# Patient Record
Sex: Female | Born: 1955
Health system: Southern US, Community
[De-identification: ages and names within clinical notes are randomized; demographics above are authoritative.]

## PROBLEM LIST (undated history)

## (undated) DIAGNOSIS — E079 Disorder of thyroid, unspecified: Secondary | ICD-10-CM

## (undated) DIAGNOSIS — K900 Celiac disease: Secondary | ICD-10-CM

## (undated) DIAGNOSIS — C50919 Malignant neoplasm of unspecified site of unspecified female breast: Secondary | ICD-10-CM

## (undated) DIAGNOSIS — Z9221 Personal history of antineoplastic chemotherapy: Secondary | ICD-10-CM

## (undated) DIAGNOSIS — R32 Unspecified urinary incontinence: Secondary | ICD-10-CM

## (undated) DIAGNOSIS — K5730 Diverticulosis of large intestine without perforation or abscess without bleeding: Secondary | ICD-10-CM

## (undated) DIAGNOSIS — K644 Residual hemorrhoidal skin tags: Secondary | ICD-10-CM

## (undated) DIAGNOSIS — F32A Depression, unspecified: Secondary | ICD-10-CM

## (undated) DIAGNOSIS — E039 Hypothyroidism, unspecified: Secondary | ICD-10-CM

## (undated) DIAGNOSIS — K901 Tropical sprue: Secondary | ICD-10-CM

## (undated) DIAGNOSIS — G629 Polyneuropathy, unspecified: Secondary | ICD-10-CM

## (undated) DIAGNOSIS — Z973 Presence of spectacles and contact lenses: Secondary | ICD-10-CM

## (undated) DIAGNOSIS — F419 Anxiety disorder, unspecified: Secondary | ICD-10-CM

## (undated) DIAGNOSIS — J189 Pneumonia, unspecified organism: Secondary | ICD-10-CM

## (undated) DIAGNOSIS — I82409 Acute embolism and thrombosis of unspecified deep veins of unspecified lower extremity: Secondary | ICD-10-CM

## (undated) DIAGNOSIS — F329 Major depressive disorder, single episode, unspecified: Secondary | ICD-10-CM

## (undated) DIAGNOSIS — E559 Vitamin D deficiency, unspecified: Secondary | ICD-10-CM

## (undated) HISTORY — DX: Diverticulosis of large intestine without perforation or abscess without bleeding: K57.30

## (undated) HISTORY — DX: Celiac disease: K90.0

## (undated) HISTORY — DX: Malignant neoplasm of unspecified site of unspecified female breast: C50.919

## (undated) HISTORY — DX: Disorder of thyroid, unspecified: E07.9

## (undated) HISTORY — DX: Acute embolism and thrombosis of unspecified deep veins of unspecified lower extremity: I82.409

## (undated) HISTORY — DX: Residual hemorrhoidal skin tags: K64.4

## (undated) HISTORY — PX: OTHER SURGICAL HISTORY: SHX169

## (undated) HISTORY — PX: BREAST LUMPECTOMY: SHX2

## (undated) HISTORY — DX: Depression, unspecified: F32.A

## (undated) HISTORY — DX: Tropical sprue: K90.1

## (undated) HISTORY — DX: Major depressive disorder, single episode, unspecified: F32.9

---

## 1998-04-15 ENCOUNTER — Ambulatory Visit (HOSPITAL_COMMUNITY): Admission: RE | Admit: 1998-04-15 | Discharge: 1998-04-15 | Payer: Self-pay | Admitting: Gynecology

## 1998-10-25 ENCOUNTER — Ambulatory Visit (HOSPITAL_COMMUNITY): Admission: RE | Admit: 1998-10-25 | Discharge: 1998-10-25 | Payer: Self-pay | Admitting: Internal Medicine

## 1998-10-25 ENCOUNTER — Encounter: Payer: Self-pay | Admitting: Internal Medicine

## 1999-12-12 ENCOUNTER — Ambulatory Visit (HOSPITAL_COMMUNITY): Admission: RE | Admit: 1999-12-12 | Discharge: 1999-12-12 | Payer: Self-pay | Admitting: *Deleted

## 1999-12-12 ENCOUNTER — Encounter: Payer: Self-pay | Admitting: *Deleted

## 2000-11-29 ENCOUNTER — Encounter: Admission: RE | Admit: 2000-11-29 | Discharge: 2000-11-29 | Payer: Self-pay | Admitting: Family Medicine

## 2000-11-29 ENCOUNTER — Encounter: Payer: Self-pay | Admitting: Family Medicine

## 2001-01-14 ENCOUNTER — Encounter: Admission: RE | Admit: 2001-01-14 | Discharge: 2001-01-14 | Payer: Self-pay | Admitting: Internal Medicine

## 2001-01-14 ENCOUNTER — Encounter: Payer: Self-pay | Admitting: Internal Medicine

## 2001-07-02 ENCOUNTER — Encounter: Payer: Self-pay | Admitting: Internal Medicine

## 2001-07-02 ENCOUNTER — Encounter: Admission: RE | Admit: 2001-07-02 | Discharge: 2001-07-02 | Payer: Self-pay | Admitting: Internal Medicine

## 2001-07-25 ENCOUNTER — Encounter: Admission: RE | Admit: 2001-07-25 | Discharge: 2001-07-25 | Payer: Self-pay | Admitting: Internal Medicine

## 2001-07-25 ENCOUNTER — Encounter: Payer: Self-pay | Admitting: Internal Medicine

## 2001-07-25 ENCOUNTER — Other Ambulatory Visit: Admission: RE | Admit: 2001-07-25 | Discharge: 2001-07-25 | Payer: Self-pay | Admitting: *Deleted

## 2001-07-25 ENCOUNTER — Encounter (INDEPENDENT_AMBULATORY_CARE_PROVIDER_SITE_OTHER): Payer: Self-pay

## 2001-08-18 ENCOUNTER — Encounter (INDEPENDENT_AMBULATORY_CARE_PROVIDER_SITE_OTHER): Payer: Self-pay | Admitting: *Deleted

## 2001-08-18 ENCOUNTER — Encounter: Payer: Self-pay | Admitting: Internal Medicine

## 2001-08-18 ENCOUNTER — Encounter: Admission: RE | Admit: 2001-08-18 | Discharge: 2001-08-18 | Payer: Self-pay | Admitting: Internal Medicine

## 2001-08-18 ENCOUNTER — Other Ambulatory Visit: Admission: RE | Admit: 2001-08-18 | Discharge: 2001-08-18 | Payer: Self-pay | Admitting: *Deleted

## 2001-09-15 ENCOUNTER — Encounter: Payer: Self-pay | Admitting: Internal Medicine

## 2001-09-15 ENCOUNTER — Encounter: Admission: RE | Admit: 2001-09-15 | Discharge: 2001-09-15 | Payer: Self-pay | Admitting: Internal Medicine

## 2002-01-15 ENCOUNTER — Encounter: Payer: Self-pay | Admitting: Internal Medicine

## 2002-01-15 ENCOUNTER — Encounter: Admission: RE | Admit: 2002-01-15 | Discharge: 2002-01-15 | Payer: Self-pay | Admitting: Internal Medicine

## 2002-04-23 ENCOUNTER — Other Ambulatory Visit: Admission: RE | Admit: 2002-04-23 | Discharge: 2002-04-23 | Payer: Self-pay | Admitting: Gynecology

## 2003-03-12 ENCOUNTER — Encounter (INDEPENDENT_AMBULATORY_CARE_PROVIDER_SITE_OTHER): Payer: Self-pay

## 2003-03-12 ENCOUNTER — Ambulatory Visit (HOSPITAL_COMMUNITY): Admission: AD | Admit: 2003-03-12 | Discharge: 2003-03-12 | Payer: Self-pay | Admitting: Obstetrics and Gynecology

## 2003-04-20 ENCOUNTER — Encounter: Payer: Self-pay | Admitting: Internal Medicine

## 2003-04-20 ENCOUNTER — Encounter: Admission: RE | Admit: 2003-04-20 | Discharge: 2003-04-20 | Payer: Self-pay | Admitting: Internal Medicine

## 2003-05-04 ENCOUNTER — Other Ambulatory Visit: Admission: RE | Admit: 2003-05-04 | Discharge: 2003-05-04 | Payer: Self-pay | Admitting: Obstetrics and Gynecology

## 2003-07-28 ENCOUNTER — Other Ambulatory Visit: Admission: RE | Admit: 2003-07-28 | Discharge: 2003-07-28 | Payer: Self-pay | Admitting: Obstetrics and Gynecology

## 2003-10-06 ENCOUNTER — Encounter: Admission: RE | Admit: 2003-10-06 | Discharge: 2003-10-06 | Payer: Self-pay | Admitting: Internal Medicine

## 2004-05-19 ENCOUNTER — Ambulatory Visit: Payer: Self-pay | Admitting: Internal Medicine

## 2004-07-16 HISTORY — PX: COLONOSCOPY: SHX174

## 2004-07-31 ENCOUNTER — Other Ambulatory Visit: Admission: RE | Admit: 2004-07-31 | Discharge: 2004-07-31 | Payer: Self-pay | Admitting: Obstetrics and Gynecology

## 2004-08-07 ENCOUNTER — Ambulatory Visit: Payer: Self-pay | Admitting: Internal Medicine

## 2004-08-17 ENCOUNTER — Ambulatory Visit: Payer: Self-pay | Admitting: Internal Medicine

## 2004-10-02 ENCOUNTER — Ambulatory Visit: Payer: Self-pay | Admitting: Internal Medicine

## 2004-10-09 ENCOUNTER — Ambulatory Visit: Payer: Self-pay | Admitting: Internal Medicine

## 2004-10-23 ENCOUNTER — Ambulatory Visit: Payer: Self-pay | Admitting: Internal Medicine

## 2004-12-12 ENCOUNTER — Ambulatory Visit: Payer: Self-pay | Admitting: Internal Medicine

## 2004-12-15 ENCOUNTER — Encounter: Admission: RE | Admit: 2004-12-15 | Discharge: 2004-12-15 | Payer: Self-pay | Admitting: Internal Medicine

## 2004-12-20 ENCOUNTER — Ambulatory Visit: Payer: Self-pay | Admitting: Internal Medicine

## 2004-12-20 HISTORY — PX: COLONOSCOPY: SHX174

## 2004-12-27 ENCOUNTER — Ambulatory Visit: Payer: Self-pay | Admitting: Internal Medicine

## 2005-02-05 ENCOUNTER — Ambulatory Visit: Payer: Self-pay | Admitting: Internal Medicine

## 2005-02-10 ENCOUNTER — Encounter: Payer: Self-pay | Admitting: Internal Medicine

## 2005-05-29 ENCOUNTER — Ambulatory Visit: Payer: Self-pay | Admitting: Internal Medicine

## 2005-05-31 ENCOUNTER — Ambulatory Visit: Payer: Self-pay | Admitting: Internal Medicine

## 2005-07-16 HISTORY — PX: ESOPHAGOGASTRODUODENOSCOPY: SHX1529

## 2005-08-09 ENCOUNTER — Ambulatory Visit: Payer: Self-pay | Admitting: Internal Medicine

## 2005-09-01 ENCOUNTER — Ambulatory Visit: Payer: Self-pay | Admitting: Family Medicine

## 2005-11-14 ENCOUNTER — Encounter: Payer: Self-pay | Admitting: Gynecology

## 2005-11-23 ENCOUNTER — Ambulatory Visit: Payer: Self-pay | Admitting: Internal Medicine

## 2005-12-06 ENCOUNTER — Ambulatory Visit: Payer: Self-pay | Admitting: Internal Medicine

## 2005-12-19 ENCOUNTER — Encounter: Admission: RE | Admit: 2005-12-19 | Discharge: 2005-12-19 | Payer: Self-pay | Admitting: Internal Medicine

## 2005-12-25 ENCOUNTER — Ambulatory Visit: Payer: Self-pay | Admitting: Internal Medicine

## 2006-01-09 ENCOUNTER — Ambulatory Visit: Payer: Self-pay | Admitting: Internal Medicine

## 2006-02-15 ENCOUNTER — Ambulatory Visit: Payer: Self-pay | Admitting: Internal Medicine

## 2006-02-21 ENCOUNTER — Ambulatory Visit: Payer: Self-pay | Admitting: Internal Medicine

## 2006-02-21 ENCOUNTER — Encounter: Payer: Self-pay | Admitting: Internal Medicine

## 2006-03-28 ENCOUNTER — Ambulatory Visit: Payer: Self-pay | Admitting: Internal Medicine

## 2006-04-01 ENCOUNTER — Ambulatory Visit: Payer: Self-pay | Admitting: Internal Medicine

## 2006-04-02 ENCOUNTER — Encounter: Admission: RE | Admit: 2006-04-02 | Discharge: 2006-07-01 | Payer: Self-pay | Admitting: Internal Medicine

## 2006-04-10 ENCOUNTER — Ambulatory Visit: Payer: Self-pay | Admitting: Internal Medicine

## 2006-06-12 ENCOUNTER — Ambulatory Visit: Payer: Self-pay | Admitting: Internal Medicine

## 2006-06-12 LAB — CONVERTED CEMR LAB: TSH: 2.94 microintl units/mL (ref 0.35–5.50)

## 2006-06-21 ENCOUNTER — Ambulatory Visit: Payer: Self-pay | Admitting: Internal Medicine

## 2006-07-26 ENCOUNTER — Ambulatory Visit: Payer: Self-pay | Admitting: Internal Medicine

## 2006-09-17 ENCOUNTER — Encounter: Payer: Self-pay | Admitting: Internal Medicine

## 2006-09-30 ENCOUNTER — Ambulatory Visit: Payer: Self-pay | Admitting: Internal Medicine

## 2006-09-30 LAB — CONVERTED CEMR LAB
ALT: 57 units/L — ABNORMAL HIGH (ref 0–40)
AST: 39 units/L — ABNORMAL HIGH (ref 0–37)
Albumin: 3.7 g/dL (ref 3.5–5.2)
Alkaline Phosphatase: 138 units/L — ABNORMAL HIGH (ref 39–117)
Bilirubin, Direct: 0.1 mg/dL (ref 0.0–0.3)
TSH: 1.56 microintl units/mL (ref 0.35–5.50)
Total Bilirubin: 0.5 mg/dL (ref 0.3–1.2)
Total Protein: 6.6 g/dL (ref 6.0–8.3)

## 2006-11-16 ENCOUNTER — Ambulatory Visit: Payer: Self-pay | Admitting: Family Medicine

## 2007-01-09 ENCOUNTER — Encounter: Admission: RE | Admit: 2007-01-09 | Discharge: 2007-01-09 | Payer: Self-pay | Admitting: Internal Medicine

## 2007-01-30 ENCOUNTER — Ambulatory Visit: Payer: Self-pay | Admitting: Internal Medicine

## 2007-01-31 DIAGNOSIS — F32A Depression, unspecified: Secondary | ICD-10-CM | POA: Insufficient documentation

## 2007-01-31 DIAGNOSIS — K900 Celiac disease: Secondary | ICD-10-CM | POA: Insufficient documentation

## 2007-01-31 DIAGNOSIS — F329 Major depressive disorder, single episode, unspecified: Secondary | ICD-10-CM

## 2007-01-31 DIAGNOSIS — E039 Hypothyroidism, unspecified: Secondary | ICD-10-CM | POA: Insufficient documentation

## 2007-01-31 DIAGNOSIS — R945 Abnormal results of liver function studies: Secondary | ICD-10-CM | POA: Insufficient documentation

## 2007-02-04 ENCOUNTER — Encounter: Payer: Self-pay | Admitting: *Deleted

## 2007-02-07 ENCOUNTER — Telehealth: Payer: Self-pay | Admitting: *Deleted

## 2007-02-13 LAB — CONVERTED CEMR LAB
ALT: 45 units/L — ABNORMAL HIGH (ref 0–35)
AST: 37 units/L (ref 0–37)
Albumin: 4 g/dL (ref 3.5–5.2)
Alkaline Phosphatase: 133 units/L — ABNORMAL HIGH (ref 39–117)
Bilirubin, Direct: 0.1 mg/dL (ref 0.0–0.3)
T3, Free: 3.8 pg/mL (ref 2.3–4.2)
T4, Total: 4.5 ug/dL — ABNORMAL LOW (ref 5.0–12.5)
TSH: 8 microintl units/mL — ABNORMAL HIGH (ref 0.35–5.50)
Total Bilirubin: 0.9 mg/dL (ref 0.3–1.2)
Total Protein: 7 g/dL (ref 6.0–8.3)

## 2007-04-11 ENCOUNTER — Encounter: Payer: Self-pay | Admitting: Internal Medicine

## 2007-04-30 ENCOUNTER — Encounter: Payer: Self-pay | Admitting: Internal Medicine

## 2007-05-05 ENCOUNTER — Ambulatory Visit: Payer: Self-pay | Admitting: Internal Medicine

## 2007-05-05 DIAGNOSIS — K5730 Diverticulosis of large intestine without perforation or abscess without bleeding: Secondary | ICD-10-CM

## 2007-05-05 DIAGNOSIS — K644 Residual hemorrhoidal skin tags: Secondary | ICD-10-CM

## 2007-05-05 HISTORY — DX: Diverticulosis of large intestine without perforation or abscess without bleeding: K57.30

## 2007-05-05 HISTORY — DX: Residual hemorrhoidal skin tags: K64.4

## 2007-05-05 LAB — CONVERTED CEMR LAB
ALT: 40 units/L — ABNORMAL HIGH (ref 0–35)
AST: 34 units/L (ref 0–37)
Albumin: 4 g/dL (ref 3.5–5.2)
Alkaline Phosphatase: 135 units/L — ABNORMAL HIGH (ref 39–117)
Basophils Absolute: 0 10*3/uL (ref 0.0–0.1)
Basophils Relative: 0.6 % (ref 0.0–1.0)
Bilirubin, Direct: 0.2 mg/dL (ref 0.0–0.3)
Eosinophils Absolute: 0.2 10*3/uL (ref 0.0–0.6)
Eosinophils Relative: 4.2 % (ref 0.0–5.0)
Free T4: 0.5 ng/dL — ABNORMAL LOW (ref 0.6–1.6)
HCT: 39.5 % (ref 36.0–46.0)
Hemoglobin: 13 g/dL (ref 12.0–15.0)
Lymphocytes Relative: 45.1 % (ref 12.0–46.0)
MCHC: 32.8 g/dL (ref 30.0–36.0)
MCV: 81.2 fL (ref 78.0–100.0)
Monocytes Absolute: 0.7 10*3/uL (ref 0.2–0.7)
Monocytes Relative: 11.3 % — ABNORMAL HIGH (ref 3.0–11.0)
Neutro Abs: 2.3 10*3/uL (ref 1.4–7.7)
Neutrophils Relative %: 38.8 % — ABNORMAL LOW (ref 43.0–77.0)
Platelets: 276 10*3/uL (ref 150–400)
RBC: 4.87 M/uL (ref 3.87–5.11)
RDW: 13.8 % (ref 11.5–14.6)
T3 Uptake Ratio: 36.3 % (ref 22.5–37.0)
TSH: 3.31 microintl units/mL (ref 0.35–5.50)
Total Bilirubin: 0.5 mg/dL (ref 0.3–1.2)
Total Protein: 7 g/dL (ref 6.0–8.3)
WBC: 5.9 10*3/uL (ref 4.5–10.5)

## 2007-07-30 ENCOUNTER — Ambulatory Visit: Payer: Self-pay | Admitting: Internal Medicine

## 2007-10-27 ENCOUNTER — Ambulatory Visit: Payer: Self-pay | Admitting: Internal Medicine

## 2007-10-27 LAB — CONVERTED CEMR LAB: TSH: 1.43 microintl units/mL (ref 0.35–5.50)

## 2007-11-03 ENCOUNTER — Ambulatory Visit: Payer: Self-pay | Admitting: Internal Medicine

## 2008-02-04 ENCOUNTER — Ambulatory Visit: Payer: Self-pay | Admitting: Internal Medicine

## 2008-02-04 LAB — CONVERTED CEMR LAB
ALT: 33 units/L (ref 0–35)
AST: 33 units/L (ref 0–37)
Albumin: 3.8 g/dL (ref 3.5–5.2)
Alkaline Phosphatase: 108 units/L (ref 39–117)
BUN: 11 mg/dL (ref 6–23)
Basophils Absolute: 0 10*3/uL (ref 0.0–0.1)
Basophils Relative: 0.5 % (ref 0.0–3.0)
Bilirubin, Direct: 0.1 mg/dL (ref 0.0–0.3)
CO2: 27 meq/L (ref 19–32)
Calcium: 9 mg/dL (ref 8.4–10.5)
Chloride: 108 meq/L (ref 96–112)
Cholesterol: 201 mg/dL (ref 0–200)
Creatinine, Ser: 0.7 mg/dL (ref 0.4–1.2)
Direct LDL: 133 mg/dL
Eosinophils Absolute: 0.1 10*3/uL (ref 0.0–0.7)
Eosinophils Relative: 3.4 % (ref 0.0–5.0)
GFR calc Af Amer: 113 mL/min
GFR calc non Af Amer: 93 mL/min
Glucose, Bld: 100 mg/dL — ABNORMAL HIGH (ref 70–99)
HCT: 37.2 % (ref 36.0–46.0)
HDL: 43.8 mg/dL (ref >39.0)
Hemoglobin: 12.4 g/dL (ref 12.0–15.0)
Lymphocytes Relative: 49.5 % — ABNORMAL HIGH (ref 12.0–46.0)
MCHC: 33.5 g/dL (ref 30.0–36.0)
MCV: 78.6 fL (ref 78.0–100.0)
Monocytes Absolute: 0.5 10*3/uL (ref 0.1–1.0)
Monocytes Relative: 12.3 % — ABNORMAL HIGH (ref 3.0–12.0)
Neutro Abs: 1.5 10*3/uL (ref 1.4–7.7)
Neutrophils Relative %: 34.3 % — ABNORMAL LOW (ref 43.0–77.0)
Platelets: 238 10*3/uL (ref 150–400)
Potassium: 4 meq/L (ref 3.5–5.1)
RBC: 4.72 M/uL (ref 3.87–5.11)
RDW: 16.4 % — ABNORMAL HIGH (ref 11.5–14.6)
Sodium: 140 meq/L (ref 135–145)
TSH: 4.38 microintl units/mL (ref 0.35–5.50)
Total Bilirubin: 0.8 mg/dL (ref 0.3–1.2)
Total CHOL/HDL Ratio: 4.6
Total Protein: 6.7 g/dL (ref 6.0–8.3)
Triglycerides: 89 mg/dL (ref 0–149)
VLDL: 18 mg/dL (ref 0–40)
WBC: 4.3 10*3/uL — ABNORMAL LOW (ref 4.5–10.5)

## 2008-02-11 ENCOUNTER — Ambulatory Visit: Payer: Self-pay | Admitting: Internal Medicine

## 2008-05-06 ENCOUNTER — Ambulatory Visit: Payer: Self-pay | Admitting: Internal Medicine

## 2008-05-06 LAB — CONVERTED CEMR LAB
ALT: 27 units/L (ref 0–35)
AST: 27 units/L (ref 0–37)
Albumin: 4.1 g/dL (ref 3.5–5.2)
Alkaline Phosphatase: 105 units/L (ref 39–117)
Bilirubin, Direct: 0.1 mg/dL (ref 0.0–0.3)
Cholesterol: 188 mg/dL (ref 0–200)
HDL: 45.3 mg/dL (ref >39.0)
LDL Cholesterol: 129 mg/dL — ABNORMAL HIGH (ref 0–99)
Total Bilirubin: 0.9 mg/dL (ref 0.3–1.2)
Total CHOL/HDL Ratio: 4.2
Total Protein: 7.1 g/dL (ref 6.0–8.3)
Triglycerides: 67 mg/dL (ref 0–149)
VLDL: 13 mg/dL (ref 0–40)

## 2008-05-13 ENCOUNTER — Ambulatory Visit: Payer: Self-pay | Admitting: Internal Medicine

## 2008-05-13 DIAGNOSIS — G4700 Insomnia, unspecified: Secondary | ICD-10-CM | POA: Insufficient documentation

## 2008-05-13 DIAGNOSIS — E785 Hyperlipidemia, unspecified: Secondary | ICD-10-CM | POA: Insufficient documentation

## 2008-08-10 ENCOUNTER — Ambulatory Visit: Payer: Self-pay | Admitting: Internal Medicine

## 2008-08-10 LAB — CONVERTED CEMR LAB
Free T4: 0.5 ng/dL — ABNORMAL LOW (ref 0.6–1.6)
T3, Free: 3.6 pg/mL (ref 2.3–4.2)
TSH: 2.06 microintl units/mL (ref 0.35–5.50)

## 2008-08-19 ENCOUNTER — Ambulatory Visit: Payer: Self-pay | Admitting: Internal Medicine

## 2008-08-19 DIAGNOSIS — N3000 Acute cystitis without hematuria: Secondary | ICD-10-CM | POA: Insufficient documentation

## 2008-08-19 LAB — CONVERTED CEMR LAB
Bilirubin Urine: NEGATIVE
Glucose, Urine, Semiquant: NEGATIVE
Ketones, urine, test strip: NEGATIVE
Nitrite: POSITIVE
Specific Gravity, Urine: 1.02
Urobilinogen, UA: 0.2
pH: 7

## 2008-09-02 ENCOUNTER — Encounter: Payer: Self-pay | Admitting: Internal Medicine

## 2009-03-08 ENCOUNTER — Ambulatory Visit: Payer: Self-pay | Admitting: Internal Medicine

## 2009-03-08 ENCOUNTER — Telehealth: Payer: Self-pay | Admitting: Internal Medicine

## 2009-03-08 DIAGNOSIS — J019 Acute sinusitis, unspecified: Secondary | ICD-10-CM | POA: Insufficient documentation

## 2009-06-03 ENCOUNTER — Telehealth: Payer: Self-pay | Admitting: Internal Medicine

## 2009-11-28 ENCOUNTER — Ambulatory Visit: Payer: Self-pay | Admitting: Family Medicine

## 2009-11-29 ENCOUNTER — Telehealth: Payer: Self-pay | Admitting: Internal Medicine

## 2009-11-29 LAB — CONVERTED CEMR LAB
Basophils Absolute: 0 10*3/uL (ref 0.0–0.1)
Basophils Relative: 0.3 % (ref 0.0–3.0)
Eosinophils Absolute: 0 10*3/uL (ref 0.0–0.7)
Eosinophils Relative: 0.2 % (ref 0.0–5.0)
HCT: 37.9 % (ref 36.0–46.0)
Hemoglobin: 12.6 g/dL (ref 12.0–15.0)
Lymphocytes Relative: 19.1 % (ref 12.0–46.0)
Lymphs Abs: 1.6 10*3/uL (ref 0.7–4.0)
MCHC: 33.1 g/dL (ref 30.0–36.0)
MCV: 77.4 fL — ABNORMAL LOW (ref 78.0–100.0)
Monocytes Absolute: 1.2 10*3/uL — ABNORMAL HIGH (ref 0.1–1.0)
Monocytes Relative: 13.9 % — ABNORMAL HIGH (ref 3.0–12.0)
Neutro Abs: 5.6 10*3/uL (ref 1.4–7.7)
Neutrophils Relative %: 66.5 % (ref 43.0–77.0)
Platelets: 265 10*3/uL (ref 150.0–400.0)
RBC: 4.9 M/uL (ref 3.87–5.11)
RDW: 16.7 % — ABNORMAL HIGH (ref 11.5–14.6)
WBC: 8.4 10*3/uL (ref 4.5–10.5)

## 2009-12-01 ENCOUNTER — Telehealth: Payer: Self-pay | Admitting: Internal Medicine

## 2009-12-02 ENCOUNTER — Encounter: Payer: Self-pay | Admitting: Internal Medicine

## 2009-12-05 ENCOUNTER — Encounter: Payer: Self-pay | Admitting: Internal Medicine

## 2009-12-05 DIAGNOSIS — R05 Cough: Secondary | ICD-10-CM

## 2009-12-05 DIAGNOSIS — R059 Cough, unspecified: Secondary | ICD-10-CM | POA: Insufficient documentation

## 2009-12-06 ENCOUNTER — Ambulatory Visit: Payer: Self-pay | Admitting: Internal Medicine

## 2009-12-06 ENCOUNTER — Telehealth: Payer: Self-pay | Admitting: Internal Medicine

## 2009-12-07 ENCOUNTER — Telehealth: Payer: Self-pay | Admitting: Internal Medicine

## 2010-02-10 ENCOUNTER — Telehealth: Payer: Self-pay | Admitting: Internal Medicine

## 2010-03-15 ENCOUNTER — Telehealth: Payer: Self-pay | Admitting: Internal Medicine

## 2010-08-15 NOTE — Assessment & Plan Note (Signed)
Summary: uri/bmw   Vital Signs:  Patient profile:   55 year old female Height:      67 inches Weight:      145 pounds BMI:     22.79 Temp:     99.1 degrees F oral Pulse rate:   72 / minute Resp:     14 per minute BP sitting:   110 / 70  (left arm)  Vitals Entered By: Willy Eddy, LPN (March 08, 2009 4:25 PM)  CC:  c/o pnd, .myalgia, fever, sore throat, and URI symptoms.  History of Present Illness:  URI Symptoms      This is a 55 year old woman who presents with URI symptoms.  The patient reports purulent nasal discharge, productive cough, and earache, but denies nasal congestion, clear nasal discharge, sore throat, dry cough, and sick contacts.  Associated symptoms include low-grade fever (<100.5 degrees).  The patient also reports headache.  The patient denies the following risk factors for Strep sinusitis: unilateral facial pain, unilateral nasal discharge, poor response to decongestant, double sickening, tooth pain, Strep exposure, tender adenopathy, and absence of cough.     she had a prior sinus infection 6 months ago and was treated with levoquin . Thant chart was reviewed.   Problems Prior to Update: 1)  Cystitis, Acute  (ICD-595.0) 2)  Insomnia, Chronic, Mild  (ICD-307.42) 3)  Hyperlipidemia  (ICD-272.4) 4)  Preventive Health Care  (ICD-V70.0) 5)  Family History Diabetes 1st Degree Relative  (ICD-V18.0) 6)  Hemorrhoids, External  (ICD-455.3) 7)  Diverticulosis, Colon  (ICD-562.10) 8)  Sprue  (ICD-579.1) 9)  Depression  (ICD-311) 10)  Liver Function Tests, Abnormal  (ICD-794.8) 11)  Hypothyroidism  (ICD-244.9)  Medications Prior to Update: 1)  Lexapro 10 Mg  Tabs (Escitalopram Oxalate) .... Once Daily 2)  Synthroid 25 Mcg  Tabs (Levothyroxine Sodium) .... One By Mouth Daily With 1/2 Cytomil 25 Mg Tablet 3)  Cytomel 25 Mcg  Tabs (Liothyronine Sodium) .... One Half By Mouth Daily With Sythoid 4)  Levaquin 750 Mg Tabs (Levofloxacin) .... One By Mouth Dialy For  3 Days  Current Medications (verified): 1)  Synthroid 25 Mcg  Tabs (Levothyroxine Sodium) .... One By Mouth Daily With 1/2 Cytomil 25 Mg Tablet 2)  Cytomel 25 Mcg  Tabs (Liothyronine Sodium) .... One Half By Mouth Daily With Sythoid 3)  Clarithromycin 500 Mg Xr24h-Tab (Clarithromycin) .... One By Mouth Two Times A Day  For 10 Days 4)  Claritin-D 12 Hour 5-120 Mg Xr12h-Tab (Loratadine-Pseudoephedrine) .... One By Mouth Two Times A Day For 10 Days  Allergies (verified): No Known Drug Allergies  Past History:  Family History: Last updated: 05/05/2007 hypothyroidism Family History Diabetes 1st degree relative  Social History: Last updated: 05/05/2007 Married Never Smoked Alcohol use-no Drug use-no  Risk Factors: Smoking Status: never (05/05/2007) Passive Smoke Exposure: no (07/30/2007)  Past medical, surgical, family and social histories (including risk factors) reviewed, and no changes noted (except as noted below).  Past Medical History: Reviewed history from 05/13/2008 and no changes required. Hypothyroidism Depression sprue Diverticulosis, colon Hyperlipidemia  Past Surgical History: Reviewed history from 05/05/2007 and no changes required. EDG   2007   sprue Colon 2006 GYN surgery cryoablation of cervix Lumpectomy  Family History: Reviewed history from 05/05/2007 and no changes required. hypothyroidism Family History Diabetes 1st degree relative  Social History: Reviewed history from 05/05/2007 and no changes required. Married Never Smoked Alcohol use-no Drug use-no  Review of Systems  The patient denies anorexia,  fever, weight loss, weight gain, vision loss, decreased hearing, hoarseness, chest pain, syncope, dyspnea on exertion, peripheral edema, prolonged cough, headaches, hemoptysis, abdominal pain, melena, hematochezia, severe indigestion/heartburn, hematuria, incontinence, genital sores, muscle weakness, suspicious skin lesions, transient blindness,  difficulty walking, depression, unusual weight change, abnormal bleeding, enlarged lymph nodes, angioedema, breast masses, and testicular masses.    Physical Exam  General:  alert.  well-developed.   Head:  Normocephalic and atraumatic without obvious abnormalities. No apparent alopecia or balding. Eyes:  pupils equal and pupils round.   Ears:  R ear normal and L ear normal.   Nose:  nasal dischargemucosal pallor, mucosal erythema, and mucosal edema.  mucosal edema.   Mouth:  good dentition.  pharynx pink and moist.   Neck:  supple and full ROM.   Lungs:  Normal respiratory effort, chest expands symmetrically. Lungs are clear to auscultation, no crackles or wheezes. Heart:  normal rate and regular rhythm.   Abdomen:  Bowel sounds positive,abdomen soft and non-tender without masses, organomegaly or hernias noted. Neurologic:  alert & oriented X3 and finger-to-nose normal.     Impression & Recommendations:  Problem # 1:  ACUTE SINUSITIS, UNSPECIFIED (ICD-461.9)  The following medications were removed from the medication list:    Levaquin 750 Mg Tabs (Levofloxacin) ..... One by mouth dialy for 3 days Her updated medication list for this problem includes:    Clarithromycin 500 Mg Xr24h-tab (Clarithromycin) ..... One by mouth two times a day  for 10 days    Claritin-d 12 Hour 5-120 Mg Xr12h-tab (Loratadine-pseudoephedrine) ..... One by mouth two times a day for 10 days  Instructed on treatment. Call if symptoms persist or worsen.   Complete Medication List: 1)  Synthroid 25 Mcg Tabs (Levothyroxine sodium) .... One by mouth daily with 1/2 cytomil 25 mg tablet 2)  Cytomel 25 Mcg Tabs (Liothyronine sodium) .... One half by mouth daily with sythoid 3)  Clarithromycin 500 Mg Xr24h-tab (Clarithromycin) .... One by mouth two times a day  for 10 days 4)  Claritin-d 12 Hour 5-120 Mg Xr12h-tab (Loratadine-pseudoephedrine) .... One by mouth two times a day for 10 days  Patient Instructions: 1)   Acute sinusitis symptoms for less than 10 days are not helped by antibiotics.Use warm moist compresses, and over the counter decongestants ( only as directed). Call if no improvement in 5-7 days, sooner if increasing pain, fever, or new symptoms. Prescriptions: CLARITIN-D 12 HOUR 5-120 MG XR12H-TAB (LORATADINE-PSEUDOEPHEDRINE) one by mouth two times a day for 10 days  #20 x 0   Entered and Authorized by:   Stacie Glaze MD   Signed by:   Stacie Glaze MD on 03/08/2009   Method used:   Electronically to        [facility]  [phone]* (retail)       [address]       [place], [place]  [address]       Ph: [phone] or [phone]       Fax: [phone]   RxID:   [id number] CLARITHROMYCIN 500 MG XR24H-TAB (CLARITHROMYCIN) one by mouth two times a day  for 10 days  #20 x 0   Entered and Authorized by:   Stacie Glaze MD   Signed by:   Stacie Glaze MD on 03/08/2009   Method used:   Electronically to        [facility]  [phone]* (retail)  [address]       [place], [place]  [address]       Ph: [phone] or [phone]       Fax: [phone]   RxID:   [id number] CYTOMEL 25 MCG  TABS (LIOTHYRONINE SODIUM) one half by mouth daily with sythoid  #30 Tablet x 10   Entered by:   Willy Eddy, LPN   Authorized by:   Stacie Glaze MD   Signed by:   Willy Eddy, LPN on 27/25/3664   Method used:   Electronically to        [facility]  [phone]* (retail)       [address]       [place], [place]  [address]       Ph: [phone] or [phone]       Fax: [phone]   RxID:   [id number] SYNTHROID 25 MCG  TABS (LEVOTHYROXINE SODIUM) one by mouth daily with 1/2 cytomil 25 mg tablet  #30 Tablet x 10   Entered by:   Willy Eddy, LPN   Authorized by:   Stacie Glaze MD   Signed by:   Willy Eddy, LPN on 62/37/6283   Method used:    Electronically to        [facility]  [phone]* (retail)       [address]       [place], [place]  [address]       Ph: [phone] or [phone]       Fax: [phone]   RxID:   [id number]

## 2010-08-15 NOTE — Assessment & Plan Note (Signed)
Summary: FEVER, COUGH AND ACHES//SLM   Vital Signs:  Patient profile:   55 year old female O2 Sat:      95 % on Room air Temp:     101.6 degrees F oral Pulse rate:   112 / minute Pulse rhythm:   regular BP sitting:   110 / 60  (left arm) Cuff size:   regular  Vitals Entered By: Sid Falcon LPN (Nov 28, 2009 11:39 AM)  O2 Flow:  Room air CC: Fever X 5 days, cough, body aches   History of Present Illness: Patient seen with onset last Thursday of low-grade fever, body aches, and cough. Cough and fever have progressed up to 101.6 today.  Has some mild dyspnea with activity occasional right upper lobe lung pain anteriorly. Some nasal congestion and sore throat symptoms. No ill contacts. Taken Tylenol and increased fluids and rest over the weekend.  No history of chronic lung disease. Nonsmoker. No known drug allergies. Denies nausea, vomiting, or diarrhea  Allergies (verified): No Known Drug Allergies  Past History:  Past Medical History: Last updated: 05/13/2008 Hypothyroidism Depression sprue Diverticulosis, colon Hyperlipidemia  Review of Systems      See HPI  Physical Exam  General:  Well-developed,well-nourished,in no acute distress; alert,appropriate and cooperative throughout examination Head:  Normocephalic and atraumatic without obvious abnormalities. No apparent alopecia or balding. Ears:  External ear exam shows no significant lesions or deformities.  Otoscopic examination reveals clear canals, tympanic membranes are intact bilaterally without bulging, retraction, inflammation or discharge. Hearing is grossly normal bilaterally. Nose:  External nasal examination shows no deformity or inflammation. Nasal mucosa are pink and moist without lesions or exudates. Mouth:  Oral mucosa and oropharynx without lesions or exudates.  Teeth in good repair. Neck:  No deformities, masses, or tenderness noted. Lungs:  clear to auscultation anterior and posterior. No rales. No  retractions. No wheezes. Heart:  Normal rate and regular rhythm. S1 and S2 normal without gallop, murmur, click, rub or other extra sounds. Skin:  no rashes.     Impression & Recommendations:  Problem # 1:  COUGH (ICD-786.2)  my concern is that she has progressive fevers 5 days into illness. No evidence clinically for pneumonia by auscultation. We'll check CBC and start Zithromax. Consider chest x-ray if worsening in any way.  Orders: Venipuncture ([address]) TLB-CBC Platelet - w/Differential (85025-CBCD)  Complete Medication List: 1)  Synthroid 25 Mcg Tabs (Levothyroxine sodium) .... One by mouth daily with 1/2 cytomil 25 mg tablet 2)  Cytomel 25 Mcg Tabs (Liothyronine sodium) .... One half by mouth daily with sythoid 3)  Azithromycin 250 Mg Tabs (Azithromycin) .... 2 by mouth today then one by mouth once daily for 4 days 4)  Claritin-d 12 Hour 5-120 Mg Xr12h-tab (Loratadine-pseudoephedrine) .... One by mouth two times a day for 10 days 5)  Hydrocodone-homatropine 5-1.5 Mg/40ml Syrp (Hydrocodone-homatropine) .... One tsp by mouth q 4-6 hours as needed cough  Patient Instructions: 1)  followup promptly if you have any increased shortness of breath or any progressive fevers. Prescriptions: HYDROCODONE-HOMATROPINE 5-1.5 MG/5ML SYRP (HYDROCODONE-HOMATROPINE) one tsp by mouth q 4-6 hours as needed cough  #120 ml x 0   Entered and Authorized by:   Evelena Peat MD   Signed by:   Evelena Peat MD on 11/28/2009   Method used:   Print then Give to Patient   RxID:   [id number] AZITHROMYCIN 250 MG TABS (AZITHROMYCIN) 2 by mouth today then one by mouth once daily for 4 days  #  6 x 0   Entered and Authorized by:   Evelena Peat MD   Signed by:   Evelena Peat MD on 11/28/2009   Method used:   Electronically to        [facility]  [phone]* (retail)       [address]       [place], [place]  [address]       Ph: [phone] or [phone]        Fax: [phone]   RxID:   [id number]

## 2010-08-15 NOTE — Letter (Signed)
Summary: [facility]  [facility]   Imported By: Maryln Gottron 12/07/2009 14:24:05  _____________________________________________________________________  External Attachment:    Type:   Image     Comment:   External Document

## 2010-08-15 NOTE — Progress Notes (Signed)
Summary: REFILL REQUEST  Phone Note Refill Request Message from:  Patient on February 10, 2010 4:58 PM  Refills Requested: Medication #1:  SYNTHROID 25 MCG  TABS one by mouth daily with 1/2 cytomil 25 mg tablet   Notes: [facility] /    [phone] Initial call taken by: Debbra Riding,  February 10, 2010 5:00 PM    Prescriptions: SYNTHROID 25 MCG  TABS (LEVOTHYROXINE SODIUM) one by mouth daily with 1/2 cytomil 25 mg tablet  #30 Tablet x 0   Entered by:   Lynann Beaver CMA   Authorized by:   Stacie Glaze MD   Signed by:   Lynann Beaver CMA on 02/13/2010   Method used:   Electronically to        [facility] [address] (retail)       [phone]       [place], [place]  [address]       Ph: [phone]       Fax: [phone]   RxID:   [id number]

## 2010-08-15 NOTE — Assessment & Plan Note (Signed)
Summary: 3 month rov/njr   Vital Signs:  Patient Profile:   55 Years Old Female Height:     67 inches Weight:      146 pounds Temp:     98.5 degrees F oral Pulse rate:   76 / minute Resp:     14 per minute BP sitting:   124 / 80  (left arm)  Vitals Entered By: Willy Eddy, LPN (May 13, 2008 8:48 AM)                 Chief Complaint:  roa labs- c/o sore throat and laryngitis and URI symptoms.  History of Present Illness:  Hyperlipidemia Follow-Up      This is a 55 year old woman who presents for Hyperlipidemia follow-up.  The patient denies muscle aches, GI upset, abdominal pain, flushing, itching, constipation, diarrhea, and fatigue.  The patient denies the following symptoms: chest pain/pressure, exercise intolerance, dypsnea, palpitations, syncope, and pedal edema.  Compliance with medications (by patient report) has been near 100%.  Dietary compliance has been excellent.  The patient reports exercising daily.  Adjunctive measures currently used by the patient include fish oil supplements.    URI Symptoms      The patient also presents with URI symptoms.  The patient reports clear nasal discharge, sore throat, and dry cough.  Associated symptoms include low-grade fever (<100.5 degrees).  The patient also reports headache and muscle aches.  Risk factors for Strep sinusitis include tooth pain.      Current Allergies: No known allergies   Past Medical History:    Reviewed history from 05/05/2007 and no changes required:       Hypothyroidism       Depression       sprue       Diverticulosis, colon       Hyperlipidemia  Past Surgical History:    Reviewed history from 05/05/2007 and no changes required:       EDG   2007   sprue       Colon 2006       GYN surgery cryoablation of cervix       Lumpectomy   Family History:    Reviewed history from 05/05/2007 and no changes required:       hypothyroidism       Family History Diabetes 1st degree  relative  Social History:    Reviewed history from 05/05/2007 and no changes required:       Married       Never Smoked       Alcohol use-no       Drug use-no    Review of Systems  The patient denies anorexia, fever, weight loss, weight gain, vision loss, decreased hearing, hoarseness, chest pain, syncope, dyspnea on exertion, peripheral edema, prolonged cough, headaches, hemoptysis, abdominal pain, melena, hematochezia, severe indigestion/heartburn, hematuria, incontinence, genital sores, muscle weakness, suspicious skin lesions, transient blindness, difficulty walking, depression, unusual weight change, abnormal bleeding, enlarged lymph nodes, angioedema, and breast masses.     Physical Exam  General:     alert.  well-developed.   Head:     normocephalic and atraumatic.   Eyes:     pupils equal and pupils round.   Nose:     nasal dischargemucosal pallor, mucosal erythema, and mucosal edema.   Mouth:     good dentition.  pharynx pink and moist.   Neck:     supple and full ROM.   Lungs:  normal respiratory effort and no intercostal retractions.   Heart:     normal rate and regular rhythm.   Abdomen:     Bowel sounds positive,abdomen soft and non-tender without masses, organomegaly or hernias noted.    Impression & Recommendations:  Problem # 1:  SPRUE (ICD-579.1) Assessment: Unchanged stable LFT's  Problem # 2:  HYPOTHYROIDISM (ICD-244.9)  Her updated medication list for this problem includes:    Synthroid 25 Mcg Tabs (Levothyroxine sodium) ..... One by mouth daily with 1/2 cytomil 25 mg tablet    Cytomel 25 Mcg Tabs (Liothyronine sodium) ..... One half by mouth daily with sythoid  Labs Reviewed: TSH: 4.38 (02/04/2008)   Free T4: 0.5 (05/05/2007)    Chol: 201 (02/04/2008)   HDL: 43.8 (02/04/2008)   LDL: DEL (02/04/2008)   TG: 89 (02/04/2008)   Problem # 3:  HYPERLIPIDEMIA (ICD-272.4) Assessment: Improved diet modifications Labs Reviewed: Chol: 201  (02/04/2008)   HDL: 43.8 (02/04/2008)   LDL: DEL (02/04/2008)   TG: 89 (02/04/2008) SGOT: 33 (02/04/2008)   SGPT: 33 (02/04/2008)   Problem # 4:  INSOMNIA, CHRONIC, MILD (ICD-307.42)  Complete Medication List: 1)  Lexapro 10 Mg Tabs (Escitalopram oxalate) .... Once daily 2)  Synthroid 25 Mcg Tabs (Levothyroxine sodium) .... One by mouth daily with 1/2 cytomil 25 mg tablet 3)  Cytomel 25 Mcg Tabs (Liothyronine sodium) .... One half by mouth daily with sythoid 4)  Restoril 30 Mg Caps (Temazepam) .... One by mouth q hs   Patient Instructions: 1)  Please schedule a follow-up appointment in 3 months.   Prescriptions: RESTORIL 30 MG CAPS (TEMAZEPAM) one by mouth q HS  #30 x 11   Entered and Authorized by:   Stacie Glaze MD   Signed by:   Stacie Glaze MD on 05/13/2008   Method used:   Print then Give to Patient   RxID:   [id number]  ]

## 2010-08-15 NOTE — Assessment & Plan Note (Signed)
Summary: CPX/NOPAP/NJR   Vital Signs:  Patient Profile:   55 Years Old Female Height:     67 inches Weight:      150 pounds Temp:     98.2 degrees F oral Pulse rate:   76 / minute Resp:     14 per minute BP sitting:   124 / 74  (left arm)  Vitals Entered By: Willy Eddy, LPN (February 11, 2008 8:55 AM)                 Chief Complaint:  cpx-dt in 2001-yearly gyn with dr anderson/colon in 2006.    Current Allergies: No known allergies   Past Medical History:    Reviewed history from 05/05/2007 and no changes required:       Hypothyroidism       Depression       sprue       Diverticulosis, colon  Past Surgical History:    Reviewed history from 05/05/2007 and no changes required:       EDG   2007   sprue       Colon 2006       GYN surgery cryoablation of cervix       Lumpectomy   Family History:    Reviewed history from 05/05/2007 and no changes required:       hypothyroidism       Family History Diabetes 1st degree relative  Social History:    Reviewed history from 05/05/2007 and no changes required:       Married       Never Smoked       Alcohol use-no       Drug use-no    Review of Systems  The patient denies anorexia, fever, weight loss, weight gain, vision loss, decreased hearing, hoarseness, chest pain, syncope, dyspnea on exertion, peripheral edema, prolonged cough, headaches, hemoptysis, abdominal pain, melena, hematochezia, severe indigestion/heartburn, hematuria, incontinence, genital sores, muscle weakness, suspicious skin lesions, transient blindness, difficulty walking, depression, unusual weight change, abnormal bleeding, enlarged lymph nodes, angioedema, and breast masses.     Physical Exam  General:     alert.   Head:     normocephalic and atraumatic.   Eyes:     pupils equal and pupils round.   Ears:     R ear normal and L ear normal.   Nose:     no external deformity.   Mouth:     good dentition.  pharynx pink and moist.     Neck:     No deformities, masses, or tenderness noted. Chest Wall:     No deformities, masses, or tenderness noted. Lungs:     normal respiratory effort.  no wheezes.   Heart:     normal rate, regular rhythm, and no murmur.   Abdomen:     Bowel sounds positive,abdomen soft and non-tender without masses, organomegaly or hernias noted. Msk:     normal ROM, no joint tenderness, and no joint swelling.   Pulses:     R and L carotid,radial,femoral,dorsalis pedis and posterior tibial pulses are full and equal bilaterally Extremities:     No clubbing, cyanosis, edema, or deformity noted with normal full range of motion of all joints.   Neurologic:     alert & oriented X3.      Impression & Recommendations:  Problem # 1:  LIVER FUNCTION TESTS, ABNORMAL (ICD-794.8) NORMALAdvised patient to avoid alcohol and Tylenol. Call for worsening symptoms.   Problem #  2:  SPRUE (ICD-579.1) STABLE  DIET WITH GLUTEN AVOIDANCE  Problem # 3:  HYPOTHYROIDISM (ICD-244.9)  Her updated medication list for this problem includes:    Synthroid 25 Mcg Tabs (Levothyroxine sodium) ..... One by mouth daily with 1/2 cytomil 25 mg tablet    Cytomel 25 Mcg Tabs (Liothyronine sodium) ..... One half by mouth daily with sythoid  Labs Reviewed: TSH: 4.38 (02/04/2008)   Free T4: 0.5 (05/05/2007)    Chol: 201 (02/04/2008)   HDL: 43.8 (02/04/2008)   LDL: DEL (02/04/2008)   TG: 89 (02/04/2008)   Problem # 4:  PREVENTIVE HEALTH CARE (ICD-V70.0) CONSISTANT FLIP OF NEUTRAPHIL AND LYMPHS PROBABLY DUE TO GENETICS   Problem # 5:  HYPOTHYROIDISM (ICD-244.9)  Her updated medication list for this problem includes:    Synthroid 25 Mcg Tabs (Levothyroxine sodium) ..... One by mouth daily with 1/2 cytomil 25 mg tablet    Cytomel 25 Mcg Tabs (Liothyronine sodium) ..... One half by mouth daily with sythoid  Labs Reviewed: TSH: 4.38 (02/04/2008)   Free T4: 0.5 (05/05/2007)    Chol: 201 (02/04/2008)   HDL: 43.8 (02/04/2008)    LDL: DEL (02/04/2008)   TG: 89 (02/04/2008)   Complete Medication List: 1)  Lexapro 10 Mg Tabs (Escitalopram oxalate) .... Once daily 2)  Synthroid 25 Mcg Tabs (Levothyroxine sodium) .... One by mouth daily with 1/2 cytomil 25 mg tablet 3)  Cytomel 25 Mcg Tabs (Liothyronine sodium) .... One half by mouth daily with sythoid   Patient Instructions: 1)  Please schedule a follow-up appointment in 3 months. 2)  Hepatic Panel prior to visit, ICD-9: 3)  Lipid Panel prior to visit, ICD-9:   ]

## 2010-08-15 NOTE — Progress Notes (Signed)
Summary: out of work  Phone Note Call from Patient   Summary of Call: Letter out of date needs to be updated to say out from May 16 to 24.  Wants to come this pm after 4:30.   Initial call taken by: Rudy Jew, RN,  Dec 07, 2009 2:54 PM  Follow-up for Phone Call        done Follow-up by: Willy Eddy, LPN,  Dec 07, 2009 3:10 PM

## 2010-08-15 NOTE — Letter (Signed)
Summary: Generic Letter  [facility]  [address] [place], [place] [address]   Phone: [phone]  Fax: [phone]    12/02/2009  BAYLEN DEA [address] [place], [place]  [address]     Please Be Advised:   Please excuse Harriette from work for the week May 16-May 22,2011.  She was seen in out office and needed to be out of work to recover.     Sincerely,  Dr. Darryll Capers

## 2010-08-15 NOTE — Letter (Signed)
Summary: Generic Letter  [place]  [address] [place], [place] [address]   Phone: [phone]  Fax: [phone]    12/02/2009  Nancy Beard [address] [place], [place]  [address]  Dear Ms. Lowman,           Sincerely,   Melchor Amour, LPN

## 2010-08-15 NOTE — Consult Note (Signed)
Summary: [facility] - Dermatology  [facility] - Dermatology   Imported By: Maryln Gottron 10/17/2007 13:46:05  _____________________________________________________________________  External Attachment:    Type:   Image     Comment:   External Document

## 2010-08-15 NOTE — Progress Notes (Signed)
Summary: update  Phone Note Call from Patient Call back at Work Phone [phone]   Caller: Patient Call For: Stacie Glaze MD Summary of Call: Pt is calling to give [place] an update.  She is "a little" better........no fever today, but still congested. [phone] Initial call taken by: Lynann Beaver CMA,  Dec 01, 2009 10:46 AM  Follow-up for Phone Call        talked with pt - still congested-instructed to completed z pack tomorrow and call us back with report Follow-up by: Willy Eddy, LPN,  Dec 01, 2009 12:59 PM     Appended Document: update Pt is better today, but still congested.  Needs a [facility] note to cover her this whole week, please. [phone]  Appended Document: update letter out front for pt  Appended Document: update Pt is calling stating she is getting a little better everyday, but still ill and at home.  Congestion continues to be the mail problem with cough.  No fever. [phone]  Appended Document: update Per Dr. Lovell Sheehan....get chest xray. Pt will go tomorrow.

## 2010-08-15 NOTE — Assessment & Plan Note (Signed)
Summary: roa/db      Allergies Added: NKDA  Vital Signs:  Patient Profile:   54 Years Old Female Height:     67 inches Weight:      158 pounds Temp:     98.5 degrees F oral Pulse rate:   76 / minute Resp:     14 per minute BP sitting:   110 / 70  (left arm)  Vitals Entered By: Willy Eddy, LPN (May 05, 2007 8:43 AM)                 Chief Complaint:  f/u labs.  History of Present Illness: Follow up of sprue Discovered by elevated LFT's and then confirmed by GI Following gluten free diet. Fasting for labs. Mood is OK  Current Allergies: No known allergies  Updated/Current Medications (including changes made in today's visit):  LEXAPRO 10 MG  TABS (ESCITALOPRAM OXALATE) once daily SYNTHROID 25 MCG  TABS (LEVOTHYROXINE SODIUM) one by mouth daily with 1/2 cytomil 25 mg tablet CYTOMEL 25 MCG  TABS (LIOTHYRONINE SODIUM) one half by mouth daily with sythoid   Past Medical History:    Reviewed history from 01/31/2007 and no changes required:       Hypothyroidism       Depression       sprue       Diverticulosis, colon  Past Surgical History:    Reviewed history and no changes required:       EDG   2007   sprue       Colon 2006       GYN surgery cryoablation of cervix       Lumpectomy   Family History:    hypothyroidism    Family History Diabetes 1st degree relative  Social History:    Married    Never Smoked    Alcohol use-no    Drug use-no   Risk Factors:  Tobacco use:  never Drug use:  no Alcohol use:  no   Review of Systems  The patient denies anorexia, fever, weight loss, vision loss, decreased hearing, hoarseness, chest pain, syncope, dyspnea on exhertion, peripheral edema, prolonged cough, hemoptysis, abdominal pain, melena, hematochezia, severe indigestion/heartburn, hematuria, incontinence, muscle weakness, suspicious skin lesions, transient blindness, difficulty walking, depression, unusual weight change, abnormal bleeding,  enlarged lymph nodes, angioedema, and breast masses.         stable all systems   Physical Exam  General:     Well-developed,well-nourished,in no acute distress; alert,appropriate and cooperative throughout examination Head:     Normocephalic and atraumatic without obvious abnormalities. No apparent alopecia or balding. Mouth:     Oral mucosa and oropharynx without lesions or exudates.  Teeth in good repair. Lungs:     normal respiratory effort, no dullness, and no wheezes.   Heart:     normal rate, regular rhythm, no murmur, and physiological split S2.   Abdomen:     soft and non-tender.   Msk:     normal ROM, no joint tenderness, and no joint swelling.   Pulses:     R and L carotid,radial,femoral,dorsalis pedis and posterior tibial pulses are full and equal bilaterally Extremities:     No clubbing, cyanosis, edema, or deformity noted with normal full range of motion of all joints.   Neurologic:     alert & oriented X3.      Impression & Recommendations:  Problem # 1:  SPRUE (ICD-579.1) hx of slightly abnormal LFT's that seens to correlate with  spur three month monitering.  Orders: TLB-Hepatic/Liver Function Pnl (80076-HEPATIC) TLB-CBC Platelet - w/Differential (85025-CBCD)   Problem # 2:  HYPOTHYROIDISM (ICD-244.9)  Her updated medication list for this problem includes:    Synthroid 25 Mcg Tabs (Levothyroxine sodium) ..... One by mouth daily with 1/2 cytomil 25 mg tablet    Cytomel 25 Mcg Tabs (Liothyronine sodium) ..... One half by mouth daily with sythoid last visit added the synthroid and dcreased the cytomil Orders: TLB-TSH (Thyroid Stimulating Hormone) (84443-TSH) TLB-T3 Uptake (84479-T3UP) TLB-T4 (Thyrox), Free [phone])  Labs Reviewed: TSH: 8.00 (01/30/2007)   Total T4: 4.5 (01/30/2007)      Problem # 3:  HEMORRHOIDS, EXTERNAL (ICD-455.3) Assessment: Unchanged  Problem # 4:  DEPRESSION (ICD-311) Assessment: Unchanged  Her updated medication  list for this problem includes:    Lexapro 10 Mg Tabs (Escitalopram oxalate) ..... Once daily Discussed treatment options, including trial of antidpressant medication. Will refer to behavioral health. Follow-up call in in 24-48 hours and recheck in 2 weeks, sooner as needed. Patient agrees to call if any worsening of symptoms or thoughts of doing harm arise. Verified that the patient has no suicidal ideation at this time.   Complete Medication List: 1)  Lexapro 10 Mg Tabs (Escitalopram oxalate) .... Once daily 2)  Synthroid 25 Mcg Tabs (Levothyroxine sodium) .... One by mouth daily with 1/2 cytomil 25 mg tablet 3)  Cytomel 25 Mcg Tabs (Liothyronine sodium) .... One half by mouth daily with sythoid   Patient Instructions: 1)  Please schedule a follow-up appointment in 3 months.    ]  Appended Document: roa/db

## 2010-08-15 NOTE — Assessment & Plan Note (Signed)
Summary: 3 MONTH ROA/JLS   Vital Signs:  Patient Profile:   55 Years Old Female Height:     67 inches Weight:      156 pounds Temp:     98.2 degrees F oral Pulse rate:   76 / minute Resp:     14 per minute BP sitting:   120 / 76  (left arm)  Vitals Entered By: Willy Eddy, LPN (November 03, 2007 8:28 AM)                 Chief Complaint:  roa labs.  History of Present Illness: follow up of celiac spue discussion of celiac sprue hypothyroidism discusson of the possible side efects of spue     Current Allergies: No known allergies    Family History:    Reviewed history from 05/05/2007 and no changes required:       hypothyroidism       Family History Diabetes 1st degree relative  Social History:    Reviewed history from 05/05/2007 and no changes required:       Married       Never Smoked       Alcohol use-no       Drug use-no    Review of Systems  The patient denies anorexia, fever, weight loss, weight gain, vision loss, decreased hearing, hoarseness, chest pain, syncope, dyspnea on exhertion, peripheral edema, prolonged cough, hemoptysis, abdominal pain, melena, hematochezia, severe indigestion/heartburn, hematuria, incontinence, genital sores, muscle weakness, suspicious skin lesions, transient blindness, difficulty walking, depression, unusual weight change, abnormal bleeding, enlarged lymph nodes, and angioedema.     Physical Exam  General:     alert.   Head:     normocephalic and atraumatic.   Eyes:     pupils equal and pupils round.   Ears:     R ear normal and L ear normal.   Nose:     no external deformity.   Mouth:     good dentition.   Neck:     No deformities, masses, or tenderness noted. Lungs:     normal respiratory effort.   Heart:     normal rate, regular rhythm, and no murmur.   Abdomen:     Bowel sounds positive,abdomen soft and non-tender without masses, organomegaly or hernias noted.    Impression &  Recommendations:  Problem # 1:  HYPOTHYROIDISM (ICD-244.9) see the lastist labs still stable with curent meds  Her updated medication list for this problem includes:    Synthroid 25 Mcg Tabs (Levothyroxine sodium) ..... One by mouth daily with 1/2 cytomil 25 mg tablet    Cytomel 25 Mcg Tabs (Liothyronine sodium) ..... One half by mouth daily with sythoid  Labs Reviewed: TSH: 3.31 (05/05/2007)   Free T4: 0.5 (05/05/2007)      Problem # 2:  SPRUE (ICD-579.1) celiac   dx and diet issue review and well as related dz Father passed    Problem # 3:  LIVER FUNCTION TESTS, ABNORMAL (ICD-794.8) Advised patient to avoid alcohol and Tylenol. Call for worsening symptoms.   Problem # 4:  LIVER FUNCTION TESTS, ABNORMAL (ICD-794.8) Advised patient to avoid alcohol and Tylenol. Call for worsening symptoms.   Complete Medication List: 1)  Lexapro 10 Mg Tabs (Escitalopram oxalate) .... Once daily 2)  Synthroid 25 Mcg Tabs (Levothyroxine sodium) .... One by mouth daily with 1/2 cytomil 25 mg tablet 3)  Cytomel 25 Mcg Tabs (Liothyronine sodium) .... One half by mouth daily with sythoid  Patient Instructions: 1)  Please schedule a follow-up appointment in 3 months. 2)  CPX and pre visit labs 3)  BMP prior to visit, ICD-9: 4)  Hepatic Panel prior to visit, ICD-9: 5)  Lipid Panel prior to visit, ICD-9: 6)  TSH prior to visit, ICD-9:  v70.0 7)  CBC w/ Diff prior to visit, ICD-9: 8)  Urine-dip prior to visit, ICD-9:    ]

## 2010-08-15 NOTE — Assessment & Plan Note (Signed)
Summary: 3 month rov/mm rsc bmp/njr   Vital Signs:  Patient Profile:   55 Years Old Female Height:     67 inches Weight:      145 pounds Temp:     98.5 degrees F oral Pulse rate:   76 / minute Resp:     14 per minute BP sitting:   120 / 70  (left arm)  Vitals Entered By: Willy Eddy, LPN (August 10, 2008 2:53 PM)                 Chief Complaint:  roa.  History of Present Illness:  Follow-Up Visit      This is a 55 year old woman who presents for Follow-up visit.  Since the last visit the patient notes no new problems or concerns.  The patient reports taking meds as prescribed.  When questioned about possible medication side effects, the patient notes none.      Current Allergies: No known allergies   Past Medical History:    Reviewed history from 05/13/2008 and no changes required:       Hypothyroidism       Depression       sprue       Diverticulosis, colon       Hyperlipidemia  Past Surgical History:    Reviewed history from 05/05/2007 and no changes required:       EDG   2007   sprue       Colon 2006       GYN surgery cryoablation of cervix       Lumpectomy   Family History:    Reviewed history from 05/05/2007 and no changes required:       hypothyroidism       Family History Diabetes 1st degree relative  Social History:    Reviewed history from 05/05/2007 and no changes required:       Married       Never Smoked       Alcohol use-no       Drug use-no   Risk Factors: Tobacco use:  never Passive smoke exposure:  no Drug use:  no Alcohol use:  no  Family History Risk Factors:    Family History of MI in females < 63 years old:  yes    Family History of MI in males < 21 years old:  yes   Review of Systems  The patient denies anorexia, fever, weight loss, weight gain, vision loss, decreased hearing, hoarseness, chest pain, syncope, dyspnea on exertion, peripheral edema, prolonged cough, headaches, hemoptysis, abdominal pain, melena,  hematochezia, severe indigestion/heartburn, hematuria, incontinence, genital sores, muscle weakness, suspicious skin lesions, transient blindness, difficulty walking, depression, unusual weight change, abnormal bleeding, enlarged lymph nodes, angioedema, and breast masses.     Physical Exam  General:     alert.  well-developed.   Eyes:     pupils equal and pupils round.   Nose:     nasal dischargemucosal pallor, mucosal erythema, and mucosal edema.   Mouth:     good dentition.  pharynx pink and moist.   Neck:     supple and full ROM.   Lungs:     normal respiratory effort and no intercostal retractions.   Heart:     normal rate and regular rhythm.   Abdomen:     Bowel sounds positive,abdomen soft and non-tender without masses, organomegaly or hernias noted. Msk:     normal ROM, no joint tenderness, and no joint  swelling.   Pulses:     R and L carotid,radial,femoral,dorsalis pedis and posterior tibial pulses are full and equal bilaterally Extremities:     No clubbing, cyanosis, edema, or deformity noted with normal full range of motion of all joints.   Neurologic:     alert & oriented X3.      Impression & Recommendations:  Problem # 1:  HYPOTHYROIDISM (ICD-244.9) Assessment: Unchanged  Her updated medication list for this problem includes:    Synthroid 25 Mcg Tabs (Levothyroxine sodium) ..... One by mouth daily with 1/2 cytomil 25 mg tablet    Cytomel 25 Mcg Tabs (Liothyronine sodium) ..... One half by mouth daily with sythoid  Labs Reviewed: TSH: 4.38 (02/04/2008)   Free T4: 0.5 (05/05/2007)    Chol: 188 (05/06/2008)   HDL: 45.3 (05/06/2008)   LDL: 129 (05/06/2008)   TG: 67 (05/06/2008)  Orders: TLB-TSH (Thyroid Stimulating Hormone) (84443-TSH) TLB-T4 (Thyrox), Free [phone]) TLB-T3, Free (Triiodothyronine) (84481-T3FREE)   Problem # 2:  LIVER FUNCTION TESTS, ABNORMAL (ICD-794.8) Assessment: Unchanged scheduled to get welnees profile at work so we will defer  to that test timeing   Advised patient to avoid alcohol and Tylenol. Call for worsening symptoms.   Problem # 3:  SPRUE (ICD-579.1) on gluten free diet  Complete Medication List: 1)  Lexapro 10 Mg Tabs (Escitalopram oxalate) .... Once daily 2)  Synthroid 25 Mcg Tabs (Levothyroxine sodium) .... One by mouth daily with 1/2 cytomil 25 mg tablet 3)  Cytomel 25 Mcg Tabs (Liothyronine sodium) .... One half by mouth daily with sythoid   Patient Instructions: 1)  Please schedule a follow-up appointment in 6 months.   CPX

## 2010-08-15 NOTE — Assessment & Plan Note (Signed)
Summary: 4 mnth roa-smm      Allergies Added: NKDA  Vital Signs:  Patient Profile:   55 Years Old Female Height:     67 inches Weight:      160 pounds Temp:     98.1 degrees F oral Pulse rate:   72 / minute Resp:     12 per minute BP sitting:   110 / 70  (left arm)  Vitals Entered By: Willy Eddy, LPN (July 30, 2007 8:33 AM)                 Chief Complaint:  roa/thyroid.  History of Present Illness: Current Problems:  follow up visit for  HEMORRHOIDS, EXTERNAL (ICD-455.3) no reported flair DIVERTICULOSIS, COLON (ICD-562.10) pain free SPRUE (ICD-579.1)  following a gluten free diet DEPRESSION  mood is OK LIVER FUNCTION TESTS, ABNORMAL (ICD-794.8) HYPOTHYROIDISM (ICD-244.9) no current symptoms of loww thyroid    Current Allergies (reviewed today): No known allergies  Updated/Current Medications (including changes made in today's visit):  LEXAPRO 10 MG  TABS (ESCITALOPRAM OXALATE) once daily SYNTHROID 25 MCG  TABS (LEVOTHYROXINE SODIUM) one by mouth daily with 1/2 cytomil 25 mg tablet CYTOMEL 25 MCG  TABS (LIOTHYRONINE SODIUM) one half by mouth daily with sythoid   Past Medical History:    Reviewed history from 05/05/2007 and no changes required:       Hypothyroidism       Depression       sprue       Diverticulosis, colon  Past Surgical History:    Reviewed history from 05/05/2007 and no changes required:       EDG   2007   sprue       Colon 2006       GYN surgery cryoablation of cervix       Lumpectomy   Family History:    Reviewed history from 05/05/2007 and no changes required:       hypothyroidism       Family History Diabetes 1st degree relative  Social History:    Reviewed history from 05/05/2007 and no changes required:       Married       Never Smoked       Alcohol use-no       Drug use-no   Risk Factors:  Passive smoke exposure:  no  Family History Risk Factors:    Family History of MI in females < 81 years old:  yes  Family History of MI in males < 4 years old:  yes   Review of Systems  The patient denies fever, weight loss, vision loss, decreased hearing, hoarseness, chest pain, syncope, dyspnea on exhertion, peripheral edema, prolonged cough, hemoptysis, abdominal pain, melena, hematochezia, and severe indigestion/heartburn.     Physical Exam  General:     alert.   Head:     normocephalic and atraumatic.   Eyes:     pupils equal and pupils round.   Ears:     R ear normal and L ear normal.   Nose:     no external deformity.   Mouth:     good dentition.   Neck:     No deformities, masses, or tenderness noted. Chest Wall:     No deformities, masses, or tenderness noted. Lungs:     normal respiratory effort.   Heart:     normal rate, regular rhythm, and no murmur.   Abdomen:     Bowel sounds positive,abdomen soft and non-tender  without masses, organomegaly or hernias noted.    Impression & Recommendations:  Problem # 1:  HEMORRHOIDS, EXTERNAL (ICD-455.3) Assessment: Unchanged  Problem # 2:  DEPRESSION (ICD-311) Assessment: Unchanged  Her updated medication list for this problem includes:    Lexapro 10 Mg Tabs (Escitalopram oxalate) ..... Once daily Discussed treatment options, including trial of antidpressant medication. Will refer to behavioral health. Follow-up call in in 24-48 hours and recheck in 2 weeks, sooner as needed. Patient agrees to call if any worsening of symptoms or thoughts of doing harm arise. Verified that the patient has no suicidal ideation at this time.   Problem # 3:  SPRUE (ICD-579.1) on gluten free diet Orders: TLB-Hepatic/Liver Function Pnl (80076-HEPATIC)   Problem # 4:  LIVER FUNCTION TESTS, ABNORMAL (ICD-794.8)  Orders: TLB-Hepatic/Liver Function Pnl (80076-HEPATIC) Advised patient to avoid alcohol and Tylenol. Call for worsening symptoms.   Problem # 5:  HYPOTHYROIDISM (ICD-244.9)  Her updated medication list for this problem includes:     Synthroid 25 Mcg Tabs (Levothyroxine sodium) ..... One by mouth daily with 1/2 cytomil 25 mg tablet    Cytomel 25 Mcg Tabs (Liothyronine sodium) ..... One half by mouth daily with sythoid  Labs Reviewed: TSH: 3.31 (05/05/2007)   Free T4: 0.5 (05/05/2007)      Complete Medication List: 1)  Lexapro 10 Mg Tabs (Escitalopram oxalate) .... Once daily 2)  Synthroid 25 Mcg Tabs (Levothyroxine sodium) .... One by mouth daily with 1/2 cytomil 25 mg tablet 3)  Cytomel 25 Mcg Tabs (Liothyronine sodium) .... One half by mouth daily with sythoid   Patient Instructions: 1)  Please schedule a follow-up appointment in 3 months. 2)  TSH prior to visit, ICD-9: 244.8     Prescriptions: CYTOMEL 25 MCG  TABS (LIOTHYRONINE SODIUM) one half by mouth daily with sythoid  #30 x 11   Entered and Authorized by:   Stacie Glaze MD   Signed by:   Stacie Glaze MD on 07/30/2007   Method used:   Print then Give to Patient   RxID:   [id number] SYNTHROID 25 MCG  TABS (LEVOTHYROXINE SODIUM) one by mouth daily with 1/2 cytomil 25 mg tablet  #30 x 11   Entered and Authorized by:   Stacie Glaze MD   Signed by:   Stacie Glaze MD on 07/30/2007   Method used:   Print then Give to Patient   RxID:   [id number] LEXAPRO 10 MG  TABS (ESCITALOPRAM OXALATE) once daily  #30 x 11   Entered and Authorized by:   Stacie Glaze MD   Signed by:   Stacie Glaze MD on 07/30/2007   Method used:   Print then Give to Patient   RxID:   [id number]  ]

## 2010-08-15 NOTE — Procedures (Signed)
Summary: colonoscopy  colonoscopy   Imported By: Kassie Mends 02/11/2008 12:18:05  _____________________________________________________________________  External Attachment:    Type:   Image     Comment:   colonoscopy

## 2010-08-15 NOTE — Miscellaneous (Signed)
Summary: Orders Update  Clinical Lists Changes  Problems: Added new problem of COUGH (ICD-786.2) Orders: Added new Test order of T-2 View CXR (71020TC) - Signed 

## 2010-08-15 NOTE — Letter (Signed)
Summary: Health Risk Assessment/[facility]  Health Risk Assessment/[facility]   Imported By: Maryln Gottron 09/14/2008 14:09:00  _____________________________________________________________________  External Attachment:    Type:   Image     Comment:   External Document

## 2010-08-15 NOTE — Progress Notes (Signed)
Summary: PHONE CALL  Phone Note Call from Patient   Caller: Patient Reason for Call: Talk to Doctor Action Taken: Phone Call Completed Summary of Call: PTS HUSBAND (MITCH) CALLED IN TO MAKE APPT FOR HIS WIFE.... ADVISED THAT HE HAD TO GO AND PICK HER UP FROM WORK BECAUSE SHE WAS UNABLE TO DRIVE DUE TO DIZZINESS....  PT THEN GOT ON THE PHONE AND ADVISED THAT SHE WOKE UP THIS AM WITH DIZZINESS (CAUSING N/V) INTERMITTENTLY THROUGHOUT THE MORNING.... PT DENIES ANY H/A OR ASSOCIATED SYMPTOMS ... CHECKED WITH BONNYE, LPN TO SEE ABOUT GETTING PT APPT W/ DR Lovell Sheehan TODAY AND WAS ADV THAT SCHEDULE WAS BOOKED AND PT COULD SEE ANOTHER PHYSICIAN IF APPT WAS AVAILABLE.... ADV PT OF THIS AND SHE STATED THAT SHE WOULD RATHER JUST SEE DR Lovell Sheehan, OFFERED APPT FOR NEXT WK AND PT DECLINED.... PT ADV THAT HER MOTHER SUFFERS FROM [place] AND SHE FEELS THAT THIS MAY BE IN RELATION ( SAME TYPE SYMPTOMS PER PT )... PT ADV THAT SHE WILL WAIT AND SEE HOW THINGS GO AND IF NEEDED SHE WILL GO TO THE ER IF SYMPTOMS CONTINUE. Initial call taken by: Debbra Riding,  June 03, 2009 11:07 AM

## 2010-08-15 NOTE — Progress Notes (Signed)
Summary: REFILL REQUEST  Phone Note Call from Patient   Caller: Spouse Marthann Schiller)  [phone] Summary of Call: Pts husband called to adv that his wifes Rx was sent to the wrong pharmacy.... Request that Rx for med: CYTOMEL 25 MCG  be sent to  [facility]..... Thanks.  Initial call taken by: Debbra Riding,  March 15, 2010 3:09 PM    Prescriptions: CYTOMEL 25 MCG  TABS (LIOTHYRONINE SODIUM) one half by mouth daily with sythoid  #30 Tablet x 11   Entered by:   Willy Eddy, LPN   Authorized by:   Stacie Glaze MD   Signed by:   Willy Eddy, LPN on 45/40/9811   Method used:   Electronically to        [facility] [address] (retail)       [phone]       [place], [place]  [address]       Ph: [phone]       Fax: [phone]   RxID:   [id number]

## 2010-08-15 NOTE — Progress Notes (Signed)
Summary: chest xray results  Phone Note Call from Patient Call back at Work Phone [phone]   Caller: Patient Call For: Stacie Glaze MD Reason for Call: Referral Summary of Call: Pt is asking for chest xray report.  Still improving.  She had the chest xray this am. [phone] Initial call taken by: Lynann Beaver CMA,  Dec 06, 2009 3:33 PM  Follow-up for Phone Call        clear chest tussionex 1 tsp every  Follow-up by: Stacie Glaze MD,  Dec 06, 2009 5:07 PM    New/Updated Medications: Sandria Senter ER 8-10 MG/5ML LQCR (CHLORPHENIRAMINE-HYDROCODONE) 1 tsp every 12 hours Prescriptions: Sandria Senter ER 8-10 MG/5ML LQCR (CHLORPHENIRAMINE-HYDROCODONE) 1 tsp every 12 hours  #6oz x 1   Entered by:   Willy Eddy, LPN   Authorized by:   Stacie Glaze MD   Signed by:   Willy Eddy, LPN on 47/82/9562   Method used:   Telephoned to ...       [facility]  [address]  [phone]* (retail)       [address]       [place], [place]  [address]       Ph: [phone] or [phone]       Fax: [phone]   RxID:   [id number]

## 2010-08-15 NOTE — Progress Notes (Signed)
Summary: bonnie please call  Phone Note Call from Patient Call back at Home Phone [phone]   Caller: Patient Call For: Stacie Glaze MD Summary of Call: pt was seen yesterday by dr Kern Alberta she is still coughing , pt is requesting bonnie to call her back Initial call taken by: Heron Sabins,  Nov 29, 2009 2:21 PM  Follow-up for Phone Call        saw dr burchette -healing TAKES TIME-if not alot better by thursday,call back Follow-up by: Willy Eddy, LPN,  Nov 29, 2009 2:41 PM

## 2010-08-15 NOTE — Progress Notes (Signed)
Summary: pt wants work in appt with Dr. Lovell Sheehan or med called in  Phone Note Call from Patient Call back at Home Phone [phone]   Caller: spouse-Mitch Call For: Stacie Glaze MD Reason for Call: Acute Illness Summary of Call: Pts husband called and said that his wife has sore throat, fever,runny nose,cough,chest congestion. Pt wants a work in appt with Dr. Lovell Sheehan or a medicine called in to [facility] on [address].  Initial call taken by: Lucy Antigua,  March 08, 2009 8:39 AM  Follow-up for Phone Call        office visit given Follow-up by: Willy Eddy, LPN,  March 08, 2009 8:55 AM

## 2010-08-15 NOTE — Miscellaneous (Signed)
Summary: change med  Medications Added ARMOUR THYROID 60 MG  TABS (THYROID) once daily       Clinical Lists Changes  Medications: Changed medication from CYTOMEL 25 MCG  TABS (LIOTHYRONINE SODIUM) once daily to ARMOUR THYROID 60 MG  TABS (THYROID) once daily

## 2010-08-15 NOTE — Progress Notes (Signed)
  Medications Added SYNTHROID 25 MCG  TABS (LEVOTHYROXINE SODIUM) one by mouth daily with 1/2 cytomil 25 mg tablet CYTOMEL 25 MCG  TABS (LIOTHYRONINE SODIUM) one half by mouth daily with sythoid       Phone Note From Pharmacy   Caller: pharmacy/[facility]err phone [phone] Reason for Call: Patient requests substitution Summary of Call: new med for armour thyroid called in several days ago.  Armour thyroid is on back order and pt doesnt want to take med mad from [facility].  Initial call taken by: Willy Eddy, LPN,  February 07, 2007 11:36 AM  Follow-up for Phone Call        maty substitue synthoid 25 mg and 1/2 of cytomil 25 mg daily  may call in 30 day rx with 2 refills Follow-up by: Stacie Glaze MD,  February 07, 2007 11:39 AM    New/Updated Medications: SYNTHROID 25 MCG  TABS (LEVOTHYROXINE SODIUM) one by mouth daily with 1/2 cytomil 25 mg tablet CYTOMEL 25 MCG  TABS (LIOTHYRONINE SODIUM) one half by mouth daily with sythoid  Prescriptions: CYTOMEL 25 MCG  TABS (LIOTHYRONINE SODIUM) one half by mouth daily with sythoid  #30 x 11   Entered by:   Willy Eddy, LPN   Authorized by:   Stacie Glaze MD   Signed by:   Willy Eddy, LPN on 29/56/2130   Method used:   Electronically sent to ...       [facility]       [address]       [place], [place]  [address]       Ph: [phone] or [phone]       Fax: [phone]   RxID:   [id number] SYNTHROID 25 MCG  TABS (LEVOTHYROXINE SODIUM) one by mouth daily with 1/2 cytomil 25 mg tablet  #30 x 11   Entered by:   Willy Eddy, LPN   Authorized by:   Stacie Glaze MD   Signed by:   Willy Eddy, LPN on 95/18/8416   Method used:   Electronically sent to ...       [facility] [address]       [address]       [place], [place]  [address]       Ph: [phone] or [phone]       Fax: [phone]   RxID:   [id number]   Appended Document:      Clinical  Lists Changes  Medications: Removed medication of ARMOUR THYROID 60 MG  TABS (THYROID) once daily

## 2010-08-15 NOTE — Letter (Signed)
Summary: [facility]   Imported By: Kassie Mends 05/23/2007 08:10:34  _____________________________________________________________________  External Attachment:    Type:   Image     Comment:   [facility]

## 2010-08-15 NOTE — Assessment & Plan Note (Signed)
Summary: UTI/dm   Vital Signs:  Patient Profile:   55 Years Old Female Height:     67 inches Temp:     98.7 degrees F oral Pulse rate:   72 / minute BP sitting:   120 / 70  (left arm)  Vitals Entered By: Willy Eddy, LPN (August 19, 2008 9:21 AM)                 Chief Complaint:  c/o dysuria and frequency and Dysuria.  History of Present Illness:  Dysuria      This is a 55 year old woman who presents with Dysuria.  The patient reports burning with urination and urinary frequency, but denies urgency, hematuria, vaginal discharge, vaginal itching, vaginal sores, and penile discharge.  The patient denies the following associated symptoms: nausea, vomiting, fever, shaking chills, flank pain, abdominal pain, back pain, pelvic pain, and arthralgias.  The patient denies the following risk factors: diabetes, prior antibiotics, immunosuppression, history of GU anomaly, history of pyelonephritis, pregnancy, history of STD, and analgesic abuse.  History is significant for no urinary tract problems.      Current Allergies: No known allergies   Past Medical History:    Reviewed history from 05/13/2008 and no changes required:       Hypothyroidism       Depression       sprue       Diverticulosis, colon       Hyperlipidemia  Past Surgical History:    Reviewed history from 05/05/2007 and no changes required:       EDG   2007   sprue       Colon 2006       GYN surgery cryoablation of cervix       Lumpectomy   Family History:    Reviewed history from 05/05/2007 and no changes required:       hypothyroidism       Family History Diabetes 1st degree relative  Social History:    Reviewed history from 05/05/2007 and no changes required:       Married       Never Smoked       Alcohol use-no       Drug use-no    Review of Systems       The patient complains of abdominal pain.  The patient denies anorexia, fever, weight loss, weight gain, vision loss, decreased hearing,  hoarseness, chest pain, syncope, dyspnea on exertion, peripheral edema, prolonged cough, headaches, hemoptysis, melena, hematochezia, severe indigestion/heartburn, hematuria, incontinence, genital sores, muscle weakness, suspicious skin lesions, transient blindness, difficulty walking, depression, unusual weight change, abnormal bleeding, enlarged lymph nodes, angioedema, and breast masses.     Physical Exam  General:     alert.  well-developed.   Head:     normocephalic and atraumatic.   Eyes:     pupils equal and pupils round.   Nose:     nasal dischargemucosal pallor, mucosal erythema, and mucosal edema.   Mouth:     good dentition.  pharynx pink and moist.   Neck:     supple and full ROM.   Lungs:     normal respiratory effort and no intercostal retractions.   Heart:     normal rate and regular rhythm.   Abdomen:     Bowel sounds positive,abdomen soft and non-tender without masses, organomegaly or hernias noted.    Impression & Recommendations:  Problem # 1:  HYPERLIPIDEMIA (ICD-272.4) Assessment: Unchanged  Labs Reviewed: Chol: 188 (05/06/2008)   HDL: 45.3 (05/06/2008)   LDL: 129 (05/06/2008)   TG: 67 (05/06/2008) SGOT: 27 (05/06/2008)   SGPT: 27 (05/06/2008)   Problem # 2:  CYSTITIS, ACUTE (ICD-595.0) Assessment: New  Her updated medication list for this problem includes:    Levaquin 750 Mg Tabs (Levofloxacin) ..... One by mouth dialy for 3 days  Encouraged to push clear liquids, get enough rest, and take acetaminophen as needed. To be seen in 10 days if no improvement, sooner if worse.   Problem # 3:  HYPOTHYROIDISM (ICD-244.9) Assessment: Unchanged  Her updated medication list for this problem includes:    Synthroid 25 Mcg Tabs (Levothyroxine sodium) ..... One by mouth daily with 1/2 cytomil 25 mg tablet    Cytomel 25 Mcg Tabs (Liothyronine sodium) ..... One half by mouth daily with sythoid  Labs Reviewed: TSH: 2.06 (08/10/2008)   Free T4: 0.5 (08/10/2008)     Chol: 188 (05/06/2008)   HDL: 45.3 (05/06/2008)   LDL: 129 (05/06/2008)   TG: 67 (05/06/2008)   Complete Medication List: 1)  Lexapro 10 Mg Tabs (Escitalopram oxalate) .... Once daily 2)  Synthroid 25 Mcg Tabs (Levothyroxine sodium) .... One by mouth daily with 1/2 cytomil 25 mg tablet 3)  Cytomel 25 Mcg Tabs (Liothyronine sodium) .... One half by mouth daily with sythoid 4)  Levaquin 750 Mg Tabs (Levofloxacin) .... One by mouth dialy for 3 days  Other Orders: UA Dipstick w/o Micro (automated)  (81003)   Patient Instructions: 1)  Please schedule a follow-up appointment as needed.    Laboratory Results   Urine Tests    Routine Urinalysis   Color: yellow Appearance: Clear Glucose: negative   (Normal Range: Negative) Bilirubin: negative   (Normal Range: Negative) Ketone: negative   (Normal Range: Negative) Spec. Gravity: 1.020   (Normal Range: 1.003-1.035) Blood: 3+   (Normal Range: Negative) pH: 7.0   (Normal Range: 5.0-8.0) Protein: 2+   (Normal Range: Negative) Urobilinogen: 0.2   (Normal Range: 0-1) Nitrite: positive   (Normal Range: Negative) Leukocyte Esterace: 1+   (Normal Range: Negative)    Comments: Rita Ohara  August 19, 2008 9:15 AM

## 2010-09-13 ENCOUNTER — Other Ambulatory Visit: Payer: Self-pay | Admitting: Internal Medicine

## 2010-12-01 NOTE — Assessment & Plan Note (Signed)
[facility]                           GASTROENTEROLOGY OFFICE NOTE   NAME:Auriemma, NEELY KAMMERER                    MRN:          [id number]  DATE:03/28/2006                            DOB:          07-Feb-1956    CHIEF COMPLAINT:  Follow up of celiac disease.   Ms. Golonka was diagnosed with celiac disease when her tissue  transglutaminase antibodies and antigliadin antibodies returned positive.  This was confirmed with biopsies showing villous atrophy of the duodenum on  February 21, 2006.  I have explained this to her already, she is here for  further followup and discussion.  She had obtained the book The Gluten Free  Bible and has looked at other resources and awaits a dietician evaluation.  She is working on becoming gluten free.  She has talked to her sister about  being tested and I think she will see if her son can be tested by Dr.  Lovell Sheehan, as it is reasonable to do the screening antibodies in first degree  relatives.   Her weight was 152 pounds, height 5 feet 6 inches, pulse 60, blood pressure  110/70.   She has labs pending with Dr. Lovell Sheehan on Monday, which is about 4 days from  now, and I have given her a prescription to ask that they also get a 25-  hydroxy Vitamin D level, make sure they get a CBC and a CMET, also check B12  and folate and ferritin levels as baseline.  She was iron deficient  previously.   I plan to see her back in February with a discussion and plans for repeat  antibodies to see how she has responded to her dietary changes.  I do not  think she needs a liver biopsy or anything like that.  We would look for  normalization of her LFTs, but we will double check on that.  Further plans  pending clinical course.  Typically, a repeat biopsy of the duodenum is done  a year or so down the road after we can confirm normalization of her  antibody levels.   Note:  Regarding her osteopenia, typically we would treat the celiac  disease  with a diet and not go to a bisphosphonate at this point based upon that  level.  I do not think she needs particular vitamin supplementation unless  her Vitamin D level or B12 or folate levels are low and we can address that.                                   Iva Boop, MD,FACG   CEG/MedQ  DD:  03/28/2006  DT:  03/29/2006  Job #:  [id number]   cc:   Stacie Glaze, MD  Patient

## 2010-12-01 NOTE — Op Note (Signed)
   NAME:  Nancy Beard, Nancy Beard                       ACCOUNT NO.:  [id number]   MEDICAL RECORD NO.:  [id number]                   PATIENT TYPE:  AMB   LOCATION:  SDC                                  FACILITY:  [facility]   PHYSICIAN:  Malva Limes, M.D.                 DATE OF BIRTH:  [birth year]   DATE OF PROCEDURE:  03/12/2003  DATE OF DISCHARGE:                                 OPERATIVE REPORT   PREOPERATIVE DIAGNOSIS:  Menorrhagia.   POSTOPERATIVE DIAGNOSIS:  Menorrhagia.   PROCEDURE:  1. Dilation and curettage.  2. Cryoablation of endometrium.   SURGEON:  Malva Limes, M.D.   ANESTHESIA:  General.   ANTIBIOTICS:  Ancef 1 g.   DRAINS:  Red rubber catheter to bladder.   ESTIMATED BLOOD LOSS:  Minimal.   SPECIMENS:  Endometrial curettings sent to pathology.   COMPLICATIONS:  None.   DESCRIPTION OF PROCEDURE:  The patient was taken to the operating room,  where she was placed in the dorsal supine position.  A general anesthetic  was administered without complications.  She was then placed in the dorsal  lithotomy position and prepped with Hibiclens.  Her bladder was drained with  a red rubber catheter.  She was draped in the usual fashion for this  procedure.  A sterile speculum was placed in the vagina.  A single-toothed  tenaculum was applied to the anterior cervical lip.  The cervical os was  dilated to a 29-French.  The uterus was sounded to 8 cm.  A sharp curettage  was performed, with a moderate amount of tissue being obtained and this was  sent to pathology.   Following this, the cryoablation machine was set up and the probe passed to  the right cornua.  A 7 min freeze was performed, followed by a 7 min freeze  in the opposite cornua.  This concluded the procedure.   DISPOSITION:  The patient tolerated the procedure well.  She was taken to  the recovery room in stable condition.  She will be discharged to home.  She  will be instructed to follow up in the office in  four weeks.                                              Malva Limes, M.D.   MA/MEDQ  D:  03/12/2003  T:  03/13/2003  Job:  [id number]

## 2011-04-19 ENCOUNTER — Other Ambulatory Visit: Payer: Self-pay | Admitting: Internal Medicine

## 2011-06-15 ENCOUNTER — Telehealth: Payer: Self-pay | Admitting: Internal Medicine

## 2011-06-15 MED ORDER — OSELTAMIVIR PHOSPHATE 75 MG PO CAPS: 75.0000 mg | ORAL_CAPSULE | Freq: Two times a day (BID) | ORAL | Status: AC

## 2011-06-15 NOTE — Telephone Encounter (Signed)
Can you call and cal in?

## 2011-06-15 NOTE — Telephone Encounter (Signed)
Per hus pt did not get flu shot

## 2011-06-15 NOTE — Telephone Encounter (Signed)
Please advise 

## 2011-06-15 NOTE — Telephone Encounter (Signed)
Rx sent and patient is aware. 

## 2011-06-15 NOTE — Telephone Encounter (Signed)
Having flu like sxs? Body aches, fever. Pt would like something called to [facility]. Thanks.

## 2011-06-15 NOTE — Telephone Encounter (Signed)
If she did not get a flu shot tamiflu 75 BID x 7 dAYS

## 2011-07-18 ENCOUNTER — Other Ambulatory Visit (INDEPENDENT_AMBULATORY_CARE_PROVIDER_SITE_OTHER): Payer: 59

## 2011-07-18 DIAGNOSIS — Z0000 Encounter for general adult medical examination without abnormal findings: Secondary | ICD-10-CM

## 2011-07-18 LAB — HEPATIC FUNCTION PANEL
ALT: 39 U/L — ABNORMAL HIGH (ref 0–35)
AST: 47 U/L — ABNORMAL HIGH (ref 0–37)
Albumin: 4.1 g/dL (ref 3.5–5.2)
Alkaline Phosphatase: 117 U/L (ref 39–117)
Bilirubin, Direct: 0 mg/dL (ref 0.0–0.3)
Total Bilirubin: 0.6 mg/dL (ref 0.3–1.2)
Total Protein: 7.5 g/dL (ref 6.0–8.3)

## 2011-07-18 LAB — TSH: TSH: 2.81 u[IU]/mL (ref 0.35–5.50)

## 2011-07-18 LAB — BASIC METABOLIC PANEL
BUN: 10 mg/dL (ref 6–23)
CO2: 28 mEq/L (ref 19–32)
Calcium: 9.4 mg/dL (ref 8.4–10.5)
Chloride: 108 mEq/L (ref 96–112)
Creatinine, Ser: 0.7 mg/dL (ref 0.4–1.2)
GFR: 98.26 mL/min (ref >60.00)
Glucose, Bld: 99 mg/dL (ref 70–99)
Potassium: 5.3 mEq/L — ABNORMAL HIGH (ref 3.5–5.1)
Sodium: 143 mEq/L (ref 135–145)

## 2011-07-18 LAB — LIPID PANEL
Cholesterol: 224 mg/dL — ABNORMAL HIGH (ref 0–200)
HDL: 50.7 mg/dL (ref >39.00)
Total CHOL/HDL Ratio: 4
Triglycerides: 111 mg/dL (ref 0.0–149.0)
VLDL: 22.2 mg/dL (ref 0.0–40.0)

## 2011-07-18 LAB — CBC WITH DIFFERENTIAL/PLATELET
Basophils Absolute: 0 10*3/uL (ref 0.0–0.1)
Basophils Relative: 0.8 % (ref 0.0–3.0)
Eosinophils Absolute: 0.2 10*3/uL (ref 0.0–0.7)
Eosinophils Relative: 4.5 % (ref 0.0–5.0)
HCT: 35.6 % — ABNORMAL LOW (ref 36.0–46.0)
Hemoglobin: 11.4 g/dL — ABNORMAL LOW (ref 12.0–15.0)
Lymphocytes Relative: 43.1 % (ref 12.0–46.0)
Lymphs Abs: 2.4 10*3/uL (ref 0.7–4.0)
MCHC: 32.1 g/dL (ref 30.0–36.0)
MCV: 74.9 fl — ABNORMAL LOW (ref 78.0–100.0)
Monocytes Absolute: 0.6 10*3/uL (ref 0.1–1.0)
Monocytes Relative: 11.5 % (ref 3.0–12.0)
Neutro Abs: 2.2 10*3/uL (ref 1.4–7.7)
Neutrophils Relative %: 40.1 % — ABNORMAL LOW (ref 43.0–77.0)
Platelets: 271 10*3/uL (ref 150.0–400.0)
RBC: 4.75 Mil/uL (ref 3.87–5.11)
RDW: 16.1 % — ABNORMAL HIGH (ref 11.5–14.6)
WBC: 5.5 10*3/uL (ref 4.5–10.5)

## 2011-07-18 LAB — POCT URINALYSIS DIPSTICK
Bilirubin, UA: NEGATIVE
Blood, UA: NEGATIVE
Glucose, UA: NEGATIVE
Ketones, UA: NEGATIVE
Leukocytes, UA: NEGATIVE
Nitrite, UA: NEGATIVE
Protein, UA: NEGATIVE
Spec Grav, UA: 1.025
Urobilinogen, UA: 0.2
pH, UA: 5.5

## 2011-07-18 LAB — LDL CHOLESTEROL, DIRECT: Direct LDL: 149.1 mg/dL

## 2011-07-25 ENCOUNTER — Encounter: Payer: Self-pay | Admitting: Internal Medicine

## 2011-07-25 ENCOUNTER — Ambulatory Visit (INDEPENDENT_AMBULATORY_CARE_PROVIDER_SITE_OTHER): Payer: 59 | Admitting: Internal Medicine

## 2011-07-25 DIAGNOSIS — K901 Tropical sprue: Secondary | ICD-10-CM

## 2011-07-25 DIAGNOSIS — E039 Hypothyroidism, unspecified: Secondary | ICD-10-CM

## 2011-07-25 DIAGNOSIS — Z0000 Encounter for general adult medical examination without abnormal findings: Secondary | ICD-10-CM

## 2011-07-25 DIAGNOSIS — R945 Abnormal results of liver function studies: Secondary | ICD-10-CM

## 2011-07-25 DIAGNOSIS — E785 Hyperlipidemia, unspecified: Secondary | ICD-10-CM

## 2011-07-25 MED ORDER — SYNTHROID 25 MCG PO TABS: 25.0000 ug | ORAL_TABLET | Freq: Every day | ORAL | Status: DC

## 2011-07-25 MED ORDER — CYTOMEL 25 MCG PO TABS: 25.0000 ug | ORAL_TABLET | Freq: Every day | ORAL | Status: DC

## 2011-07-25 NOTE — Progress Notes (Signed)
Subjective:    Patient ID: Nancy Beard, female    DOB: 01-Apr-1956, 56 y.o.   MRN: [id number]  HPI  Patient presents for yearly physical examination.  She has a history of sprue has not been doing a gluten-free diet.  Her sprue was originally diagnosed when she had elevated liver functions and anemia and indeed her blood work today shows elevated functions and anemia.  Patient 30 minutes face-to-face counseling the patient about sprue the reason for the low gluten diet and her overall condition.    Review of Systems  Constitutional: Negative for activity change, appetite change and fatigue.  HENT: Negative for ear pain, congestion, neck pain, postnasal drip and sinus pressure.   Eyes: Negative for redness and visual disturbance.  Respiratory: Negative for cough, shortness of breath and wheezing.   Gastrointestinal: Negative for abdominal pain and abdominal distention.  Genitourinary: Negative for dysuria, frequency and menstrual problem.  Musculoskeletal: Negative for myalgias, joint swelling and arthralgias.  Skin: Negative for rash and wound.  Neurological: Negative for dizziness, weakness and headaches.  Hematological: Negative for adenopathy. Does not bruise/bleed easily.  Psychiatric/Behavioral: Negative for sleep disturbance and decreased concentration.   Past Medical History  Diagnosis Date  . Thyroid disease   . Depression   . Sprue   . Diverticulosis   . Hyperlipidemia     History   Social History  . Marital Status: Married    Spouse Name: N/A    Number of Children: N/A  . Years of Education: N/A   Occupational History  . Not on file.   Social History Main Topics  . Smoking status: Never Smoker   . Smokeless tobacco: Not on file  . Alcohol Use: No  . Drug Use: Not on file  . Sexually Active: Not on file   Other Topics Concern  . Not on file   Social History Narrative  . No narrative on file    Past Surgical History  Procedure Date  .  Esophagogastroduodenoscopy 2007    sprue  . Colonoscopy 2006  . Cryoblation of cervix   . Breast lumpectomy     No family history on file.  No Known Allergies  No current outpatient prescriptions on file prior to visit.    BP 136/80  Pulse 80  Temp 98.6 F (37 C)  Resp 16  Ht 5\' 6"  (1.676 m)  Wt 152 lb (68.947 kg)  BMI 24.53 kg/m2       Objective:   Physical Exam  Nursing note and vitals reviewed. Constitutional: She is oriented to person, place, and time. She appears well-developed and well-nourished. No distress.  HENT:  Head: Normocephalic and atraumatic.  Right Ear: External ear normal.  Left Ear: External ear normal.  Nose: Nose normal.  Mouth/Throat: Oropharynx is clear and moist.  Eyes: Conjunctivae and EOM are normal. Pupils are equal, round, and reactive to light.  Neck: Normal range of motion. Neck supple. No JVD present. No tracheal deviation present. No thyromegaly present.  Cardiovascular: Normal rate, regular rhythm, normal heart sounds and intact distal pulses.   No murmur heard. Pulmonary/Chest: Effort normal and breath sounds normal. She has no wheezes. She exhibits no tenderness.  Abdominal: Soft. Bowel sounds are normal.  Musculoskeletal: Normal range of motion. She exhibits no edema and no tenderness.  Lymphadenopathy:    She has no cervical adenopathy.  Neurological: She is alert and oriented to person, place, and time. She has normal reflexes. No cranial nerve deficit.  Skin: Skin  is warm and dry. She is not diaphoretic.  Psychiatric: She has a normal mood and affect. Her behavior is normal.          Assessment & Plan:   This is a routine physical examination for this healthy  Female. Reviewed all health maintenance protocols including mammography colonoscopy bone density and reviewed appropriate screening labs. Her immunization history was reviewed as well as her current medications and allergies refills of her chronic medications were  given and the plan for yearly health maintenance was discussed all orders and referrals were made as appropriate.  The pt has not been on the gluten free diet Need to be on this diet and needs to have the liver functions monitored. monitoring hypothyroidism

## 2011-07-25 NOTE — Patient Instructions (Signed)
The patient is instructed to continue all medications as prescribed. Schedule followup with check out clerk upon leaving the clinic  

## 2011-08-07 ENCOUNTER — Telehealth: Payer: Self-pay | Admitting: Family Medicine

## 2011-08-07 NOTE — Telephone Encounter (Signed)
When pt picked up her generic CYTOMEL last time, the SIG was to take a whole 0.25 tab. Previously, she has been taking 1/2 a 0.25 tab. Please call pt to clarify.

## 2011-08-07 NOTE — Telephone Encounter (Signed)
1/2 is correct and pt informed

## 2011-11-15 ENCOUNTER — Other Ambulatory Visit (INDEPENDENT_AMBULATORY_CARE_PROVIDER_SITE_OTHER): Payer: 59

## 2011-11-15 DIAGNOSIS — R945 Abnormal results of liver function studies: Secondary | ICD-10-CM

## 2011-11-15 LAB — HEPATIC FUNCTION PANEL
ALT: 27 U/L (ref 0–35)
AST: 31 U/L (ref 0–37)
Albumin: 4.2 g/dL (ref 3.5–5.2)
Alkaline Phosphatase: 117 U/L (ref 39–117)
Bilirubin, Direct: 0 mg/dL (ref 0.0–0.3)
Total Bilirubin: 0.4 mg/dL (ref 0.3–1.2)
Total Protein: 7.3 g/dL (ref 6.0–8.3)

## 2011-11-21 ENCOUNTER — Ambulatory Visit (INDEPENDENT_AMBULATORY_CARE_PROVIDER_SITE_OTHER): Payer: 59 | Admitting: Internal Medicine

## 2011-11-21 ENCOUNTER — Encounter: Payer: Self-pay | Admitting: Internal Medicine

## 2011-11-21 VITALS — BP 120/76 | HR 72 | Temp 98.2°F | Resp 16 | Ht 66.0 in | Wt 151.0 lb

## 2011-11-21 DIAGNOSIS — K901 Tropical sprue: Secondary | ICD-10-CM

## 2011-11-21 DIAGNOSIS — R945 Abnormal results of liver function studies: Secondary | ICD-10-CM

## 2011-11-21 DIAGNOSIS — R748 Abnormal levels of other serum enzymes: Secondary | ICD-10-CM

## 2011-11-21 DIAGNOSIS — E039 Hypothyroidism, unspecified: Secondary | ICD-10-CM

## 2011-11-21 DIAGNOSIS — H11129 Conjunctival concretions, unspecified eye: Secondary | ICD-10-CM

## 2011-11-21 MED ORDER — TOBRAMYCIN-DEXAMETHASONE 0.3-0.1 % OP SUSP: 1.0000 [drp] | OPHTHALMIC | Status: AC

## 2011-11-21 NOTE — Patient Instructions (Signed)
"  practical paleo" 

## 2011-12-12 ENCOUNTER — Telehealth: Payer: Self-pay | Admitting: Family Medicine

## 2011-12-12 MED ORDER — CIPROFLOXACIN HCL 250 MG PO TABS: 250.0000 mg | ORAL_TABLET | Freq: Two times a day (BID) | ORAL | Status: AC

## 2011-12-12 NOTE — Telephone Encounter (Signed)
Pulled from Triage vmail. Pt called at 9:09. She thinks she has a bladder infection - reports urination urgency and slight odor. Is going out of town and does not want an OV, just a Rx called in to [facility] on [address]. Thanks.

## 2011-12-12 NOTE — Telephone Encounter (Signed)
cipro 250 bid  For 5 days per dr Lovell Sheehan

## 2011-12-12 NOTE — Telephone Encounter (Signed)
Pt informed

## 2012-04-09 NOTE — Progress Notes (Signed)
Subjective:    Patient ID: Nancy Beard, female    DOB: [birth year], 56 y.o.   MRN: [id number]  HPI Patient is a 56 year old female who is followed for hypothyroidism hyperlipidemia and a history of sprue. her sprue presented originally with elevated liver functions. She has not been following a gluten-free diet and has noticed some abdominal swelling.  She denies any chest pain shortness of breath PND orthopnea she has noticed no symptoms of hypothyroidism including hair loss her nails splitting      Review of Systems  Constitutional: Negative for activity change, appetite change and fatigue.  HENT: Negative for ear pain, congestion, neck pain, postnasal drip and sinus pressure.   Eyes: Negative for redness and visual disturbance.  Respiratory: Negative for cough, shortness of breath and wheezing.   Gastrointestinal: Negative for abdominal pain and abdominal distention.  Genitourinary: Negative for dysuria, frequency and menstrual problem.  Musculoskeletal: Negative for myalgias, joint swelling and arthralgias.  Skin: Negative for rash and wound.  Neurological: Negative for dizziness, weakness and headaches.  Hematological: Negative for adenopathy. Does not bruise/bleed easily.  Psychiatric/Behavioral: Negative for disturbed wake/sleep cycle and decreased concentration.   Past Medical History  Diagnosis Date  . Thyroid disease   . Depression   . Sprue   . Diverticulosis   . Hyperlipidemia     History   Social History  . Marital Status: Married    Spouse Name: N/A    Number of Children: N/A  . Years of Education: N/A   Occupational History  . Not on file.   Social History Main Topics  . Smoking status: Never Smoker   . Smokeless tobacco: Not on file  . Alcohol Use: No  . Drug Use: Not on file  . Sexually Active: Not on file   Other Topics Concern  . Not on file   Social History Narrative  . No narrative on file    Past Surgical History  Procedure Date    . Esophagogastroduodenoscopy 2007    sprue  . Colonoscopy 2006  . Cryoblation of cervix   . Breast lumpectomy     No family history on file.  No Known Allergies  Current Outpatient Prescriptions on File Prior to Visit  Medication Sig Dispense Refill  . liothyronine (CYTOMEL) 25 MCG tablet Take 12.5 mcg by mouth daily.      [name] SYNTHROID 25 MCG tablet Take 1 tablet (25 mcg total) by mouth daily.  90 each  3    BP 120/76  Pulse 72  Temp 98.2 F (36.8 C)  Resp 16  Ht 5\' 6"  (1.676 m)  Wt 151 lb (68.493 kg)  BMI 24.37 kg/m2       Objective:   Physical Exam  Constitutional: She is oriented to person, place, and time. She appears well-developed and well-nourished. No distress.  HENT:  Head: Normocephalic and atraumatic.  Right Ear: External ear normal.  Left Ear: External ear normal.  Nose: Nose normal.  Mouth/Throat: Oropharynx is clear and moist.  Eyes: Conjunctivae normal and EOM are normal. Pupils are equal, round, and reactive to light.  Neck: Normal range of motion. Neck supple. No JVD present. No tracheal deviation present. No thyromegaly present.  Cardiovascular: Normal rate, regular rhythm, normal heart sounds and intact distal pulses.   No murmur heard. Pulmonary/Chest: Effort normal and breath sounds normal. She has no wheezes. She exhibits no tenderness.  Abdominal: Soft. Bowel sounds are normal.  Musculoskeletal: Normal range of motion. She exhibits no edema  and no tenderness.  Lymphadenopathy:    She has no cervical adenopathy.  Neurological: She is alert and oriented to person, place, and time. She has normal reflexes. No cranial nerve deficit.  Skin: Skin is warm and dry. She is not diaphoretic.  Psychiatric: She has a normal mood and affect. Her behavior is normal.          Assessment & plan.  On exam liver functions planned.  Monitoring of thyroid including TSH T3 free T4.  Lipid panel and liver panel drawn patient has history of spruelan:    Patient has a history of sprue and has not been following a gluten-free diet.  She has noticed some abdominal bloating and diarrhea we will monitor her liver functions today and have recommended that she resume a gluten-free diet.  Patient has a history of hypothyroidism and we'll measure a TSH T3 and T4.  Patient's lipids should be checked with a lipid and liver panel

## 2012-04-29 ENCOUNTER — Encounter: Payer: Self-pay | Admitting: Internal Medicine

## 2012-04-29 ENCOUNTER — Ambulatory Visit (INDEPENDENT_AMBULATORY_CARE_PROVIDER_SITE_OTHER): Payer: 59 | Admitting: Internal Medicine

## 2012-04-29 VITALS — BP 124/88 | Temp 98.1°F | Wt 154.0 lb

## 2012-04-29 DIAGNOSIS — L0233 Carbuncle of buttock: Secondary | ICD-10-CM | POA: Insufficient documentation

## 2012-04-29 MED ORDER — SULFAMETHOXAZOLE-TMP DS 800-160 MG PO TABS: 1.0000 | ORAL_TABLET | Freq: Two times a day (BID) | ORAL | Status: DC

## 2012-04-29 NOTE — Progress Notes (Signed)
  Subjective:    Patient ID: Nancy Beard, female    DOB: [birth year], 56 y.o.   MRN: [id number]  HPI  56 year old white female with history of celiac sprue and hypothyroidism complains of skin lesion on left buttock. Patient reports it has the appearance of "spider bite". Her symptoms started one week ago. She had 2 small spots that are getting more red and slightly tender. She is also has mild pruritus. She has history of fever blisters in the past. Her previous fever blisters have not been pruritic.  Review of Systems Negative for fever or chills  Past Medical History  Diagnosis Date  . Thyroid disease   . Depression   . Sprue   . Diverticulosis   . Hyperlipidemia     History   Social History  . Marital Status: Married    Spouse Name: N/A    Number of Children: N/A  . Years of Education: N/A   Occupational History  . Not on file.   Social History Main Topics  . Smoking status: Never Smoker   . Smokeless tobacco: Not on file  . Alcohol Use: No  . Drug Use: Not on file  . Sexually Active: Not on file   Other Topics Concern  . Not on file   Social History Narrative  . No narrative on file    Past Surgical History  Procedure Date  . Esophagogastroduodenoscopy 2007    sprue  . Colonoscopy 2006  . Cryoblation of cervix   . Breast lumpectomy     No family history on file.  No Known Allergies  Current Outpatient Prescriptions on File Prior to Visit  Medication Sig Dispense Refill  . SYNTHROID 25 MCG tablet Take 1 tablet (25 mcg total) by mouth daily.  90 each  3    BP 124/88  Temp 98.1 F (36.7 C) (Oral)  Wt 154 lb (69.854 kg)       Objective:   Physical Exam  Constitutional: She appears well-developed and well-nourished.  Cardiovascular: Normal rate, regular rhythm and normal heart sounds.   Pulmonary/Chest: Effort normal and breath sounds normal. She has no wheezes.  Skin:       2 small clusters of pus filled superficial lesions on left  buttock          Assessment & Plan:

## 2012-04-29 NOTE — Assessment & Plan Note (Signed)
56 year old white female with probable early abscess in her left buttock. Treat with warm compress. Discussed possibility of MRSA. Use Bactrim DS twice daily for 10 days.  Please call our office if your symptoms do not improve or gets worse.  Reassess in 2 weeks.

## 2012-04-29 NOTE — Patient Instructions (Signed)
Use warm compress twice daily Please call our office if your symptoms do not improve or gets worse.

## 2012-05-19 ENCOUNTER — Other Ambulatory Visit: Payer: Self-pay | Admitting: Obstetrics and Gynecology

## 2012-05-24 ENCOUNTER — Other Ambulatory Visit: Payer: Self-pay | Admitting: Internal Medicine

## 2012-06-04 ENCOUNTER — Other Ambulatory Visit: Payer: Self-pay | Admitting: Obstetrics and Gynecology

## 2012-06-04 DIAGNOSIS — R928 Other abnormal and inconclusive findings on diagnostic imaging of breast: Secondary | ICD-10-CM

## 2012-06-18 ENCOUNTER — Ambulatory Visit
Admission: RE | Admit: 2012-06-18 | Discharge: 2012-06-18 | Disposition: A | Discharge status: Home / Self Care | Payer: Private Health Insurance - Indemnity | Source: Ambulatory Visit | Attending: Obstetrics and Gynecology | Admitting: Obstetrics and Gynecology

## 2012-06-18 ENCOUNTER — Other Ambulatory Visit: Payer: Self-pay | Admitting: Obstetrics and Gynecology

## 2012-06-18 DIAGNOSIS — R928 Other abnormal and inconclusive findings on diagnostic imaging of breast: Secondary | ICD-10-CM

## 2012-06-19 ENCOUNTER — Other Ambulatory Visit: Payer: Self-pay | Admitting: Obstetrics and Gynecology

## 2012-06-19 ENCOUNTER — Ambulatory Visit
Admission: RE | Admit: 2012-06-19 | Discharge: 2012-06-19 | Disposition: A | Discharge status: Home / Self Care | Payer: Private Health Insurance - Indemnity | Source: Ambulatory Visit | Attending: Obstetrics and Gynecology | Admitting: Obstetrics and Gynecology

## 2012-06-19 DIAGNOSIS — N63 Unspecified lump in unspecified breast: Secondary | ICD-10-CM

## 2012-06-19 DIAGNOSIS — C50911 Malignant neoplasm of unspecified site of right female breast: Secondary | ICD-10-CM

## 2012-06-24 ENCOUNTER — Ambulatory Visit
Admission: RE | Admit: 2012-06-24 | Discharge: 2012-06-24 | Disposition: A | Discharge status: Home / Self Care | Payer: Private Health Insurance - Indemnity | Source: Ambulatory Visit | Attending: Obstetrics and Gynecology | Admitting: Obstetrics and Gynecology

## 2012-06-24 ENCOUNTER — Telehealth: Payer: Self-pay | Admitting: *Deleted

## 2012-06-24 DIAGNOSIS — C50319 Malignant neoplasm of lower-inner quadrant of unspecified female breast: Secondary | ICD-10-CM

## 2012-06-24 DIAGNOSIS — C50911 Malignant neoplasm of unspecified site of right female breast: Secondary | ICD-10-CM

## 2012-06-24 DIAGNOSIS — C50311 Malignant neoplasm of lower-inner quadrant of right female breast: Secondary | ICD-10-CM | POA: Insufficient documentation

## 2012-06-24 MED ORDER — GADOBENATE DIMEGLUMINE 529 MG/ML IV SOLN
14.0000 mL | Freq: Once | INTRAVENOUS | Status: AC | PRN
  Administered 2012-06-24: 14 mL via INTRAVENOUS

## 2012-06-24 NOTE — Telephone Encounter (Signed)
Confirmed [facility] for 07/02/12 at 0800 .  Instructions and contact information given.

## 2012-07-01 ENCOUNTER — Other Ambulatory Visit: Payer: Private Health Insurance - Indemnity

## 2012-07-02 ENCOUNTER — Telehealth: Payer: Self-pay | Admitting: *Deleted

## 2012-07-02 ENCOUNTER — Encounter: Payer: Self-pay | Admitting: *Deleted

## 2012-07-02 ENCOUNTER — Encounter: Payer: Self-pay | Admitting: Oncology

## 2012-07-02 ENCOUNTER — Ambulatory Visit
Admission: RE | Admit: 2012-07-02 | Discharge: 2012-07-02 | Disposition: A | Discharge status: Home / Self Care | Payer: Private Health Insurance - Indemnity | Source: Ambulatory Visit | Attending: Radiation Oncology | Admitting: Radiation Oncology

## 2012-07-02 ENCOUNTER — Other Ambulatory Visit (INDEPENDENT_AMBULATORY_CARE_PROVIDER_SITE_OTHER): Payer: Self-pay | Admitting: General Surgery

## 2012-07-02 ENCOUNTER — Other Ambulatory Visit (HOSPITAL_BASED_OUTPATIENT_CLINIC_OR_DEPARTMENT_OTHER): Payer: Private Health Insurance - Indemnity

## 2012-07-02 ENCOUNTER — Ambulatory Visit: Payer: Private Health Insurance - Indemnity | Attending: General Surgery | Admitting: Physical Therapy

## 2012-07-02 ENCOUNTER — Ambulatory Visit: Payer: Private Health Insurance - Indemnity

## 2012-07-02 ENCOUNTER — Ambulatory Visit (HOSPITAL_BASED_OUTPATIENT_CLINIC_OR_DEPARTMENT_OTHER): Payer: Private Health Insurance - Indemnity | Admitting: Oncology

## 2012-07-02 ENCOUNTER — Ambulatory Visit (HOSPITAL_BASED_OUTPATIENT_CLINIC_OR_DEPARTMENT_OTHER): Payer: Private Health Insurance - Indemnity | Admitting: General Surgery

## 2012-07-02 VITALS — BP 150/82 | HR 77 | Temp 97.6°F | Resp 20 | Ht 66.0 in | Wt 153.2 lb

## 2012-07-02 DIAGNOSIS — C50319 Malignant neoplasm of lower-inner quadrant of unspecified female breast: Secondary | ICD-10-CM

## 2012-07-02 DIAGNOSIS — Z171 Estrogen receptor negative status [ER-]: Secondary | ICD-10-CM

## 2012-07-02 DIAGNOSIS — C50919 Malignant neoplasm of unspecified site of unspecified female breast: Secondary | ICD-10-CM

## 2012-07-02 DIAGNOSIS — M25619 Stiffness of unspecified shoulder, not elsewhere classified: Secondary | ICD-10-CM | POA: Insufficient documentation

## 2012-07-02 DIAGNOSIS — IMO0001 Reserved for inherently not codable concepts without codable children: Secondary | ICD-10-CM | POA: Insufficient documentation

## 2012-07-02 LAB — CBC WITH DIFFERENTIAL/PLATELET
BASO%: 0.2 % (ref 0.0–2.0)
Basophils Absolute: 0 10*3/uL (ref 0.0–0.1)
EOS%: 4 % (ref 0.0–7.0)
Eosinophils Absolute: 0.2 10*3/uL (ref 0.0–0.5)
HCT: 36 % (ref 34.8–46.6)
HGB: 11.9 g/dL (ref 11.6–15.9)
LYMPH%: 40 % (ref 14.0–49.7)
MCH: 25 pg — ABNORMAL LOW (ref 25.1–34.0)
MCHC: 33.1 g/dL (ref 31.5–36.0)
MCV: 75.7 fL — ABNORMAL LOW (ref 79.5–101.0)
MONO#: 0.6 10*3/uL (ref 0.1–0.9)
MONO%: 9.5 % (ref 0.0–14.0)
NEUT#: 2.7 10*3/uL (ref 1.5–6.5)
NEUT%: 46.3 % (ref 38.4–76.8)
Platelets: 259 10*3/uL (ref 145–400)
RBC: 4.76 10*6/uL (ref 3.70–5.45)
RDW: 16.1 % — ABNORMAL HIGH (ref 11.2–14.5)
WBC: 5.9 10*3/uL (ref 3.9–10.3)
lymph#: 2.4 10*3/uL (ref 0.9–3.3)

## 2012-07-02 LAB — COMPREHENSIVE METABOLIC PANEL (CC13)
ALT: 32 U/L (ref 0–55)
AST: 28 U/L (ref 5–34)
Albumin: 3.7 g/dL (ref 3.5–5.0)
Alkaline Phosphatase: 150 U/L (ref 40–150)
BUN: 13 mg/dL (ref 7.0–26.0)
CO2: 26 mEq/L (ref 22–29)
Calcium: 9 mg/dL (ref 8.4–10.4)
Chloride: 104 mEq/L (ref 98–107)
Creatinine: 0.7 mg/dL (ref 0.6–1.1)
Glucose: 102 mg/dl — ABNORMAL HIGH (ref 70–99)
Potassium: 3.3 mEq/L — ABNORMAL LOW (ref 3.5–5.1)
Sodium: 146 mEq/L — ABNORMAL HIGH (ref 136–145)
Total Bilirubin: 0.45 mg/dL (ref 0.20–1.20)
Total Protein: 7.3 g/dL (ref 6.4–8.3)

## 2012-07-02 LAB — CANCER ANTIGEN 27.29: CA 27.29: 31 U/mL (ref 0–39)

## 2012-07-02 MED ORDER — PROCHLORPERAZINE MALEATE 10 MG PO TABS: 10.0000 mg | ORAL_TABLET | Freq: Four times a day (QID) | ORAL | Status: DC | PRN

## 2012-07-02 MED ORDER — LIDOCAINE-PRILOCAINE 2.5-2.5 % EX CREA: TOPICAL_CREAM | CUTANEOUS | Status: DC | PRN

## 2012-07-02 MED ORDER — ONDANSETRON HCL 8 MG PO TABS: 8.0000 mg | ORAL_TABLET | Freq: Two times a day (BID) | ORAL | Status: DC | PRN

## 2012-07-02 MED ORDER — DEXAMETHASONE 4 MG PO TABS: ORAL_TABLET | ORAL | Status: DC

## 2012-07-02 MED ORDER — LORAZEPAM 0.5 MG PO TABS: 0.5000 mg | ORAL_TABLET | Freq: Four times a day (QID) | ORAL | Status: DC | PRN

## 2012-07-02 MED ORDER — PROCHLORPERAZINE 25 MG RE SUPP: 25.0000 mg | Freq: Two times a day (BID) | RECTAL | Status: DC | PRN

## 2012-07-02 NOTE — Telephone Encounter (Signed)
Per staff message and POF I scheduled appts.  JMW

## 2012-07-02 NOTE — Patient Instructions (Signed)
[facility] °Office Phone Number [phone] ° °BREAST BIOPSY/ PARTIAL MASTECTOMY: POST OP INSTRUCTIONS ° °Always review your discharge instruction sheet given to you by the facility where your surgery was performed. ° °IF YOU HAVE DISABILITY OR FAMILY LEAVE FORMS, YOU MUST BRING THEM TO THE OFFICE FOR PROCESSING.  DO NOT GIVE THEM TO YOUR DOCTOR. ° °1. A prescription for pain medication may be given to you upon discharge.  Take your pain medication as prescribed, if needed.  If narcotic pain medicine is not needed, then you may take acetaminophen (Tylenol) or ibuprofen (Advil) as needed. °2. Take your usually prescribed medications unless otherwise directed °3. If you need a refill on your pain medication, please contact your pharmacy.  They will contact our office to request authorization.  Prescriptions will not be filled after 5pm or on week-ends. °4. You should eat very light the first 24 hours after surgery, such as soup, crackers, pudding, etc.  Resume your normal diet the day after surgery. °5. Most patients will experience some swelling and bruising in the breast.  Ice packs and a good support bra will help.  Swelling and bruising can take several days to resolve.  °6. It is common to experience some constipation if taking pain medication after surgery.  Increasing fluid intake and taking a stool softener will usually help or prevent this problem from occurring.  A mild laxative (Milk of Magnesia or Miralax) should be taken according to package directions if there are no bowel movements after 48 hours. °7. Unless discharge instructions indicate otherwise, you may remove your bandages 24-48 hours after surgery, and you may shower at that time.  You may have steri-strips (small skin tapes) in place directly over the incision.  These strips should be left on the skin for 7-10 days.  If your surgeon used skin glue on the incision, you may shower in 24 hours.  The glue will flake off over the  next 2-3 weeks.  Any sutures or staples will be removed at the office during your follow-up visit. °8. ACTIVITIES:  You may resume regular daily activities (gradually increasing) beginning the next day.  Wearing a good support bra or sports bra minimizes pain and swelling.  You may have sexual intercourse when it is comfortable. °a. You may drive when you no longer are taking prescription pain medication, you can comfortably wear a seatbelt, and you can safely maneuver your car and apply brakes. °b. RETURN TO WORK:  ______________________________________________________________________________________ °9. You should see your doctor in the office for a follow-up appointment approximately two weeks after your surgery.  Your doctor’s nurse will typically make your follow-up appointment when she calls you with your pathology report.  Expect your pathology report 2-3 business days after your surgery.  You may call to check if you do not hear from us after three days. °10. OTHER INSTRUCTIONS: _______________________________________________________________________________________________ _____________________________________________________________________________________________________________________________________ °_____________________________________________________________________________________________________________________________________ °_____________________________________________________________________________________________________________________________________ ° °WHEN TO CALL YOUR DOCTOR: °1. Fever over 101.0 °2. Nausea and/or vomiting. °3. Extreme swelling or bruising. °4. Continued bleeding from incision. °5. Increased pain, redness, or drainage from the incision. ° °The clinic staff is available to answer your questions during regular business hours.  Please don’t hesitate to call and ask to speak to one of the nurses for clinical concerns.  If you have a medical emergency, go to the nearest  emergency room or call 911.  A surgeon from [facility] is always on call at the hospital. ° °For further questions, please visit [email or web address]  °

## 2012-07-02 NOTE — Progress Notes (Signed)
Patient ID: Nancy Beard, female   DOB: 02-Jan-1956, 56 y.o.   MRN: [id number]  No chief complaint on file.   HPI Nancy Beard is a 56 y.o. female.   HPI  She is referred by Dr. Cain Beard for further evaluation and treatment of newly diagnosed invasive ductal carcinoma of the right breast-grade 3, triple negative, proliferation rate is 34%.  The lesion is in the 5:00 position of the right breast and was seen on a screening mammogram. It measured 1.1 cm in ultrasound a 1.9 cm on MRI. Image guided biopsy demonstrated the above pathology. There is no family history of breast cancer. Age at menarche was 10, age at first live birth was 57, she is postmenopausal since 2000.  She is here with her husband.  Past Medical History  Diagnosis Date  . Thyroid disease-hypothyroidism   . Depression   . Sprue   . Diverticulosis   . Hyperlipidemia   . Breast cancer     Past Surgical History  Procedure Date  . Esophagogastroduodenoscopy 2007    sprue  . Colonoscopy 2006  . Cryoblation of cervix   . Breast lumpectomy-left     No family history on file.  Social History History  Substance Use Topics  . Smoking status: Never Smoker   . Smokeless tobacco: Not on file  . Alcohol Use: No    No Known Allergies  Current Outpatient Prescriptions  Medication Sig Dispense Refill  . acetaminophen (TYLENOL) 325 MG tablet Take 650 mg by mouth as needed.      [name] liothyronine (CYTOMEL) 25 MCG tablet TAKE 1 TABLET DAILY  90 tablet  3  . sulfamethoxazole-trimethoprim (BACTRIM DS) 800-160 MG per tablet Take 1 tablet by mouth 2 (two) times daily.  20 tablet  0  . SYNTHROID 25 MCG tablet Take 1 tablet (25 mcg total) by mouth daily.  90 each  3    Review of Systems Review of Systems  Constitutional: Negative.   HENT: Negative.   Eyes:       Wears glasses.  Respiratory: Negative.   Cardiovascular: Negative.   Gastrointestinal: Negative.   Genitourinary: Negative.   Musculoskeletal:  Negative.   Neurological: Negative.   Hematological: Negative.     There were no vitals taken for this visit.  Physical Exam Physical Exam  Constitutional: She is oriented to person, place, and time. She appears well-developed and well-nourished. No distress.  HENT:  Head: Normocephalic and atraumatic.  Eyes: Pupils are equal, round, and reactive to light. No scleral icterus.  Neck: Neck supple.  Cardiovascular: Normal rate and regular rhythm.   Pulmonary/Chest: Effort normal and breath sounds normal.       The breasts are symmetrical in size. No palpable masses or suspicious skin changes in either breast.  Abdominal: Soft. She exhibits no mass. There is no tenderness.  Musculoskeletal: She exhibits no edema.  Lymphadenopathy:    She has no cervical adenopathy.  Neurological: She is alert and oriented to person, place, and time.  Skin: Skin is warm and dry.  Psychiatric: She has a normal mood and affect. Her behavior is normal.    Data Reviewed Imaging studies and pathology report.  Assessment    Newly diagnosed triple negative invasive ductal carcinoma of right breast. I feel she is a good candidate for breast conservation therapy and she is interested in that as well. She will also need a Port-A-Cath for postoperative chemotherapy.     Plan    Right partial  mastectomy after wire localization, right axillary sentinel lymph node biopsy, ultrasound guided Port-A-Cath insertion.  I have explained the procedure, risks, and aftercare.  The risks include but are not limited to bleeding, infection, wound problems, seroma formation, cosmetic deformity, anesthesia, nerve injury, lymphedema, need for reexcision or removal of more lymph nodes at a later time.  Also, the procedure risks and aftercare of Port-a-cath insertion have been explained. Risks include but are not limited to bleeding, infection, malfunction, pneumothorax, wound problems, DVT.   She seems to understand all of the  above and agrees with the plan.       Nancy Beard 07/02/2012, 10:39 AM

## 2012-07-02 NOTE — Telephone Encounter (Signed)
07-17-2012 genetics counselling  07-22-2012 chemo education at 5:00pm  07-29-2012 echo at [facility]  08-11-2012 lab and md and treatment  08-12-2012 injection  08-25-2012 lab and np and treatment  08-26-2012 injection  09-08-2012 lab and np and treatment  09-09-2012 injection  09-22-2012 lab and np and treatment  09-23-2012 injection  Sent michelle email to set up treatment

## 2012-07-02 NOTE — Progress Notes (Signed)
Nancy Beard [id number] [birth year] 56 y.o. 07/02/2012 10:58 AM  CC  Nancy Mew, MD [address] [place] [address] Dr. Lurline Hare Dr. Avel Peace  REASON FOR CONSULTATION:  56 year old female with new diagnosis of stage I Beard breast cancer that is triple negative Patient was seen in the Multidisciplinary Breast Clinic for discussion of her treatment options.   STAGE:   Cancer of lower-inner quadrant of female breast   Primary site: Breast (Right)   Staging method: AJCC 7th Edition   Clinical: Stage IA (T1c, N0, cM0)   Summary: Stage IA (T1c, N0, cM0)  REFERRING PHYSICIAN: Dr. Avel Peace  HISTORY OF PRESENT ILLNESS:  Nancy Beard is Beard 56 y.o. female.  Would medical history significant for type both thyroid is patient is on Cytomel and Synthroid. Patient's last mammogram was about 5 years ago. Recently she underwent Beard screening mammogram that showed Beard spiculated mass in the lower inner quadrant of the right breast. She had ultrasound performed that showed irregular mass at the 5:00 position 3 cm from the nipple measuring 1.1 cm. She had Beard needle core biopsy performed on 06/19/2012 the biopsy showed invasive ductal carcinoma with ductal carcinoma in situ grade 3 tumor was ER negative PR negative HER-2/neu negative. Ki-67 was elevated at 34%. She went on to have MRI of the breasts performed on 06/24/2012 the MRI showed in the middle third of the lower inner quadrant of the right breast Beard 1.5 x 1.3 x 1.9 cm irregular enhancing mass. No abnormal enhancement was seen in the left breast no enlarged axillary or internal mammary adenopathy was detected. Patient is now seen in the multidisciplinary breast clinic for discussion of her treatment options. Her case was discussed at the multidisciplinary breast conference. Her pathology and radiology were reviewed. Recommendations made are based on NCCN guidelines for triple-negative stage I breast cancer. She  is without any complaints she is accompanied by her husband.   Past Medical History: Past Medical History  Diagnosis Date  . Thyroid disease   . Depression   . Sprue   . Diverticulosis   . Hyperlipidemia   . Breast cancer     Past Surgical History: Past Surgical History  Procedure Date  . Esophagogastroduodenoscopy 2007    sprue  . Colonoscopy 2006  . Cryoblation of cervix   . Breast lumpectomy     Family History: History reviewed. No pertinent family history.  Social History History  Substance Use Topics  . Smoking status: Never Smoker   . Smokeless tobacco: Not on file  . Alcohol Use: No    Allergies: No Known Allergies  Current Medications: Current Outpatient Prescriptions  Medication Sig Dispense Refill  . acetaminophen (TYLENOL) 325 MG tablet Take 650 mg by mouth as needed.      [name] liothyronine (CYTOMEL) 25 MCG tablet TAKE 1 TABLET DAILY  90 tablet  3  . sulfamethoxazole-trimethoprim (BACTRIM DS) 800-160 MG per tablet Take 1 tablet by mouth 2 (two) times daily.  20 tablet  0  . SYNTHROID 25 MCG tablet Take 1 tablet (25 mcg total) by mouth daily.  90 each  3    OB/GYN History: Patient had menarche at age 56 she underwent menopause in 2000 she has never been on hormone replacement therapy she said 1 live birth at the age of 29 she has used birth control pills for about 10 years in the past.  Fertility Discussion: Not applicable Prior History of Cancer: No  Health Maintenance:  Colonoscopy yes Bone Density yes Last PAP smear December 2013  ECOG PERFORMANCE STATUS: 0 - Asymptomatic  Genetic Counseling/testing: Patient is triple negative breast cancer under the age of 60 there for she will be referred to genetic for counseling and testing.  REVIEW OF SYSTEMS:  Beard comprehensive review of systems was negative.  PHYSICAL EXAMINATION: Blood pressure 150/82, pulse 77, temperature 97.6 F (36.4 C), resp. rate 20, height 5\' 6"  (1.676 m), weight 153 lb 3.2 oz  (69.491 kg).  WUJ:WJXBJ, healthy, no distress, well nourished and well developed SKIN: skin color, texture, turgor are normal HEAD: Normocephalic EYES: PERRLA, EOMI, Conjunctiva are pink and non-injected EARS: External ears normal OROPHARYNX:no exudate and lips, buccal mucosa, and tongue normal  NECK: supple, no adenopathy LYMPH:  no palpable lymphadenopathy, no hepatosplenomegaly BREAST: Right breast reveals palpable hematoma in the lower inner quadrant no other masses are noted no nipple discharge inversion or retraction left breast no masses or nipple discharge. LUNGS: clear to auscultation and percussion HEART: regular rate & rhythm ABDOMEN:abdomen soft, non-tender, obese, normal bowel sounds and no masses or organomegaly BACK: Back symmetric, no curvature., No CVA tenderness EXTREMITIES:less then 2 second capillary refill, no edema, no clubbing, no cyanosis  NEURO: alert & oriented x 3 with fluent speech, no focal motor/sensory deficits, gait normal, reflexes normal and symmetric     STUDIES/RESULTS: US Breast Right  06/18/2012  *RADIOLOGY REPORT*  Clinical Data:  The patient returns for evaluation of Beard possible mass in the right lower inner quadrant noted on recent screening study from [facility] dated 05/19/2012.  DIGITAL DIAGNOSTIC RIGHT MAMMOGRAM  AND RIGHT BREAST ULTRASOUND:  Comparison:  01/09/2007 and 12/19/2005 from [facility]  Findings:  Breast Density: ACR Category 3: The breast tissue is heterogeneously dense.  Additional views confirm the presence of Beard spiculated mass with microcalcifications in the right lower inner quadrant.  On physical exam, no mass is palpated in the right lower inner quadrant.  Ultrasound is performed, showing an irregular hypoechoic mass with microcalcifications at 5 o'clock 3 cm from the right nipple measuring 1.1 x 0.9 x 0.7 cm.  Sonography of the right axilla demonstrates no abnormal lymph nodes.  The appearance  is highly suspicious for invasive mammary carcinoma and biopsy is recommended.  Ultrasound-guided core needle biopsy was discussed with the patient and she agreed with this plan.  IMPRESSION: Spiculated mass with microcalcifications at 5 o'clock 3 cm from the right nipple.  RECOMMENDATION: Ultrasound-guided core needle biopsy is recommended.  This will be performed reported separately.  I have discussed the findings and recommendations with the patient. Results were also provided in writing at the conclusion of the visit.  Report was telephoned to Marijo Sanes at Dr. Ewell Poe office.  BI-RADS CATEGORY 5:  Highly suggestive of malignancy - appropriate action should be taken.   Original Report Authenticated By: Cain Saupe, M.D.    Mr Breast Bilateral W Wo Contrast  06/24/2012  *RADIOLOGY REPORT*  Clinical Data: Recently diagnosed invasive ductal carcinoma and DCIS in the 5 o'clock region of the right breast.  BILATERAL BREAST MRI WITH AND WITHOUT CONTRAST  Technique: Multiplanar, multisequence MR images of both breasts were obtained prior to and following the intravenous administration of 14ml of multihance.  Three dimensional images were evaluated at the independent DynaCad workstation.  Comparison:  Mammograms dated 06/18/2012, 01/09/2007 and 12/19/2005.  Findings: There is Beard dense enhancing fibronodular background parenchymal pattern.  In the middle third of the lower inner  quadrant of the right breast there is Beard 1.5 x 1.3 x 1.9 cm (anterior-posterior, transverse and longitudinal dimensions) irregular, enhancing mass corresponding well with the patients known malignancy.  There is an associated biopsy clip artifact.  No abnormal enhancement is seen in the left breast.  No enlarged axillary or internal mammary adenopathy is detected.  IMPRESSION: Solitary enhancing mass in the lower inner quadrant of the right breast corresponding with the known malignancy.  RECOMMENDATION: Treatment planning is recommended.   THREE-DIMENSIONAL MR IMAGE RENDERING ON INDEPENDENT WORKSTATION:  Three-dimensional MR images were rendered by post-processing of the original MR data on an independent workstation.  The three- dimensional MR images were interpreted, and findings were reported in the accompanying complete MRI report for this study.  BI-RADS CATEGORY 6:  Known biopsy-proven malignancy - appropriate action should be taken.   Original Report Authenticated By: Baird Lyons, M.D.    [place] Core Biopsy  06/18/2012  *RADIOLOGY REPORT*  Clinical Data:  Suspicious spiculated mass with microcalcifications at 5 o'clock 3 cm from the right nipple  ULTRASOUND GUIDED VACUUM ASSISTED CORE BIOPSY OF THE RIGHT BREAST  The patient and I discussed the procedure of ultrasound-guided biopsy, including benefits and alternatives.  We discussed the high likelihood of Beard successful procedure. We discussed the risks of the procedure including infection, bleeding, tissue injury, clip migration, and inadequate sampling.  Informed written consent was given.  Using sterile technique, 2% lidocaine, ultrasound guidance, and Beard 12 gauge vacuum assisted needle, biopsy was performed of the mass at 5 o'clock 3 cm from the right nipple using Beard caudocranial approach.  At the conclusion of the procedure, Beard ribbon tissue marker clip was deployed into the biopsy cavity.  Follow-up 2-view mammogram was performed and dictated separately.  IMPRESSION: Ultrasound-guided biopsy of Beard mass at 5 o'clock 3 cm in the right nipple.  No apparent complications.   Original Report Authenticated By: Cain Saupe, M.D.    Mm Digital Diagnostic Unilat R  06/18/2012  *RADIOLOGY REPORT*  Clinical Data:  Ultrasound-guided core needle biopsy of Beard mass at 5 o'clock 3 cm from the right nipple with clip placement.  DIGITAL DIAGNOSTIC RIGHT MAMMOGRAM  Comparison:  Previous exams.  Findings:  Films are performed following ultrasound guided biopsy of the mass at 5 o'clock 3 cm from the right nipple.   The InRad ribbon clip is appropriately positioned.  IMPRESSION: Appropriate clip placement following ultrasound-guided core needle biopsy of Beard mass at 5 o'clock 3 cm from the right nipple.   Original Report Authenticated By: Cain Saupe, M.D.    Mm Digital Diagnostic Unilat R  06/18/2012  *RADIOLOGY REPORT*  Clinical Data:  The patient returns for evaluation of Beard possible mass in the right lower inner quadrant noted on recent screening study from [facility] dated 05/19/2012.  DIGITAL DIAGNOSTIC RIGHT MAMMOGRAM  AND RIGHT BREAST ULTRASOUND:  Comparison:  01/09/2007 and 12/19/2005 from the [facility]  Findings:  Breast Density: ACR Category 3: The breast tissue is heterogeneously dense.  Additional views confirm the presence of Beard spiculated mass with microcalcifications in the right lower inner quadrant.  On physical exam, no mass is palpated in the right lower inner quadrant.  Ultrasound is performed, showing an irregular hypoechoic mass with microcalcifications at 5 o'clock 3 cm from the right nipple measuring 1.1 x 0.9 x 0.7 cm.  Sonography of the right axilla demonstrates no abnormal lymph nodes.  The appearance is highly suspicious for invasive mammary carcinoma and biopsy  is recommended.  Ultrasound-guided core needle biopsy was discussed with the patient and she agreed with this plan.  IMPRESSION: Spiculated mass with microcalcifications at 5 o'clock 3 cm from the right nipple.  RECOMMENDATION: Ultrasound-guided core needle biopsy is recommended.  This will be performed reported separately.  I have discussed the findings and recommendations with the patient. Results were also provided in writing at the conclusion of the visit.  Report was telephoned to Marijo Sanes at Dr. Ewell Poe office.  BI-RADS CATEGORY 5:  Highly suggestive of malignancy - appropriate action should be taken.   Original Report Authenticated By: Cain Saupe, M.D.    Mm Radiologist Eval And  Mgmt  06/19/2012  *RADIOLOGY REPORT*  ESTABLISHED PATIENT OFFICE VISIT - LEVEL II [phone])  Chief Complaint:  Right breast mass.  History: Follow-up ultrasound guided right core biopsy.  Exam: The biopsy site is clean and dry.  Steri-Strips remain in place.  No significant tenderness is reported by the patient.  Pathology: Invasive and in situ carcinoma, concordant with imaging findings.  Assessment and Plan: The patient has Beard breast MRI scheduled 06/24/2012 at 09:45 Beard.m. She will be seen at Multidisciplinary Clinic on 07/02/2012.   Original Report Authenticated By: Vincenza Hews, M.D.      LABS:    Chemistry      Component Value Date/Time   NA 146* 07/02/2012 0825   NA 143 07/18/2011 0924   K 3.3* 07/02/2012 0825   K 5.3* 07/18/2011 0924   CL 104 07/02/2012 0825   CL 108 07/18/2011 0924   CO2 26 07/02/2012 0825   CO2 28 07/18/2011 0924   BUN 13.0 07/02/2012 0825   BUN 10 07/18/2011 0924   CREATININE 0.7 07/02/2012 0825   CREATININE 0.7 07/18/2011 0924      Component Value Date/Time   CALCIUM 9.0 07/02/2012 0825   CALCIUM 9.4 07/18/2011 0924   ALKPHOS 150 07/02/2012 0825   ALKPHOS 117 11/15/2011 0759   AST 28 07/02/2012 0825   AST 31 11/15/2011 0759   ALT 32 07/02/2012 0825   ALT 27 11/15/2011 0759   BILITOT 0.45 07/02/2012 0825   BILITOT 0.4 11/15/2011 0759      Lab Results  Component Value Date   WBC 5.9 07/02/2012   HGB 11.9 07/02/2012   HCT 36.0 07/02/2012   MCV 75.7* 07/02/2012   PLT 259 07/02/2012   PATHOLOGY: ADDITIONAL INFORMATION: PROGNOSTIC INDICATORS - ACIS Results IMMUNOHISTOCHEMICAL AND MORPHOMETRIC ANALYSIS BY THE AUTOMATED CELLULAR IMAGING SYSTEM (ACIS) Estrogen Receptor (Negative, <1%): 0%, NEGATIVE Progesterone Receptor (Negative, <1%): 0%, NEGATIVE Proliferation Marker Ki67 by M IB-1 (Low<20%): 34% COMMENT: The negative hormone receptor study(ies) in this case have an internal positive control. All controls stained appropriately Pecola Leisure MD Pathologist,  Electronic Signature ( Signed 06/25/2012) CHROMOGENIC IN-SITU HYBRIDIZATION Interpretation HER-2/NEU BY CISH - NO AMPLIFICATION OF HER-2 DETECTED. THE RATIO OF HER-2: CEP 17 SIGNALS WAS 1.55. Reference range: Ratio: HER2:CEP17 < 1.8 - gene amplification not observed Ratio: HER2:CEP 17 1.8-2.2 - equivocal result Ratio: HER2:CEP17 > 2.2 - gene amplification observed 1 of 3 FINAL for Nancy Beard, Nancy Beard ([id number]) ADDITIONAL INFORMATION:(continued) Pecola Leisure MD Pathologist, Electronic Signature ( Signed 06/25/2012) FINAL DIAGNOSIS Diagnosis Breast, right, needle core biopsy, mass, 5 o'clock, 3 cm / nipple - INVASIVE DUCTAL CARCINOMA. - DUCTAL CARCINOMA IN SITU. Microscopic Comment There is invasive ductal carcinoma consistent with grade II/III. Breast prognostic profile will be performed and reported as an addendum. Dr. Colonel Bald agrees. Called to [facility] on 06/19/12. (  JDP:gt, 06/19/12) Jimmy Picket MD Pathologist, Electronic Signature (Case signed 06/19/2012) S ASSESSMENT    56 year old female with  #1 New diagnosis of 1.9 cm invasive ductal carcinoma that is ER negative PR negative HER-2/neu negative grade 3 with Beard Ki-67 of 34%. This was found on Beard screening mammogram. Patient desires breast conservation. Her case was discussed at the multidisciplinary breast conference. Recommendation of lumpectomy with sentinel lymph node biopsy of front was made. Patient was seen by Dr. Abbey Chatters for discussion of this.  #2 because her tumor is triple negative she will require adjuvant chemotherapy. We discussed the rationale of adjuvant therapy in this individual. My recommendation would be to use an anthracycline such as Adriamycin Cytoxan every 2 weeks for Beard total of 4 cycles followed by 12 weeks of Taxol and carboplatinum. We discussed the benefits as well as the risks and logistics of administering chemotherapy. She will need Beard Port-Beard-Cath placed.  #3 we discussed  genetic counseling and testing. Beard referral will be made to our genetic counselor.  #4 patient will need an echocardiogram as well as Beard chemotherapy at therapy class and I have set this up for her.  Clinical Trial Eligibility: No Multidisciplinary conference discussion yes    PLAN:    #1 patient will proceed with Beard lumpectomy and sentinel lymph node biopsy. We will also have Beard Port-Beard-Cath placed at the time of her surgery.  #2 we will also set her up for echocardiogram chemotherapy teaching class. All of her antiemetics and 8 EMLA cream were filled.  #3 patient will be seen back on January 27 4 possibly starting cycle 1 day 1 of her chemotherapy.       Discussion: Patient is being treated per NCCN breast cancer care guidelines appropriate for stage I   Thank you so much for allowing me to participate in the care of Nancy Beard. I will continue to follow up the patient with you and assist in her care.  All questions were answered. The patient knows to call the clinic with any problems, questions or concerns. We can certainly see the patient much sooner if necessary.  I spent 55 minutes counseling the patient face to face. The total time spent in the appointment was 60 minutes.   Drue Second, MD Medical/Oncology [facility] [phone] (beeper) [phone] (Office)  07/02/2012, 10:58 AM

## 2012-07-02 NOTE — Progress Notes (Signed)
Checked in new patient. No financial issues. °

## 2012-07-02 NOTE — Progress Notes (Signed)
Radiation Oncology         [phone]) [phone] ________________________________  Initial outpatient Consultation  Name: Nancy Beard MRN: [id number]  Date: 07/02/2012  DOB: 15-Jan-1956  REFERRING PHYSICIAN: Adolph Pollack, MD  DIAGNOSIS: T1cN0 triple negative right breast cancer  HISTORY OF PRESENT ILLNESS::Nancy Beard is a 56 y.o. female  underwent a screening mammogram. A mass was noted in the central lower aspect of the right breast. Ultrasound measured the mass at 1.0 x 0.9 cm. A biopsy was performed which showed a grade 3 triple negative invasive ductal carcinoma. The Ki-67 was 34%. MRI confirmed a solitary mass in the right breast measuring 1.5 x 1.3 x 1.9 cm. Nodes were negative. No abnormalities were seen in the left breast. She was asymptomatic prior to her biopsy. She reports a prior history of a lumpectomy on the left for fibrocystic disease.  PREVIOUS RADIATION THERAPY: No  PAST MEDICAL HISTORY:  has a past medical history of Thyroid disease; Depression; Sprue; Diverticulosis; Hyperlipidemia; and Breast cancer.    PAST SURGICAL HISTORY: Past Surgical History  Procedure Date  . Esophagogastroduodenoscopy 2007    sprue  . Colonoscopy 2006  . Cryoblation of cervix   . Breast lumpectomy     FAMILY HISTORY: family history is not on file.  SOCIAL HISTORY:  reports that she has never smoked. She does not have any smokeless tobacco history on file. She reports that she does not drink alcohol or use illicit drugs.  ALLERGIES: Review of patient's allergies indicates no known allergies.  MEDICATIONS:  Current Outpatient Prescriptions  Medication Sig Dispense Refill  . acetaminophen (TYLENOL) 325 MG tablet Take 650 mg by mouth as needed.      [name] dexamethasone (DECADRON) 4 MG tablet Take 2 tablets by mouth once a day on the day after chemotherapy and then take 2 tablets two times a day for 2 days. Take with food.  30 tablet  1  . lidocaine-prilocaine (EMLA) cream Apply  topically as needed.  30 g  8  . liothyronine (CYTOMEL) 25 MCG tablet TAKE 1 TABLET DAILY  90 tablet  3  . LORazepam (ATIVAN) 0.5 MG tablet Take 1 tablet (0.5 mg total) by mouth every 6 (six) hours as needed (Nausea or vomiting).  30 tablet  0  . ondansetron (ZOFRAN) 8 MG tablet Take 1 tablet (8 mg total) by mouth 2 (two) times daily as needed. Take two times a day as needed for nausea or vomiting starting on the third day after chemotherapy.  30 tablet  1  . prochlorperazine (COMPAZINE) 10 MG tablet Take 1 tablet (10 mg total) by mouth every 6 (six) hours as needed (Nausea or vomiting).  30 tablet  1  . prochlorperazine (COMPAZINE) 25 MG suppository Place 1 suppository (25 mg total) rectally every 12 (twelve) hours as needed for nausea.  12 suppository  3  . sulfamethoxazole-trimethoprim (BACTRIM DS) 800-160 MG per tablet Take 1 tablet by mouth 2 (two) times daily.  20 tablet  0  . SYNTHROID 25 MCG tablet Take 1 tablet (25 mcg total) by mouth daily.  90 each  3    REVIEW OF SYSTEMS:  A 15 point review of systems is documented in the electronic medical record. This was obtained by the nursing staff. However, I reviewed this with the patient to discuss relevant findings and make appropriate changes.  Pertinent items are noted in HPI.   PHYSICAL EXAM: She is a pleasant female in no distress sitting comfortably examining table.  She is normocephalic atraumatic. Her breasts are symmetric. She has a swell healed scar in the upper inner quadrant of the left breast. No palpable normality is of the left breast or left axilla. She has biopsy change in the lower aspect of the right breast. There really is not much in terms of biopsy change. She has no palpable right axillary lymph nodes. She is alert and oriented x3. Cranial nerves II through XII are tested and intact.  LABORATORY DATA:  Lab Results  Component Value Date   WBC 5.9 07/02/2012   HGB 11.9 07/02/2012   HCT 36.0 07/02/2012   MCV 75.7* 07/02/2012    PLT 259 07/02/2012   Lab Results  Component Value Date   NA 146* 07/02/2012   K 3.3* 07/02/2012   CL 104 07/02/2012   CO2 26 07/02/2012   Lab Results  Component Value Date   ALT 32 07/02/2012   AST 28 07/02/2012   ALKPHOS 150 07/02/2012   BILITOT 0.45 07/02/2012     RADIOGRAPHY: US Breast Right  06/18/2012  *RADIOLOGY REPORT*  Clinical Data:  The patient returns for evaluation of a possible mass in the right lower inner quadrant noted on recent screening study from [facility] dated 05/19/2012.  DIGITAL DIAGNOSTIC RIGHT MAMMOGRAM  AND RIGHT BREAST ULTRASOUND:  Comparison:  01/09/2007 and 12/19/2005 from the [facility]  Findings:  Breast Density: ACR Category 3: The breast tissue is heterogeneously dense.  Additional views confirm the presence of a spiculated mass with microcalcifications in the right lower inner quadrant.  On physical exam, no mass is palpated in the right lower inner quadrant.  Ultrasound is performed, showing an irregular hypoechoic mass with microcalcifications at 5 o'clock 3 cm from the right nipple measuring 1.1 x 0.9 x 0.7 cm.  Sonography of the right axilla demonstrates no abnormal lymph nodes.  The appearance is highly suspicious for invasive mammary carcinoma and biopsy is recommended.  Ultrasound-guided core needle biopsy was discussed with the patient and she agreed with this plan.  IMPRESSION: Spiculated mass with microcalcifications at 5 o'clock 3 cm from the right nipple.  RECOMMENDATION: Ultrasound-guided core needle biopsy is recommended.  This will be performed reported separately.  I have discussed the findings and recommendations with the patient. Results were also provided in writing at the conclusion of the visit.  Report was telephoned to Marijo Sanes at Dr. Ewell Poe office.  BI-RADS CATEGORY 5:  Highly suggestive of malignancy - appropriate action should be taken.   Original Report Authenticated By: Cain Saupe, M.D.     Mr Breast Bilateral W Wo Contrast  06/24/2012  *RADIOLOGY REPORT*  Clinical Data: Recently diagnosed invasive ductal carcinoma and DCIS in the 5 o'clock region of the right breast.  BILATERAL BREAST MRI WITH AND WITHOUT CONTRAST  Technique: Multiplanar, multisequence MR images of both breasts were obtained prior to and following the intravenous administration of 14ml of multihance.  Three dimensional images were evaluated at the independent DynaCad workstation.  Comparison:  Mammograms dated 06/18/2012, 01/09/2007 and 12/19/2005.  Findings: There is a dense enhancing fibronodular background parenchymal pattern.  In the middle third of the lower inner quadrant of the right breast there is a 1.5 x 1.3 x 1.9 cm (anterior-posterior, transverse and longitudinal dimensions) irregular, enhancing mass corresponding well with the patients known malignancy.  There is an associated biopsy clip artifact.  No abnormal enhancement is seen in the left breast.  No enlarged axillary or internal mammary adenopathy is detected.  IMPRESSION: Solitary enhancing mass in the lower inner quadrant of the right breast corresponding with the known malignancy.  RECOMMENDATION: Treatment planning is recommended.  THREE-DIMENSIONAL MR IMAGE RENDERING ON INDEPENDENT WORKSTATION:  Three-dimensional MR images were rendered by post-processing of the original MR data on an independent workstation.  The three- dimensional MR images were interpreted, and findings were reported in the accompanying complete MRI report for this study.  BI-RADS CATEGORY 6:  Known biopsy-proven malignancy - appropriate action should be taken.   Original Report Authenticated By: Baird Lyons, M.D.    [place] Core Biopsy  06/18/2012  *RADIOLOGY REPORT*  Clinical Data:  Suspicious spiculated mass with microcalcifications at 5 o'clock 3 cm from the right nipple  ULTRASOUND GUIDED VACUUM ASSISTED CORE BIOPSY OF THE RIGHT BREAST  The patient and I discussed the procedure of  ultrasound-guided biopsy, including benefits and alternatives.  We discussed the high likelihood of a successful procedure. We discussed the risks of the procedure including infection, bleeding, tissue injury, clip migration, and inadequate sampling.  Informed written consent was given.  Using sterile technique, 2% lidocaine, ultrasound guidance, and a 12 gauge vacuum assisted needle, biopsy was performed of the mass at 5 o'clock 3 cm from the right nipple using a caudocranial approach.  At the conclusion of the procedure, a ribbon tissue marker clip was deployed into the biopsy cavity.  Follow-up 2-view mammogram was performed and dictated separately.  IMPRESSION: Ultrasound-guided biopsy of a mass at 5 o'clock 3 cm in the right nipple.  No apparent complications.   Original Report Authenticated By: Cain Saupe, M.D.    Mm Digital Diagnostic Unilat R  06/18/2012  *RADIOLOGY REPORT*  Clinical Data:  Ultrasound-guided core needle biopsy of a mass at 5 o'clock 3 cm from the right nipple with clip placement.  DIGITAL DIAGNOSTIC RIGHT MAMMOGRAM  Comparison:  Previous exams.  Findings:  Films are performed following ultrasound guided biopsy of the mass at 5 o'clock 3 cm from the right nipple.  The InRad ribbon clip is appropriately positioned.  IMPRESSION: Appropriate clip placement following ultrasound-guided core needle biopsy of a mass at 5 o'clock 3 cm from the right nipple.   Original Report Authenticated By: Cain Saupe, M.D.    Mm Digital Diagnostic Unilat R  06/18/2012  *RADIOLOGY REPORT*  Clinical Data:  The patient returns for evaluation of a possible mass in the right lower inner quadrant noted on recent screening study from [facility] dated 05/19/2012.  DIGITAL DIAGNOSTIC RIGHT MAMMOGRAM  AND RIGHT BREAST ULTRASOUND:  Comparison:  01/09/2007 and 12/19/2005 from the [facility]  Findings:  Breast Density: ACR Category 3: The breast tissue is heterogeneously dense.   Additional views confirm the presence of a spiculated mass with microcalcifications in the right lower inner quadrant.  On physical exam, no mass is palpated in the right lower inner quadrant.  Ultrasound is performed, showing an irregular hypoechoic mass with microcalcifications at 5 o'clock 3 cm from the right nipple measuring 1.1 x 0.9 x 0.7 cm.  Sonography of the right axilla demonstrates no abnormal lymph nodes.  The appearance is highly suspicious for invasive mammary carcinoma and biopsy is recommended.  Ultrasound-guided core needle biopsy was discussed with the patient and she agreed with this plan.  IMPRESSION: Spiculated mass with microcalcifications at 5 o'clock 3 cm from the right nipple.  RECOMMENDATION: Ultrasound-guided core needle biopsy is recommended.  This will be performed reported separately.  I have discussed the findings and recommendations with the  patient. Results were also provided in writing at the conclusion of the visit.  Report was telephoned to Marijo Sanes at Dr. Ewell Poe office.  BI-RADS CATEGORY 5:  Highly suggestive of malignancy - appropriate action should be taken.   Original Report Authenticated By: Cain Saupe, M.D.    Mm Radiologist Eval And Mgmt  06/19/2012  *RADIOLOGY REPORT*  ESTABLISHED PATIENT OFFICE VISIT - LEVEL II [phone])  Chief Complaint:  Right breast mass.  History: Follow-up ultrasound guided right core biopsy.  Exam: The biopsy site is clean and dry.  Steri-Strips remain in place.  No significant tenderness is reported by the patient.  Pathology: Invasive and in situ carcinoma, concordant with imaging findings.  Assessment and Plan: The patient has a breast MRI scheduled 06/24/2012 at 09:45 a.m. She will be [facility] at [facility] on 07/02/2012.   Original Report Authenticated By: Vincenza Hews, M.D.      IMPRESSION: T1 C. N0 triple negative right breast cancer  PLAN: I spoke to Ms. Borelli and her family today. We discussed the equivalency  in terms of survival between mastectomy and lumpectomy. We discussed the necessity of a sentinel node procedure. We discussed the role of radiation in decreasing local failures in patients who undergo breast conservation. We discussed the process of simulation the placement tattoos. We discussed 4-6 weeks of treatment as an outpatient. We discussed the radiation will begin after chemotherapy. We discussed possible side effects including but not limited to fatigue and skin redness. She has met with Dr. Abbey Chatters as well as Dr. Welton Flakes. She met with our physical therapist as well as member of our patient family support team. I will plan on seeing her back once her chemotherapy is complete. I spent 60 minutes  face to face with the patient and more than 50% of that time was spent in counseling and/or coordination of care.   ------------------------------------------------  Lurline Hare, MD

## 2012-07-02 NOTE — Progress Notes (Signed)
Patient and hubby came back by inquiring about financial assistance. I gave them an application to fill out and send back to address on application. She did say she knows she has a high deductible on current plan.But she will have a new plan come 07/16/12. He will be getting some procedures done also. I did advise him as long as with [place]- the discount will apply to the both of them.

## 2012-07-02 NOTE — Patient Instructions (Addendum)
Proceed with surgery  Then chemotherapy

## 2012-07-07 ENCOUNTER — Encounter: Payer: Self-pay | Admitting: *Deleted

## 2012-07-07 ENCOUNTER — Telehealth: Payer: Self-pay | Admitting: *Deleted

## 2012-07-07 NOTE — Progress Notes (Signed)
Mailed after appt letter to pt. 

## 2012-07-07 NOTE — Telephone Encounter (Signed)
Spoke to pt concerning [facility] from 07/02/12.  Pt denies questions or concerns regarding dx.  Pt relayed she has changed her mind about surgery option.  She would like to have right mastectomy with reconstruction is negative.  If positive she would like to have bilateral mastectomies with reconstruction.  I have sent in basket to physician team.  Pt denies further needs or concerns at this time.  Encourage pt to call with needs.  Received verbal understanding.  Contact information given.

## 2012-07-14 ENCOUNTER — Encounter (INDEPENDENT_AMBULATORY_CARE_PROVIDER_SITE_OTHER): Payer: Self-pay | Admitting: General Surgery

## 2012-07-14 NOTE — Progress Notes (Signed)
Patient ID: Nancy Beard, female   DOB: 1956/01/23, 56 y.o.   MRN: [id number] After doing some research on her own and further thinking she would like to have a right mastectomy and if genetic testing is positive (this is to be done on 07/17/2012) she would like to have bilateral mastectomies. I spoke with her about this. We will cancel the surgery scheduled for 07/24/2012 and wait for the results of the genetic testing before scheduling the other surgery.

## 2012-07-16 DIAGNOSIS — I82409 Acute embolism and thrombosis of unspecified deep veins of unspecified lower extremity: Secondary | ICD-10-CM

## 2012-07-16 DIAGNOSIS — C50919 Malignant neoplasm of unspecified site of unspecified female breast: Secondary | ICD-10-CM

## 2012-07-16 DIAGNOSIS — Z5189 Encounter for other specified aftercare: Secondary | ICD-10-CM

## 2012-07-16 HISTORY — DX: Encounter for other specified aftercare: Z51.89

## 2012-07-16 HISTORY — DX: Malignant neoplasm of unspecified site of unspecified female breast: C50.919

## 2012-07-16 HISTORY — DX: Acute embolism and thrombosis of unspecified deep veins of unspecified lower extremity: I82.409

## 2012-07-17 ENCOUNTER — Ambulatory Visit (HOSPITAL_BASED_OUTPATIENT_CLINIC_OR_DEPARTMENT_OTHER): Payer: Private Health Insurance - Indemnity | Admitting: Genetic Counselor

## 2012-07-17 ENCOUNTER — Other Ambulatory Visit: Payer: Private Health Insurance - Indemnity | Admitting: Lab

## 2012-07-17 ENCOUNTER — Encounter: Payer: Self-pay | Admitting: Genetic Counselor

## 2012-07-17 DIAGNOSIS — C50319 Malignant neoplasm of lower-inner quadrant of unspecified female breast: Secondary | ICD-10-CM

## 2012-07-17 NOTE — Progress Notes (Signed)
Dr.  Drue Second requested a consultation for genetic counseling and risk assessment for Nancy Beard, a 57 y.o. female, for discussion of her personal and family history of breast cancer. She presents to clinic today to discuss the possibility of a genetic predisposition to cancer, and to further clarify her risks, as well as her family members' risks for cancer.   HISTORY OF PRESENT ILLNESS: In 2013, at the age of 34, Nancy Beard was diagnosed with triple negative invasive ductal carcinoma. This will be treated with surgery and chemotherapy.    Past Medical History  Diagnosis Date  . Thyroid disease   . Depression   . Sprue   . Diverticulosis   . Hyperlipidemia   . Breast cancer     Past Surgical History  Procedure Date  . Esophagogastroduodenoscopy 2007    sprue  . Colonoscopy 2006  . Cryoblation of cervix   . Breast lumpectomy     History  Substance Use Topics  . Smoking status: Never Smoker   . Smokeless tobacco: Not on file  . Alcohol Use: No    REPRODUCTIVE HISTORY AND PERSONAL RISK ASSESSMENT FACTORS: Menarche was at age 26.   Menopause at 43 Uterus Intact: Yes Ovaries Intact: Yes G2P1A1 , first live birth at age 50  She has not previously undergone treatment for infertility.   OCP use for 10 years   She has not used HRT in the past.    FAMILY HISTORY:  We obtained a detailed, 4-generation family history.  Significant diagnoses are listed below: Family History  Problem Relation Age of Onset  . Breast cancer Paternal Aunt     dx in her 4s  . Lymphoma Paternal Uncle   . Breast cancer Paternal Grandmother     dx >50  . Heart attack Paternal Grandfather   . Breast cancer Other     2 maternal great aunts with breast cancer >50  The patient was diagnosed with breast cancer at age 62.  Her paternal aunt and paternal grandmother were both diagnosed with breast cancer over the age of 36.  Additionally, her paternal uncle has been diagnosed with  lymphoma. The patient's maternal grandmother had two sisters who were diagnosed with breast cancer over the age of 12.  There is no other reported history of cancer.  Patient's maternal ancestors are of [place] and [place] descent, and paternal ancestors are of [place] descent. There is no reported Ashkenazi Jewish ancestry. There is no  known consanguinity.  GENETIC COUNSELING RISK ASSESSMENT, DISCUSSION, AND SUGGESTED FOLLOW UP: We reviewed the natural history and genetic etiology of sporadic, familial and hereditary cancer syndromes.  About 5-10% of breast cancer is hereditary.  Of this, about 85% is the result of a BRCA1 or BRCA2 mutation.  We reviewed the red flags of hereditary cancer syndromes and the dominant inheritance patterns.  If the BRCA testing is negative, we discussed that we could be testing for the wrong gene.  We discussed gene panels, and that several cancer genes that are associated with different cancers can be tested at the same time.  Because of the different types of cancer that are in the patient's family, we will consider one of the panel tests if she is negative for BRCA mutations.   The patient's personal and family history is suggestive of the following possible diagnosis: hereditary cancer syndrome  We discussed that identification of a hereditary cancer syndrome may help her care providers tailor the patients medical management. If  a mutation indicating a hereditary cancer syndroem is detected in this case, the [employer] recommendations would include increased cancer surveillance and possible prophylactic surgery. If a mutation is detected, the patient will be referred back to the referring provider and to any additional appropriate care providers to discuss the relevant options.   If a mutation is not found in the patient, this will decrease the likelihood of a hereditary cancer syndrome as the explanation for her breast cancer. Cancer  surveillance options would be discussed for the patient according to the appropriate standard National Comprehensive Cancer Network and American Cancer Society guidelines, with consideration of their personal and family history risk factors. In this case, the patient will be referred back to their care providers for discussions of management.   After considering the risks, benefits, and limitations, the patient provided informed consent for  the following  Testing:  BRACAnalsysis and MyRisk through [employer].   Per the patient's request, we will contact her by telephone to discuss these results. A follow up genetic counseling visit will be scheduled if indicated.  The patient was seen for a total of 60 minutes, greater than 50% of which was spent face-to-face counseling.  This plan is being carried out per Dr. Feliz Beam recommendations.  This note will also be sent to the referring provider via the electronic medical record. The patient will be supplied with a summary of this genetic counseling discussion as well as educational information on the discussed hereditary cancer syndromes following the conclusion of their visit.   Patient was discussed with Dr. Drue Second. _______________________________________________________________________ For Office Staff:  Number of people involved in session: 2 Was an Intern/ student involved with case: no }

## 2012-07-18 ENCOUNTER — Other Ambulatory Visit (INDEPENDENT_AMBULATORY_CARE_PROVIDER_SITE_OTHER): Payer: Private Health Insurance - Indemnity

## 2012-07-18 DIAGNOSIS — Z0000 Encounter for general adult medical examination without abnormal findings: Secondary | ICD-10-CM

## 2012-07-18 LAB — HEPATIC FUNCTION PANEL
ALT: 20 U/L (ref 0–35)
AST: 23 U/L (ref 0–37)
Albumin: 4 g/dL (ref 3.5–5.2)
Alkaline Phosphatase: 107 U/L (ref 39–117)
Bilirubin, Direct: 0 mg/dL (ref 0.0–0.3)
Total Bilirubin: 0.8 mg/dL (ref 0.3–1.2)
Total Protein: 7.5 g/dL (ref 6.0–8.3)

## 2012-07-18 LAB — BASIC METABOLIC PANEL
BUN: 13 mg/dL (ref 6–23)
CO2: 30 mEq/L (ref 19–32)
Calcium: 9.6 mg/dL (ref 8.4–10.5)
Chloride: 105 mEq/L (ref 96–112)
Creatinine, Ser: 0.6 mg/dL (ref 0.4–1.2)
GFR: 111.43 mL/min (ref >60.00)
Glucose, Bld: 91 mg/dL (ref 70–99)
Potassium: 4.2 mEq/L (ref 3.5–5.1)
Sodium: 141 mEq/L (ref 135–145)

## 2012-07-18 LAB — POCT URINALYSIS DIPSTICK
Bilirubin, UA: NEGATIVE
Blood, UA: NEGATIVE
Glucose, UA: NEGATIVE
Ketones, UA: NEGATIVE
Leukocytes, UA: NEGATIVE
Nitrite, UA: NEGATIVE
Protein, UA: NEGATIVE
Spec Grav, UA: 1.015
Urobilinogen, UA: 0.2
pH, UA: 6

## 2012-07-18 LAB — CBC WITH DIFFERENTIAL/PLATELET
Basophils Absolute: 0.1 10*3/uL (ref 0.0–0.1)
Basophils Relative: 1 % (ref 0.0–3.0)
Eosinophils Absolute: 0.2 10*3/uL (ref 0.0–0.7)
Eosinophils Relative: 2.8 % (ref 0.0–5.0)
HCT: 37.3 % (ref 36.0–46.0)
Hemoglobin: 12.1 g/dL (ref 12.0–15.0)
Lymphocytes Relative: 43.2 % (ref 12.0–46.0)
Lymphs Abs: 2.8 10*3/uL (ref 0.7–4.0)
MCHC: 32.5 g/dL (ref 30.0–36.0)
MCV: 75.9 fl — ABNORMAL LOW (ref 78.0–100.0)
Monocytes Absolute: 0.6 10*3/uL (ref 0.1–1.0)
Monocytes Relative: 9.6 % (ref 3.0–12.0)
Neutro Abs: 2.8 10*3/uL (ref 1.4–7.7)
Neutrophils Relative %: 43.4 % (ref 43.0–77.0)
Platelets: 306 10*3/uL (ref 150.0–400.0)
RBC: 4.92 Mil/uL (ref 3.87–5.11)
RDW: 15.8 % — ABNORMAL HIGH (ref 11.5–14.6)
WBC: 6.4 10*3/uL (ref 4.5–10.5)

## 2012-07-18 LAB — LIPID PANEL
Cholesterol: 169 mg/dL (ref 0–200)
HDL: 43.8 mg/dL (ref >39.00)
LDL Cholesterol: 110 mg/dL — ABNORMAL HIGH (ref 0–99)
Total CHOL/HDL Ratio: 4
Triglycerides: 76 mg/dL (ref 0.0–149.0)
VLDL: 15.2 mg/dL (ref 0.0–40.0)

## 2012-07-18 LAB — TSH: TSH: 2.51 u[IU]/mL (ref 0.35–5.50)

## 2012-07-22 ENCOUNTER — Encounter: Payer: Self-pay | Admitting: *Deleted

## 2012-07-22 ENCOUNTER — Other Ambulatory Visit: Payer: Private Health Insurance - Indemnity

## 2012-07-24 ENCOUNTER — Encounter (HOSPITAL_BASED_OUTPATIENT_CLINIC_OR_DEPARTMENT_OTHER): Admission: RE | Payer: Self-pay | Source: Ambulatory Visit

## 2012-07-24 ENCOUNTER — Ambulatory Visit (HOSPITAL_BASED_OUTPATIENT_CLINIC_OR_DEPARTMENT_OTHER)
Admission: RE | Admit: 2012-07-24 | Payer: Private Health Insurance - Indemnity | Source: Ambulatory Visit | Admitting: General Surgery

## 2012-07-24 ENCOUNTER — Encounter (HOSPITAL_COMMUNITY): Payer: Private Health Insurance - Indemnity

## 2012-07-24 SURGERY — PARTIAL MASTECTOMY WITH NEEDLE LOCALIZATION AND AXILLARY SENTINEL LYMPH NODE BX
Anesthesia: General | Site: Breast | Laterality: Right

## 2012-07-25 ENCOUNTER — Encounter: Payer: Self-pay | Admitting: Internal Medicine

## 2012-07-25 ENCOUNTER — Ambulatory Visit (INDEPENDENT_AMBULATORY_CARE_PROVIDER_SITE_OTHER): Payer: Private Health Insurance - Indemnity | Admitting: Internal Medicine

## 2012-07-25 ENCOUNTER — Telehealth: Payer: Self-pay | Admitting: *Deleted

## 2012-07-25 VITALS — BP 130/80 | HR 72 | Temp 98.2°F | Resp 16 | Ht 66.0 in | Wt 150.0 lb

## 2012-07-25 DIAGNOSIS — Z0000 Encounter for general adult medical examination without abnormal findings: Secondary | ICD-10-CM

## 2012-07-25 NOTE — Progress Notes (Signed)
Subjective:    Patient ID: Nancy Beard, female    DOB: [birth year], 57 y.o.   MRN: [id number]  HPI Breast cancer diagnosis Chemotherapy  Echocardiogram ? Radiation? Dr Abbey Chatters. Diet gluten free     Review of Systems  Constitutional: Negative for activity change, appetite change and fatigue.  HENT: Negative for ear pain, congestion, neck pain, postnasal drip and sinus pressure.   Eyes: Negative for redness and visual disturbance.  Respiratory: Negative for cough, shortness of breath and wheezing.   Gastrointestinal: Negative for abdominal pain and abdominal distention.  Genitourinary: Negative for dysuria, frequency and menstrual problem.  Musculoskeletal: Negative for myalgias, joint swelling and arthralgias.  Skin: Negative for rash and wound.  Neurological: Negative for dizziness, weakness and headaches.  Hematological: Negative for adenopathy. Does not bruise/bleed easily.  Psychiatric/Behavioral: Negative for sleep disturbance and decreased concentration.   Past Medical History  Diagnosis Date  . Thyroid disease   . Depression   . Sprue   . Diverticulosis   . Hyperlipidemia   . Breast cancer     History   Social History  . Marital Status: Married    Spouse Name: N/A    Number of Children: N/A  . Years of Education: N/A   Occupational History  . Not on file.   Social History Main Topics  . Smoking status: Never Smoker   . Smokeless tobacco: Not on file  . Alcohol Use: No  . Drug Use: No  . Sexually Active: Yes   Other Topics Concern  . Not on file   Social History Narrative  . No narrative on file    Past Surgical History  Procedure Date  . Esophagogastroduodenoscopy 2007    sprue  . Colonoscopy 2006  . Cryoblation of cervix   . Breast lumpectomy     Family History  Problem Relation Age of Onset  . Breast cancer Paternal Aunt     dx in her 14s  . Lymphoma Paternal Uncle   . Breast cancer Paternal Grandmother     dx >50  . Heart  attack Paternal Grandfather   . Breast cancer Other     2 maternal great aunts with breast cancer >50    No Known Allergies  Current Outpatient Prescriptions on File Prior to Visit  Medication Sig Dispense Refill  . acetaminophen (TYLENOL) 325 MG tablet Take 650 mg by mouth as needed.      [name] dexamethasone (DECADRON) 4 MG tablet Take 2 tablets by mouth once a day on the day after chemotherapy and then take 2 tablets two times a day for 2 days. Take with food.  30 tablet  1  . lidocaine-prilocaine (EMLA) cream Apply topically as needed.  30 g  8  . liothyronine (CYTOMEL) 25 MCG tablet TAKE 1 TABLET DAILY  90 tablet  3  . LORazepam (ATIVAN) 0.5 MG tablet Take 1 tablet (0.5 mg total) by mouth every 6 (six) hours as needed (Nausea or vomiting).  30 tablet  0  . ondansetron (ZOFRAN) 8 MG tablet Take 1 tablet (8 mg total) by mouth 2 (two) times daily as needed. Take two times a day as needed for nausea or vomiting starting on the third day after chemotherapy.  30 tablet  1  . prochlorperazine (COMPAZINE) 10 MG tablet Take 1 tablet (10 mg total) by mouth every 6 (six) hours as needed (Nausea or vomiting).  30 tablet  1  . prochlorperazine (COMPAZINE) 25 MG suppository Place 1 suppository (25 mg  total) rectally every 12 (twelve) hours as needed for nausea.  12 suppository  3  . SYNTHROID 25 MCG tablet Take 1 tablet (25 mcg total) by mouth daily.  90 each  3    BP 130/80  Pulse 72  Temp 98.2 F (36.8 C)  Resp 16  Ht 5\' 6"  (1.676 m)  Wt 150 lb (68.04 kg)  BMI 24.21 kg/m2        Objective:   Physical Exam  Nursing note and vitals reviewed. Constitutional: She is oriented to person, place, and time. She appears well-developed and well-nourished. No distress.  HENT:  Head: Normocephalic and atraumatic.  Right Ear: External ear normal.  Left Ear: External ear normal.  Nose: Nose normal.  Mouth/Throat: Oropharynx is clear and moist.  Eyes: Conjunctivae normal and EOM are normal. Pupils are  equal, round, and reactive to light.  Neck: Normal range of motion. Neck supple. No JVD present. No tracheal deviation present. No thyromegaly present.  Cardiovascular: Normal rate, regular rhythm, normal heart sounds and intact distal pulses.   No murmur heard. Pulmonary/Chest: Effort normal and breath sounds normal. She has no wheezes. She exhibits no tenderness.  Abdominal: Soft. Bowel sounds are normal.  Musculoskeletal: Normal range of motion. She exhibits no edema and no tenderness.  Lymphadenopathy:    She has no cervical adenopathy.  Neurological: She is alert and oriented to person, place, and time. She has normal reflexes. No cranial nerve deficit.  Skin: Skin is warm and dry. She is not diaphoretic.  Psychiatric: She has a normal mood and affect. Her behavior is normal.          Assessment & Plan:   This is a routine physical examination for this healthy  Female. Reviewed all health maintenance protocols including mammography colonoscopy bone density and reviewed appropriate screening labs. Her immunization history was reviewed as well as her current medications and allergies refills of her chronic medications were given and the plan for yearly health maintenance was discussed all orders and referrals were made as appropriate.

## 2012-07-25 NOTE — Patient Instructions (Addendum)
The patient is instructed to continue all medications as prescribed. Schedule followup with check out clerk upon leaving the clinic  

## 2012-07-25 NOTE — Telephone Encounter (Signed)
Per staff messge from [place], I have canceled appts. JMW

## 2012-07-29 ENCOUNTER — Ambulatory Visit (HOSPITAL_COMMUNITY)
Admission: RE | Admit: 2012-07-29 | Discharge: 2012-07-29 | Disposition: A | Discharge status: Home / Self Care | Payer: Private Health Insurance - Indemnity | Source: Ambulatory Visit | Attending: Oncology | Admitting: Oncology

## 2012-07-29 DIAGNOSIS — I079 Rheumatic tricuspid valve disease, unspecified: Secondary | ICD-10-CM | POA: Insufficient documentation

## 2012-07-29 DIAGNOSIS — Z01818 Encounter for other preprocedural examination: Secondary | ICD-10-CM | POA: Insufficient documentation

## 2012-07-29 DIAGNOSIS — C50319 Malignant neoplasm of lower-inner quadrant of unspecified female breast: Secondary | ICD-10-CM | POA: Insufficient documentation

## 2012-07-29 NOTE — Progress Notes (Signed)
Echocardiogram 2D Echocardiogram has been performed.  Nancy Beard 07/29/2012, 11:49 AM

## 2012-08-01 ENCOUNTER — Encounter: Payer: Self-pay | Admitting: *Deleted

## 2012-08-04 ENCOUNTER — Telehealth: Payer: Self-pay | Admitting: Oncology

## 2012-08-04 NOTE — Telephone Encounter (Signed)
s/w pt and advised on cancelled appt....pt was already aware

## 2012-08-07 ENCOUNTER — Telehealth: Payer: Self-pay | Admitting: *Deleted

## 2012-08-07 NOTE — Telephone Encounter (Signed)
Pt called questioning genetic results.  Informed pt that I have left message for genetic counselor.  Pt denies further needs at this time.

## 2012-08-11 ENCOUNTER — Ambulatory Visit: Payer: Private Health Insurance - Indemnity

## 2012-08-11 ENCOUNTER — Other Ambulatory Visit: Payer: Private Health Insurance - Indemnity | Admitting: Lab

## 2012-08-11 ENCOUNTER — Telehealth: Payer: Self-pay | Admitting: Internal Medicine

## 2012-08-11 ENCOUNTER — Ambulatory Visit: Payer: Private Health Insurance - Indemnity | Admitting: Oncology

## 2012-08-11 DIAGNOSIS — E039 Hypothyroidism, unspecified: Secondary | ICD-10-CM

## 2012-08-11 MED ORDER — SYNTHROID 25 MCG PO TABS: 25.0000 ug | ORAL_TABLET | Freq: Every day | ORAL | Status: DC

## 2012-08-11 NOTE — Telephone Encounter (Signed)
Patient is totally out of SYNTHROID 25 MCG tablet. [facility] should have asked [place] for a refill. They state they sent [place] a request back in Nov 2013, but I do not see one. Pt would like [facility] [address], as she cannot wait for mail order, 90-day supply.   Please advise. Pt last OV 07/25/12 for cpx.

## 2012-08-12 ENCOUNTER — Ambulatory Visit: Payer: Private Health Insurance - Indemnity

## 2012-08-13 ENCOUNTER — Telehealth: Payer: Self-pay | Admitting: Internal Medicine

## 2012-08-13 NOTE — Telephone Encounter (Signed)
Talked with pharmacy and they stated I had called in with daw a nd I sent through computer normal- told them generic,please

## 2012-08-13 NOTE — Telephone Encounter (Signed)
Pharm called. Pt is requesting generic for SYNTHROID 25 MCG tablet and script was written brand name only. Pls advise.     [phone]

## 2012-08-14 ENCOUNTER — Telehealth: Payer: Self-pay | Admitting: Genetic Counselor

## 2012-08-14 NOTE — Telephone Encounter (Signed)
Revealed negative genetic test results.  We discussed the VUS' found including CDKN2A and MUTYH.

## 2012-08-15 ENCOUNTER — Telehealth: Payer: Self-pay | Admitting: *Deleted

## 2012-08-15 NOTE — Telephone Encounter (Signed)
Pt called and stated that after receiving her genetic results she has decided she would like to have bilateral mastectomy with immediate reconstruction.  Informed MD team of her decision.

## 2012-08-18 ENCOUNTER — Other Ambulatory Visit (INDEPENDENT_AMBULATORY_CARE_PROVIDER_SITE_OTHER): Payer: Self-pay | Admitting: General Surgery

## 2012-08-18 DIAGNOSIS — C50919 Malignant neoplasm of unspecified site of unspecified female breast: Secondary | ICD-10-CM

## 2012-08-20 ENCOUNTER — Encounter (INDEPENDENT_AMBULATORY_CARE_PROVIDER_SITE_OTHER): Payer: Self-pay | Admitting: General Surgery

## 2012-08-20 ENCOUNTER — Ambulatory Visit (INDEPENDENT_AMBULATORY_CARE_PROVIDER_SITE_OTHER): Payer: Private Health Insurance - Indemnity | Admitting: General Surgery

## 2012-08-20 VITALS — BP 144/92 | HR 78 | Temp 99.0°F | Resp 14 | Ht 66.0 in | Wt 146.4 lb

## 2012-08-20 DIAGNOSIS — C50919 Malignant neoplasm of unspecified site of unspecified female breast: Secondary | ICD-10-CM

## 2012-08-20 NOTE — Progress Notes (Signed)
Patient ID: Nancy Beard, female   DOB: 05/05/1956, 56 y.o.   MRN: [id number]  Chief Complaint  Patient presents with  . Follow-up    discuss bilateral mastectomy    HPI Nancy Beard is a 56 y.o. female.   HPI   She was seen in the [facility] because of newly diagnosed invasive ductal carcinoma of the right breast-grade 3, triple negative, proliferation rate is 34%. The lesion is in the 5:00 position of the right breast and was seen on a screening mammogram. It measured 1.1 cm in ultrasound a 1.9 cm on MRI. Image guided biopsy demonstrated the above pathology. Initially, we discussed right breast partial mastectomy with right axillary sentinel lymph node biopsy and insertion of Port-A-Cath.  She has done some research and decided she would like to have bilateral mastectomies and reconstruction and presents today to discuss that. She is here with her husband.   Past Medical History  Diagnosis Date  . Thyroid disease   . Depression   . Sprue   . Diverticulosis   . Hyperlipidemia   . Breast cancer     right    Past Surgical History  Procedure Date  . Esophagogastroduodenoscopy 2007    sprue  . Colonoscopy 2006  . Cryoblation of cervix   . Breast lumpectomy     Family History  Problem Relation Age of Onset  . Breast cancer Paternal Aunt     dx in her 60s  . Cancer Paternal Aunt     breast  . Lymphoma Paternal Uncle   . Breast cancer Paternal Grandmother     dx >50  . Cancer Paternal Grandmother     breast  . Heart attack Paternal Grandfather   . Breast cancer Other     2 maternal great aunts with breast cancer >50  . Cancer Other     breast    Social History History  Substance Use Topics  . Smoking status: Never Smoker   . Smokeless tobacco: Not on file  . Alcohol Use: No    No Known Allergies  Current Outpatient Prescriptions  Medication Sig Dispense Refill  . acetaminophen (TYLENOL) 325 MG tablet Take 650 mg by mouth as needed.      . SYNTHROID 25 MCG  tablet Take 1 tablet (25 mcg total) by mouth daily.  30 each  3  . dexamethasone (DECADRON) 4 MG tablet Take 2 tablets by mouth once a day on the day after chemotherapy and then take 2 tablets two times a day for 2 days. Take with food.  30 tablet  1  . lidocaine-prilocaine (EMLA) cream Apply topically as needed.  30 g  8  . liothyronine (CYTOMEL) 25 MCG tablet TAKE 1 TABLET DAILY  90 tablet  3  . LORazepam (ATIVAN) 0.5 MG tablet Take 1 tablet (0.5 mg total) by mouth every 6 (six) hours as needed (Nausea or vomiting).  30 tablet  0  . ondansetron (ZOFRAN) 8 MG tablet Take 1 tablet (8 mg total) by mouth 2 (two) times daily as needed. Take two times a day as needed for nausea or vomiting starting on the third day after chemotherapy.  30 tablet  1  . prochlorperazine (COMPAZINE) 10 MG tablet Take 1 tablet (10 mg total) by mouth every 6 (six) hours as needed (Nausea or vomiting).  30 tablet  1  . prochlorperazine (COMPAZINE) 25 MG suppository Place 1 suppository (25 mg total) rectally every 12 (twelve) hours as needed for nausea.    12 suppository  3    Review of Systems Review of Systems  Blood pressure 144/92, pulse 78, temperature 99 F (37.2 C), temperature source Temporal, resp. rate 14, height 5' 6" (1.676 m), weight 146 lb 6.4 oz (66.407 kg).  Physical Exam Physical Exam  Constitutional: She appears well-developed and well-nourished. No distress.  HENT:  Head: Normocephalic and atraumatic.  Pulmonary/Chest:       Breasts are symmetrical in size. No palpable masses in either breast.  No axillary adenopathy.    Data Reviewed Notes from her last clinic visit.  Assessment    Triple negative right-sided invasive breast cancer. She has decided she wants bilateral mastectomies rather than breast conservation treatment. She would like to see Dr. Sanger to talk about reconstruction.    Plan    Bilateral mastectomies and right axillary lymph node biopsy possible lymph node dissection. I  would like to wait on the Port-A-Cath placement until 3-4 weeks after the mastectomy procedure.  I have explained the procedure, risks, and aftercare to her.  Risks include but are not limited to bleeding, infection, wound problems, anesthesia, chronic chest wall pain, nerve injury, seroma formation, lymphedema.  She seems to understand.  She has a consultation with Dr. Sanger on February 10. Will schedule the surgery after she has decided what she wants to do with respect to reconstruction.       Finnigan Warriner J 08/20/2012, 12:11 PM    

## 2012-08-20 NOTE — Patient Instructions (Addendum)
We will be able to schedule the surgery after you see the plastic surgeon.  You have an appointment Monday, February 10, at 2 PM with Dr. Kelly Splinter.

## 2012-08-21 ENCOUNTER — Encounter: Payer: Self-pay | Admitting: Genetic Counselor

## 2012-08-25 ENCOUNTER — Other Ambulatory Visit (INDEPENDENT_AMBULATORY_CARE_PROVIDER_SITE_OTHER): Payer: Self-pay | Admitting: General Surgery

## 2012-08-25 ENCOUNTER — Other Ambulatory Visit: Payer: Private Health Insurance - Indemnity | Admitting: Lab

## 2012-08-25 ENCOUNTER — Ambulatory Visit: Payer: Private Health Insurance - Indemnity

## 2012-08-25 ENCOUNTER — Ambulatory Visit: Payer: Private Health Insurance - Indemnity | Admitting: Adult Health

## 2012-08-25 DIAGNOSIS — C50919 Malignant neoplasm of unspecified site of unspecified female breast: Secondary | ICD-10-CM

## 2012-08-26 ENCOUNTER — Ambulatory Visit: Payer: Private Health Insurance - Indemnity

## 2012-09-05 ENCOUNTER — Encounter (HOSPITAL_COMMUNITY): Payer: Self-pay | Admitting: Respiratory Therapy

## 2012-09-05 ENCOUNTER — Other Ambulatory Visit: Payer: Self-pay | Admitting: Plastic Surgery

## 2012-09-05 ENCOUNTER — Encounter: Payer: Self-pay | Admitting: *Deleted

## 2012-09-05 DIAGNOSIS — C50919 Malignant neoplasm of unspecified site of unspecified female breast: Secondary | ICD-10-CM

## 2012-09-05 NOTE — H&P (Signed)
This document contains confidential information from a [facility] medical record system and may be unauthenticated. Release may be made only with a valid authorization or in accordance with applicable policies of Medical Center or its affiliates. This document must be maintained in a secure manner or discarded/destroyed as required by Medical Center policy or by a confidential means such as shredding.   [name]   08/25/2012 2:00 PM Office Visit  MRN: [id number]  Department: [facility]  Dept Phone: [phone]  Description: Female DOB: 01-Jun-1956  Provider: Wayland Denis, DO    Diagnoses     Breast cancer   - Primary    174.9      Reason for Visit  -  Breast Reconstruction      All Charges for This Encounter       Code Description Service Date Service Provider Modifiers Quantity    [phone] PR OFFICE OUTPATIENT NEW 30 MINUTES 08/25/2012 Wayland Denis, DO   1      Dx: Malignant neoplasm of breast (female), unspecified site [174.9]      Vitals - Last Recorded      BP Pulse Temp Resp Ht Wt    136/82  87  98.1 F (36.7 C)  14  1.676 m (5\' 6" )  63.504 kg (140 lb)      BMI SpO2    22.60 kg/m2  98%           Subjective:    Patient ID: Nancy Beard is a 57 y.o. female.  HPI The patient is a 57 yrs old wf here with her husband for consultation for breast reconstruction.  She was seen in the Multidisciplinary breast clinic for a newly diagnosed invasive ductal carcinoma of the right breast-grade 3, triple negative breast cancer. The lesion is located at the 5 o'clock position of the right breast and was detected on a screening mammogram. It is ~1-2 cm. Image guided biopsy demonstrated the above pathology. The patient is interested in bilateral mastectomies. She has done some research and decided she would like to have bilateral mastectomies. She is 5 feet 6 inches tall, weighs 146 pounds and wears a 38 C bra.  The following portions of the patient's  history were reviewed and updated as appropriate: allergies, current medications, past family history, past medical history, past social history, past surgical history and problem list.  Review of Systems  Constitutional: Negative.   HENT: Negative.   Eyes: Negative.   Respiratory: Negative.   Cardiovascular: Negative.   Gastrointestinal: Negative.   Genitourinary: Negative.   Musculoskeletal: Negative.   Hematological: Negative.   Psychiatric/Behavioral: Negative.    Objective:    Physical Exam  Constitutional: She appears well-developed and well-nourished.  HENT:   Head: Normocephalic and atraumatic.  Eyes: EOM are normal. Pupils are equal, round, and reactive to light.  Cardiovascular: Normal rate.   Pulmonary/Chest: Effort normal.  Abdominal: Soft. She exhibits no distension. There is no tenderness.  Musculoskeletal: Normal range of motion.  Neurological: She is alert.  Skin: Skin is warm.  Psychiatric: She has a normal mood and affect. Her behavior is normal. Judgment and thought content normal.      Assessment:   1.  Breast cancer       Plan:     Assessment and Plan:   A long, detailed conversation was had regarding the patient's options for breast reconstruction. Five main points, which are explained to all breast reconstruction patients, were discussed.  1. Breast reconstruction is an optional process.   2. Breast reconstruction is a multi-stage process which involves multiple surgeries spaced several months apart. The entire process can take over one year.   3. The major goal of breast reconstruction is to have the patient look normal in clothing. When naked, there will always be scars.   4. Asymmetries are often present during the reconstruction process. Several operations may be needed, including surgery to the non-cancerous breast, to achieve satisfactory results.   5. No matter the reconstructive method, there are ways that the reconstruction can fail and a  secondary reconstructive plan would need to be created.    A general discussion regarding all available methods of breast reconstruction were discussed. The types of reconstructions described included.   1. Tissue expander and implant based reconstruction, both single and multi-stage approaches.   2. Autologous only reconstructions, including free abdominal-tissue based reconstructions.   3. Combination procedures, particularly latissismus dorsi flaps combined with either expanders or implants.   For each of the reconstruction methods mentioned above, the risks, benefits, alternatives, scarring, and recovery time were discussed in great detail. Specific risks detailed included bleeding, infection, hematoma, seroma, scarring, pain, wound healing complications, flap loss, fat necrosis, capsular contracture, need for implant removal, donor site complications, bulge, hernia, umbilical necrosis, need for urgent reoperation, and need for dressing changes were discussed.    Assessment   Once all reconstruction options were presented, a focused discussion was had regarding the patient's suitability for each of these procedures.   A total of 50 minutes of face-to-face time was spent in this encounter, of which >50% was spent in counseling. She is a good candidate for bilateral immediate breast reconstruction with expander and ADM.

## 2012-09-07 ENCOUNTER — Telehealth: Payer: Self-pay | Admitting: Oncology

## 2012-09-07 NOTE — Telephone Encounter (Signed)
S/w pt re appt for 3/10.

## 2012-09-08 ENCOUNTER — Ambulatory Visit: Payer: Private Health Insurance - Indemnity

## 2012-09-08 ENCOUNTER — Ambulatory Visit: Payer: Private Health Insurance - Indemnity | Admitting: Adult Health

## 2012-09-08 ENCOUNTER — Encounter (HOSPITAL_COMMUNITY): Payer: Self-pay

## 2012-09-08 ENCOUNTER — Encounter (HOSPITAL_COMMUNITY)
Admission: RE | Admit: 2012-09-08 | Discharge: 2012-09-08 | Disposition: A | Discharge status: Home / Self Care | Payer: Private Health Insurance - Indemnity | Source: Ambulatory Visit | Attending: General Surgery | Admitting: General Surgery

## 2012-09-08 ENCOUNTER — Other Ambulatory Visit: Payer: Private Health Insurance - Indemnity | Admitting: Lab

## 2012-09-08 VITALS — BP 136/78 | HR 90 | Temp 98.6°F | Resp 20 | Ht 66.0 in | Wt 146.9 lb

## 2012-09-08 DIAGNOSIS — C50919 Malignant neoplasm of unspecified site of unspecified female breast: Secondary | ICD-10-CM

## 2012-09-08 HISTORY — DX: Pneumonia, unspecified organism: J18.9

## 2012-09-08 HISTORY — DX: Hypothyroidism, unspecified: E03.9

## 2012-09-08 LAB — TYPE AND SCREEN
ABO/RH(D): O POS
Antibody Screen: NEGATIVE

## 2012-09-08 LAB — CBC WITH DIFFERENTIAL/PLATELET
Basophils Absolute: 0 10*3/uL (ref 0.0–0.1)
Basophils Relative: 1 % (ref 0–1)
Eosinophils Absolute: 0.2 10*3/uL (ref 0.0–0.7)
Eosinophils Relative: 3 % (ref 0–5)
HCT: 37 % (ref 36.0–46.0)
Hemoglobin: 12.5 g/dL (ref 12.0–15.0)
Lymphocytes Relative: 44 % (ref 12–46)
Lymphs Abs: 2.6 10*3/uL (ref 0.7–4.0)
MCH: 25.3 pg — ABNORMAL LOW (ref 26.0–34.0)
MCHC: 33.8 g/dL (ref 30.0–36.0)
MCV: 74.9 fL — ABNORMAL LOW (ref 78.0–100.0)
Monocytes Absolute: 0.7 10*3/uL (ref 0.1–1.0)
Monocytes Relative: 12 % (ref 3–12)
Neutro Abs: 2.5 10*3/uL (ref 1.7–7.7)
Neutrophils Relative %: 41 % — ABNORMAL LOW (ref 43–77)
Platelets: 285 10*3/uL (ref 150–400)
RBC: 4.94 MIL/uL (ref 3.87–5.11)
RDW: 17.5 % — ABNORMAL HIGH (ref 11.5–15.5)
WBC: 6.1 10*3/uL (ref 4.0–10.5)

## 2012-09-08 LAB — PROTIME-INR
INR: 0.92 (ref 0.00–1.49)
Prothrombin Time: 12.3 seconds (ref 11.6–15.2)

## 2012-09-08 LAB — COMPREHENSIVE METABOLIC PANEL
ALT: 42 U/L — ABNORMAL HIGH (ref 0–35)
AST: 41 U/L — ABNORMAL HIGH (ref 0–37)
Albumin: 4 g/dL (ref 3.5–5.2)
Alkaline Phosphatase: 129 U/L — ABNORMAL HIGH (ref 39–117)
BUN: 12 mg/dL (ref 6–23)
CO2: 28 mEq/L (ref 19–32)
Calcium: 9.6 mg/dL (ref 8.4–10.5)
Chloride: 104 mEq/L (ref 96–112)
Creatinine, Ser: 0.67 mg/dL (ref 0.50–1.10)
GFR calc Af Amer: >90 mL/min (ref >90)
GFR calc non Af Amer: >90 mL/min (ref >90)
Glucose, Bld: 98 mg/dL (ref 70–99)
Potassium: 4.4 mEq/L (ref 3.5–5.1)
Sodium: 141 mEq/L (ref 135–145)
Total Bilirubin: 0.3 mg/dL (ref 0.3–1.2)
Total Protein: 7.5 g/dL (ref 6.0–8.3)

## 2012-09-08 LAB — SURGICAL PCR SCREEN
MRSA, PCR: NEGATIVE
Staphylococcus aureus: NEGATIVE

## 2012-09-08 LAB — ABO/RH: ABO/RH(D): O POS

## 2012-09-08 NOTE — Pre-Procedure Instructions (Addendum)
Nancy Beard  09/08/2012   Your procedure is scheduled on:  Thursday, February 27th  Report to [facility] at 1100 AM.  Call this number if you have problems the morning of surgery: [phone]   Remember:   Do not eat food or drink liquids after midnight.    Take these medicines the morning of surgery with A SIP OF WATER:synthroid, ativan if needed   Do not wear jewelry, make-up or nail polish.  Do not wear lotions, powders, or perfumes,deodorant.  Do not shave 48 hours prior to surgery.   Do not bring valuables to the hospital.  Contacts, dentures or bridgework may not be worn into surgery.  Leave suitcase in the car. After surgery it may be brought to your room.  For patients admitted to the hospital, checkout time is 11:00 AM the day of discharge.   Patients discharged the day of surgery will not be allowed to drive home.    Special Instructions: Shower using CHG 2 nights before surgery and the night before surgery.  If you shower the day of surgery use CHG.  Use special wash - you have one bottle of CHG for all showers.  You should use approximately 1/3 of the bottle for each shower.   Please read over the following fact sheets that you were given: Pain Booklet, Coughing and Deep Breathing, Blood Transfusion Information, MRSA Information and Surgical Site Infection Prevention

## 2012-09-09 ENCOUNTER — Ambulatory Visit: Payer: Private Health Insurance - Indemnity

## 2012-09-10 MED ORDER — CEFAZOLIN SODIUM-DEXTROSE 2-3 GM-% IV SOLR: 2.0000 g | INTRAVENOUS | Status: DC

## 2012-09-11 ENCOUNTER — Encounter (HOSPITAL_COMMUNITY)
Admission: RE | Disposition: A | Discharge status: Home / Self Care | Payer: Self-pay | Source: Ambulatory Visit | Attending: General Surgery

## 2012-09-11 ENCOUNTER — Encounter (HOSPITAL_COMMUNITY): Payer: Self-pay | Admitting: Anesthesiology

## 2012-09-11 ENCOUNTER — Encounter (HOSPITAL_COMMUNITY): Payer: Self-pay | Admitting: *Deleted

## 2012-09-11 ENCOUNTER — Ambulatory Visit (HOSPITAL_COMMUNITY): Payer: Private Health Insurance - Indemnity | Admitting: Anesthesiology

## 2012-09-11 ENCOUNTER — Inpatient Hospital Stay (HOSPITAL_COMMUNITY)
Admission: RE | Admit: 2012-09-11 | Discharge: 2012-09-17 | DRG: 582 | Disposition: A | Discharge status: Home / Self Care | Payer: Private Health Insurance - Indemnity | Source: Ambulatory Visit | Attending: General Surgery | Admitting: General Surgery

## 2012-09-11 ENCOUNTER — Encounter (HOSPITAL_COMMUNITY)
Admission: RE | Admit: 2012-09-11 | Discharge: 2012-09-11 | Disposition: A | Discharge status: Home / Self Care | Payer: Private Health Insurance - Indemnity | Source: Ambulatory Visit | Attending: General Surgery | Admitting: General Surgery

## 2012-09-11 DIAGNOSIS — D0590 Unspecified type of carcinoma in situ of unspecified breast: Secondary | ICD-10-CM

## 2012-09-11 DIAGNOSIS — D62 Acute posthemorrhagic anemia: Secondary | ICD-10-CM | POA: Diagnosis not present

## 2012-09-11 DIAGNOSIS — F3289 Other specified depressive episodes: Secondary | ICD-10-CM | POA: Diagnosis present

## 2012-09-11 DIAGNOSIS — C50919 Malignant neoplasm of unspecified site of unspecified female breast: Secondary | ICD-10-CM

## 2012-09-11 DIAGNOSIS — Z4001 Encounter for prophylactic removal of breast: Secondary | ICD-10-CM

## 2012-09-11 DIAGNOSIS — E785 Hyperlipidemia, unspecified: Secondary | ICD-10-CM | POA: Diagnosis present

## 2012-09-11 DIAGNOSIS — E079 Disorder of thyroid, unspecified: Secondary | ICD-10-CM | POA: Diagnosis present

## 2012-09-11 DIAGNOSIS — F329 Major depressive disorder, single episode, unspecified: Secondary | ICD-10-CM | POA: Diagnosis present

## 2012-09-11 DIAGNOSIS — C50311 Malignant neoplasm of lower-inner quadrant of right female breast: Secondary | ICD-10-CM | POA: Diagnosis present

## 2012-09-11 DIAGNOSIS — R000 Tachycardia, unspecified: Secondary | ICD-10-CM | POA: Diagnosis present

## 2012-09-11 DIAGNOSIS — Z79899 Other long term (current) drug therapy: Secondary | ICD-10-CM

## 2012-09-11 DIAGNOSIS — Y838 Other surgical procedures as the cause of abnormal reaction of the patient, or of later complication, without mention of misadventure at the time of the procedure: Secondary | ICD-10-CM | POA: Diagnosis not present

## 2012-09-11 DIAGNOSIS — C50319 Malignant neoplasm of lower-inner quadrant of unspecified female breast: Principal | ICD-10-CM | POA: Diagnosis present

## 2012-09-11 DIAGNOSIS — Y9210 Unspecified residential institution as the place of occurrence of the external cause: Secondary | ICD-10-CM | POA: Diagnosis not present

## 2012-09-11 DIAGNOSIS — T888XXS Other specified complications of surgical and medical care, not elsewhere classified, sequela: Secondary | ICD-10-CM

## 2012-09-11 DIAGNOSIS — IMO0002 Reserved for concepts with insufficient information to code with codable children: Secondary | ICD-10-CM | POA: Diagnosis not present

## 2012-09-11 HISTORY — PX: AXILLARY SENTINEL NODE BIOPSY: SHX5738

## 2012-09-11 HISTORY — PX: BREAST RECONSTRUCTION WITH PLACEMENT OF TISSUE EXPANDER AND FLEX HD (ACELLULAR HYDRATED DERMIS): SHX6295

## 2012-09-11 HISTORY — PX: TOTAL MASTECTOMY: SHX6129

## 2012-09-11 SURGERY — MASTECTOMY, SIMPLE
Anesthesia: General | Site: Breast | Laterality: Right

## 2012-09-11 MED ORDER — SODIUM CHLORIDE 0.9 % IV SOLN
3.0000 g | Freq: Four times a day (QID) | INTRAVENOUS | Status: DC
  Administered 2012-09-11 – 2012-09-17 (×23): 3 g via INTRAVENOUS
  Filled 2012-09-11 (×25): qty 3

## 2012-09-11 MED ORDER — SODIUM CHLORIDE 0.9 % IR SOLN
Status: DC | PRN
  Administered 2012-09-11: 15:00:00

## 2012-09-11 MED ORDER — CEFAZOLIN SODIUM-DEXTROSE 2-3 GM-% IV SOLR
2.0000 g | INTRAVENOUS | Status: AC
  Administered 2012-09-11: 2 g via INTRAVENOUS
  Filled 2012-09-11 (×2): qty 50

## 2012-09-11 MED ORDER — NEOSTIGMINE METHYLSULFATE 1 MG/ML IJ SOLN
INTRAMUSCULAR | Status: DC | PRN
  Administered 2012-09-11: 2.5 mg via INTRAVENOUS

## 2012-09-11 MED ORDER — ONDANSETRON HCL 4 MG/2ML IJ SOLN
4.0000 mg | Freq: Four times a day (QID) | INTRAMUSCULAR | Status: DC | PRN
  Administered 2012-09-11 – 2012-09-14 (×3): 4 mg via INTRAVENOUS
  Filled 2012-09-11 (×3): qty 2

## 2012-09-11 MED ORDER — LEVOTHYROXINE SODIUM 25 MCG PO TABS
25.0000 ug | ORAL_TABLET | Freq: Every day | ORAL | Status: DC
  Administered 2012-09-12 – 2012-09-16 (×5): 25 ug via ORAL
  Filled 2012-09-11 (×8): qty 1

## 2012-09-11 MED ORDER — PHENYLEPHRINE HCL 10 MG/ML IJ SOLN
INTRAMUSCULAR | Status: DC | PRN
  Administered 2012-09-11 (×9): 80 ug via INTRAVENOUS

## 2012-09-11 MED ORDER — HYDROMORPHONE HCL PF 1 MG/ML IJ SOLN
INTRAMUSCULAR | Status: AC
  Filled 2012-09-11: qty 1

## 2012-09-11 MED ORDER — FENTANYL CITRATE 0.05 MG/ML IJ SOLN
INTRAMUSCULAR | Status: DC | PRN
  Administered 2012-09-11: 150 ug via INTRAVENOUS
  Administered 2012-09-11 (×2): 50 ug via INTRAVENOUS

## 2012-09-11 MED ORDER — HYDROCODONE-ACETAMINOPHEN 5-325 MG PO TABS
1.0000 | ORAL_TABLET | ORAL | Status: DC | PRN
  Administered 2012-09-12: 1 via ORAL
  Filled 2012-09-11: qty 2
  Filled 2012-09-11: qty 1

## 2012-09-11 MED ORDER — METHYLENE BLUE 1 % INJ SOLN
INTRAMUSCULAR | Status: AC
  Filled 2012-09-11: qty 10

## 2012-09-11 MED ORDER — TECHNETIUM TC 99M SULFUR COLLOID FILTERED
1.0000 | Freq: Once | INTRAVENOUS | Status: AC | PRN
  Administered 2012-09-11: 1 via INTRADERMAL

## 2012-09-11 MED ORDER — ALBUMIN HUMAN 5 % IV SOLN
INTRAVENOUS | Status: DC | PRN
  Administered 2012-09-11: 15:00:00 via INTRAVENOUS

## 2012-09-11 MED ORDER — PROPOFOL 10 MG/ML IV BOLUS
INTRAVENOUS | Status: DC | PRN
  Administered 2012-09-11: 110 mg via INTRAVENOUS

## 2012-09-11 MED ORDER — 0.9 % SODIUM CHLORIDE (POUR BTL) OPTIME
TOPICAL | Status: DC | PRN
  Administered 2012-09-11: 3000 mL

## 2012-09-11 MED ORDER — ONDANSETRON HCL 4 MG/2ML IJ SOLN: 4.0000 mg | Freq: Once | INTRAMUSCULAR | Status: DC | PRN

## 2012-09-11 MED ORDER — ROCURONIUM BROMIDE 100 MG/10ML IV SOLN
INTRAVENOUS | Status: DC | PRN
  Administered 2012-09-11: 50 mg via INTRAVENOUS

## 2012-09-11 MED ORDER — PANTOPRAZOLE SODIUM 40 MG IV SOLR
40.0000 mg | Freq: Every day | INTRAVENOUS | Status: DC
  Administered 2012-09-11 – 2012-09-16 (×7): 40 mg via INTRAVENOUS
  Filled 2012-09-11 (×10): qty 40

## 2012-09-11 MED ORDER — DOCUSATE SODIUM 100 MG PO CAPS
100.0000 mg | ORAL_CAPSULE | Freq: Three times a day (TID) | ORAL | Status: DC
  Administered 2012-09-11 – 2012-09-16 (×14): 100 mg via ORAL
  Filled 2012-09-11 (×15): qty 1

## 2012-09-11 MED ORDER — KCL IN DEXTROSE-NACL 20-5-0.45 MEQ/L-%-% IV SOLN
INTRAVENOUS | Status: DC
  Administered 2012-09-11 – 2012-09-16 (×11): via INTRAVENOUS
  Administered 2012-09-16: 125 mL via INTRAVENOUS
  Administered 2012-09-17: 06:00:00 via INTRAVENOUS
  Filled 2012-09-11 (×19): qty 1000

## 2012-09-11 MED ORDER — OXYCODONE HCL 5 MG/5ML PO SOLN: 5.0000 mg | Freq: Once | ORAL | Status: DC | PRN

## 2012-09-11 MED ORDER — DIAZEPAM 2 MG PO TABS: 2.0000 mg | ORAL_TABLET | Freq: Two times a day (BID) | ORAL | Status: DC | PRN

## 2012-09-11 MED ORDER — MEPERIDINE HCL 25 MG/ML IJ SOLN: 6.2500 mg | INTRAMUSCULAR | Status: DC | PRN

## 2012-09-11 MED ORDER — CEFAZOLIN SODIUM-DEXTROSE 2-3 GM-% IV SOLR: 2.0000 g | INTRAVENOUS | Status: DC

## 2012-09-11 MED ORDER — MIDAZOLAM HCL 5 MG/5ML IJ SOLN
INTRAMUSCULAR | Status: DC | PRN
  Administered 2012-09-11: 2 mg via INTRAVENOUS

## 2012-09-11 MED ORDER — LIOTHYRONINE SODIUM 25 MCG PO TABS
12.5000 ug | ORAL_TABLET | Freq: Every day | ORAL | Status: DC
  Administered 2012-09-12 – 2012-09-16 (×5): 12.5 ug via ORAL
  Filled 2012-09-11 (×6): qty 1

## 2012-09-11 MED ORDER — ACETAMINOPHEN 325 MG PO TABS
650.0000 mg | ORAL_TABLET | Freq: Four times a day (QID) | ORAL | Status: DC | PRN
  Administered 2012-09-13 – 2012-09-14 (×4): 650 mg via ORAL
  Filled 2012-09-11 (×4): qty 2

## 2012-09-11 MED ORDER — LIDOCAINE HCL (CARDIAC) 20 MG/ML IV SOLN
INTRAVENOUS | Status: DC | PRN
  Administered 2012-09-11: 70 mg via INTRAVENOUS

## 2012-09-11 MED ORDER — LIDOCAINE HCL 4 % MT SOLN
OROMUCOSAL | Status: DC | PRN
  Administered 2012-09-11: 4 mL via TOPICAL

## 2012-09-11 MED ORDER — LACTATED RINGERS IV SOLN
INTRAVENOUS | Status: DC | PRN
  Administered 2012-09-11 (×2): via INTRAVENOUS

## 2012-09-11 MED ORDER — ACETAMINOPHEN 650 MG RE SUPP: 650.0000 mg | Freq: Four times a day (QID) | RECTAL | Status: DC | PRN

## 2012-09-11 MED ORDER — GLYCOPYRROLATE 0.2 MG/ML IJ SOLN
INTRAMUSCULAR | Status: DC | PRN
  Administered 2012-09-11: .5 mg via INTRAVENOUS

## 2012-09-11 MED ORDER — OXYCODONE HCL 5 MG PO TABS: 5.0000 mg | ORAL_TABLET | Freq: Once | ORAL | Status: DC | PRN

## 2012-09-11 MED ORDER — MORPHINE SULFATE 2 MG/ML IJ SOLN
2.0000 mg | INTRAMUSCULAR | Status: DC | PRN
Start: 2012-09-11 — End: 2012-09-17
  Administered 2012-09-11 – 2012-09-15 (×8): 2 mg via INTRAVENOUS
  Filled 2012-09-11 (×8): qty 1

## 2012-09-11 MED ORDER — ONDANSETRON HCL 4 MG/2ML IJ SOLN
INTRAMUSCULAR | Status: DC | PRN
  Administered 2012-09-11: 4 mg via INTRAVENOUS

## 2012-09-11 MED ORDER — HYDROMORPHONE HCL PF 1 MG/ML IJ SOLN
0.2500 mg | INTRAMUSCULAR | Status: DC | PRN
  Administered 2012-09-11 (×5): 0.5 mg via INTRAVENOUS

## 2012-09-11 MED ORDER — BUPIVACAINE HCL (PF) 0.25 % IJ SOLN
INTRAMUSCULAR | Status: AC
  Filled 2012-09-11: qty 30

## 2012-09-11 SURGICAL SUPPLY — 78 items
ADH SKN CLS APL DERMABOND .7 (GAUZE/BANDAGES/DRESSINGS) ×6
APL SKNCLS STERI-STRIP NONHPOA (GAUZE/BANDAGES/DRESSINGS) ×3
BAG DECANTER FOR FLEXI CONT (MISCELLANEOUS) ×4 IMPLANT
BENZOIN TINCTURE PRP APPL 2/3 (GAUZE/BANDAGES/DRESSINGS) ×4 IMPLANT
BINDER BREAST LRG (GAUZE/BANDAGES/DRESSINGS) ×1 IMPLANT
BINDER BREAST XLRG (GAUZE/BANDAGES/DRESSINGS) IMPLANT
BIOPATCH RED 1 DISK 7.0 (GAUZE/BANDAGES/DRESSINGS) ×4 IMPLANT
BLADE SURG 15 STRL LF DISP TIS (BLADE) ×3 IMPLANT
BLADE SURG 15 STRL SS (BLADE) ×4
CANISTER SUCTION 2500CC (MISCELLANEOUS) ×4 IMPLANT
CHLORAPREP W/TINT 26ML (MISCELLANEOUS) ×8 IMPLANT
CLOTH BEACON ORANGE TIMEOUT ST (SAFETY) ×8 IMPLANT
CONT SPEC 4OZ CLIKSEAL STRL BL (MISCELLANEOUS) ×4 IMPLANT
COVER PROBE W GEL 5X96 (DRAPES) ×4 IMPLANT
COVER SURGICAL LIGHT HANDLE (MISCELLANEOUS) ×8 IMPLANT
DECANTER SPIKE VIAL GLASS SM (MISCELLANEOUS) ×4 IMPLANT
DERMABOND ADVANCED (GAUZE/BANDAGES/DRESSINGS) ×2
DERMABOND ADVANCED .7 DNX12 (GAUZE/BANDAGES/DRESSINGS) ×6 IMPLANT
DRAIN CHANNEL 19F RND (DRAIN) ×8 IMPLANT
DRAPE CHEST BREAST 15X10 FENES (DRAPES) ×4 IMPLANT
DRAPE ORTHO SPLIT 77X108 STRL (DRAPES) ×8
DRAPE PROXIMA HALF (DRAPES) ×8 IMPLANT
DRAPE SURG 17X23 STRL (DRAPES) ×16 IMPLANT
DRAPE SURG ORHT 6 SPLT 77X108 (DRAPES) ×6 IMPLANT
DRAPE UTILITY 15X26 W/TAPE STR (DRAPE) ×8 IMPLANT
DRAPE WARM FLUID 44X44 (DRAPE) ×4 IMPLANT
DRSG OPSITE 4X5.5 SM (GAUZE/BANDAGES/DRESSINGS) ×4 IMPLANT
DRSG PAD ABDOMINAL 8X10 ST (GAUZE/BANDAGES/DRESSINGS) ×7 IMPLANT
DRSG TEGADERM 4X4.75 (GAUZE/BANDAGES/DRESSINGS) ×1 IMPLANT
ELECT BLADE 4.0 EZ CLEAN MEGAD (MISCELLANEOUS) ×8
ELECT CAUTERY BLADE 6.4 (BLADE) ×4 IMPLANT
ELECT REM PT RETURN 9FT ADLT (ELECTROSURGICAL) ×8
ELECTRODE BLDE 4.0 EZ CLN MEGD (MISCELLANEOUS) ×3 IMPLANT
ELECTRODE REM PT RTRN 9FT ADLT (ELECTROSURGICAL) ×6 IMPLANT
EVACUATOR SILICONE 100CC (DRAIN) ×8 IMPLANT
GLOVE BIO SURGEON STRL SZ 6.5 (GLOVE) ×4 IMPLANT
GLOVE BIOGEL PI IND STRL 8 (GLOVE) ×3 IMPLANT
GLOVE BIOGEL PI INDICATOR 8 (GLOVE) ×1
GLOVE ECLIPSE 8.0 STRL XLNG CF (GLOVE) ×4 IMPLANT
GOWN STRL NON-REIN LRG LVL3 (GOWN DISPOSABLE) ×20 IMPLANT
GRAFT FLEX HD 4X16 THICK (Tissue Mesh) ×2 IMPLANT
GRAFT FLEX HD BILAT 4X16 THICK (Tissue Mesh) ×1 IMPLANT
KIT BASIN OR (CUSTOM PROCEDURE TRAY) ×8 IMPLANT
KIT ROOM TURNOVER OR (KITS) ×8 IMPLANT
MARKER SKIN DUAL TIP RULER LAB (MISCELLANEOUS) ×1 IMPLANT
NDL 18GX1X1/2 (RX/OR ONLY) (NEEDLE) IMPLANT
NDL HYPO 25GX1X1/2 BEV (NEEDLE) ×3 IMPLANT
NEEDLE 18GX1X1/2 (RX/OR ONLY) (NEEDLE) IMPLANT
NEEDLE HYPO 25GX1X1/2 BEV (NEEDLE) ×4 IMPLANT
NS IRRIG 1000ML POUR BTL (IV SOLUTION) ×12 IMPLANT
PACK GENERAL/GYN (CUSTOM PROCEDURE TRAY) ×8 IMPLANT
PACK SURGICAL SETUP 50X90 (CUSTOM PROCEDURE TRAY) ×4 IMPLANT
PAD ARMBOARD 7.5X6 YLW CONV (MISCELLANEOUS) ×8 IMPLANT
PENCIL BUTTON HOLSTER BLD 10FT (ELECTRODE) ×4 IMPLANT
PIN SAFETY STERILE (MISCELLANEOUS) ×5 IMPLANT
SET ASEPTIC TRANSFER (MISCELLANEOUS) IMPLANT
SPONGE GAUZE 4X4 12PLY (GAUZE/BANDAGES/DRESSINGS) ×8 IMPLANT
SPONGE LAP 18X18 X RAY DECT (DISPOSABLE) ×1 IMPLANT
SPONGE LAP 4X18 X RAY DECT (DISPOSABLE) ×4 IMPLANT
STAPLER VISISTAT 35W (STAPLE) ×1 IMPLANT
STRIP CLOSURE SKIN 1/2X4 (GAUZE/BANDAGES/DRESSINGS) ×4 IMPLANT
SUT ETHILON 2 0 FS 18 (SUTURE) ×4 IMPLANT
SUT MNCRL AB 4-0 PS2 18 (SUTURE) ×4 IMPLANT
SUT MON AB 4-0 PC3 18 (SUTURE) ×4 IMPLANT
SUT MON AB 5-0 PS2 18 (SUTURE) ×10 IMPLANT
SUT PDS AB 2-0 CT1 27 (SUTURE) ×20 IMPLANT
SUT SILK 3 0 SH 30 (SUTURE) ×6 IMPLANT
SUT VIC AB 3-0 SH 18 (SUTURE) ×6 IMPLANT
SUT VIC AB 3-0 SH 27 (SUTURE) ×12
SUT VIC AB 3-0 SH 27X BRD (SUTURE) ×9 IMPLANT
SUT VICRYL 4-0 PS2 18IN ABS (SUTURE) ×10 IMPLANT
SYR CONTROL 10ML LL (SYRINGE) ×4 IMPLANT
TISSUE EXPANDER 350CC (Miscellaneous) ×2 IMPLANT
TOWEL OR 17X24 6PK STRL BLUE (TOWEL DISPOSABLE) ×8 IMPLANT
TOWEL OR 17X26 10 PK STRL BLUE (TOWEL DISPOSABLE) ×8 IMPLANT
TRAY FOLEY CATH 14FRSI W/METER (CATHETERS) IMPLANT
TUBE CONNECTING 12X1/4 (SUCTIONS) ×4 IMPLANT
YANKAUER SUCT BULB TIP NO VENT (SUCTIONS) ×4 IMPLANT

## 2012-09-11 NOTE — Transfer of Care (Signed)
Immediate Anesthesia Transfer of Care Note  Patient: Nancy Beard  Procedure(s) Performed: Procedure(s) with comments: bilateral MASTECTOMY (Bilateral) AXILLARY SENTINEL lymph NODE  BIOPSY (Right) - right nuclear medicine injection 12:30  BREAST RECONSTRUCTION WITH PLACEMENT OF TISSUE EXPANDER AND FLEX HD (ACELLULAR HYDRATED DERMIS) ADM (Bilateral)  Patient Location: PACU  Anesthesia Type:General  Level of Consciousness: awake, alert  and oriented  Airway & Oxygen Therapy: Patient Spontanous Breathing and Patient connected to nasal cannula oxygen  Post-op Assessment: Report given to PACU RN, Post -op Vital signs reviewed and stable and Patient moving all extremities  Post vital signs: Reviewed and stable  Complications: No apparent anesthesia complications

## 2012-09-11 NOTE — Interval H&P Note (Signed)
History and Physical Interval Note:  09/11/2012 1:02 PM  Nancy Beard  has presented today for surgery, with the diagnosis of RIGHT BREAST CANCER   The various methods of treatment have been discussed with the patient and family. After consideration of risks, benefits and other options for treatment, the patient has consented to  Procedure(s) with comments: bilateral MASTECTOMY (Bilateral) AXILLARY SENTINEL lymph NODE  BIOPSY (Right) - right nuclear medicine injection 12:30  BREAST RECONSTRUCTION WITH PLACEMENT OF TISSUE EXPANDER AND FLEX HD (ACELLULAR HYDRATED DERMIS) ADM (Bilateral) as a surgical intervention .  The patient's history has been reviewed, patient examined, no change in status, stable for surgery.  I have reviewed the patient's chart and labs.  Questions were answered to the patient's satisfaction.     [name] Shela Commons

## 2012-09-11 NOTE — H&P (View-Only) (Signed)
Patient ID: Nancy Beard, female   DOB: 1956/03/17, 57 y.o.   MRN: [id number]  Chief Complaint  Patient presents with  . Follow-up    discuss bilateral mastectomy    HPI Nancy Beard is a 57 y.o. female.   HPI   She was seen in the [facility] because of newly diagnosed invasive ductal carcinoma of the right breast-grade 3, triple negative, proliferation rate is 34%. The lesion is in the 5:00 position of the right breast and was seen on a screening mammogram. It measured 1.1 cm in ultrasound a 1.9 cm on MRI. Image guided biopsy demonstrated the above pathology. Initially, we discussed right breast partial mastectomy with right axillary sentinel lymph node biopsy and insertion of Port-A-Cath.  She has done some research and decided she would like to have bilateral mastectomies and reconstruction and presents today to discuss that. She is here with her husband.   Past Medical History  Diagnosis Date  . Thyroid disease   . Depression   . Sprue   . Diverticulosis   . Hyperlipidemia   . Breast cancer     right    Past Surgical History  Procedure Date  . Esophagogastroduodenoscopy 2007    sprue  . Colonoscopy 2006  . Cryoblation of cervix   . Breast lumpectomy     Family History  Problem Relation Age of Onset  . Breast cancer Paternal Aunt     dx in her 28s  . Cancer Paternal Aunt     breast  . Lymphoma Paternal Uncle   . Breast cancer Paternal Grandmother     dx >50  . Cancer Paternal Grandmother     breast  . Heart attack Paternal Grandfather   . Breast cancer Other     2 maternal great aunts with breast cancer >50  . Cancer Other     breast    Social History History  Substance Use Topics  . Smoking status: Never Smoker   . Smokeless tobacco: Not on file  . Alcohol Use: No    No Known Allergies  Current Outpatient Prescriptions  Medication Sig Dispense Refill  . acetaminophen (TYLENOL) 325 MG tablet Take 650 mg by mouth as needed.      [name] SYNTHROID 25 MCG  tablet Take 1 tablet (25 mcg total) by mouth daily.  30 each  3  . dexamethasone (DECADRON) 4 MG tablet Take 2 tablets by mouth once a day on the day after chemotherapy and then take 2 tablets two times a day for 2 days. Take with food.  30 tablet  1  . lidocaine-prilocaine (EMLA) cream Apply topically as needed.  30 g  8  . liothyronine (CYTOMEL) 25 MCG tablet TAKE 1 TABLET DAILY  90 tablet  3  . LORazepam (ATIVAN) 0.5 MG tablet Take 1 tablet (0.5 mg total) by mouth every 6 (six) hours as needed (Nausea or vomiting).  30 tablet  0  . ondansetron (ZOFRAN) 8 MG tablet Take 1 tablet (8 mg total) by mouth 2 (two) times daily as needed. Take two times a day as needed for nausea or vomiting starting on the third day after chemotherapy.  30 tablet  1  . prochlorperazine (COMPAZINE) 10 MG tablet Take 1 tablet (10 mg total) by mouth every 6 (six) hours as needed (Nausea or vomiting).  30 tablet  1  . prochlorperazine (COMPAZINE) 25 MG suppository Place 1 suppository (25 mg total) rectally every 12 (twelve) hours as needed for nausea.  12 suppository  3    Review of Systems Review of Systems  Blood pressure 144/92, pulse 78, temperature 99 F (37.2 C), temperature source Temporal, resp. rate 14, height 5\' 6"  (1.676 m), weight 146 lb 6.4 oz (66.407 kg).  Physical Exam Physical Exam  Constitutional: She appears well-developed and well-nourished. No distress.  HENT:  Head: Normocephalic and atraumatic.  Pulmonary/Chest:       Breasts are symmetrical in size. No palpable masses in either breast.  No axillary adenopathy.    Data Reviewed Notes from her last clinic visit.  Assessment    Triple negative right-sided invasive breast cancer. She has decided she wants bilateral mastectomies rather than breast conservation treatment. She would like to see Dr. Kelly Splinter to talk about reconstruction.    Plan    Bilateral mastectomies and right axillary lymph node biopsy possible lymph node dissection. I  would like to wait on the Port-A-Cath placement until 3-4 weeks after the mastectomy procedure.  I have explained the procedure, risks, and aftercare to her.  Risks include but are not limited to bleeding, infection, wound problems, anesthesia, chronic chest wall pain, nerve injury, seroma formation, lymphedema.  She seems to understand.  She has a consultation with Dr. Kelly Splinter on February 10. Will schedule the surgery after she has decided what she wants to do with respect to reconstruction.       Lysandra Loughmiller J 08/20/2012, 12:11 PM

## 2012-09-11 NOTE — Progress Notes (Signed)
Patient transferred from PACU to 6N16. Alert and arousable.  Dressing dry and intact, JP drains in place.  Vital signs stable. Oriented to room and call light, family at bedside. Will continue to monitor.

## 2012-09-11 NOTE — Anesthesia Postprocedure Evaluation (Signed)
Anesthesia Post Note  Patient: Nancy Beard  Procedure(s) Performed: Procedure(s) (LRB): bilateral MASTECTOMY (Bilateral) AXILLARY SENTINEL lymph NODE  BIOPSY (Right) BREAST RECONSTRUCTION WITH PLACEMENT OF TISSUE EXPANDER AND FLEX HD (ACELLULAR HYDRATED DERMIS) ADM (Bilateral)  Anesthesia type: general  Patient location: PACU  Post pain: Pain level controlled  Post assessment: Patient's Cardiovascular Status Stable  Last Vitals:  Filed Vitals:   09/11/12 1700  BP: 143/73  Pulse: 69  Temp: 36.9 C  Resp: 14    Post vital signs: Reviewed and stable  Level of consciousness: sedated  Complications: No apparent anesthesia complications

## 2012-09-11 NOTE — Op Note (Addendum)
Op report Bilateral Exchange   SURGICAL DIVISION: Plastic Surgery  PREOPERATIVE DIAGNOSES:  1. Right breast cancer.   POSTOPERATIVE DIAGNOSES:  1. Right breast cancer.   PROCEDURE:  1. Bilateral placement of breast tissue expanders and Flex HD (4 x 16 cm)  SURGEON: [employer], DO  ASSISTANT: Shawn Rayburn, PA  ANESTHESIA:  General.   COMPLICATIONS: None.   IMPLANTS: Left - Mentor Medium height Tissue expanders 100 cc of saline placed in a 350 cc expander Right - Mentor Medium height Tissue expanders 100 cc of saline placed in a 350 cc expander  INDICATIONS FOR PROCEDURE:  The patient was diagnosed with breast cancer and decided on bilateral mastectomies.   CONSENT:  Informed consent was obtained directly from the patient. Risks, benefits and alternatives were fully discussed. Specific risks including but not limited to bleeding, infection, hematoma, seroma, scarring, pain, implant infection, implant extrusion, capsular contracture, asymmetry, wound healing problems, and need for further surgery were all discussed. The patient did have an ample opportunity to have her questions answered to her satisfaction.   DESCRIPTION OF PROCEDURE:  The patient was taken to the operating room. SCDs were placed and IV antibiotics were given. The patient's chest was prepped and draped in a sterile fashion. A time out was performed and all information was confirmed to be correct.  Once the general surgery completed their portion of the case which consisted of bilateral mastectomies and right SLN biopsy, the patient was rendered to the plastic surgery team.   The Pectoralis major muscle was lifted from the chest wall with the electrocautery.  The mentor medium height tissue expander was selected.  The air was evacuated and the expander was prepared according to the manufacture guidelines.  The Expander was placed under the pectoralis muscle and sutured with the tabs and 2-0 PDS to the chest wall.   The Flex HD was prepared according to the manufacture guidelines and holes were placed to help with postoperative drainage.  The porous side was placed against the flap.  The Flex was sutured with 2-0 PDS to the lateral inferior edge of the pectoralis major muscle and then  to the inframammary fold.  Hemostasis was ensured with the electrocautery.  The Expander was inflated with 100 cc of injectable saline in each breast.  The #15 blade was used to make a stab incision and then a drain was placed on each side and secured with a 3-0 silk suture.  The deep layers were closed with a 3-0 vicryl followed by a 4-0 vicryl.  The skin edges were reapproximated with a 5-0 monocryl. Dermabond was applied.  Sterile dressings were applied.  A breast binder and ABDs were placed. The patient tolerated the procedure well. She was allowed to wake up and taken to the recovery room in stable condition. There were no complications.  I was present for the entire surgical case.

## 2012-09-11 NOTE — Brief Op Note (Signed)
09/11/2012  3:41 PM  PATIENT:  Nancy Beard  57 y.o. female  PRE-OPERATIVE DIAGNOSIS:  RIGHT BREAST CANCER   POST-OPERATIVE DIAGNOSIS:  RIGHT BREAST CANCER   PROCEDURE:  Procedure(s) with comments: bilateral MASTECTOMY (Bilateral) AXILLARY SENTINEL lymph NODE  BIOPSY (Right) - right nuclear medicine injection 12:30  BREAST RECONSTRUCTION WITH PLACEMENT OF TISSUE EXPANDER AND FLEX HD (ACELLULAR HYDRATED DERMIS) ADM (Bilateral)  SURGEON:  Surgeon(s) and Role: Panel 1:    * Adolph Pollack, MD - Primary    * Claire Sanger, DO  Panel 2:    * Claire Sanger, DO - Primary  PHYSICIAN ASSISTANT: Shawn Rayburn, PA  ASSISTANTS: none   ANESTHESIA:   general  EBL:  Total I/O In: 1750 [I.V.:1500; IV Piggyback:250] Out: 375 [Urine:275; Blood:100]  BLOOD ADMINISTERED:none  DRAINS: (2) Jackson-Pratt drain(s) with closed bulb suction in the one in each breast pocket   LOCAL MEDICATIONS USED:  NONE  SPECIMEN:  No Specimen  DISPOSITION OF SPECIMEN:  N/A  COUNTS:  YES  TOURNIQUET:  * No tourniquets in log *  DICTATION: .Dragon Dictation  PLAN OF CARE: Discharge to home after PACU  PATIENT DISPOSITION:  PACU - hemodynamically stable.   Delay start of Pharmacological VTE agent (>24hrs) due to surgical blood loss or risk of bleeding: no

## 2012-09-11 NOTE — OR Nursing (Signed)
Dr Abbey Chatters completed bilateral mastectomies and right axillary sentinal lymp node biopsy at 1501

## 2012-09-11 NOTE — H&P (View-Only) (Signed)
 This document contains confidential information from a [facility] medical record system and may be unauthenticated. Release may be made only with a valid authorization or in accordance with applicable policies of Medical Center or its affiliates. This document must be maintained in a secure manner or discarded/destroyed as required by Medical Center policy or by a confidential means such as shredding.   Nancy Beard   08/25/2012 2:00 PM Office Visit  MRN: [id number]  Department: [facility]  Dept Phone: [phone]  Description: Female DOB: 06/22/1956  Provider: Sadler Teschner Sanger, DO    Diagnoses     Breast cancer   - Primary    174.9      Reason for Visit  -  Breast Reconstruction      All Charges for This Encounter       Code Description Service Date Service Provider Modifiers Quantity    99203 PR OFFICE OUTPATIENT NEW 30 MINUTES 08/25/2012 Annsley Akkerman Sanger, DO   1      Dx: Malignant neoplasm of breast (female), unspecified site [174.9]      Vitals - Last Recorded      BP Pulse Temp Resp Ht Wt    136/82  87  98.1 F (36.7 C)  14  1.676 m (5' 6")  63.504 kg (140 lb)      BMI SpO2    22.60 kg/m2  98%           Subjective:    Patient ID: Nancy Beard is a 57 y.o. female.  HPI The patient is a 57 yrs old wf here with her husband for consultation for breast reconstruction.  She was seen in the Multidisciplinary breast clinic for a newly diagnosed invasive ductal carcinoma of the right breast-grade 3, triple negative breast cancer. The lesion is located at the 5 o'clock position of the right breast and was detected on a screening mammogram. It is ~1-2 cm. Image guided biopsy demonstrated the above pathology. The patient is interested in bilateral mastectomies. She has done some research and decided she would like to have bilateral mastectomies. She is 5 feet 6 inches tall, weighs 146 pounds and wears a 38 C bra.  The following portions of the patient's  history were reviewed and updated as appropriate: allergies, current medications, past family history, past medical history, past social history, past surgical history and problem list.  Review of Systems  Constitutional: Negative.   HENT: Negative.   Eyes: Negative.   Respiratory: Negative.   Cardiovascular: Negative.   Gastrointestinal: Negative.   Genitourinary: Negative.   Musculoskeletal: Negative.   Hematological: Negative.   Psychiatric/Behavioral: Negative.    Objective:    Physical Exam  Constitutional: She appears well-developed and well-nourished.  HENT:   Head: Normocephalic and atraumatic.  Eyes: EOM are normal. Pupils are equal, round, and reactive to light.  Cardiovascular: Normal rate.   Pulmonary/Chest: Effort normal.  Abdominal: Soft. She exhibits no distension. There is no tenderness.  Musculoskeletal: Normal range of motion.  Neurological: She is alert.  Skin: Skin is warm.  Psychiatric: She has a normal mood and affect. Her behavior is normal. Judgment and thought content normal.      Assessment:   1.  Breast cancer       Plan:     Assessment and Plan:   A long, detailed conversation was had regarding the patient's options for breast reconstruction. Five main points, which are explained to all breast reconstruction patients, were discussed.     1. Breast reconstruction is an optional process.   2. Breast reconstruction is a multi-stage process which involves multiple surgeries spaced several months apart. The entire process can take over one year.   3. The major goal of breast reconstruction is to have the patient look normal in clothing. When naked, there will always be scars.   4. Asymmetries are often present during the reconstruction process. Several operations may be needed, including surgery to the non-cancerous breast, to achieve satisfactory results.   5. No matter the reconstructive method, there are ways that the reconstruction can fail and a  secondary reconstructive plan would need to be created.    A general discussion regarding all available methods of breast reconstruction were discussed. The types of reconstructions described included.   1. Tissue expander and implant based reconstruction, both single and multi-stage approaches.   2. Autologous only reconstructions, including free abdominal-tissue based reconstructions.   3. Combination procedures, particularly latissismus dorsi flaps combined with either expanders or implants.   For each of the reconstruction methods mentioned above, the risks, benefits, alternatives, scarring, and recovery time were discussed in great detail. Specific risks detailed included bleeding, infection, hematoma, seroma, scarring, pain, wound healing complications, flap loss, fat necrosis, capsular contracture, need for implant removal, donor site complications, bulge, hernia, umbilical necrosis, need for urgent reoperation, and need for dressing changes were discussed.    Assessment   Once all reconstruction options were presented, a focused discussion was had regarding the patient's suitability for each of these procedures.   A total of 50 minutes of face-to-face time was spent in this encounter, of which >50% was spent in counseling. She is a good candidate for bilateral immediate breast reconstruction with expander and ADM.        

## 2012-09-11 NOTE — Preoperative (Signed)
Beta Blockers   Reason not to administer Beta Blockers:Not Applicable 

## 2012-09-11 NOTE — Op Note (Signed)
Operative Note  Nancy Beard female 57 y.o. 09/11/2012  PREOPERATIVE DX:  Invasive right breast cancer  POSTOPERATIVE DX:  Same  PROCEDURE:  1. Right axillary lymphatic mapping. 2. Bilateral mastectomies. 3. Right axillary sentinel lymph node biopsy.         Surgeon: Adolph Pollack   Assistants: Dr. Wayland Denis  Anesthesia: General endotracheal anesthesia  Indications:    This is a 57 year old female recently diagnosed with invasive right breast cancer. After thinking about all her options she elected to undergo bilateral mastectomies and right axillary sentinel lymph node biopsy followed by immediate placement of tissue expanders by Dr. Kelly Splinter. She now presents for that.    Procedure Detail:  She was seen in the holding area. She had injection of radioactive material into the right breast. She was then brought to the operating room placed supine on the operating table a general anesthetic was administered.The chest, axillary areas, and upper arms were sterilely prepped and draped.  An elliptical incision was made in the left breast through the skin and dermis. Subcutaneous flaps were then raised to the clavicle superiorly, the lateral border of the sternum medially, the latissimus muscle laterally, and the anterior rectus sheath inferiorly. The breast tissue was then dissected free from the pectoralis muscle and fascia using electrocautery. The medial aspect of the breast was marked. It was then handed off the field. The wound is inspected and bleeding was controlled electrocautery.  Next the right axilla was approached. A transverse incision was made in the anterior right axilla and carried to the down to the subcutaneous tissues into the axillary content area was identified. Using the neoprobe and lymph node was identified with increased counts and this was removed using electrocautery. It measured 1500. This was sentinel lymph node #1. Placement of the neoprobe back into the  axillary area did not demonstrate any other areas of significantly increased counts. The sentinel lymph node was sent for touch prep which was negative. The wound was inspected and hemostasis was adequate. The subcutaneous tissue was closed with interrupted 3-0 Vicryl sutures. The skin was closed with 4-0 Monocryl subcuticular stitch.  The right breast was then approached and an elliptical incision was made through the skin and dermis sharply. Subcutaneous flaps were raised to the clavicle superiorly, the lateral border of the sternum medially, the latissimus muscle laterally, and the anterior rectus sheath inferiorly. The breast tissue was then dissected free from the pectoralis muscle and fascia using electrocautery. The medial aspect of the breast was marked with a suture. It was handed off the field.  Was inspected and hemostasis was adequate.  Dr. Kelly Splinter then proceeded with placement of bilateral tissue expanders and closure of the chest wall wounds. She tolerated the initial part of the procedure well.  Estimated Blood Loss:  200 mL         Drains: Per Dr. Kelly Splinter.  Blood Given: none          Specimens: Right axillary sentinel lymph node. Bilateral breasts.        Complications:  * No complications entered in OR log *         Disposition: PACU - hemodynamically stable.         Condition: stable

## 2012-09-11 NOTE — Anesthesia Procedure Notes (Signed)
Procedure Name: Intubation Date/Time: 09/11/2012 1:19 PM Performed by: Quentin Ore Pre-anesthesia Checklist: Patient identified, Emergency Drugs available, Suction available, Patient being monitored and Timeout performed Patient Re-evaluated:Patient Re-evaluated prior to inductionOxygen Delivery Method: Circle system utilized Preoxygenation: Pre-oxygenation with 100% oxygen Intubation Type: IV induction Ventilation: Mask ventilation without difficulty Laryngoscope Size: Mac and 3 Grade View: Grade I Tube type: Oral Tube size: 7.0 mm Number of attempts: 1 Airway Equipment and Method: Stylet and LTA kit utilized Placement Confirmation: ETT inserted through vocal cords under direct vision,  positive ETCO2 and breath sounds checked- equal and bilateral Secured at: 21 cm Tube secured with: Tape Dental Injury: Teeth and Oropharynx as per pre-operative assessment

## 2012-09-11 NOTE — Interval H&P Note (Signed)
History and Physical Interval Note:  09/11/2012 12:20 PM  Nancy Beard  has presented today for surgery, with the diagnosis of RIGHT BREAST CANCER   The various methods of treatment have been discussed with the patient and family. After consideration of risks, benefits and other options for treatment, the patient has consented to  Procedure(s) with comments: bilateral MASTECTOMY (Bilateral) AXILLARY SENTINEL lymph NODE  BIOPSY (Right) - right nuclear medicine injection 12:30  BREAST RECONSTRUCTION WITH PLACEMENT OF TISSUE EXPANDER AND FLEX HD (ACELLULAR HYDRATED DERMIS) ADM (Bilateral) as a surgical intervention .  The patient's history has been reviewed, patient examined, no change in status, stable for surgery.  I have reviewed the patient's chart and labs.  Questions were answered to the patient's satisfaction.     SANGER,Adamaris King

## 2012-09-11 NOTE — Anesthesia Preprocedure Evaluation (Addendum)
Anesthesia Evaluation  Patient identified by MRN, date of birth, ID band Patient awake    Reviewed: Allergy & Precautions, H&P , NPO status , Patient's Chart, lab work & pertinent test results  Airway Mallampati: I TM Distance: >3 FB Neck ROM: Full    Dental  (+) Teeth Intact   Pulmonary          Cardiovascular     Neuro/Psych Depression    GI/Hepatic   Endo/Other  Hypothyroidism   Renal/GU      Musculoskeletal   Abdominal   Peds  Hematology   Anesthesia Other Findings   Reproductive/Obstetrics                          Anesthesia Physical Anesthesia Plan  ASA: II  Anesthesia Plan: General   Post-op Pain Management:    Induction: Intravenous  Airway Management Planned: Oral ETT  Additional Equipment:   Intra-op Plan:   Post-operative Plan: Extubation in OR  Informed Consent: I have reviewed the patients History and Physical, chart, labs and discussed the procedure including the risks, benefits and alternatives for the proposed anesthesia with the patient or authorized representative who has indicated his/her understanding and acceptance.     Plan Discussed with: CRNA and Surgeon  Anesthesia Plan Comments:         Anesthesia Quick Evaluation

## 2012-09-12 MED ORDER — OXYCODONE HCL 5 MG PO TABS
5.0000 mg | ORAL_TABLET | ORAL | Status: DC | PRN
  Administered 2012-09-12 – 2012-09-13 (×7): 10 mg via ORAL
  Administered 2012-09-14 – 2012-09-16 (×3): 5 mg via ORAL
  Filled 2012-09-12 (×2): qty 1
  Filled 2012-09-12 (×8): qty 2

## 2012-09-12 MED ORDER — OXYCODONE HCL 5 MG PO TABS: 5.0000 mg | ORAL_TABLET | ORAL | Status: DC | PRN

## 2012-09-12 MED ORDER — LORAZEPAM 0.5 MG PO TABS
0.5000 mg | ORAL_TABLET | Freq: Four times a day (QID) | ORAL | Status: DC | PRN
  Administered 2012-09-12: 0.5 mg via ORAL
  Filled 2012-09-12: qty 1

## 2012-09-12 NOTE — Progress Notes (Signed)
1 Day Post-Op  Subjective: Pain under better control with OxyIR, but has still not walked much except to BR x 1 this evening.  Drain OP 260 cc Husband and other family present and numerous questions answered.   Objective: Vital signs in last 24 hours: Temp:  [97.7 F (36.5 C)-99.5 F (37.5 C)] 98.4 F (36.9 C) (02/28 1737) Pulse Rate:  [86-115] 101 (02/28 1737) Resp:  [16-18] 18 (02/28 1737) BP: (106-133)/(43-51) 121/43 mmHg (02/28 1737) SpO2:  [95 %-100 %] 95 % (02/28 1737)    Intake/Output from previous day: 02/27 0701 - 02/28 0700 In: 2448.3 [P.O.:65; I.V.:2133.3; IV Piggyback:250] Out: 636 [Urine:276; Drains:260; Blood:100] Intake/Output this shift:    General appearance: alert, cooperative, appears stated age and mild distress Resp: clear to auscultation bilaterally Cardio: regular rate and rhythm Bilateral breast incisions intact, clean and dry. JP's draining bloody drainage bilaterally  Lab Results:  No results found for this basename: WBC, HGB, HCT, PLT,  in the last 72 hours BMET No results found for this basename: NA, K, CL, CO2, GLUCOSE, BUN, CREATININE, CALCIUM,  in the last 72 hours PT/INR No results found for this basename: LABPROT, INR,  in the last 72 hours ABG No results found for this basename: PHART, PCO2, PO2, HCO3,  in the last 72 hours  Studies/Results: Nm Sentinel Node Inj-no Rpt (breast)  09/11/2012  CLINICAL DATA: Invasive right breast cancer   Sulfur colloid was injected intradermally by the nuclear medicine  technologist for breast cancer sentinel node localization.      Anti-infectives: Anti-infectives   Start     Dose/Rate Route Frequency Ordered Stop   09/11/12 1800  Ampicillin-Sulbactam (UNASYN) 3 g in sodium chloride 0.9 % 100 mL IVPB     3 g 100 mL/hr over 60 Minutes Intravenous Every 6 hours 09/11/12 1739     09/11/12 1521  polymyxin B 500,000 Units, bacitracin 50,000 Units in sodium chloride irrigation 0.9 % 500 mL irrigation   Status:  Discontinued       As needed 09/11/12 1521 09/11/12 1558   09/11/12 1109  ceFAZolin (ANCEF) IVPB 2 g/50 mL premix     2 g 100 mL/hr over 30 Minutes Intravenous On call to O.R. 09/11/12 1110 09/11/12 1330   09/11/12 1107  ceFAZolin (ANCEF) IVPB 2 g/50 mL premix  Status:  Discontinued     2 g 100 mL/hr over 30 Minutes Intravenous On call to O.R. 09/11/12 1107 09/11/12 1111   09/11/12 1106  ceFAZolin (ANCEF) IVPB 2 g/50 mL premix  Status:  Discontinued     2 g 100 mL/hr over 30 Minutes Intravenous On call to O.R. 09/11/12 1106 09/11/12 1111   09/11/12 0600  ceFAZolin (ANCEF) IVPB 2 g/50 mL premix  Status:  Discontinued     2 g 100 mL/hr over 30 Minutes Intravenous On call to O.R. 09/10/12 1429 09/11/12 1111      Assessment/Plan: s/p Procedure(s) with comments: bilateral MASTECTOMY (Bilateral) AXILLARY SENTINEL lymph NODE  BIOPSY (Right) - right nuclear medicine injection 12:30  BREAST RECONSTRUCTION WITH PLACEMENT OF TISSUE EXPANDER AND FLEX HD (ACELLULAR HYDRATED DERMIS) ADM (Bilateral)  Needs to increase activity and tolerate po's more before discharge.  Discussed ambulation this evening with nursing.   LOS: 1 day    Nancy Beard 09/12/2012 Plastic Surgery [phone]

## 2012-09-12 NOTE — Progress Notes (Signed)
UR completed 

## 2012-09-12 NOTE — Progress Notes (Signed)
1 Day Post-Op  Subjective: Very sore.  Vicodin did not work.  Oxy IR is helping with the pain.  Needing assistance to get OOB.  Not eating much.  Objective: Vital signs in last 24 hours: Temp:  [97.5 F (36.4 C)-99.5 F (37.5 C)] 99.5 F (37.5 C) (02/28 0955) Pulse Rate:  [63-115] 115 (02/28 0955) Resp:  [10-22] 18 (02/28 0955) BP: (106-164)/(48-89) 113/51 mmHg (02/28 0955) SpO2:  [95 %-100 %] 95 % (02/28 0955)    Intake/Output from previous day: 02/27 0701 - 02/28 0700 In: 2448.3 [P.O.:65; I.V.:2133.3; IV Piggyback:250] Out: 636 [Urine:276; Drains:260; Blood:100] Intake/Output this shift: Total I/O In: -  Out: 60 [Drains:60]  PE: General- In NAD Chest-thin serosanguinous output from drains, dressings dry.  Lab Results:  No results found for this basename: WBC, HGB, HCT, PLT,  in the last 72 hours BMET No results found for this basename: NA, K, CL, CO2, GLUCOSE, BUN, CREATININE, CALCIUM,  in the last 72 hours PT/INR No results found for this basename: LABPROT, INR,  in the last 72 hours Comprehensive Metabolic Panel:    Component Value Date/Time   NA 141 09/08/2012 1500   NA 146* 07/02/2012 0825   K 4.4 09/08/2012 1500   K 3.3* 07/02/2012 0825   CL 104 09/08/2012 1500   CL 104 07/02/2012 0825   CO2 28 09/08/2012 1500   CO2 26 07/02/2012 0825   BUN 12 09/08/2012 1500   BUN 13.0 07/02/2012 0825   CREATININE 0.67 09/08/2012 1500   CREATININE 0.7 07/02/2012 0825   GLUCOSE 98 09/08/2012 1500   GLUCOSE 102* 07/02/2012 0825   CALCIUM 9.6 09/08/2012 1500   CALCIUM 9.0 07/02/2012 0825   AST 41* 09/08/2012 1500   AST 28 07/02/2012 0825   ALT 42* 09/08/2012 1500   ALT 32 07/02/2012 0825   ALKPHOS 129* 09/08/2012 1500   ALKPHOS 150 07/02/2012 0825   BILITOT 0.3 09/08/2012 1500   BILITOT 0.45 07/02/2012 0825   PROT 7.5 09/08/2012 1500   PROT 7.3 07/02/2012 0825   ALBUMIN 4.0 09/08/2012 1500   ALBUMIN 3.7 07/02/2012 0825     Studies/Results: Nm Sentinel Node Inj-no Rpt  (breast)  09/11/2012  CLINICAL DATA: Invasive right breast cancer   Sulfur colloid was injected intradermally by the nuclear medicine  technologist for breast cancer sentinel node localization.      Anti-infectives: Anti-infectives   Start     Dose/Rate Route Frequency Ordered Stop   09/11/12 1800  Ampicillin-Sulbactam (UNASYN) 3 g in sodium chloride 0.9 % 100 mL IVPB     3 g 100 mL/hr over 60 Minutes Intravenous Every 6 hours 09/11/12 1739     09/11/12 1521  polymyxin B 500,000 Units, bacitracin 50,000 Units in sodium chloride irrigation 0.9 % 500 mL irrigation  Status:  Discontinued       As needed 09/11/12 1521 09/11/12 1558   09/11/12 1109  ceFAZolin (ANCEF) IVPB 2 g/50 mL premix     2 g 100 mL/hr over 30 Minutes Intravenous On call to O.R. 09/11/12 1110 09/11/12 1330   09/11/12 1107  ceFAZolin (ANCEF) IVPB 2 g/50 mL premix  Status:  Discontinued     2 g 100 mL/hr over 30 Minutes Intravenous On call to O.R. 09/11/12 1107 09/11/12 1111   09/11/12 1106  ceFAZolin (ANCEF) IVPB 2 g/50 mL premix  Status:  Discontinued     2 g 100 mL/hr over 30 Minutes Intravenous On call to O.R. 09/11/12 1106 09/11/12 1111   09/11/12 0600  ceFAZolin (ANCEF) IVPB 2 g/50 mL premix  Status:  Discontinued     2 g 100 mL/hr over 30 Minutes Intravenous On call to O.R. 09/10/12 1429 09/11/12 1111      Assessment Principal Problem:   Cancer of lower-inner quadrant of female breast s/p bilateral mastectomies with tissue expander placement and right axillary SLNBx-poor pain control initially; improving some; having some anxiety. Not ready for discharge.    LOS: 1 day   Plan: Keep in hospital today.  Oxy IR for pain.  Ativan for anxiety.   Adolph Pollack 09/12/2012

## 2012-09-13 LAB — TSH: TSH: 1.242 u[IU]/mL (ref 0.350–4.500)

## 2012-09-13 LAB — CBC
HCT: 19.6 % — ABNORMAL LOW (ref 36.0–46.0)
Hemoglobin: 6.5 g/dL — CL (ref 12.0–15.0)
MCH: 25.4 pg — ABNORMAL LOW (ref 26.0–34.0)
MCHC: 33.2 g/dL (ref 30.0–36.0)
MCV: 76.6 fL — ABNORMAL LOW (ref 78.0–100.0)
Platelets: 171 10*3/uL (ref 150–400)
RBC: 2.56 MIL/uL — ABNORMAL LOW (ref 3.87–5.11)
RDW: 18.1 % — ABNORMAL HIGH (ref 11.5–15.5)
WBC: 8.7 10*3/uL (ref 4.0–10.5)

## 2012-09-13 LAB — PREPARE RBC (CROSSMATCH)

## 2012-09-13 MED ORDER — METHOCARBAMOL 500 MG PO TABS
500.0000 mg | ORAL_TABLET | Freq: Three times a day (TID) | ORAL | Status: DC
  Administered 2012-09-13 – 2012-09-16 (×12): 500 mg via ORAL
  Filled 2012-09-13 (×15): qty 1

## 2012-09-13 NOTE — Progress Notes (Signed)
2 Days Post-Op  Subjective: Still not mobilizing well.  Mildly tachycardic and somewhat pale this am.  No eating much either.  Seems groggy with OxyIR. Will try some Robaxin to help with pain issues.  JP OP 175 cc overnight, UOP excellent.  Objective: Vital signs in last 24 hours: Temp:  [98.3 F (36.8 C)-99.5 F (37.5 C)] 99.4 F (37.4 C) (03/01 0600) Pulse Rate:  [96-115] 110 (03/01 0600) Resp:  [16-18] 16 (03/01 0600) BP: (113-145)/(43-53) 134/53 mmHg (03/01 0600) SpO2:  [95 %-100 %] 98 % (03/01 0600)    Intake/Output from previous day: 02/28 0701 - 03/01 0700 In: 4027.1 [I.V.:4027.1] Out: 4825 [Urine:4650; Drains:175] Intake/Output this shift:    General appearance: cooperative, appears stated age, mild distress, pale and somnolent, but arouses to voice and appropriate Resp: clear to auscultation bilaterally Cardio: regular rate and rhythm and slightly tachycardic GI: soft, non-tender; bowel sounds normal; no masses,  no organomegaly Bilateral breast incisions are clean, dry and intact. JP drains miilked and draining sang drainage.   Lab Results:  No results found for this basename: WBC, HGB, HCT, PLT,  in the last 72 hours BMET No results found for this basename: NA, K, CL, CO2, GLUCOSE, BUN, CREATININE, CALCIUM,  in the last 72 hours PT/INR No results found for this basename: LABPROT, INR,  in the last 72 hours ABG No results found for this basename: PHART, PCO2, PO2, HCO3,  in the last 72 hours  Studies/Results: Nm Sentinel Node Inj-no Rpt (breast)  09/11/2012  CLINICAL DATA: Invasive right breast cancer   Sulfur colloid was injected intradermally by the nuclear medicine  technologist for breast cancer sentinel node localization.      Anti-infectives: Anti-infectives   Start     Dose/Rate Route Frequency Ordered Stop   09/11/12 1800  Ampicillin-Sulbactam (UNASYN) 3 g in sodium chloride 0.9 % 100 mL IVPB     3 g 100 mL/hr over 60 Minutes Intravenous Every 6  hours 09/11/12 1739     09/11/12 1521  polymyxin B 500,000 Units, bacitracin 50,000 Units in sodium chloride irrigation 0.9 % 500 mL irrigation  Status:  Discontinued       As needed 09/11/12 1521 09/11/12 1558   09/11/12 1109  ceFAZolin (ANCEF) IVPB 2 g/50 mL premix     2 g 100 mL/hr over 30 Minutes Intravenous On call to O.R. 09/11/12 1110 09/11/12 1330   09/11/12 1107  ceFAZolin (ANCEF) IVPB 2 g/50 mL premix  Status:  Discontinued     2 g 100 mL/hr over 30 Minutes Intravenous On call to O.R. 09/11/12 1107 09/11/12 1111   09/11/12 1106  ceFAZolin (ANCEF) IVPB 2 g/50 mL premix  Status:  Discontinued     2 g 100 mL/hr over 30 Minutes Intravenous On call to O.R. 09/11/12 1106 09/11/12 1111   09/11/12 0600  ceFAZolin (ANCEF) IVPB 2 g/50 mL premix  Status:  Discontinued     2 g 100 mL/hr over 30 Minutes Intravenous On call to O.R. 09/10/12 1429 09/11/12 1111      Assessment/Plan: s/p Procedure(s) with comments: bilateral MASTECTOMY (Bilateral) AXILLARY SENTINEL lymph NODE  BIOPSY (Right) - right nuclear medicine injection 12:30  BREAST RECONSTRUCTION WITH PLACEMENT OF TISSUE EXPANDER AND FLEX HD (ACELLULAR HYDRATED DERMIS) ADM (Bilateral) Mildly tachycardia- Dr. Derrell Lolling in to see and plans to check CBC, TSH and EKG. Will try adding Robaxin to help with pain as seems too somnolent with OxyIR.  Will need to stay another day due to these  issues.   LOS: 2 days    Franki Monte 09/13/2012 Plastic Surgery [phone]

## 2012-09-13 NOTE — Progress Notes (Signed)
CRITICAL VALUE ALERT  Critical value received:  Hemoglobin 6.5   Date of notification:  09/13/2012   Time of notification:  1228  Critical value read back:  Yes   Nurse who received alert:  Maureen Chatters, RN  MD notified (1st page):  Dr. Janee Morn     Time of first page:  1232  MD notified (2nd page):  Time of second page:  Responding MD:  [name], MD  Time MD responded:  [phone]

## 2012-09-13 NOTE — Progress Notes (Signed)
2 Days Post-Op  Subjective: Awake and alert. Eating a little better.  Still weak.  Ambulates to bathroom but a little shaky and has not been out of the hall. Voiding well with good urine output. BP normal. Regular  tachycardia at rest ... 110 to 115 by exam.  Denies chest pain, shortness of breath, nausea or vomiting.  Drainage seems thin and serosanguineous.  Objective: Vital signs in last 24 hours: Temp:  [98.3 F (36.8 C)-99.5 F (37.5 C)] 99.4 F (37.4 C) (03/01 0600) Pulse Rate:  [96-115] 110 (03/01 0600) Resp:  [16-18] 16 (03/01 0600) BP: (113-145)/(43-53) 134/53 mmHg (03/01 0600) SpO2:  [95 %-100 %] 98 % (03/01 0600)    Intake/Output from previous day: 02/28 0701 - 03/01 0700 In: 4027.1 [I.V.:4027.1] Out: 4825 [Urine:4650; Drains:175] Intake/Output this shift:    General appearance: alert. Cooperative. Appears fatigued but in no distress. Appears slightly pale Resp: clear to auscultation bilaterally Breasts: bilateral mastectomy skin flaps pink and healthy. No skin necrosis. No hematoma. Drainage thin and serosanguineous. All drains functioning GI: soft, non-tender; bowel sounds normal; no masses,  no organomegaly  Lab Results:  No results found for this or any previous visit (from the past 24 hour(s)).   Studies/Results: @RISRSLT24 @  . ampicillin-sulbactam (UNASYN) IV  3 g Intravenous Q6H  . docusate sodium  100 mg Oral TID  . levothyroxine  25 mcg Oral QAC breakfast  . liothyronine  12.5 mcg Oral Daily  . pantoprazole (PROTONIX) IV  40 mg Intravenous QHS     Assessment/Plan: s/p Procedure(s): bilateral MASTECTOMY AXILLARY SENTINEL lymph NODE  BIOPSY BREAST RECONSTRUCTION WITH PLACEMENT OF TISSUE EXPANDER AND FLEX HD (ACELLULAR HYDRATED DERMIS) ADM  POD #2. Stable but weak. And this may be due to pain medication. Seems well-hydrated. We'll check CBC, TSH, and EKG Not ready for discharge  @PROBHOSP @  LOS: 2 days    Nancy Beard M. Derrell Lolling, M.D.,  [facility] General and Minimally invasive Surgery Breast and Colorectal Surgery Office:   [phone] Pager:   [phone]  09/13/2012  . .prob

## 2012-09-13 NOTE — Progress Notes (Signed)
Patient ID: Nancy Beard, female   DOB: 1955-08-29, 57 y.o.   MRN: [id number] Noted Hb 6.5.  Will transfuse 2u PRBC. On exam JPs are serosanguinous, R incision CDI, no sig hematoma, L incision CDI with mild ecchymosis and possible small hematoma - not really impressive.  I notified plastics service and I spoke at length with her family at the bedside.  Hb/HCT to be done post-transfusion, CBC in AM.  EKG still P but suspect sinus tach due to ABL. [name], MD, MPH, FACS Pager: [phone]

## 2012-09-14 ENCOUNTER — Encounter (HOSPITAL_COMMUNITY): Payer: Self-pay | Admitting: General Surgery

## 2012-09-14 LAB — TYPE AND SCREEN
ABO/RH(D): O POS
Antibody Screen: NEGATIVE
Unit division: 0
Unit division: 0

## 2012-09-14 LAB — CBC
HCT: 24.4 % — ABNORMAL LOW (ref 36.0–46.0)
Hemoglobin: 8.3 g/dL — ABNORMAL LOW (ref 12.0–15.0)
MCH: 26.6 pg (ref 26.0–34.0)
MCHC: 34 g/dL (ref 30.0–36.0)
MCV: 78.2 fL (ref 78.0–100.0)
Platelets: 162 10*3/uL (ref 150–400)
RBC: 3.12 MIL/uL — ABNORMAL LOW (ref 3.87–5.11)
RDW: 17.1 % — ABNORMAL HIGH (ref 11.5–15.5)
WBC: 8.2 10*3/uL (ref 4.0–10.5)

## 2012-09-14 NOTE — Progress Notes (Signed)
Second unit of PRCB completed at 2am. No need to write/draw  post transfussion H/H orders per Dr. Janee Morn. Will draw scheduled CBC this morning. Pt. Is foot stick only. Will continue to monitor.  KYoung RN

## 2012-09-14 NOTE — Progress Notes (Signed)
3 Days Post-Op  Subjective: Patient doing better, awake and alert.  Took walk today down the hall.  Objective: Vital signs in last 24 hours: Temp:  [97.7 F (36.5 C)-100.1 F (37.8 C)] 98 F (36.7 C) (03/02 0525) Pulse Rate:  [83-113] 84 (03/02 0525) Resp:  [16-18] 18 (03/02 0525) BP: (113-135)/(47-58) 135/55 mmHg (03/02 0525) SpO2:  [94 %-99 %] 94 % (03/02 0525) Weight:  [66.633 kg (146 lb 14.4 oz)] 66.633 kg (146 lb 14.4 oz) (03/01 1356) Last BM Date: 10/10/12  Intake/Output from previous day: 03/01 0701 - 03/02 0700 In: 1800 [I.V.:1125; Blood:675] Out: 1985 [Urine:1900; Drains:85] Intake/Output this shift:    General appearance: alert, cooperative and no distress Head: Normocephalic, without obvious abnormality, atraumatic Breasts: normal appearance, no masses or tenderness, flaps warm and dry with good color and capillary refill Incision/Wound:  Lab Results:   Recent Labs  09/13/12 1155 09/14/12 0404  WBC 8.7 8.2  HGB 6.5* 8.3*  HCT 19.6* 24.4*  PLT 171 162   BMET No results found for this basename: NA, K, CL, CO2, GLUCOSE, BUN, CREATININE, CALCIUM,  in the last 72 hours PT/INR No results found for this basename: LABPROT, INR,  in the last 72 hours ABG No results found for this basename: PHART, PCO2, PO2, HCO3,  in the last 72 hours  Studies/Results: No results found.  Anti-infectives: Anti-infectives   Start     Dose/Rate Route Frequency Ordered Stop   09/11/12 1800  Ampicillin-Sulbactam (UNASYN) 3 g in sodium chloride 0.9 % 100 mL IVPB     3 g 100 mL/hr over 60 Minutes Intravenous Every 6 hours 09/11/12 1739     09/11/12 1521  polymyxin B 500,000 Units, bacitracin 50,000 Units in sodium chloride irrigation 0.9 % 500 mL irrigation  Status:  Discontinued       As needed 09/11/12 1521 09/11/12 1558   09/11/12 1109  ceFAZolin (ANCEF) IVPB 2 g/50 mL premix     2 g 100 mL/hr over 30 Minutes Intravenous On call to O.R. 09/11/12 1110 09/11/12 1330   09/11/12  1107  ceFAZolin (ANCEF) IVPB 2 g/50 mL premix  Status:  Discontinued     2 g 100 mL/hr over 30 Minutes Intravenous On call to O.R. 09/11/12 1107 09/11/12 1111   09/11/12 1106  ceFAZolin (ANCEF) IVPB 2 g/50 mL premix  Status:  Discontinued     2 g 100 mL/hr over 30 Minutes Intravenous On call to O.R. 09/11/12 1106 09/11/12 1111   09/11/12 0600  ceFAZolin (ANCEF) IVPB 2 g/50 mL premix  Status:  Discontinued     2 g 100 mL/hr over 30 Minutes Intravenous On call to O.R. 09/10/12 1429 09/11/12 1111      Assessment/Plan: s/p Procedure(s) with comments: bilateral MASTECTOMY (Bilateral) AXILLARY SENTINEL lymph NODE  BIOPSY (Right) - right nuclear medicine injection 12:30  BREAST RECONSTRUCTION WITH PLACEMENT OF TISSUE EXPANDER AND FLEX HD (ACELLULAR HYDRATED DERMIS) ADM (Bilateral) Plan for discharge tomorrow  LOS: 3 days    [facility] 09/14/2012

## 2012-09-14 NOTE — Progress Notes (Signed)
3 Days Post-Op  Subjective: Transfused 2 units of PRBC yesterday. Hemoglobin went from 6.5-8.3. No evidence of bleeding. Feels better but still not strong enough to ambulating halls. Heart rate down to 84. EKG shows sinus tachycardia, no acute injury.TSH 1.24, normal.  Objective: Vital signs in last 24 hours: Temp:  [97.7 F (36.5 C)-100.1 F (37.8 C)] 98 F (36.7 C) (03/02 0525) Pulse Rate:  [83-113] 84 (03/02 0525) Resp:  [16-18] 18 (03/02 0525) BP: (113-135)/(47-58) 135/55 mmHg (03/02 0525) SpO2:  [94 %-99 %] 94 % (03/02 0525) Weight:  [146 lb 14.4 oz (66.633 kg)] 146 lb 14.4 oz (66.633 kg) (03/01 1356) Last BM Date: 10/10/12  Intake/Output from previous day: 03/01 0701 - 03/02 0700 In: 1800 [I.V.:1125; Blood:675] Out: 1985 [Urine:1900; Drains:85] Intake/Output this shift:      General appearance: alert. Cooperative. In no distress. Mental status normal. Appears a little deconditioned. Breasts: normal appearance, no masses or tenderness, bilateral mastectomy incisions and skin flaps are healing uneventfully. Slight ecchymoses on left but no significant hematoma, tissues are soft.Drainage serosanguineous, all drains functioning.  Lab Results:  Results for orders placed during the hospital encounter of 09/11/12 (from the past 24 hour(s))  CBC     Status: Abnormal   Collection Time    09/13/12 11:55 AM      Result Value Range   WBC 8.7  4.0 - 10.5 K/uL   RBC 2.56 (*) 3.87 - 5.11 MIL/uL   Hemoglobin 6.5 (*) 12.0 - 15.0 g/dL   HCT 16.1 (*) 09.6 - 04.5 %   MCV 76.6 (*) 78.0 - 100.0 fL   MCH 25.4 (*) 26.0 - 34.0 pg   MCHC 33.2  30.0 - 36.0 g/dL   RDW 40.9 (*) 81.1 - 91.4 %   Platelets 171  150 - 400 K/uL  TSH     Status: None   Collection Time    09/13/12 11:55 AM      Result Value Range   TSH 1.242  0.350 - 4.500 uIU/mL  PREPARE RBC (CROSSMATCH)     Status: None   Collection Time    09/13/12  1:16 PM      Result Value Range   Order Confirmation ORDER PROCESSED BY  BLOOD BANK    TYPE AND SCREEN     Status: None   Collection Time    09/13/12  1:16 PM      Result Value Range   ABO/RH(D) O POS     Antibody Screen NEG     Sample Expiration 09/16/2012     Unit Number [id number]     Blood Component Type RBC LR PHER2     Unit division 00     Status of Unit ISSUED     Transfusion Status OK TO TRANSFUSE     Crossmatch Result Compatible     Unit Number [id number]     Blood Component Type RED CELLS,LR     Unit division 00     Status of Unit ISSUED     Transfusion Status OK TO TRANSFUSE     Crossmatch Result Compatible    CBC     Status: Abnormal   Collection Time    09/14/12  4:04 AM      Result Value Range   WBC 8.2  4.0 - 10.5 K/uL   RBC 3.12 (*) 3.87 - 5.11 MIL/uL   Hemoglobin 8.3 (*) 12.0 - 15.0 g/dL   HCT 32.4 (*) 40.1 - 02.7 %   MCV 78.2  78.0 - 100.0 fL   MCH 26.6  26.0 - 34.0 pg   MCHC 34.0  30.0 - 36.0 g/dL   RDW 40.9 (*) 81.1 - 91.4 %   Platelets 162  150 - 400 K/uL     Studies/Results: @RISRSLT24 @  . ampicillin-sulbactam (UNASYN) IV  3 g Intravenous Q6H  . docusate sodium  100 mg Oral TID  . levothyroxine  25 mcg Oral QAC breakfast  . liothyronine  12.5 mcg Oral Daily  . methocarbamol  500 mg Oral TID  . pantoprazole (PROTONIX) IV  40 mg Intravenous QHS     Assessment/Plan: s/p Procedure(s): bilateral MASTECTOMY AXILLARY SENTINEL lymph NODE  BIOPSY BREAST RECONSTRUCTION WITH PLACEMENT OF TISSUE EXPANDER AND FLEX HD (ACELLULAR HYDRATED DERMIS) ADM  POD #3. Stable but weak. Tolerating diet fair. Ambulate to bathroom.  Acute blood loss anemia, transfused yesterday with appropriate rise in hemoglobin. No evidence for ongoing bleeding. No significant wound hematoma  Not ready for discharge. Discussed care plan with patient, husband, and son.   @PROBHOSP @  LOS: 3 days    INGRAM,HAYWOOD M 09/14/2012  . .prob

## 2012-09-15 ENCOUNTER — Other Ambulatory Visit: Payer: Self-pay | Admitting: Plastic Surgery

## 2012-09-15 DIAGNOSIS — C50912 Malignant neoplasm of unspecified site of left female breast: Secondary | ICD-10-CM

## 2012-09-15 DIAGNOSIS — T888XXS Other specified complications of surgical and medical care, not elsewhere classified, sequela: Secondary | ICD-10-CM

## 2012-09-15 LAB — CBC
HCT: 30.3 % — ABNORMAL LOW (ref 36.0–46.0)
HCT: 27.8 % — ABNORMAL LOW (ref 36.0–46.0)
Hemoglobin: 10.1 g/dL — ABNORMAL LOW (ref 12.0–15.0)
Hemoglobin: 9.4 g/dL — ABNORMAL LOW (ref 12.0–15.0)
MCH: 26.3 pg (ref 26.0–34.0)
MCH: 26.6 pg (ref 26.0–34.0)
MCHC: 33.3 g/dL (ref 30.0–36.0)
MCHC: 33.8 g/dL (ref 30.0–36.0)
MCV: 78.9 fL (ref 78.0–100.0)
MCV: 78.5 fL (ref 78.0–100.0)
Platelets: 278 10*3/uL (ref 150–400)
Platelets: 274 10*3/uL (ref 150–400)
RBC: 3.84 MIL/uL — ABNORMAL LOW (ref 3.87–5.11)
RBC: 3.54 MIL/uL — ABNORMAL LOW (ref 3.87–5.11)
RDW: 17.6 % — ABNORMAL HIGH (ref 11.5–15.5)
RDW: 17.5 % — ABNORMAL HIGH (ref 11.5–15.5)
WBC: 8 10*3/uL (ref 4.0–10.5)
WBC: 7.9 10*3/uL (ref 4.0–10.5)

## 2012-09-15 LAB — COMPREHENSIVE METABOLIC PANEL
ALT: 24 U/L (ref 0–35)
AST: 32 U/L (ref 0–37)
Albumin: 2.8 g/dL — ABNORMAL LOW (ref 3.5–5.2)
Alkaline Phosphatase: 142 U/L — ABNORMAL HIGH (ref 39–117)
BUN: 4 mg/dL — ABNORMAL LOW (ref 6–23)
CO2: 26 mEq/L (ref 19–32)
Calcium: 9 mg/dL (ref 8.4–10.5)
Chloride: 103 mEq/L (ref 96–112)
Creatinine, Ser: 0.54 mg/dL (ref 0.50–1.10)
GFR calc Af Amer: >90 mL/min (ref >90)
GFR calc non Af Amer: >90 mL/min (ref >90)
Glucose, Bld: 110 mg/dL — ABNORMAL HIGH (ref 70–99)
Potassium: 3.6 mEq/L (ref 3.5–5.1)
Sodium: 138 mEq/L (ref 135–145)
Total Bilirubin: 0.8 mg/dL (ref 0.3–1.2)
Total Protein: 6.4 g/dL (ref 6.0–8.3)

## 2012-09-15 LAB — PROTIME-INR
INR: 0.97 (ref 0.00–1.49)
Prothrombin Time: 12.8 seconds (ref 11.6–15.2)

## 2012-09-15 LAB — APTT: aPTT: 24 seconds (ref 24–37)

## 2012-09-15 MED ORDER — LACTATED RINGERS IV SOLN
INTRAVENOUS | Status: DC
  Administered 2012-09-15 – 2012-09-16 (×2): via INTRAVENOUS

## 2012-09-15 MED ORDER — DIAZEPAM 5 MG/ML IJ SOLN
2.0000 mg | Freq: Once | INTRAMUSCULAR | Status: AC
  Administered 2012-09-15: 2 mg via INTRAVENOUS
  Filled 2012-09-15: qty 2

## 2012-09-15 MED ORDER — CEFAZOLIN SODIUM-DEXTROSE 2-3 GM-% IV SOLR
2.0000 g | INTRAVENOUS | Status: AC
  Administered 2012-09-16: 2 g via INTRAVENOUS
  Filled 2012-09-15: qty 50

## 2012-09-15 NOTE — Progress Notes (Signed)
I agree with the above plan 

## 2012-09-15 NOTE — Progress Notes (Signed)
I agree with the above note and have seen the patient

## 2012-09-15 NOTE — Progress Notes (Signed)
4 Days Post-Op  Subjective: Pt feeling poorly again this am. Looks pale.  Left breast more edematous, ecchymotic today and JP drainage more bloody than serous.  Low grade fever today as well.  Dr. Abbey Chatters in to see as well and has ordered repeat CBC and coags.   Objective: Vital signs in last 24 hours: Temp:  [98.2 F (36.8 C)-99 F (37.2 C)] 99 F (37.2 C) (03/03 0606) Pulse Rate:  [84-91] 91 (03/03 0606) Resp:  [16-17] 16 (03/03 0606) BP: (140-150)/(59-75) 140/75 mmHg (03/03 0606) SpO2:  [96 %-98 %] 97 % (03/03 0606) Last BM Date: 10/10/12  Intake/Output from previous day: 03/02 0701 - 03/03 0700 In: 3368.8 [P.O.:390; I.V.:2978.8] Out: 3545 [Urine:3425; Drains:120] Intake/Output this shift:    General appearance: alert, cooperative, appears stated age, mild distress and pale Resp: clear to auscultation bilaterally Breasts: Bilateral breast incisions are clean, dry and intact, but left breast is edematous and ecchymotic as noted.  Cardio: regular rate and rhythm and slightly tachycardic GI: soft, non-tender; bowel sounds normal; no masses,  no organomegaly  Lab Results:   Recent Labs  09/13/12 1155 09/14/12 0404  WBC 8.7 8.2  HGB 6.5* 8.3*  HCT 19.6* 24.4*  PLT 171 162   BMET No results found for this basename: NA, K, CL, CO2, GLUCOSE, BUN, CREATININE, CALCIUM,  in the last 72 hours PT/INR No results found for this basename: LABPROT, INR,  in the last 72 hours ABG No results found for this basename: PHART, PCO2, PO2, HCO3,  in the last 72 hours  Studies/Results: No results found.  Anti-infectives: Anti-infectives   Start     Dose/Rate Route Frequency Ordered Stop   09/11/12 1800  Ampicillin-Sulbactam (UNASYN) 3 g in sodium chloride 0.9 % 100 mL IVPB     3 g 100 mL/hr over 60 Minutes Intravenous Every 6 hours 09/11/12 1739     09/11/12 1521  polymyxin B 500,000 Units, bacitracin 50,000 Units in sodium chloride irrigation 0.9 % 500 mL irrigation  Status:   Discontinued       As needed 09/11/12 1521 09/11/12 1558   09/11/12 1109  ceFAZolin (ANCEF) IVPB 2 g/50 mL premix     2 g 100 mL/hr over 30 Minutes Intravenous On call to O.R. 09/11/12 1110 09/11/12 1330   09/11/12 1107  ceFAZolin (ANCEF) IVPB 2 g/50 mL premix  Status:  Discontinued     2 g 100 mL/hr over 30 Minutes Intravenous On call to O.R. 09/11/12 1107 09/11/12 1111   09/11/12 1106  ceFAZolin (ANCEF) IVPB 2 g/50 mL premix  Status:  Discontinued     2 g 100 mL/hr over 30 Minutes Intravenous On call to O.R. 09/11/12 1106 09/11/12 1111   09/11/12 0600  ceFAZolin (ANCEF) IVPB 2 g/50 mL premix  Status:  Discontinued     2 g 100 mL/hr over 30 Minutes Intravenous On call to O.R. 09/10/12 1429 09/11/12 1111      Assessment/Plan: s/p Procedure(s) with comments: bilateral MASTECTOMY (Bilateral) AXILLARY SENTINEL lymph NODE  BIOPSY (Right) - right nuclear medicine injection 12:30  BREAST RECONSTRUCTION WITH PLACEMENT OF TISSUE EXPANDER AND FLEX HD (ACELLULAR HYDRATED DERMIS) ADM (Bilateral) Left breast now with hematoma- Will try to express and fill expander with more saline to try and tamponade. May need re-exploration if this does not improve. Discussed with patient and husband.   Will keep npo for now.  Acute blood loss anemia- Await CBC and coags.    LOS: 4 days    RAYBURN,SHAWN,  PA-C 09/15/2012 Plastic Surgery [phone]

## 2012-09-15 NOTE — Progress Notes (Signed)
4 Days Post-Op  Subjective: Feels weak.  Has been walking.  Husband reports she looks more pale this AM.  Some nausea.  Objective: Vital signs in last 24 hours: Temp:  [98.2 F (36.8 C)-99 F (37.2 C)] 99 F (37.2 C) (03/03 0606) Pulse Rate:  [84-91] 91 (03/03 0606) Resp:  [16-17] 16 (03/03 0606) BP: (140-150)/(59-75) 140/75 mmHg (03/03 0606) SpO2:  [96 %-98 %] 97 % (03/03 0606) Last BM Date: 10/10/12  Intake/Output from previous day: 03/02 0701 - 03/03 0700 In: 3368.8 [P.O.:390; I.V.:2978.8] Out: 3545 [Urine:3425; Drains:120] Intake/Output this shift:    PE: General- In NAD Chest-swelling and ecchymosis on left side with dark, bloody drain output; right flap looks good with serosanguinous drain output  Lab Results:   Recent Labs  09/13/12 1155 09/14/12 0404  WBC 8.7 8.2  HGB 6.5* 8.3*  HCT 19.6* 24.4*  PLT 171 162   BMET No results found for this basename: NA, K, CL, CO2, GLUCOSE, BUN, CREATININE, CALCIUM,  in the last 72 hours PT/INR No results found for this basename: LABPROT, INR,  in the last 72 hours Comprehensive Metabolic Panel:    Component Value Date/Time   NA 141 09/08/2012 1500   NA 146* 07/02/2012 0825   K 4.4 09/08/2012 1500   K 3.3* 07/02/2012 0825   CL 104 09/08/2012 1500   CL 104 07/02/2012 0825   CO2 28 09/08/2012 1500   CO2 26 07/02/2012 0825   BUN 12 09/08/2012 1500   BUN 13.0 07/02/2012 0825   CREATININE 0.67 09/08/2012 1500   CREATININE 0.7 07/02/2012 0825   GLUCOSE 98 09/08/2012 1500   GLUCOSE 102* 07/02/2012 0825   CALCIUM 9.6 09/08/2012 1500   CALCIUM 9.0 07/02/2012 0825   AST 41* 09/08/2012 1500   AST 28 07/02/2012 0825   ALT 42* 09/08/2012 1500   ALT 32 07/02/2012 0825   ALKPHOS 129* 09/08/2012 1500   ALKPHOS 150 07/02/2012 0825   BILITOT 0.3 09/08/2012 1500   BILITOT 0.45 07/02/2012 0825   PROT 7.5 09/08/2012 1500   PROT 7.3 07/02/2012 0825   ALBUMIN 4.0 09/08/2012 1500   ALBUMIN 3.7 07/02/2012 0825     Studies/Results: No  results found.  Anti-infectives: Anti-infectives   Start     Dose/Rate Route Frequency Ordered Stop   09/11/12 1800  Ampicillin-Sulbactam (UNASYN) 3 g in sodium chloride 0.9 % 100 mL IVPB     3 g 100 mL/hr over 60 Minutes Intravenous Every 6 hours 09/11/12 1739     09/11/12 1521  polymyxin B 500,000 Units, bacitracin 50,000 Units in sodium chloride irrigation 0.9 % 500 mL irrigation  Status:  Discontinued       As needed 09/11/12 1521 09/11/12 1558   09/11/12 1109  ceFAZolin (ANCEF) IVPB 2 g/50 mL premix     2 g 100 mL/hr over 30 Minutes Intravenous On call to O.R. 09/11/12 1110 09/11/12 1330   09/11/12 1107  ceFAZolin (ANCEF) IVPB 2 g/50 mL premix  Status:  Discontinued     2 g 100 mL/hr over 30 Minutes Intravenous On call to O.R. 09/11/12 1107 09/11/12 1111   09/11/12 1106  ceFAZolin (ANCEF) IVPB 2 g/50 mL premix  Status:  Discontinued     2 g 100 mL/hr over 30 Minutes Intravenous On call to O.R. 09/11/12 1106 09/11/12 1111   09/11/12 0600  ceFAZolin (ANCEF) IVPB 2 g/50 mL premix  Status:  Discontinued     2 g 100 mL/hr over 30 Minutes Intravenous On call to  O.R. 09/10/12 1429 09/11/12 1111      Assessment Principal Problem: 1.  Right breast cancer s/p bilateral mastectomies, right axillary SLNBx, bilateral tissue expander placement-has post op bleeding and a hematoma on the left.  2.  ABL anemia-hgb 8.3 yesterday after 2 unit prbc transfusion.    LOS: 4 days   Plan: Check CBC, INR, PTT.  Will await Dr. Leonie Green evaluation of her.   Tennyson Kallen J 09/15/2012

## 2012-09-15 NOTE — Progress Notes (Signed)
4 Days Post-Op  Subjective: Bilateral mastectomies, overall feeling better.  Objective: Vital signs in last 24 hours: Temp:  [98.2 F (36.8 C)-99 F (37.2 C)] 99 F (37.2 C) (03/03 0606) Pulse Rate:  [84-91] 91 (03/03 0606) Resp:  [16-17] 16 (03/03 0606) BP: (140-146)/(63-75) 140/75 mmHg (03/03 0606) SpO2:  [96 %-97 %] 97 % (03/03 0606) Last BM Date: 10/10/12  Intake/Output from previous day: 03/02 0701 - 03/03 0700 In: 3368.8 [P.O.:390; I.V.:2978.8] Out: 3545 [Urine:3425; Drains:120] Intake/Output this shift: Total I/O In: 240 [P.O.:240] Out: 100 [Drains:100]  General appearance: alert, cooperative and no distress appearance of blood in the left pocket  Lab Results:   Recent Labs  09/14/12 0404 09/15/12 0920  WBC 8.2 8.0  HGB 8.3* 10.1*  HCT 24.4* 30.3*  PLT 162 278   BMET No results found for this basename: NA, K, CL, CO2, GLUCOSE, BUN, CREATININE, CALCIUM,  in the last 72 hours PT/INR  Recent Labs  09/15/12 0920  LABPROT 12.8  INR 0.97   ABG No results found for this basename: PHART, PCO2, PO2, HCO3,  in the last 72 hours  Studies/Results: No results found.  Anti-infectives: Anti-infectives   Start     Dose/Rate Route Frequency Ordered Stop   09/11/12 1800  Ampicillin-Sulbactam (UNASYN) 3 g in sodium chloride 0.9 % 100 mL IVPB     3 g 100 mL/hr over 60 Minutes Intravenous Every 6 hours 09/11/12 1739     09/11/12 1521  polymyxin B 500,000 Units, bacitracin 50,000 Units in sodium chloride irrigation 0.9 % 500 mL irrigation  Status:  Discontinued       As needed 09/11/12 1521 09/11/12 1558   09/11/12 1109  ceFAZolin (ANCEF) IVPB 2 g/50 mL premix     2 g 100 mL/hr over 30 Minutes Intravenous On call to O.R. 09/11/12 1110 09/11/12 1330   09/11/12 1107  ceFAZolin (ANCEF) IVPB 2 g/50 mL premix  Status:  Discontinued     2 g 100 mL/hr over 30 Minutes Intravenous On call to O.R. 09/11/12 1107 09/11/12 1111   09/11/12 1106  ceFAZolin (ANCEF) IVPB 2 g/50  mL premix  Status:  Discontinued     2 g 100 mL/hr over 30 Minutes Intravenous On call to O.R. 09/11/12 1106 09/11/12 1111   09/11/12 0600  ceFAZolin (ANCEF) IVPB 2 g/50 mL premix  Status:  Discontinued     2 g 100 mL/hr over 30 Minutes Intravenous On call to O.R. 09/10/12 1429 09/11/12 1111      Assessment/Plan: s/p Procedure(s) with comments: bilateral MASTECTOMY (Bilateral) AXILLARY SENTINEL lymph NODE  BIOPSY (Right) - right nuclear medicine injection 12:30  BREAST RECONSTRUCTION WITH PLACEMENT OF TISSUE EXPANDER AND FLEX HD (ACELLULAR HYDRATED DERMIS) ADM (Bilateral) plan OR for evacuation of hematoma  LOS: 4 days    [facility] 09/15/2012

## 2012-09-15 NOTE — Progress Notes (Signed)
I agree with the above note and have seen the patient 

## 2012-09-15 NOTE — Progress Notes (Signed)
Patient ID: Nancy Beard, female   DOB: 03/28/1956, 57 y.o.   MRN: [id number]   Procedure note: The left breast was massaged and additional 75 cc of bloody fluid expressed into JP drain. The left breast was then filled with 50 cc of sterile injectable saline without difficulty. She tolerated the procedure well and states that the left breast does fill better following this.  Will obtain a smaller breast binder to help with compression.   Hgb was okay at 10.1 and INR 0.97, platelets 278K  Will monitor closely over next 24 hours.    RAYBURN,SHAWN,PA-C Plastic Surgery [phone]

## 2012-09-16 ENCOUNTER — Inpatient Hospital Stay (HOSPITAL_COMMUNITY): Payer: Private Health Insurance - Indemnity | Admitting: Anesthesiology

## 2012-09-16 ENCOUNTER — Telehealth (INDEPENDENT_AMBULATORY_CARE_PROVIDER_SITE_OTHER): Payer: Self-pay | Admitting: General Surgery

## 2012-09-16 ENCOUNTER — Encounter (HOSPITAL_COMMUNITY)
Admission: RE | Disposition: A | Discharge status: Home / Self Care | Payer: Self-pay | Source: Ambulatory Visit | Attending: General Surgery

## 2012-09-16 ENCOUNTER — Encounter (HOSPITAL_COMMUNITY): Payer: Self-pay | Admitting: Plastic Surgery

## 2012-09-16 ENCOUNTER — Encounter (HOSPITAL_COMMUNITY): Payer: Self-pay | Admitting: Anesthesiology

## 2012-09-16 HISTORY — PX: INCISION AND DRAINAGE OF WOUND: SHX1803

## 2012-09-16 SURGERY — IRRIGATION AND DEBRIDEMENT WOUND
Anesthesia: General | Site: Breast | Laterality: Left | Wound class: Clean

## 2012-09-16 MED ORDER — MEPERIDINE HCL 25 MG/ML IJ SOLN: 6.2500 mg | INTRAMUSCULAR | Status: DC | PRN

## 2012-09-16 MED ORDER — MIDAZOLAM HCL 2 MG/2ML IJ SOLN: 0.5000 mg | Freq: Once | INTRAMUSCULAR | Status: DC | PRN

## 2012-09-16 MED ORDER — DIAZEPAM 2 MG PO TABS: 2.0000 mg | ORAL_TABLET | Freq: Two times a day (BID) | ORAL | Status: DC | PRN

## 2012-09-16 MED ORDER — PROPOFOL 10 MG/ML IV BOLUS
INTRAVENOUS | Status: DC | PRN
  Administered 2012-09-16: 150 mg via INTRAVENOUS
  Administered 2012-09-16: 50 mg via INTRAVENOUS

## 2012-09-16 MED ORDER — MIDAZOLAM HCL 5 MG/5ML IJ SOLN
INTRAMUSCULAR | Status: DC | PRN
  Administered 2012-09-16: 2 mg via INTRAVENOUS

## 2012-09-16 MED ORDER — KETOROLAC TROMETHAMINE 30 MG/ML IJ SOLN: 15.0000 mg | Freq: Once | INTRAMUSCULAR | Status: DC | PRN

## 2012-09-16 MED ORDER — SODIUM CHLORIDE 0.9 % IR SOLN
Status: DC | PRN
  Administered 2012-09-16: 1000 mL

## 2012-09-16 MED ORDER — DOCUSATE SODIUM 100 MG PO CAPS
100.0000 mg | ORAL_CAPSULE | Freq: Three times a day (TID) | ORAL | Status: DC
  Administered 2012-09-16 (×2): 100 mg via ORAL

## 2012-09-16 MED ORDER — POLYMYXIN B SULFATE 500000 UNITS IJ SOLR
INTRAMUSCULAR | Status: DC | PRN
  Administered 2012-09-16: 08:00:00

## 2012-09-16 MED ORDER — PROMETHAZINE HCL 25 MG/ML IJ SOLN: 6.2500 mg | INTRAMUSCULAR | Status: DC | PRN

## 2012-09-16 MED ORDER — LIDOCAINE HCL (CARDIAC) 20 MG/ML IV SOLN
INTRAVENOUS | Status: DC | PRN
  Administered 2012-09-16: 60 mg via INTRAVENOUS

## 2012-09-16 MED ORDER — HYDROCODONE-ACETAMINOPHEN 5-325 MG PO TABS: 1.0000 | ORAL_TABLET | Freq: Four times a day (QID) | ORAL | Status: DC | PRN

## 2012-09-16 MED ORDER — FENTANYL CITRATE 0.05 MG/ML IJ SOLN
INTRAMUSCULAR | Status: DC | PRN
  Administered 2012-09-16 (×2): 25 ug via INTRAVENOUS

## 2012-09-16 MED ORDER — ONDANSETRON HCL 4 MG/2ML IJ SOLN
INTRAMUSCULAR | Status: DC | PRN
  Administered 2012-09-16: 4 mg via INTRAVENOUS

## 2012-09-16 MED ORDER — FENTANYL CITRATE 0.05 MG/ML IJ SOLN: 25.0000 ug | INTRAMUSCULAR | Status: DC | PRN

## 2012-09-16 MED ORDER — DOUBLE ANTIBIOTIC 500-10000 UNIT/GM EX OINT
TOPICAL_OINTMENT | CUTANEOUS | Status: AC
  Filled 2012-09-16: qty 1

## 2012-09-16 SURGICAL SUPPLY — 30 items
ADH SKN CLS APL DERMABOND .7 (GAUZE/BANDAGES/DRESSINGS) ×1
BANDAGE GAUZE ELAST BULKY 4 IN (GAUZE/BANDAGES/DRESSINGS) IMPLANT
BLADE SURG ROTATE 9660 (MISCELLANEOUS) IMPLANT
CANISTER SUCTION 2500CC (MISCELLANEOUS) ×2 IMPLANT
CHLORAPREP W/TINT 26ML (MISCELLANEOUS) IMPLANT
CLOTH BEACON ORANGE TIMEOUT ST (SAFETY) ×2 IMPLANT
COVER SURGICAL LIGHT HANDLE (MISCELLANEOUS) ×2 IMPLANT
DERMABOND ADVANCED (GAUZE/BANDAGES/DRESSINGS) ×1
DERMABOND ADVANCED .7 DNX12 (GAUZE/BANDAGES/DRESSINGS) IMPLANT
DRAPE PED LAPAROTOMY (DRAPES) ×2 IMPLANT
DRAPE PROXIMA HALF (DRAPES) ×1 IMPLANT
DRSG PAD ABDOMINAL 8X10 ST (GAUZE/BANDAGES/DRESSINGS) ×2 IMPLANT
ELECT CAUTERY BLADE 6.4 (BLADE) ×2 IMPLANT
ELECT REM PT RETURN 9FT ADLT (ELECTROSURGICAL) ×2
ELECTRODE REM PT RTRN 9FT ADLT (ELECTROSURGICAL) ×1 IMPLANT
GLOVE BIO SURGEON STRL SZ 6.5 (GLOVE) ×2 IMPLANT
GOWN STRL NON-REIN LRG LVL3 (GOWN DISPOSABLE) ×4 IMPLANT
HANDPIECE INTERPULSE COAX TIP (DISPOSABLE)
KIT BASIN OR (CUSTOM PROCEDURE TRAY) ×2 IMPLANT
KIT ROOM TURNOVER OR (KITS) ×2 IMPLANT
NS IRRIG 1000ML POUR BTL (IV SOLUTION) ×2 IMPLANT
PACK GENERAL/GYN (CUSTOM PROCEDURE TRAY) ×2 IMPLANT
PAD ARMBOARD 7.5X6 YLW CONV (MISCELLANEOUS) ×4 IMPLANT
SET HNDPC FAN SPRY TIP SCT (DISPOSABLE) IMPLANT
SPONGE GAUZE 4X4 12PLY (GAUZE/BANDAGES/DRESSINGS) ×2 IMPLANT
SWAB COLLECTION DEVICE MRSA (MISCELLANEOUS) IMPLANT
TOWEL OR 17X24 6PK STRL BLUE (TOWEL DISPOSABLE) ×2 IMPLANT
TOWEL OR 17X26 10 PK STRL BLUE (TOWEL DISPOSABLE) ×2 IMPLANT
TUBE ANAEROBIC SPECIMEN COL (MISCELLANEOUS) IMPLANT
UNDERPAD 30X30 INCONTINENT (UNDERPADS AND DIAPERS) ×2 IMPLANT

## 2012-09-16 NOTE — OR Nursing (Signed)
 Normal saline inserted into left breast

## 2012-09-16 NOTE — Transfer of Care (Signed)
Immediate Anesthesia Transfer of Care Note  Patient: Nancy Beard  Procedure(s) Performed: Procedure(s): Left Breast Evacuation of Hematoma (Left)  Patient Location: PACU  Anesthesia Type:General  Level of Consciousness: awake, alert , oriented and patient cooperative  Airway & Oxygen Therapy: Patient Spontanous Breathing and Patient connected to nasal cannula oxygen  Post-op Assessment: Report given to PACU RN, Post -op Vital signs reviewed and stable and Patient moving all extremities  Post vital signs: Reviewed and stable  Complications: No apparent anesthesia complications

## 2012-09-16 NOTE — Preoperative (Signed)
Beta Blockers   Reason not to administer Beta Blockers:Not Applicable 

## 2012-09-16 NOTE — Interval H&P Note (Signed)
History and Physical Interval Note:  09/16/2012 6:58 AM  Nancy Beard  has presented today for surgery, with the diagnosis of Left Breast Hematoma  The various methods of treatment have been discussed with the patient and family. After consideration of risks, benefits and other options for treatment, the patient has consented to  Procedure(s): Left Breast Evacuation of Hematoma (Left) as a surgical intervention .  The patient's history has been reviewed, patient examined, no change in status, stable for surgery.  I have reviewed the patient's chart and labs.  Questions were answered to the patient's satisfaction.     SANGER,Vilas Edgerly

## 2012-09-16 NOTE — Telephone Encounter (Signed)
Husband of pt called to ask about path report.  Dr. Abbey Chatters contacted (in clinic today) and stated pt is "in-patient" at the hospital.  Related that the lymph node was negative and he will discuss the pathology when he make rounds in the morning.

## 2012-09-16 NOTE — Anesthesia Postprocedure Evaluation (Signed)
  Anesthesia Post Note  Patient: Nancy Beard  Procedure(s) Performed: Procedure(s) (LRB): Left Breast Evacuation of Hematoma (Left)  Anesthesia type: GA  Patient location: PACU  Post pain: Pain level controlled  Post assessment: Post-op Vital signs reviewed  Last Vitals:  Filed Vitals:   09/16/12 0830  BP: 122/72  Pulse: 85  Temp:   Resp: 12    Post vital signs: Reviewed  Level of consciousness: sedated  Complications: No apparent anesthesia complications

## 2012-09-16 NOTE — Progress Notes (Signed)
Report given to angel rn as caregiver 

## 2012-09-16 NOTE — Brief Op Note (Signed)
09/11/2012 - 09/16/2012  8:13 AM  PATIENT:  Nancy Beard  57 y.o. female  PRE-OPERATIVE DIAGNOSIS:  Left Breast Hematoma  POST-OPERATIVE DIAGNOSIS:  Left Breast Hematoma  PROCEDURE:  Procedure(s): Left Breast Evacuation of Hematoma (Left)  SURGEON:  Surgeon(s) and Role:    * Claire Sanger, DO - Primary  PHYSICIAN ASSISTANT: none  ASSISTANTS: none   ANESTHESIA:   general  EBL:  Total I/O In: 500 [I.V.:500] Out: 250 [Blood:250]  BLOOD ADMINISTERED:none  DRAINS: (1) Jackson-Pratt drain(s) with closed bulb suction in the left breast pocket   LOCAL MEDICATIONS USED:  NONE  SPECIMEN:  No Specimen  DISPOSITION OF SPECIMEN:  N/A  COUNTS:  YES  TOURNIQUET:  * No tourniquets in log *  DICTATION: .Dragon Dictation  PLAN OF CARE: back to unit  PATIENT DISPOSITION:  PACU - hemodynamically stable.   Delay start of Pharmacological VTE agent (>24hrs) due to surgical blood loss or risk of bleeding: no

## 2012-09-16 NOTE — Anesthesia Preprocedure Evaluation (Signed)
Anesthesia Evaluation  Patient identified by MRN, date of birth, ID band Patient awake    Reviewed: Allergy & Precautions, H&P , Patient's Chart, lab work & pertinent test results, reviewed documented beta blocker date and time   History of Anesthesia Complications Negative for: history of anesthetic complications  Airway Mallampati: II TM Distance: >3 FB Neck ROM: full    Dental no notable dental hx.    Pulmonary neg pulmonary ROS, pneumonia -, resolved,  breath sounds clear to auscultation  Pulmonary exam normal       Cardiovascular Exercise Tolerance: Good negative cardio ROS  Rhythm:regular Rate:Normal     Neuro/Psych negative neurological ROS  negative psych ROS   GI/Hepatic negative GI ROS, Neg liver ROS,   Endo/Other  negative endocrine ROSHypothyroidism   Renal/GU negative Renal ROS     Musculoskeletal   Abdominal   Peds  Hematology negative hematology ROS (+)   Anesthesia Other Findings Thyroid disease     Depression        Sprue     Diverticulosis        Hyperlipidemia     Breast cancer   right    Hypothyroidism     Pneumonia    Reproductive/Obstetrics negative OB ROS                           Anesthesia Physical Anesthesia Plan  ASA: II  Anesthesia Plan: General LMA   Post-op Pain Management:    Induction:   Airway Management Planned:   Additional Equipment:   Intra-op Plan:   Post-operative Plan:   Informed Consent: I have reviewed the patients History and Physical, chart, labs and discussed the procedure including the risks, benefits and alternatives for the proposed anesthesia with the patient or authorized representative who has indicated his/her understanding and acceptance.   Dental Advisory Given  Plan Discussed with: CRNA, Surgeon and Anesthesiologist  Anesthesia Plan Comments:         Anesthesia Quick Evaluation

## 2012-09-16 NOTE — Progress Notes (Signed)
Day of Surgery  Subjective: Went to OR this AM for evacuation of hematoma.  Looks good in the PACU.  Objective: Vital signs in last 24 hours: Temp:  [98.2 F (36.8 C)-98.8 F (37.1 C)] 98.2 F (36.8 C) (03/04 0857) Pulse Rate:  [79-99] 80 (03/04 0856) Resp:  [11-17] 12 (03/04 0856) BP: (122-166)/(61-81) 125/72 mmHg (03/04 0855) SpO2:  [94 %-100 %] 99 % (03/04 0856) Last BM Date: 09/15/12  Intake/Output from previous day: 03/03 0701 - 03/04 0700 In: 3177.2 [P.O.:480; I.V.:2397.2; IV Piggyback:300] Out: 180 [Drains:180] Intake/Output this shift: Total I/O In: 500 [I.V.:500] Out: 275 [Drains:25; Blood:250]  PE: General- In NAD Chest-both flaps are flat  Lab Results:   Recent Labs  09/15/12 0920 09/15/12 2035  WBC 8.0 7.9  HGB 10.1* 9.4*  HCT 30.3* 27.8*  PLT 278 274   BMET  Recent Labs  09/15/12 2035  NA 138  K 3.6  CL 103  CO2 26  GLUCOSE 110*  BUN 4*  CREATININE 0.54  CALCIUM 9.0   PT/INR  Recent Labs  09/15/12 0920  LABPROT 12.8  INR 0.97   Comprehensive Metabolic Panel:    Component Value Date/Time   NA 138 09/15/2012 2035   NA 146* 07/02/2012 0825   K 3.6 09/15/2012 2035   K 3.3* 07/02/2012 0825   CL 103 09/15/2012 2035   CL 104 07/02/2012 0825   CO2 26 09/15/2012 2035   CO2 26 07/02/2012 0825   BUN 4* 09/15/2012 2035   BUN 13.0 07/02/2012 0825   CREATININE 0.54 09/15/2012 2035   CREATININE 0.7 07/02/2012 0825   GLUCOSE 110* 09/15/2012 2035   GLUCOSE 102* 07/02/2012 0825   CALCIUM 9.0 09/15/2012 2035   CALCIUM 9.0 07/02/2012 0825   AST 32 09/15/2012 2035   AST 28 07/02/2012 0825   ALT 24 09/15/2012 2035   ALT 32 07/02/2012 0825   ALKPHOS 142* 09/15/2012 2035   ALKPHOS 150 07/02/2012 0825   BILITOT 0.8 09/15/2012 2035   BILITOT 0.45 07/02/2012 0825   PROT 6.4 09/15/2012 2035   PROT 7.3 07/02/2012 0825   ALBUMIN 2.8* 09/15/2012 2035   ALBUMIN 3.7 07/02/2012 0825     Studies/Results: No results found.  Anti-infectives: Anti-infectives   Start      Dose/Rate Route Frequency Ordered Stop   09/16/12 0748  polymyxin B 500,000 Units, bacitracin 50,000 Units in sodium chloride irrigation 0.9 % 500 mL irrigation  Status:  Discontinued       As needed 09/16/12 0748 09/16/12 0819   09/16/12 0600  ceFAZolin (ANCEF) IVPB 2 g/50 mL premix     2 g 100 mL/hr over 30 Minutes Intravenous On call to O.R. 09/15/12 1442 09/16/12 0734   09/11/12 1800  Ampicillin-Sulbactam (UNASYN) 3 g in sodium chloride 0.9 % 100 mL IVPB     3 g 100 mL/hr over 60 Minutes Intravenous Every 6 hours 09/11/12 1739     09/11/12 1521  polymyxin B 500,000 Units, bacitracin 50,000 Units in sodium chloride irrigation 0.9 % 500 mL irrigation  Status:  Discontinued       As needed 09/11/12 1521 09/11/12 1558   09/11/12 1109  ceFAZolin (ANCEF) IVPB 2 g/50 mL premix     2 g 100 mL/hr over 30 Minutes Intravenous On call to O.R. 09/11/12 1110 09/11/12 1330   09/11/12 1107  ceFAZolin (ANCEF) IVPB 2 g/50 mL premix  Status:  Discontinued     2 g 100 mL/hr over 30 Minutes Intravenous On call to O.R.  09/11/12 1107 09/11/12 1111   09/11/12 1106  ceFAZolin (ANCEF) IVPB 2 g/50 mL premix  Status:  Discontinued     2 g 100 mL/hr over 30 Minutes Intravenous On call to O.R. 09/11/12 1106 09/11/12 1111   09/11/12 0600  ceFAZolin (ANCEF) IVPB 2 g/50 mL premix  Status:  Discontinued     2 g 100 mL/hr over 30 Minutes Intravenous On call to O.R. 09/10/12 1429 09/11/12 1111      Assessment Principal Problem: 1.  Right breast cancer s/p bilateral mastectomies, right axillary SLNBx, bilateral tissue expander placement-s/p evacuation of hematoma.  2.  ABL anemia-hgb 9.4    LOS: 5 days   Plan: Advance along path as tolerated.   ROSENBOWER,TODD J 09/16/2012

## 2012-09-16 NOTE — Anesthesia Procedure Notes (Signed)
Procedure Name: LMA Insertion Date/Time: 09/16/2012 7:32 AM Performed by: Jerilee Hoh Pre-anesthesia Checklist: Patient identified, Emergency Drugs available, Suction available and Patient being monitored Patient Re-evaluated:Patient Re-evaluated prior to inductionOxygen Delivery Method: Circle system utilized Preoxygenation: Pre-oxygenation with 100% oxygen Intubation Type: IV induction LMA: LMA inserted LMA Size: 4.0 Number of attempts: 1 Placement Confirmation: positive ETCO2 and breath sounds checked- equal and bilateral Tube secured with: Tape Dental Injury: Teeth and Oropharynx as per pre-operative assessment

## 2012-09-16 NOTE — Op Note (Signed)
Breast Reduction Op note:  Claire Sanger  ASSISTANT: None  PREOPERATIVE DIAGNOSIS Left breast hematoma after mastectomy and reconstruction  POSTOPERATIVE DIAGNOSIS Left breast hematoma after mastectomy and reconstruction  PROCEDURES Evacuation of left breast hematoma  COMPLICATIONS: None.  DRAINS: JPs x2  INDICATIONS FOR PROCEDURE The patient is a 57 y.o. year-old female who underwent bilateral mastectomies and immediate reconstruction.  She developed a left breast hematoma that was not able to be evacuated from the drain.      CONSENT Informed consent was obtained directly from the patient. The risks, benefits and alternatives were fully discussed. Specific risks including but not limited to bleeding, infection, hematoma, seroma, scarring, pain, nipple necrosis, asymmetry, poor cosmetic results, and need for further surgery were discussed. The patient had ample opportunity to have her questions answered to her satisfaction.  DESCRIPTION OF PROCEDURE  Patient was brought into the operating room and placed in a supine position.  SCDs were placed and appropriate padding was performed.  The patient underwent general anesthesia and the chest was prepped and draped in a sterile fashion.  The previous drain was removed. A timeout was performed and all information was confirmed to be correct.  The previous left breast incision was opened with a #15 blade.  The hematoma ~300 cc was evacuated.  The pocket was irrigated with normal saline and antibiotic solution.  There was no active bleeding.  Hemostasis was achieved.  A new drain was placed and secured with a 3-0 silk. The deep tissues were approximated with 3-0 vicryl sutures followed by 4-0 vicryl and the skin edges were re-approximated with 5-0 Monocryl sutures.  The patient tolerated the procedure well. The patient was allowed to wake up from anesthesia and taken to the recovery room in satisfactory condition.

## 2012-09-17 ENCOUNTER — Encounter (HOSPITAL_COMMUNITY): Payer: Self-pay | Admitting: Plastic Surgery

## 2012-09-17 LAB — CBC WITH DIFFERENTIAL/PLATELET
Basophils Absolute: 0 10*3/uL (ref 0.0–0.1)
Basophils Relative: 0 % (ref 0–1)
Eosinophils Absolute: 0.3 10*3/uL (ref 0.0–0.7)
Eosinophils Relative: 3 % (ref 0–5)
HCT: 29.3 % — ABNORMAL LOW (ref 36.0–46.0)
Hemoglobin: 10 g/dL — ABNORMAL LOW (ref 12.0–15.0)
Lymphocytes Relative: 22 % (ref 12–46)
Lymphs Abs: 2 10*3/uL (ref 0.7–4.0)
MCH: 26.2 pg (ref 26.0–34.0)
MCHC: 34.1 g/dL (ref 30.0–36.0)
MCV: 76.9 fL — ABNORMAL LOW (ref 78.0–100.0)
Monocytes Absolute: 1.4 10*3/uL — ABNORMAL HIGH (ref 0.1–1.0)
Monocytes Relative: 15 % — ABNORMAL HIGH (ref 3–12)
Neutro Abs: 5.5 10*3/uL (ref 1.7–7.7)
Neutrophils Relative %: 59 % (ref 43–77)
Platelets: 323 10*3/uL (ref 150–400)
RBC: 3.81 MIL/uL — ABNORMAL LOW (ref 3.87–5.11)
RDW: 18.3 % — ABNORMAL HIGH (ref 11.5–15.5)
WBC: 9.2 10*3/uL (ref 4.0–10.5)

## 2012-09-17 MED ORDER — DIAZEPAM 2 MG PO TABS: 2.0000 mg | ORAL_TABLET | Freq: Two times a day (BID) | ORAL | Status: DC | PRN

## 2012-09-17 MED ORDER — HYDROCODONE-ACETAMINOPHEN 5-325 MG PO TABS: 1.0000 | ORAL_TABLET | Freq: Four times a day (QID) | ORAL | Status: DC | PRN

## 2012-09-17 MED ORDER — DSS 100 MG PO CAPS: 100.0000 mg | ORAL_CAPSULE | Freq: Three times a day (TID) | ORAL | Status: DC

## 2012-09-17 NOTE — Discharge Summary (Signed)
Physician Discharge Summary  Patient ID: ENAS WINCHEL MRN: [id number] DOB/AGE: Jul 17, 1955 57 y.o.  Admit date: 09/11/2012 Discharge date: 09/17/2012  Admission Diagnoses:  Discharge Diagnoses:  Principal Problem:   Cancer of lower-inner quadrant of female breast   Discharged Condition: good  Hospital Course: The patient was taken to the OR for bilateral mastectomies with reconstruction.  She did well for a day but had a significant drop in her hemaglobin.  She was transfused and responded with an appropriate increase in her levels.  She then presented with a hematoma on the left breast side.  She underwent evacuation in the OR.  There was no sign of infection and she she felt significantly better after surgery.  She was walking, eating, voiding and tolerating her pain well at the time of discharge.  Consults: None  Significant Diagnostic Studies: labs: CBC  Treatments: surgery: bilateral mastectomy with reconstruction and evacuation of left breast hematoma  Discharge Exam: Blood pressure 137/69, pulse 70, temperature 98.1 F (36.7 C), temperature source Oral, resp. rate 17, height 5\' 6"  (1.676 m), weight 66.633 kg (146 lb 14.4 oz), last menstrual period 09/11/2012, SpO2 99.00%. General appearance: alert, cooperative and no distress Incision/Wound:all flaps warm and dry with good color and capillary refill. No sign of hematoma.  Disposition: Final discharge disposition not confirmed   Future Appointments [facility] Department Dept Phone   09/22/2012 12:00 PM Krista Blue [facility] MEDICAL ONCOLOGY [phone]   09/22/2012 12:30 PM Victorino December, MD [facility] MEDICAL ONCOLOGY [phone]   10/27/2012 1:45 PM Stacie Glaze, MD [facility] [phone]       Medication List    TAKE these medications       dexamethasone 4 MG tablet  Commonly known as:  DECADRON  Take 2 tablets by mouth once a day on the day after  chemotherapy and then take 2 tablets two times a day for 2 days. Take with food.     diazepam 2 MG tablet  Commonly known as:  VALIUM  Take 1 tablet (2 mg total) by mouth every 12 (twelve) hours as needed.     DSS 100 MG Caps  Take 100 mg by mouth 3 (three) times daily.     HYDROcodone-acetaminophen 5-325 MG per tablet  Commonly known as:  NORCO/VICODIN  Take 1 tablet by mouth every 6 (six) hours as needed.     levothyroxine 25 MCG tablet  Commonly known as:  SYNTHROID, LEVOTHROID  Take 25 mcg by mouth daily.     lidocaine-prilocaine cream  Commonly known as:  EMLA  Apply topically as needed.     liothyronine 25 MCG tablet  Commonly known as:  CYTOMEL  Take 12.5 mcg by mouth daily.     LORazepam 0.5 MG tablet  Commonly known as:  ATIVAN  Take 1 tablet (0.5 mg total) by mouth every 6 (six) hours as needed (Nausea or vomiting).     ondansetron 8 MG tablet  Commonly known as:  ZOFRAN  Take 1 tablet (8 mg total) by mouth 2 (two) times daily as needed. Take two times a day as needed for nausea or vomiting starting on the third day after chemotherapy.     oxyCODONE 5 MG immediate release tablet  Commonly known as:  Oxy IR/ROXICODONE  Take 1-2 tablets (5-10 mg total) by mouth every 4 (four) hours as needed.     prochlorperazine 10 MG tablet  Commonly known as:  COMPAZINE  Take  1 tablet (10 mg total) by mouth every 6 (six) hours as needed (Nausea or vomiting).     prochlorperazine 25 MG suppository  Commonly known as:  COMPAZINE  Place 1 suppository (25 mg total) rectally every 12 (twelve) hours as needed for nausea.     TYLENOL 325 MG tablet  Generic drug:  acetaminophen  Take 650 mg by mouth as needed.           Follow-up Information   Follow up with [facility], DO In 2 days.   Contact information:   [address] [place] [address] [phone]       Follow up with Adolph Pollack, MD In 2 weeks.   Contact information:   [address] [place] [address] [phone]       Signed: Wayland Denis 09/17/2012, 7:10 AM

## 2012-09-17 NOTE — Progress Notes (Signed)
1 Day Post-Op  Subjective: Feels much better since hematoma evacuated.  Objective: Vital signs in last 24 hours: Temp:  [98 F (36.7 C)-98.6 F (37 C)] 98.1 F (36.7 C) (03/05 0628) Pulse Rate:  [70-99] 70 (03/05 0628) Resp:  [11-18] 17 (03/05 0628) BP: (108-153)/(64-86) 137/69 mmHg (03/05 0628) SpO2:  [95 %-100 %] 99 % (03/05 0628) Last BM Date: 09/16/12  Intake/Output from previous day: 03/04 0701 - 03/05 0700 In: 5846.6 [I.V.:5439.6; IV Piggyback:400] Out: 338 [Drains:88; Blood:250] Intake/Output this shift:    PE: General- In NAD Chest-both flaps are flat  Lab Results:   Recent Labs  09/15/12 2035 09/17/12 0535  WBC 7.9 9.2  HGB 9.4* 10.0*  HCT 27.8* 29.3*  PLT 274 323   BMET  Recent Labs  09/15/12 2035  NA 138  K 3.6  CL 103  CO2 26  GLUCOSE 110*  BUN 4*  CREATININE 0.54  CALCIUM 9.0   PT/INR  Recent Labs  09/15/12 0920  LABPROT 12.8  INR 0.97   Comprehensive Metabolic Panel:    Component Value Date/Time   NA 138 09/15/2012 2035   NA 146* 07/02/2012 0825   K 3.6 09/15/2012 2035   K 3.3* 07/02/2012 0825   CL 103 09/15/2012 2035   CL 104 07/02/2012 0825   CO2 26 09/15/2012 2035   CO2 26 07/02/2012 0825   BUN 4* 09/15/2012 2035   BUN 13.0 07/02/2012 0825   CREATININE 0.54 09/15/2012 2035   CREATININE 0.7 07/02/2012 0825   GLUCOSE 110* 09/15/2012 2035   GLUCOSE 102* 07/02/2012 0825   CALCIUM 9.0 09/15/2012 2035   CALCIUM 9.0 07/02/2012 0825   AST 32 09/15/2012 2035   AST 28 07/02/2012 0825   ALT 24 09/15/2012 2035   ALT 32 07/02/2012 0825   ALKPHOS 142* 09/15/2012 2035   ALKPHOS 150 07/02/2012 0825   BILITOT 0.8 09/15/2012 2035   BILITOT 0.45 07/02/2012 0825   PROT 6.4 09/15/2012 2035   PROT 7.3 07/02/2012 0825   ALBUMIN 2.8* 09/15/2012 2035   ALBUMIN 3.7 07/02/2012 0825     Studies/Results: No results found.  Anti-infectives: Anti-infectives   Start     Dose/Rate Route Frequency Ordered Stop   09/16/12 0748  polymyxin B 500,000 Units,  bacitracin 50,000 Units in sodium chloride irrigation 0.9 % 500 mL irrigation  Status:  Discontinued       As needed 09/16/12 0748 09/16/12 0819   09/16/12 0600  ceFAZolin (ANCEF) IVPB 2 g/50 mL premix     2 g 100 mL/hr over 30 Minutes Intravenous On call to O.R. 09/15/12 1442 09/16/12 0734   09/11/12 1800  Ampicillin-Sulbactam (UNASYN) 3 g in sodium chloride 0.9 % 100 mL IVPB     3 g 100 mL/hr over 60 Minutes Intravenous Every 6 hours 09/11/12 1739     09/11/12 1521  polymyxin B 500,000 Units, bacitracin 50,000 Units in sodium chloride irrigation 0.9 % 500 mL irrigation  Status:  Discontinued       As needed 09/11/12 1521 09/11/12 1558   09/11/12 1109  ceFAZolin (ANCEF) IVPB 2 g/50 mL premix     2 g 100 mL/hr over 30 Minutes Intravenous On call to O.R. 09/11/12 1110 09/11/12 1330   09/11/12 1107  ceFAZolin (ANCEF) IVPB 2 g/50 mL premix  Status:  Discontinued     2 g 100 mL/hr over 30 Minutes Intravenous On call to O.R. 09/11/12 1107 09/11/12 1111   09/11/12 1106  ceFAZolin (ANCEF) IVPB 2 g/50 mL premix  Status:  Discontinued     2 g 100 mL/hr over 30 Minutes Intravenous On call to O.R. 09/11/12 1106 09/11/12 1111   09/11/12 0600  ceFAZolin (ANCEF) IVPB 2 g/50 mL premix  Status:  Discontinued     2 g 100 mL/hr over 30 Minutes Intravenous On call to O.R. 09/10/12 1429 09/11/12 1111      Assessment Principal Problem: 1.  Right breast cancer s/p bilateral mastectomies, right axillary SLNBx, bilateral tissue expander placement-s/p evacuation of hematoma.  2.  ABL anemia-hgb 10    LOS: 6 days   Plan: Discharge today.   ROSENBOWER,TODD J 09/17/2012

## 2012-09-17 NOTE — Progress Notes (Signed)
Patient discharged to home with instructions given to patient and family, verbalized and demonstrated understanding.

## 2012-09-22 ENCOUNTER — Ambulatory Visit: Payer: Private Health Insurance - Indemnity

## 2012-09-22 ENCOUNTER — Other Ambulatory Visit: Payer: Private Health Insurance - Indemnity | Admitting: Lab

## 2012-09-22 ENCOUNTER — Telehealth: Payer: Self-pay | Admitting: Oncology

## 2012-09-22 ENCOUNTER — Encounter: Payer: Self-pay | Admitting: Oncology

## 2012-09-22 ENCOUNTER — Other Ambulatory Visit (HOSPITAL_BASED_OUTPATIENT_CLINIC_OR_DEPARTMENT_OTHER): Payer: Private Health Insurance - Indemnity | Admitting: Lab

## 2012-09-22 ENCOUNTER — Ambulatory Visit: Payer: Private Health Insurance - Indemnity | Admitting: Adult Health

## 2012-09-22 ENCOUNTER — Ambulatory Visit (HOSPITAL_BASED_OUTPATIENT_CLINIC_OR_DEPARTMENT_OTHER): Payer: Private Health Insurance - Indemnity | Admitting: Oncology

## 2012-09-22 VITALS — BP 136/82 | HR 103 | Temp 97.5°F | Resp 20 | Ht 66.0 in | Wt 140.4 lb

## 2012-09-22 DIAGNOSIS — C50319 Malignant neoplasm of lower-inner quadrant of unspecified female breast: Secondary | ICD-10-CM

## 2012-09-22 DIAGNOSIS — Z171 Estrogen receptor negative status [ER-]: Secondary | ICD-10-CM

## 2012-09-22 DIAGNOSIS — C50311 Malignant neoplasm of lower-inner quadrant of right female breast: Secondary | ICD-10-CM

## 2012-09-22 LAB — CBC WITH DIFFERENTIAL/PLATELET
BASO%: 0.7 % (ref 0.0–2.0)
Basophils Absolute: 0.1 10*3/uL (ref 0.0–0.1)
EOS%: 4.6 % (ref 0.0–7.0)
Eosinophils Absolute: 0.3 10*3/uL (ref 0.0–0.5)
HCT: 32.5 % — ABNORMAL LOW (ref 34.8–46.6)
HGB: 10.6 g/dL — ABNORMAL LOW (ref 11.6–15.9)
LYMPH%: 26.4 % (ref 14.0–49.7)
MCH: 25.9 pg (ref 25.1–34.0)
MCHC: 32.6 g/dL (ref 31.5–36.0)
MCV: 79.3 fL — ABNORMAL LOW (ref 79.5–101.0)
MONO#: 0.9 10*3/uL (ref 0.1–0.9)
MONO%: 11.4 % (ref 0.0–14.0)
NEUT#: 4.2 10*3/uL (ref 1.5–6.5)
NEUT%: 56.9 % (ref 38.4–76.8)
Platelets: 508 10*3/uL — ABNORMAL HIGH (ref 145–400)
RBC: 4.1 10*6/uL (ref 3.70–5.45)
RDW: 17.8 % — ABNORMAL HIGH (ref 11.2–14.5)
WBC: 7.4 10*3/uL (ref 3.9–10.3)
lymph#: 2 10*3/uL (ref 0.9–3.3)
nRBC: 0 % (ref 0–0)

## 2012-09-22 LAB — COMPREHENSIVE METABOLIC PANEL (CC13)
ALT: 145 U/L — ABNORMAL HIGH (ref 0–55)
AST: 75 U/L — ABNORMAL HIGH (ref 5–34)
Albumin: 3 g/dL — ABNORMAL LOW (ref 3.5–5.0)
Alkaline Phosphatase: 347 U/L — ABNORMAL HIGH (ref 40–150)
BUN: 10.8 mg/dL (ref 7.0–26.0)
CO2: 24 mEq/L (ref 22–29)
Calcium: 9.4 mg/dL (ref 8.4–10.4)
Chloride: 106 mEq/L (ref 98–107)
Creatinine: 0.7 mg/dL (ref 0.6–1.1)
Glucose: 129 mg/dl — ABNORMAL HIGH (ref 70–99)
Potassium: 3.9 mEq/L (ref 3.5–5.1)
Sodium: 140 mEq/L (ref 136–145)
Total Bilirubin: 0.46 mg/dL (ref 0.20–1.20)
Total Protein: 7.1 g/dL (ref 6.4–8.3)

## 2012-09-22 MED ORDER — DSS 100 MG PO CAPS: 100.0000 mg | ORAL_CAPSULE | Freq: Three times a day (TID) | ORAL | Status: DC

## 2012-09-22 NOTE — Telephone Encounter (Signed)
gv pt appt schedule for March and April.  °

## 2012-09-22 NOTE — Patient Instructions (Addendum)
Proceed with chemo beginning on 10/15/12  We need port placement.

## 2012-09-22 NOTE — Progress Notes (Signed)
OFFICE PROGRESS NOTE  CC  Nancy Mew, MD [address] [place] [address] Dr. Lurline Hare  Dr. Avel Peace  DIAGNOSIS: 57 year old female with new diagnosis of stage II a breast cancer that is triple negative     STAGE:  Cancer of lower-inner quadrant of female breast  Primary site: Breast (Right)  Staging method: AJCC 7th Edition  Clinical: Stage IIA (T2, N0, cM0)  Summary: Stage IA (T2, N0, cM0)   PRIOR THERAPY: #1Patient's last mammogram was about 5 years ago. Recently she underwent a screening mammogram that showed a spiculated mass in the lower inner quadrant of the right breast. She had ultrasound performed that showed irregular mass at the 5:00 position 3 cm from the nipple measuring 1.1 cm. She had a needle core biopsy performed on 06/19/2012 the biopsy showed invasive ductal carcinoma with ductal carcinoma in situ grade 3 tumor was ER negative PR negative HER-2/neu negative. Ki-67 was elevated at 34%. She went on to have MRI of the breasts performed on 06/24/2012 the MRI showed in the middle third of the lower inner quadrant of the right breast a 1.5 x 1.3 x 1.9 cm irregular enhancing mass. No abnormal enhancement was seen in the left breast no enlarged axillary or internal mammary adenopathy was detected.   #2 patient is now status post bilateral mastectomies. On her right mastectomy she was found to have a 2.3 cm invasive ductal carcinoma 01 lymph nodes were positive for metastatic disease and this is T2 N0) stage II. Tumor was grade 3 ER 2% PR negative HER-2/neu negative with a Ki-67 at 34%. Left mastectomy only showed intraductal papilloma node atypia or malignancy was found.  #3 patient is now being considered for adjuvant chemotherapy since her tumor is essentially triple negative and is a stage II. We discussed the rationale for adjuvant chemotherapy. She will receive Adriamycin Cytoxan every 2 weeks for a total of 4 cycles with G-CSF support.  Once she completes this then she will receive Taxol and carboplatinum every week for a total of 12 weeks. I did discuss with her use of antiestrogen therapy such as an aromatase inhibitor since her tumor is 2% ER positive. I discussed the rationale for this.  CURRENT THERAPY:patient will proceed with adjuvant chemotherapy consisting of Adriamycin Cytoxan on 10/15/2012.  INTERVAL HISTORY: Nancy Beard 57 y.o. female returns for followup visit after her surgery. Postoperatively she looks great without any problems. She denies any fevers chills night sweats headaches shortness of breath or chest pains or palpitations. She still has her drains in place. She denies having any myalgias and arthralgias. Remainder of the 10 point review of systems is negative.  MEDICAL HISTORY: Past Medical History  Diagnosis Date  . Thyroid disease   . Depression   . Sprue   . Diverticulosis   . Hyperlipidemia   . Breast cancer     right  . Hypothyroidism   . Pneumonia     ALLERGIES:  is allergic to gluten meal.  MEDICATIONS:  Current Outpatient Prescriptions  Medication Sig Dispense Refill  . diazepam (VALIUM) 2 MG tablet Take 1 tablet (2 mg total) by mouth every 12 (twelve) hours as needed.  30 tablet  0  . docusate sodium 100 MG CAPS Take 100 mg by mouth 3 (three) times daily.  10 capsule  0  . HYDROcodone-acetaminophen (NORCO/VICODIN) 5-325 MG per tablet Take 1 tablet by mouth every 6 (six) hours as needed.  30 tablet  0  . levothyroxine (  SYNTHROID, LEVOTHROID) 25 MCG tablet Take 25 mcg by mouth daily.      [name] liothyronine (CYTOMEL) 25 MCG tablet Take 12.5 mcg by mouth daily.      [name] LORazepam (ATIVAN) 0.5 MG tablet Take 1 tablet (0.5 mg total) by mouth every 6 (six) hours as needed (Nausea or vomiting).  30 tablet  0  . acetaminophen (TYLENOL) 325 MG tablet Take 650 mg by mouth as needed.      [name] dexamethasone (DECADRON) 4 MG tablet Take 2 tablets by mouth once a day on the day after chemotherapy and  then take 2 tablets two times a day for 2 days. Take with food.  30 tablet  1  . lidocaine-prilocaine (EMLA) cream Apply topically as needed.  30 g  8  . ondansetron (ZOFRAN) 8 MG tablet Take 1 tablet (8 mg total) by mouth 2 (two) times daily as needed. Take two times a day as needed for nausea or vomiting starting on the third day after chemotherapy.  30 tablet  1  . oxyCODONE (OXY IR/ROXICODONE) 5 MG immediate release tablet Take 1-2 tablets (5-10 mg total) by mouth every 4 (four) hours as needed.  50 tablet  0  . prochlorperazine (COMPAZINE) 10 MG tablet Take 1 tablet (10 mg total) by mouth every 6 (six) hours as needed (Nausea or vomiting).  30 tablet  1  . prochlorperazine (COMPAZINE) 25 MG suppository Place 1 suppository (25 mg total) rectally every 12 (twelve) hours as needed for nausea.  12 suppository  3   No current facility-administered medications for this visit.    SURGICAL HISTORY:  Past Surgical History  Procedure Laterality Date  . Esophagogastroduodenoscopy  2007    sprue  . Colonoscopy  2006  . Cryoblation of cervix    . Breast lumpectomy    . Total mastectomy Bilateral 09/11/2012    Procedure: bilateral MASTECTOMY;  Surgeon: Adolph Pollack, MD;  Location: [facility] OR;  Service: General;  Laterality: Bilateral;  . Axillary sentinel node biopsy Right 09/11/2012    Procedure: AXILLARY SENTINEL lymph NODE  BIOPSY;  Surgeon: Adolph Pollack, MD;  Location: [facility] OR;  Service: General;  Laterality: Right;  right nuclear medicine injection 12:30   . Breast reconstruction with placement of tissue expander and flex hd (acellular hydrated dermis) Bilateral 09/11/2012    Procedure: BREAST RECONSTRUCTION WITH PLACEMENT OF TISSUE EXPANDER AND FLEX HD (ACELLULAR HYDRATED DERMIS) ADM;  Surgeon: Wayland Denis, DO;  Location: [facility] OR;  Service: Plastics;  Laterality: Bilateral;  . Incision and drainage of wound Left 09/16/2012    Procedure: Left Breast Evacuation of Hematoma;  Surgeon: Wayland Denis,  DO;  Location: [facility] OR;  Service: Plastics;  Laterality: Left;    REVIEW OF SYSTEMS:  Pertinent items are noted in HPI.   HEALTH MAINTENANCE:  PHYSICAL EXAMINATION: Blood pressure 136/82, pulse 103, temperature 97.5 F (36.4 C), temperature source Oral, resp. rate 20, height 5\' 6"  (1.676 m), weight 140 lb 6.4 oz (63.685 kg), last menstrual period 09/11/2012. Body mass index is 22.67 kg/(m^2). ECOG PERFORMANCE STATUS: 1 - Symptomatic but completely ambulatory   General appearance: alert, cooperative and appears stated age Resp: clear to auscultation bilaterally Cardio: regular rate and rhythm GI: soft, non-tender; bowel sounds normal; no masses,  no organomegaly Extremities: extremities normal, atraumatic, no cyanosis or edema Neurologic: Grossly normal   LABORATORY DATA: Lab Results  Component Value Date   WBC 7.4 09/22/2012   HGB 10.6* 09/22/2012   HCT 32.5* 09/22/2012  MCV 79.3* 09/22/2012   PLT 508* 09/22/2012      Chemistry      Component Value Date/Time   NA 138 09/15/2012 2035   NA 146* 07/02/2012 0825   K 3.6 09/15/2012 2035   K 3.3* 07/02/2012 0825   CL 103 09/15/2012 2035   CL 104 07/02/2012 0825   CO2 26 09/15/2012 2035   CO2 26 07/02/2012 0825   BUN 4* 09/15/2012 2035   BUN 13.0 07/02/2012 0825   CREATININE 0.54 09/15/2012 2035   CREATININE 0.7 07/02/2012 0825      Component Value Date/Time   CALCIUM 9.0 09/15/2012 2035   CALCIUM 9.0 07/02/2012 0825   ALKPHOS 142* 09/15/2012 2035   ALKPHOS 150 07/02/2012 0825   AST 32 09/15/2012 2035   AST 28 07/02/2012 0825   ALT 24 09/15/2012 2035   ALT 32 07/02/2012 0825   BILITOT 0.8 09/15/2012 2035   BILITOT 0.45 07/02/2012 0825     ADDITIONAL INFORMATION: 3. CHROMOGENIC IN-SITU HYBRIDIZATION Interpretation HER-2/NEU BY CISH - NO AMPLIFICATION OF HER-2 DETECTED. THE RATIO OF HER-2: CEP 17 SIGNALS WAS 1.52. Reference range: Ratio: HER2:CEP17 < 1.8 - gene amplification not observed Ratio: HER2:CEP 17 1.8-2.2 - equivocal  result Ratio: HER2:CEP17 > 2.2 - gene amplification observed Pecola Leisure MD Pathologist, Electronic Signature ( Signed 09/22/2012) 3. PROGNOSTIC INDICATORS - ACIS Results IMMUNOHISTOCHEMICAL AND MORPHOMETRIC ANALYSIS BY THE AUTOMATED CELLULAR IMAGING SYSTEM (ACIS) Estrogen Receptor (Negative, <1%): 2%, POSITIVE, WEAK STAINING INTENSITY Progesterone Receptor (Negative, <1%): 0%, NEGATIVE COMMENT: The negative hormone receptor study in this case has an internal positive control. All controls stained appropriately Pecola Leisure MD Pathologist, Electronic Signature ( Signed 09/22/2012) 1 of 4 FINAL for Comacho, Katelynd A [phone]) FINAL DIAGNOSIS Diagnosis 1. Lymph node, sentinel, biopsy, Right axilla - ONE LYMPH NODE, NEGATIVE FOR TUMOR (0/1). 2. Breast, simple mastectomy, Left - BENIGN BREAST TISSUE WITH INTRADUCTAL PAPILLOMA, SEE COMMENT. - NEGATIVE FOR ATYPIA OR MALIGNANCY. - SURGICAL MARGINS, NEGATIVE FOR ATYPIA OR MALIGNANCY. - MICROCALCIFICATIONS IDENTIFIED. 3. Breast, simple mastectomy, Right - INVASIVE DUCTAL CARCINOMA, GRADE III, WITH SPINDLE CELL DIFFERENTIATION (METAPLASTIC CARCINOMA) (2.3CM), SEE COMMENT. - NO LYMPHOVASCULAR INVASION IDENTIFIED. - INVASIVE TUMOR IS 1.5 CM FROM NEAREST MARGIN (DEEP). - DUCTAL CARCINOMA IN SITU, GRADE III, WITH COMEDONECROSIS. - SEE TUMOR SYNOPTIC TEMPLATE BELOW. Microscopic Comment 2. Although there was no mass grossly identified, there are definitive morphologic features of intraductal papilloma with associated fibrocystic change present. In addition, there are fibrocystic changes with usual ductal hyperplasia in representative sections not involved by papilloma. Finally, foci of sclerosing adenosis and microcalcifications in benign ducts and lobules are present. The surgical resection margin(s) of the specimen were inked and microscopically evaluated. 3. BREAST, INVASIVE TUMOR, WITH LYMPH NODE SAMPLING Specimen, including  laterality: Right breast. Procedure: Simple mastectomy. Grade: III of III. Tubule formation: 3. Nuclear pleomorphism: 3. Mitotic: 2. Tumor size (gross measurement): 2.3 cm. Margins: Invasive, distance to closest margin: 1.5 cm. In-situ, distance to closest margin: 1.5 cm (deep). If margin positive, focally or broadly: N/A. Lymphovascular invasion: Absent. Ductal carcinoma in situ: Present. Grade: III of III. Extensive intraductal component: Absent. Lobular neoplasia: Absent. Tumor focality: Unifocal. Treatment effect: None. If present, treatment effect in breast tissue, lymph nodes or both: N/A. Extent of tumor: Skin and Nipple: Grossly negative. Skeletal muscle: N/A. Lymph nodes: # examined: 1. Lymph nodes with metastasis: 0. Breast prognostic profile: Estrogen receptor: Repeated; previous study demonstrates 0% positivity ([id number]). Progesterone receptor: Repeated; previous study demonstrates 0% positivity ([id number]). HER-2/neu: Repeated; previous  study demonstrated no amplification (1.55) ([id number]). 2 of 4 FINAL for CHANDNI, GAGAN A [phone]) Microscopic Comment(continued) Ki-67: Not repeated; previous study demonstrates 34% proliferation rate [phone]). Non-neoplastic breast: Intraductal papilloma with fibrocystic change and usual ductal hyperplasia, fibrocystic change with usual ductal hyperplasia, microcalcifications, and previous biopsy site. TNM: pT2, pN0, pMX. Comments: Although there are definitive features of high grade invasive ductal carcinoma identified, there are multiple foci where the carcinoma assumes a spindle cell morphology / spindle cell differentiation. 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 There are no heterologous elements identified. (CRR:eps 09/15/12)  RADIOGRAPHIC STUDIES:  Chest 2 View  09/08/2012  *RADIOLOGY REPORT*  Clinical Data: Right-sided  breast  CHEST - 2 VIEW  Comparison: Chest x-ray 12/06/2009.  Findings: Lung volumes are normal.  No consolidative airspace disease.  No pleural effusions.  No pneumothorax.  No pulmonary nodule or mass noted.  Pulmonary vasculature and the cardiomediastinal silhouette are within normal limits.  Mild bilateral apical pleuroparenchymal thickening most compatible with chronic scarring, unchanged compared to the prior examination.  IMPRESSION: 1. No radiographic evidence of acute cardiopulmonary disease.   Original Report Authenticated By: Trudie Reed, M.D.    Nm Sentinel Node Inj-no Rpt (breast)  09/11/2012  CLINICAL DATA: Invasive right breast cancer   Sulfur colloid was injected intradermally by the nuclear medicine  technologist for breast cancer sentinel node localization.      ASSESSMENT: 57 year old female with  #1 invasive ductal carcinoma of the right breast status post bilateral mastectomies for a 2.3 cm node negative essentially triple negative disease on the right. Without any evidence of malignancy on the left. Patient's a candidate for adjuvant chemotherapy. We discussed Adriamycin Cytoxan dose dense followed by weekly Taxol Carbo. Since her tumor was 2% estrogen receptor positive we will utilize an aromatase inhibitor for 5 years. She understands the rationale for this.  #2 she will need a Port-A-Cath placed as well as an echocardiogram and chemotherapy teaching class. We will set this up. Hopefully we can get her started on chemotherapy in the beginning of April.   PLAN:   #1 Port-A-Cath placement  #2 echocardiogram  #3 chemotherapy teaching class  #4 begin chemotherapy on 10/15/2012 to   All questions were answered. The patient knows to call the clinic with any problems, questions or concerns. We can certainly see the patient much sooner if necessary.  I spent 40 minutes counseling the patient face to face. The total time spent in the appointment was 30  minutes.    Drue Second, MD Medical/Oncology [facility] [phone] (beeper) [phone] (Office)  09/22/2012, 1:03 PM

## 2012-09-23 ENCOUNTER — Other Ambulatory Visit: Payer: Self-pay | Admitting: Oncology

## 2012-09-23 ENCOUNTER — Ambulatory Visit: Payer: Private Health Insurance - Indemnity

## 2012-09-23 DIAGNOSIS — Z9013 Acquired absence of bilateral breasts and nipples: Secondary | ICD-10-CM | POA: Insufficient documentation

## 2012-09-24 ENCOUNTER — Telehealth (INDEPENDENT_AMBULATORY_CARE_PROVIDER_SITE_OTHER): Payer: Self-pay

## 2012-09-24 NOTE — Telephone Encounter (Signed)
LMOV to call.  Dr. Abbey Chatters wrote orders for her [facility] placement (per Dr. Welton Flakes), and surgery scheduling would be contacting her soon.

## 2012-09-25 ENCOUNTER — Encounter (INDEPENDENT_AMBULATORY_CARE_PROVIDER_SITE_OTHER): Payer: Self-pay | Admitting: General Surgery

## 2012-09-25 ENCOUNTER — Other Ambulatory Visit (INDEPENDENT_AMBULATORY_CARE_PROVIDER_SITE_OTHER): Payer: Self-pay | Admitting: General Surgery

## 2012-09-25 NOTE — Progress Notes (Signed)
Patient ID: Nancy Beard, female   DOB: Sep 04, 1955, 57 y.o.   MRN: [id number] I spoke with her. She is doing well. I plan on putting a Port-A-Cath in her 10/09/2012. We discussed this.

## 2012-09-26 ENCOUNTER — Encounter: Payer: Self-pay | Admitting: Oncology

## 2012-09-26 NOTE — Progress Notes (Signed)
Faxed disability form to [employer] @ [phone] °

## 2012-10-03 ENCOUNTER — Encounter (HOSPITAL_BASED_OUTPATIENT_CLINIC_OR_DEPARTMENT_OTHER): Payer: Self-pay | Admitting: *Deleted

## 2012-10-06 ENCOUNTER — Encounter (HOSPITAL_BASED_OUTPATIENT_CLINIC_OR_DEPARTMENT_OTHER)
Admission: RE | Admit: 2012-10-06 | Discharge: 2012-10-06 | Disposition: A | Discharge status: Home / Self Care | Payer: Private Health Insurance - Indemnity | Source: Ambulatory Visit | Attending: General Surgery | Admitting: General Surgery

## 2012-10-06 LAB — CBC WITH DIFFERENTIAL/PLATELET
Basophils Absolute: 0.1 10*3/uL (ref 0.0–0.1)
Basophils Relative: 1 % (ref 0–1)
Eosinophils Absolute: 1 10*3/uL — ABNORMAL HIGH (ref 0.0–0.7)
Eosinophils Relative: 13 % — ABNORMAL HIGH (ref 0–5)
HCT: 36.4 % (ref 36.0–46.0)
Hemoglobin: 12.4 g/dL (ref 12.0–15.0)
Lymphocytes Relative: 31 % (ref 12–46)
Lymphs Abs: 2.4 10*3/uL (ref 0.7–4.0)
MCH: 27 pg (ref 26.0–34.0)
MCHC: 34.1 g/dL (ref 30.0–36.0)
MCV: 79.3 fL (ref 78.0–100.0)
Monocytes Absolute: 0.8 10*3/uL (ref 0.1–1.0)
Monocytes Relative: 10 % (ref 3–12)
Neutro Abs: 3.6 10*3/uL (ref 1.7–7.7)
Neutrophils Relative %: 45 % (ref 43–77)
Platelets: 478 10*3/uL — ABNORMAL HIGH (ref 150–400)
RBC: 4.59 MIL/uL (ref 3.87–5.11)
RDW: 17 % — ABNORMAL HIGH (ref 11.5–15.5)
WBC: 7.9 10*3/uL (ref 4.0–10.5)

## 2012-10-08 ENCOUNTER — Encounter: Payer: Self-pay | Admitting: Oncology

## 2012-10-08 NOTE — Progress Notes (Signed)
Faxed fmla form to [employer] @ [phone] °

## 2012-10-09 ENCOUNTER — Ambulatory Visit (HOSPITAL_COMMUNITY): Payer: Private Health Insurance - Indemnity

## 2012-10-09 ENCOUNTER — Encounter (HOSPITAL_BASED_OUTPATIENT_CLINIC_OR_DEPARTMENT_OTHER): Payer: Self-pay | Admitting: Anesthesiology

## 2012-10-09 ENCOUNTER — Encounter (HOSPITAL_BASED_OUTPATIENT_CLINIC_OR_DEPARTMENT_OTHER)
Admission: RE | Disposition: A | Discharge status: Home / Self Care | Payer: Self-pay | Source: Ambulatory Visit | Attending: General Surgery

## 2012-10-09 ENCOUNTER — Encounter (HOSPITAL_BASED_OUTPATIENT_CLINIC_OR_DEPARTMENT_OTHER): Payer: Self-pay | Admitting: *Deleted

## 2012-10-09 ENCOUNTER — Ambulatory Visit (HOSPITAL_BASED_OUTPATIENT_CLINIC_OR_DEPARTMENT_OTHER): Payer: Private Health Insurance - Indemnity | Admitting: Anesthesiology

## 2012-10-09 ENCOUNTER — Ambulatory Visit (HOSPITAL_BASED_OUTPATIENT_CLINIC_OR_DEPARTMENT_OTHER)
Admission: RE | Admit: 2012-10-09 | Discharge: 2012-10-09 | Disposition: A | Discharge status: Home / Self Care | Payer: Private Health Insurance - Indemnity | Source: Ambulatory Visit | Attending: General Surgery | Admitting: General Surgery

## 2012-10-09 DIAGNOSIS — Z9010 Acquired absence of unspecified breast and nipple: Secondary | ICD-10-CM | POA: Insufficient documentation

## 2012-10-09 DIAGNOSIS — F329 Major depressive disorder, single episode, unspecified: Secondary | ICD-10-CM | POA: Insufficient documentation

## 2012-10-09 DIAGNOSIS — E785 Hyperlipidemia, unspecified: Secondary | ICD-10-CM | POA: Insufficient documentation

## 2012-10-09 DIAGNOSIS — F3289 Other specified depressive episodes: Secondary | ICD-10-CM | POA: Insufficient documentation

## 2012-10-09 DIAGNOSIS — E039 Hypothyroidism, unspecified: Secondary | ICD-10-CM | POA: Insufficient documentation

## 2012-10-09 DIAGNOSIS — C50919 Malignant neoplasm of unspecified site of unspecified female breast: Secondary | ICD-10-CM

## 2012-10-09 DIAGNOSIS — Z853 Personal history of malignant neoplasm of breast: Secondary | ICD-10-CM | POA: Insufficient documentation

## 2012-10-09 DIAGNOSIS — T888XXS Other specified complications of surgical and medical care, not elsewhere classified, sequela: Secondary | ICD-10-CM

## 2012-10-09 HISTORY — PX: PORTACATH PLACEMENT: SHX2246

## 2012-10-09 SURGERY — INSERTION, TUNNELED CENTRAL VENOUS DEVICE, WITH PORT
Anesthesia: Choice | Site: Chest | Laterality: Right | Wound class: Clean

## 2012-10-09 MED ORDER — HEPARIN (PORCINE) IN NACL 2-0.9 UNIT/ML-% IJ SOLN
INTRAMUSCULAR | Status: DC | PRN
  Administered 2012-10-09: 1 via INTRAVENOUS

## 2012-10-09 MED ORDER — OXYCODONE HCL 5 MG/5ML PO SOLN: 5.0000 mg | Freq: Once | ORAL | Status: DC | PRN

## 2012-10-09 MED ORDER — FENTANYL CITRATE 0.05 MG/ML IJ SOLN
INTRAMUSCULAR | Status: DC | PRN
  Administered 2012-10-09 (×2): 50 ug via INTRAVENOUS

## 2012-10-09 MED ORDER — PROPOFOL 10 MG/ML IV EMUL
INTRAVENOUS | Status: DC | PRN
  Administered 2012-10-09: 50 ug/kg/min via INTRAVENOUS

## 2012-10-09 MED ORDER — ACETAMINOPHEN 650 MG RE SUPP: 650.0000 mg | RECTAL | Status: DC | PRN

## 2012-10-09 MED ORDER — ONDANSETRON HCL 4 MG/2ML IJ SOLN
INTRAMUSCULAR | Status: DC | PRN
  Administered 2012-10-09: 4 mg via INTRAVENOUS

## 2012-10-09 MED ORDER — LIDOCAINE HCL (PF) 1 % IJ SOLN
INTRAMUSCULAR | Status: DC | PRN
  Administered 2012-10-09: 15 mL

## 2012-10-09 MED ORDER — FENTANYL CITRATE 0.05 MG/ML IJ SOLN: 50.0000 ug | INTRAMUSCULAR | Status: DC | PRN

## 2012-10-09 MED ORDER — MIDAZOLAM HCL 5 MG/5ML IJ SOLN
INTRAMUSCULAR | Status: DC | PRN
  Administered 2012-10-09: 2 mg via INTRAVENOUS

## 2012-10-09 MED ORDER — OXYCODONE HCL 5 MG PO TABS: 5.0000 mg | ORAL_TABLET | Freq: Once | ORAL | Status: DC | PRN

## 2012-10-09 MED ORDER — SODIUM CHLORIDE 0.9 % IJ SOLN: 3.0000 mL | INTRAMUSCULAR | Status: DC | PRN

## 2012-10-09 MED ORDER — LACTATED RINGERS IV SOLN
INTRAVENOUS | Status: DC | PRN
  Administered 2012-10-09: 10:00:00 via INTRAVENOUS

## 2012-10-09 MED ORDER — MIDAZOLAM HCL 2 MG/2ML IJ SOLN: 1.0000 mg | INTRAMUSCULAR | Status: DC | PRN

## 2012-10-09 MED ORDER — LIDOCAINE HCL (CARDIAC) 20 MG/ML IV SOLN
INTRAVENOUS | Status: DC | PRN
  Administered 2012-10-09: 50 mg via INTRAVENOUS

## 2012-10-09 MED ORDER — MEPERIDINE HCL 25 MG/ML IJ SOLN: 6.2500 mg | INTRAMUSCULAR | Status: DC | PRN

## 2012-10-09 MED ORDER — HEPARIN SOD (PORK) LOCK FLUSH 100 UNIT/ML IV SOLN
INTRAVENOUS | Status: DC | PRN
  Administered 2012-10-09: 500 [IU]

## 2012-10-09 MED ORDER — LACTATED RINGERS IV SOLN: INTRAVENOUS | Status: DC

## 2012-10-09 MED ORDER — OXYCODONE HCL 5 MG PO TABS: 5.0000 mg | ORAL_TABLET | ORAL | Status: DC | PRN

## 2012-10-09 MED ORDER — ACETAMINOPHEN 325 MG PO TABS: 650.0000 mg | ORAL_TABLET | ORAL | Status: DC | PRN

## 2012-10-09 MED ORDER — CEFAZOLIN SODIUM-DEXTROSE 2-3 GM-% IV SOLR
INTRAVENOUS | Status: DC | PRN
  Administered 2012-10-09: 2 g via INTRAVENOUS

## 2012-10-09 MED ORDER — HYDROMORPHONE HCL PF 1 MG/ML IJ SOLN: 0.2500 mg | INTRAMUSCULAR | Status: DC | PRN

## 2012-10-09 MED ORDER — CEFAZOLIN SODIUM-DEXTROSE 2-3 GM-% IV SOLR: 2.0000 g | INTRAVENOUS | Status: DC

## 2012-10-09 MED ORDER — ONDANSETRON HCL 4 MG/2ML IJ SOLN: 4.0000 mg | Freq: Four times a day (QID) | INTRAMUSCULAR | Status: DC | PRN

## 2012-10-09 MED ORDER — ONDANSETRON HCL 4 MG/2ML IJ SOLN: 4.0000 mg | Freq: Once | INTRAMUSCULAR | Status: DC | PRN

## 2012-10-09 SURGICAL SUPPLY — 57 items
APL SKNCLS STERI-STRIP NONHPOA (GAUZE/BANDAGES/DRESSINGS) ×1
BAG DECANTER FOR FLEXI CONT (MISCELLANEOUS) ×2 IMPLANT
BENZOIN TINCTURE PRP APPL 2/3 (GAUZE/BANDAGES/DRESSINGS) ×2 IMPLANT
BLADE SURG 11 STRL SS (BLADE) ×2 IMPLANT
BLADE SURG 15 STRL LF DISP TIS (BLADE) IMPLANT
BLADE SURG 15 STRL SS (BLADE)
BLADE SURG ROTATE 9660 (MISCELLANEOUS) IMPLANT
CANISTER SUCTION 1200CC (MISCELLANEOUS) IMPLANT
CHLORAPREP W/TINT 26ML (MISCELLANEOUS) ×2 IMPLANT
CLEANER CAUTERY TIP 5X5 PAD (MISCELLANEOUS) ×1 IMPLANT
CLOTH BEACON ORANGE TIMEOUT ST (SAFETY) ×2 IMPLANT
COVER MAYO STAND STRL (DRAPES) ×2 IMPLANT
COVER PROBE 5X48 (MISCELLANEOUS) ×2
COVER TABLE BACK 60X90 (DRAPES) ×2 IMPLANT
DECANTER SPIKE VIAL GLASS SM (MISCELLANEOUS) IMPLANT
DRAPE C-ARM 42X72 X-RAY (DRAPES) ×2 IMPLANT
DRAPE LAPAROTOMY TRNSV 102X78 (DRAPE) ×2 IMPLANT
DRAPE UTILITY XL STRL (DRAPES) ×2 IMPLANT
DRSG TEGADERM 2-3/8X2-3/4 SM (GAUZE/BANDAGES/DRESSINGS) ×2 IMPLANT
DRSG TEGADERM 4X4.75 (GAUZE/BANDAGES/DRESSINGS) ×2 IMPLANT
ELECT REM PT RETURN 9FT ADLT (ELECTROSURGICAL) ×2
ELECTRODE REM PT RTRN 9FT ADLT (ELECTROSURGICAL) ×1 IMPLANT
GAUZE SPONGE 4X4 12PLY STRL LF (GAUZE/BANDAGES/DRESSINGS) ×3 IMPLANT
GAUZE SPONGE 4X4 16PLY XRAY LF (GAUZE/BANDAGES/DRESSINGS) IMPLANT
GLOVE BIOGEL M 7.0 STRL (GLOVE) ×1 IMPLANT
GLOVE BIOGEL PI IND STRL 7.5 (GLOVE) IMPLANT
GLOVE BIOGEL PI IND STRL 8.5 (GLOVE) ×1 IMPLANT
GLOVE BIOGEL PI INDICATOR 7.5 (GLOVE) ×1
GLOVE BIOGEL PI INDICATOR 8.5 (GLOVE) ×1
GLOVE ECLIPSE 8.0 STRL XLNG CF (GLOVE) ×2 IMPLANT
GOWN PREVENTION PLUS XLARGE (GOWN DISPOSABLE) ×2 IMPLANT
GOWN PREVENTION PLUS XXLARGE (GOWN DISPOSABLE) ×2 IMPLANT
IV CATH PLACEMENT UNIT 16 GA (IV SOLUTION) IMPLANT
IV HEPARIN 1000UNITS/500ML (IV SOLUTION) ×2 IMPLANT
IV KIT MINILOC 20X1 SAFETY (NEEDLE) IMPLANT
KIT CVR 48X5XPRB PLUP LF (MISCELLANEOUS) IMPLANT
KIT PORT POWER 8FR ISP CVUE (Catheter) ×1 IMPLANT
NDL HYPO 25X1 1.5 SAFETY (NEEDLE) ×1 IMPLANT
NEEDLE HYPO 22GX1.5 SAFETY (NEEDLE) IMPLANT
NEEDLE HYPO 25X1 1.5 SAFETY (NEEDLE) ×2 IMPLANT
PACK BASIN DAY SURGERY FS (CUSTOM PROCEDURE TRAY) ×2 IMPLANT
PAD CLEANER CAUTERY TIP 5X5 (MISCELLANEOUS) ×1
PENCIL BUTTON HOLSTER BLD 10FT (ELECTRODE) ×2 IMPLANT
SET SHEATH INTRODUCER 10FR (MISCELLANEOUS) IMPLANT
SLEEVE SCD COMPRESS KNEE MED (MISCELLANEOUS) IMPLANT
SPONGE GAUZE 2X2 8PLY STRL LF (GAUZE/BANDAGES/DRESSINGS) ×4 IMPLANT
STRIP CLOSURE SKIN 1/2X4 (GAUZE/BANDAGES/DRESSINGS) ×2 IMPLANT
SUT MNCRL AB 4-0 PS2 18 (SUTURE) ×2 IMPLANT
SUT VIC AB 2-0 SH 18 (SUTURE) ×2 IMPLANT
SUT VIC AB 3-0 SH 27 (SUTURE) ×2
SUT VIC AB 3-0 SH 27X BRD (SUTURE) ×1 IMPLANT
SYR 5ML LUER SLIP (SYRINGE) ×2 IMPLANT
SYR CONTROL 10ML LL (SYRINGE) ×2 IMPLANT
TOWEL OR 17X24 6PK STRL BLUE (TOWEL DISPOSABLE) ×4 IMPLANT
TOWEL OR NON WOVEN STRL DISP B (DISPOSABLE) ×2 IMPLANT
TUBE CONNECTING 20X1/4 (TUBING) IMPLANT
YANKAUER SUCT BULB TIP NO VENT (SUCTIONS) IMPLANT

## 2012-10-09 NOTE — Transfer of Care (Signed)
Immediate Anesthesia Transfer of Care Note  Patient: Nancy Beard  Procedure(s) Performed: Procedure(s) with comments: US GUIDED INSERTION PORT-A-CATH (Right) - Right Subclavian Vein  Patient Location: PACU  Anesthesia Type:MAC  Level of Consciousness: awake, oriented and patient cooperative  Airway & Oxygen Therapy: Patient Spontanous Breathing and Patient connected to face mask oxygen  Post-op Assessment: Report given to PACU RN and Post -op Vital signs reviewed and stable  Post vital signs: Reviewed and stable  Complications: No apparent anesthesia complications

## 2012-10-09 NOTE — H&P (Signed)
Nancy Beard is an 57 y.o. female.   Chief Complaint:   Here for port-a-cath insertion HPI: She is s/p bilateral mastectomies for breast cancer and needs long-term venous access for chemotherapy.  Past Medical History  Diagnosis Date  . Thyroid disease   . Depression   . Sprue   . Diverticulosis   . Hyperlipidemia   . Breast cancer     right  . Hypothyroidism   . Pneumonia     Past Surgical History  Procedure Laterality Date  . Esophagogastroduodenoscopy  2007    sprue  . Colonoscopy  2006  . Cryoblation of cervix    . Breast lumpectomy    . Total mastectomy Bilateral 09/11/2012    Procedure: bilateral MASTECTOMY;  Surgeon: Nancy Pollack, MD;  Location[facility] OR;  Service: General;  Laterality: Bilateral;  . Axillary sentinel node biopsy Right 09/11/2012    Procedure: AXILLARY SENTINEL lymph NODE  BIOPSY;  Surgeon: Nancy Pollack, MD;  Location: [facility] OR;  Service: General;  Laterality: Right;  right nuclear medicine injection 12:30   . Breast reconstruction with placement of tissue expander and flex hd (acellular hydrated dermis) Bilateral 09/11/2012    Procedure: BREAST RECONSTRUCTION WITH PLACEMENT OF TISSUE EXPANDER AND FLEX HD (ACELLULAR HYDRATED DERMIS) ADM;  Surgeon: Nancy Denis, DO;  Location: [facility] OR;  Service: Plastics;  Laterality: Bilateral;  . Incision and drainage of wound Left 09/16/2012    Procedure: Left Breast Evacuation of Hematoma;  Surgeon: Nancy Denis, DO;  Location: [facility] OR;  Service: Plastics;  Laterality: Left;    Family History  Problem Relation Age of Onset  . Breast cancer Paternal Aunt     dx in her 52s  . Cancer Paternal Aunt     breast  . Lymphoma Paternal Uncle   . Breast cancer Paternal Grandmother     dx >50  . Cancer Paternal Grandmother     breast  . Heart attack Paternal Grandfather   . Breast cancer Other     2 maternal great aunts with breast cancer >50  . Cancer Other     breast   Social History:  reports that she has never  smoked. She does not have any smokeless tobacco history on file. She reports that she does not drink alcohol or use illicit drugs.  Allergies:  Allergies  Allergen Reactions  . Gluten Meal     Medications Prior to Admission  Medication Sig Dispense Refill  . acetaminophen (TYLENOL) 325 MG tablet Take 650 mg by mouth as needed.      . diazepam (VALIUM) 2 MG tablet Take 1 tablet (2 mg total) by mouth every 12 (twelve) hours as needed.  30 tablet  0  . docusate sodium (COLACE) 100 MG capsule TAKE ONE CAPSULE BY MOUTH EVERY 8 HOURS AS NEEDED  10 capsule  0  . HYDROcodone-acetaminophen (NORCO/VICODIN) 5-325 MG per tablet Take 1 tablet by mouth every 6 (six) hours as needed.  30 tablet  0  . levothyroxine (SYNTHROID, LEVOTHROID) 25 MCG tablet Take 25 mcg by mouth daily.      [name] liothyronine (CYTOMEL) 25 MCG tablet Take 12.5 mcg by mouth daily.      . Multiple Vitamin (MULTIVITAMIN) tablet Take 1 tablet by mouth daily.      [name] oxyCODONE (OXY IR/ROXICODONE) 5 MG immediate release tablet Take 1-2 tablets (5-10 mg total) by mouth every 4 (four) hours as needed.  50 tablet  0  . dexamethasone (DECADRON) 4 MG tablet  Take 2 tablets by mouth once a day on the day after chemotherapy and then take 2 tablets two times a day for 2 days. Take with food.  30 tablet  1  . lidocaine-prilocaine (EMLA) cream Apply topically as needed.  30 g  8  . LORazepam (ATIVAN) 0.5 MG tablet Take 1 tablet (0.5 mg total) by mouth every 6 (six) hours as needed (Nausea or vomiting).  30 tablet  0  . ondansetron (ZOFRAN) 8 MG tablet Take 1 tablet (8 mg total) by mouth 2 (two) times daily as needed. Take two times a day as needed for nausea or vomiting starting on the third day after chemotherapy.  30 tablet  1  . prochlorperazine (COMPAZINE) 10 MG tablet Take 1 tablet (10 mg total) by mouth every 6 (six) hours as needed (Nausea or vomiting).  30 tablet  1  . prochlorperazine (COMPAZINE) 25 MG suppository Place 1 suppository (25 mg  total) rectally every 12 (twelve) hours as needed for nausea.  12 suppository  3    No results found for this or any previous visit (from the past 48 hour(s)). No results found.  Review of Systems  Constitutional: Negative for fever and chills.  HENT: Negative for congestion.   Respiratory: Negative for cough.     Blood pressure 129/83, temperature 97.8 F (36.6 C), temperature source Oral, resp. rate 20, height 5\' 6"  (1.676 m), weight 139 lb 6 oz (63.22 kg), last menstrual period 09/11/2012, SpO2 99.00%. Physical Exam  Constitutional: She appears well-developed and well-nourished. No distress.  HENT:  Head: Normocephalic and atraumatic.  Neck: Neck supple.  Cardiovascular: Normal rate and regular rhythm.   Respiratory: Effort normal and breath sounds normal.  Bilateral chest wall scars with tissue expanders in.     Assessment/Plan Breast cancer  Plan: US guided port-a-cath insertion.  Procedure, risks, and after care have been discussed with her.  Nancy Beard J 10/09/2012, 9:46 AM

## 2012-10-09 NOTE — Op Note (Signed)
Preoperative diagnosis:  Breast Cancer  Postoperative diagnosis:  Same  Procedure: Ultrasound-guided Port-A-Cath insertion into right internal jugular vein under fluoroscopy.  Surgeon: Avel Peace M.D.  Anesthesia: Local (Xylocaine) with MAC.  Indication:  This is a 57 year old female with breast cancer who has had bilateral mastectomies.  She needs long term venous access for chemotherapy.  Technique: She was brought to the operating room, placed supine on the operating table, and intravenous sedation was given. A roll was placed under the back and the arms were tucked. Hair on the chest wall was clipped as was necessary. The upper chest wall and neck were sterilely prepped and draped.  Her head was rotated to the left. Using the ultrasound, the right internal jugular vein was identified. Local anesthetic was infiltrated in the skin and subcutaneous tissue just anterior to it. A 16-gauge needle was used to cannulate the right internal jugular vein under ultrasound guidance. A wire was then threaded through the needle into the internal jugular vein and down into the right heart under ultrasound and fluoroscopic guidance.  Local anesthetic was infiltrated into the right upper chest wall. A right upper chest wall incision was made and a pocket was created for the Portacath.  An incision was made around the wire in the neck. The catheter was then tunneled from the chest wall incision up through the neck incision.  A dilator- introducer complex was placed over the wire into the superior vena cava. The dilator and wire were then removed and the catheter was threaded through the peel-away sheath introducer into the right heart. The introducer was then peeled away and removed. Under fluoroscopic guidance, the tip of the catheter was then pulled back until it was at the junction of the superior vena cava and right atrium. The catheter was then connected to the port.  The port aspirated blood and flushed  easily.  The port was then anchored to the chest wall with 2-0 Vicryl suture. Concentrated heparin solution was then placed into the port. The port and catheter position were then verified using fluoroscopy. The subcutaneous tissue was then closed over the port with running 3-0 Vicryl suture. The skin incisions were then closed with 4-0 Monocryl subcuticular stitches. Steri-Strips and sterile dressings were applied.  The procedure was well-tolerated without any apparent complications. The patient was taken to the recovery room in satisfactory condition where a portable chest x-ray is pending.

## 2012-10-09 NOTE — Anesthesia Preprocedure Evaluation (Signed)
Anesthesia Evaluation  Patient identified by MRN, date of birth, ID band Patient awake    Reviewed: Allergy & Precautions, H&P , NPO status , Patient's Chart, lab work & pertinent test results  Airway Mallampati: II TM Distance: >3 FB Neck ROM: Full    Dental   Pulmonary          Cardiovascular     Neuro/Psych    GI/Hepatic   Endo/Other    Renal/GU      Musculoskeletal   Abdominal   Peds  Hematology   Anesthesia Other Findings   Reproductive/Obstetrics                           Anesthesia Physical Anesthesia Plan  ASA: II  Anesthesia Plan: MAC   Post-op Pain Management:    Induction: Intravenous  Airway Management Planned: Natural Airway  Additional Equipment:   Intra-op Plan:   Post-operative Plan:   Informed Consent: I have reviewed the patients History and Physical, chart, labs and discussed the procedure including the risks, benefits and alternatives for the proposed anesthesia with the patient or authorized representative who has indicated his/her understanding and acceptance.     Plan Discussed with: CRNA and Surgeon  Anesthesia Plan Comments:         Anesthesia Quick Evaluation

## 2012-10-09 NOTE — Anesthesia Postprocedure Evaluation (Signed)
Anesthesia Post Note  Patient: Nancy Beard  Procedure(s) Performed: Procedure(s) (LRB): US GUIDED INSERTION PORT-A-CATH (Right)  Anesthesia type: general  Patient location: PACU  Post pain: Pain level controlled  Post assessment: Patient's Cardiovascular Status Stable  Last Vitals:  Filed Vitals:   10/09/12 1200  BP: 137/82  Pulse: 79  Temp: 36.7 C  Resp: 18    Post vital signs: Reviewed and stable  Level of consciousness: sedated  Complications: No apparent anesthesia complications

## 2012-10-10 ENCOUNTER — Encounter (HOSPITAL_BASED_OUTPATIENT_CLINIC_OR_DEPARTMENT_OTHER): Payer: Self-pay | Admitting: General Surgery

## 2012-10-10 ENCOUNTER — Telehealth (INDEPENDENT_AMBULATORY_CARE_PROVIDER_SITE_OTHER): Payer: Self-pay | Admitting: *Deleted

## 2012-10-10 NOTE — Telephone Encounter (Signed)
Husband called to ask for a prescription for the Norco to be called in.  Husband states that the Percocet makes the patient "too out of it".  Norco 5-325mg  1 tab every 4-6 hours as needed for pain. #30 no refills. [facility] [phone]

## 2012-10-15 ENCOUNTER — Encounter: Payer: Self-pay | Admitting: Oncology

## 2012-10-15 ENCOUNTER — Ambulatory Visit (HOSPITAL_BASED_OUTPATIENT_CLINIC_OR_DEPARTMENT_OTHER): Payer: Private Health Insurance - Indemnity | Admitting: Oncology

## 2012-10-15 ENCOUNTER — Other Ambulatory Visit (HOSPITAL_BASED_OUTPATIENT_CLINIC_OR_DEPARTMENT_OTHER): Payer: Private Health Insurance - Indemnity | Admitting: Lab

## 2012-10-15 ENCOUNTER — Ambulatory Visit (HOSPITAL_BASED_OUTPATIENT_CLINIC_OR_DEPARTMENT_OTHER): Payer: Private Health Insurance - Indemnity

## 2012-10-15 VITALS — BP 131/84 | HR 84 | Temp 98.0°F | Resp 20 | Ht 66.0 in | Wt 140.5 lb

## 2012-10-15 DIAGNOSIS — C50319 Malignant neoplasm of lower-inner quadrant of unspecified female breast: Secondary | ICD-10-CM

## 2012-10-15 DIAGNOSIS — C50311 Malignant neoplasm of lower-inner quadrant of right female breast: Secondary | ICD-10-CM

## 2012-10-15 DIAGNOSIS — Z171 Estrogen receptor negative status [ER-]: Secondary | ICD-10-CM

## 2012-10-15 DIAGNOSIS — Z5111 Encounter for antineoplastic chemotherapy: Secondary | ICD-10-CM

## 2012-10-15 LAB — COMPREHENSIVE METABOLIC PANEL (CC13)
ALT: 41 U/L (ref 0–55)
AST: 39 U/L — ABNORMAL HIGH (ref 5–34)
Albumin: 3.8 g/dL (ref 3.5–5.0)
Alkaline Phosphatase: 206 U/L — ABNORMAL HIGH (ref 40–150)
BUN: 7.5 mg/dL (ref 7.0–26.0)
CO2: 24 mEq/L (ref 22–29)
Calcium: 9.3 mg/dL (ref 8.4–10.4)
Chloride: 105 mEq/L (ref 98–107)
Creatinine: 0.6 mg/dL (ref 0.6–1.1)
Glucose: 104 mg/dl — ABNORMAL HIGH (ref 70–99)
Potassium: 3.9 mEq/L (ref 3.5–5.1)
Sodium: 138 mEq/L (ref 136–145)
Total Bilirubin: 0.44 mg/dL (ref 0.20–1.20)
Total Protein: 7.3 g/dL (ref 6.4–8.3)

## 2012-10-15 LAB — CBC WITH DIFFERENTIAL/PLATELET
BASO%: 0.6 % (ref 0.0–2.0)
Basophils Absolute: 0 10*3/uL (ref 0.0–0.1)
EOS%: 12.4 % — ABNORMAL HIGH (ref 0.0–7.0)
Eosinophils Absolute: 0.8 10*3/uL — ABNORMAL HIGH (ref 0.0–0.5)
HCT: 36.9 % (ref 34.8–46.6)
HGB: 12 g/dL (ref 11.6–15.9)
LYMPH%: 35.5 % (ref 14.0–49.7)
MCH: 26 pg (ref 25.1–34.0)
MCHC: 32.5 g/dL (ref 31.5–36.0)
MCV: 80 fL (ref 79.5–101.0)
MONO#: 0.7 10*3/uL (ref 0.1–0.9)
MONO%: 10.5 % (ref 0.0–14.0)
NEUT#: 2.5 10*3/uL (ref 1.5–6.5)
NEUT%: 41 % (ref 38.4–76.8)
Platelets: 243 10*3/uL (ref 145–400)
RBC: 4.61 10*6/uL (ref 3.70–5.45)
RDW: 16.4 % — ABNORMAL HIGH (ref 11.2–14.5)
WBC: 6.2 10*3/uL (ref 3.9–10.3)
lymph#: 2.2 10*3/uL (ref 0.9–3.3)

## 2012-10-15 MED ORDER — SODIUM CHLORIDE 0.9 % IV SOLN
600.0000 mg/m2 | Freq: Once | INTRAVENOUS | Status: AC
  Administered 2012-10-15: 1080 mg via INTRAVENOUS
  Filled 2012-10-15: qty 54

## 2012-10-15 MED ORDER — DEXAMETHASONE SODIUM PHOSPHATE 4 MG/ML IJ SOLN
12.0000 mg | Freq: Once | INTRAMUSCULAR | Status: AC
  Administered 2012-10-15: 12 mg via INTRAVENOUS

## 2012-10-15 MED ORDER — SODIUM CHLORIDE 0.9 % IV SOLN
150.0000 mg | Freq: Once | INTRAVENOUS | Status: AC
  Administered 2012-10-15: 150 mg via INTRAVENOUS
  Filled 2012-10-15: qty 5

## 2012-10-15 MED ORDER — SODIUM CHLORIDE 0.9 % IJ SOLN
10.0000 mL | INTRAMUSCULAR | Status: DC | PRN
  Filled 2012-10-15: qty 10

## 2012-10-15 MED ORDER — PALONOSETRON HCL INJECTION 0.25 MG/5ML
0.2500 mg | Freq: Once | INTRAVENOUS | Status: AC
  Administered 2012-10-15: 0.25 mg via INTRAVENOUS

## 2012-10-15 MED ORDER — SODIUM CHLORIDE 0.9 % IV SOLN
Freq: Once | INTRAVENOUS | Status: AC
  Administered 2012-10-15: 13:00:00 via INTRAVENOUS

## 2012-10-15 MED ORDER — DOXORUBICIN HCL CHEMO IV INJECTION 2 MG/ML
60.0000 mg/m2 | Freq: Once | INTRAVENOUS | Status: AC
  Administered 2012-10-15: 108 mg via INTRAVENOUS
  Filled 2012-10-15: qty 54

## 2012-10-15 MED ORDER — HEPARIN SOD (PORK) LOCK FLUSH 100 UNIT/ML IV SOLN
500.0000 [IU] | Freq: Once | INTRAVENOUS | Status: DC | PRN
  Filled 2012-10-15: qty 5

## 2012-10-15 MED ORDER — LORAZEPAM 2 MG/ML IJ SOLN
0.5000 mg | Freq: Once | INTRAMUSCULAR | Status: AC
  Administered 2012-10-15: 0.5 mg via INTRAVENOUS

## 2012-10-15 NOTE — Patient Instructions (Addendum)
Proceed with chemotherapy today  I will see you back in 1 week for follow up

## 2012-10-15 NOTE — Progress Notes (Signed)
OFFICE PROGRESS NOTE  CC  Nancy Mew, MD [address] [place] [address] Dr. Lurline Hare  Dr. Avel Peace  DIAGNOSIS: 57 year old female with new diagnosis of stage II a breast cancer that is triple negative     STAGE:  Cancer of lower-inner quadrant of female breast  Primary site: Breast (Right)  Staging method: AJCC 7th Edition  Clinical: Stage IIA (T2, N0, cM0)  Summary: Stage IA (T2, N0, cM0)   PRIOR THERAPY: #1Patient's last mammogram was about 5 years ago. Recently she underwent a screening mammogram that showed a spiculated mass in the lower inner quadrant of the right breast. She had ultrasound performed that showed irregular mass at the 5:00 position 3 cm from the nipple measuring 1.1 cm. She had a needle core biopsy performed on 06/19/2012 the biopsy showed invasive ductal carcinoma with ductal carcinoma in situ grade 3 tumor was ER negative PR negative HER-2/neu negative. Ki-67 was elevated at 34%. She went on to have MRI of the breasts performed on 06/24/2012 the MRI showed in the middle third of the lower inner quadrant of the right breast a 1.5 x 1.3 x 1.9 cm irregular enhancing mass. No abnormal enhancement was seen in the left breast no enlarged axillary or internal mammary adenopathy was detected.   #2 patient is now status post bilateral mastectomies. On her right mastectomy she was found to have a 2.3 cm invasive ductal carcinoma 01 lymph nodes were positive for metastatic disease and this is T2 N0) stage II. Tumor was grade 3 ER 2% PR negative HER-2/neu negative with a Ki-67 at 34%. Left mastectomy only showed intraductal papilloma node atypia or malignancy was found.  #3 patient is now being considered for adjuvant chemotherapy since her tumor is essentially triple negative and is a stage II. We discussed the rationale for adjuvant chemotherapy. She will receive Adriamycin Cytoxan every 2 weeks for a total of 4 cycles with G-CSF support.  Once she completes this then she will receive Taxol and carboplatinum every week for a total of 12 weeks. I did discuss with her use of antiestrogen therapy such as an aromatase inhibitor since her tumor is 2% ER positive. I discussed the rationale for this.  CURRENT THERAPY:patient will proceed with adjuvant chemotherapy consisting of Adriamycin Cytoxan today, 10/15/2012  INTERVAL HISTORY: Nancy Beard 57 y.o. female returns for followup visit prior to start of her chemotherapy today.. She denies any fevers chills night sweats headaches shortness of breath or chest pains or palpitations. She still has her drains in place. She denies having any myalgias and arthralgias. Remainder of the 10 point review of systems is negative.  MEDICAL HISTORY: Past Medical History  Diagnosis Date  . Thyroid disease   . Depression   . Sprue   . Diverticulosis   . Hyperlipidemia   . Breast cancer     right  . Hypothyroidism   . Pneumonia     ALLERGIES:  is allergic to gluten meal.  MEDICATIONS:  Current Outpatient Prescriptions  Medication Sig Dispense Refill  . acetaminophen (TYLENOL) 325 MG tablet Take 650 mg by mouth as needed.      [name] dexamethasone (DECADRON) 4 MG tablet Take 2 tablets by mouth once a day on the day after chemotherapy and then take 2 tablets two times a day for 2 days. Take with food.  30 tablet  1  . diazepam (VALIUM) 2 MG tablet Take 1 tablet (2 mg total) by mouth every 12 (twelve) hours  as needed.  30 tablet  0  . docusate sodium (COLACE) 100 MG capsule TAKE ONE CAPSULE BY MOUTH EVERY 8 HOURS AS NEEDED  10 capsule  0  . HYDROcodone-acetaminophen (NORCO/VICODIN) 5-325 MG per tablet Take 1 tablet by mouth every 6 (six) hours as needed.  30 tablet  0  . levothyroxine (SYNTHROID, LEVOTHROID) 25 MCG tablet Take 25 mcg by mouth daily.      [name] lidocaine-prilocaine (EMLA) cream Apply topically as needed.  30 g  8  . liothyronine (CYTOMEL) 25 MCG tablet Take 12.5 mcg by mouth daily.       . Multiple Vitamin (MULTIVITAMIN) tablet Take 1 tablet by mouth daily.      . ondansetron (ZOFRAN) 8 MG tablet Take 1 tablet (8 mg total) by mouth 2 (two) times daily as needed. Take two times a day as needed for nausea or vomiting starting on the third day after chemotherapy.  30 tablet  1  . LORazepam (ATIVAN) 0.5 MG tablet Take 1 tablet (0.5 mg total) by mouth every 6 (six) hours as needed (Nausea or vomiting).  30 tablet  0  . oxyCODONE (OXY IR/ROXICODONE) 5 MG immediate release tablet Take 1-2 tablets (5-10 mg total) by mouth every 4 (four) hours as needed.  50 tablet  0  . prochlorperazine (COMPAZINE) 10 MG tablet Take 1 tablet (10 mg total) by mouth every 6 (six) hours as needed (Nausea or vomiting).  30 tablet  1  . prochlorperazine (COMPAZINE) 25 MG suppository Place 1 suppository (25 mg total) rectally every 12 (twelve) hours as needed for nausea.  12 suppository  3   No current facility-administered medications for this visit.    SURGICAL HISTORY:  Past Surgical History  Procedure Laterality Date  . Esophagogastroduodenoscopy  2007    sprue  . Colonoscopy  2006  . Cryoblation of cervix    . Breast lumpectomy    . Total mastectomy Bilateral 09/11/2012    Procedure: bilateral MASTECTOMY;  Surgeon: Adolph Pollack, MD;  Location: [facility] OR;  Service: General;  Laterality: Bilateral;  . Axillary sentinel node biopsy Right 09/11/2012    Procedure: AXILLARY SENTINEL lymph NODE  BIOPSY;  Surgeon: Adolph Pollack, MD;  Location: [facility] OR;  Service: General;  Laterality: Right;  right nuclear medicine injection 12:30   . Breast reconstruction with placement of tissue expander and flex hd (acellular hydrated dermis) Bilateral 09/11/2012    Procedure: BREAST RECONSTRUCTION WITH PLACEMENT OF TISSUE EXPANDER AND FLEX HD (ACELLULAR HYDRATED DERMIS) ADM;  Surgeon: Wayland Denis, DO;  Location: [facility] OR;  Service: Plastics;  Laterality: Bilateral;  . Incision and drainage of wound Left 09/16/2012     Procedure: Left Breast Evacuation of Hematoma;  Surgeon: Wayland Denis, DO;  Location: [facility] OR;  Service: Plastics;  Laterality: Left;  . Portacath placement Right 10/09/2012    Procedure: US GUIDED INSERTION PORT-A-CATH;  Surgeon: Adolph Pollack, MD;  Location: [facility];  Service: General;  Laterality: Right;  Right Subclavian Vein    REVIEW OF SYSTEMS:  Pertinent items are noted in HPI.   HEALTH MAINTENANCE:  PHYSICAL EXAMINATION: Blood pressure 131/84, pulse 84, temperature 98 F (36.7 C), temperature source Oral, resp. rate 20, height 5\' 6"  (1.676 m), weight 140 lb 8 oz (63.73 kg), last menstrual period 09/11/2012. Body mass index is 22.69 kg/(m^2). ECOG PERFORMANCE STATUS: 1 - Symptomatic but completely ambulatory   General appearance: alert, cooperative and appears stated age Resp: clear to auscultation bilaterally Cardio: regular  rate and rhythm GI: soft, non-tender; bowel sounds normal; no masses,  no organomegaly Extremities: extremities normal, atraumatic, no cyanosis or edema Neurologic: Grossly normal   LABORATORY DATA: Lab Results  Component Value Date   WBC 6.2 10/15/2012   HGB 12.0 10/15/2012   HCT 36.9 10/15/2012   MCV 80.0 10/15/2012   PLT 243 10/15/2012      Chemistry      Component Value Date/Time   NA 140 09/22/2012 1232   NA 138 09/15/2012 2035   K 3.9 09/22/2012 1232   K 3.6 09/15/2012 2035   CL 106 09/22/2012 1232   CL 103 09/15/2012 2035   CO2 24 09/22/2012 1232   CO2 26 09/15/2012 2035   BUN 10.8 09/22/2012 1232   BUN 4* 09/15/2012 2035   CREATININE 0.7 09/22/2012 1232   CREATININE 0.54 09/15/2012 2035      Component Value Date/Time   CALCIUM 9.4 09/22/2012 1232   CALCIUM 9.0 09/15/2012 2035   ALKPHOS 347* 09/22/2012 1232   ALKPHOS 142* 09/15/2012 2035   AST 75* 09/22/2012 1232   AST 32 09/15/2012 2035   ALT 145* 09/22/2012 1232   ALT 24 09/15/2012 2035   BILITOT 0.46 09/22/2012 1232   BILITOT 0.8 09/15/2012 2035     ADDITIONAL INFORMATION: 3. CHROMOGENIC  IN-SITU HYBRIDIZATION Interpretation HER-2/NEU BY CISH - NO AMPLIFICATION OF HER-2 DETECTED. THE RATIO OF HER-2: CEP 17 SIGNALS WAS 1.52. Reference range: Ratio: HER2:CEP17 < 1.8 - gene amplification not observed Ratio: HER2:CEP 17 1.8-2.2 - equivocal result Ratio: HER2:CEP17 > 2.2 - gene amplification observed Pecola Leisure MD Pathologist, Electronic Signature ( Signed 09/22/2012) 3. PROGNOSTIC INDICATORS - ACIS Results IMMUNOHISTOCHEMICAL AND MORPHOMETRIC ANALYSIS BY THE AUTOMATED CELLULAR IMAGING SYSTEM (ACIS) Estrogen Receptor (Negative, <1%): 2%, POSITIVE, WEAK STAINING INTENSITY Progesterone Receptor (Negative, <1%): 0%, NEGATIVE COMMENT: The negative hormone receptor study in this case has an internal positive control. All controls stained appropriately Pecola Leisure MD Pathologist, Electronic Signature ( Signed 09/22/2012) 1 of 4 FINAL for Molla, Munira A [phone]) FINAL DIAGNOSIS Diagnosis 1. Lymph node, sentinel, biopsy, Right axilla - ONE LYMPH NODE, NEGATIVE FOR TUMOR (0/1). 2. Breast, simple mastectomy, Left - BENIGN BREAST TISSUE WITH INTRADUCTAL PAPILLOMA, SEE COMMENT. - NEGATIVE FOR ATYPIA OR MALIGNANCY. - SURGICAL MARGINS, NEGATIVE FOR ATYPIA OR MALIGNANCY. - MICROCALCIFICATIONS IDENTIFIED. 3. Breast, simple mastectomy, Right - INVASIVE DUCTAL CARCINOMA, GRADE III, WITH SPINDLE CELL DIFFERENTIATION (METAPLASTIC CARCINOMA) (2.3CM), SEE COMMENT. - NO LYMPHOVASCULAR INVASION IDENTIFIED. - INVASIVE TUMOR IS 1.5 CM FROM NEAREST MARGIN (DEEP). - DUCTAL CARCINOMA IN SITU, GRADE III, WITH COMEDONECROSIS. - SEE TUMOR SYNOPTIC TEMPLATE BELOW. Microscopic Comment 2. Although there was no mass grossly identified, there are definitive morphologic features of intraductal papilloma with associated fibrocystic change present. In addition, there are fibrocystic changes with usual ductal hyperplasia in representative sections not involved by papilloma. Finally, foci of  sclerosing adenosis and microcalcifications in benign ducts and lobules are present. The surgical resection margin(s) of the specimen were inked and microscopically evaluated. 3. BREAST, INVASIVE TUMOR, WITH LYMPH NODE SAMPLING Specimen, including laterality: Right breast. Procedure: Simple mastectomy. Grade: III of III. Tubule formation: 3. Nuclear pleomorphism: 3. Mitotic: 2. Tumor size (gross measurement): 2.3 cm. Margins: Invasive, distance to closest margin: 1.5 cm. In-situ, distance to closest margin: 1.5 cm (deep). If margin positive, focally or broadly: N/A. Lymphovascular invasion: Absent. Ductal carcinoma in situ: Present. Grade: III of III. Extensive intraductal component: Absent. Lobular neoplasia: Absent. Tumor focality: Unifocal. Treatment effect: None. If present, treatment effect in  breast tissue, lymph nodes or both: N/A. Extent of tumor: Skin and Nipple: Grossly negative. Skeletal muscle: N/A. Lymph nodes: # examined: 1. Lymph nodes with metastasis: 0. Breast prognostic profile: Estrogen receptor: Repeated; previous study demonstrates 0% positivity ([id number]). Progesterone receptor: Repeated; previous study demonstrates 0% positivity ([id number]). HER-2/neu: Repeated; previous study demonstrated no amplification (1.55) ([id number]). 2 of 4 FINAL for DEMONI, PARMAR A [phone]) Microscopic Comment(continued) Ki-67: Not repeated; previous study demonstrates 34% proliferation rate [phone]). Non-neoplastic breast: Intraductal papilloma with fibrocystic change and usual ductal hyperplasia, fibrocystic change with usual ductal hyperplasia, microcalcifications, and previous biopsy site. TNM: pT2, pN0, pMX. Comments: Although there are definitive features of high grade invasive ductal carcinoma identified, there are multiple foci where the carcinoma assumes a spindle cell morphology / spindle cell differentiation. 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 There are no heterologous elements identified. (CRR:eps 09/15/12)  RADIOGRAPHIC STUDIES:  Chest 2 View  09/08/2012  *RADIOLOGY REPORT*  Clinical Data: Right-sided breast  CHEST - 2 VIEW  Comparison: Chest x-ray 12/06/2009.  Findings: Lung volumes are normal.  No consolidative airspace disease.  No pleural effusions.  No pneumothorax.  No pulmonary nodule or mass noted.  Pulmonary vasculature and the cardiomediastinal silhouette are within normal limits.  Mild bilateral apical pleuroparenchymal thickening most compatible with chronic scarring, unchanged compared to the prior examination.  IMPRESSION: 1. No radiographic evidence of acute cardiopulmonary disease.   Original Report Authenticated By: Trudie Reed, M.D.    Nm Sentinel Node Inj-no Rpt (breast)  09/11/2012  CLINICAL DATA: Invasive right breast cancer   Sulfur colloid was injected intradermally by the nuclear medicine  technologist for breast cancer sentinel node localization.      ASSESSMENT: 57 year old female with  #1 invasive ductal carcinoma of the right breast status post bilateral mastectomies for a 2.3 cm node negative essentially triple negative disease on the right. Without any evidence of malignancy on the left. Patient's a candidate for adjuvant chemotherapy. We discussed Adriamycin Cytoxan dose dense followed by weekly Taxol Carbo. Since her tumor was 2% estrogen receptor positive we will utilize an aromatase inhibitor for 5 years. She understands the rationale for this.  #2 she will need a Port-A-Cath placed as well as an echocardiogram and chemotherapy teaching class. We will set this up. Hopefully we can get her started on chemotherapy in the beginning of April.   PLAN:  #1 patient will proceed with cycle #1 day 1 of chemotherapy.  #2 she will return in one week's time for followup.  All questions were answered. The patient knows to  call the clinic with any problems, questions or concerns. We can certainly see the patient much sooner if necessary.  I spent 25 minutes counseling the patient face to face. The total time spent in the appointment was 30 minutes.    Drue Second, MD Medical/Oncology [facility] [phone] (beeper) [phone] (Office)  10/15/2012, 11:38 AM

## 2012-10-15 NOTE — Patient Instructions (Addendum)
[facility] Discharge Instructions for Patients Receiving Chemotherapy  Today you received the following chemotherapy agents adriamycin/ cytoxan  To help prevent nausea and vomiting after your treatment, we encourage you to take your nausea medication  and take it as often as prescribed for the next    If you develop nausea and vomiting that is not controlled by your nausea medication, call the clinic. If it is after clinic hours your family physician or the after hours number for the clinic or go to the Emergency Department.   BELOW ARE SYMPTOMS THAT SHOULD BE REPORTED IMMEDIATELY:  *FEVER GREATER THAN 100.5 F  *CHILLS WITH OR WITHOUT FEVER  NAUSEA AND VOMITING THAT IS NOT CONTROLLED WITH YOUR NAUSEA MEDICATION  *UNUSUAL SHORTNESS OF BREATH  *UNUSUAL BRUISING OR BLEEDING  TENDERNESS IN MOUTH AND THROAT WITH OR WITHOUT PRESENCE OF ULCERS  *URINARY PROBLEMS  *BOWEL PROBLEMS  UNUSUAL RASH Items with * indicate a potential emergency and should be followed up as soon as possible.  One of the nurses will contact you 24 hours after your treatment. Please let the nurse know about any problems that you may have experienced. Feel free to call the clinic you have any questions or concerns. The clinic phone number is [phone].   I have been informed and understand all the instructions given to me. I know to contact the clinic, my physician, or go to the Emergency Department if any problems should occur. I do not have any questions at this time, but understand that I may call the clinic during office hours   should I have any questions or need assistance in obtaining follow up care.    __________________________________________  _____________  __________ Signature of Patient or Authorized Representative            Date                   Time    __________________________________________ Nurse's Signature   Cyclophosphamide injection What is this  medicine? CYCLOPHOSPHAMIDE (sye kloe FOSS fa mide) is a chemotherapy drug. It slows the growth of cancer cells. This medicine is used to treat many types of cancer like lymphoma, myeloma, leukemia, breast cancer, and ovarian cancer, to name a few. It is also used to treat nephrotic syndrome in children. This medicine may be used for other purposes; ask your health care provider or pharmacist if you have questions. What should I tell my health care provider before I take this medicine? They need to know if you have any of these conditions: -blood disorders -history of other chemotherapy -history of radiation therapy -infection -kidney disease -liver disease -tumors in the bone marrow -an unusual or allergic reaction to cyclophosphamide, other chemotherapy, other medicines, foods, dyes, or preservatives -pregnant or trying to get pregnant -breast-feeding How should I use this medicine? This drug is usually given as an injection into a vein or muscle or by infusion into a vein. It is administered in a hospital or clinic by a specially trained health care professional. Talk to your pediatrician regarding the use of this medicine in children. While this drug may be prescribed for selected conditions, precautions do apply. Overdosage: If you think you have taken too much of this medicine contact a poison control center or emergency room at once. NOTE: This medicine is only for you. Do not share this medicine with others. What if I miss a dose? It is important not to miss your dose. Call your doctor or health  care professional if you are unable to keep an appointment. What may interact with this medicine? Do not take this medicine with any of the following medications: -mibefradil -nalidixic acid This medicine may also interact with the following medications: -doxorubicin -etanercept -medicines to increase blood counts like filgrastim, pegfilgrastim, sargramostim -medicines that block muscle  or nerve pain -St. John's Wort -phenobarbital -succinylcholine chloride -trastuzumab -vaccines Talk to your doctor or health care professional before taking any of these medicines: -acetaminophen -aspirin -ibuprofen -ketoprofen -naproxen This list may not describe all possible interactions. Give your health care provider a list of all the medicines, herbs, non-prescription drugs, or dietary supplements you use. Also tell them if you smoke, drink alcohol, or use illegal drugs. Some items may interact with your medicine. What should I watch for while using this medicine? Visit your doctor for checks on your progress. This drug may make you feel generally unwell. This is not uncommon, as chemotherapy can affect healthy cells as well as cancer cells. Report any side effects. Continue your course of treatment even though you feel ill unless your doctor tells you to stop. Drink water or other fluids as directed. Urinate often, even at night. In some cases, you may be given additional medicines to help with side effects. Follow all directions for their use. Call your doctor or health care professional for advice if you get a fever, chills or sore throat, or other symptoms of a cold or flu. Do not treat yourself. This drug decreases your body's ability to fight infections. Try to avoid being around people who are sick. This medicine may increase your risk to bruise or bleed. Call your doctor or health care professional if you notice any unusual bleeding. Be careful brushing and flossing your teeth or using a toothpick because you may get an infection or bleed more easily. If you have any dental work done, tell your dentist you are receiving this medicine. Avoid taking products that contain aspirin, acetaminophen, ibuprofen, naproxen, or ketoprofen unless instructed by your doctor. These medicines may hide a fever. Do not become pregnant while taking this medicine. Women should inform their doctor if  they wish to become pregnant or think they might be pregnant. There is a potential for serious side effects to an unborn child. Talk to your health care professional or pharmacist for more information. Do not breast-feed an infant while taking this medicine. Men should inform their doctor if they wish to father a child. This medicine may lower sperm counts. If you are going to have surgery, tell your doctor or health care professional that you have taken this medicine. What side effects may I notice from receiving this medicine? Side effects that you should report to your doctor or health care professional as soon as possible: -allergic reactions like skin rash, itching or hives, swelling of the face, lips, or tongue -low blood counts - this medicine may decrease the number of white blood cells, red blood cells and platelets. You may be at increased risk for infections and bleeding. -signs of infection - fever or chills, cough, sore throat, pain or difficulty passing urine -signs of decreased platelets or bleeding - bruising, pinpoint red spots on the skin, black, tarry stools, blood in the urine -signs of decreased red blood cells - unusually weak or tired, fainting spells, lightheadedness -breathing problems -dark urine -mouth sores -pain, swelling, redness at site where injected -swelling of the ankles, feet, hands -trouble passing urine or change in the amount of urine -weight  gain -yellowing of the eyes or skin Side effects that usually do not require medical attention (report to your doctor or health care professional if they continue or are bothersome): -changes in nail or skin color -diarrhea -hair loss -loss of appetite -missed menstrual periods -nausea, vomiting -stomach pain This list may not describe all possible side effects. Call your doctor for medical advice about side effects. You may report side effects to FDA at 1-800-FDA-1088. Where should I keep my medicine? This drug  is given in a hospital or clinic and will not be stored at home. NOTE: This sheet is a summary. It may not cover all possible information. If you have questions about this medicine, talk to your doctor, pharmacist, or health care provider.  2013, [employer]. (10/07/2007 2:32:25 PM) Doxorubicin injection What is this medicine? DOXORUBICIN (dox oh ROO bi sin) is a chemotherapy drug. It is used to treat many kinds of cancer like Hodgkin's disease, leukemia, non-Hodgkin's lymphoma, neuroblastoma, sarcoma, and Wilms' tumor. It is also used to treat bladder cancer, breast cancer, lung cancer, ovarian cancer, stomach cancer, and thyroid cancer. This medicine may be used for other purposes; ask your health care provider or pharmacist if you have questions. What should I tell my health care provider before I take this medicine? They need to know if you have any of these conditions: -blood disorders -heart disease, recent heart attack -infection (especially a virus infection such as chickenpox, cold sores, or herpes) -irregular heartbeat -liver disease -recent or ongoing radiation therapy -an unusual or allergic reaction to doxorubicin, other chemotherapy agents, other medicines, foods, dyes, or preservatives -pregnant or trying to get pregnant -breast-feeding How should I use this medicine? This drug is given as an infusion into a vein. It is administered in a hospital or clinic by a specially trained health care professional. If you have pain, swelling, burning or any unusual feeling around the site of your injection, tell your health care professional right away. Talk to your pediatrician regarding the use of this medicine in children. Special care may be needed. Overdosage: If you think you have taken too much of this medicine contact a poison control center or emergency room at once. NOTE: This medicine is only for you. Do not share this medicine with others. What if I miss a dose? It  is important not to miss your dose. Call your doctor or health care professional if you are unable to keep an appointment. What may interact with this medicine? Do not take this medicine with any of the following medications: -cisapride -droperidol -halofantrine -pimozide -zidovudine This medicine may also interact with the following medications: -chloroquine -chlorpromazine -clarithromycin -cyclophosphamide -cyclosporine -erythromycin -medicines for depression, anxiety, or psychotic disturbances -medicines for irregular heart beat like amiodarone, bepridil, dofetilide, encainide, flecainide, propafenone, quinidine -medicines for seizures like ethotoin, fosphenytoin, phenytoin -medicines for nausea, vomiting like dolasetron, ondansetron, palonosetron -medicines to increase blood counts like filgrastim, pegfilgrastim, sargramostim -methadone -methotrexate -pentamidine -progesterone -vaccines -verapamil Talk to your doctor or health care professional before taking any of these medicines: -acetaminophen -aspirin -ibuprofen -ketoprofen -naproxen This list may not describe all possible interactions. Give your health care provider a list of all the medicines, herbs, non-prescription drugs, or dietary supplements you use. Also tell them if you smoke, drink alcohol, or use illegal drugs. Some items may interact with your medicine. What should I watch for while using this medicine? Your condition will be monitored carefully while you are receiving this medicine. You will need important blood work  done while you are taking this medicine. This drug may make you feel generally unwell. This is not uncommon, as chemotherapy can affect healthy cells as well as cancer cells. Report any side effects. Continue your course of treatment even though you feel ill unless your doctor tells you to stop. Your urine may turn red for a few days after your dose. This is not blood. If your urine is dark or  brown, call your doctor. In some cases, you may be given additional medicines to help with side effects. Follow all directions for their use. Call your doctor or health care professional for advice if you get a fever, chills or sore throat, or other symptoms of a cold or flu. Do not treat yourself. This drug decreases your body's ability to fight infections. Try to avoid being around people who are sick. This medicine may increase your risk to bruise or bleed. Call your doctor or health care professional if you notice any unusual bleeding. Be careful brushing and flossing your teeth or using a toothpick because you may get an infection or bleed more easily. If you have any dental work done, tell your dentist you are receiving this medicine. Avoid taking products that contain aspirin, acetaminophen, ibuprofen, naproxen, or ketoprofen unless instructed by your doctor. These medicines may hide a fever. Men and women of childbearing age should use effective birth control methods while using taking this medicine. Do not become pregnant while taking this medicine. There is a potential for serious side effects to an unborn child. Talk to your health care professional or pharmacist for more information. Do not breast-feed an infant while taking this medicine. Do not let others touch your urine or other body fluids for 5 days after each treatment with this medicine. Caregivers should wear latex gloves to avoid touching body fluids during this time. What side effects may I notice from receiving this medicine? Side effects that you should report to your doctor or health care professional as soon as possible: -allergic reactions like skin rash, itching or hives, swelling of the face, lips, or tongue -low blood counts - this medicine may decrease the number of white blood cells, red blood cells and platelets. You may be at increased risk for infections and bleeding. -signs of infection - fever or chills, cough, sore  throat, pain or difficulty passing urine -signs of decreased platelets or bleeding - bruising, pinpoint red spots on the skin, black, tarry stools, blood in the urine -signs of decreased red blood cells - unusually weak or tired, fainting spells, lightheadedness -breathing problems -chest pain -fast, irregular heartbeat -mouth sores -nausea, vomiting -pain, swelling, redness at site where injected -pain, tingling, numbness in the hands or feet -swelling of ankles, feet, or hands -unusual bleeding or bruising Side effects that usually do not require medical attention (report to your doctor or health care professional if they continue or are bothersome): -diarrhea -facial flushing -hair loss -loss of appetite -missed menstrual periods -nail discoloration or damage -red or watery eyes -red colored urine -stomach upset This list may not describe all possible side effects. Call your doctor for medical advice about side effects. You may report side effects to FDA at 1-800-FDA-1088. Where should I keep my medicine? This drug is given in a hospital or clinic and will not be stored at home. NOTE: This sheet is a summary. It may not cover all possible information. If you have questions about this medicine, talk to your doctor, pharmacist, or health care provider.  2013, [employer]. (10/21/2007 5:07:32 PM)

## 2012-10-16 ENCOUNTER — Telehealth: Payer: Self-pay | Admitting: *Deleted

## 2012-10-16 ENCOUNTER — Other Ambulatory Visit: Payer: Self-pay | Admitting: Medical Oncology

## 2012-10-16 ENCOUNTER — Ambulatory Visit (HOSPITAL_BASED_OUTPATIENT_CLINIC_OR_DEPARTMENT_OTHER): Payer: Private Health Insurance - Indemnity

## 2012-10-16 VITALS — BP 130/63 | HR 92 | Temp 97.8°F

## 2012-10-16 DIAGNOSIS — C50319 Malignant neoplasm of lower-inner quadrant of unspecified female breast: Secondary | ICD-10-CM

## 2012-10-16 DIAGNOSIS — Z5189 Encounter for other specified aftercare: Secondary | ICD-10-CM

## 2012-10-16 MED ORDER — LORAZEPAM 0.5 MG PO TABS: 0.5000 mg | ORAL_TABLET | Freq: Four times a day (QID) | ORAL | Status: DC | PRN

## 2012-10-16 MED ORDER — UNABLE TO FIND: 1.0000 | Freq: Once | Status: DC

## 2012-10-16 MED ORDER — PEGFILGRASTIM INJECTION 6 MG/0.6ML
6.0000 mg | Freq: Once | SUBCUTANEOUS | Status: AC
  Administered 2012-10-16: 6 mg via SUBCUTANEOUS
  Filled 2012-10-16: qty 0.6

## 2012-10-16 NOTE — Patient Instructions (Addendum)

## 2012-10-16 NOTE — Telephone Encounter (Signed)
Cloris here for Neulasta injection following 1st ac chemo treatment.  Husband in with her.  States that she is doing well.  No nausea, vomiting, or diarrhea.   Is drinking lots of fluids and eating lite foods.   All questions answered.   Knows to call the office if she has any problems or concerns.

## 2012-10-17 ENCOUNTER — Telehealth: Payer: Self-pay | Admitting: Medical Oncology

## 2012-10-17 NOTE — Telephone Encounter (Signed)
Pt called to clarify decadron instructions, reviewed with Dr Welton Flakes, pt to follow directions given to her by MD at her appt. Pt expressed verbal understanding. Knows to call office with any questions or concerns.

## 2012-10-22 ENCOUNTER — Encounter: Payer: Self-pay | Admitting: Oncology

## 2012-10-22 ENCOUNTER — Telehealth: Payer: Self-pay | Admitting: *Deleted

## 2012-10-22 ENCOUNTER — Ambulatory Visit (HOSPITAL_BASED_OUTPATIENT_CLINIC_OR_DEPARTMENT_OTHER): Payer: Private Health Insurance - Indemnity | Admitting: Oncology

## 2012-10-22 ENCOUNTER — Other Ambulatory Visit (HOSPITAL_BASED_OUTPATIENT_CLINIC_OR_DEPARTMENT_OTHER): Payer: Private Health Insurance - Indemnity | Admitting: Lab

## 2012-10-22 VITALS — BP 126/89 | HR 89 | Temp 97.9°F | Resp 20 | Ht 66.0 in | Wt 136.4 lb

## 2012-10-22 DIAGNOSIS — C50319 Malignant neoplasm of lower-inner quadrant of unspecified female breast: Secondary | ICD-10-CM

## 2012-10-22 DIAGNOSIS — Z171 Estrogen receptor negative status [ER-]: Secondary | ICD-10-CM

## 2012-10-22 DIAGNOSIS — D709 Neutropenia, unspecified: Secondary | ICD-10-CM

## 2012-10-22 DIAGNOSIS — F411 Generalized anxiety disorder: Secondary | ICD-10-CM

## 2012-10-22 DIAGNOSIS — C50311 Malignant neoplasm of lower-inner quadrant of right female breast: Secondary | ICD-10-CM

## 2012-10-22 LAB — CBC WITH DIFFERENTIAL/PLATELET
BASO%: 2.8 % — ABNORMAL HIGH (ref 0.0–2.0)
Basophils Absolute: 0 10*3/uL (ref 0.0–0.1)
EOS%: 27.8 % — ABNORMAL HIGH (ref 0.0–7.0)
Eosinophils Absolute: 0.4 10*3/uL (ref 0.0–0.5)
HCT: 35.3 % (ref 34.8–46.6)
HGB: 11.8 g/dL (ref 11.6–15.9)
LYMPH%: 58.3 % — ABNORMAL HIGH (ref 14.0–49.7)
MCH: 26.3 pg (ref 25.1–34.0)
MCHC: 33.4 g/dL (ref 31.5–36.0)
MCV: 78.6 fL — ABNORMAL LOW (ref 79.5–101.0)
MONO#: 0 10*3/uL — ABNORMAL LOW (ref 0.1–0.9)
MONO%: 1.4 % (ref 0.0–14.0)
NEUT#: 0.1 10*3/uL — CL (ref 1.5–6.5)
NEUT%: 9.7 % — ABNORMAL LOW (ref 38.4–76.8)
Platelets: 152 10*3/uL (ref 145–400)
RBC: 4.49 10*6/uL (ref 3.70–5.45)
RDW: 15.5 % — ABNORMAL HIGH (ref 11.2–14.5)
WBC: 1.4 10*3/uL — ABNORMAL LOW (ref 3.9–10.3)
lymph#: 0.8 10*3/uL — ABNORMAL LOW (ref 0.9–3.3)
nRBC: 0 % (ref 0–0)

## 2012-10-22 LAB — COMPREHENSIVE METABOLIC PANEL (CC13)
ALT: 96 U/L — ABNORMAL HIGH (ref 0–55)
AST: 66 U/L — ABNORMAL HIGH (ref 5–34)
Albumin: 3.6 g/dL (ref 3.5–5.0)
Alkaline Phosphatase: 178 U/L — ABNORMAL HIGH (ref 40–150)
BUN: 14.4 mg/dL (ref 7.0–26.0)
CO2: 22 mEq/L (ref 22–29)
Calcium: 9.4 mg/dL (ref 8.4–10.4)
Chloride: 104 mEq/L (ref 98–107)
Creatinine: 0.7 mg/dL (ref 0.6–1.1)
Glucose: 105 mg/dl — ABNORMAL HIGH (ref 70–99)
Potassium: 4.4 mEq/L (ref 3.5–5.1)
Sodium: 134 mEq/L — ABNORMAL LOW (ref 136–145)
Total Bilirubin: 0.52 mg/dL (ref 0.20–1.20)
Total Protein: 7.1 g/dL (ref 6.4–8.3)

## 2012-10-22 MED ORDER — VENLAFAXINE HCL ER 37.5 MG PO CP24: 37.5000 mg | ORAL_CAPSULE | Freq: Every day | ORAL | Status: DC

## 2012-10-22 MED ORDER — OMEPRAZOLE 20 MG PO CPDR: 20.0000 mg | DELAYED_RELEASE_CAPSULE | Freq: Every day | ORAL | Status: DC

## 2012-10-22 NOTE — Progress Notes (Signed)
OFFICE PROGRESS NOTE  CC  [name], MD [address] [place] [address] Dr. Lurline Hare  Dr. Avel Peace  DIAGNOSIS: 57 year old female with new diagnosis of stage II a breast cancer that is triple negative     STAGE:  Cancer of lower-inner quadrant of female breast  Primary site: Breast (Right)  Staging method: AJCC 7th Edition  Clinical: Stage IIA (T2, N0, cM0)  Summary: Stage IA (T2, N0, cM0)   PRIOR THERAPY: #1Patient's last mammogram was about 5 years ago. Recently she underwent a screening mammogram that showed a spiculated mass in the lower inner quadrant of the right breast. She had ultrasound performed that showed irregular mass at the 5:00 position 3 cm from the nipple measuring 1.1 cm. She had a needle core biopsy performed on 06/19/2012 the biopsy showed invasive ductal carcinoma with ductal carcinoma in situ grade 3 tumor was ER negative PR negative HER-2/neu negative. Ki-67 was elevated at 34%. She went on to have MRI of the breasts performed on 06/24/2012 the MRI showed in the middle third of the lower inner quadrant of the right breast a 1.5 x 1.3 x 1.9 cm irregular enhancing mass. No abnormal enhancement was seen in the left breast no enlarged axillary or internal mammary adenopathy was detected.   #2 patient is now status post bilateral mastectomies. On her right mastectomy she was found to have a 2.3 cm invasive ductal carcinoma 01 lymph nodes were positive for metastatic disease and this is T2 N0) stage II. Tumor was grade 3 ER 2% PR negative HER-2/neu negative with a Ki-67 at 34%. Left mastectomy only showed intraductal papilloma node atypia or malignancy was found.  #3 patient is now being considered for adjuvant chemotherapy since her tumor is essentially triple negative and is a stage II. We discussed the rationale for adjuvant chemotherapy. She will receive Adriamycin Cytoxan every 2 weeks for a total of 4 cycles with G-CSF support.  Once she completes this then she will receive Taxol and carboplatinum every week for a total of 12 weeks. I did discuss with her use of antiestrogen therapy such as an aromatase inhibitor since her tumor is 2% ER positive. I discussed the rationale for this.  CURRENT THERAPY: Status post cycle 1 day 1 of Adriamycin and Cytoxan with day 2 Neulasta.  INTERVAL HISTORY: LONYA JOHANNESEN 57 y.o. female returns for followup. Overall she is doing well without any significant complaints. She tolerated her Adriamycin and Cytoxan well. She remains very anxious and nervous. She did have nausea but she took her antiemetics as prescribed. She does tell me that she has been quite emotional and is on a roller coaster with her emotions. We discussed the possibility of getting her on a mood stabilizing drug such as Effexor. She was agreeable to this. She today feels better she's eating. Patient however is neutropenic. We discussed neutropenic precautions. She knows to call me if she develops any kind of fevers. A copy of her blood work was given to her. Remainder of the 10 point review of systems is negative. MEDICAL HISTORY: Past Medical History  Diagnosis Date  . Thyroid disease   . Depression   . Sprue   . Diverticulosis   . Hyperlipidemia   . Breast cancer     right  . Hypothyroidism   . Pneumonia     ALLERGIES:  is allergic to gluten meal.  MEDICATIONS:  Current Outpatient Prescriptions  Medication Sig Dispense Refill  . acetaminophen (TYLENOL) 325  MG tablet Take 650 mg by mouth as needed.      . diazepam (VALIUM) 2 MG tablet Take 1 tablet (2 mg total) by mouth every 12 (twelve) hours as needed.  30 tablet  0  . HYDROcodone-acetaminophen (NORCO/VICODIN) 5-325 MG per tablet Take 1 tablet by mouth every 6 (six) hours as needed.  30 tablet  0  . levothyroxine (SYNTHROID, LEVOTHROID) 25 MCG tablet Take 25 mcg by mouth daily.      [name] lidocaine-prilocaine (EMLA) cream Apply topically as needed.  30 g  8   . liothyronine (CYTOMEL) 25 MCG tablet Take 12.5 mcg by mouth daily.      [name] LORazepam (ATIVAN) 0.5 MG tablet Take 1 tablet (0.5 mg total) by mouth every 6 (six) hours as needed (Nausea or vomiting).  30 tablet  0  . Multiple Vitamin (MULTIVITAMIN) tablet Take 1 tablet by mouth daily.      . ondansetron (ZOFRAN) 8 MG tablet Take 1 tablet (8 mg total) by mouth 2 (two) times daily as needed. Take two times a day as needed for nausea or vomiting starting on the third day after chemotherapy.  30 tablet  1  . prochlorperazine (COMPAZINE) 10 MG tablet Take 1 tablet (10 mg total) by mouth every 6 (six) hours as needed (Nausea or vomiting).  30 tablet  1  . Sulfamethoxazole-Trimethoprim (BACTRIM PO) Take by mouth 2 (two) times daily.      [name] UNABLE TO FIND Apply 1 Device topically once. Med Name: Cranial Prosthesis  1 Device  0  . dexamethasone (DECADRON) 4 MG tablet Take 2 tablets by mouth once a day on the day after chemotherapy and then take 2 tablets two times a day for 2 days. Take with food.  30 tablet  1  . docusate sodium (COLACE) 100 MG capsule TAKE ONE CAPSULE BY MOUTH EVERY 8 HOURS AS NEEDED  10 capsule  0  . oxyCODONE (OXY IR/ROXICODONE) 5 MG immediate release tablet Take 1-2 tablets (5-10 mg total) by mouth every 4 (four) hours as needed.  50 tablet  0  . prochlorperazine (COMPAZINE) 25 MG suppository Place 1 suppository (25 mg total) rectally every 12 (twelve) hours as needed for nausea.  12 suppository  3   No current facility-administered medications for this visit.    SURGICAL HISTORY:  Past Surgical History  Procedure Laterality Date  . Esophagogastroduodenoscopy  2007    sprue  . Colonoscopy  2006  . Cryoblation of cervix    . Breast lumpectomy    . Total mastectomy Bilateral 09/11/2012    Procedure: bilateral MASTECTOMY;  Surgeon: Adolph Pollack, MD;  Location: [facility] OR;  Service: General;  Laterality: Bilateral;  . Axillary sentinel node biopsy Right 09/11/2012    Procedure:  AXILLARY SENTINEL lymph NODE  BIOPSY;  Surgeon: Adolph Pollack, MD;  Location: [facility] OR;  Service: General;  Laterality: Right;  right nuclear medicine injection 12:30   . Breast reconstruction with placement of tissue expander and flex hd (acellular hydrated dermis) Bilateral 09/11/2012    Procedure: BREAST RECONSTRUCTION WITH PLACEMENT OF TISSUE EXPANDER AND FLEX HD (ACELLULAR HYDRATED DERMIS) ADM;  Surgeon: Wayland Denis, DO;  Location: [facility] OR;  Service: Plastics;  Laterality: Bilateral;  . Incision and drainage of wound Left 09/16/2012    Procedure: Left Breast Evacuation of Hematoma;  Surgeon: Wayland Denis, DO;  Location: [facility] OR;  Service: Plastics;  Laterality: Left;  . Portacath placement Right 10/09/2012    Procedure: US GUIDED INSERTION  PORT-A-CATH;  Surgeon: Adolph Pollack, MD;  Location: [facility];  Service: General;  Laterality: Right;  Right Subclavian Vein    REVIEW OF SYSTEMS:  Pertinent items are noted in HPI.   HEALTH MAINTENANCE:  PHYSICAL EXAMINATION: Blood pressure 126/89, pulse 89, temperature 97.9 F (36.6 C), temperature source Oral, resp. rate 20, height 5\' 6"  (1.676 m), weight 136 lb 6.4 oz (61.871 kg), last menstrual period 09/11/2012. Body mass index is 22.03 kg/(m^2). ECOG PERFORMANCE STATUS: 1 - Symptomatic but completely ambulatory   General appearance: alert, cooperative and appears stated age Resp: clear to auscultation bilaterally Cardio: regular rate and rhythm GI: soft, non-tender; bowel sounds normal; no masses,  no organomegaly Extremities: extremities normal, atraumatic, no cyanosis or edema Neurologic: Grossly normal   LABORATORY DATA: Lab Results  Component Value Date   WBC 1.4* 10/22/2012   HGB 11.8 10/22/2012   HCT 35.3 10/22/2012   MCV 78.6* 10/22/2012   PLT 152 10/22/2012      Chemistry      Component Value Date/Time   NA 134* 10/22/2012 1028   NA 138 09/15/2012 2035   K 4.4 10/22/2012 1028   K 3.6 09/15/2012 2035   CL 104 10/22/2012  1028   CL 103 09/15/2012 2035   CO2 22 10/22/2012 1028   CO2 26 09/15/2012 2035   BUN 14.4 10/22/2012 1028   BUN 4* 09/15/2012 2035   CREATININE 0.7 10/22/2012 1028   CREATININE 0.54 09/15/2012 2035      Component Value Date/Time   CALCIUM 9.4 10/22/2012 1028   CALCIUM 9.0 09/15/2012 2035   ALKPHOS 178* 10/22/2012 1028   ALKPHOS 142* 09/15/2012 2035   AST 66* 10/22/2012 1028   AST 32 09/15/2012 2035   ALT 96* 10/22/2012 1028   ALT 24 09/15/2012 2035   BILITOT 0.52 10/22/2012 1028   BILITOT 0.8 09/15/2012 2035     ADDITIONAL INFORMATION: 3. CHROMOGENIC IN-SITU HYBRIDIZATION Interpretation HER-2/NEU BY CISH - NO AMPLIFICATION OF HER-2 DETECTED. THE RATIO OF HER-2: CEP 17 SIGNALS WAS 1.52. Reference range: Ratio: HER2:CEP17 < 1.8 - gene amplification not observed Ratio: HER2:CEP 17 1.8-2.2 - equivocal result Ratio: HER2:CEP17 > 2.2 - gene amplification observed Pecola Leisure MD Pathologist, Electronic Signature ( Signed 09/22/2012) 3. PROGNOSTIC INDICATORS - ACIS Results IMMUNOHISTOCHEMICAL AND MORPHOMETRIC ANALYSIS BY THE AUTOMATED CELLULAR IMAGING SYSTEM (ACIS) Estrogen Receptor (Negative, <1%): 2%, POSITIVE, WEAK STAINING INTENSITY Progesterone Receptor (Negative, <1%): 0%, NEGATIVE COMMENT: The negative hormone receptor study in this case has an internal positive control. All controls stained appropriately Pecola Leisure MD Pathologist, Electronic Signature ( Signed 09/22/2012) 1 of 4 FINAL for Gascoigne, Weltha A [id number]) FINAL DIAGNOSIS Diagnosis 1. Lymph node, sentinel, biopsy, Right axilla - ONE LYMPH NODE, NEGATIVE FOR TUMOR (0/1). 2. Breast, simple mastectomy, Left - BENIGN BREAST TISSUE WITH INTRADUCTAL PAPILLOMA, SEE COMMENT. - NEGATIVE FOR ATYPIA OR MALIGNANCY. - SURGICAL MARGINS, NEGATIVE FOR ATYPIA OR MALIGNANCY. - MICROCALCIFICATIONS IDENTIFIED. 3. Breast, simple mastectomy, Right - INVASIVE DUCTAL CARCINOMA, GRADE III, WITH SPINDLE CELL DIFFERENTIATION (METAPLASTIC CARCINOMA)  (2.3CM), SEE COMMENT. - NO LYMPHOVASCULAR INVASION IDENTIFIED. - INVASIVE TUMOR IS 1.5 CM FROM NEAREST MARGIN (DEEP). - DUCTAL CARCINOMA IN SITU, GRADE III, WITH COMEDONECROSIS. - SEE TUMOR SYNOPTIC TEMPLATE BELOW. Microscopic Comment 2. Although there was no mass grossly identified, there are definitive morphologic features of intraductal papilloma with associated fibrocystic change present. In addition, there are fibrocystic changes with usual ductal hyperplasia in representative sections not involved by papilloma. Finally, foci of sclerosing adenosis and  microcalcifications in benign ducts and lobules are present. The surgical resection margin(s) of the specimen were inked and microscopically evaluated. 3. BREAST, INVASIVE TUMOR, WITH LYMPH NODE SAMPLING Specimen, including laterality: Right breast. Procedure: Simple mastectomy. Grade: III of III. Tubule formation: 3. Nuclear pleomorphism: 3. Mitotic: 2. Tumor size (gross measurement): 2.3 cm. Margins: Invasive, distance to closest margin: 1.5 cm. In-situ, distance to closest margin: 1.5 cm (deep). If margin positive, focally or broadly: N/A. Lymphovascular invasion: Absent. Ductal carcinoma in situ: Present. Grade: III of III. Extensive intraductal component: Absent. Lobular neoplasia: Absent. Tumor focality: Unifocal. Treatment effect: None. If present, treatment effect in breast tissue, lymph nodes or both: N/A. Extent of tumor: Skin and Nipple: Grossly negative. Skeletal muscle: N/A. Lymph nodes: # examined: 1. Lymph nodes with metastasis: 0. Breast prognostic profile: Estrogen receptor: Repeated; previous study demonstrates 0% positivity ([id number]). Progesterone receptor: Repeated; previous study demonstrates 0% positivity ([id number]). HER-2/neu: Repeated; previous study demonstrated no amplification (1.55) ([id number]). 2 of 4 FINAL for CHELLA, CHAPDELAINE A [phone]) Microscopic Comment(continued) Ki-67: Not  repeated; previous study demonstrates 34% proliferation rate [phone]). Non-neoplastic breast: Intraductal papilloma with fibrocystic change and usual ductal hyperplasia, fibrocystic change with usual ductal hyperplasia, microcalcifications, and previous biopsy site. TNM: pT2, pN0, pMX. Comments: Although there are definitive features of high grade invasive ductal carcinoma identified, there are multiple foci where the carcinoma assumes a spindle cell morphology / spindle cell differentiation. 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 There are no heterologous elements identified. (CRR:eps 09/15/12)  RADIOGRAPHIC STUDIES:  Chest 2 View  09/08/2012  *RADIOLOGY REPORT*  Clinical Data: Right-sided breast  CHEST - 2 VIEW  Comparison: Chest x-ray 12/06/2009.  Findings: Lung volumes are normal.  No consolidative airspace disease.  No pleural effusions.  No pneumothorax.  No pulmonary nodule or mass noted.  Pulmonary vasculature and the cardiomediastinal silhouette are within normal limits.  Mild bilateral apical pleuroparenchymal thickening most compatible with chronic scarring, unchanged compared to the prior examination.  IMPRESSION: 1. No radiographic evidence of acute cardiopulmonary disease.   Original Report Authenticated By: Trudie Reed, M.D.    Nm Sentinel Node Inj-no Rpt (breast)  09/11/2012  CLINICAL DATA: Invasive right breast cancer   Sulfur colloid was injected intradermally by the nuclear medicine  technologist for breast cancer sentinel node localization.      ASSESSMENT: 57 year old female with  #1 invasive ductal carcinoma of the right breast status post bilateral mastectomies for a 2.3 cm node negative essentially triple negative disease on the right. Without any evidence of malignancy on the left. Patient's a candidate for adjuvant chemotherapy. We discussed Adriamycin Cytoxan dose dense followed by  weekly Taxol Carbo. Since her tumor was 2% estrogen receptor positive we will utilize an aromatase inhibitor for 5 years. She understands the rationale for this.  #2 patient is now status post cycle 1 of Adriamycin Cytoxan given last week. Overall she has tolerated it well. She is neutropenic. I have recommended neutropenic precautions.  #3 patient is also having considerable anxiety I have recommended her taking Effexor.  #4 she will also begin Prilosec 20 mg daily for reflux.  PLAN:   #1 patient will start Effexor 37.5 mg daily.  #2 Prilosec 20 mg daily.   #3She will return in one week's time for cycle 2 of Adriamycin and Cytoxan. All questions were answered. The patient knows to call the clinic with any problems, questions or concerns. We can certainly see the  patient much sooner if necessary.  I spent 25 minutes counseling the patient face to face. The total time spent in the appointment was 30 minutes.    Drue Second, MD Medical/Oncology [facility] [phone] (beeper) [phone] (Office)  10/22/2012, 11:17 AM

## 2012-10-22 NOTE — Patient Instructions (Addendum)
Doing well  Begin effexor 37.5 mg daily  Take prilosec 20 mg daily  We will see you back in 1 week for cycle 2 of chemotherapy

## 2012-10-22 NOTE — Telephone Encounter (Signed)
Prior authorization request received and will be forwarded to [employer] for Omeprazole 20 mg.

## 2012-10-27 ENCOUNTER — Ambulatory Visit (INDEPENDENT_AMBULATORY_CARE_PROVIDER_SITE_OTHER): Payer: Private Health Insurance - Indemnity | Admitting: Internal Medicine

## 2012-10-27 ENCOUNTER — Encounter (INDEPENDENT_AMBULATORY_CARE_PROVIDER_SITE_OTHER): Payer: Self-pay | Admitting: General Surgery

## 2012-10-27 ENCOUNTER — Ambulatory Visit (INDEPENDENT_AMBULATORY_CARE_PROVIDER_SITE_OTHER): Payer: Private Health Insurance - Indemnity | Admitting: General Surgery

## 2012-10-27 ENCOUNTER — Encounter (INDEPENDENT_AMBULATORY_CARE_PROVIDER_SITE_OTHER): Payer: Private Health Insurance - Indemnity | Admitting: General Surgery

## 2012-10-27 ENCOUNTER — Encounter: Payer: Self-pay | Admitting: Internal Medicine

## 2012-10-27 VITALS — BP 130/78 | HR 76 | Temp 98.6°F | Resp 16 | Ht 66.0 in | Wt 138.0 lb

## 2012-10-27 VITALS — BP 120/82 | HR 74 | Temp 98.2°F | Resp 18 | Ht 66.0 in | Wt 138.2 lb

## 2012-10-27 DIAGNOSIS — Z9221 Personal history of antineoplastic chemotherapy: Secondary | ICD-10-CM

## 2012-10-27 DIAGNOSIS — F4323 Adjustment disorder with mixed anxiety and depressed mood: Secondary | ICD-10-CM

## 2012-10-27 DIAGNOSIS — C50911 Malignant neoplasm of unspecified site of right female breast: Secondary | ICD-10-CM

## 2012-10-27 DIAGNOSIS — C50919 Malignant neoplasm of unspecified site of unspecified female breast: Secondary | ICD-10-CM

## 2012-10-27 DIAGNOSIS — Z9889 Other specified postprocedural states: Secondary | ICD-10-CM

## 2012-10-27 DIAGNOSIS — Z5189 Encounter for other specified aftercare: Secondary | ICD-10-CM

## 2012-10-27 MED ORDER — VENLAFAXINE HCL ER 75 MG PO CP24: 75.0000 mg | ORAL_CAPSULE | Freq: Every day | ORAL | Status: DC

## 2012-10-27 NOTE — Patient Instructions (Signed)
Activities as tolerated. 

## 2012-10-27 NOTE — Progress Notes (Signed)
Procedure:  Bilateral mastectomies, right sentinel lymph node biopsy 09/11/12; port-a-cath insertion 10/09/12  Pathology:  T2N0 right breast cancer  History:  She is here for a postoperative visit. She has started chemotherapy. Port-A-Cath is working well.  Exam: General- Is in NAD. Chest-Port-A-Cath site is clean. Bilateral chest wall incisions are clean. Tissue expanders in place. Right axillary incision clean and intact the  Assessment:  Doing well overall. Tolerating chemotherapy fairly well.  Plan:  Return visit 3 months.

## 2012-10-27 NOTE — Progress Notes (Signed)
  Subjective:    Patient ID: Nancy Beard, female    DOB: [birth year], 57 y.o.   MRN: [id number]  HPI Decreased attention and anxiety over disease Complication over surgery Chemotherapy    Review of Systems  Constitutional: Negative for activity change, appetite change and fatigue.  HENT: Negative for ear pain, congestion, neck pain, postnasal drip and sinus pressure.   Eyes: Negative for redness and visual disturbance.  Respiratory: Negative for cough, shortness of breath and wheezing.   Gastrointestinal: Negative for abdominal pain and abdominal distention.  Genitourinary: Negative for dysuria, frequency and menstrual problem.  Musculoskeletal: Negative for myalgias, joint swelling and arthralgias.  Skin: Negative for rash and wound.  Neurological: Negative for dizziness, weakness and headaches.  Hematological: Negative for adenopathy. Does not bruise/bleed easily.  Psychiatric/Behavioral: Negative for sleep disturbance and decreased concentration.       Objective:   Physical Exam  Constitutional: She is oriented to person, place, and time. She appears well-developed and well-nourished. No distress.  HENT:  Head: Normocephalic and atraumatic.  Right Ear: External ear normal.  Left Ear: External ear normal.  Nose: Nose normal.  Mouth/Throat: Oropharynx is clear and moist.  Eyes: Conjunctivae and EOM are normal. Pupils are equal, round, and reactive to light.  Neck: Normal range of motion. Neck supple. No JVD present. No tracheal deviation present. No thyromegaly present.  Cardiovascular: Normal rate, regular rhythm, normal heart sounds and intact distal pulses.   No murmur heard. Pulmonary/Chest: Effort normal and breath sounds normal. She has no wheezes. She exhibits no tenderness.  Abdominal: Soft. Bowel sounds are normal.  Musculoskeletal: Normal range of motion. She exhibits no edema and no tenderness.  Lymphadenopathy:    She has no cervical adenopathy.   Neurological: She is alert and oriented to person, place, and time. She has normal reflexes. No cranial nerve deficit.  Skin: Skin is warm and dry. She is not diaphoretic.  Psychiatric: She has a normal mood and affect. Her behavior is normal.          Assessment & Plan:  Breast cancer treatment with chemotherapy Antidepressant therapy chose effexor by chemotherapy My chart discussion  I have spent more than 30 minutes examining this patient face-to-face of which over half was spent in counseling

## 2012-10-27 NOTE — Patient Instructions (Signed)
The patient is instructed to continue all medications as prescribed. Schedule followup with check out clerk upon leaving the clinic  

## 2012-10-29 ENCOUNTER — Ambulatory Visit (HOSPITAL_BASED_OUTPATIENT_CLINIC_OR_DEPARTMENT_OTHER): Payer: Private Health Insurance - Indemnity | Admitting: Adult Health

## 2012-10-29 ENCOUNTER — Other Ambulatory Visit (HOSPITAL_BASED_OUTPATIENT_CLINIC_OR_DEPARTMENT_OTHER): Payer: Private Health Insurance - Indemnity | Admitting: Lab

## 2012-10-29 ENCOUNTER — Encounter: Payer: Self-pay | Admitting: Adult Health

## 2012-10-29 ENCOUNTER — Ambulatory Visit (HOSPITAL_BASED_OUTPATIENT_CLINIC_OR_DEPARTMENT_OTHER): Payer: Private Health Insurance - Indemnity

## 2012-10-29 VITALS — BP 127/78 | HR 90 | Temp 98.2°F | Resp 20 | Ht 66.0 in | Wt 136.6 lb

## 2012-10-29 DIAGNOSIS — C50319 Malignant neoplasm of lower-inner quadrant of unspecified female breast: Secondary | ICD-10-CM

## 2012-10-29 DIAGNOSIS — Z5111 Encounter for antineoplastic chemotherapy: Secondary | ICD-10-CM

## 2012-10-29 DIAGNOSIS — C50311 Malignant neoplasm of lower-inner quadrant of right female breast: Secondary | ICD-10-CM

## 2012-10-29 DIAGNOSIS — C50312 Malignant neoplasm of lower-inner quadrant of left female breast: Secondary | ICD-10-CM

## 2012-10-29 DIAGNOSIS — C773 Secondary and unspecified malignant neoplasm of axilla and upper limb lymph nodes: Secondary | ICD-10-CM

## 2012-10-29 DIAGNOSIS — F411 Generalized anxiety disorder: Secondary | ICD-10-CM

## 2012-10-29 DIAGNOSIS — Z171 Estrogen receptor negative status [ER-]: Secondary | ICD-10-CM

## 2012-10-29 LAB — CBC WITH DIFFERENTIAL/PLATELET
BASO%: 1.1 % (ref 0.0–2.0)
Basophils Absolute: 0.1 10*3/uL (ref 0.0–0.1)
EOS%: 0.1 % (ref 0.0–7.0)
Eosinophils Absolute: 0 10*3/uL (ref 0.0–0.5)
HCT: 35.2 % (ref 34.8–46.6)
HGB: 11.5 g/dL — ABNORMAL LOW (ref 11.6–15.9)
LYMPH%: 19.4 % (ref 14.0–49.7)
MCH: 26 pg (ref 25.1–34.0)
MCHC: 32.7 g/dL (ref 31.5–36.0)
MCV: 79.5 fL (ref 79.5–101.0)
MONO#: 1 10*3/uL — ABNORMAL HIGH (ref 0.1–0.9)
MONO%: 9.1 % (ref 0.0–14.0)
NEUT#: 8 10*3/uL — ABNORMAL HIGH (ref 1.5–6.5)
NEUT%: 70.3 % (ref 38.4–76.8)
Platelets: 363 10*3/uL (ref 145–400)
RBC: 4.43 10*6/uL (ref 3.70–5.45)
RDW: 16.2 % — ABNORMAL HIGH (ref 11.2–14.5)
WBC: 11.3 10*3/uL — ABNORMAL HIGH (ref 3.9–10.3)
lymph#: 2.2 10*3/uL (ref 0.9–3.3)
nRBC: 1 % — ABNORMAL HIGH (ref 0–0)

## 2012-10-29 LAB — COMPREHENSIVE METABOLIC PANEL (CC13)
ALT: 51 U/L (ref 0–55)
AST: 28 U/L (ref 5–34)
Albumin: 3.8 g/dL (ref 3.5–5.0)
Alkaline Phosphatase: 180 U/L — ABNORMAL HIGH (ref 40–150)
BUN: 5.5 mg/dL — ABNORMAL LOW (ref 7.0–26.0)
CO2: 24 mEq/L (ref 22–29)
Calcium: 9.1 mg/dL (ref 8.4–10.4)
Chloride: 104 mEq/L (ref 98–107)
Creatinine: 0.7 mg/dL (ref 0.6–1.1)
Glucose: 94 mg/dl (ref 70–99)
Potassium: 3.8 mEq/L (ref 3.5–5.1)
Sodium: 139 mEq/L (ref 136–145)
Total Bilirubin: <0.2 mg/dL (ref 0.20–1.20)
Total Protein: 7.1 g/dL (ref 6.4–8.3)

## 2012-10-29 MED ORDER — DEXAMETHASONE SODIUM PHOSPHATE 4 MG/ML IJ SOLN
12.0000 mg | Freq: Once | INTRAMUSCULAR | Status: AC
  Administered 2012-10-29: 12 mg via INTRAVENOUS

## 2012-10-29 MED ORDER — VALACYCLOVIR HCL 500 MG PO TABS: 500.0000 mg | ORAL_TABLET | Freq: Two times a day (BID) | ORAL | Status: DC

## 2012-10-29 MED ORDER — SODIUM CHLORIDE 0.9 % IJ SOLN
10.0000 mL | INTRAMUSCULAR | Status: DC | PRN
  Administered 2012-10-29: 10 mL
  Filled 2012-10-29: qty 10

## 2012-10-29 MED ORDER — DOXORUBICIN HCL CHEMO IV INJECTION 2 MG/ML
60.0000 mg/m2 | Freq: Once | INTRAVENOUS | Status: AC
  Administered 2012-10-29: 108 mg via INTRAVENOUS
  Filled 2012-10-29: qty 54

## 2012-10-29 MED ORDER — SODIUM CHLORIDE 0.9 % IV SOLN
Freq: Once | INTRAVENOUS | Status: AC
  Administered 2012-10-29: 15:00:00 via INTRAVENOUS

## 2012-10-29 MED ORDER — ONDANSETRON HCL 8 MG PO TABS: 8.0000 mg | ORAL_TABLET | Freq: Two times a day (BID) | ORAL | Status: DC | PRN

## 2012-10-29 MED ORDER — HEPARIN SOD (PORK) LOCK FLUSH 100 UNIT/ML IV SOLN
500.0000 [IU] | Freq: Once | INTRAVENOUS | Status: AC | PRN
  Administered 2012-10-29: 500 [IU]
  Filled 2012-10-29: qty 5

## 2012-10-29 MED ORDER — SODIUM CHLORIDE 0.9 % IV SOLN
600.0000 mg/m2 | Freq: Once | INTRAVENOUS | Status: AC
  Administered 2012-10-29: 1080 mg via INTRAVENOUS
  Filled 2012-10-29: qty 54

## 2012-10-29 MED ORDER — PROCHLORPERAZINE MALEATE 10 MG PO TABS: 10.0000 mg | ORAL_TABLET | Freq: Four times a day (QID) | ORAL | Status: DC | PRN

## 2012-10-29 MED ORDER — LORAZEPAM 2 MG/ML IJ SOLN
0.5000 mg | Freq: Once | INTRAMUSCULAR | Status: AC
  Administered 2012-10-29: 0.5 mg via INTRAVENOUS

## 2012-10-29 MED ORDER — SODIUM CHLORIDE 0.9 % IV SOLN
150.0000 mg | Freq: Once | INTRAVENOUS | Status: AC
  Administered 2012-10-29: 150 mg via INTRAVENOUS
  Filled 2012-10-29: qty 5

## 2012-10-29 MED ORDER — PALONOSETRON HCL INJECTION 0.25 MG/5ML
0.2500 mg | Freq: Once | INTRAVENOUS | Status: AC
  Administered 2012-10-29: 0.25 mg via INTRAVENOUS

## 2012-10-29 NOTE — Progress Notes (Signed)
OFFICE PROGRESS NOTE  CC  Nancy Mew, MD [address] [place] [address] Dr. Lurline Beard  Dr. Avel Beard  DIAGNOSIS: 57 year old female with new diagnosis of stage II Beard breast cancer that is triple negative     STAGE:  Cancer of lower-inner quadrant of female breast  Primary site: Breast (Right)  Staging method: AJCC 7th Edition  Clinical: Stage IIA (T2, N0, cM0)  Summary: Stage IA (T2, N0, cM0)   PRIOR THERAPY: #1Patient's last mammogram was about 5 years ago. Recently she underwent Beard screening mammogram that showed Beard spiculated mass in the lower inner quadrant of the right breast. She had ultrasound performed that showed irregular mass at the 5:00 position 3 cm from the nipple measuring 1.1 cm. She had Beard needle core biopsy performed on 06/19/2012 the biopsy showed invasive ductal carcinoma with ductal carcinoma in situ grade 3 tumor was ER negative PR negative HER-2/neu negative. Ki-67 was elevated at 34%. She went on to have MRI of the breasts performed on 06/24/2012 the MRI showed in the middle third of the lower inner quadrant of the right breast Beard 1.5 x 1.3 x 1.9 cm irregular enhancing mass. No abnormal enhancement was seen in the left breast no enlarged axillary or internal mammary adenopathy was detected.   #2 patient is now status post bilateral mastectomies. On her right mastectomy she was found to have Beard 2.3 cm invasive ductal carcinoma 01 lymph nodes were positive for metastatic disease and this is T2 N0) stage II. Tumor was grade 3 ER 2% PR negative HER-2/neu negative with Beard Ki-67 at 34%. Left mastectomy only showed intraductal papilloma node atypia or malignancy was found.  #3 patient is now being considered for adjuvant chemotherapy since her tumor is essentially triple negative and is Beard stage II. We discussed the rationale for adjuvant chemotherapy. She will receive Adriamycin Cytoxan every 2 weeks for Beard total of 4 cycles with G-CSF support.  Once she completes this then she will receive Taxol and carboplatinum every week for Beard total of 12 weeks. I did discuss with her use of antiestrogen therapy such as an aromatase inhibitor since her tumor is 2% ER positive. I discussed the rationale for this.  CURRENT THERAPY:Cycle 2 day 1 Adriamycin Cytoxan with day 2 Neulasta.  INTERVAL HISTORY: Nancy Beard 57 y.o. female returns for followup. Overall she is doing well without any significant complaints. She was put on Effexor for anxiety.  She has since seen her PCP and he increased the dose. This is helping her.  She does develop fever blisters with her treatment.  Otherwise, she denies fevers, chills, nausea, vomiting, constipation, diarrhea, numbness or any further concerns.    MEDICAL HISTORY: Past Medical History  Diagnosis Date  . Thyroid disease   . Depression   . Sprue   . Diverticulosis   . Hyperlipidemia   . Breast cancer     right  . Hypothyroidism   . Pneumonia     ALLERGIES:  is allergic to gluten meal.  MEDICATIONS:  Current Outpatient Prescriptions  Medication Sig Dispense Refill  . acetaminophen (TYLENOL) 325 MG tablet Take 650 mg by mouth as needed.      [name] dexamethasone (DECADRON) 4 MG tablet Take 2 tablets by mouth once Beard day on the day after chemotherapy and then take 2 tablets two times Beard day for 2 days. Take with food.  30 tablet  1  . HYDROcodone-acetaminophen (NORCO/VICODIN) 5-325 MG per tablet Take 1 tablet  by mouth every 6 (six) hours as needed.  30 tablet  0  . levothyroxine (SYNTHROID, LEVOTHROID) 25 MCG tablet Take 25 mcg by mouth daily.      [name] lidocaine-prilocaine (EMLA) cream Apply topically as needed.  30 g  8  . liothyronine (CYTOMEL) 25 MCG tablet Take 12.5 mcg by mouth daily.      [name] LORazepam (ATIVAN) 0.5 MG tablet Take 1 tablet (0.5 mg total) by mouth every 6 (six) hours as needed (Nausea or vomiting).  30 tablet  0  . Multiple Vitamin (MULTIVITAMIN) tablet Take 1 tablet by mouth daily.       [name] omeprazole (PRILOSEC) 20 MG capsule Take 1 capsule (20 mg total) by mouth daily.  30 capsule  6  . ondansetron (ZOFRAN) 8 MG tablet Take 1 tablet (8 mg total) by mouth 2 (two) times daily as needed. Starting the third day after chemo  30 tablet  1  . oxyCODONE (OXY IR/ROXICODONE) 5 MG immediate release tablet       . prochlorperazine (COMPAZINE) 25 MG suppository Place 1 suppository (25 mg total) rectally every 12 (twelve) hours as needed for nausea.  12 suppository  3  . Sulfamethoxazole-Trimethoprim (BACTRIM PO) Take by mouth 2 (two) times daily.      [name] venlafaxine XR (EFFEXOR-XR) 75 MG 24 hr capsule Take 1 capsule (75 mg total) by mouth daily.  30 capsule  6  . prochlorperazine (COMPAZINE) 10 MG tablet Take 1 tablet (10 mg total) by mouth every 6 (six) hours as needed.  30 tablet  0  . valACYclovir (VALTREX) 500 MG tablet Take 1 tablet (500 mg total) by mouth 2 (two) times daily.  60 tablet  6   No current facility-administered medications for this visit.    SURGICAL HISTORY:  Past Surgical History  Procedure Laterality Date  . Esophagogastroduodenoscopy  2007    sprue  . Colonoscopy  2006  . Cryoblation of cervix    . Breast lumpectomy    . Total mastectomy Bilateral 09/11/2012    Procedure: bilateral MASTECTOMY;  Surgeon: Adolph Pollack, MD;  Location: [facility] OR;  Service: General;  Laterality: Bilateral;  . Axillary sentinel node biopsy Right 09/11/2012    Procedure: AXILLARY SENTINEL lymph NODE  BIOPSY;  Surgeon: Adolph Pollack, MD;  Location: [facility] OR;  Service: General;  Laterality: Right;  right nuclear medicine injection 12:30   . Breast reconstruction with placement of tissue expander and flex hd (acellular hydrated dermis) Bilateral 09/11/2012    Procedure: BREAST RECONSTRUCTION WITH PLACEMENT OF TISSUE EXPANDER AND FLEX HD (ACELLULAR HYDRATED DERMIS) ADM;  Surgeon: Wayland Denis, DO;  Location: [facility] OR;  Service: Plastics;  Laterality: Bilateral;  . Incision and drainage of  wound Left 09/16/2012    Procedure: Left Breast Evacuation of Hematoma;  Surgeon: Wayland Denis, DO;  Location: [facility] OR;  Service: Plastics;  Laterality: Left;  . Portacath placement Right 10/09/2012    Procedure: US GUIDED INSERTION PORT-Beard-CATH;  Surgeon: Adolph Pollack, MD;  Location:  SURGERY CENTER;  Service: General;  Laterality: Right;  Right Subclavian Vein    REVIEW OF SYSTEMS:   General: fatigue (-), night sweats (-), fever (-), pain (-) Lymph: palpable nodes (-) HEENT: vision changes (-), mucositis (-), gum bleeding (-), epistaxis (-) Cardiovascular: chest pain (-), palpitations (-) Pulmonary: shortness of breath (-), dyspnea on exertion (-), cough (-), hemoptysis (-) GI:  Early satiety (-), melena (-), dysphagia (-), nausea/vomiting (-), diarrhea (-) GU: dysuria (-), hematuria (-),  incontinence (-) Musculoskeletal: joint swelling (-), joint pain (-), back pain (-) Neuro: weakness (-), numbness (-), headache (-), confusion (-) Skin: Rash (-), lesions (-), dryness (-) Psych: depression (-), suicidal/homicidal ideation (-), feeling of hopelessness (-)   PHYSICAL EXAMINATION: Blood pressure 127/78, pulse 90, temperature 98.2 F (36.8 C), temperature source Oral, resp. rate 20, height 5\' 6"  (1.676 m), weight 136 lb 9.6 oz (61.961 kg), last menstrual period 09/11/2012. Body mass index is 22.06 kg/(m^2). General: Patient is Beard well appearing female in no acute distress HEENT: PERRLA, sclerae anicteric no conjunctival pallor, MMM Neck: supple, no palpable adenopathy Lungs: clear to auscultation bilaterally, no wheezes, rhonchi, or rales Cardiovascular: regular rate rhythm, S1, S2, no murmurs, rubs or gallops Abdomen: Soft, non-tender, non-distended, normoactive bowel sounds, no HSM Extremities: warm and well perfused, no clubbing, cyanosis, or edema Skin: No rashes or lesions Neuro: Non-focal Breasts: bilateral mastectomy sites are well healed, no nodularity, masses, or skin  changes, expanders in place. ECOG PERFORMANCE STATUS: 1 - Symptomatic but completely ambulatory      LABORATORY DATA: Lab Results  Component Value Date   WBC 11.3* 10/29/2012   HGB 11.5* 10/29/2012   HCT 35.2 10/29/2012   MCV 79.5 10/29/2012   PLT 363 10/29/2012      Chemistry      Component Value Date/Time   NA 139 10/29/2012 1306   NA 138 09/15/2012 2035   K 3.8 10/29/2012 1306   K 3.6 09/15/2012 2035   CL 104 10/29/2012 1306   CL 103 09/15/2012 2035   CO2 24 10/29/2012 1306   CO2 26 09/15/2012 2035   BUN 5.5* 10/29/2012 1306   BUN 4* 09/15/2012 2035   CREATININE 0.7 10/29/2012 1306   CREATININE 0.54 09/15/2012 2035      Component Value Date/Time   CALCIUM 9.1 10/29/2012 1306   CALCIUM 9.0 09/15/2012 2035   ALKPHOS 180* 10/29/2012 1306   ALKPHOS 142* 09/15/2012 2035   AST 28 10/29/2012 1306   AST 32 09/15/2012 2035   ALT 51 10/29/2012 1306   ALT 24 09/15/2012 2035   BILITOT <0.20 Repeated and Verified 10/29/2012 1306   BILITOT 0.8 09/15/2012 2035     ADDITIONAL INFORMATION: 3. CHROMOGENIC IN-SITU HYBRIDIZATION Interpretation HER-2/NEU BY CISH - NO AMPLIFICATION OF HER-2 DETECTED. THE RATIO OF HER-2: CEP 17 SIGNALS WAS 1.52. Reference range: Ratio: HER2:CEP17 < 1.8 - gene amplification not observed Ratio: HER2:CEP 17 1.8-2.2 - equivocal result Ratio: HER2:CEP17 > 2.2 - gene amplification observed Pecola Leisure MD Pathologist, Electronic Signature ( Signed 09/22/2012) 3. PROGNOSTIC INDICATORS - ACIS Results IMMUNOHISTOCHEMICAL AND MORPHOMETRIC ANALYSIS BY THE AUTOMATED CELLULAR IMAGING SYSTEM (ACIS) Estrogen Receptor (Negative, <1%): 2%, POSITIVE, WEAK STAINING INTENSITY Progesterone Receptor (Negative, <1%): 0%, NEGATIVE COMMENT: The negative hormone receptor study in this case has an internal positive control. All controls stained appropriately Pecola Leisure MD Pathologist, Electronic Signature ( Signed 09/22/2012) 1 of 4 FINAL for Droz, Nancy Beard [phone]) FINAL  DIAGNOSIS Diagnosis 1. Lymph node, sentinel, biopsy, Right axilla - ONE LYMPH NODE, NEGATIVE FOR TUMOR (0/1). 2. Breast, simple mastectomy, Left - BENIGN BREAST TISSUE WITH INTRADUCTAL PAPILLOMA, SEE COMMENT. - NEGATIVE FOR ATYPIA OR MALIGNANCY. - SURGICAL MARGINS, NEGATIVE FOR ATYPIA OR MALIGNANCY. - MICROCALCIFICATIONS IDENTIFIED. 3. Breast, simple mastectomy, Right - INVASIVE DUCTAL CARCINOMA, GRADE III, WITH SPINDLE CELL DIFFERENTIATION (METAPLASTIC CARCINOMA) (2.3CM), SEE COMMENT. - NO LYMPHOVASCULAR INVASION IDENTIFIED. - INVASIVE TUMOR IS 1.5 CM FROM NEAREST MARGIN (DEEP). - DUCTAL CARCINOMA IN SITU, GRADE III, WITH COMEDONECROSIS. - SEE  TUMOR SYNOPTIC TEMPLATE BELOW. Microscopic Comment 2. Although there was no mass grossly identified, there are definitive morphologic features of intraductal papilloma with associated fibrocystic change present. In addition, there are fibrocystic changes with usual ductal hyperplasia in representative sections not involved by papilloma. Finally, foci of sclerosing adenosis and microcalcifications in benign ducts and lobules are present. The surgical resection margin(s) of the specimen were inked and microscopically evaluated. 3. BREAST, INVASIVE TUMOR, WITH LYMPH NODE SAMPLING Specimen, including laterality: Right breast. Procedure: Simple mastectomy. Grade: III of III. Tubule formation: 3. Nuclear pleomorphism: 3. Mitotic: 2. Tumor size (gross measurement): 2.3 cm. Margins: Invasive, distance to closest margin: 1.5 cm. In-situ, distance to closest margin: 1.5 cm (deep). If margin positive, focally or broadly: N/Beard. Lymphovascular invasion: Absent. Ductal carcinoma in situ: Present. Grade: III of III. Extensive intraductal component: Absent. Lobular neoplasia: Absent. Tumor focality: Unifocal. Treatment effect: None. If present, treatment effect in breast tissue, lymph nodes or both: N/Beard. Extent of tumor: Skin and Nipple: Grossly  negative. Skeletal muscle: N/Beard. Lymph nodes: # examined: 1. Lymph nodes with metastasis: 0. Breast prognostic profile: Estrogen receptor: Repeated; previous study demonstrates 0% positivity ([id number]). Progesterone receptor: Repeated; previous study demonstrates 0% positivity ([id number]). HER-2/neu: Repeated; previous study demonstrated no amplification (1.55) ([id number]). 2 of 4 FINAL for Nancy Beard, Nancy Beard [phone]) Microscopic Comment(continued) Ki-67: Not repeated; previous study demonstrates 34% proliferation rate [phone]). Non-neoplastic breast: Intraductal papilloma with fibrocystic change and usual ductal hyperplasia, fibrocystic change with usual ductal hyperplasia, microcalcifications, and previous biopsy site. TNM: pT2, pN0, pMX. Comments: Although there are definitive features of high grade invasive ductal carcinoma identified, there are multiple foci where the carcinoma assumes Beard spindle cell morphology / spindle cell differentiation. 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 There are no heterologous elements identified. (CRR:eps 09/15/12)  RADIOGRAPHIC STUDIES:  Chest 2 View  09/08/2012  *RADIOLOGY REPORT*  Clinical Data: Right-sided breast  CHEST - 2 VIEW  Comparison: Chest x-ray 12/06/2009.  Findings: Lung volumes are normal.  No consolidative airspace disease.  No pleural effusions.  No pneumothorax.  No pulmonary nodule or mass noted.  Pulmonary vasculature and the cardiomediastinal silhouette are within normal limits.  Mild bilateral apical pleuroparenchymal thickening most compatible with chronic scarring, unchanged compared to the prior examination.  IMPRESSION: 1. No radiographic evidence of acute cardiopulmonary disease.   Original Report Authenticated By: Trudie Reed, M.D.    Nm Sentinel Node Inj-no Rpt (breast)  09/11/2012  CLINICAL DATA: Invasive right breast cancer    Sulfur colloid was injected intradermally by the nuclear medicine  technologist for breast cancer sentinel node localization.      ASSESSMENT: 57 year old female with  #1 invasive ductal carcinoma of the right breast status post bilateral mastectomies for Beard 2.3 cm node negative essentially triple negative disease on the right. Without any evidence of malignancy on the left. Patient's Beard candidate for adjuvant chemotherapy. We discussed Adriamycin Cytoxan dose dense followed by weekly Taxol Carbo. Since her tumor was 2% estrogen receptor positive we will utilize an aromatase inhibitor for 5 years. She understands the rationale for this.  #2 patient is now status post cycle 1 of Adriamycin Cytoxan given last week. Overall she has tolerated it well. She is neutropenic. I have recommended neutropenic precautions.  #3 patient is also having considerable anxiety I have recommended her taking Effexor.  #4 she will also begin Prilosec 20 mg daily for reflux.  PLAN:   #1 Doing  well.  Proceed with chemotherapy today.    #2 I refilled her Compazine and Zofran.  I also prescribed Valtrex BID for the fever blisters.    #3She will return in one week's time for labs and an appointment for evaluation of chemotoxicities.    All questions were answered. The patient knows to call the clinic with any problems, questions or concerns. We can certainly see the patient much sooner if necessary.  I spent 25 minutes counseling the patient face to face. The total time spent in the appointment was 30 minutes.  Cherie Ouch Lyn Hollingshead, NP Medical Oncology [facility] Phone: [phone] 10/31/2012, 8:04 AM

## 2012-10-29 NOTE — Progress Notes (Signed)
Positive blood return noted before, during, after adriamycin push per protocol.  SLJ

## 2012-10-29 NOTE — Patient Instructions (Addendum)
[facility] Discharge Instructions for Patients Receiving Chemotherapy  Today you received the following chemotherapy agents adriamycin, cytoxan  To help prevent nausea and vomiting after your treatment, we encourage you to take your nausea medication as needed   If you develop nausea and vomiting that is not controlled by your nausea medication, call the clinic. If it is after clinic hours your family physician or the after hours number for the clinic or go to the Emergency Department.   BELOW ARE SYMPTOMS THAT SHOULD BE REPORTED IMMEDIATELY:  *FEVER GREATER THAN 100.5 F  *CHILLS WITH OR WITHOUT FEVER  NAUSEA AND VOMITING THAT IS NOT CONTROLLED WITH YOUR NAUSEA MEDICATION  *UNUSUAL SHORTNESS OF BREATH  *UNUSUAL BRUISING OR BLEEDING  TENDERNESS IN MOUTH AND THROAT WITH OR WITHOUT PRESENCE OF ULCERS  *URINARY PROBLEMS  *BOWEL PROBLEMS  UNUSUAL RASH Items with * indicate a potential emergency and should be followed up as soon as possible.   The clinic phone number is [phone].   I have been informed and understand all the instructions given to me. I know to contact the clinic, my physician, or go to the Emergency Department if any problems should occur. I do not have any questions at this time, but understand that I may call the clinic during office hours   should I have any questions or need assistance in obtaining follow up care.    __________________________________________  _____________  __________ Signature of Patient or Authorized Representative            Date                   Time    __________________________________________ Nurse's Signature

## 2012-10-29 NOTE — Patient Instructions (Signed)
Doing well.  Proceed with chemotherapy.  Please call us if you have any questions or concerns.    

## 2012-10-30 ENCOUNTER — Ambulatory Visit (HOSPITAL_BASED_OUTPATIENT_CLINIC_OR_DEPARTMENT_OTHER): Payer: Private Health Insurance - Indemnity

## 2012-10-30 VITALS — BP 112/62 | HR 96 | Temp 98.6°F

## 2012-10-30 DIAGNOSIS — Z5189 Encounter for other specified aftercare: Secondary | ICD-10-CM

## 2012-10-30 DIAGNOSIS — C50319 Malignant neoplasm of lower-inner quadrant of unspecified female breast: Secondary | ICD-10-CM

## 2012-10-30 MED ORDER — PEGFILGRASTIM INJECTION 6 MG/0.6ML
6.0000 mg | Freq: Once | SUBCUTANEOUS | Status: AC
  Administered 2012-10-30: 6 mg via SUBCUTANEOUS
  Filled 2012-10-30: qty 0.6

## 2012-11-05 ENCOUNTER — Encounter: Payer: Self-pay | Admitting: Adult Health

## 2012-11-05 ENCOUNTER — Ambulatory Visit (HOSPITAL_BASED_OUTPATIENT_CLINIC_OR_DEPARTMENT_OTHER): Payer: Private Health Insurance - Indemnity | Admitting: Adult Health

## 2012-11-05 ENCOUNTER — Other Ambulatory Visit (HOSPITAL_BASED_OUTPATIENT_CLINIC_OR_DEPARTMENT_OTHER): Payer: Private Health Insurance - Indemnity | Admitting: Lab

## 2012-11-05 VITALS — BP 116/78 | HR 97 | Temp 98.0°F | Resp 20 | Ht 66.0 in | Wt 135.8 lb

## 2012-11-05 DIAGNOSIS — D702 Other drug-induced agranulocytosis: Secondary | ICD-10-CM

## 2012-11-05 DIAGNOSIS — C50319 Malignant neoplasm of lower-inner quadrant of unspecified female breast: Secondary | ICD-10-CM

## 2012-11-05 DIAGNOSIS — C50311 Malignant neoplasm of lower-inner quadrant of right female breast: Secondary | ICD-10-CM

## 2012-11-05 LAB — COMPREHENSIVE METABOLIC PANEL (CC13)
ALT: 60 U/L — ABNORMAL HIGH (ref 0–55)
AST: 42 U/L — ABNORMAL HIGH (ref 5–34)
Albumin: 3.5 g/dL (ref 3.5–5.0)
Alkaline Phosphatase: 153 U/L — ABNORMAL HIGH (ref 40–150)
BUN: 12.2 mg/dL (ref 7.0–26.0)
CO2: 23 mEq/L (ref 22–29)
Calcium: 9.1 mg/dL (ref 8.4–10.4)
Chloride: 104 mEq/L (ref 98–107)
Creatinine: 0.7 mg/dL (ref 0.6–1.1)
Glucose: 113 mg/dl — ABNORMAL HIGH (ref 70–99)
Potassium: 4.1 mEq/L (ref 3.5–5.1)
Sodium: 137 mEq/L (ref 136–145)
Total Bilirubin: 0.27 mg/dL (ref 0.20–1.20)
Total Protein: 6.9 g/dL (ref 6.4–8.3)

## 2012-11-05 LAB — CBC WITH DIFFERENTIAL/PLATELET
BASO%: 9.4 % — ABNORMAL HIGH (ref 0.0–2.0)
Basophils Absolute: 0.1 10*3/uL (ref 0.0–0.1)
EOS%: 1.6 % (ref 0.0–7.0)
Eosinophils Absolute: 0 10*3/uL (ref 0.0–0.5)
HCT: 32.4 % — ABNORMAL LOW (ref 34.8–46.6)
HGB: 10.7 g/dL — ABNORMAL LOW (ref 11.6–15.9)
LYMPH%: 81.3 % — ABNORMAL HIGH (ref 14.0–49.7)
MCH: 25.9 pg (ref 25.1–34.0)
MCHC: 33 g/dL (ref 31.5–36.0)
MCV: 78.5 fL — ABNORMAL LOW (ref 79.5–101.0)
MONO#: 0 10*3/uL — ABNORMAL LOW (ref 0.1–0.9)
MONO%: 3.1 % (ref 0.0–14.0)
NEUT#: 0 10*3/uL — CL (ref 1.5–6.5)
NEUT%: 4.6 % — ABNORMAL LOW (ref 38.4–76.8)
Platelets: 300 10*3/uL (ref 145–400)
RBC: 4.13 10*6/uL (ref 3.70–5.45)
RDW: 16.1 % — ABNORMAL HIGH (ref 11.2–14.5)
WBC: 0.6 10*3/uL — CL (ref 3.9–10.3)
lymph#: 0.5 10*3/uL — ABNORMAL LOW (ref 0.9–3.3)

## 2012-11-05 MED ORDER — CIPROFLOXACIN HCL 500 MG PO TABS: 500.0000 mg | ORAL_TABLET | Freq: Two times a day (BID) | ORAL | Status: DC

## 2012-11-05 NOTE — Progress Notes (Signed)
OFFICE PROGRESS NOTE  CC  Nancy Mew, MD [address] [place] [address] Dr. Lurline Hare  Dr. Avel Peace  DIAGNOSIS: 57 year old female with new diagnosis of stage II a breast cancer that is triple negative    STAGE:  Cancer of lower-inner quadrant of female breast  Primary site: Breast (Right)  Staging method: AJCC 7th Edition  Clinical: Stage IIA (T2, N0, cM0)  Summary: Stage IA (T2, N0, cM0)  PRIOR THERAPY: #1Patient's last mammogram was about 5 years ago. Recently she underwent a screening mammogram that showed a spiculated mass in the lower inner quadrant of the right breast. She had ultrasound performed that showed irregular mass at the 5:00 position 3 cm from the nipple measuring 1.1 cm. She had a needle core biopsy performed on 06/19/2012 the biopsy showed invasive ductal carcinoma with ductal carcinoma in situ grade 3 tumor was ER negative PR negative HER-2/neu negative. Ki-67 was elevated at 34%. She went on to have MRI of the breasts performed on 06/24/2012 the MRI showed in the middle third of the lower inner quadrant of the right breast a 1.5 x 1.3 x 1.9 cm irregular enhancing mass. No abnormal enhancement was seen in the left breast no enlarged axillary or internal mammary adenopathy was detected.   #2 patient is now status post bilateral mastectomies. On her right mastectomy she was found to have a 2.3 cm invasive ductal carcinoma 01 lymph nodes were positive for metastatic disease and this is T2 N0) stage II. Tumor was grade 3 ER 2% PR negative HER-2/neu negative with a Ki-67 at 34%. Left mastectomy only showed intraductal papilloma node atypia or malignancy was found.  #3 patient is now being considered for adjuvant chemotherapy since her tumor is essentially triple negative and is a stage II. We discussed the rationale for adjuvant chemotherapy. She will receive Adriamycin Cytoxan every 2 weeks for a total of 4 cycles with G-CSF support. Once  she completes this then she will receive Taxol and carboplatinum every week for a total of 12 weeks. I did discuss with her use of antiestrogen therapy such as an aromatase inhibitor since her tumor is 2% ER positive. I discussed the rationale for this.  CURRENT THERAPY:Cycle 2 day 8 Adriamycin Cytoxan with day 2 Neulasta.  INTERVAL HISTORY: Nancy Beard 57 y.o. female returns for followup of her right breast cancer.  She is doing well today.  She has had fatigue and mild nausea.  She likes the Zofran for the nausea because the Compazine does give her a "wired" feeling.  She noticed the back of her head has a rash like appearance she would like for me to look at.  She denies, fevers, chills, vomiting, constipation, numbness, or any further concerns.    MEDICAL HISTORY: Past Medical History  Diagnosis Date  . Thyroid disease   . Depression   . Sprue   . Diverticulosis   . Hyperlipidemia   . Breast cancer     right  . Hypothyroidism   . Pneumonia     ALLERGIES:  is allergic to gluten meal.  MEDICATIONS:  Current Outpatient Prescriptions  Medication Sig Dispense Refill  . acetaminophen (TYLENOL) 325 MG tablet Take 650 mg by mouth as needed.      [name] dexamethasone (DECADRON) 4 MG tablet Take 2 tablets by mouth once a day on the day after chemotherapy and then take 2 tablets two times a day for 2 days. Take with food.  30 tablet  1  .  HYDROcodone-acetaminophen (NORCO/VICODIN) 5-325 MG per tablet Take 1 tablet by mouth every 6 (six) hours as needed.  30 tablet  0  . levothyroxine (SYNTHROID, LEVOTHROID) 25 MCG tablet Take 25 mcg by mouth daily.      [name] lidocaine-prilocaine (EMLA) cream Apply topically as needed.  30 g  8  . liothyronine (CYTOMEL) 25 MCG tablet Take 12.5 mcg by mouth daily.      [name] LORazepam (ATIVAN) 0.5 MG tablet Take 1 tablet (0.5 mg total) by mouth every 6 (six) hours as needed (Nausea or vomiting).  30 tablet  0  . Multiple Vitamin (MULTIVITAMIN) tablet Take 1 tablet  by mouth daily.      [name] omeprazole (PRILOSEC) 20 MG capsule Take 1 capsule (20 mg total) by mouth daily.  30 capsule  6  . ondansetron (ZOFRAN) 8 MG tablet Take 1 tablet (8 mg total) by mouth 2 (two) times daily as needed. Starting the third day after chemo  30 tablet  1  . prochlorperazine (COMPAZINE) 10 MG tablet Take 1 tablet (10 mg total) by mouth every 6 (six) hours as needed.  30 tablet  0  . prochlorperazine (COMPAZINE) 25 MG suppository Place 1 suppository (25 mg total) rectally every 12 (twelve) hours as needed for nausea.  12 suppository  3  . Sulfamethoxazole-Trimethoprim (BACTRIM PO) Take by mouth 2 (two) times daily.      . valACYclovir (VALTREX) 500 MG tablet Take 1 tablet (500 mg total) by mouth 2 (two) times daily.  60 tablet  6  . venlafaxine XR (EFFEXOR-XR) 75 MG 24 hr capsule Take 1 capsule (75 mg total) by mouth daily.  30 capsule  6   No current facility-administered medications for this visit.    SURGICAL HISTORY:  Past Surgical History  Procedure Laterality Date  . Esophagogastroduodenoscopy  2007    sprue  . Colonoscopy  2006  . Cryoblation of cervix    . Breast lumpectomy    . Total mastectomy Bilateral 09/11/2012    Procedure: bilateral MASTECTOMY;  Surgeon: Adolph Pollack, MD;  Location: [facility] OR;  Service: General;  Laterality: Bilateral;  . Axillary sentinel node biopsy Right 09/11/2012    Procedure: AXILLARY SENTINEL lymph NODE  BIOPSY;  Surgeon: Adolph Pollack, MD;  Location: [facility] OR;  Service: General;  Laterality: Right;  right nuclear medicine injection 12:30   . Breast reconstruction with placement of tissue expander and flex hd (acellular hydrated dermis) Bilateral 09/11/2012    Procedure: BREAST RECONSTRUCTION WITH PLACEMENT OF TISSUE EXPANDER AND FLEX HD (ACELLULAR HYDRATED DERMIS) ADM;  Surgeon: Wayland Denis, DO;  Location: [facility] OR;  Service: Plastics;  Laterality: Bilateral;  . Incision and drainage of wound Left 09/16/2012    Procedure: Left Breast  Evacuation of Hematoma;  Surgeon: Wayland Denis, DO;  Location: [facility] OR;  Service: Plastics;  Laterality: Left;  . Portacath placement Right 10/09/2012    Procedure: US GUIDED INSERTION PORT-A-CATH;  Surgeon: Adolph Pollack, MD;  Location: [facility];  Service: General;  Laterality: Right;  Right Subclavian Vein    REVIEW OF SYSTEMS:   General: fatigue (+), night sweats (-), fever (-), pain (-) Lymph: palpable nodes (-) HEENT: vision changes (-), mucositis (-), gum bleeding (-), epistaxis (-) Cardiovascular: chest pain (-), palpitations (-) Pulmonary: shortness of breath (-), dyspnea on exertion (-), cough (-), hemoptysis (-) GI:  Early satiety (-), melena (-), dysphagia (-), nausea/vomiting (+), diarrhea (-) GU: dysuria (-), hematuria (-), incontinence (-) Musculoskeletal: joint swelling (-),  joint pain (-), back pain (-) Neuro: weakness (-), numbness (-), headache (-), confusion (-) Skin: Rash (-), lesions (-), dryness (-) Psych: depression (-), suicidal/homicidal ideation (-), feeling of hopelessness (-)   PHYSICAL EXAMINATION: Blood pressure 116/78, pulse 97, temperature 98 F (36.7 C), temperature source Oral, resp. rate 20, height 5\' 6"  (1.676 m), weight 135 lb 12.8 oz (61.598 kg), last menstrual period 09/11/2012. Body mass index is 21.93 kg/(m^2). General: Patient is a well appearing female in no acute distress HEENT: PERRLA, sclerae anicteric no conjunctival pallor, MMM Neck: supple, no palpable adenopathy Lungs: clear to auscultation bilaterally, no wheezes, rhonchi, or rales Cardiovascular: regular rate rhythm, S1, S2, no murmurs, rubs or gallops Abdomen: Soft, non-tender, non-distended, normoactive bowel sounds, no HSM Extremities: warm and well perfused, no clubbing, cyanosis, or edema Skin: No rashes or lesions, scalp erythema appears to be a birthmark Neuro: Non-focal Breasts: bilateral mastectomy sites are well healed, no nodularity, masses, or skin  changes, expanders in place. ECOG PERFORMANCE STATUS: 1 - Symptomatic but completely ambulatory  LABORATORY DATA: Lab Results  Component Value Date   WBC 0.6* 11/05/2012   HGB 10.7* 11/05/2012   HCT 32.4* 11/05/2012   MCV 78.5* 11/05/2012   PLT 300 11/05/2012      Chemistry      Component Value Date/Time   NA 137 11/05/2012 1014   NA 138 09/15/2012 2035   K 4.1 11/05/2012 1014   K 3.6 09/15/2012 2035   CL 104 11/05/2012 1014   CL 103 09/15/2012 2035   CO2 23 11/05/2012 1014   CO2 26 09/15/2012 2035   BUN 12.2 11/05/2012 1014   BUN 4* 09/15/2012 2035   CREATININE 0.7 11/05/2012 1014   CREATININE 0.54 09/15/2012 2035      Component Value Date/Time   CALCIUM 9.1 11/05/2012 1014   CALCIUM 9.0 09/15/2012 2035   ALKPHOS 153* 11/05/2012 1014   ALKPHOS 142* 09/15/2012 2035   AST 42* 11/05/2012 1014   AST 32 09/15/2012 2035   ALT 60* 11/05/2012 1014   ALT 24 09/15/2012 2035   BILITOT 0.27 11/05/2012 1014   BILITOT 0.8 09/15/2012 2035     ADDITIONAL INFORMATION: 3. CHROMOGENIC IN-SITU HYBRIDIZATION Interpretation HER-2/NEU BY CISH - NO AMPLIFICATION OF HER-2 DETECTED. THE RATIO OF HER-2: CEP 17 SIGNALS WAS 1.52. Reference range: Ratio: HER2:CEP17 < 1.8 - gene amplification not observed Ratio: HER2:CEP 17 1.8-2.2 - equivocal result Ratio: HER2:CEP17 > 2.2 - gene amplification observed Pecola Leisure MD Pathologist, Electronic Signature ( Signed 09/22/2012) 3. PROGNOSTIC INDICATORS - ACIS Results IMMUNOHISTOCHEMICAL AND MORPHOMETRIC ANALYSIS BY THE AUTOMATED CELLULAR IMAGING SYSTEM (ACIS) Estrogen Receptor (Negative, <1%): 2%, POSITIVE, WEAK STAINING INTENSITY Progesterone Receptor (Negative, <1%): 0%, NEGATIVE COMMENT: The negative hormone receptor study in this case has an internal positive control. All controls stained appropriately Pecola Leisure MD Pathologist, Electronic Signature ( Signed 09/22/2012) 1 of 4 FINAL for Coplen, Sherine A [phone]) FINAL DIAGNOSIS Diagnosis 1. Lymph node,  sentinel, biopsy, Right axilla - ONE LYMPH NODE, NEGATIVE FOR TUMOR (0/1). 2. Breast, simple mastectomy, Left - BENIGN BREAST TISSUE WITH INTRADUCTAL PAPILLOMA, SEE COMMENT. - NEGATIVE FOR ATYPIA OR MALIGNANCY. - SURGICAL MARGINS, NEGATIVE FOR ATYPIA OR MALIGNANCY. - MICROCALCIFICATIONS IDENTIFIED. 3. Breast, simple mastectomy, Right - INVASIVE DUCTAL CARCINOMA, GRADE III, WITH SPINDLE CELL DIFFERENTIATION (METAPLASTIC CARCINOMA) (2.3CM), SEE COMMENT. - NO LYMPHOVASCULAR INVASION IDENTIFIED. - INVASIVE TUMOR IS 1.5 CM FROM NEAREST MARGIN (DEEP). - DUCTAL CARCINOMA IN SITU, GRADE III, WITH COMEDONECROSIS. - SEE TUMOR SYNOPTIC TEMPLATE BELOW. Microscopic Comment  2. Although there was no mass grossly identified, there are definitive morphologic features of intraductal papilloma with associated fibrocystic change present. In addition, there are fibrocystic changes with usual ductal hyperplasia in representative sections not involved by papilloma. Finally, foci of sclerosing adenosis and microcalcifications in benign ducts and lobules are present. The surgical resection margin(s) of the specimen were inked and microscopically evaluated. 3. BREAST, INVASIVE TUMOR, WITH LYMPH NODE SAMPLING Specimen, including laterality: Right breast. Procedure: Simple mastectomy. Grade: III of III. Tubule formation: 3. Nuclear pleomorphism: 3. Mitotic: 2. Tumor size (gross measurement): 2.3 cm. Margins: Invasive, distance to closest margin: 1.5 cm. In-situ, distance to closest margin: 1.5 cm (deep). If margin positive, focally or broadly: N/A. Lymphovascular invasion: Absent. Ductal carcinoma in situ: Present. Grade: III of III. Extensive intraductal component: Absent. Lobular neoplasia: Absent. Tumor focality: Unifocal. Treatment effect: None. If present, treatment effect in breast tissue, lymph nodes or both: N/A. Extent of tumor: Skin and Nipple: Grossly negative. Skeletal muscle: N/A. Lymph  nodes: # examined: 1. Lymph nodes with metastasis: 0. Breast prognostic profile: Estrogen receptor: Repeated; previous study demonstrates 0% positivity ([id number]). Progesterone receptor: Repeated; previous study demonstrates 0% positivity ([id number]). HER-2/neu: Repeated; previous study demonstrated no amplification (1.55) ([id number]). 2 of 4 FINAL for COZY, VEALE A [phone]) Microscopic Comment(continued) Ki-67: Not repeated; previous study demonstrates 34% proliferation rate [phone]). Non-neoplastic breast: Intraductal papilloma with fibrocystic change and usual ductal hyperplasia, fibrocystic change with usual ductal hyperplasia, microcalcifications, and previous biopsy site. TNM: pT2, pN0, pMX. Comments: Although there are definitive features of high grade invasive ductal carcinoma identified, there are multiple foci where the carcinoma assumes a spindle cell morphology / spindle cell differentiation. 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 There are no heterologous elements identified. (CRR:eps 09/15/12)  RADIOGRAPHIC STUDIES:  Chest 2 View  09/08/2012  *RADIOLOGY REPORT*  Clinical Data: Right-sided breast  CHEST - 2 VIEW  Comparison: Chest x-ray 12/06/2009.  Findings: Lung volumes are normal.  No consolidative airspace disease.  No pleural effusions.  No pneumothorax.  No pulmonary nodule or mass noted.  Pulmonary vasculature and the cardiomediastinal silhouette are within normal limits.  Mild bilateral apical pleuroparenchymal thickening most compatible with chronic scarring, unchanged compared to the prior examination.  IMPRESSION: 1. No radiographic evidence of acute cardiopulmonary disease.   Original Report Authenticated By: Trudie Reed, M.D.    Nm Sentinel Node Inj-no Rpt (breast)  09/11/2012  CLINICAL DATA: Invasive right breast cancer   Sulfur colloid was injected  intradermally by the nuclear medicine  technologist for breast cancer sentinel node localization.      ASSESSMENT: 57 year old female with  #1 invasive ductal carcinoma of the right breast status post bilateral mastectomies for a 2.3 cm node negative essentially triple negative disease on the right. Without any evidence of malignancy on the left. Patient's a candidate for adjuvant chemotherapy. We discussed Adriamycin Cytoxan dose dense followed by weekly Taxol Carbo. Since her tumor was 2% estrogen receptor positive we will utilize an aromatase inhibitor for 5 years. She understands the rationale for this.  #2 patient is now status post cycle 1 of Adriamycin Cytoxan given last week. Overall she has tolerated it well. She is neutropenic. I have recommended neutropenic precautions.  #3 patient is also having considerable anxiety I have recommended her taking Effexor.  #4 she will also begin Prilosec 20 mg daily for reflux.  PLAN:   #1 Doing well. Ms. Monts is subsequently neutropenic  from the chemotherapy.  She will start Cipro BID.  I reviewed neutropenic precautions with her in detail and she verbalized understanding.    #2 She will return in one week's time for labs and an appointment for evaluation of chemotoxicities.    All questions were answered. The patient knows to call the clinic with any problems, questions or concerns. We can certainly see the patient much sooner if necessary.  I spent 25 minutes counseling the patient face to face. The total time spent in the appointment was 30 minutes.  Cherie Ouch Lyn Hollingshead, NP Medical Oncology [facility] Phone: [phone] 11/05/2012, 11:16 AM

## 2012-11-05 NOTE — Patient Instructions (Signed)
  Patient Neutropenia Instruction Sheet  Diagnosis: Breast Cancer      Treating Physician: Drue Second, MD  Treatment: 1. Type of chemotherapy: Adriamycin/Cytoxan 2. Date of last treatment: 11/05/12  Last Blood Counts: Lab Results  Component Value Date   WBC 0.6* 11/05/2012   HGB 10.7* 11/05/2012   HCT 32.4* 11/05/2012   MCV 78.5* 11/05/2012   PLT 300 11/05/2012  ANC 0      Prophylactic Antibiotics: Cipro 500 mg by mouth twice a day Instructions: 1. Monitor temperature and call if fever  greater than 100.5, chills, shaking chills (rigors) 2. Call Physician on-call at [phone] 3. Give him/her symptoms and list of medications that you are taking and your last blood count.

## 2012-11-07 ENCOUNTER — Other Ambulatory Visit: Payer: Self-pay | Admitting: *Deleted

## 2012-11-07 DIAGNOSIS — C50919 Malignant neoplasm of unspecified site of unspecified female breast: Secondary | ICD-10-CM

## 2012-11-07 MED ORDER — LEVOFLOXACIN 500 MG PO TABS: 500.0000 mg | ORAL_TABLET | Freq: Every day | ORAL | Status: DC

## 2012-11-07 NOTE — Telephone Encounter (Signed)
Received call from patient stating she had muscle spasms from the cipro.  She took two doses yesterday and this happened over night.  She also states she had some itching on her back but no visual rash.  Per Augustin Schooling, NP we will switch her to Levaquin 500mg  x 7days.  Pt. Aware and knows to call if any problems.

## 2012-11-12 ENCOUNTER — Other Ambulatory Visit (HOSPITAL_BASED_OUTPATIENT_CLINIC_OR_DEPARTMENT_OTHER): Payer: Private Health Insurance - Indemnity | Admitting: Lab

## 2012-11-12 ENCOUNTER — Ambulatory Visit (HOSPITAL_BASED_OUTPATIENT_CLINIC_OR_DEPARTMENT_OTHER): Payer: Private Health Insurance - Indemnity

## 2012-11-12 ENCOUNTER — Encounter: Payer: Self-pay | Admitting: Adult Health

## 2012-11-12 ENCOUNTER — Ambulatory Visit (HOSPITAL_BASED_OUTPATIENT_CLINIC_OR_DEPARTMENT_OTHER): Payer: Private Health Insurance - Indemnity | Admitting: Adult Health

## 2012-11-12 VITALS — BP 126/84 | HR 103 | Temp 98.0°F | Resp 20 | Ht 66.0 in | Wt 139.6 lb

## 2012-11-12 DIAGNOSIS — K219 Gastro-esophageal reflux disease without esophagitis: Secondary | ICD-10-CM

## 2012-11-12 DIAGNOSIS — Z5111 Encounter for antineoplastic chemotherapy: Secondary | ICD-10-CM

## 2012-11-12 DIAGNOSIS — F411 Generalized anxiety disorder: Secondary | ICD-10-CM

## 2012-11-12 DIAGNOSIS — C50319 Malignant neoplasm of lower-inner quadrant of unspecified female breast: Secondary | ICD-10-CM

## 2012-11-12 DIAGNOSIS — Z171 Estrogen receptor negative status [ER-]: Secondary | ICD-10-CM

## 2012-11-12 DIAGNOSIS — C50311 Malignant neoplasm of lower-inner quadrant of right female breast: Secondary | ICD-10-CM

## 2012-11-12 LAB — COMPREHENSIVE METABOLIC PANEL (CC13)
ALT: 122 U/L — ABNORMAL HIGH (ref 0–55)
AST: 62 U/L — ABNORMAL HIGH (ref 5–34)
Albumin: 3.3 g/dL — ABNORMAL LOW (ref 3.5–5.0)
Alkaline Phosphatase: 234 U/L — ABNORMAL HIGH (ref 40–150)
BUN: 8.8 mg/dL (ref 7.0–26.0)
CO2: 29 mEq/L (ref 22–29)
Calcium: 9.4 mg/dL (ref 8.4–10.4)
Chloride: 105 mEq/L (ref 98–107)
Creatinine: 0.6 mg/dL (ref 0.6–1.1)
Glucose: 111 mg/dl — ABNORMAL HIGH (ref 70–99)
Potassium: 3.8 mEq/L (ref 3.5–5.1)
Sodium: 142 mEq/L (ref 136–145)
Total Bilirubin: <0.2 mg/dL (ref 0.20–1.20)
Total Protein: 6.5 g/dL (ref 6.4–8.3)

## 2012-11-12 LAB — CBC WITH DIFFERENTIAL/PLATELET
BASO%: 0.4 % (ref 0.0–2.0)
Basophils Absolute: 0 10*3/uL (ref 0.0–0.1)
EOS%: 0 % (ref 0.0–7.0)
Eosinophils Absolute: 0 10*3/uL (ref 0.0–0.5)
HCT: 33.1 % — ABNORMAL LOW (ref 34.8–46.6)
HGB: 10.7 g/dL — ABNORMAL LOW (ref 11.6–15.9)
LYMPH%: 8.8 % — ABNORMAL LOW (ref 14.0–49.7)
MCH: 25.6 pg (ref 25.1–34.0)
MCHC: 32.2 g/dL (ref 31.5–36.0)
MCV: 79.6 fL (ref 79.5–101.0)
MONO#: 0.9 10*3/uL (ref 0.1–0.9)
MONO%: 7.8 % (ref 0.0–14.0)
NEUT#: 9.2 10*3/uL — ABNORMAL HIGH (ref 1.5–6.5)
NEUT%: 83 % — ABNORMAL HIGH (ref 38.4–76.8)
Platelets: 260 10*3/uL (ref 145–400)
RBC: 4.16 10*6/uL (ref 3.70–5.45)
RDW: 17.5 % — ABNORMAL HIGH (ref 11.2–14.5)
WBC: 11.1 10*3/uL — ABNORMAL HIGH (ref 3.9–10.3)
lymph#: 1 10*3/uL (ref 0.9–3.3)

## 2012-11-12 MED ORDER — DOXORUBICIN HCL CHEMO IV INJECTION 2 MG/ML
60.0000 mg/m2 | Freq: Once | INTRAVENOUS | Status: AC
  Administered 2012-11-12: 108 mg via INTRAVENOUS
  Filled 2012-11-12: qty 54

## 2012-11-12 MED ORDER — SODIUM CHLORIDE 0.9 % IV SOLN
Freq: Once | INTRAVENOUS | Status: AC
  Administered 2012-11-12: 15:00:00 via INTRAVENOUS

## 2012-11-12 MED ORDER — SODIUM CHLORIDE 0.9 % IV SOLN
150.0000 mg | Freq: Once | INTRAVENOUS | Status: AC
  Administered 2012-11-12: 150 mg via INTRAVENOUS
  Filled 2012-11-12: qty 5

## 2012-11-12 MED ORDER — SODIUM CHLORIDE 0.9 % IV SOLN
600.0000 mg/m2 | Freq: Once | INTRAVENOUS | Status: AC
  Administered 2012-11-12: 1080 mg via INTRAVENOUS
  Filled 2012-11-12: qty 54

## 2012-11-12 MED ORDER — PALONOSETRON HCL INJECTION 0.25 MG/5ML: 0.2500 mg | Freq: Once | INTRAVENOUS | Status: DC

## 2012-11-12 MED ORDER — DEXAMETHASONE SODIUM PHOSPHATE 20 MG/5ML IJ SOLN
12.0000 mg | Freq: Once | INTRAMUSCULAR | Status: AC
  Administered 2012-11-12: 12 mg via INTRAVENOUS
  Filled 2012-11-12: qty 5

## 2012-11-12 MED ORDER — LORAZEPAM 2 MG/ML IJ SOLN
0.5000 mg | Freq: Once | INTRAMUSCULAR | Status: AC
  Administered 2012-11-12: 15:00:00 via INTRAVENOUS

## 2012-11-12 MED ORDER — SODIUM CHLORIDE 0.9 % IJ SOLN
10.0000 mL | INTRAMUSCULAR | Status: DC | PRN
  Administered 2012-11-12: 10 mL
  Filled 2012-11-12: qty 10

## 2012-11-12 MED ORDER — HEPARIN SOD (PORK) LOCK FLUSH 100 UNIT/ML IV SOLN
500.0000 [IU] | Freq: Once | INTRAVENOUS | Status: AC | PRN
  Administered 2012-11-12: 500 [IU]
  Filled 2012-11-12: qty 5

## 2012-11-12 NOTE — Patient Instructions (Addendum)
Doing well.  Proceed with chemotherapy.  Please call us if you have any questions or concerns.    

## 2012-11-12 NOTE — Progress Notes (Signed)
OFFICE PROGRESS NOTE  CC  Nancy Mew, MD [address] [place] [address] Dr. Lurline Hare  Dr. Avel Peace  DIAGNOSIS: 57 year old female with new diagnosis of stage II Beard breast cancer that is triple negative    STAGE:  Cancer of lower-inner quadrant of female breast  Primary site: Breast (Right)  Staging method: AJCC 7th Edition  Clinical: Stage IIA (T2, N0, cM0)  Summary: Stage IA (T2, N0, cM0)  PRIOR THERAPY: #1Patient's last mammogram was about 5 years ago. Recently she underwent Beard screening mammogram that showed Beard spiculated mass in the lower inner quadrant of the right breast. She had ultrasound performed that showed irregular mass at the 5:00 position 3 cm from the nipple measuring 1.1 cm. She had Beard needle core biopsy performed on 06/19/2012 the biopsy showed invasive ductal carcinoma with ductal carcinoma in situ grade 3 tumor was ER negative PR negative HER-2/neu negative. Ki-67 was elevated at 34%. She went on to have MRI of the breasts performed on 06/24/2012 the MRI showed in the middle third of the lower inner quadrant of the right breast Beard 1.5 x 1.3 x 1.9 cm irregular enhancing mass. No abnormal enhancement was seen in the left breast no enlarged axillary or internal mammary adenopathy was detected.   #2 patient is now status post bilateral mastectomies. On her right mastectomy she was found to have Beard 2.3 cm invasive ductal carcinoma 01 lymph nodes were positive for metastatic disease and this is T2 N0) stage II. Tumor was grade 3 ER 2% PR negative HER-2/neu negative with Beard Ki-67 at 34%. Left mastectomy only showed intraductal papilloma node atypia or malignancy was found.  #3 patient is now being considered for adjuvant chemotherapy since her tumor is essentially triple negative and is Beard stage II. We discussed the rationale for adjuvant chemotherapy. She will receive Adriamycin Cytoxan every 2 weeks for Beard total of 4 cycles with G-CSF support. Once  she completes this then she will receive Taxol and carboplatinum every week for Beard total of 12 weeks. I did discuss with her use of antiestrogen therapy such as an aromatase inhibitor since her tumor is 2% ER positive. I discussed the rationale for this.  CURRENT THERAPY:Cycle 3 day 1 Adriamycin Cytoxan with day 2 Neulasta.  INTERVAL HISTORY: Nancy Beard 57 y.o. female returns for followup of her right breast cancer.  She is doing well today.  She was neutropenic last week, took Cipro developed muscle spasms, and we switched her to Levaquin.  She tolerated her Levaquin without difficulty.  She is feeling well today and Beard 10 point ROS is neg.     MEDICAL HISTORY: Past Medical History  Diagnosis Date  . Thyroid disease   . Depression   . Sprue   . Diverticulosis   . Hyperlipidemia   . Breast cancer     right  . Hypothyroidism   . Pneumonia     ALLERGIES:  is allergic to ciprofloxacin and gluten meal.  MEDICATIONS:  Current Outpatient Prescriptions  Medication Sig Dispense Refill  . acetaminophen (TYLENOL) 325 MG tablet Take 650 mg by mouth as needed.      [name] dexamethasone (DECADRON) 4 MG tablet Take 2 tablets by mouth once Beard day on the day after chemotherapy and then take 2 tablets two times Beard day for 2 days. Take with food.  30 tablet  1  . HYDROcodone-acetaminophen (NORCO/VICODIN) 5-325 MG per tablet Take 1 tablet by mouth every 6 (six) hours as  needed.  30 tablet  0  . levofloxacin (LEVAQUIN) 500 MG tablet Take 1 tablet (500 mg total) by mouth daily.  7 tablet  0  . levothyroxine (SYNTHROID, LEVOTHROID) 25 MCG tablet Take 25 mcg by mouth daily.      [name] lidocaine-prilocaine (EMLA) cream Apply topically as needed.  30 g  8  . liothyronine (CYTOMEL) 25 MCG tablet Take 12.5 mcg by mouth daily.      [name] LORazepam (ATIVAN) 0.5 MG tablet Take 1 tablet (0.5 mg total) by mouth every 6 (six) hours as needed (Nausea or vomiting).  30 tablet  0  . Multiple Vitamin (MULTIVITAMIN) tablet Take 1  tablet by mouth daily.      [name] omeprazole (PRILOSEC) 20 MG capsule Take 1 capsule (20 mg total) by mouth daily.  30 capsule  6  . ondansetron (ZOFRAN) 8 MG tablet Take 1 tablet (8 mg total) by mouth 2 (two) times daily as needed. Starting the third day after chemo  30 tablet  1  . prochlorperazine (COMPAZINE) 10 MG tablet Take 1 tablet (10 mg total) by mouth every 6 (six) hours as needed.  30 tablet  0  . prochlorperazine (COMPAZINE) 25 MG suppository Place 1 suppository (25 mg total) rectally every 12 (twelve) hours as needed for nausea.  12 suppository  3  . Sulfamethoxazole-Trimethoprim (BACTRIM PO) Take by mouth 2 (two) times daily.      . valACYclovir (VALTREX) 500 MG tablet Take 1 tablet (500 mg total) by mouth 2 (two) times daily.  60 tablet  6  . venlafaxine XR (EFFEXOR-XR) 75 MG 24 hr capsule Take 1 capsule (75 mg total) by mouth daily.  30 capsule  6   No current facility-administered medications for this visit.    SURGICAL HISTORY:  Past Surgical History  Procedure Laterality Date  . Esophagogastroduodenoscopy  2007    sprue  . Colonoscopy  2006  . Cryoblation of cervix    . Breast lumpectomy    . Total mastectomy Bilateral 09/11/2012    Procedure: bilateral MASTECTOMY;  Surgeon: Adolph Pollack, MD;  Location[facility] OR;  Service: General;  Laterality: Bilateral;  . Axillary sentinel node biopsy Right 09/11/2012    Procedure: AXILLARY SENTINEL lymph NODE  BIOPSY;  Surgeon: Adolph Pollack, MD;  Location: [facility] OR;  Service: General;  Laterality: Right;  right nuclear medicine injection 12:30   . Breast reconstruction with placement of tissue expander and flex hd (acellular hydrated dermis) Bilateral 09/11/2012    Procedure: BREAST RECONSTRUCTION WITH PLACEMENT OF TISSUE EXPANDER AND FLEX HD (ACELLULAR HYDRATED DERMIS) ADM;  Surgeon: Wayland Denis, DO;  Location: [facility] OR;  Service: Plastics;  Laterality: Bilateral;  . Incision and drainage of wound Left 09/16/2012    Procedure: Left Breast  Evacuation of Hematoma;  Surgeon: Wayland Denis, DO;  Location[facility] OR;  Service: Plastics;  Laterality: Left;  . Portacath placement Right 10/09/2012    Procedure: US GUIDED INSERTION PORT-Beard-CATH;  Surgeon: Adolph Pollack, MD;  Location: [facility];  Service: General;  Laterality: Right;  Right Subclavian Vein    REVIEW OF SYSTEMS:   General: fatigue (+), night sweats (-), fever (-), pain (-) Lymph: palpable nodes (-) HEENT: vision changes (-), mucositis (-), gum bleeding (-), epistaxis (-) Cardiovascular: chest pain (-), palpitations (-) Pulmonary: shortness of breath (-), dyspnea on exertion (-), cough (-), hemoptysis (-) GI:  Early satiety (-), melena (-), dysphagia (-), nausea/vomiting (-), diarrhea (-) GU: dysuria (-), hematuria (-),  incontinence (-) Musculoskeletal: joint swelling (-), joint pain (-), back pain (-) Neuro: weakness (-), numbness (-), headache (-), confusion (-) Skin: Rash (-), lesions (-), dryness (-) Psych: depression (-), suicidal/homicidal ideation (-), feeling of hopelessness (-)   PHYSICAL EXAMINATION: Blood pressure 126/84, pulse 103, temperature 98 F (36.7 C), temperature source Oral, resp. rate 20, height 5\' 6"  (1.676 m), weight 139 lb 9.6 oz (63.322 kg), last menstrual period 09/11/2012. Body mass index is 22.54 kg/(m^2). General: Patient is Beard well appearing female in no acute distress HEENT: PERRLA, sclerae anicteric no conjunctival pallor, MMM Neck: supple, no palpable adenopathy Lungs: clear to auscultation bilaterally, no wheezes, rhonchi, or rales Cardiovascular: regular rate rhythm, S1, S2, no murmurs, rubs or gallops Abdomen: Soft, non-tender, non-distended, normoactive bowel sounds, no HSM Extremities: warm and well perfused, no clubbing, cyanosis, or edema Skin: No rashes or lesions, scalp erythema appears to be Beard birthmark Neuro: Non-focal Breasts: bilateral mastectomy sites are well healed, no nodularity, masses, or skin  changes, expanders in place. ECOG PERFORMANCE STATUS: 1 - Symptomatic but completely ambulatory  LABORATORY DATA: Lab Results  Component Value Date   WBC 11.1* 11/12/2012   HGB 10.7* 11/12/2012   HCT 33.1* 11/12/2012   MCV 79.6 11/12/2012   PLT 260 11/12/2012      Chemistry      Component Value Date/Time   NA 142 11/12/2012 1314   NA 138 09/15/2012 2035   K 3.8 11/12/2012 1314   K 3.6 09/15/2012 2035   CL 105 11/12/2012 1314   CL 103 09/15/2012 2035   CO2 29 11/12/2012 1314   CO2 26 09/15/2012 2035   BUN 8.8 11/12/2012 1314   BUN 4* 09/15/2012 2035   CREATININE 0.6 11/12/2012 1314   CREATININE 0.54 09/15/2012 2035      Component Value Date/Time   CALCIUM 9.4 11/12/2012 1314   CALCIUM 9.0 09/15/2012 2035   ALKPHOS 234* 11/12/2012 1314   ALKPHOS 142* 09/15/2012 2035   AST 62* 11/12/2012 1314   AST 32 09/15/2012 2035   ALT 122* 11/12/2012 1314   ALT 24 09/15/2012 2035   BILITOT <0.20 Repeated and Verified 11/12/2012 1314   BILITOT 0.8 09/15/2012 2035     ADDITIONAL INFORMATION: 3. CHROMOGENIC IN-SITU HYBRIDIZATION Interpretation HER-2/NEU BY CISH - NO AMPLIFICATION OF HER-2 DETECTED. THE RATIO OF HER-2: CEP 17 SIGNALS WAS 1.52. Reference range: Ratio: HER2:CEP17 < 1.8 - gene amplification not observed Ratio: HER2:CEP 17 1.8-2.2 - equivocal result Ratio: HER2:CEP17 > 2.2 - gene amplification observed Pecola Leisure MD Pathologist, Electronic Signature ( Signed 09/22/2012) 3. PROGNOSTIC INDICATORS - ACIS Results IMMUNOHISTOCHEMICAL AND MORPHOMETRIC ANALYSIS BY THE AUTOMATED CELLULAR IMAGING SYSTEM (ACIS) Estrogen Receptor (Negative, <1%): 2%, POSITIVE, WEAK STAINING INTENSITY Progesterone Receptor (Negative, <1%): 0%, NEGATIVE COMMENT: The negative hormone receptor study in this case has an internal positive control. All controls stained appropriately Pecola Leisure MD Pathologist, Electronic Signature ( Signed 09/22/2012) 1 of 4 FINAL for Sprick, Nancy Beard [phone]) FINAL  DIAGNOSIS Diagnosis 1. Lymph node, sentinel, biopsy, Right axilla - ONE LYMPH NODE, NEGATIVE FOR TUMOR (0/1). 2. Breast, simple mastectomy, Left - BENIGN BREAST TISSUE WITH INTRADUCTAL PAPILLOMA, SEE COMMENT. - NEGATIVE FOR ATYPIA OR MALIGNANCY. - SURGICAL MARGINS, NEGATIVE FOR ATYPIA OR MALIGNANCY. - MICROCALCIFICATIONS IDENTIFIED. 3. Breast, simple mastectomy, Right - INVASIVE DUCTAL CARCINOMA, GRADE III, WITH SPINDLE CELL DIFFERENTIATION (METAPLASTIC CARCINOMA) (2.3CM), SEE COMMENT. - NO LYMPHOVASCULAR INVASION IDENTIFIED. - INVASIVE TUMOR IS 1.5 CM FROM NEAREST MARGIN (DEEP). - DUCTAL CARCINOMA IN SITU, GRADE III, WITH  COMEDONECROSIS. - SEE TUMOR SYNOPTIC TEMPLATE BELOW. Microscopic Comment 2. Although there was no mass grossly identified, there are definitive morphologic features of intraductal papilloma with associated fibrocystic change present. In addition, there are fibrocystic changes with usual ductal hyperplasia in representative sections not involved by papilloma. Finally, foci of sclerosing adenosis and microcalcifications in benign ducts and lobules are present. The surgical resection margin(s) of the specimen were inked and microscopically evaluated. 3. BREAST, INVASIVE TUMOR, WITH LYMPH NODE SAMPLING Specimen, including laterality: Right breast. Procedure: Simple mastectomy. Grade: III of III. Tubule formation: 3. Nuclear pleomorphism: 3. Mitotic: 2. Tumor size (gross measurement): 2.3 cm. Margins: Invasive, distance to closest margin: 1.5 cm. In-situ, distance to closest margin: 1.5 cm (deep). If margin positive, focally or broadly: N/Beard. Lymphovascular invasion: Absent. Ductal carcinoma in situ: Present. Grade: III of III. Extensive intraductal component: Absent. Lobular neoplasia: Absent. Tumor focality: Unifocal. Treatment effect: None. If present, treatment effect in breast tissue, lymph nodes or both: N/Beard. Extent of tumor: Skin and Nipple: Grossly  negative. Skeletal muscle: N/Beard. Lymph nodes: # examined: 1. Lymph nodes with metastasis: 0. Breast prognostic profile: Estrogen receptor: Repeated; previous study demonstrates 0% positivity ([id number]). Progesterone receptor: Repeated; previous study demonstrates 0% positivity ([id number]). HER-2/neu: Repeated; previous study demonstrated no amplification (1.55) ([id number]). 2 of 4 FINAL for DEXTER, SAUSER Beard [phone]) Microscopic Comment(continued) Ki-67: Not repeated; previous study demonstrates 34% proliferation rate [phone]). Non-neoplastic breast: Intraductal papilloma with fibrocystic change and usual ductal hyperplasia, fibrocystic change with usual ductal hyperplasia, microcalcifications, and previous biopsy site. TNM: pT2, pN0, pMX. Comments: Although there are definitive features of high grade invasive ductal carcinoma identified, there are multiple foci where the carcinoma assumes Beard spindle cell morphology / spindle cell differentiation. 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 There are no heterologous elements identified. (CRR:eps 09/15/12)  RADIOGRAPHIC STUDIES:  Chest 2 View  09/08/2012  *RADIOLOGY REPORT*  Clinical Data: Right-sided breast  CHEST - 2 VIEW  Comparison: Chest x-ray 12/06/2009.  Findings: Lung volumes are normal.  No consolidative airspace disease.  No pleural effusions.  No pneumothorax.  No pulmonary nodule or mass noted.  Pulmonary vasculature and the cardiomediastinal silhouette are within normal limits.  Mild bilateral apical pleuroparenchymal thickening most compatible with chronic scarring, unchanged compared to the prior examination.  IMPRESSION: 1. No radiographic evidence of acute cardiopulmonary disease.   Original Report Authenticated By: Trudie Reed, M.D.    Nm Sentinel Node Inj-no Rpt (breast)  09/11/2012  CLINICAL DATA: Invasive right breast cancer    Sulfur colloid was injected intradermally by the nuclear medicine  technologist for breast cancer sentinel node localization.      ASSESSMENT: 57 year old female with  #1 invasive ductal carcinoma of the right breast status post bilateral mastectomies for Beard 2.3 cm node negative essentially triple negative disease on the right. Without any evidence of malignancy on the left. Patient's Beard candidate for adjuvant chemotherapy. We discussed Adriamycin Cytoxan dose dense followed by weekly Taxol Carbo. Since her tumor was 2% estrogen receptor positive we will utilize an aromatase inhibitor for 5 years. She understands the rationale for this.  #2 patient is now status post cycle 1 of Adriamycin Cytoxan given last week. Overall she has tolerated it well. She is neutropenic. I have recommended neutropenic precautions.  #3 patient is also having considerable anxiety I have recommended her taking Effexor.  #4 she will also begin Prilosec 20 mg daily for reflux.  PLAN:   #  1 Doing well. Ms. Trzcinski labs have recovered.  She will proceed with chemotherapy today.    #2 She will return in one week's time for labs and an appointment for evaluation of chemotoxicities.    All questions were answered. The patient knows to call the clinic with any problems, questions or concerns. We can certainly see the patient much sooner if necessary.  I spent 25 minutes counseling the patient face to face. The total time spent in the appointment was 30 minutes.  Cherie Ouch Lyn Hollingshead, NP Medical Oncology [facility] Phone: [phone] 11/13/2012, 5:26 PM

## 2012-11-13 ENCOUNTER — Ambulatory Visit (HOSPITAL_BASED_OUTPATIENT_CLINIC_OR_DEPARTMENT_OTHER): Payer: Private Health Insurance - Indemnity

## 2012-11-13 VITALS — BP 122/73 | HR 92 | Temp 98.5°F

## 2012-11-13 DIAGNOSIS — C50319 Malignant neoplasm of lower-inner quadrant of unspecified female breast: Secondary | ICD-10-CM

## 2012-11-13 DIAGNOSIS — Z5189 Encounter for other specified aftercare: Secondary | ICD-10-CM

## 2012-11-13 MED ORDER — PEGFILGRASTIM INJECTION 6 MG/0.6ML
6.0000 mg | Freq: Once | SUBCUTANEOUS | Status: AC
  Administered 2012-11-13: 6 mg via SUBCUTANEOUS
  Filled 2012-11-13: qty 0.6

## 2012-11-19 ENCOUNTER — Other Ambulatory Visit (HOSPITAL_BASED_OUTPATIENT_CLINIC_OR_DEPARTMENT_OTHER): Payer: Private Health Insurance - Indemnity | Admitting: Lab

## 2012-11-19 ENCOUNTER — Encounter: Payer: Self-pay | Admitting: Adult Health

## 2012-11-19 ENCOUNTER — Telehealth: Payer: Self-pay | Admitting: *Deleted

## 2012-11-19 ENCOUNTER — Ambulatory Visit (HOSPITAL_BASED_OUTPATIENT_CLINIC_OR_DEPARTMENT_OTHER): Payer: Private Health Insurance - Indemnity | Admitting: Adult Health

## 2012-11-19 VITALS — BP 112/77 | HR 130 | Temp 98.8°F | Resp 20 | Ht 65.0 in | Wt 138.4 lb

## 2012-11-19 DIAGNOSIS — C50319 Malignant neoplasm of lower-inner quadrant of unspecified female breast: Secondary | ICD-10-CM

## 2012-11-19 DIAGNOSIS — C50919 Malignant neoplasm of unspecified site of unspecified female breast: Secondary | ICD-10-CM

## 2012-11-19 DIAGNOSIS — C50311 Malignant neoplasm of lower-inner quadrant of right female breast: Secondary | ICD-10-CM

## 2012-11-19 DIAGNOSIS — D709 Neutropenia, unspecified: Secondary | ICD-10-CM

## 2012-11-19 LAB — COMPREHENSIVE METABOLIC PANEL (CC13)
ALT: 180 U/L — ABNORMAL HIGH (ref 0–55)
AST: 86 U/L — ABNORMAL HIGH (ref 5–34)
Albumin: 3.4 g/dL — ABNORMAL LOW (ref 3.5–5.0)
Alkaline Phosphatase: 224 U/L — ABNORMAL HIGH (ref 40–150)
BUN: 16.6 mg/dL (ref 7.0–26.0)
CO2: 23 mEq/L (ref 22–29)
Calcium: 9 mg/dL (ref 8.4–10.4)
Chloride: 103 mEq/L (ref 98–107)
Creatinine: 0.6 mg/dL (ref 0.6–1.1)
Glucose: 115 mg/dl — ABNORMAL HIGH (ref 70–99)
Potassium: 4.1 mEq/L (ref 3.5–5.1)
Sodium: 135 mEq/L — ABNORMAL LOW (ref 136–145)
Total Bilirubin: 0.42 mg/dL (ref 0.20–1.20)
Total Protein: 6.8 g/dL (ref 6.4–8.3)

## 2012-11-19 LAB — CBC WITH DIFFERENTIAL/PLATELET
BASO%: 3.2 % — ABNORMAL HIGH (ref 0.0–2.0)
Basophils Absolute: 0 10*3/uL (ref 0.0–0.1)
EOS%: 7.9 % — ABNORMAL HIGH (ref 0.0–7.0)
Eosinophils Absolute: 0 10*3/uL (ref 0.0–0.5)
HCT: 31.5 % — ABNORMAL LOW (ref 34.8–46.6)
HGB: 10.2 g/dL — ABNORMAL LOW (ref 11.6–15.9)
LYMPH%: 77 % — ABNORMAL HIGH (ref 14.0–49.7)
MCH: 25.8 pg (ref 25.1–34.0)
MCHC: 32.5 g/dL (ref 31.5–36.0)
MCV: 79.5 fL (ref 79.5–101.0)
MONO#: 0 10*3/uL — ABNORMAL LOW (ref 0.1–0.9)
MONO%: 0.9 % (ref 0.0–14.0)
NEUT#: 0 10*3/uL — CL (ref 1.5–6.5)
NEUT%: 11 % — ABNORMAL LOW (ref 38.4–76.8)
Platelets: 145 10*3/uL (ref 145–400)
RBC: 3.97 10*6/uL (ref 3.70–5.45)
RDW: 17.2 % — ABNORMAL HIGH (ref 11.2–14.5)
WBC: 0.4 10*3/uL — CL (ref 3.9–10.3)
lymph#: 0.3 10*3/uL — ABNORMAL LOW (ref 0.9–3.3)

## 2012-11-19 MED ORDER — LEVOFLOXACIN 500 MG PO TABS: 500.0000 mg | ORAL_TABLET | Freq: Every day | ORAL | Status: DC

## 2012-11-19 NOTE — Telephone Encounter (Signed)
appts made and printed for lab and LA. Pt is aware that tx will follow she stated that she will pickup a new schedule on 11/26/12. i also emailed MB to add tx...td

## 2012-11-19 NOTE — Patient Instructions (Signed)
  Patient Neutropenia Instruction Sheet  Diagnosis: Breast Cancer      Treating Physician: Drue Second, MD  Treatment: 1. Type of chemotherapy: Adriamycin Cytoxan 2. Date of last treatment: 11/12/12  Last Blood Counts: Lab Results  Component Value Date   WBC 0.4* 11/19/2012   HGB 10.2* 11/19/2012   HCT 31.5* 11/19/2012   MCV 79.5 11/19/2012   PLT 145 11/19/2012  ANC 0      Prophylactic Antibiotics: Cipro 500 mg by mouth twice a day Instructions: 1. Monitor temperature and call if fever  greater than 100.5, chills, shaking chills (rigors) 2. Call Physician on-call at [phone] 3. Give him/her symptoms and list of medications that you are taking and your last blood count.

## 2012-11-19 NOTE — Progress Notes (Addendum)
OFFICE PROGRESS NOTE  CC  Nancy Mew, MD [address] [place] [address] Dr. Lurline Hare  Dr. Avel Peace  DIAGNOSIS: 57 year old female with new diagnosis of stage II a breast cancer that is triple negative    STAGE:  Cancer of lower-inner quadrant of female breast  Primary site: Breast (Right)  Staging method: AJCC 7th Edition  Clinical: Stage IIA (T2, N0, cM0)  Summary: Stage IA (T2, N0, cM0)  PRIOR THERAPY: #1Patient's last mammogram was about 5 years ago. Recently she underwent a screening mammogram that showed a spiculated mass in the lower inner quadrant of the right breast. She had ultrasound performed that showed irregular mass at the 5:00 position 3 cm from the nipple measuring 1.1 cm. She had a needle core biopsy performed on 06/19/2012 the biopsy showed invasive ductal carcinoma with ductal carcinoma in situ grade 3 tumor was ER negative PR negative HER-2/neu negative. Ki-67 was elevated at 34%. She went on to have MRI of the breasts performed on 06/24/2012 the MRI showed in the middle third of the lower inner quadrant of the right breast a 1.5 x 1.3 x 1.9 cm irregular enhancing mass. No abnormal enhancement was seen in the left breast no enlarged axillary or internal mammary adenopathy was detected.   #2 patient is now status post bilateral mastectomies. On her right mastectomy she was found to have a 2.3 cm invasive ductal carcinoma 01 lymph nodes were positive for metastatic disease and this is T2 N0) stage II. Tumor was grade 3 ER 2% PR negative HER-2/neu negative with a Ki-67 at 34%. Left mastectomy only showed intraductal papilloma node atypia or malignancy was found.  #3 patient is now being considered for adjuvant chemotherapy since her tumor is essentially triple negative and is a stage II. We discussed the rationale for adjuvant chemotherapy. She will receive Adriamycin Cytoxan every 2 weeks for a total of 4 cycles with G-CSF support. Once  she completes this then she will receive Taxol and carboplatinum every week for a total of 12 weeks. I did discuss with her use of antiestrogen therapy such as an aromatase inhibitor since her tumor is 2% ER positive. I discussed the rationale for this.  CURRENT THERAPY:Cycle 3 day 8 Adriamycin Cytoxan with day 2 Neulasta.  INTERVAL HISTORY: Nancy Beard 57 y.o. female returns for followup of her right breast cancer.  She is doing well today.  She underwent chemotherapy this week and did relatively well.  She is fatigued.  She had f/u with Dr. Kelly Splinter this past Friday for a fill of her expanders.  The patient was placed on Levaquin for some erythema along the left breast incision line.  There is no tenderness or warmth.  She denies fevers, chills, nausea, vomiting, constipation, diarrhea, numbness or any further concerns.   MEDICAL HISTORY: Past Medical History  Diagnosis Date  . Thyroid disease   . Depression   . Sprue   . Diverticulosis   . Hyperlipidemia   . Breast cancer     right  . Hypothyroidism   . Pneumonia     ALLERGIES:  is allergic to ciprofloxacin and gluten meal.  MEDICATIONS:  Current Outpatient Prescriptions  Medication Sig Dispense Refill  . acetaminophen (TYLENOL) 325 MG tablet Take 650 mg by mouth as needed.      [name] dexamethasone (DECADRON) 4 MG tablet Take 2 tablets by mouth once a day on the day after chemotherapy and then take 2 tablets two times a day for  2 days. Take with food.  30 tablet  1  . HYDROcodone-acetaminophen (NORCO/VICODIN) 5-325 MG per tablet Take 1 tablet by mouth every 6 (six) hours as needed.  30 tablet  0  . levofloxacin (LEVAQUIN) 500 MG tablet Take 1 tablet (500 mg total) by mouth daily.  7 tablet  2  . levothyroxine (SYNTHROID, LEVOTHROID) 25 MCG tablet Take 25 mcg by mouth daily.      [name] lidocaine-prilocaine (EMLA) cream Apply topically as needed.  30 g  8  . liothyronine (CYTOMEL) 25 MCG tablet Take 12.5 mcg by mouth daily.      [name]  LORazepam (ATIVAN) 0.5 MG tablet Take 1 tablet (0.5 mg total) by mouth every 6 (six) hours as needed (Nausea or vomiting).  30 tablet  0  . Multiple Vitamin (MULTIVITAMIN) tablet Take 1 tablet by mouth daily.      [name] omeprazole (PRILOSEC) 20 MG capsule Take 1 capsule (20 mg total) by mouth daily.  30 capsule  6  . ondansetron (ZOFRAN) 8 MG tablet Take 1 tablet (8 mg total) by mouth 2 (two) times daily as needed. Starting the third day after chemo  30 tablet  1  . prochlorperazine (COMPAZINE) 10 MG tablet Take 1 tablet (10 mg total) by mouth every 6 (six) hours as needed.  30 tablet  0  . prochlorperazine (COMPAZINE) 25 MG suppository Place 1 suppository (25 mg total) rectally every 12 (twelve) hours as needed for nausea.  12 suppository  3  . Sulfamethoxazole-Trimethoprim (BACTRIM PO) Take by mouth 2 (two) times daily.      . valACYclovir (VALTREX) 500 MG tablet Take 1 tablet (500 mg total) by mouth 2 (two) times daily.  60 tablet  6  . venlafaxine XR (EFFEXOR-XR) 75 MG 24 hr capsule Take 1 capsule (75 mg total) by mouth daily.  30 capsule  6   No current facility-administered medications for this visit.    SURGICAL HISTORY:  Past Surgical History  Procedure Laterality Date  . Esophagogastroduodenoscopy  2007    sprue  . Colonoscopy  2006  . Cryoblation of cervix    . Breast lumpectomy    . Total mastectomy Bilateral 09/11/2012    Procedure: bilateral MASTECTOMY;  Surgeon: Adolph Pollack, MD;  Location: [facility] OR;  Service: General;  Laterality: Bilateral;  . Axillary sentinel node biopsy Right 09/11/2012    Procedure: AXILLARY SENTINEL lymph NODE  BIOPSY;  Surgeon: Adolph Pollack, MD;  Location: [facility] OR;  Service: General;  Laterality: Right;  right nuclear medicine injection 12:30   . Breast reconstruction with placement of tissue expander and flex hd (acellular hydrated dermis) Bilateral 09/11/2012    Procedure: BREAST RECONSTRUCTION WITH PLACEMENT OF TISSUE EXPANDER AND FLEX HD (ACELLULAR  HYDRATED DERMIS) ADM;  Surgeon: Wayland Denis, DO;  Location: [facility] OR;  Service: Plastics;  Laterality: Bilateral;  . Incision and drainage of wound Left 09/16/2012    Procedure: Left Breast Evacuation of Hematoma;  Surgeon: Wayland Denis, DO;  Location: [facility] OR;  Service: Plastics;  Laterality: Left;  . Portacath placement Right 10/09/2012    Procedure: US GUIDED INSERTION PORT-A-CATH;  Surgeon: Adolph Pollack, MD;  Location: [facility];  Service: General;  Laterality: Right;  Right Subclavian Vein    REVIEW OF SYSTEMS:   General: fatigue (+), night sweats (-), fever (-), pain (-) Lymph: palpable nodes (-) HEENT: vision changes (-), mucositis (-), gum bleeding (-), epistaxis (-) Cardiovascular: chest pain (-), palpitations (-) Pulmonary: shortness of  breath (-), dyspnea on exertion (-), cough (-), hemoptysis (-) GI:  Early satiety (-), melena (-), dysphagia (-), nausea/vomiting (-), diarrhea (-) GU: dysuria (-), hematuria (-), incontinence (-) Musculoskeletal: joint swelling (-), joint pain (-), back pain (-) Neuro: weakness (-), numbness (-), headache (-), confusion (-) Skin: Rash (-), lesions (-), dryness (-) Psych: depression (-), suicidal/homicidal ideation (-), feeling of hopelessness (-)   PHYSICAL EXAMINATION: Blood pressure 112/77, pulse 130, temperature 98.8 F (37.1 C), temperature source Oral, resp. rate 20, height 5\' 5"  (1.651 m), weight 138 lb 6.4 oz (62.778 kg), last menstrual period 09/11/2012. Body mass index is 23.03 kg/(m^2). General: Patient is a well appearing female in no acute distress HEENT: PERRLA, sclerae anicteric no conjunctival pallor, MMM Neck: supple, no palpable adenopathy Lungs: clear to auscultation bilaterally, no wheezes, rhonchi, or rales Cardiovascular: regular rate rhythm, S1, S2, no murmurs, rubs or gallops, HR 96 recheck Abdomen: Soft, non-tender, non-distended, normoactive bowel sounds, no HSM Extremities: warm and well perfused, no  clubbing, cyanosis, or edema Skin: No rashes or lesions, scalp erythema appears to be a birthmark Neuro: Non-focal Breasts: bilateral mastectomy sites are well healed, no nodularity, masses, or skin changes, expanders in place. ECOG PERFORMANCE STATUS: 1 - Symptomatic but completely ambulatory  LABORATORY DATA: Lab Results  Component Value Date   WBC 0.4* 11/19/2012   HGB 10.2* 11/19/2012   HCT 31.5* 11/19/2012   MCV 79.5 11/19/2012   PLT 145 11/19/2012      Chemistry      Component Value Date/Time   NA 135* 11/19/2012 1243   NA 138 09/15/2012 2035   K 4.1 11/19/2012 1243   K 3.6 09/15/2012 2035   CL 103 11/19/2012 1243   CL 103 09/15/2012 2035   CO2 23 11/19/2012 1243   CO2 26 09/15/2012 2035   BUN 16.6 11/19/2012 1243   BUN 4* 09/15/2012 2035   CREATININE 0.6 11/19/2012 1243   CREATININE 0.54 09/15/2012 2035      Component Value Date/Time   CALCIUM 9.0 11/19/2012 1243   CALCIUM 9.0 09/15/2012 2035   ALKPHOS 224* 11/19/2012 1243   ALKPHOS 142* 09/15/2012 2035   AST 86* 11/19/2012 1243   AST 32 09/15/2012 2035   ALT 180* 11/19/2012 1243   ALT 24 09/15/2012 2035   BILITOT 0.42 11/19/2012 1243   BILITOT 0.8 09/15/2012 2035     ADDITIONAL INFORMATION: 3. CHROMOGENIC IN-SITU HYBRIDIZATION Interpretation HER-2/NEU BY CISH - NO AMPLIFICATION OF HER-2 DETECTED. THE RATIO OF HER-2: CEP 17 SIGNALS WAS 1.52. Reference range: Ratio: HER2:CEP17 < 1.8 - gene amplification not observed Ratio: HER2:CEP 17 1.8-2.2 - equivocal result Ratio: HER2:CEP17 > 2.2 - gene amplification observed Pecola Leisure MD Pathologist, Electronic Signature ( Signed 09/22/2012) 3. PROGNOSTIC INDICATORS - ACIS Results IMMUNOHISTOCHEMICAL AND MORPHOMETRIC ANALYSIS BY THE AUTOMATED CELLULAR IMAGING SYSTEM (ACIS) Estrogen Receptor (Negative, <1%): 2%, POSITIVE, WEAK STAINING INTENSITY Progesterone Receptor (Negative, <1%): 0%, NEGATIVE COMMENT: The negative hormone receptor study in this case has an internal positive control. All controls stained  appropriately Pecola Leisure MD Pathologist, Electronic Signature ( Signed 09/22/2012) 1 of 4 FINAL for Mohrmann, Melitza A [phone]) FINAL DIAGNOSIS Diagnosis 1. Lymph node, sentinel, biopsy, Right axilla - ONE LYMPH NODE, NEGATIVE FOR TUMOR (0/1). 2. Breast, simple mastectomy, Left - BENIGN BREAST TISSUE WITH INTRADUCTAL PAPILLOMA, SEE COMMENT. - NEGATIVE FOR ATYPIA OR MALIGNANCY. - SURGICAL MARGINS, NEGATIVE FOR ATYPIA OR MALIGNANCY. - MICROCALCIFICATIONS IDENTIFIED. 3. Breast, simple mastectomy, Right - INVASIVE DUCTAL CARCINOMA, GRADE III, WITH SPINDLE CELL DIFFERENTIATION (  METAPLASTIC CARCINOMA) (2.3CM), SEE COMMENT. - NO LYMPHOVASCULAR INVASION IDENTIFIED. - INVASIVE TUMOR IS 1.5 CM FROM NEAREST MARGIN (DEEP). - DUCTAL CARCINOMA IN SITU, GRADE III, WITH COMEDONECROSIS. - SEE TUMOR SYNOPTIC TEMPLATE BELOW. Microscopic Comment 2. Although there was no mass grossly identified, there are definitive morphologic features of intraductal papilloma with associated fibrocystic change present. In addition, there are fibrocystic changes with usual ductal hyperplasia in representative sections not involved by papilloma. Finally, foci of sclerosing adenosis and microcalcifications in benign ducts and lobules are present. The surgical resection margin(s) of the specimen were inked and microscopically evaluated. 3. BREAST, INVASIVE TUMOR, WITH LYMPH NODE SAMPLING Specimen, including laterality: Right breast. Procedure: Simple mastectomy. Grade: III of III. Tubule formation: 3. Nuclear pleomorphism: 3. Mitotic: 2. Tumor size (gross measurement): 2.3 cm. Margins: Invasive, distance to closest margin: 1.5 cm. In-situ, distance to closest margin: 1.5 cm (deep). If margin positive, focally or broadly: N/A. Lymphovascular invasion: Absent. Ductal carcinoma in situ: Present. Grade: III of III. Extensive intraductal component: Absent. Lobular neoplasia: Absent. Tumor focality:  Unifocal. Treatment effect: None. If present, treatment effect in breast tissue, lymph nodes or both: N/A. Extent of tumor: Skin and Nipple: Grossly negative. Skeletal muscle: N/A. Lymph nodes: # examined: 1. Lymph nodes with metastasis: 0. Breast prognostic profile: Estrogen receptor: Repeated; previous study demonstrates 0% positivity ([id number]). Progesterone receptor: Repeated; previous study demonstrates 0% positivity ([id number]). HER-2/neu: Repeated; previous study demonstrated no amplification (1.55) ([id number]). 2 of 4 FINAL for Nancy, Beard A [phone]) Microscopic Comment(continued) Ki-67: Not repeated; previous study demonstrates 34% proliferation rate [phone]). Non-neoplastic breast: Intraductal papilloma with fibrocystic change and usual ductal hyperplasia, fibrocystic change with usual ductal hyperplasia, microcalcifications, and previous biopsy site. TNM: pT2, pN0, pMX. Comments: Although there are definitive features of high grade invasive ductal carcinoma identified, there are multiple foci where the carcinoma assumes a spindle cell morphology / spindle cell differentiation. 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 There are no heterologous elements identified. (CRR:eps 09/15/12)  RADIOGRAPHIC STUDIES:  Chest 2 View  09/08/2012  *RADIOLOGY REPORT*  Clinical Data: Right-sided breast  CHEST - 2 VIEW  Comparison: Chest x-ray 12/06/2009.  Findings: Lung volumes are normal.  No consolidative airspace disease.  No pleural effusions.  No pneumothorax.  No pulmonary nodule or mass noted.  Pulmonary vasculature and the cardiomediastinal silhouette are within normal limits.  Mild bilateral apical pleuroparenchymal thickening most compatible with chronic scarring, unchanged compared to the prior examination.  IMPRESSION: 1. No radiographic evidence of acute cardiopulmonary disease.    Original Report Authenticated By: Trudie Reed, M.D.    Nm Sentinel Node Inj-no Rpt (breast)  09/11/2012  CLINICAL DATA: Invasive right breast cancer   Sulfur colloid was injected intradermally by the nuclear medicine  technologist for breast cancer sentinel node localization.      ASSESSMENT: 57 year old female with  #1 invasive ductal carcinoma of the right breast status post bilateral mastectomies for a 2.3 cm node negative essentially triple negative disease on the right. Without any evidence of malignancy on the left. Patient's a candidate for adjuvant chemotherapy. We discussed Adriamycin Cytoxan dose dense followed by weekly Taxol Carbo. Since her tumor was 2% estrogen receptor positive we will utilize an aromatase inhibitor for 5 years. She understands the rationale for this.  #2 patient is also having considerable anxiety and will take Effexor daily.    #4 she also began Prilosec 20 mg daily for reflux.  PLAN:   #  1 Doing well. Ms. Cumbo is neutropenic.  I reviewed Neutropenic precautions in detail with her, she will continue on Levaquin until her counts recover.      #2 She will return in one week's time for labs and an appointment prior to her final cycle of Adriamycin/Cytoxan.    All questions were answered. The patient knows to call the clinic with any problems, questions or concerns. We can certainly see the patient much sooner if necessary.  I spent 25 minutes counseling the patient face to face. The total time spent in the appointment was 30 minutes.  Cherie Ouch Lyn Hollingshead, NP Medical Oncology [facility] Phone: [phone] 11/19/2012, 3:53 PM

## 2012-11-24 ENCOUNTER — Other Ambulatory Visit: Payer: Self-pay | Admitting: *Deleted

## 2012-11-24 DIAGNOSIS — F4323 Adjustment disorder with mixed anxiety and depressed mood: Secondary | ICD-10-CM

## 2012-11-24 MED ORDER — VENLAFAXINE HCL ER 75 MG PO CP24: 75.0000 mg | ORAL_CAPSULE | Freq: Every day | ORAL | Status: DC

## 2012-11-26 ENCOUNTER — Ambulatory Visit (HOSPITAL_BASED_OUTPATIENT_CLINIC_OR_DEPARTMENT_OTHER): Payer: Private Health Insurance - Indemnity

## 2012-11-26 ENCOUNTER — Ambulatory Visit (HOSPITAL_BASED_OUTPATIENT_CLINIC_OR_DEPARTMENT_OTHER): Payer: Private Health Insurance - Indemnity | Admitting: Adult Health

## 2012-11-26 ENCOUNTER — Other Ambulatory Visit: Payer: Self-pay | Admitting: Emergency Medicine

## 2012-11-26 ENCOUNTER — Other Ambulatory Visit (HOSPITAL_BASED_OUTPATIENT_CLINIC_OR_DEPARTMENT_OTHER): Payer: Private Health Insurance - Indemnity | Admitting: Lab

## 2012-11-26 ENCOUNTER — Encounter: Payer: Self-pay | Admitting: Adult Health

## 2012-11-26 VITALS — BP 116/74 | HR 99 | Temp 98.0°F | Resp 20 | Ht 65.0 in | Wt 136.3 lb

## 2012-11-26 DIAGNOSIS — C50311 Malignant neoplasm of lower-inner quadrant of right female breast: Secondary | ICD-10-CM

## 2012-11-26 DIAGNOSIS — C50319 Malignant neoplasm of lower-inner quadrant of unspecified female breast: Secondary | ICD-10-CM

## 2012-11-26 DIAGNOSIS — Z171 Estrogen receptor negative status [ER-]: Secondary | ICD-10-CM

## 2012-11-26 DIAGNOSIS — Z452 Encounter for adjustment and management of vascular access device: Secondary | ICD-10-CM

## 2012-11-26 DIAGNOSIS — L539 Erythematous condition, unspecified: Secondary | ICD-10-CM

## 2012-11-26 DIAGNOSIS — Z5111 Encounter for antineoplastic chemotherapy: Secondary | ICD-10-CM

## 2012-11-26 DIAGNOSIS — C50312 Malignant neoplasm of lower-inner quadrant of left female breast: Secondary | ICD-10-CM

## 2012-11-26 DIAGNOSIS — F411 Generalized anxiety disorder: Secondary | ICD-10-CM

## 2012-11-26 LAB — CBC WITH DIFFERENTIAL/PLATELET
BASO%: 1 % (ref 0.0–2.0)
Basophils Absolute: 0.1 10*3/uL (ref 0.0–0.1)
EOS%: 0 % (ref 0.0–7.0)
Eosinophils Absolute: 0 10*3/uL (ref 0.0–0.5)
HCT: 30.1 % — ABNORMAL LOW (ref 34.8–46.6)
HGB: 9.9 g/dL — ABNORMAL LOW (ref 11.6–15.9)
LYMPH%: 14.8 % (ref 14.0–49.7)
MCH: 25.6 pg (ref 25.1–34.0)
MCHC: 32.9 g/dL (ref 31.5–36.0)
MCV: 77.8 fL — ABNORMAL LOW (ref 79.5–101.0)
MONO#: 0.9 10*3/uL (ref 0.1–0.9)
MONO%: 10.9 % (ref 0.0–14.0)
NEUT#: 6.1 10*3/uL (ref 1.5–6.5)
NEUT%: 73.3 % (ref 38.4–76.8)
Platelets: 314 10*3/uL (ref 145–400)
RBC: 3.87 10*6/uL (ref 3.70–5.45)
RDW: 18 % — ABNORMAL HIGH (ref 11.2–14.5)
WBC: 8.3 10*3/uL (ref 3.9–10.3)
lymph#: 1.2 10*3/uL (ref 0.9–3.3)
nRBC: 2 % — ABNORMAL HIGH (ref 0–0)

## 2012-11-26 LAB — COMPREHENSIVE METABOLIC PANEL (CC13)
ALT: 78 U/L — ABNORMAL HIGH (ref 0–55)
AST: 30 U/L (ref 5–34)
Albumin: 3.3 g/dL — ABNORMAL LOW (ref 3.5–5.0)
Alkaline Phosphatase: 257 U/L — ABNORMAL HIGH (ref 40–150)
BUN: 10 mg/dL (ref 7.0–26.0)
CO2: 26 mEq/L (ref 22–29)
Calcium: 9.2 mg/dL (ref 8.4–10.4)
Chloride: 106 mEq/L (ref 98–107)
Creatinine: 0.6 mg/dL (ref 0.6–1.1)
Glucose: 99 mg/dl (ref 70–99)
Potassium: 3.7 mEq/L (ref 3.5–5.1)
Sodium: 140 mEq/L (ref 136–145)
Total Bilirubin: <0.2 mg/dL (ref 0.20–1.20)
Total Protein: 6.5 g/dL (ref 6.4–8.3)

## 2012-11-26 MED ORDER — ALTEPLASE 2 MG IJ SOLR
2.0000 mg | Freq: Once | INTRAMUSCULAR | Status: AC | PRN
  Administered 2012-11-26: 2 mg
  Filled 2012-11-26: qty 2

## 2012-11-26 MED ORDER — SODIUM CHLORIDE 0.9 % IJ SOLN
10.0000 mL | INTRAMUSCULAR | Status: DC | PRN
  Administered 2012-11-26: 10 mL
  Filled 2012-11-26: qty 10

## 2012-11-26 MED ORDER — DOXORUBICIN HCL CHEMO IV INJECTION 2 MG/ML
60.0000 mg/m2 | Freq: Once | INTRAVENOUS | Status: AC
  Administered 2012-11-26: 108 mg via INTRAVENOUS
  Filled 2012-11-26: qty 54

## 2012-11-26 MED ORDER — DEXAMETHASONE SODIUM PHOSPHATE 20 MG/5ML IJ SOLN
12.0000 mg | Freq: Once | INTRAMUSCULAR | Status: AC
  Administered 2012-11-26: 12 mg via INTRAVENOUS

## 2012-11-26 MED ORDER — LORAZEPAM 2 MG/ML IJ SOLN
0.5000 mg | Freq: Once | INTRAMUSCULAR | Status: AC
  Administered 2012-11-26: 0.5 mg via INTRAVENOUS

## 2012-11-26 MED ORDER — PALONOSETRON HCL INJECTION 0.25 MG/5ML
0.2500 mg | Freq: Once | INTRAVENOUS | Status: AC
  Administered 2012-11-26: 0.25 mg via INTRAVENOUS

## 2012-11-26 MED ORDER — SODIUM CHLORIDE 0.9 % IV SOLN
150.0000 mg | Freq: Once | INTRAVENOUS | Status: AC
  Administered 2012-11-26: 150 mg via INTRAVENOUS
  Filled 2012-11-26: qty 5

## 2012-11-26 MED ORDER — SODIUM CHLORIDE 0.9 % IV SOLN
600.0000 mg/m2 | Freq: Once | INTRAVENOUS | Status: AC
  Administered 2012-11-26: 1080 mg via INTRAVENOUS
  Filled 2012-11-26: qty 54

## 2012-11-26 MED ORDER — SODIUM CHLORIDE 0.9 % IV SOLN
Freq: Once | INTRAVENOUS | Status: AC
  Administered 2012-11-26: 15:00:00 via INTRAVENOUS

## 2012-11-26 MED ORDER — ONDANSETRON HCL 8 MG PO TABS: 8.0000 mg | ORAL_TABLET | Freq: Two times a day (BID) | ORAL | Status: DC | PRN

## 2012-11-26 MED ORDER — HEPARIN SOD (PORK) LOCK FLUSH 100 UNIT/ML IV SOLN
500.0000 [IU] | Freq: Once | INTRAVENOUS | Status: AC | PRN
  Administered 2012-11-26: 500 [IU]
  Filled 2012-11-26: qty 5

## 2012-11-26 NOTE — Patient Instructions (Addendum)
[facility] Discharge Instructions for Patients Receiving Chemotherapy  Today you received the following chemotherapy agents Adriamycin/Cytoxan  To help prevent nausea and vomiting after your treatment, we encourage you to take your nausea medication as prescribed.   If you develop nausea and vomiting that is not controlled by your nausea medication, call the clinic. If it is after clinic hours your family physician or the after hours number for the clinic or go to the Emergency Department.   BELOW ARE SYMPTOMS THAT SHOULD BE REPORTED IMMEDIATELY:  *FEVER GREATER THAN 100.5 F  *CHILLS WITH OR WITHOUT FEVER  NAUSEA AND VOMITING THAT IS NOT CONTROLLED WITH YOUR NAUSEA MEDICATION  *UNUSUAL SHORTNESS OF BREATH  *UNUSUAL BRUISING OR BLEEDING  TENDERNESS IN MOUTH AND THROAT WITH OR WITHOUT PRESENCE OF ULCERS  *URINARY PROBLEMS  *BOWEL PROBLEMS  UNUSUAL RASH Items with * indicate a potential emergency and should be followed up as soon as possible.  Feel free to call the clinic you have any questions or concerns. The clinic phone number is [phone].   I have been informed and understand all the instructions given to me. I know to contact the clinic, my physician, or go to the Emergency Department if any problems should occur. I do not have any questions at this time, but understand that I may call the clinic during office hours   should I have any questions or need assistance in obtaining follow up care.    __________________________________________  _____________  __________ Signature of Patient or Authorized Representative            Date                   Time    __________________________________________ Nurse's Signature    

## 2012-11-26 NOTE — Patient Instructions (Addendum)
Doing well.  Proceed with chemotherapy.  Please call us if you have any questions or concerns.    

## 2012-11-26 NOTE — Progress Notes (Signed)
OFFICE PROGRESS NOTE  CC  Nancy Mew, MD [address] [place] [address] Dr. Lurline Hare  Dr. Avel Peace  DIAGNOSIS: 57 year old female with new diagnosis of stage II Beard breast cancer that is triple negative    STAGE:  Cancer of lower-inner quadrant of female breast  Primary site: Breast (Right)  Staging method: AJCC 7th Edition  Clinical: Stage IIA (T2, N0, cM0)  Summary: Stage IA (T2, N0, cM0)  PRIOR THERAPY: #1Patient's last mammogram was about 5 years ago. Recently she underwent Beard screening mammogram that showed Beard spiculated mass in the lower inner quadrant of the right breast. She had ultrasound performed that showed irregular mass at the 5:00 position 3 cm from the nipple measuring 1.1 cm. She had Beard needle core biopsy performed on 06/19/2012 the biopsy showed invasive ductal carcinoma with ductal carcinoma in situ grade 3 tumor was ER negative PR negative HER-2/neu negative. Ki-67 was elevated at 34%. She went on to have MRI of the breasts performed on 06/24/2012 the MRI showed in the middle third of the lower inner quadrant of the right breast Beard 1.5 x 1.3 x 1.9 cm irregular enhancing mass. No abnormal enhancement was seen in the left breast no enlarged axillary or internal mammary adenopathy was detected.   #2 patient is now status post bilateral mastectomies. On her right mastectomy she was found to have Beard 2.3 cm invasive ductal carcinoma 01 lymph nodes were positive for metastatic disease and this is T2 N0) stage II. Tumor was grade 3 ER 2% PR negative HER-2/neu negative with Beard Ki-67 at 34%. Left mastectomy only showed intraductal papilloma node atypia or malignancy was found.  #3 patient is now being considered for adjuvant chemotherapy since her tumor is essentially triple negative and is Beard stage II. We discussed the rationale for adjuvant chemotherapy. She will receive Adriamycin Cytoxan every 2 weeks for Beard total of 4 cycles with G-CSF support. Once  she completes this then she will receive Taxol and carboplatinum every week for Beard total of 12 weeks. I did discuss with her use of antiestrogen therapy such as an aromatase inhibitor since her tumor is 2% ER positive. I discussed the rationale for this.  CURRENT THERAPY:Cycle 4 day 1 Adriamycin Cytoxan with day 2 Neulasta.  INTERVAL HISTORY: Nancy Beard 57 y.o. female returns for followup of her right breast cancer.  She is doing well today.  She is here prior to cycle 4 of chemotherapy.  She endorses taste changes and Beard sore tongue that she is using salt water and an antimicrobial rinse for her mouth.  She is otherwise feeling well and denies fevers, chills, nausea, vomiting, constipation, diarrhea, numbness or any other concerns.   MEDICAL HISTORY: Past Medical History  Diagnosis Date  . Thyroid disease   . Depression   . Sprue   . Diverticulosis   . Hyperlipidemia   . Breast cancer     right  . Hypothyroidism   . Pneumonia     ALLERGIES:  is allergic to ciprofloxacin and gluten meal.  MEDICATIONS:  Current Outpatient Prescriptions  Medication Sig Dispense Refill  . acetaminophen (TYLENOL) 325 MG tablet Take 650 mg by mouth as needed.      [name] dexamethasone (DECADRON) 4 MG tablet Take 2 tablets by mouth once Beard day on the day after chemotherapy and then take 2 tablets two times Beard day for 2 days. Take with food.  30 tablet  1  . HYDROcodone-acetaminophen (NORCO/VICODIN) 5-325 MG  per tablet Take 1 tablet by mouth every 6 (six) hours as needed.  30 tablet  0  . levofloxacin (LEVAQUIN) 500 MG tablet Take 1 tablet (500 mg total) by mouth daily.  7 tablet  2  . levothyroxine (SYNTHROID, LEVOTHROID) 25 MCG tablet Take 25 mcg by mouth daily.      [name] lidocaine-prilocaine (EMLA) cream Apply topically as needed.  30 g  8  . liothyronine (CYTOMEL) 25 MCG tablet Take 12.5 mcg by mouth daily.      [name] LORazepam (ATIVAN) 0.5 MG tablet Take 1 tablet (0.5 mg total) by mouth every 6 (six) hours as  needed (Nausea or vomiting).  30 tablet  0  . Multiple Vitamin (MULTIVITAMIN) tablet Take 1 tablet by mouth daily.      [name] omeprazole (PRILOSEC) 20 MG capsule Take 1 capsule (20 mg total) by mouth daily.  30 capsule  6  . ondansetron (ZOFRAN) 8 MG tablet Take 1 tablet (8 mg total) by mouth 2 (two) times daily as needed. Starting the third day after chemo  30 tablet  6  . prochlorperazine (COMPAZINE) 10 MG tablet Take 1 tablet (10 mg total) by mouth every 6 (six) hours as needed.  30 tablet  0  . prochlorperazine (COMPAZINE) 25 MG suppository Place 1 suppository (25 mg total) rectally every 12 (twelve) hours as needed for nausea.  12 suppository  3  . Sulfamethoxazole-Trimethoprim (BACTRIM PO) Take by mouth 2 (two) times daily.      . valACYclovir (VALTREX) 500 MG tablet Take 1 tablet (500 mg total) by mouth 2 (two) times daily.  60 tablet  6  . venlafaxine XR (EFFEXOR-XR) 75 MG 24 hr capsule Take 1 capsule (75 mg total) by mouth daily.  90 capsule  3   No current facility-administered medications for this visit.    SURGICAL HISTORY:  Past Surgical History  Procedure Laterality Date  . Esophagogastroduodenoscopy  2007    sprue  . Colonoscopy  2006  . Cryoblation of cervix    . Breast lumpectomy    . Total mastectomy Bilateral 09/11/2012    Procedure: bilateral MASTECTOMY;  Surgeon: Adolph Pollack, MD;  Location: [facility] OR;  Service: General;  Laterality: Bilateral;  . Axillary sentinel node biopsy Right 09/11/2012    Procedure: AXILLARY SENTINEL lymph NODE  BIOPSY;  Surgeon: Adolph Pollack, MD;  Location: [facility] OR;  Service: General;  Laterality: Right;  right nuclear medicine injection 12:30   . Breast reconstruction with placement of tissue expander and flex hd (acellular hydrated dermis) Bilateral 09/11/2012    Procedure: BREAST RECONSTRUCTION WITH PLACEMENT OF TISSUE EXPANDER AND FLEX HD (ACELLULAR HYDRATED DERMIS) ADM;  Surgeon: Wayland Denis, DO;  Location: [facility] OR;  Service: Plastics;   Laterality: Bilateral;  . Incision and drainage of wound Left 09/16/2012    Procedure: Left Breast Evacuation of Hematoma;  Surgeon: Wayland Denis, DO;  Location: [facility] OR;  Service: Plastics;  Laterality: Left;  . Portacath placement Right 10/09/2012    Procedure: US GUIDED INSERTION PORT-Beard-CATH;  Surgeon: Adolph Pollack, MD;  Location: [facility];  Service: General;  Laterality: Right;  Right Subclavian Vein    REVIEW OF SYSTEMS:   General: fatigue (+), night sweats (-), fever (-), pain (-) Lymph: palpable nodes (-) HEENT: vision changes (-), mucositis (-), gum bleeding (-), epistaxis (-) Cardiovascular: chest pain (-), palpitations (-) Pulmonary: shortness of breath (-), dyspnea on exertion (-), cough (-), hemoptysis (-) GI:  Early satiety (-), melena (-),  dysphagia (-), nausea/vomiting (-), diarrhea (-) GU: dysuria (-), hematuria (-), incontinence (-) Musculoskeletal: joint swelling (-), joint pain (-), back pain (-) Neuro: weakness (-), numbness (-), headache (-), confusion (-) Skin: Rash (-), lesions (-), dryness (-) Psych: depression (-), suicidal/homicidal ideation (-), feeling of hopelessness (-)   PHYSICAL EXAMINATION: Blood pressure 116/74, pulse 99, temperature 98 F (36.7 C), temperature source Oral, resp. rate 20, height 5\' 5"  (1.651 m), weight 136 lb 4.8 oz (61.825 kg), last menstrual period 09/11/2012. Body mass index is 22.68 kg/(m^2). General: Patient is Beard well appearing female in no acute distress HEENT: PERRLA, sclerae anicteric no conjunctival pallor, MMM Neck: supple, no palpable adenopathy Lungs: clear to auscultation bilaterally, no wheezes, rhonchi, or rales Cardiovascular: regular rate rhythm, S1, S2, no murmurs, rubs or gallops, HR 96 recheck Abdomen: Soft, non-tender, non-distended, normoactive bowel sounds, no HSM Extremities: warm and well perfused, no clubbing, cyanosis, or edema Skin: No rashes or lesions, scalp erythema appears to be Beard  birthmark Neuro: Non-focal Breasts: bilateral mastectomy sites are well healed, no nodularity, masses, or skin changes, expanders in place. ECOG PERFORMANCE STATUS: 1 - Symptomatic but completely ambulatory  LABORATORY DATA: Lab Results  Component Value Date   WBC 8.3 11/26/2012   HGB 9.9* 11/26/2012   HCT 30.1* 11/26/2012   MCV 77.8* 11/26/2012   PLT 314 11/26/2012      Chemistry      Component Value Date/Time   NA 140 11/26/2012 1251   NA 138 09/15/2012 2035   K 3.7 11/26/2012 1251   K 3.6 09/15/2012 2035   CL 106 11/26/2012 1251   CL 103 09/15/2012 2035   CO2 26 11/26/2012 1251   CO2 26 09/15/2012 2035   BUN 10.0 11/26/2012 1251   BUN 4* 09/15/2012 2035   CREATININE 0.6 11/26/2012 1251   CREATININE 0.54 09/15/2012 2035      Component Value Date/Time   CALCIUM 9.2 11/26/2012 1251   CALCIUM 9.0 09/15/2012 2035   ALKPHOS 257* 11/26/2012 1251   ALKPHOS 142* 09/15/2012 2035   AST 30 11/26/2012 1251   AST 32 09/15/2012 2035   ALT 78* 11/26/2012 1251   ALT 24 09/15/2012 2035   BILITOT <0.20 Repeated and Verified 11/26/2012 1251   BILITOT 0.8 09/15/2012 2035     ADDITIONAL INFORMATION: 3. CHROMOGENIC IN-SITU HYBRIDIZATION Interpretation HER-2/NEU BY CISH - NO AMPLIFICATION OF HER-2 DETECTED. THE RATIO OF HER-2: CEP 17 SIGNALS WAS 1.52. Reference range: Ratio: HER2:CEP17 < 1.8 - gene amplification not observed Ratio: HER2:CEP 17 1.8-2.2 - equivocal result Ratio: HER2:CEP17 > 2.2 - gene amplification observed Nancy Leisure MD Pathologist, Electronic Signature ( Signed 09/22/2012) 3. PROGNOSTIC INDICATORS - ACIS Results IMMUNOHISTOCHEMICAL AND MORPHOMETRIC ANALYSIS BY THE AUTOMATED CELLULAR IMAGING SYSTEM (ACIS) Estrogen Receptor (Negative, <1%): 2%, POSITIVE, WEAK STAINING INTENSITY Progesterone Receptor (Negative, <1%): 0%, NEGATIVE COMMENT: The negative hormone receptor study in this case has an internal positive control. All controls stained appropriately Nancy Leisure MD Pathologist, Electronic  Signature ( Signed 09/22/2012) 1 of 4 FINAL for Nancy Beard, Nancy Beard [phone]) FINAL DIAGNOSIS Diagnosis 1. Lymph node, sentinel, biopsy, Right axilla - ONE LYMPH NODE, NEGATIVE FOR TUMOR (0/1). 2. Breast, simple mastectomy, Left - BENIGN BREAST TISSUE WITH INTRADUCTAL PAPILLOMA, SEE COMMENT. - NEGATIVE FOR ATYPIA OR MALIGNANCY. - SURGICAL MARGINS, NEGATIVE FOR ATYPIA OR MALIGNANCY. - MICROCALCIFICATIONS IDENTIFIED. 3. Breast, simple mastectomy, Right - INVASIVE DUCTAL CARCINOMA, GRADE III, WITH SPINDLE CELL DIFFERENTIATION (METAPLASTIC CARCINOMA) (2.3CM), SEE COMMENT. - NO LYMPHOVASCULAR INVASION IDENTIFIED. - INVASIVE TUMOR IS  1.5 CM FROM NEAREST MARGIN (DEEP). - DUCTAL CARCINOMA IN SITU, GRADE III, WITH COMEDONECROSIS. - SEE TUMOR SYNOPTIC TEMPLATE BELOW. Microscopic Comment 2. Although there was no mass grossly identified, there are definitive morphologic features of intraductal papilloma with associated fibrocystic change present. In addition, there are fibrocystic changes with usual ductal hyperplasia in representative sections not involved by papilloma. Finally, foci of sclerosing adenosis and microcalcifications in benign ducts and lobules are present. The surgical resection margin(s) of the specimen were inked and microscopically evaluated. 3. BREAST, INVASIVE TUMOR, WITH LYMPH NODE SAMPLING Specimen, including laterality: Right breast. Procedure: Simple mastectomy. Grade: III of III. Tubule formation: 3. Nuclear pleomorphism: 3. Mitotic: 2. Tumor size (gross measurement): 2.3 cm. Margins: Invasive, distance to closest margin: 1.5 cm. In-situ, distance to closest margin: 1.5 cm (deep). If margin positive, focally or broadly: N/Beard. Lymphovascular invasion: Absent. Ductal carcinoma in situ: Present. Grade: III of III. Extensive intraductal component: Absent. Lobular neoplasia: Absent. Tumor focality: Unifocal. Treatment effect: None. If present, treatment effect in  breast tissue, lymph nodes or both: N/Beard. Extent of tumor: Skin and Nipple: Grossly negative. Skeletal muscle: N/Beard. Lymph nodes: # examined: 1. Lymph nodes with metastasis: 0. Breast prognostic profile: Estrogen receptor: Repeated; previous study demonstrates 0% positivity ([id number]). Progesterone receptor: Repeated; previous study demonstrates 0% positivity ([id number]). HER-2/neu: Repeated; previous study demonstrated no amplification (1.55) ([id number]). 2 of 4 FINAL for Nancy Beard, Nancy Beard [phone]) Microscopic Comment(continued) Ki-67: Not repeated; previous study demonstrates 34% proliferation rate [phone]). Non-neoplastic breast: Intraductal papilloma with fibrocystic change and usual ductal hyperplasia, fibrocystic change with usual ductal hyperplasia, microcalcifications, and previous biopsy site. TNM: pT2, pN0, pMX. Comments: Although there are definitive features of high grade invasive ductal carcinoma identified, there are multiple foci where the carcinoma assumes Beard spindle cell morphology / spindle cell differentiation. 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 There are no heterologous elements identified. (CRR:eps 09/15/12)  RADIOGRAPHIC STUDIES:  Chest 2 View  09/08/2012  *RADIOLOGY REPORT*  Clinical Data: Right-sided breast  CHEST - 2 VIEW  Comparison: Chest x-ray 12/06/2009.  Findings: Lung volumes are normal.  No consolidative airspace disease.  No pleural effusions.  No pneumothorax.  No pulmonary nodule or mass noted.  Pulmonary vasculature and the cardiomediastinal silhouette are within normal limits.  Mild bilateral apical pleuroparenchymal thickening most compatible with chronic scarring, unchanged compared to the prior examination.  IMPRESSION: 1. No radiographic evidence of acute cardiopulmonary disease.   Original Report Authenticated By: Trudie Reed, M.D.    Nm Sentinel  Node Inj-no Rpt (breast)  09/11/2012  CLINICAL DATA: Invasive right breast cancer   Sulfur colloid was injected intradermally by the nuclear medicine  technologist for breast cancer sentinel node localization.      ASSESSMENT: 57 year old female with  #1 invasive ductal carcinoma of the right breast status post bilateral mastectomies for Beard 2.3 cm node negative essentially triple negative disease on the right. Without any evidence of malignancy on the left. Patient's Beard candidate for adjuvant chemotherapy. We discussed Adriamycin Cytoxan dose dense followed by weekly Taxol Carbo. Since her tumor was 2% estrogen receptor positive we will utilize an aromatase inhibitor for 5 years. She understands the rationale for this.  #2 patient is also having considerable anxiety and will take Effexor daily.    #4 she also began Prilosec 20 mg daily for reflux.  PLAN:   #1 Doing well. Ms. Wentland labs have recovered.  She will proceed with chemotherapy.  She will stay on the Levaquin per Dr. Kelly Splinter for her left breast incisional erythema.  The breast is unchanged.    #2 She will return in one week's time for labs and an appointment for evaluation for chemotoxicities.    All questions were answered. The patient knows to call the clinic with any problems, questions or concerns. We can certainly see the patient much sooner if necessary.  I spent 25 minutes counseling the patient face to face. The total time spent in the appointment was 30 minutes.  Cherie Ouch Lyn Hollingshead, NP Medical Oncology [facility] Phone: [phone] 11/26/2012, 10:29 PM

## 2012-11-26 NOTE — Progress Notes (Signed)
Brisk blood return noted before, during and at end of Adriamycin administration. 

## 2012-11-27 ENCOUNTER — Ambulatory Visit (HOSPITAL_BASED_OUTPATIENT_CLINIC_OR_DEPARTMENT_OTHER): Payer: Private Health Insurance - Indemnity

## 2012-11-27 VITALS — BP 106/67 | HR 101 | Temp 98.4°F

## 2012-11-27 DIAGNOSIS — C50319 Malignant neoplasm of lower-inner quadrant of unspecified female breast: Secondary | ICD-10-CM

## 2012-11-27 DIAGNOSIS — Z5189 Encounter for other specified aftercare: Secondary | ICD-10-CM

## 2012-11-27 MED ORDER — PEGFILGRASTIM INJECTION 6 MG/0.6ML
6.0000 mg | Freq: Once | SUBCUTANEOUS | Status: AC
  Administered 2012-11-27: 6 mg via SUBCUTANEOUS
  Filled 2012-11-27: qty 0.6

## 2012-12-03 ENCOUNTER — Ambulatory Visit (HOSPITAL_BASED_OUTPATIENT_CLINIC_OR_DEPARTMENT_OTHER): Payer: Private Health Insurance - Indemnity | Admitting: Oncology

## 2012-12-03 ENCOUNTER — Other Ambulatory Visit (HOSPITAL_BASED_OUTPATIENT_CLINIC_OR_DEPARTMENT_OTHER): Payer: Private Health Insurance - Indemnity | Admitting: Lab

## 2012-12-03 ENCOUNTER — Telehealth: Payer: Self-pay | Admitting: Emergency Medicine

## 2012-12-03 VITALS — BP 92/70 | HR 126 | Temp 98.4°F | Resp 20 | Ht 65.0 in | Wt 135.6 lb

## 2012-12-03 DIAGNOSIS — Z171 Estrogen receptor negative status [ER-]: Secondary | ICD-10-CM

## 2012-12-03 DIAGNOSIS — K219 Gastro-esophageal reflux disease without esophagitis: Secondary | ICD-10-CM

## 2012-12-03 DIAGNOSIS — C50319 Malignant neoplasm of lower-inner quadrant of unspecified female breast: Secondary | ICD-10-CM

## 2012-12-03 DIAGNOSIS — F411 Generalized anxiety disorder: Secondary | ICD-10-CM

## 2012-12-03 DIAGNOSIS — C50311 Malignant neoplasm of lower-inner quadrant of right female breast: Secondary | ICD-10-CM

## 2012-12-03 LAB — COMPREHENSIVE METABOLIC PANEL (CC13)
ALT: 82 U/L — ABNORMAL HIGH (ref 0–55)
AST: 46 U/L — ABNORMAL HIGH (ref 5–34)
Albumin: 3.4 g/dL — ABNORMAL LOW (ref 3.5–5.0)
Alkaline Phosphatase: 207 U/L — ABNORMAL HIGH (ref 40–150)
BUN: 12.7 mg/dL (ref 7.0–26.0)
CO2: 25 mEq/L (ref 22–29)
Calcium: 9.4 mg/dL (ref 8.4–10.4)
Chloride: 103 mEq/L (ref 98–107)
Creatinine: 0.7 mg/dL (ref 0.6–1.1)
Glucose: 163 mg/dl — ABNORMAL HIGH (ref 70–99)
Potassium: 4.2 mEq/L (ref 3.5–5.1)
Sodium: 138 mEq/L (ref 136–145)
Total Bilirubin: 0.57 mg/dL (ref 0.20–1.20)
Total Protein: 6.8 g/dL (ref 6.4–8.3)

## 2012-12-03 LAB — CBC WITH DIFFERENTIAL/PLATELET
BASO%: 0 % (ref 0.0–2.0)
Basophils Absolute: 0 10*3/uL (ref 0.0–0.1)
EOS%: 0 % (ref 0.0–7.0)
Eosinophils Absolute: 0 10*3/uL (ref 0.0–0.5)
HCT: 30.9 % — ABNORMAL LOW (ref 34.8–46.6)
HGB: 10.2 g/dL — ABNORMAL LOW (ref 11.6–15.9)
LYMPH%: 96.4 % — ABNORMAL HIGH (ref 14.0–49.7)
MCH: 26.5 pg (ref 25.1–34.0)
MCHC: 33.1 g/dL (ref 31.5–36.0)
MCV: 80.1 fL (ref 79.5–101.0)
MONO#: 0 10*3/uL — ABNORMAL LOW (ref 0.1–0.9)
MONO%: 0.9 % (ref 0.0–14.0)
NEUT#: 0 10*3/uL — CL (ref 1.5–6.5)
NEUT%: 2.7 % — ABNORMAL LOW (ref 38.4–76.8)
Platelets: 153 10*3/uL (ref 145–400)
RBC: 3.86 10*6/uL (ref 3.70–5.45)
RDW: 18.3 % — ABNORMAL HIGH (ref 11.2–14.5)
WBC: 0.6 10*3/uL — CL (ref 3.9–10.3)
lymph#: 0.6 10*3/uL — ABNORMAL LOW (ref 0.9–3.3)

## 2012-12-03 MED ORDER — ONDANSETRON HCL 8 MG PO TABS: 8.0000 mg | ORAL_TABLET | Freq: Two times a day (BID) | ORAL | Status: DC

## 2012-12-03 MED ORDER — LORAZEPAM 0.5 MG PO TABS: 0.5000 mg | ORAL_TABLET | Freq: Four times a day (QID) | ORAL | Status: DC | PRN

## 2012-12-03 MED ORDER — PROCHLORPERAZINE MALEATE 10 MG PO TABS: 10.0000 mg | ORAL_TABLET | Freq: Four times a day (QID) | ORAL | Status: DC | PRN

## 2012-12-03 MED ORDER — PROCHLORPERAZINE 25 MG RE SUPP: 25.0000 mg | Freq: Two times a day (BID) | RECTAL | Status: DC | PRN

## 2012-12-03 MED ORDER — DEXAMETHASONE 4 MG PO TABS: 8.0000 mg | ORAL_TABLET | Freq: Two times a day (BID) | ORAL | Status: DC

## 2012-12-03 NOTE — Telephone Encounter (Signed)
Rx for Zofran and Ativan mailed to patient's home address.

## 2012-12-12 ENCOUNTER — Encounter (INDEPENDENT_AMBULATORY_CARE_PROVIDER_SITE_OTHER): Payer: Self-pay | Admitting: General Surgery

## 2012-12-16 ENCOUNTER — Ambulatory Visit (HOSPITAL_BASED_OUTPATIENT_CLINIC_OR_DEPARTMENT_OTHER): Payer: Private Health Insurance - Indemnity

## 2012-12-16 ENCOUNTER — Encounter: Payer: Self-pay | Admitting: Adult Health

## 2012-12-16 ENCOUNTER — Ambulatory Visit (HOSPITAL_BASED_OUTPATIENT_CLINIC_OR_DEPARTMENT_OTHER): Payer: Private Health Insurance - Indemnity | Admitting: Adult Health

## 2012-12-16 ENCOUNTER — Other Ambulatory Visit (HOSPITAL_BASED_OUTPATIENT_CLINIC_OR_DEPARTMENT_OTHER): Payer: Private Health Insurance - Indemnity | Admitting: Lab

## 2012-12-16 VITALS — BP 127/60 | HR 85 | Temp 98.1°F | Resp 20

## 2012-12-16 VITALS — BP 107/70 | HR 101 | Temp 98.6°F | Resp 20 | Ht 65.0 in | Wt 138.5 lb

## 2012-12-16 DIAGNOSIS — Z170 Estrogen receptor positive status [ER+]: Secondary | ICD-10-CM

## 2012-12-16 DIAGNOSIS — F411 Generalized anxiety disorder: Secondary | ICD-10-CM

## 2012-12-16 DIAGNOSIS — C50311 Malignant neoplasm of lower-inner quadrant of right female breast: Secondary | ICD-10-CM

## 2012-12-16 DIAGNOSIS — C50319 Malignant neoplasm of lower-inner quadrant of unspecified female breast: Secondary | ICD-10-CM

## 2012-12-16 DIAGNOSIS — Z5111 Encounter for antineoplastic chemotherapy: Secondary | ICD-10-CM

## 2012-12-16 DIAGNOSIS — K219 Gastro-esophageal reflux disease without esophagitis: Secondary | ICD-10-CM

## 2012-12-16 LAB — CBC WITH DIFFERENTIAL/PLATELET
BASO%: 0.6 % (ref 0.0–2.0)
Basophils Absolute: 0.1 10*3/uL (ref 0.0–0.1)
EOS%: 0 % (ref 0.0–7.0)
Eosinophils Absolute: 0 10*3/uL (ref 0.0–0.5)
HCT: 30 % — ABNORMAL LOW (ref 34.8–46.6)
HGB: 9.9 g/dL — ABNORMAL LOW (ref 11.6–15.9)
LYMPH%: 9.4 % — ABNORMAL LOW (ref 14.0–49.7)
MCH: 27 pg (ref 25.1–34.0)
MCHC: 33 g/dL (ref 31.5–36.0)
MCV: 81.7 fL (ref 79.5–101.0)
MONO#: 1.6 10*3/uL — ABNORMAL HIGH (ref 0.1–0.9)
MONO%: 19.2 % — ABNORMAL HIGH (ref 0.0–14.0)
NEUT#: 5.7 10*3/uL (ref 1.5–6.5)
NEUT%: 70.8 % (ref 38.4–76.8)
Platelets: 382 10*3/uL (ref 145–400)
RBC: 3.67 10*6/uL — ABNORMAL LOW (ref 3.70–5.45)
RDW: 24 % — ABNORMAL HIGH (ref 11.2–14.5)
WBC: 8.1 10*3/uL (ref 3.9–10.3)
lymph#: 0.8 10*3/uL — ABNORMAL LOW (ref 0.9–3.3)
nRBC: 0 % (ref 0–0)

## 2012-12-16 LAB — COMPREHENSIVE METABOLIC PANEL (CC13)
ALT: 45 U/L (ref 0–55)
AST: 43 U/L — ABNORMAL HIGH (ref 5–34)
Albumin: 3.5 g/dL (ref 3.5–5.0)
Alkaline Phosphatase: 185 U/L — ABNORMAL HIGH (ref 40–150)
BUN: 9.7 mg/dL (ref 7.0–26.0)
CO2: 26 mEq/L (ref 22–29)
Calcium: 9.1 mg/dL (ref 8.4–10.4)
Chloride: 104 mEq/L (ref 98–107)
Creatinine: 0.5 mg/dL — ABNORMAL LOW (ref 0.6–1.1)
Glucose: 88 mg/dl (ref 70–99)
Potassium: 4.1 mEq/L (ref 3.5–5.1)
Sodium: 139 mEq/L (ref 136–145)
Total Bilirubin: 0.25 mg/dL (ref 0.20–1.20)
Total Protein: 6.6 g/dL (ref 6.4–8.3)

## 2012-12-16 MED ORDER — SODIUM CHLORIDE 0.9 % IV SOLN
Freq: Once | INTRAVENOUS | Status: AC
  Administered 2012-12-16: 15:00:00 via INTRAVENOUS

## 2012-12-16 MED ORDER — PACLITAXEL CHEMO INJECTION 300 MG/50ML
80.0000 mg/m2 | Freq: Once | INTRAVENOUS | Status: AC
  Administered 2012-12-16: 132 mg via INTRAVENOUS
  Filled 2012-12-16: qty 22

## 2012-12-16 MED ORDER — FAMOTIDINE IN NACL 20-0.9 MG/50ML-% IV SOLN
20.0000 mg | Freq: Once | INTRAVENOUS | Status: AC
  Administered 2012-12-16: 20 mg via INTRAVENOUS

## 2012-12-16 MED ORDER — SODIUM CHLORIDE 0.9 % IJ SOLN
10.0000 mL | INTRAMUSCULAR | Status: DC | PRN
  Administered 2012-12-16: 10 mL
  Filled 2012-12-16: qty 10

## 2012-12-16 MED ORDER — ONDANSETRON 16 MG/50ML IVPB (CHCC)
16.0000 mg | Freq: Once | INTRAVENOUS | Status: AC
  Administered 2012-12-16: 16 mg via INTRAVENOUS

## 2012-12-16 MED ORDER — DIPHENHYDRAMINE HCL 50 MG/ML IJ SOLN
50.0000 mg | Freq: Once | INTRAMUSCULAR | Status: AC
  Administered 2012-12-16: 50 mg via INTRAVENOUS

## 2012-12-16 MED ORDER — SODIUM CHLORIDE 0.9 % IV SOLN
222.2000 mg | Freq: Once | INTRAVENOUS | Status: AC
  Administered 2012-12-16: 220 mg via INTRAVENOUS
  Filled 2012-12-16: qty 22

## 2012-12-16 MED ORDER — DEXAMETHASONE SODIUM PHOSPHATE 20 MG/5ML IJ SOLN
20.0000 mg | Freq: Once | INTRAMUSCULAR | Status: AC
  Administered 2012-12-16: 20 mg via INTRAVENOUS

## 2012-12-16 MED ORDER — HEPARIN SOD (PORK) LOCK FLUSH 100 UNIT/ML IV SOLN
500.0000 [IU] | Freq: Once | INTRAVENOUS | Status: AC | PRN
  Administered 2012-12-16: 500 [IU]
  Filled 2012-12-16: qty 5

## 2012-12-16 NOTE — Patient Instructions (Signed)
Take Super B complex one tablet daily.      Paclitaxel injection What is this medicine? PACLITAXEL (PAK li TAX el) is a chemotherapy drug. It targets fast dividing cells, like cancer cells, and causes these cells to die. This medicine is used to treat ovarian cancer, breast cancer, and other cancers. This medicine may be used for other purposes; ask your health care provider or pharmacist if you have questions. What should I tell my health care provider before I take this medicine? They need to know if you have any of these conditions: -blood disorders -irregular heartbeat -infection (especially a virus infection such as chickenpox, cold sores, or herpes) -liver disease -previous or ongoing radiation therapy -an unusual or allergic reaction to paclitaxel, alcohol, polyoxyethylated castor oil, other chemotherapy agents, other medicines, foods, dyes, or preservatives -pregnant or trying to get pregnant -breast-feeding How should I use this medicine? This drug is given as an infusion into a vein. It is administered in a hospital or clinic by a specially trained health care professional. Talk to your pediatrician regarding the use of this medicine in children. Special care may be needed. Overdosage: If you think you have taken too much of this medicine contact a poison control center or emergency room at once. NOTE: This medicine is only for you. Do not share this medicine with others. What if I miss a dose? It is important not to miss your dose. Call your doctor or health care professional if you are unable to keep an appointment. What may interact with this medicine? Do not take this medicine with any of the following medications: -disulfiram -metronidazole This medicine may also interact with the following medications: -cyclosporine -dexamethasone -diazepam -ketoconazole -medicines to increase blood counts like filgrastim, pegfilgrastim, sargramostim -other chemotherapy drugs like  cisplatin, doxorubicin, epirubicin, etoposide, teniposide, vincristine -quinidine -testosterone -vaccines -verapamil Talk to your doctor or health care professional before taking any of these medicines: -acetaminophen -aspirin -ibuprofen -ketoprofen -naproxen This list may not describe all possible interactions. Give your health care provider a list of all the medicines, herbs, non-prescription drugs, or dietary supplements you use. Also tell them if you smoke, drink alcohol, or use illegal drugs. Some items may interact with your medicine. What should I watch for while using this medicine? Your condition will be monitored carefully while you are receiving this medicine. You will need important blood work done while you are taking this medicine. This drug may make you feel generally unwell. This is not uncommon, as chemotherapy can affect healthy cells as well as cancer cells. Report any side effects. Continue your course of treatment even though you feel ill unless your doctor tells you to stop. In some cases, you may be given additional medicines to help with side effects. Follow all directions for their use. Call your doctor or health care professional for advice if you get a fever, chills or sore throat, or other symptoms of a cold or flu. Do not treat yourself. This drug decreases your body's ability to fight infections. Try to avoid being around people who are sick. This medicine may increase your risk to bruise or bleed. Call your doctor or health care professional if you notice any unusual bleeding. Be careful brushing and flossing your teeth or using a toothpick because you may get an infection or bleed more easily. If you have any dental work done, tell your dentist you are receiving this medicine. Avoid taking products that contain aspirin, acetaminophen, ibuprofen, naproxen, or ketoprofen unless instructed by  your doctor. These medicines may hide a fever. Do not become pregnant while  taking this medicine. Women should inform their doctor if they wish to become pregnant or think they might be pregnant. There is a potential for serious side effects to an unborn child. Talk to your health care professional or pharmacist for more information. Do not breast-feed an infant while taking this medicine. Men are advised not to father a child while receiving this medicine. What side effects may I notice from receiving this medicine? Side effects that you should report to your doctor or health care professional as soon as possible: -allergic reactions like skin rash, itching or hives, swelling of the face, lips, or tongue -low blood counts - This drug may decrease the number of white blood cells, red blood cells and platelets. You may be at increased risk for infections and bleeding. -signs of infection - fever or chills, cough, sore throat, pain or difficulty passing urine -signs of decreased platelets or bleeding - bruising, pinpoint red spots on the skin, black, tarry stools, nosebleeds -signs of decreased red blood cells - unusually weak or tired, fainting spells, lightheadedness -breathing problems -chest pain -high or low blood pressure -mouth sores -nausea and vomiting -pain, swelling, redness or irritation at the injection site -pain, tingling, numbness in the hands or feet -slow or irregular heartbeat -swelling of the ankle, feet, hands Side effects that usually do not require medical attention (report to your doctor or health care professional if they continue or are bothersome): -bone pain -complete hair loss including hair on your head, underarms, pubic hair, eyebrows, and eyelashes -changes in the color of fingernails -diarrhea -loosening of the fingernails -loss of appetite -muscle or joint pain -red flush to skin -sweating This list may not describe all possible side effects. Call your doctor for medical advice about side effects. You may report side effects to FDA  at 1-800-FDA-1088. Where should I keep my medicine? This drug is given in a hospital or clinic and will not be stored at home. NOTE: This sheet is a summary. It may not cover all possible information. If you have questions about this medicine, talk to your doctor, pharmacist, or health care provider.  2013, [employer]. (06/14/2008 11:54:26 AM)  Carboplatin injection What is this medicine? CARBOPLATIN (KAR boe pla tin) is a chemotherapy drug. It targets fast dividing cells, like cancer cells, and causes these cells to die. This medicine is used to treat ovarian cancer and many other cancers. This medicine may be used for other purposes; ask your health care provider or pharmacist if you have questions. What should I tell my health care provider before I take this medicine? They need to know if you have any of these conditions: -blood disorders -hearing problems -kidney disease -recent or ongoing radiation therapy -an unusual or allergic reaction to carboplatin, cisplatin, other chemotherapy, other medicines, foods, dyes, or preservatives -pregnant or trying to get pregnant -breast-feeding How should I use this medicine? This drug is usually given as an infusion into a vein. It is administered in a hospital or clinic by a specially trained health care professional. Talk to your pediatrician regarding the use of this medicine in children. Special care may be needed. Overdosage: If you think you have taken too much of this medicine contact a poison control center or emergency room at once. NOTE: This medicine is only for you. Do not share this medicine with others. What if I miss a dose? It is important not to  miss a dose. Call your doctor or health care professional if you are unable to keep an appointment. What may interact with this medicine? -medicines for seizures -medicines to increase blood counts like filgrastim, pegfilgrastim, sargramostim -some antibiotics like  amikacin, gentamicin, neomycin, streptomycin, tobramycin -vaccines Talk to your doctor or health care professional before taking any of these medicines: -acetaminophen -aspirin -ibuprofen -ketoprofen -naproxen This list may not describe all possible interactions. Give your health care provider a list of all the medicines, herbs, non-prescription drugs, or dietary supplements you use. Also tell them if you smoke, drink alcohol, or use illegal drugs. Some items may interact with your medicine. What should I watch for while using this medicine? Your condition will be monitored carefully while you are receiving this medicine. You will need important blood work done while you are taking this medicine. This drug may make you feel generally unwell. This is not uncommon, as chemotherapy can affect healthy cells as well as cancer cells. Report any side effects. Continue your course of treatment even though you feel ill unless your doctor tells you to stop. In some cases, you may be given additional medicines to help with side effects. Follow all directions for their use. Call your doctor or health care professional for advice if you get a fever, chills or sore throat, or other symptoms of a cold or flu. Do not treat yourself. This drug decreases your body's ability to fight infections. Try to avoid being around people who are sick. This medicine may increase your risk to bruise or bleed. Call your doctor or health care professional if you notice any unusual bleeding. Be careful brushing and flossing your teeth or using a toothpick because you may get an infection or bleed more easily. If you have any dental work done, tell your dentist you are receiving this medicine. Avoid taking products that contain aspirin, acetaminophen, ibuprofen, naproxen, or ketoprofen unless instructed by your doctor. These medicines may hide a fever. Do not become pregnant while taking this medicine. Women should inform their  doctor if they wish to become pregnant or think they might be pregnant. There is a potential for serious side effects to an unborn child. Talk to your health care professional or pharmacist for more information. Do not breast-feed an infant while taking this medicine. What side effects may I notice from receiving this medicine? Side effects that you should report to your doctor or health care professional as soon as possible: -allergic reactions like skin rash, itching or hives, swelling of the face, lips, or tongue -signs of infection - fever or chills, cough, sore throat, pain or difficulty passing urine -signs of decreased platelets or bleeding - bruising, pinpoint red spots on the skin, black, tarry stools, nosebleeds -signs of decreased red blood cells - unusually weak or tired, fainting spells, lightheadedness -breathing problems -changes in hearing -changes in vision -chest pain -high blood pressure -low blood counts - This drug may decrease the number of white blood cells, red blood cells and platelets. You may be at increased risk for infections and bleeding. -nausea and vomiting -pain, swelling, redness or irritation at the injection site -pain, tingling, numbness in the hands or feet -problems with balance, talking, walking -trouble passing urine or change in the amount of urine Side effects that usually do not require medical attention (report to your doctor or health care professional if they continue or are bothersome): -hair loss -loss of appetite -metallic taste in the mouth or changes in taste This  list may not describe all possible side effects. Call your doctor for medical advice about side effects. You may report side effects to FDA at 1-800-FDA-1088. Where should I keep my medicine? This drug is given in a hospital or clinic and will not be stored at home. NOTE: This sheet is a summary. It may not cover all possible information. If you have questions about this medicine,  talk to your doctor, pharmacist, or health care provider.  2012, [employer]. (10/07/2007 2:38:05 PM)

## 2012-12-16 NOTE — Patient Instructions (Addendum)
[facility] Discharge Instructions for Patients Receiving Chemotherapy  Today you received the following chemotherapy agents Taxol/Carboplatin  To help prevent nausea and vomiting after your treatment, we encourage you to take your nausea medication Zofran   If you develop nausea and vomiting that is not controlled by your nausea medication, call the clinic.   BELOW ARE SYMPTOMS THAT SHOULD BE REPORTED IMMEDIATELY:  *FEVER GREATER THAN 100.5 F  *CHILLS WITH OR WITHOUT FEVER  NAUSEA AND VOMITING THAT IS NOT CONTROLLED WITH YOUR NAUSEA MEDICATION  *UNUSUAL SHORTNESS OF BREATH  *UNUSUAL BRUISING OR BLEEDING  TENDERNESS IN MOUTH AND THROAT WITH OR WITHOUT PRESENCE OF ULCERS  *URINARY PROBLEMS  *BOWEL PROBLEMS  UNUSUAL RASH Items with * indicate a potential emergency and should be followed up as soon as possible.  Feel free to call the clinic you have any questions or concerns. The clinic phone number is [phone].  Paclitaxel injection (Taxol) What is this medicine? PACLITAXEL (PAK li TAX el) is a chemotherapy drug. It targets fast dividing cells, like cancer cells, and causes these cells to die. This medicine is used to treat ovarian cancer, breast cancer, and other cancers. This medicine may be used for other purposes; ask your health care provider or pharmacist if you have questions. What should I tell my health care provider before I take this medicine? They need to know if you have any of these conditions: -blood disorders -irregular heartbeat -infection (especially a virus infection such as chickenpox, cold sores, or herpes) -liver disease -previous or ongoing radiation therapy -an unusual or allergic reaction to paclitaxel, alcohol, polyoxyethylated castor oil, other chemotherapy agents, other medicines, foods, dyes, or preservatives -pregnant or trying to get pregnant -breast-feeding How should I use this medicine? This drug is given as an  infusion into a vein. It is administered in a hospital or clinic by a specially trained health care professional. Talk to your pediatrician regarding the use of this medicine in children. Special care may be needed. Overdosage: If you think you have taken too much of this medicine contact a poison control center or emergency room at once. NOTE: This medicine is only for you. Do not share this medicine with others. What if I miss a dose? It is important not to miss your dose. Call your doctor or health care professional if you are unable to keep an appointment. What may interact with this medicine? Do not take this medicine with any of the following medications: -disulfiram -metronidazole This medicine may also interact with the following medications: -cyclosporine -dexamethasone -diazepam -ketoconazole -medicines to increase blood counts like filgrastim, pegfilgrastim, sargramostim -other chemotherapy drugs like cisplatin, doxorubicin, epirubicin, etoposide, teniposide, vincristine -quinidine -testosterone -vaccines -verapamil Talk to your doctor or health care professional before taking any of these medicines: -acetaminophen -aspirin -ibuprofen -ketoprofen -naproxen This list may not describe all possible interactions. Give your health care provider a list of all the medicines, herbs, non-prescription drugs, or dietary supplements you use. Also tell them if you smoke, drink alcohol, or use illegal drugs. Some items may interact with your medicine. What should I watch for while using this medicine? Your condition will be monitored carefully while you are receiving this medicine. You will need important blood work done while you are taking this medicine. This drug may make you feel generally unwell. This is not uncommon, as chemotherapy can affect healthy cells as well as cancer cells. Report any side effects. Continue your course of treatment even though you feel ill  unless your doctor  tells you to stop. In some cases, you may be given additional medicines to help with side effects. Follow all directions for their use. Call your doctor or health care professional for advice if you get a fever, chills or sore throat, or other symptoms of a cold or flu. Do not treat yourself. This drug decreases your body's ability to fight infections. Try to avoid being around people who are sick. This medicine may increase your risk to bruise or bleed. Call your doctor or health care professional if you notice any unusual bleeding. Be careful brushing and flossing your teeth or using a toothpick because you may get an infection or bleed more easily. If you have any dental work done, tell your dentist you are receiving this medicine. Avoid taking products that contain aspirin, acetaminophen, ibuprofen, naproxen, or ketoprofen unless instructed by your doctor. These medicines may hide a fever. Do not become pregnant while taking this medicine. Women should inform their doctor if they wish to become pregnant or think they might be pregnant. There is a potential for serious side effects to an unborn child. Talk to your health care professional or pharmacist for more information. Do not breast-feed an infant while taking this medicine. Men are advised not to father a child while receiving this medicine. What side effects may I notice from receiving this medicine? Side effects that you should report to your doctor or health care professional as soon as possible: -allergic reactions like skin rash, itching or hives, swelling of the face, lips, or tongue -low blood counts - This drug may decrease the number of white blood cells, red blood cells and platelets. You may be at increased risk for infections and bleeding. -signs of infection - fever or chills, cough, sore throat, pain or difficulty passing urine -signs of decreased platelets or bleeding - bruising, pinpoint red spots on the skin, black, tarry  stools, nosebleeds -signs of decreased red blood cells - unusually weak or tired, fainting spells, lightheadedness -breathing problems -chest pain -high or low blood pressure -mouth sores -nausea and vomiting -pain, swelling, redness or irritation at the injection site -pain, tingling, numbness in the hands or feet -slow or irregular heartbeat -swelling of the ankle, feet, hands Side effects that usually do not require medical attention (report to your doctor or health care professional if they continue or are bothersome): -bone pain -complete hair loss including hair on your head, underarms, pubic hair, eyebrows, and eyelashes -changes in the color of fingernails -diarrhea -loosening of the fingernails -loss of appetite -muscle or joint pain -red flush to skin -sweating This list may not describe all possible side effects. Call your doctor for medical advice about side effects. You may report side effects to FDA at 1-800-FDA-1088. Where should I keep my medicine? This drug is given in a hospital or clinic and will not be stored at home. NOTE: This sheet is a summary. It may not cover all possible information. If you have questions about this medicine, talk to your doctor, pharmacist, or health care provider.  2012, [employer]. (06/14/2008 11:54:26 AM)    Carboplatin injection What is this medicine? CARBOPLATIN (KAR boe pla tin) is a chemotherapy drug. It targets fast dividing cells, like cancer cells, and causes these cells to die. This medicine is used to treat ovarian cancer and many other cancers. This medicine may be used for other purposes; ask your health care provider or pharmacist if you have questions. What should I tell  my health care provider before I take this medicine? They need to know if you have any of these conditions: -blood disorders -hearing problems -kidney disease -recent or ongoing radiation therapy -an unusual or allergic reaction to  carboplatin, cisplatin, other chemotherapy, other medicines, foods, dyes, or preservatives -pregnant or trying to get pregnant -breast-feeding How should I use this medicine? This drug is usually given as an infusion into a vein. It is administered in a hospital or clinic by a specially trained health care professional. Talk to your pediatrician regarding the use of this medicine in children. Special care may be needed. Overdosage: If you think you have taken too much of this medicine contact a poison control center or emergency room at once. NOTE: This medicine is only for you. Do not share this medicine with others. What if I miss a dose? It is important not to miss a dose. Call your doctor or health care professional if you are unable to keep an appointment. What may interact with this medicine? -medicines for seizures -medicines to increase blood counts like filgrastim, pegfilgrastim, sargramostim -some antibiotics like amikacin, gentamicin, neomycin, streptomycin, tobramycin -vaccines Talk to your doctor or health care professional before taking any of these medicines: -acetaminophen -aspirin -ibuprofen -ketoprofen -naproxen This list may not describe all possible interactions. Give your health care provider a list of all the medicines, herbs, non-prescription drugs, or dietary supplements you use. Also tell them if you smoke, drink alcohol, or use illegal drugs. Some items may interact with your medicine. What should I watch for while using this medicine? Your condition will be monitored carefully while you are receiving this medicine. You will need important blood work done while you are taking this medicine. This drug may make you feel generally unwell. This is not uncommon, as chemotherapy can affect healthy cells as well as cancer cells. Report any side effects. Continue your course of treatment even though you feel ill unless your doctor tells you to stop. In some cases, you may  be given additional medicines to help with side effects. Follow all directions for their use. Call your doctor or health care professional for advice if you get a fever, chills or sore throat, or other symptoms of a cold or flu. Do not treat yourself. This drug decreases your body's ability to fight infections. Try to avoid being around people who are sick. This medicine may increase your risk to bruise or bleed. Call your doctor or health care professional if you notice any unusual bleeding. Be careful brushing and flossing your teeth or using a toothpick because you may get an infection or bleed more easily. If you have any dental work done, tell your dentist you are receiving this medicine. Avoid taking products that contain aspirin, acetaminophen, ibuprofen, naproxen, or ketoprofen unless instructed by your doctor. These medicines may hide a fever. Do not become pregnant while taking this medicine. Women should inform their doctor if they wish to become pregnant or think they might be pregnant. There is a potential for serious side effects to an unborn child. Talk to your health care professional or pharmacist for more information. Do not breast-feed an infant while taking this medicine. What side effects may I notice from receiving this medicine? Side effects that you should report to your doctor or health care professional as soon as possible: -allergic reactions like skin rash, itching or hives, swelling of the face, lips, or tongue -signs of infection - fever or chills, cough, sore throat, pain or  difficulty passing urine -signs of decreased platelets or bleeding - bruising, pinpoint red spots on the skin, black, tarry stools, nosebleeds -signs of decreased red blood cells - unusually weak or tired, fainting spells, lightheadedness -breathing problems -changes in hearing -changes in vision -chest pain -high blood pressure -low blood counts - This drug may decrease the number of white blood  cells, red blood cells and platelets. You may be at increased risk for infections and bleeding. -nausea and vomiting -pain, swelling, redness or irritation at the injection site -pain, tingling, numbness in the hands or feet -problems with balance, talking, walking -trouble passing urine or change in the amount of urine Side effects that usually do not require medical attention (report to your doctor or health care professional if they continue or are bothersome): -hair loss -loss of appetite -metallic taste in the mouth or changes in taste This list may not describe all possible side effects. Call your doctor for medical advice about side effects. You may report side effects to FDA at 1-800-FDA-1088. Where should I keep my medicine? This drug is given in a hospital or clinic and will not be stored at home. NOTE: This sheet is a summary. It may not cover all possible information. If you have questions about this medicine, talk to your doctor, pharmacist, or health care provider.  2012, [employer]. (10/07/2007 2:38:05 PM)   Ondansetron injection (Zofran) What is this medicine? ONDANSETRON (on DAN se tron) is used to treat nausea and vomiting caused by chemotherapy. It is also used to prevent or treat nausea and vomiting after surgery. This medicine may be used for other purposes; ask your health care provider or pharmacist if you have questions. What should I tell my health care provider before I take this medicine? They need to know if you have any of these conditions: -heart disease -history of irregular heartbeat -liver disease -low levels of magnesium or potassium in the blood -an unusual or allergic reaction to ondansetron, granisetron, other medicines, foods, dyes, or preservatives -pregnant or trying to get pregnant -breast-feeding How should I use this medicine? This medicine is for infusion into a vein. It is given by a health care professional in a hospital or  clinic setting. Talk to your pediatrician regarding the use of this medicine in children. Special care may be needed. Overdosage: If you think you have taken too much of this medicine contact a poison control center or emergency room at once. NOTE: This medicine is only for you. Do not share this medicine with others. What if I miss a dose? This does not apply. What may interact with this medicine? Do not take this medicine with any of the following medications: -apomorphine  This medicine may also interact with the following medications: -carbamazepine -phenytoin -rifampicin -tramadol This list may not describe all possible interactions. Give your health care provider a list of all the medicines, herbs, non-prescription drugs, or dietary supplements you use. Also tell them if you smoke, drink alcohol, or use illegal drugs. Some items may interact with your medicine. What should I watch for while using this medicine? Your condition will be monitored carefully while you are receiving this medicine. What side effects may I notice from receiving this medicine? Side effects that you should report to your doctor or health care professional as soon as possible: -allergic reactions like skin rash, itching or hives, swelling of the face, lips, or tongue -breathing problems -dizziness -fast or irregular heartbeat -feeling faint or lightheaded, falls -fever and  chills -swelling of the hands and feet -tightness in the chest Side effects that usually do not require medical attention (report to your doctor or health care professional if they continue or are bothersome): -constipation or diarrhea -headache This list may not describe all possible side effects. Call your doctor for medical advice about side effects. You may report side effects to FDA at 1-800-FDA-1088. Where should I keep my medicine? This drug is given in a hospital or clinic and will not be stored at home. NOTE: This sheet is a  summary. It may not cover all possible information. If you have questions about this medicine, talk to your doctor, pharmacist, or health care provider.  2012, [employer]. (04/05/2010 11:37:58 AM)

## 2012-12-16 NOTE — Progress Notes (Signed)
OFFICE PROGRESS NOTE  CC  Nancy Mew, MD [address] [place] [address] Dr. Lurline Hare  Dr. Avel Peace  DIAGNOSIS: 57 year old female with new diagnosis of stage II a breast cancer that is triple negative    STAGE:  Cancer of lower-inner quadrant of female breast  Primary site: Breast (Right)  Staging method: AJCC 7th Edition  Clinical: Stage IIA (T2, N0, cM0)  Summary: Stage IA (T2, N0, cM0)  PRIOR THERAPY: #1Patient's last mammogram was about 5 years ago. Recently she underwent a screening mammogram that showed a spiculated mass in the lower inner quadrant of the right breast. She had ultrasound performed that showed irregular mass at the 5:00 position 3 cm from the nipple measuring 1.1 cm. She had a needle core biopsy performed on 06/19/2012 the biopsy showed invasive ductal carcinoma with ductal carcinoma in situ grade 3 tumor was ER negative PR negative HER-2/neu negative. Ki-67 was elevated at 34%. She went on to have MRI of the breasts performed on 06/24/2012 the MRI showed in the middle third of the lower inner quadrant of the right breast a 1.5 x 1.3 x 1.9 cm irregular enhancing mass. No abnormal enhancement was seen in the left breast no enlarged axillary or internal mammary adenopathy was detected.   #2 patient is now status post bilateral mastectomies. On her right mastectomy she was found to have a 2.3 cm invasive ductal carcinoma 01 lymph nodes were positive for metastatic disease and this is T2 N0) stage II. Tumor was grade 3 ER 2% PR negative HER-2/neu negative with a Ki-67 at 34%. Left mastectomy only showed intraductal papilloma node atypia or malignancy was found.  #3 patient is now being considered for adjuvant chemotherapy since her tumor is essentially triple negative and is a stage II. We discussed the rationale for adjuvant chemotherapy. She will receive Adriamycin Cytoxan every 2 weeks for a total of 4 cycles with G-CSF support. Once  she completes this then she will receive Taxol and carboplatinum every week for a total of 12 weeks. I did discuss with her use of antiestrogen therapy such as an aromatase inhibitor since her tumor is 2% ER positive. I discussed the rationale for this.  CURRENT THERAPY: Taxol Carbo week 1  INTERVAL HISTORY: Nancy Beard 57 y.o. female returns for followup of her right breast cancer.  She is doing well today.  She is here for her next chemotherapy regimen.  She has mild numbness in her right thumb and index finger.  It is very mild and has no motor effects.  Otherwise, a 10 point ROS is neg.    MEDICAL HISTORY: Past Medical History  Diagnosis Date  . Thyroid disease   . Depression   . Sprue   . Diverticulosis   . Hyperlipidemia   . Breast cancer     right  . Hypothyroidism   . Pneumonia     ALLERGIES:  is allergic to ciprofloxacin and gluten meal.  MEDICATIONS:  Current Outpatient Prescriptions  Medication Sig Dispense Refill  . dexamethasone (DECADRON) 4 MG tablet Take 2 tablets (8 mg total) by mouth 2 (two) times daily with a meal. Take two times a day starting the day after chemotherapy for 3 days.  30 tablet  1  . HYDROcodone-acetaminophen (NORCO/VICODIN) 5-325 MG per tablet Take 1 tablet by mouth every 6 (six) hours as needed.  30 tablet  0  . levothyroxine (SYNTHROID, LEVOTHROID) 25 MCG tablet Take 25 mcg by mouth daily.      [name]  lidocaine-prilocaine (EMLA) cream Apply topically as needed.  30 g  8  . liothyronine (CYTOMEL) 25 MCG tablet Take 12.5 mcg by mouth daily.      [name] LORazepam (ATIVAN) 0.5 MG tablet Take 1 tablet (0.5 mg total) by mouth every 6 (six) hours as needed (Nausea or vomiting).  30 tablet  0  . Multiple Vitamin (MULTIVITAMIN) tablet Take 1 tablet by mouth daily.      . ondansetron (ZOFRAN) 8 MG tablet Take 1 tablet (8 mg total) by mouth 2 (two) times daily. Take two times a day starting the day after chemo for 3 days. Then take two times a day as needed for  nausea or vomiting.  30 tablet  1  . prochlorperazine (COMPAZINE) 10 MG tablet Take 1 tablet (10 mg total) by mouth every 6 (six) hours as needed (Nausea or vomiting).  30 tablet  1  . prochlorperazine (COMPAZINE) 25 MG suppository Place 1 suppository (25 mg total) rectally every 12 (twelve) hours as needed for nausea.  12 suppository  3  . Sulfamethoxazole-Trimethoprim (BACTRIM PO) Take by mouth 2 (two) times daily.      . valACYclovir (VALTREX) 500 MG tablet Take 1 tablet (500 mg total) by mouth 2 (two) times daily.  60 tablet  6  . venlafaxine XR (EFFEXOR-XR) 75 MG 24 hr capsule Take 1 capsule (75 mg total) by mouth daily.  90 capsule  3  . acetaminophen (TYLENOL) 325 MG tablet Take 650 mg by mouth as needed.      [name] levofloxacin (LEVAQUIN) 500 MG tablet Take 1 tablet (500 mg total) by mouth daily.  7 tablet  2  . omeprazole (PRILOSEC) 20 MG capsule Take 1 capsule (20 mg total) by mouth daily.  30 capsule  6   No current facility-administered medications for this visit.    SURGICAL HISTORY:  Past Surgical History  Procedure Laterality Date  . Esophagogastroduodenoscopy  2007    sprue  . Colonoscopy  2006  . Cryoblation of cervix    . Breast lumpectomy    . Total mastectomy Bilateral 09/11/2012    Procedure: bilateral MASTECTOMY;  Surgeon: Adolph Pollack, MD;  Location: [facility] OR;  Service: General;  Laterality: Bilateral;  . Axillary sentinel node biopsy Right 09/11/2012    Procedure: AXILLARY SENTINEL lymph NODE  BIOPSY;  Surgeon: Adolph Pollack, MD;  Location: [facility] OR;  Service: General;  Laterality: Right;  right nuclear medicine injection 12:30   . Breast reconstruction with placement of tissue expander and flex hd (acellular hydrated dermis) Bilateral 09/11/2012    Procedure: BREAST RECONSTRUCTION WITH PLACEMENT OF TISSUE EXPANDER AND FLEX HD (ACELLULAR HYDRATED DERMIS) ADM;  Surgeon: Wayland Denis, DO;  Location: [facility] OR;  Service: Plastics;  Laterality: Bilateral;  . Incision and  drainage of wound Left 09/16/2012    Procedure: Left Breast Evacuation of Hematoma;  Surgeon: Wayland Denis, DO;  Location: [facility] OR;  Service: Plastics;  Laterality: Left;  . Portacath placement Right 10/09/2012    Procedure: US GUIDED INSERTION PORT-A-CATH;  Surgeon: Adolph Pollack, MD;  Location: [facility];  Service: General;  Laterality: Right;  Right Subclavian Vein    REVIEW OF SYSTEMS:   General: fatigue (+), night sweats (-), fever (-), pain (-) Lymph: palpable nodes (-) HEENT: vision changes (-), mucositis (-), gum bleeding (-), epistaxis (-) Cardiovascular: chest pain (-), palpitations (-) Pulmonary: shortness of breath (-), dyspnea on exertion (-), cough (-), hemoptysis (-) GI:  Early satiety (-), melena (-),  dysphagia (-), nausea/vomiting (-), diarrhea (-) GU: dysuria (-), hematuria (-), incontinence (-) Musculoskeletal: joint swelling (-), joint pain (-), back pain (-) Neuro: weakness (-), numbness (-), headache (-), confusion (-) Skin: Rash (-), lesions (-), dryness (-) Psych: depression (-), suicidal/homicidal ideation (-), feeling of hopelessness (-)   PHYSICAL EXAMINATION: Blood pressure 107/70, pulse 101, temperature 98.6 F (37 C), temperature source Oral, resp. rate 20, height 5\' 5"  (1.651 m), weight 138 lb 8 oz (62.823 kg), last menstrual period 09/11/2012. Body mass index is 23.05 kg/(m^2). General: Patient is a well appearing female in no acute distress HEENT: PERRLA, sclerae anicteric no conjunctival pallor, MMM Neck: supple, no palpable adenopathy Lungs: clear to auscultation bilaterally, no wheezes, rhonchi, or rales Cardiovascular: regular rate rhythm, S1, S2, no murmurs, rubs or gallops, HR 96 recheck Abdomen: Soft, non-tender, non-distended, normoactive bowel sounds, no HSM Extremities: warm and well perfused, no clubbing, cyanosis, or edema Skin: No rashes or lesions, scalp erythema appears to be a birthmark Neuro: Non-focal Breasts:  bilateral mastectomy sites are well healed, no nodularity, masses, or skin changes, expanders in place. ECOG PERFORMANCE STATUS: 1 - Symptomatic but completely ambulatory  LABORATORY DATA: Lab Results  Component Value Date   WBC 8.1 12/16/2012   HGB 9.9* 12/16/2012   HCT 30.0* 12/16/2012   MCV 81.7 12/16/2012   PLT 382 12/16/2012      Chemistry      Component Value Date/Time   NA 138 12/03/2012 1002   NA 138 09/15/2012 2035   K 4.2 12/03/2012 1002   K 3.6 09/15/2012 2035   CL 103 12/03/2012 1002   CL 103 09/15/2012 2035   CO2 25 12/03/2012 1002   CO2 26 09/15/2012 2035   BUN 12.7 12/03/2012 1002   BUN 4* 09/15/2012 2035   CREATININE 0.7 12/03/2012 1002   CREATININE 0.54 09/15/2012 2035      Component Value Date/Time   CALCIUM 9.4 12/03/2012 1002   CALCIUM 9.0 09/15/2012 2035   ALKPHOS 207* 12/03/2012 1002   ALKPHOS 142* 09/15/2012 2035   AST 46* 12/03/2012 1002   AST 32 09/15/2012 2035   ALT 82* 12/03/2012 1002   ALT 24 09/15/2012 2035   BILITOT 0.57 12/03/2012 1002   BILITOT 0.8 09/15/2012 2035     ADDITIONAL INFORMATION: 3. CHROMOGENIC IN-SITU HYBRIDIZATION Interpretation HER-2/NEU BY CISH - NO AMPLIFICATION OF HER-2 DETECTED. THE RATIO OF HER-2: CEP 17 SIGNALS WAS 1.52. Reference range: Ratio: HER2:CEP17 < 1.8 - gene amplification not observed Ratio: HER2:CEP 17 1.8-2.2 - equivocal result Ratio: HER2:CEP17 > 2.2 - gene amplification observed Pecola Leisure MD Pathologist, Electronic Signature ( Signed 09/22/2012) 3. PROGNOSTIC INDICATORS - ACIS Results IMMUNOHISTOCHEMICAL AND MORPHOMETRIC ANALYSIS BY THE AUTOMATED CELLULAR IMAGING SYSTEM (ACIS) Estrogen Receptor (Negative, <1%): 2%, POSITIVE, WEAK STAINING INTENSITY Progesterone Receptor (Negative, <1%): 0%, NEGATIVE COMMENT: The negative hormone receptor study in this case has an internal positive control. All controls stained appropriately Pecola Leisure MD Pathologist, Electronic Signature ( Signed 09/22/2012) 1 of 4 FINAL for Schake, Abegail  A [phone]) FINAL DIAGNOSIS Diagnosis 1. Lymph node, sentinel, biopsy, Right axilla - ONE LYMPH NODE, NEGATIVE FOR TUMOR (0/1). 2. Breast, simple mastectomy, Left - BENIGN BREAST TISSUE WITH INTRADUCTAL PAPILLOMA, SEE COMMENT. - NEGATIVE FOR ATYPIA OR MALIGNANCY. - SURGICAL MARGINS, NEGATIVE FOR ATYPIA OR MALIGNANCY. - MICROCALCIFICATIONS IDENTIFIED. 3. Breast, simple mastectomy, Right - INVASIVE DUCTAL CARCINOMA, GRADE III, WITH SPINDLE CELL DIFFERENTIATION (METAPLASTIC CARCINOMA) (2.3CM), SEE COMMENT. - NO LYMPHOVASCULAR INVASION IDENTIFIED. - INVASIVE TUMOR IS 1.5 CM FROM  NEAREST MARGIN (DEEP). - DUCTAL CARCINOMA IN SITU, GRADE III, WITH COMEDONECROSIS. - SEE TUMOR SYNOPTIC TEMPLATE BELOW. Microscopic Comment 2. Although there was no mass grossly identified, there are definitive morphologic features of intraductal papilloma with associated fibrocystic change present. In addition, there are fibrocystic changes with usual ductal hyperplasia in representative sections not involved by papilloma. Finally, foci of sclerosing adenosis and microcalcifications in benign ducts and lobules are present. The surgical resection margin(s) of the specimen were inked and microscopically evaluated. 3. BREAST, INVASIVE TUMOR, WITH LYMPH NODE SAMPLING Specimen, including laterality: Right breast. Procedure: Simple mastectomy. Grade: III of III. Tubule formation: 3. Nuclear pleomorphism: 3. Mitotic: 2. Tumor size (gross measurement): 2.3 cm. Margins: Invasive, distance to closest margin: 1.5 cm. In-situ, distance to closest margin: 1.5 cm (deep). If margin positive, focally or broadly: N/A. Lymphovascular invasion: Absent. Ductal carcinoma in situ: Present. Grade: III of III. Extensive intraductal component: Absent. Lobular neoplasia: Absent. Tumor focality: Unifocal. Treatment effect: None. If present, treatment effect in breast tissue, lymph nodes or both: N/A. Extent of tumor: Skin and  Nipple: Grossly negative. Skeletal muscle: N/A. Lymph nodes: # examined: 1. Lymph nodes with metastasis: 0. Breast prognostic profile: Estrogen receptor: Repeated; previous study demonstrates 0% positivity ([id number]). Progesterone receptor: Repeated; previous study demonstrates 0% positivity ([id number]). HER-2/neu: Repeated; previous study demonstrated no amplification (1.55) ([id number]). 2 of 4 FINAL for CHEROLYN, BEHRLE A [phone]) Microscopic Comment(continued) Ki-67: Not repeated; previous study demonstrates 34% proliferation rate [phone]). Non-neoplastic breast: Intraductal papilloma with fibrocystic change and usual ductal hyperplasia, fibrocystic change with usual ductal hyperplasia, microcalcifications, and previous biopsy site. TNM: pT2, pN0, pMX. Comments: Although there are definitive features of high grade invasive ductal carcinoma identified, there are multiple foci where the carcinoma assumes a spindle cell morphology / spindle cell differentiation. 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 There are no heterologous elements identified. (CRR:eps 09/15/12)  RADIOGRAPHIC STUDIES:  Chest 2 View  09/08/2012  *RADIOLOGY REPORT*  Clinical Data: Right-sided breast  CHEST - 2 VIEW  Comparison: Chest x-ray 12/06/2009.  Findings: Lung volumes are normal.  No consolidative airspace disease.  No pleural effusions.  No pneumothorax.  No pulmonary nodule or mass noted.  Pulmonary vasculature and the cardiomediastinal silhouette are within normal limits.  Mild bilateral apical pleuroparenchymal thickening most compatible with chronic scarring, unchanged compared to the prior examination.  IMPRESSION: 1. No radiographic evidence of acute cardiopulmonary disease.   Original Report Authenticated By: Trudie Reed, M.D.    Nm Sentinel Node Inj-no Rpt (breast)  09/11/2012  CLINICAL DATA: Invasive right  breast cancer   Sulfur colloid was injected intradermally by the nuclear medicine  technologist for breast cancer sentinel node localization.      ASSESSMENT: 57 year old female with  #1 invasive ductal carcinoma of the right breast status post bilateral mastectomies for a 2.3 cm node negative essentially triple negative disease on the right. Without any evidence of malignancy on the left. Patient's a candidate for adjuvant chemotherapy. We discussed Adriamycin Cytoxan dose dense followed by weekly Taxol Carbo. Since her tumor was 2% estrogen receptor positive we will utilize an aromatase inhibitor for 5 years. She understands the rationale for this.  #2 patient is also having considerable anxiety and will take Effexor daily.    #4 she also began Prilosec 20 mg daily for reflux.  PLAN:   #1 Doing well. Ms. Freels labs have recovered.  She will proceed with chemotherapy.  She will  take Super B complex daily.  We reviewed common side effects of the chemotherapy and I also gave her information on Taxol/Carbo on her AVS.    #2 She will return in one week's time for labs and an appointment for evaluation, and chemotherapy.    All questions were answered. The patient knows to call the clinic with any problems, questions or concerns. We can certainly see the patient much sooner if necessary.  I spent 25 minutes counseling the patient face to face. The total time spent in the appointment was 30 minutes.  Cherie Ouch Lyn Hollingshead, NP Medical Oncology [facility] Phone: [phone] 12/16/2012, 1:22 PM

## 2012-12-17 ENCOUNTER — Telehealth: Payer: Self-pay | Admitting: *Deleted

## 2012-12-17 NOTE — Telephone Encounter (Signed)
Message copied by Augusto Garbe on Wed Dec 17, 2012 12:26 PM ------      Message from: Kallie Locks      Created: Tue Dec 16, 2012  4:57 PM      Regarding: "1st time chemotherapy"      Contact: [phone]       Patient received Taxol and Carboplatin for the first time today, per Dr. Welton Flakes; tolerated treatment well. ------

## 2012-12-17 NOTE — Telephone Encounter (Signed)
Nancy Beard is doing well.  Reports no nausea but has taken a zofran today at 10:00 at 10:00 am. Has eaten and drinking fluids with no problems.  Denies feeling tired yet and asked if this new treatment will "pull down my counts".  Has started a MVI and a B complex vitamin to counter any numbness and tingling to hands and feet.  No further questions or concerns.

## 2012-12-18 ENCOUNTER — Telehealth: Payer: Self-pay | Admitting: *Deleted

## 2012-12-18 NOTE — Telephone Encounter (Signed)
F/u with pt after receiving Taxol treatment.  Pt denies needs or concerns at this time.  Gave emotional support.  Encourage pt to call with questions.  Received verbal understanding.  Contact information given.

## 2012-12-21 NOTE — Progress Notes (Signed)
OFFICE PROGRESS NOTE  CC  [name], MD [address] [place] [address] Dr. Lurline Hare  Dr. Avel Peace  DIAGNOSIS: 57 year old female with new diagnosis of stage II a breast cancer that is triple negative    STAGE:  Cancer of lower-inner quadrant of female breast  Primary site: Breast (Right)  Staging method: AJCC 7th Edition  Clinical: Stage IIA (T2, N0, cM0)  Summary: Stage IA (T2, N0, cM0)  PRIOR THERAPY: #1Patient's last mammogram was about 5 years ago. Recently she underwent a screening mammogram that showed a spiculated mass in the lower inner quadrant of the right breast. She had ultrasound performed that showed irregular mass at the 5:00 position 3 cm from the nipple measuring 1.1 cm. She had a needle core biopsy performed on 06/19/2012 the biopsy showed invasive ductal carcinoma with ductal carcinoma in situ grade 3 tumor was ER negative PR negative HER-2/neu negative. Ki-67 was elevated at 34%. She went on to have MRI of the breasts performed on 06/24/2012 the MRI showed in the middle third of the lower inner quadrant of the right breast a 1.5 x 1.3 x 1.9 cm irregular enhancing mass. No abnormal enhancement was seen in the left breast no enlarged axillary or internal mammary adenopathy was detected.   #2 patient is now status post bilateral mastectomies. On her right mastectomy she was found to have a 2.3 cm invasive ductal carcinoma 01 lymph nodes were positive for metastatic disease and this is T2 N0) stage II. Tumor was grade 3 ER 2% PR negative HER-2/neu negative with a Ki-67 at 34%. Left mastectomy only showed intraductal papilloma node atypia or malignancy was found.  #3 patient is now being considered for adjuvant chemotherapy since her tumor is essentially triple negative and is a stage II. We discussed the rationale for adjuvant chemotherapy. She will receive Adriamycin Cytoxan every 2 weeks for a total of 4 cycles with G-CSF support. Once  she completes this then she will receive Taxol and carboplatinum every week for a total of 12 weeks. I did discuss with her use of antiestrogen therapy such as an aromatase inhibitor since her tumor is 2% ER positive. I discussed the rationale for this.  #4 patient is now status post 4 cycles of adjuvant Adriamycin Cytoxan administered every 2 weeks for a total of 4 cycles with Neulasta support from 10/15/2012 through 12/03/2012.  #5 patient will begin Taxol carboplatinum weekly for a total of 12 weeks starting 12/16/2012.  CURRENT THERAPY:Cycle 4 day 1 Adriamycin Cytoxan with day 2 Neulasta.  INTERVAL HISTORY: Nancy Beard 57 y.o. female returns for followup. She has now completed 4 cycles of adjuvant Adriamycin Cytoxan that was given dose dense. Overall she tolerated it well. Today she seems to be doing well.her ANC and white count is low due to recent chemotherapy given on 11/26/2012. She denies having any fevers chills night sweats headaches shortness of breath chest pains palpitations no myalgias and arthralgias. Remainder of the 10 point review of systems is unremarkable.  MEDICAL HISTORY: Past Medical History  Diagnosis Date  . Thyroid disease   . Depression   . Sprue   . Diverticulosis   . Hyperlipidemia   . Breast cancer     right  . Hypothyroidism   . Pneumonia     ALLERGIES:  is allergic to ciprofloxacin and gluten meal.  MEDICATIONS:  Current Outpatient Prescriptions  Medication Sig Dispense Refill  . levofloxacin (LEVAQUIN) 500 MG tablet Take 1 tablet (500 mg  total) by mouth daily.  7 tablet  2  . levothyroxine (SYNTHROID, LEVOTHROID) 25 MCG tablet Take 25 mcg by mouth daily.      [name] liothyronine (CYTOMEL) 25 MCG tablet Take 12.5 mcg by mouth daily.      . Multiple Vitamin (MULTIVITAMIN) tablet Take 1 tablet by mouth daily.      [name] omeprazole (PRILOSEC) 20 MG capsule Take 1 capsule (20 mg total) by mouth daily.  30 capsule  6  . valACYclovir (VALTREX) 500 MG tablet  Take 1 tablet (500 mg total) by mouth 2 (two) times daily.  60 tablet  6  . venlafaxine XR (EFFEXOR-XR) 75 MG 24 hr capsule Take 1 capsule (75 mg total) by mouth daily.  90 capsule  3  . acetaminophen (TYLENOL) 325 MG tablet Take 650 mg by mouth as needed.      [name] dexamethasone (DECADRON) 4 MG tablet Take 2 tablets (8 mg total) by mouth 2 (two) times daily with a meal. Take two times a day starting the day after chemotherapy for 3 days.  30 tablet  1  . HYDROcodone-acetaminophen (NORCO/VICODIN) 5-325 MG per tablet Take 1 tablet by mouth every 6 (six) hours as needed.  30 tablet  0  . lidocaine-prilocaine (EMLA) cream Apply topically as needed.  30 g  8  . LORazepam (ATIVAN) 0.5 MG tablet Take 1 tablet (0.5 mg total) by mouth every 6 (six) hours as needed (Nausea or vomiting).  30 tablet  0  . ondansetron (ZOFRAN) 8 MG tablet Take 1 tablet (8 mg total) by mouth 2 (two) times daily. Take two times a day starting the day after chemo for 3 days. Then take two times a day as needed for nausea or vomiting.  30 tablet  1  . prochlorperazine (COMPAZINE) 10 MG tablet Take 1 tablet (10 mg total) by mouth every 6 (six) hours as needed (Nausea or vomiting).  30 tablet  1  . prochlorperazine (COMPAZINE) 25 MG suppository Place 1 suppository (25 mg total) rectally every 12 (twelve) hours as needed for nausea.  12 suppository  3  . Sulfamethoxazole-Trimethoprim (BACTRIM PO) Take by mouth 2 (two) times daily.       No current facility-administered medications for this visit.    SURGICAL HISTORY:  Past Surgical History  Procedure Laterality Date  . Esophagogastroduodenoscopy  2007    sprue  . Colonoscopy  2006  . Cryoblation of cervix    . Breast lumpectomy    . Total mastectomy Bilateral 09/11/2012    Procedure: bilateral MASTECTOMY;  Surgeon: Adolph Pollack, MD;  Location: [facility] OR;  Service: General;  Laterality: Bilateral;  . Axillary sentinel node biopsy Right 09/11/2012    Procedure: AXILLARY SENTINEL  lymph NODE  BIOPSY;  Surgeon: Adolph Pollack, MD;  Location: [facility] OR;  Service: General;  Laterality: Right;  right nuclear medicine injection 12:30   . Breast reconstruction with placement of tissue expander and flex hd (acellular hydrated dermis) Bilateral 09/11/2012    Procedure: BREAST RECONSTRUCTION WITH PLACEMENT OF TISSUE EXPANDER AND FLEX HD (ACELLULAR HYDRATED DERMIS) ADM;  Surgeon: Wayland Denis, DO;  Location: [facility] OR;  Service: Plastics;  Laterality: Bilateral;  . Incision and drainage of wound Left 09/16/2012    Procedure: Left Breast Evacuation of Hematoma;  Surgeon: Wayland Denis, DO;  Location: [facility] OR;  Service: Plastics;  Laterality: Left;  . Portacath placement Right 10/09/2012    Procedure: US GUIDED INSERTION PORT-A-CATH;  Surgeon: Adolph Pollack, MD;  Location:  [facility];  Service: General;  Laterality: Right;  Right Subclavian Vein    REVIEW OF SYSTEMS:   General: fatigue (+), night sweats (-), fever (-), pain (-) Lymph: palpable nodes (-) HEENT: vision changes (-), mucositis (-), gum bleeding (-), epistaxis (-) Cardiovascular: chest pain (-), palpitations (-) Pulmonary: shortness of breath (-), dyspnea on exertion (-), cough (-), hemoptysis (-) GI:  Early satiety (-), melena (-), dysphagia (-), nausea/vomiting (-), diarrhea (-) GU: dysuria (-), hematuria (-), incontinence (-) Musculoskeletal: joint swelling (-), joint pain (-), back pain (-) Neuro: weakness (-), numbness (-), headache (-), confusion (-) Skin: Rash (-), lesions (-), dryness (-) Psych: depression (-), suicidal/homicidal ideation (-), feeling of hopelessness (-)   PHYSICAL EXAMINATION: Blood pressure 92/70, pulse 126, temperature 98.4 F (36.9 C), temperature source Oral, resp. rate 20, height 5\' 5"  (1.651 m), weight 135 lb 9.6 oz (61.508 kg), last menstrual period 09/11/2012. Body mass index is 22.57 kg/(m^2). General: Patient is a well appearing female in no acute distress HEENT: PERRLA,  sclerae anicteric no conjunctival pallor, MMM Neck: supple, no palpable adenopathy Lungs: clear to auscultation bilaterally, no wheezes, rhonchi, or rales Cardiovascular: regular rate rhythm, S1, S2, no murmurs, rubs or gallops, HR 96 recheck Abdomen: Soft, non-tender, non-distended, normoactive bowel sounds, no HSM Extremities: warm and well perfused, no clubbing, cyanosis, or edema Skin: No rashes or lesions, scalp erythema appears to be a birthmark Neuro: Non-focal Breasts: bilateral mastectomy sites are well healed, no nodularity, masses, or skin changes, expanders in place. ECOG PERFORMANCE STATUS: 1 - Symptomatic but completely ambulatory  LABORATORY DATA: Lab Results  Component Value Date   WBC 8.1 12/16/2012   HGB 9.9* 12/16/2012   HCT 30.0* 12/16/2012   MCV 81.7 12/16/2012   PLT 382 12/16/2012      Chemistry      Component Value Date/Time   NA 139 12/16/2012 1223   NA 138 09/15/2012 2035   K 4.1 12/16/2012 1223   K 3.6 09/15/2012 2035   CL 104 12/16/2012 1223   CL 103 09/15/2012 2035   CO2 26 12/16/2012 1223   CO2 26 09/15/2012 2035   BUN 9.7 12/16/2012 1223   BUN 4* 09/15/2012 2035   CREATININE 0.5* 12/16/2012 1223   CREATININE 0.54 09/15/2012 2035      Component Value Date/Time   CALCIUM 9.1 12/16/2012 1223   CALCIUM 9.0 09/15/2012 2035   ALKPHOS 185* 12/16/2012 1223   ALKPHOS 142* 09/15/2012 2035   AST 43* 12/16/2012 1223   AST 32 09/15/2012 2035   ALT 45 12/16/2012 1223   ALT 24 09/15/2012 2035   BILITOT 0.25 12/16/2012 1223   BILITOT 0.8 09/15/2012 2035     ADDITIONAL INFORMATION: 3. CHROMOGENIC IN-SITU HYBRIDIZATION Interpretation HER-2/NEU BY CISH - NO AMPLIFICATION OF HER-2 DETECTED. THE RATIO OF HER-2: CEP 17 SIGNALS WAS 1.52. Reference range: Ratio: HER2:CEP17 < 1.8 - gene amplification not observed Ratio: HER2:CEP 17 1.8-2.2 - equivocal result Ratio: HER2:CEP17 > 2.2 - gene amplification observed Pecola Leisure MD Pathologist, Electronic Signature ( Signed 09/22/2012) 3. PROGNOSTIC  INDICATORS - ACIS Results IMMUNOHISTOCHEMICAL AND MORPHOMETRIC ANALYSIS BY THE AUTOMATED CELLULAR IMAGING SYSTEM (ACIS) Estrogen Receptor (Negative, <1%): 2%, POSITIVE, WEAK STAINING INTENSITY Progesterone Receptor (Negative, <1%): 0%, NEGATIVE COMMENT: The negative hormone receptor study in this case has an internal positive control. All controls stained appropriately Pecola Leisure MD Pathologist, Electronic Signature ( Signed 09/22/2012) 1 of 4 FINAL for Roback, Willie A [id number]) FINAL DIAGNOSIS Diagnosis 1. Lymph node, sentinel, biopsy,  Right axilla - ONE LYMPH NODE, NEGATIVE FOR TUMOR (0/1). 2. Breast, simple mastectomy, Left - BENIGN BREAST TISSUE WITH INTRADUCTAL PAPILLOMA, SEE COMMENT. - NEGATIVE FOR ATYPIA OR MALIGNANCY. - SURGICAL MARGINS, NEGATIVE FOR ATYPIA OR MALIGNANCY. - MICROCALCIFICATIONS IDENTIFIED. 3. Breast, simple mastectomy, Right - INVASIVE DUCTAL CARCINOMA, GRADE III, WITH SPINDLE CELL DIFFERENTIATION (METAPLASTIC CARCINOMA) (2.3CM), SEE COMMENT. - NO LYMPHOVASCULAR INVASION IDENTIFIED. - INVASIVE TUMOR IS 1.5 CM FROM NEAREST MARGIN (DEEP). - DUCTAL CARCINOMA IN SITU, GRADE III, WITH COMEDONECROSIS. - SEE TUMOR SYNOPTIC TEMPLATE BELOW. Microscopic Comment 2. Although there was no mass grossly identified, there are definitive morphologic features of intraductal papilloma with associated fibrocystic change present. In addition, there are fibrocystic changes with usual ductal hyperplasia in representative sections not involved by papilloma. Finally, foci of sclerosing adenosis and microcalcifications in benign ducts and lobules are present. The surgical resection margin(s) of the specimen were inked and microscopically evaluated. 3. BREAST, INVASIVE TUMOR, WITH LYMPH NODE SAMPLING Specimen, including laterality: Right breast. Procedure: Simple mastectomy. Grade: III of III. Tubule formation: 3. Nuclear pleomorphism: 3. Mitotic: 2. Tumor size (gross  measurement): 2.3 cm. Margins: Invasive, distance to closest margin: 1.5 cm. In-situ, distance to closest margin: 1.5 cm (deep). If margin positive, focally or broadly: N/A. Lymphovascular invasion: Absent. Ductal carcinoma in situ: Present. Grade: III of III. Extensive intraductal component: Absent. Lobular neoplasia: Absent. Tumor focality: Unifocal. Treatment effect: None. If present, treatment effect in breast tissue, lymph nodes or both: N/A. Extent of tumor: Skin and Nipple: Grossly negative. Skeletal muscle: N/A. Lymph nodes: # examined: 1. Lymph nodes with metastasis: 0. Breast prognostic profile: Estrogen receptor: Repeated; previous study demonstrates 0% positivity ([id number]). Progesterone receptor: Repeated; previous study demonstrates 0% positivity ([id number]). HER-2/neu: Repeated; previous study demonstrated no amplification (1.55) ([id number]). 2 of 4 FINAL for DEZI, BRAUNER A [phone]) Microscopic Comment(continued) Ki-67: Not repeated; previous study demonstrates 34% proliferation rate [phone]). Non-neoplastic breast: Intraductal papilloma with fibrocystic change and usual ductal hyperplasia, fibrocystic change with usual ductal hyperplasia, microcalcifications, and previous biopsy site. TNM: pT2, pN0, pMX. Comments: Although there are definitive features of high grade invasive ductal carcinoma identified, there are multiple foci where the carcinoma assumes a spindle cell morphology / spindle cell differentiation. 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 There are no heterologous elements identified. (CRR:eps 09/15/12)  RADIOGRAPHIC STUDIES:  Chest 2 View  09/08/2012  *RADIOLOGY REPORT*  Clinical Data: Right-sided breast  CHEST - 2 VIEW  Comparison: Chest x-ray 12/06/2009.  Findings: Lung volumes are normal.  No consolidative airspace disease.  No pleural effusions.  No  pneumothorax.  No pulmonary nodule or mass noted.  Pulmonary vasculature and the cardiomediastinal silhouette are within normal limits.  Mild bilateral apical pleuroparenchymal thickening most compatible with chronic scarring, unchanged compared to the prior examination.  IMPRESSION: 1. No radiographic evidence of acute cardiopulmonary disease.   Original Report Authenticated By: Trudie Reed, M.D.    Nm Sentinel Node Inj-no Rpt (breast)  09/11/2012  CLINICAL DATA: Invasive right breast cancer   Sulfur colloid was injected intradermally by the nuclear medicine  technologist for breast cancer sentinel node localization.      ASSESSMENT: 57 year old female with  #1 invasive ductal carcinoma of the right breast status post bilateral mastectomies for a 2.3 cm node negative essentially triple negative disease on the right. Without any evidence of malignancy on the left. She has now completed 4 cycles of dose dense Adriamycin and Cytoxanon 11/26/2012. She  tolerated it well.  #2 beginning 12/16/2012 she will begin weekly Taxol carboplatinum. She is being given this agent for total of 12 weeks.  #3 patient is also having considerable anxiety and will take Effexor daily.    #4 she also began Prilosec 20 mg daily for reflux.  #5 patient is now neutropenic she will receive prophylactic antibiotics. Neutropenic precautions were discussed with her.  PLAN:  #1 proceed with prophylactic antibiotics.  #2 she will return on June 3 4 weekly Taxol and carboplatinum.  #3 she knows to call with any fevers chills night sweats.  All questions were answered. The patient knows to call the clinic with any problems, questions or concerns. We can certainly see the patient much sooner if necessary.  I spent 25 minutes counseling the patient face to face. The total time spent in the appointment was 30 minutes.  Drue Second, MD Medical/Oncology [facility] [phone] (beeper) [phone]  (Office)

## 2012-12-23 ENCOUNTER — Encounter: Payer: Self-pay | Admitting: Adult Health

## 2012-12-23 ENCOUNTER — Ambulatory Visit (HOSPITAL_BASED_OUTPATIENT_CLINIC_OR_DEPARTMENT_OTHER): Payer: Private Health Insurance - Indemnity

## 2012-12-23 ENCOUNTER — Other Ambulatory Visit (HOSPITAL_BASED_OUTPATIENT_CLINIC_OR_DEPARTMENT_OTHER): Payer: Private Health Insurance - Indemnity | Admitting: Lab

## 2012-12-23 ENCOUNTER — Ambulatory Visit (HOSPITAL_BASED_OUTPATIENT_CLINIC_OR_DEPARTMENT_OTHER): Payer: Private Health Insurance - Indemnity | Admitting: Adult Health

## 2012-12-23 VITALS — BP 128/76 | HR 104 | Temp 98.2°F | Resp 20 | Ht 65.0 in | Wt 137.3 lb

## 2012-12-23 DIAGNOSIS — C50319 Malignant neoplasm of lower-inner quadrant of unspecified female breast: Secondary | ICD-10-CM

## 2012-12-23 DIAGNOSIS — Z5111 Encounter for antineoplastic chemotherapy: Secondary | ICD-10-CM

## 2012-12-23 DIAGNOSIS — C50311 Malignant neoplasm of lower-inner quadrant of right female breast: Secondary | ICD-10-CM

## 2012-12-23 DIAGNOSIS — Z170 Estrogen receptor positive status [ER+]: Secondary | ICD-10-CM

## 2012-12-23 LAB — COMPREHENSIVE METABOLIC PANEL (CC13)
ALT: 61 U/L — ABNORMAL HIGH (ref 0–55)
AST: 53 U/L — ABNORMAL HIGH (ref 5–34)
Albumin: 3.4 g/dL — ABNORMAL LOW (ref 3.5–5.0)
Alkaline Phosphatase: 144 U/L (ref 40–150)
BUN: 11.6 mg/dL (ref 7.0–26.0)
CO2: 27 mEq/L (ref 22–29)
Calcium: 9.1 mg/dL (ref 8.4–10.4)
Chloride: 106 mEq/L (ref 98–107)
Creatinine: 0.6 mg/dL (ref 0.6–1.1)
Glucose: 101 mg/dl — ABNORMAL HIGH (ref 70–99)
Potassium: 4.1 mEq/L (ref 3.5–5.1)
Sodium: 141 mEq/L (ref 136–145)
Total Bilirubin: 0.29 mg/dL (ref 0.20–1.20)
Total Protein: 6.5 g/dL (ref 6.4–8.3)

## 2012-12-23 LAB — CBC WITH DIFFERENTIAL/PLATELET
BASO%: 0.8 % (ref 0.0–2.0)
Basophils Absolute: 0 10*3/uL (ref 0.0–0.1)
EOS%: 0 % (ref 0.0–7.0)
Eosinophils Absolute: 0 10*3/uL (ref 0.0–0.5)
HCT: 31 % — ABNORMAL LOW (ref 34.8–46.6)
HGB: 10.3 g/dL — ABNORMAL LOW (ref 11.6–15.9)
LYMPH%: 19.3 % (ref 14.0–49.7)
MCH: 27.7 pg (ref 25.1–34.0)
MCHC: 33.2 g/dL (ref 31.5–36.0)
MCV: 83.3 fL (ref 79.5–101.0)
MONO#: 0.4 10*3/uL (ref 0.1–0.9)
MONO%: 9.9 % (ref 0.0–14.0)
NEUT#: 2.5 10*3/uL (ref 1.5–6.5)
NEUT%: 70 % (ref 38.4–76.8)
Platelets: 321 10*3/uL (ref 145–400)
RBC: 3.72 10*6/uL (ref 3.70–5.45)
RDW: 24.3 % — ABNORMAL HIGH (ref 11.2–14.5)
WBC: 3.6 10*3/uL — ABNORMAL LOW (ref 3.9–10.3)
lymph#: 0.7 10*3/uL — ABNORMAL LOW (ref 0.9–3.3)
nRBC: 0 % (ref 0–0)

## 2012-12-23 MED ORDER — HEPARIN SOD (PORK) LOCK FLUSH 100 UNIT/ML IV SOLN
500.0000 [IU] | Freq: Once | INTRAVENOUS | Status: AC | PRN
  Administered 2012-12-23: 500 [IU]
  Filled 2012-12-23: qty 5

## 2012-12-23 MED ORDER — DEXAMETHASONE SODIUM PHOSPHATE 20 MG/5ML IJ SOLN
20.0000 mg | Freq: Once | INTRAMUSCULAR | Status: AC
  Administered 2012-12-23: 20 mg via INTRAVENOUS

## 2012-12-23 MED ORDER — SODIUM CHLORIDE 0.9 % IV SOLN
Freq: Once | INTRAVENOUS | Status: AC
  Administered 2012-12-23: 13:00:00 via INTRAVENOUS

## 2012-12-23 MED ORDER — SODIUM CHLORIDE 0.9 % IV SOLN
80.0000 mg/m2 | Freq: Once | INTRAVENOUS | Status: AC
  Administered 2012-12-23: 132 mg via INTRAVENOUS
  Filled 2012-12-23: qty 22

## 2012-12-23 MED ORDER — DIPHENHYDRAMINE HCL 50 MG/ML IJ SOLN
50.0000 mg | Freq: Once | INTRAMUSCULAR | Status: AC
  Administered 2012-12-23: 50 mg via INTRAVENOUS

## 2012-12-23 MED ORDER — SODIUM CHLORIDE 0.9 % IV SOLN
288.2000 mg | Freq: Once | INTRAVENOUS | Status: AC
  Administered 2012-12-23: 290 mg via INTRAVENOUS
  Filled 2012-12-23: qty 29

## 2012-12-23 MED ORDER — ONDANSETRON 16 MG/50ML IVPB (CHCC)
16.0000 mg | Freq: Once | INTRAVENOUS | Status: AC
  Administered 2012-12-23: 16 mg via INTRAVENOUS

## 2012-12-23 MED ORDER — SODIUM CHLORIDE 0.9 % IJ SOLN
10.0000 mL | INTRAMUSCULAR | Status: DC | PRN
  Administered 2012-12-23: 10 mL
  Filled 2012-12-23: qty 10

## 2012-12-23 MED ORDER — FAMOTIDINE IN NACL 20-0.9 MG/50ML-% IV SOLN
20.0000 mg | Freq: Once | INTRAVENOUS | Status: AC
  Administered 2012-12-23: 20 mg via INTRAVENOUS

## 2012-12-23 NOTE — Progress Notes (Signed)
Discharged with son at 67, ambulatory in no distress.

## 2012-12-23 NOTE — Progress Notes (Signed)
OFFICE PROGRESS NOTE  CC  Nancy Mew, MD [address] [place] [address] Dr. Lurline Hare  Dr. Avel Peace  DIAGNOSIS: 57 year old female with new diagnosis of stage II a breast cancer that is triple negative    STAGE:  Cancer of lower-inner quadrant of female breast  Primary site: Breast (Right)  Staging method: AJCC 7th Edition  Clinical: Stage IIA (T2, N0, cM0)  Summary: Stage IA (T2, N0, cM0)  PRIOR THERAPY: #1Patient's last mammogram was about 5 years ago. Recently she underwent a screening mammogram that showed a spiculated mass in the lower inner quadrant of the right breast. She had ultrasound performed that showed irregular mass at the 5:00 position 3 cm from the nipple measuring 1.1 cm. She had a needle core biopsy performed on 06/19/2012 the biopsy showed invasive ductal carcinoma with ductal carcinoma in situ grade 3 tumor was ER negative PR negative HER-2/neu negative. Ki-67 was elevated at 34%. She went on to have MRI of the breasts performed on 06/24/2012 the MRI showed in the middle third of the lower inner quadrant of the right breast a 1.5 x 1.3 x 1.9 cm irregular enhancing mass. No abnormal enhancement was seen in the left breast no enlarged axillary or internal mammary adenopathy was detected.   #2 patient is now status post bilateral mastectomies. On her right mastectomy she was found to have a 2.3 cm invasive ductal carcinoma 01 lymph nodes were positive for metastatic disease and this is T2 N0) stage II. Tumor was grade 3 ER 2% PR negative HER-2/neu negative with a Ki-67 at 34%. Left mastectomy only showed intraductal papilloma node atypia or malignancy was found.  #3 patient is now being considered for adjuvant chemotherapy since her tumor is essentially triple negative and is a stage II. We discussed the rationale for adjuvant chemotherapy. She will receive Adriamycin Cytoxan every 2 weeks for a total of 4 cycles with G-CSF support. Once  she completes this then she will receive Taxol and carboplatinum every week for a total of 12 weeks. I did discuss with her use of antiestrogen therapy such as an aromatase inhibitor since her tumor is 2% ER positive. I discussed the rationale for this.  CURRENT THERAPY: Taxol Carbo week 2  INTERVAL HISTORY: Nancy Beard 57 y.o. female returns for followup of her right breast cancer.  She is doing well today.  She received her first cycle of taxol/carbo last week.  She had mild peeling of her thumb and 4th digit of right hand that improved with a good moisturizer.  She denies fevers, chills, nausea, vomiting, diarrhea, numbness, diarrhea, or any further concerns.  She had constipation relieved with a stool softener.    MEDICAL HISTORY: Past Medical History  Diagnosis Date  . Thyroid disease   . Depression   . Sprue   . Diverticulosis   . Hyperlipidemia   . Breast cancer     right  . Hypothyroidism   . Pneumonia     ALLERGIES:  is allergic to ciprofloxacin and gluten meal.  MEDICATIONS:  Current Outpatient Prescriptions  Medication Sig Dispense Refill  . acetaminophen (TYLENOL) 325 MG tablet Take 650 mg by mouth as needed.      . B Complex-C (SUPER B COMPLEX PO) Take 1 tablet by mouth daily.      [name] dexamethasone (DECADRON) 4 MG tablet Take 2 tablets (8 mg total) by mouth 2 (two) times daily with a meal. Take two times a day starting the day  after chemotherapy for 3 days.  30 tablet  1  . levothyroxine (SYNTHROID, LEVOTHROID) 25 MCG tablet Take 25 mcg by mouth daily.      [name] lidocaine-prilocaine (EMLA) cream Apply topically as needed.  30 g  8  . liothyronine (CYTOMEL) 25 MCG tablet Take 12.5 mcg by mouth daily.      [name] LORazepam (ATIVAN) 0.5 MG tablet Take 1 tablet (0.5 mg total) by mouth every 6 (six) hours as needed (Nausea or vomiting).  30 tablet  0  . Multiple Vitamin (MULTIVITAMIN) tablet Take 1 tablet by mouth daily.      [name] omeprazole (PRILOSEC) 20 MG capsule Take 1 capsule  (20 mg total) by mouth daily.  30 capsule  6  . ondansetron (ZOFRAN) 8 MG tablet Take 1 tablet (8 mg total) by mouth 2 (two) times daily. Take two times a day starting the day after chemo for 3 days. Then take two times a day as needed for nausea or vomiting.  30 tablet  1  . prochlorperazine (COMPAZINE) 10 MG tablet Take 1 tablet (10 mg total) by mouth every 6 (six) hours as needed (Nausea or vomiting).  30 tablet  1  . prochlorperazine (COMPAZINE) 25 MG suppository Place 1 suppository (25 mg total) rectally every 12 (twelve) hours as needed for nausea.  12 suppository  3  . valACYclovir (VALTREX) 500 MG tablet Take 1 tablet (500 mg total) by mouth 2 (two) times daily.  60 tablet  6  . venlafaxine XR (EFFEXOR-XR) 75 MG 24 hr capsule Take 1 capsule (75 mg total) by mouth daily.  90 capsule  3  . HYDROcodone-acetaminophen (NORCO/VICODIN) 5-325 MG per tablet Take 1 tablet by mouth every 6 (six) hours as needed.  30 tablet  0  . levofloxacin (LEVAQUIN) 500 MG tablet Take 1 tablet (500 mg total) by mouth daily.  7 tablet  2  . Sulfamethoxazole-Trimethoprim (BACTRIM PO) Take by mouth 2 (two) times daily.       No current facility-administered medications for this visit.    SURGICAL HISTORY:  Past Surgical History  Procedure Laterality Date  . Esophagogastroduodenoscopy  2007    sprue  . Colonoscopy  2006  . Cryoblation of cervix    . Breast lumpectomy    . Total mastectomy Bilateral 09/11/2012    Procedure: bilateral MASTECTOMY;  Surgeon: Adolph Pollack, MD;  Location: [facility] OR;  Service: General;  Laterality: Bilateral;  . Axillary sentinel node biopsy Right 09/11/2012    Procedure: AXILLARY SENTINEL lymph NODE  BIOPSY;  Surgeon: Adolph Pollack, MD;  Location: [facility] OR;  Service: General;  Laterality: Right;  right nuclear medicine injection 12:30   . Breast reconstruction with placement of tissue expander and flex hd (acellular hydrated dermis) Bilateral 09/11/2012    Procedure: BREAST  RECONSTRUCTION WITH PLACEMENT OF TISSUE EXPANDER AND FLEX HD (ACELLULAR HYDRATED DERMIS) ADM;  Surgeon: Wayland Denis, DO;  Location: [facility] OR;  Service: Plastics;  Laterality: Bilateral;  . Incision and drainage of wound Left 09/16/2012    Procedure: Left Breast Evacuation of Hematoma;  Surgeon: Wayland Denis, DO;  Location: [facility] OR;  Service: Plastics;  Laterality: Left;  . Portacath placement Right 10/09/2012    Procedure: US GUIDED INSERTION PORT-A-CATH;  Surgeon: Adolph Pollack, MD;  Location: [facility];  Service: General;  Laterality: Right;  Right Subclavian Vein    REVIEW OF SYSTEMS:   General: fatigue (+), night sweats (-), fever (-), pain (-) Lymph: palpable nodes (-)  HEENT: vision changes (-), mucositis (-), gum bleeding (-), epistaxis (-) Cardiovascular: chest pain (-), palpitations (-) Pulmonary: shortness of breath (-), dyspnea on exertion (-), cough (-), hemoptysis (-) GI:  Early satiety (-), melena (-), dysphagia (-), nausea/vomiting (-), diarrhea (-) GU: dysuria (-), hematuria (-), incontinence (-) Musculoskeletal: joint swelling (-), joint pain (-), back pain (-) Neuro: weakness (-), numbness (-), headache (-), confusion (-) Skin: Rash (-), lesions (-), dryness (-) Psych: depression (-), suicidal/homicidal ideation (-), feeling of hopelessness (-)   PHYSICAL EXAMINATION: Blood pressure 128/76, pulse 104, temperature 98.2 F (36.8 C), temperature source Oral, resp. rate 20, height 5\' 5"  (1.651 m), weight 137 lb 4.8 oz (62.279 kg), last menstrual period 09/11/2012. Body mass index is 22.85 kg/(m^2). General: Patient is a well appearing female in no acute distress HEENT: PERRLA, sclerae anicteric no conjunctival pallor, MMM Neck: supple, no palpable adenopathy Lungs: clear to auscultation bilaterally, no wheezes, rhonchi, or rales Cardiovascular: regular rate rhythm, S1, S2, no murmurs, rubs or gallops, HR 96 recheck Abdomen: Soft, non-tender, non-distended,  normoactive bowel sounds, no HSM Extremities: warm and well perfused, no clubbing, cyanosis, or edema Skin: No rashes or lesions, scalp erythema appears to be a birthmark Neuro: Non-focal Breasts: bilateral mastectomy sites are well healed, no nodularity, masses, or skin changes, expanders in place. ECOG PERFORMANCE STATUS: 1 - Symptomatic but completely ambulatory  LABORATORY DATA: Lab Results  Component Value Date   WBC 3.6* 12/23/2012   HGB 10.3* 12/23/2012   HCT 31.0* 12/23/2012   MCV 83.3 12/23/2012   PLT 321 12/23/2012      Chemistry      Component Value Date/Time   NA 139 12/16/2012 1223   NA 138 09/15/2012 2035   K 4.1 12/16/2012 1223   K 3.6 09/15/2012 2035   CL 104 12/16/2012 1223   CL 103 09/15/2012 2035   CO2 26 12/16/2012 1223   CO2 26 09/15/2012 2035   BUN 9.7 12/16/2012 1223   BUN 4* 09/15/2012 2035   CREATININE 0.5* 12/16/2012 1223   CREATININE 0.54 09/15/2012 2035      Component Value Date/Time   CALCIUM 9.1 12/16/2012 1223   CALCIUM 9.0 09/15/2012 2035   ALKPHOS 185* 12/16/2012 1223   ALKPHOS 142* 09/15/2012 2035   AST 43* 12/16/2012 1223   AST 32 09/15/2012 2035   ALT 45 12/16/2012 1223   ALT 24 09/15/2012 2035   BILITOT 0.25 12/16/2012 1223   BILITOT 0.8 09/15/2012 2035     ADDITIONAL INFORMATION: 3. CHROMOGENIC IN-SITU HYBRIDIZATION Interpretation HER-2/NEU BY CISH - NO AMPLIFICATION OF HER-2 DETECTED. THE RATIO OF HER-2: CEP 17 SIGNALS WAS 1.52. Reference range: Ratio: HER2:CEP17 < 1.8 - gene amplification not observed Ratio: HER2:CEP 17 1.8-2.2 - equivocal result Ratio: HER2:CEP17 > 2.2 - gene amplification observed Pecola Leisure MD Pathologist, Electronic Signature ( Signed 09/22/2012) 3. PROGNOSTIC INDICATORS - ACIS Results IMMUNOHISTOCHEMICAL AND MORPHOMETRIC ANALYSIS BY THE AUTOMATED CELLULAR IMAGING SYSTEM (ACIS) Estrogen Receptor (Negative, <1%): 2%, POSITIVE, WEAK STAINING INTENSITY Progesterone Receptor (Negative, <1%): 0%, NEGATIVE COMMENT: The negative hormone receptor  study in this case has an internal positive control. All controls stained appropriately Pecola Leisure MD Pathologist, Electronic Signature ( Signed 09/22/2012) 1 of 4 FINAL for Veldman, Renika A [phone] FINAL DIAGNOSIS Diagnosis 1. Lymph node, sentinel, biopsy, Right axilla - ONE LYMPH NODE, NEGATIVE FOR TUMOR (0/1). 2. Breast, simple mastectomy, Left - BENIGN BREAST TISSUE WITH INTRADUCTAL PAPILLOMA, SEE COMMENT. - NEGATIVE FOR ATYPIA OR MALIGNANCY. - SURGICAL MARGINS, NEGATIVE FOR ATYPIA  OR MALIGNANCY. - MICROCALCIFICATIONS IDENTIFIED. 3. Breast, simple mastectomy, Right - INVASIVE DUCTAL CARCINOMA, GRADE III, WITH SPINDLE CELL DIFFERENTIATION (METAPLASTIC CARCINOMA) (2.3CM), SEE COMMENT. - NO LYMPHOVASCULAR INVASION IDENTIFIED. - INVASIVE TUMOR IS 1.5 CM FROM NEAREST MARGIN (DEEP). - DUCTAL CARCINOMA IN SITU, GRADE III, WITH COMEDONECROSIS. - SEE TUMOR SYNOPTIC TEMPLATE BELOW. Microscopic Comment 2. Although there was no mass grossly identified, there are definitive morphologic features of intraductal papilloma with associated fibrocystic change present. In addition, there are fibrocystic changes with usual ductal hyperplasia in representative sections not involved by papilloma. Finally, foci of sclerosing adenosis and microcalcifications in benign ducts and lobules are present. The surgical resection margin(s) of the specimen were inked and microscopically evaluated. 3. BREAST, INVASIVE TUMOR, WITH LYMPH NODE SAMPLING Specimen, including laterality: Right breast. Procedure: Simple mastectomy. Grade: III of III. Tubule formation: 3. Nuclear pleomorphism: 3. Mitotic: 2. Tumor size (gross measurement): 2.3 cm. Margins: Invasive, distance to closest margin: 1.5 cm. In-situ, distance to closest margin: 1.5 cm (deep). If margin positive, focally or broadly: N/A. Lymphovascular invasion: Absent. Ductal carcinoma in situ: Present. Grade: III of III. Extensive intraductal  component: Absent. Lobular neoplasia: Absent. Tumor focality: Unifocal. Treatment effect: None. If present, treatment effect in breast tissue, lymph nodes or both: N/A. Extent of tumor: Skin and Nipple: Grossly negative. Skeletal muscle: N/A. Lymph nodes: # examined: 1. Lymph nodes with metastasis: 0. Breast prognostic profile: Estrogen receptor: Repeated; previous study demonstrates 0% positivity ([id number]). Progesterone receptor: Repeated; previous study demonstrates 0% positivity ([id number]). HER-2/neu: Repeated; previous study demonstrated no amplification (1.55) ([id number]). 2 of 4 FINAL for TONISHA, SILVEY A [phone]) Microscopic Comment(continued) Ki-67: Not repeated; previous study demonstrates 34% proliferation rate [phone]). Non-neoplastic breast: Intraductal papilloma with fibrocystic change and usual ductal hyperplasia, fibrocystic change with usual ductal hyperplasia, microcalcifications, and previous biopsy site. TNM: pT2, pN0, pMX. Comments: Although there are definitive features of high grade invasive ductal carcinoma identified, there are multiple foci where the carcinoma assumes a spindle cell morphology / spindle cell differentiation. 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 There are no heterologous elements identified. (CRR:eps 09/15/12)  RADIOGRAPHIC STUDIES:  Chest 2 View  09/08/2012  *RADIOLOGY REPORT*  Clinical Data: Right-sided breast  CHEST - 2 VIEW  Comparison: Chest x-ray 12/06/2009.  Findings: Lung volumes are normal.  No consolidative airspace disease.  No pleural effusions.  No pneumothorax.  No pulmonary nodule or mass noted.  Pulmonary vasculature and the cardiomediastinal silhouette are within normal limits.  Mild bilateral apical pleuroparenchymal thickening most compatible with chronic scarring, unchanged compared to the prior examination.  IMPRESSION: 1. No  radiographic evidence of acute cardiopulmonary disease.   Original Report Authenticated By: Trudie Reed, M.D.    Nm Sentinel Node Inj-no Rpt (breast)  09/11/2012  CLINICAL DATA: Invasive right breast cancer   Sulfur colloid was injected intradermally by the nuclear medicine  technologist for breast cancer sentinel node localization.      ASSESSMENT: 57 year old female with  #1 invasive ductal carcinoma of the right breast status post bilateral mastectomies for a 2.3 cm node negative essentially triple negative disease on the right. Without any evidence of malignancy on the left. Patient's a candidate for adjuvant chemotherapy. We discussed Adriamycin Cytoxan dose dense followed by weekly Taxol Carbo. Since her tumor was 2% estrogen receptor positive we will utilize an aromatase inhibitor for 5 years. She understands the rationale for this.  #2 patient is also having considerable anxiety and will  continue to take Effexor daily.    #4 she also began Prilosec 20 mg daily for reflux.  PLAN:   #1 Doing well. Ms. Rosiles labs are stable.  She will proceed with chemotherapy.  She will continue to take Super B complex daily.    #2 She will return in one week's time for labs and an appointment for evaluation, and chemotherapy.    All questions were answered. The patient knows to call the clinic with any problems, questions or concerns. We can certainly see the patient much sooner if necessary.  I spent 25 minutes counseling the patient face to face. The total time spent in the appointment was 30 minutes.  Cherie Ouch Lyn Hollingshead, NP Medical Oncology [facility] Phone: [phone] 12/23/2012, 11:34 AM

## 2012-12-23 NOTE — Progress Notes (Signed)
Carboplatin was clamped per Olegario Messier B.  She opened clamp and Carboplatin infused from 1532 to 1602.

## 2012-12-23 NOTE — Patient Instructions (Addendum)
[facility] Discharge Instructions for Patients Receiving Chemotherapy  Today you received the following chemotherapy agents carboplatin, taxol.  To help prevent nausea and vomiting after your treatment, we encourage you to take your nausea medication zofran 8 mg, compazine 10 mg If you develop nausea and vomiting that is not controlled by your nausea medication, call the clinic.   BELOW ARE SYMPTOMS THAT SHOULD BE REPORTED IMMEDIATELY:  *FEVER GREATER THAN 100.5 F  *CHILLS WITH OR WITHOUT FEVER  NAUSEA AND VOMITING THAT IS NOT CONTROLLED WITH YOUR NAUSEA MEDICATION  *UNUSUAL SHORTNESS OF BREATH  *UNUSUAL BRUISING OR BLEEDING  TENDERNESS IN MOUTH AND THROAT WITH OR WITHOUT PRESENCE OF ULCERS  *URINARY PROBLEMS  *BOWEL PROBLEMS  UNUSUAL RASH Items with * indicate a potential emergency and should be followed up as soon as possible.  Feel free to call the clinic you have any questions or concerns. The clinic phone number is [phone].

## 2012-12-23 NOTE — Patient Instructions (Signed)
Doing well.  Proceed with chemotherapy.   

## 2012-12-30 ENCOUNTER — Other Ambulatory Visit (HOSPITAL_BASED_OUTPATIENT_CLINIC_OR_DEPARTMENT_OTHER): Payer: Private Health Insurance - Indemnity | Admitting: Lab

## 2012-12-30 ENCOUNTER — Ambulatory Visit (HOSPITAL_BASED_OUTPATIENT_CLINIC_OR_DEPARTMENT_OTHER): Payer: Private Health Insurance - Indemnity | Admitting: Adult Health

## 2012-12-30 ENCOUNTER — Encounter: Payer: Self-pay | Admitting: Adult Health

## 2012-12-30 ENCOUNTER — Ambulatory Visit (HOSPITAL_BASED_OUTPATIENT_CLINIC_OR_DEPARTMENT_OTHER): Payer: Private Health Insurance - Indemnity

## 2012-12-30 VITALS — BP 133/85 | HR 109 | Temp 98.1°F | Resp 20 | Ht 65.0 in | Wt 138.4 lb

## 2012-12-30 DIAGNOSIS — Z5111 Encounter for antineoplastic chemotherapy: Secondary | ICD-10-CM

## 2012-12-30 DIAGNOSIS — C50319 Malignant neoplasm of lower-inner quadrant of unspecified female breast: Secondary | ICD-10-CM

## 2012-12-30 DIAGNOSIS — F411 Generalized anxiety disorder: Secondary | ICD-10-CM

## 2012-12-30 DIAGNOSIS — K219 Gastro-esophageal reflux disease without esophagitis: Secondary | ICD-10-CM

## 2012-12-30 DIAGNOSIS — C50311 Malignant neoplasm of lower-inner quadrant of right female breast: Secondary | ICD-10-CM

## 2012-12-30 DIAGNOSIS — Z170 Estrogen receptor positive status [ER+]: Secondary | ICD-10-CM

## 2012-12-30 LAB — COMPREHENSIVE METABOLIC PANEL (CC13)
ALT: 63 U/L — ABNORMAL HIGH (ref 0–55)
AST: 41 U/L — ABNORMAL HIGH (ref 5–34)
Albumin: 3.5 g/dL (ref 3.5–5.0)
Alkaline Phosphatase: 136 U/L (ref 40–150)
BUN: 7.2 mg/dL (ref 7.0–26.0)
CO2: 24 mEq/L (ref 22–29)
Calcium: 9.4 mg/dL (ref 8.4–10.4)
Chloride: 107 mEq/L (ref 98–107)
Creatinine: 0.6 mg/dL (ref 0.6–1.1)
Glucose: 109 mg/dl — ABNORMAL HIGH (ref 70–99)
Potassium: 4 mEq/L (ref 3.5–5.1)
Sodium: 139 mEq/L (ref 136–145)
Total Bilirubin: 0.39 mg/dL (ref 0.20–1.20)
Total Protein: 6.6 g/dL (ref 6.4–8.3)

## 2012-12-30 LAB — CBC WITH DIFFERENTIAL/PLATELET
BASO%: 0.3 % (ref 0.0–2.0)
Basophils Absolute: 0 10*3/uL (ref 0.0–0.1)
EOS%: 2.3 % (ref 0.0–7.0)
Eosinophils Absolute: 0.1 10*3/uL (ref 0.0–0.5)
HCT: 32.2 % — ABNORMAL LOW (ref 34.8–46.6)
HGB: 10.7 g/dL — ABNORMAL LOW (ref 11.6–15.9)
LYMPH%: 22.8 % (ref 14.0–49.7)
MCH: 28 pg (ref 25.1–34.0)
MCHC: 33.2 g/dL (ref 31.5–36.0)
MCV: 84.3 fL (ref 79.5–101.0)
MONO#: 0.2 10*3/uL (ref 0.1–0.9)
MONO%: 7.8 % (ref 0.0–14.0)
NEUT#: 2.1 10*3/uL (ref 1.5–6.5)
NEUT%: 66.8 % (ref 38.4–76.8)
Platelets: 207 10*3/uL (ref 145–400)
RBC: 3.82 10*6/uL (ref 3.70–5.45)
RDW: 25.3 % — ABNORMAL HIGH (ref 11.2–14.5)
WBC: 3.1 10*3/uL — ABNORMAL LOW (ref 3.9–10.3)
lymph#: 0.7 10*3/uL — ABNORMAL LOW (ref 0.9–3.3)
nRBC: 0 % (ref 0–0)

## 2012-12-30 MED ORDER — SODIUM CHLORIDE 0.9 % IV SOLN
250.0000 mg | Freq: Once | INTRAVENOUS | Status: AC
  Administered 2012-12-30: 250 mg via INTRAVENOUS
  Filled 2012-12-30: qty 25

## 2012-12-30 MED ORDER — SODIUM CHLORIDE 0.9 % IV SOLN
Freq: Once | INTRAVENOUS | Status: AC
  Administered 2012-12-30: 11:00:00 via INTRAVENOUS

## 2012-12-30 MED ORDER — SODIUM CHLORIDE 0.9 % IJ SOLN
10.0000 mL | INTRAMUSCULAR | Status: DC | PRN
  Administered 2012-12-30: 10 mL
  Filled 2012-12-30: qty 10

## 2012-12-30 MED ORDER — ONDANSETRON 16 MG/50ML IVPB (CHCC)
16.0000 mg | Freq: Once | INTRAVENOUS | Status: AC
  Administered 2012-12-30: 16 mg via INTRAVENOUS

## 2012-12-30 MED ORDER — DEXAMETHASONE SODIUM PHOSPHATE 20 MG/5ML IJ SOLN
20.0000 mg | Freq: Once | INTRAMUSCULAR | Status: AC
  Administered 2012-12-30: 20 mg via INTRAVENOUS

## 2012-12-30 MED ORDER — FAMOTIDINE IN NACL 20-0.9 MG/50ML-% IV SOLN
20.0000 mg | Freq: Once | INTRAVENOUS | Status: AC
  Administered 2012-12-30: 20 mg via INTRAVENOUS

## 2012-12-30 MED ORDER — DIPHENHYDRAMINE HCL 50 MG/ML IJ SOLN
50.0000 mg | Freq: Once | INTRAMUSCULAR | Status: AC
  Administered 2012-12-30: 50 mg via INTRAVENOUS

## 2012-12-30 MED ORDER — HEPARIN SOD (PORK) LOCK FLUSH 100 UNIT/ML IV SOLN
500.0000 [IU] | Freq: Once | INTRAVENOUS | Status: AC | PRN
  Administered 2012-12-30: 500 [IU]
  Filled 2012-12-30: qty 5

## 2012-12-30 MED ORDER — SODIUM CHLORIDE 0.9 % IV SOLN
80.0000 mg/m2 | Freq: Once | INTRAVENOUS | Status: AC
  Administered 2012-12-30: 132 mg via INTRAVENOUS
  Filled 2012-12-30: qty 22

## 2012-12-30 MED ORDER — PACLITAXEL CHEMO INJECTION 300 MG/50ML: 80.0000 mg/m2 | Freq: Once | INTRAVENOUS | Status: DC

## 2012-12-30 NOTE — Patient Instructions (Signed)
[facility] Discharge Instructions for Patients Receiving Chemotherapy  Today you received the following chemotherapy agents :  Taxol, Carboplatin.  To help prevent nausea and vomiting after your treatment, we encourage you to take your nausea medication as instructed by your physician.   If you develop nausea and vomiting that is not controlled by your nausea medication, call the clinic.   BELOW ARE SYMPTOMS THAT SHOULD BE REPORTED IMMEDIATELY:  *FEVER GREATER THAN 100.5 F  *CHILLS WITH OR WITHOUT FEVER  NAUSEA AND VOMITING THAT IS NOT CONTROLLED WITH YOUR NAUSEA MEDICATION  *UNUSUAL SHORTNESS OF BREATH  *UNUSUAL BRUISING OR BLEEDING  TENDERNESS IN MOUTH AND THROAT WITH OR WITHOUT PRESENCE OF ULCERS  *URINARY PROBLEMS  *BOWEL PROBLEMS  UNUSUAL RASH Items with * indicate a potential emergency and should be followed up as soon as possible.  Feel free to call the clinic you have any questions or concerns. The clinic phone number is [phone].    

## 2012-12-30 NOTE — Progress Notes (Signed)
OFFICE PROGRESS NOTE  CC  Nancy Mew, MD [address] [place] [address] Dr. Lurline Hare  Dr. Avel Peace  DIAGNOSIS: 57 year old female with new diagnosis of stage II Beard breast cancer that is triple negative    STAGE:  Cancer of lower-inner quadrant of female breast  Primary site: Breast (Right)  Staging method: AJCC 7th Edition  Clinical: Stage IIA (T2, N0, cM0)  Summary: Stage IA (T2, N0, cM0)  PRIOR THERAPY: #1Patient's last mammogram was about 5 years ago. Recently she underwent Beard screening mammogram that showed Beard spiculated mass in the lower inner quadrant of the right breast. She had ultrasound performed that showed irregular mass at the 5:00 position 3 cm from the nipple measuring 1.1 cm. She had Beard needle core biopsy performed on 06/19/2012 the biopsy showed invasive ductal carcinoma with ductal carcinoma in situ grade 3 tumor was ER negative PR negative HER-2/neu negative. Ki-67 was elevated at 34%. She went on to have MRI of the breasts performed on 06/24/2012 the MRI showed in the middle third of the lower inner quadrant of the right breast Beard 1.5 x 1.3 x 1.9 cm irregular enhancing mass. No abnormal enhancement was seen in the left breast no enlarged axillary or internal mammary adenopathy was detected.   #2 patient is now status post bilateral mastectomies. On her right mastectomy she was found to have Beard 2.3 cm invasive ductal carcinoma 01 lymph nodes were positive for metastatic disease and this is T2 N0) stage II. Tumor was grade 3 ER 2% PR negative HER-2/neu negative with Beard Ki-67 at 34%. Left mastectomy only showed intraductal papilloma node atypia or malignancy was found.  #3 patient is now being considered for adjuvant chemotherapy since her tumor is essentially triple negative and is Beard stage II. We discussed the rationale for adjuvant chemotherapy. She will receive Adriamycin Cytoxan every 2 weeks for Beard total of 4 cycles with G-CSF support. Once  she completes this then she will receive Taxol and carboplatinum every week for Beard total of 12 weeks. I did discuss with her use of antiestrogen therapy such as an aromatase inhibitor since her tumor is 2% ER positive. I discussed the rationale for this.  CURRENT THERAPY: Taxol Carbo week 3  INTERVAL HISTORY: Nancy Beard 57 y.o. female returns for followup of her right breast cancer.  She is doing well today. She had some skin peeling in her right great toe that resolved after good moisturizing and wearing socks.  She denies fevers, chills, nausea, vomiting, constipation, diarrhea, numbness or any other concerns.    MEDICAL HISTORY: Past Medical History  Diagnosis Date  . Thyroid disease   . Depression   . Sprue   . Diverticulosis   . Hyperlipidemia   . Breast cancer     right  . Hypothyroidism   . Pneumonia     ALLERGIES:  is allergic to ciprofloxacin and gluten meal.  MEDICATIONS:  Current Outpatient Prescriptions  Medication Sig Dispense Refill  . acetaminophen (TYLENOL) 325 MG tablet Take 650 mg by mouth as needed.      . B Complex-C (SUPER B COMPLEX PO) Take 1 tablet by mouth daily.      [name] dexamethasone (DECADRON) 4 MG tablet Take 2 tablets (8 mg total) by mouth 2 (two) times daily with Beard meal. Take two times Beard day starting the day after chemotherapy for 3 days.  30 tablet  1  . HYDROcodone-acetaminophen (NORCO/VICODIN) 5-325 MG per tablet Take 1 tablet  by mouth every 6 (six) hours as needed.  30 tablet  0  . levothyroxine (SYNTHROID, LEVOTHROID) 25 MCG tablet Take 25 mcg by mouth daily.      [name] lidocaine-prilocaine (EMLA) cream Apply topically as needed.  30 g  8  . liothyronine (CYTOMEL) 25 MCG tablet Take 12.5 mcg by mouth daily.      [name] LORazepam (ATIVAN) 0.5 MG tablet Take 1 tablet (0.5 mg total) by mouth every 6 (six) hours as needed (Nausea or vomiting).  30 tablet  0  . Multiple Vitamin (MULTIVITAMIN) tablet Take 1 tablet by mouth daily.      . ondansetron (ZOFRAN)  8 MG tablet Take 1 tablet (8 mg total) by mouth 2 (two) times daily. Take two times Beard day starting the day after chemo for 3 days. Then take two times Beard day as needed for nausea or vomiting.  30 tablet  1  . prochlorperazine (COMPAZINE) 10 MG tablet Take 1 tablet (10 mg total) by mouth every 6 (six) hours as needed (Nausea or vomiting).  30 tablet  1  . prochlorperazine (COMPAZINE) 25 MG suppository Place 1 suppository (25 mg total) rectally every 12 (twelve) hours as needed for nausea.  12 suppository  3  . valACYclovir (VALTREX) 500 MG tablet Take 1 tablet (500 mg total) by mouth 2 (two) times daily.  60 tablet  6  . venlafaxine XR (EFFEXOR-XR) 75 MG 24 hr capsule Take 1 capsule (75 mg total) by mouth daily.  90 capsule  3   No current facility-administered medications for this visit.    SURGICAL HISTORY:  Past Surgical History  Procedure Laterality Date  . Esophagogastroduodenoscopy  2007    sprue  . Colonoscopy  2006  . Cryoblation of cervix    . Breast lumpectomy    . Total mastectomy Bilateral 09/11/2012    Procedure: bilateral MASTECTOMY;  Surgeon: Adolph Pollack, MD;  Location: [facility] OR;  Service: General;  Laterality: Bilateral;  . Axillary sentinel node biopsy Right 09/11/2012    Procedure: AXILLARY SENTINEL lymph NODE  BIOPSY;  Surgeon: Adolph Pollack, MD;  Location: [facility] OR;  Service: General;  Laterality: Right;  right nuclear medicine injection 12:30   . Breast reconstruction with placement of tissue expander and flex hd (acellular hydrated dermis) Bilateral 09/11/2012    Procedure: BREAST RECONSTRUCTION WITH PLACEMENT OF TISSUE EXPANDER AND FLEX HD (ACELLULAR HYDRATED DERMIS) ADM;  Surgeon: Wayland Denis, DO;  Location: [facility] OR;  Service: Plastics;  Laterality: Bilateral;  . Incision and drainage of wound Left 09/16/2012    Procedure: Left Breast Evacuation of Hematoma;  Surgeon: Wayland Denis, DO;  Location: [facility] OR;  Service: Plastics;  Laterality: Left;  . Portacath placement Right  10/09/2012    Procedure: US GUIDED INSERTION PORT-Beard-CATH;  Surgeon: Adolph Pollack, MD;  Location: [facility];  Service: General;  Laterality: Right;  Right Subclavian Vein    REVIEW OF SYSTEMS:   General: fatigue (+), night sweats (-), fever (-), pain (-) Lymph: palpable nodes (-) HEENT: vision changes (-), mucositis (-), gum bleeding (-), epistaxis (-) Cardiovascular: chest pain (-), palpitations (-) Pulmonary: shortness of breath (-), dyspnea on exertion (-), cough (-), hemoptysis (-) GI:  Early satiety (-), melena (-), dysphagia (-), nausea/vomiting (-), diarrhea (-) GU: dysuria (-), hematuria (-), incontinence (-) Musculoskeletal: joint swelling (-), joint pain (-), back pain (-) Neuro: weakness (-), numbness (-), headache (-), confusion (-) Skin: Rash (-), lesions (-), dryness (-) Psych: depression (-),  suicidal/homicidal ideation (-), feeling of hopelessness (-)   PHYSICAL EXAMINATION: Blood pressure 133/85, pulse 109, temperature 98.1 F (36.7 C), temperature source Oral, resp. rate 20, height 5\' 5"  (1.651 m), weight 138 lb 6.4 oz (62.778 kg), last menstrual period 09/11/2012. Body mass index is 23.03 kg/(m^2). General: Patient is Beard well appearing female in no acute distress HEENT: PERRLA, sclerae anicteric no conjunctival pallor, MMM Neck: supple, no palpable adenopathy Lungs: clear to auscultation bilaterally, no wheezes, rhonchi, or rales Cardiovascular: regular rate rhythm, S1, S2, no murmurs, rubs or gallops, HR 96 recheck Abdomen: Soft, non-tender, non-distended, normoactive bowel sounds, no HSM Extremities: warm and well perfused, no clubbing, cyanosis, or edema Skin: No rashes or lesions, scalp erythema appears to be Beard birthmark Neuro: Non-focal Breasts: bilateral mastectomy sites are well healed, no nodularity, masses, or skin changes, expanders in place. ECOG PERFORMANCE STATUS: 1 - Symptomatic but completely ambulatory  LABORATORY DATA: Lab  Results  Component Value Date   WBC 3.1* 12/30/2012   HGB 10.7* 12/30/2012   HCT 32.2* 12/30/2012   MCV 84.3 12/30/2012   PLT 207 12/30/2012      Chemistry      Component Value Date/Time   NA 141 12/23/2012 1107   NA 138 09/15/2012 2035   K 4.1 12/23/2012 1107   K 3.6 09/15/2012 2035   CL 106 12/23/2012 1107   CL 103 09/15/2012 2035   CO2 27 12/23/2012 1107   CO2 26 09/15/2012 2035   BUN 11.6 12/23/2012 1107   BUN 4* 09/15/2012 2035   CREATININE 0.6 12/23/2012 1107   CREATININE 0.54 09/15/2012 2035      Component Value Date/Time   CALCIUM 9.1 12/23/2012 1107   CALCIUM 9.0 09/15/2012 2035   ALKPHOS 144 12/23/2012 1107   ALKPHOS 142* 09/15/2012 2035   AST 53* 12/23/2012 1107   AST 32 09/15/2012 2035   ALT 61* 12/23/2012 1107   ALT 24 09/15/2012 2035   BILITOT 0.29 12/23/2012 1107   BILITOT 0.8 09/15/2012 2035     ADDITIONAL INFORMATION: 3. CHROMOGENIC IN-SITU HYBRIDIZATION Interpretation HER-2/NEU BY CISH - NO AMPLIFICATION OF HER-2 DETECTED. THE RATIO OF HER-2: CEP 17 SIGNALS WAS 1.52. Reference range: Ratio: HER2:CEP17 < 1.8 - gene amplification not observed Ratio: HER2:CEP 17 1.8-2.2 - equivocal result Ratio: HER2:CEP17 > 2.2 - gene amplification observed Pecola Leisure MD Pathologist, Electronic Signature ( Signed 09/22/2012) 3. PROGNOSTIC INDICATORS - ACIS Results IMMUNOHISTOCHEMICAL AND MORPHOMETRIC ANALYSIS BY THE AUTOMATED CELLULAR IMAGING SYSTEM (ACIS) Estrogen Receptor (Negative, <1%): 2%, POSITIVE, WEAK STAINING INTENSITY Progesterone Receptor (Negative, <1%): 0%, NEGATIVE COMMENT: The negative hormone receptor study in this case has an internal positive control. All controls stained appropriately Pecola Leisure MD Pathologist, Electronic Signature ( Signed 09/22/2012) 1 of 4 FINAL for Nancy Beard, Nancy Beard [phone]) FINAL DIAGNOSIS Diagnosis 1. Lymph node, sentinel, biopsy, Right axilla - ONE LYMPH NODE, NEGATIVE FOR TUMOR (0/1). 2. Breast, simple mastectomy, Left - BENIGN BREAST  TISSUE WITH INTRADUCTAL PAPILLOMA, SEE COMMENT. - NEGATIVE FOR ATYPIA OR MALIGNANCY. - SURGICAL MARGINS, NEGATIVE FOR ATYPIA OR MALIGNANCY. - MICROCALCIFICATIONS IDENTIFIED. 3. Breast, simple mastectomy, Right - INVASIVE DUCTAL CARCINOMA, GRADE III, WITH SPINDLE CELL DIFFERENTIATION (METAPLASTIC CARCINOMA) (2.3CM), SEE COMMENT. - NO LYMPHOVASCULAR INVASION IDENTIFIED. - INVASIVE TUMOR IS 1.5 CM FROM NEAREST MARGIN (DEEP). - DUCTAL CARCINOMA IN SITU, GRADE III, WITH COMEDONECROSIS. - SEE TUMOR SYNOPTIC TEMPLATE BELOW. Microscopic Comment 2. Although there was no mass grossly identified, there are definitive morphologic features of intraductal papilloma with associated fibrocystic change present. In  addition, there are fibrocystic changes with usual ductal hyperplasia in representative sections not involved by papilloma. Finally, foci of sclerosing adenosis and microcalcifications in benign ducts and lobules are present. The surgical resection margin(s) of the specimen were inked and microscopically evaluated. 3. BREAST, INVASIVE TUMOR, WITH LYMPH NODE SAMPLING Specimen, including laterality: Right breast. Procedure: Simple mastectomy. Grade: III of III. Tubule formation: 3. Nuclear pleomorphism: 3. Mitotic: 2. Tumor size (gross measurement): 2.3 cm. Margins: Invasive, distance to closest margin: 1.5 cm. In-situ, distance to closest margin: 1.5 cm (deep). If margin positive, focally or broadly: N/Beard. Lymphovascular invasion: Absent. Ductal carcinoma in situ: Present. Grade: III of III. Extensive intraductal component: Absent. Lobular neoplasia: Absent. Tumor focality: Unifocal. Treatment effect: None. If present, treatment effect in breast tissue, lymph nodes or both: N/Beard. Extent of tumor: Skin and Nipple: Grossly negative. Skeletal muscle: N/Beard. Lymph nodes: # examined: 1. Lymph nodes with metastasis: 0. Breast prognostic profile: Estrogen receptor: Repeated; previous study  demonstrates 0% positivity ([id number]). Progesterone receptor: Repeated; previous study demonstrates 0% positivity ([id number]). HER-2/neu: Repeated; previous study demonstrated no amplification (1.55) ([id number]). 2 of 4 FINAL for Nancy Beard, Nancy Beard [phone]) Microscopic Comment(continued) Ki-67: Not repeated; previous study demonstrates 34% proliferation rate [phone]). Non-neoplastic breast: Intraductal papilloma with fibrocystic change and usual ductal hyperplasia, fibrocystic change with usual ductal hyperplasia, microcalcifications, and previous biopsy site. TNM: pT2, pN0, pMX. Comments: Although there are definitive features of high grade invasive ductal carcinoma identified, there are multiple foci where the carcinoma assumes Beard spindle cell morphology / spindle cell differentiation. 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 There are no heterologous elements identified. (CRR:eps 09/15/12)  RADIOGRAPHIC STUDIES:  Chest 2 View  09/08/2012  *RADIOLOGY REPORT*  Clinical Data: Right-sided breast  CHEST - 2 VIEW  Comparison: Chest x-ray 12/06/2009.  Findings: Lung volumes are normal.  No consolidative airspace disease.  No pleural effusions.  No pneumothorax.  No pulmonary nodule or mass noted.  Pulmonary vasculature and the cardiomediastinal silhouette are within normal limits.  Mild bilateral apical pleuroparenchymal thickening most compatible with chronic scarring, unchanged compared to the prior examination.  IMPRESSION: 1. No radiographic evidence of acute cardiopulmonary disease.   Original Report Authenticated By: Trudie Reed, M.D.    Nm Sentinel Node Inj-no Rpt (breast)  09/11/2012  CLINICAL DATA: Invasive right breast cancer   Sulfur colloid was injected intradermally by the nuclear medicine  technologist for breast cancer sentinel node localization.      ASSESSMENT: 57 year old female  with  #1 invasive ductal carcinoma of the right breast status post bilateral mastectomies for Beard 2.3 cm node negative essentially triple negative disease on the right. Without any evidence of malignancy on the left. Patient's Beard candidate for adjuvant chemotherapy. We discussed Adriamycin Cytoxan dose dense followed by weekly Taxol Carbo. Since her tumor was 2% estrogen receptor positive we will utilize an aromatase inhibitor for 5 years. She understands the rationale for this.  #2 patient is also having considerable anxiety and will continue to take Effexor daily.    #4 she also began Prilosec 20 mg daily for reflux.  PLAN:   #1 Doing well. Ms. Kinkade labs are stable.  She will proceed with chemotherapy.  She will continue to take Super B complex daily.  She will use her moisturizer as needed.  We discussed her treatment and labs in detail.    #2 She will return in one week's time for labs and an appointment  for evaluation, and chemotherapy.    All questions were answered. The patient knows to call the clinic with any problems, questions or concerns. We can certainly see the patient much sooner if necessary.  I spent 25 minutes counseling the patient face to face. The total time spent in the appointment was 30 minutes.  Cherie Ouch Lyn Hollingshead, NP Medical Oncology [facility] Phone: [phone] 12/30/2012, 9:13 AM

## 2012-12-30 NOTE — Patient Instructions (Signed)
Doing well.  Proceed with chemotherapy.  Please call us if you have any questions or concerns.    

## 2013-01-06 ENCOUNTER — Encounter: Payer: Self-pay | Admitting: Adult Health

## 2013-01-06 ENCOUNTER — Ambulatory Visit (HOSPITAL_BASED_OUTPATIENT_CLINIC_OR_DEPARTMENT_OTHER): Payer: Private Health Insurance - Indemnity

## 2013-01-06 ENCOUNTER — Ambulatory Visit (HOSPITAL_BASED_OUTPATIENT_CLINIC_OR_DEPARTMENT_OTHER): Payer: Private Health Insurance - Indemnity | Admitting: Adult Health

## 2013-01-06 ENCOUNTER — Other Ambulatory Visit (HOSPITAL_BASED_OUTPATIENT_CLINIC_OR_DEPARTMENT_OTHER): Payer: Private Health Insurance - Indemnity | Admitting: Lab

## 2013-01-06 VITALS — BP 100/69 | HR 101 | Temp 98.3°F | Resp 20 | Ht 65.0 in | Wt 138.7 lb

## 2013-01-06 DIAGNOSIS — C50311 Malignant neoplasm of lower-inner quadrant of right female breast: Secondary | ICD-10-CM

## 2013-01-06 DIAGNOSIS — Z5111 Encounter for antineoplastic chemotherapy: Secondary | ICD-10-CM

## 2013-01-06 DIAGNOSIS — Z171 Estrogen receptor negative status [ER-]: Secondary | ICD-10-CM

## 2013-01-06 DIAGNOSIS — C50319 Malignant neoplasm of lower-inner quadrant of unspecified female breast: Secondary | ICD-10-CM

## 2013-01-06 DIAGNOSIS — K219 Gastro-esophageal reflux disease without esophagitis: Secondary | ICD-10-CM

## 2013-01-06 DIAGNOSIS — F411 Generalized anxiety disorder: Secondary | ICD-10-CM

## 2013-01-06 LAB — CBC WITH DIFFERENTIAL/PLATELET
BASO%: 0.3 % (ref 0.0–2.0)
Basophils Absolute: 0 10*3/uL (ref 0.0–0.1)
EOS%: 1.6 % (ref 0.0–7.0)
Eosinophils Absolute: 0.1 10*3/uL (ref 0.0–0.5)
HCT: 31.9 % — ABNORMAL LOW (ref 34.8–46.6)
HGB: 10.8 g/dL — ABNORMAL LOW (ref 11.6–15.9)
LYMPH%: 19.2 % (ref 14.0–49.7)
MCH: 29.1 pg (ref 25.1–34.0)
MCHC: 33.9 g/dL (ref 31.5–36.0)
MCV: 86 fL (ref 79.5–101.0)
MONO#: 0.3 10*3/uL (ref 0.1–0.9)
MONO%: 8.9 % (ref 0.0–14.0)
NEUT#: 2.2 10*3/uL (ref 1.5–6.5)
NEUT%: 70 % (ref 38.4–76.8)
Platelets: 214 10*3/uL (ref 145–400)
RBC: 3.71 10*6/uL (ref 3.70–5.45)
RDW: 25.2 % — ABNORMAL HIGH (ref 11.2–14.5)
WBC: 3.1 10*3/uL — ABNORMAL LOW (ref 3.9–10.3)
lymph#: 0.6 10*3/uL — ABNORMAL LOW (ref 0.9–3.3)
nRBC: 0 % (ref 0–0)

## 2013-01-06 LAB — COMPREHENSIVE METABOLIC PANEL (CC13)
ALT: 85 U/L — ABNORMAL HIGH (ref 0–55)
AST: 49 U/L — ABNORMAL HIGH (ref 5–34)
Albumin: 3.5 g/dL (ref 3.5–5.0)
Alkaline Phosphatase: 134 U/L (ref 40–150)
BUN: 13.5 mg/dL (ref 7.0–26.0)
CO2: 23 mEq/L (ref 22–29)
Calcium: 9.4 mg/dL (ref 8.4–10.4)
Chloride: 105 mEq/L (ref 98–107)
Creatinine: 0.6 mg/dL (ref 0.6–1.1)
Glucose: 97 mg/dl (ref 70–99)
Potassium: 3.9 mEq/L (ref 3.5–5.1)
Sodium: 138 mEq/L (ref 136–145)
Total Bilirubin: 0.49 mg/dL (ref 0.20–1.20)
Total Protein: 6.7 g/dL (ref 6.4–8.3)

## 2013-01-06 MED ORDER — SODIUM CHLORIDE 0.9 % IV SOLN
250.0000 mg | Freq: Once | INTRAVENOUS | Status: AC
  Administered 2013-01-06: 250 mg via INTRAVENOUS
  Filled 2013-01-06: qty 25

## 2013-01-06 MED ORDER — SODIUM CHLORIDE 0.9 % IV SOLN
Freq: Once | INTRAVENOUS | Status: AC
  Administered 2013-01-06: 10:00:00 via INTRAVENOUS

## 2013-01-06 MED ORDER — SODIUM CHLORIDE 0.9 % IJ SOLN
10.0000 mL | INTRAMUSCULAR | Status: DC | PRN
  Administered 2013-01-06: 10 mL
  Filled 2013-01-06: qty 10

## 2013-01-06 MED ORDER — SODIUM CHLORIDE 0.9 % IV SOLN
80.0000 mg/m2 | Freq: Once | INTRAVENOUS | Status: AC
  Administered 2013-01-06: 132 mg via INTRAVENOUS
  Filled 2013-01-06: qty 22

## 2013-01-06 MED ORDER — FAMOTIDINE IN NACL 20-0.9 MG/50ML-% IV SOLN
20.0000 mg | Freq: Once | INTRAVENOUS | Status: AC
  Administered 2013-01-06: 20 mg via INTRAVENOUS

## 2013-01-06 MED ORDER — DEXAMETHASONE SODIUM PHOSPHATE 20 MG/5ML IJ SOLN
20.0000 mg | Freq: Once | INTRAMUSCULAR | Status: AC
  Administered 2013-01-06: 20 mg via INTRAVENOUS

## 2013-01-06 MED ORDER — HEPARIN SOD (PORK) LOCK FLUSH 100 UNIT/ML IV SOLN
500.0000 [IU] | Freq: Once | INTRAVENOUS | Status: AC | PRN
  Administered 2013-01-06: 500 [IU]
  Filled 2013-01-06: qty 5

## 2013-01-06 MED ORDER — DIPHENHYDRAMINE HCL 50 MG/ML IJ SOLN
50.0000 mg | Freq: Once | INTRAMUSCULAR | Status: AC
  Administered 2013-01-06: 50 mg via INTRAVENOUS

## 2013-01-06 MED ORDER — ONDANSETRON 16 MG/50ML IVPB (CHCC)
16.0000 mg | Freq: Once | INTRAVENOUS | Status: AC
  Administered 2013-01-06: 16 mg via INTRAVENOUS

## 2013-01-06 NOTE — Patient Instructions (Addendum)
[facility] Discharge Instructions for Patients Receiving Chemotherapy  Today you received the following chemotherapy agents :  Taxol, Carboplatin.  To help prevent nausea and vomiting after your treatment, we encourage you to take your nausea medication as instructed by your physician.   If you develop nausea and vomiting that is not controlled by your nausea medication, call the clinic.   BELOW ARE SYMPTOMS THAT SHOULD BE REPORTED IMMEDIATELY:  *FEVER GREATER THAN 100.5 F  *CHILLS WITH OR WITHOUT FEVER  NAUSEA AND VOMITING THAT IS NOT CONTROLLED WITH YOUR NAUSEA MEDICATION  *UNUSUAL SHORTNESS OF BREATH  *UNUSUAL BRUISING OR BLEEDING  TENDERNESS IN MOUTH AND THROAT WITH OR WITHOUT PRESENCE OF ULCERS  *URINARY PROBLEMS  *BOWEL PROBLEMS  UNUSUAL RASH Items with * indicate a potential emergency and should be followed up as soon as possible.  Feel free to call the clinic you have any questions or concerns. The clinic phone number is [phone].    

## 2013-01-06 NOTE — Patient Instructions (Signed)
Doing well.  Proceed with chemotherapy.  Please call us if you have any questions or concerns.    

## 2013-01-06 NOTE — Progress Notes (Signed)
OFFICE PROGRESS NOTE  CC  Nancy Mew, MD [address] [place] [address] Dr. Lurline Hare  Dr. Avel Peace  DIAGNOSIS: 57 year old female with new diagnosis of stage II a breast cancer that is triple negative    STAGE:  Cancer of lower-inner quadrant of female breast  Primary site: Breast (Right)  Staging method: AJCC 7th Edition  Clinical: Stage IIA (T2, N0, cM0)  Summary: Stage IA (T2, N0, cM0)  PRIOR THERAPY: #1Patient's last mammogram was about 5 years ago. Recently she underwent a screening mammogram that showed a spiculated mass in the lower inner quadrant of the right breast. She had ultrasound performed that showed irregular mass at the 5:00 position 3 cm from the nipple measuring 1.1 cm. She had a needle core biopsy performed on 06/19/2012 the biopsy showed invasive ductal carcinoma with ductal carcinoma in situ grade 3 tumor was ER negative PR negative HER-2/neu negative. Ki-67 was elevated at 34%. She went on to have MRI of the breasts performed on 06/24/2012 the MRI showed in the middle third of the lower inner quadrant of the right breast a 1.5 x 1.3 x 1.9 cm irregular enhancing mass. No abnormal enhancement was seen in the left breast no enlarged axillary or internal mammary adenopathy was detected.   #2 patient is now status post bilateral mastectomies. On her right mastectomy she was found to have a 2.3 cm invasive ductal carcinoma 01 lymph nodes were positive for metastatic disease and this is T2 N0) stage II. Tumor was grade 3 ER 2% PR negative HER-2/neu negative with a Ki-67 at 34%. Left mastectomy only showed intraductal papilloma node atypia or malignancy was found.  #3 patient is now being considered for adjuvant chemotherapy since her tumor is essentially triple negative and is a stage II. We discussed the rationale for adjuvant chemotherapy. She will receive Adriamycin Cytoxan every 2 weeks for a total of 4 cycles with G-CSF support. Once  she completes this then she will receive Taxol and carboplatinum every week for a total of 12 weeks. I did discuss with her use of antiestrogen therapy such as an aromatase inhibitor since her tumor is 2% ER positive. I discussed the rationale for this.  CURRENT THERAPY: Taxol Carbo week 4  INTERVAL HISTORY: Nancy Beard 57 y.o. female returns for followup of her right breast cancer.  She has mild numbness in the very tips of her fingers.  She denies any motor difficulties such as buttoning, or putting in earrings.  She continues to knit without difficulty.  She continues to have mild peeling on her great toes bilaterally.  She is moisturizing them daily.  She does not have any dryness on her hands or anywhere else on her feet.  She denies fevers, chills, nausea, vomiting, constipation, diarrhea, or any further concerns.  Otherwise a 10 point ROS is negative.    MEDICAL HISTORY: Past Medical History  Diagnosis Date  . Thyroid disease   . Depression   . Sprue   . Diverticulosis   . Hyperlipidemia   . Breast cancer     right  . Hypothyroidism   . Pneumonia     ALLERGIES:  is allergic to ciprofloxacin and gluten meal.  MEDICATIONS:  Current Outpatient Prescriptions  Medication Sig Dispense Refill  . acetaminophen (TYLENOL) 325 MG tablet Take 650 mg by mouth as needed.      . B Complex-C (SUPER B COMPLEX PO) Take 1 tablet by mouth daily.      [name]  dexamethasone (DECADRON) 4 MG tablet Take 2 tablets (8 mg total) by mouth 2 (two) times daily with a meal. Take two times a day starting the day after chemotherapy for 3 days.  30 tablet  1  . HYDROcodone-acetaminophen (NORCO/VICODIN) 5-325 MG per tablet Take 1 tablet by mouth every 6 (six) hours as needed.  30 tablet  0  . levothyroxine (SYNTHROID, LEVOTHROID) 25 MCG tablet Take 25 mcg by mouth daily.      [name] lidocaine-prilocaine (EMLA) cream Apply topically as needed.  30 g  8  . liothyronine (CYTOMEL) 25 MCG tablet Take 12.5 mcg by mouth  daily.      [name] LORazepam (ATIVAN) 0.5 MG tablet Take 1 tablet (0.5 mg total) by mouth every 6 (six) hours as needed (Nausea or vomiting).  30 tablet  0  . Multiple Vitamin (MULTIVITAMIN) tablet Take 1 tablet by mouth daily.      . ondansetron (ZOFRAN) 8 MG tablet Take 1 tablet (8 mg total) by mouth 2 (two) times daily. Take two times a day starting the day after chemo for 3 days. Then take two times a day as needed for nausea or vomiting.  30 tablet  1  . prochlorperazine (COMPAZINE) 10 MG tablet Take 1 tablet (10 mg total) by mouth every 6 (six) hours as needed (Nausea or vomiting).  30 tablet  1  . prochlorperazine (COMPAZINE) 25 MG suppository Place 1 suppository (25 mg total) rectally every 12 (twelve) hours as needed for nausea.  12 suppository  3  . valACYclovir (VALTREX) 500 MG tablet Take 1 tablet (500 mg total) by mouth 2 (two) times daily.  60 tablet  6  . venlafaxine XR (EFFEXOR-XR) 75 MG 24 hr capsule Take 1 capsule (75 mg total) by mouth daily.  90 capsule  3   No current facility-administered medications for this visit.    SURGICAL HISTORY:  Past Surgical History  Procedure Laterality Date  . Esophagogastroduodenoscopy  2007    sprue  . Colonoscopy  2006  . Cryoblation of cervix    . Breast lumpectomy    . Total mastectomy Bilateral 09/11/2012    Procedure: bilateral MASTECTOMY;  Surgeon: Adolph Pollack, MD;  Location: [facility] OR;  Service: General;  Laterality: Bilateral;  . Axillary sentinel node biopsy Right 09/11/2012    Procedure: AXILLARY SENTINEL lymph NODE  BIOPSY;  Surgeon: Adolph Pollack, MD;  Location: [facility] OR;  Service: General;  Laterality: Right;  right nuclear medicine injection 12:30   . Breast reconstruction with placement of tissue expander and flex hd (acellular hydrated dermis) Bilateral 09/11/2012    Procedure: BREAST RECONSTRUCTION WITH PLACEMENT OF TISSUE EXPANDER AND FLEX HD (ACELLULAR HYDRATED DERMIS) ADM;  Surgeon: Wayland Denis, DO;  Location: [facility] OR;   Service: Plastics;  Laterality: Bilateral;  . Incision and drainage of wound Left 09/16/2012    Procedure: Left Breast Evacuation of Hematoma;  Surgeon: Wayland Denis, DO;  Location: [facility] OR;  Service: Plastics;  Laterality: Left;  . Portacath placement Right 10/09/2012    Procedure: US GUIDED INSERTION PORT-A-CATH;  Surgeon: Adolph Pollack, MD;  Location: [facility];  Service: General;  Laterality: Right;  Right Subclavian Vein    REVIEW OF SYSTEMS:   General: fatigue (+), night sweats (-), fever (-), pain (-) Lymph: palpable nodes (-) HEENT: vision changes (-), mucositis (-), gum bleeding (-), epistaxis (-) Cardiovascular: chest pain (-), palpitations (-) Pulmonary: shortness of breath (-), dyspnea on exertion (-), cough (-), hemoptysis (-)  GI:  Early satiety (-), melena (-), dysphagia (-), nausea/vomiting (-), diarrhea (-) GU: dysuria (-), hematuria (-), incontinence (-) Musculoskeletal: joint swelling (-), joint pain (-), back pain (-) Neuro: weakness (-), numbness (+), headache (-), confusion (-) Skin: Rash (-), lesions (-), dryness (-) Psych: depression (-), suicidal/homicidal ideation (-), feeling of hopelessness (-)   PHYSICAL EXAMINATION: Blood pressure 100/69, pulse 101, temperature 98.3 F (36.8 C), temperature source Oral, resp. rate 20, height 5\' 5"  (1.651 m), weight 138 lb 11.2 oz (62.914 kg), last menstrual period 09/11/2012. Body mass index is 23.08 kg/(m^2). General: Patient is a well appearing female in no acute distress HEENT: PERRLA, sclerae anicteric no conjunctival pallor, MMM Neck: supple, no palpable adenopathy Lungs: clear to auscultation bilaterally, no wheezes, rhonchi, or rales Cardiovascular: regular rate rhythm, S1, S2, no murmurs, rubs or gallops Abdomen: Soft, non-tender, non-distended, normoactive bowel sounds, no HSM Extremities: warm and well perfused, no clubbing, cyanosis, or edema Skin: No rashes or lesions, scalp erythema appears to  be a birthmark Neuro: Non-focal Breasts: bilateral mastectomy sites are well healed, no nodularity, masses, or skin changes, expanders in place. ECOG PERFORMANCE STATUS: 1 - Symptomatic but completely ambulatory  LABORATORY DATA: Lab Results  Component Value Date   WBC 3.1* 01/06/2013   HGB 10.8* 01/06/2013   HCT 31.9* 01/06/2013   MCV 86.0 01/06/2013   PLT 214 01/06/2013      Chemistry      Component Value Date/Time   NA 139 12/30/2012 0846   NA 138 09/15/2012 2035   K 4.0 12/30/2012 0846   K 3.6 09/15/2012 2035   CL 107 12/30/2012 0846   CL 103 09/15/2012 2035   CO2 24 12/30/2012 0846   CO2 26 09/15/2012 2035   BUN 7.2 12/30/2012 0846   BUN 4* 09/15/2012 2035   CREATININE 0.6 12/30/2012 0846   CREATININE 0.54 09/15/2012 2035      Component Value Date/Time   CALCIUM 9.4 12/30/2012 0846   CALCIUM 9.0 09/15/2012 2035   ALKPHOS 136 12/30/2012 0846   ALKPHOS 142* 09/15/2012 2035   AST 41* 12/30/2012 0846   AST 32 09/15/2012 2035   ALT 63* 12/30/2012 0846   ALT 24 09/15/2012 2035   BILITOT 0.39 12/30/2012 0846   BILITOT 0.8 09/15/2012 2035     ADDITIONAL INFORMATION: 3. CHROMOGENIC IN-SITU HYBRIDIZATION Interpretation HER-2/NEU BY CISH - NO AMPLIFICATION OF HER-2 DETECTED. THE RATIO OF HER-2: CEP 17 SIGNALS WAS 1.52. Reference range: Ratio: HER2:CEP17 < 1.8 - gene amplification not observed Ratio: HER2:CEP 17 1.8-2.2 - equivocal result Ratio: HER2:CEP17 > 2.2 - gene amplification observed Pecola Leisure MD Pathologist, Electronic Signature ( Signed 09/22/2012) 3. PROGNOSTIC INDICATORS - ACIS Results IMMUNOHISTOCHEMICAL AND MORPHOMETRIC ANALYSIS BY THE AUTOMATED CELLULAR IMAGING SYSTEM (ACIS) Estrogen Receptor (Negative, <1%): 2%, POSITIVE, WEAK STAINING INTENSITY Progesterone Receptor (Negative, <1%): 0%, NEGATIVE COMMENT: The negative hormone receptor study in this case has an internal positive control. All controls stained appropriately Pecola Leisure MD Pathologist, Electronic Signature ( Signed  09/22/2012) 1 of 4 FINAL for [place], Samar A [phone]) FINAL DIAGNOSIS Diagnosis 1. Lymph node, sentinel, biopsy, Right axilla - ONE LYMPH NODE, NEGATIVE FOR TUMOR (0/1). 2. Breast, simple mastectomy, Left - BENIGN BREAST TISSUE WITH INTRADUCTAL PAPILLOMA, SEE COMMENT. - NEGATIVE FOR ATYPIA OR MALIGNANCY. - SURGICAL MARGINS, NEGATIVE FOR ATYPIA OR MALIGNANCY. - MICROCALCIFICATIONS IDENTIFIED. 3. Breast, simple mastectomy, Right - INVASIVE DUCTAL CARCINOMA, GRADE III, WITH SPINDLE CELL DIFFERENTIATION (METAPLASTIC CARCINOMA) (2.3CM), SEE COMMENT. - NO LYMPHOVASCULAR INVASION IDENTIFIED. - INVASIVE TUMOR  IS 1.5 CM FROM NEAREST MARGIN (DEEP). - DUCTAL CARCINOMA IN SITU, GRADE III, WITH COMEDONECROSIS. - SEE TUMOR SYNOPTIC TEMPLATE BELOW. Microscopic Comment 2. Although there was no mass grossly identified, there are definitive morphologic features of intraductal papilloma with associated fibrocystic change present. In addition, there are fibrocystic changes with usual ductal hyperplasia in representative sections not involved by papilloma. Finally, foci of sclerosing adenosis and microcalcifications in benign ducts and lobules are present. The surgical resection margin(s) of the specimen were inked and microscopically evaluated. 3. BREAST, INVASIVE TUMOR, WITH LYMPH NODE SAMPLING Specimen, including laterality: Right breast. Procedure: Simple mastectomy. Grade: III of III. Tubule formation: 3. Nuclear pleomorphism: 3. Mitotic: 2. Tumor size (gross measurement): 2.3 cm. Margins: Invasive, distance to closest margin: 1.5 cm. In-situ, distance to closest margin: 1.5 cm (deep). If margin positive, focally or broadly: N/A. Lymphovascular invasion: Absent. Ductal carcinoma in situ: Present. Grade: III of III. Extensive intraductal component: Absent. Lobular neoplasia: Absent. Tumor focality: Unifocal. Treatment effect: None. If present, treatment effect in breast tissue, lymph  nodes or both: N/A. Extent of tumor: Skin and Nipple: Grossly negative. Skeletal muscle: N/A. Lymph nodes: # examined: 1. Lymph nodes with metastasis: 0. Breast prognostic profile: Estrogen receptor: Repeated; previous study demonstrates 0% positivity ([id number]). Progesterone receptor: Repeated; previous study demonstrates 0% positivity ([id number]). HER-2/neu: Repeated; previous study demonstrated no amplification (1.55) ([id number]). 2 of 4 FINAL for JOEANN, STEPPE A [phone]) Microscopic Comment(continued) Ki-67: Not repeated; previous study demonstrates 34% proliferation rate [id number]). Non-neoplastic breast: Intraductal papilloma with fibrocystic change and usual ductal hyperplasia, fibrocystic change with usual ductal hyperplasia, microcalcifications, and previous biopsy site. TNM: pT2, pN0, pMX. Comments: Although there are definitive features of high grade invasive ductal carcinoma identified, there are multiple foci where the carcinoma assumes a spindle cell morphology / spindle cell differentiation. 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 There are no heterologous elements identified. (CRR:eps 09/15/12)  RADIOGRAPHIC STUDIES:  Chest 2 View  09/08/2012  *RADIOLOGY REPORT*  Clinical Data: Right-sided breast  CHEST - 2 VIEW  Comparison: Chest x-ray 12/06/2009.  Findings: Lung volumes are normal.  No consolidative airspace disease.  No pleural effusions.  No pneumothorax.  No pulmonary nodule or mass noted.  Pulmonary vasculature and the cardiomediastinal silhouette are within normal limits.  Mild bilateral apical pleuroparenchymal thickening most compatible with chronic scarring, unchanged compared to the prior examination.  IMPRESSION: 1. No radiographic evidence of acute cardiopulmonary disease.   Original Report Authenticated By: Trudie Reed, M.D.    Nm Sentinel Node Inj-no Rpt  (breast)  09/11/2012  CLINICAL DATA: Invasive right breast cancer   Sulfur colloid was injected intradermally by the nuclear medicine  technologist for breast cancer sentinel node localization.      ASSESSMENT: 57 year old female with  #1 invasive ductal carcinoma of the right breast status post bilateral mastectomies for a 2.3 cm node negative essentially triple negative disease on the right. Without any evidence of malignancy on the left. Patient's a candidate for adjuvant chemotherapy. We discussed Adriamycin Cytoxan dose dense followed by weekly Taxol Carbo. Since her tumor was 2% estrogen receptor positive we will utilize an aromatase inhibitor for 5 years. She understands the rationale for this.  #2 patient is also having considerable anxiety and will continue to take Effexor daily.    #3 she also began Prilosec 20 mg daily for reflux.  PLAN:   #1 Doing well. Ms. Paget labs are stable.  She will  proceed with chemotherapy.  She will continue to take Super B complex daily.  She will use her moisturizer as needed. She and I discussed neuropathy in detail, and she will pay close attention to any worsening in her fingertips.    #2 She will return in one week's time for labs and an appointment for evaluation, and chemotherapy.    All questions were answered. The patient knows to call the clinic with any problems, questions or concerns. We can certainly see the patient much sooner if necessary.  I spent 25 minutes counseling the patient face to face. The total time spent in the appointment was 30 minutes.  Cherie Ouch Lyn Hollingshead, NP [facility] Phone: [phone] 01/06/2013, 9:24 AM

## 2013-01-13 ENCOUNTER — Telehealth: Payer: Self-pay | Admitting: *Deleted

## 2013-01-13 ENCOUNTER — Encounter: Payer: Self-pay | Admitting: Adult Health

## 2013-01-13 ENCOUNTER — Ambulatory Visit (HOSPITAL_BASED_OUTPATIENT_CLINIC_OR_DEPARTMENT_OTHER): Payer: Private Health Insurance - Indemnity | Admitting: Adult Health

## 2013-01-13 ENCOUNTER — Ambulatory Visit (HOSPITAL_BASED_OUTPATIENT_CLINIC_OR_DEPARTMENT_OTHER): Payer: Private Health Insurance - Indemnity

## 2013-01-13 ENCOUNTER — Other Ambulatory Visit (HOSPITAL_BASED_OUTPATIENT_CLINIC_OR_DEPARTMENT_OTHER): Payer: Private Health Insurance - Indemnity | Admitting: Lab

## 2013-01-13 VITALS — BP 129/88 | HR 124 | Temp 98.2°F | Resp 20 | Ht 65.0 in | Wt 139.9 lb

## 2013-01-13 VITALS — HR 98

## 2013-01-13 DIAGNOSIS — C50311 Malignant neoplasm of lower-inner quadrant of right female breast: Secondary | ICD-10-CM

## 2013-01-13 DIAGNOSIS — F411 Generalized anxiety disorder: Secondary | ICD-10-CM

## 2013-01-13 DIAGNOSIS — E860 Dehydration: Secondary | ICD-10-CM

## 2013-01-13 DIAGNOSIS — Z5111 Encounter for antineoplastic chemotherapy: Secondary | ICD-10-CM

## 2013-01-13 DIAGNOSIS — C50319 Malignant neoplasm of lower-inner quadrant of unspecified female breast: Secondary | ICD-10-CM

## 2013-01-13 DIAGNOSIS — Z452 Encounter for adjustment and management of vascular access device: Secondary | ICD-10-CM

## 2013-01-13 DIAGNOSIS — Z171 Estrogen receptor negative status [ER-]: Secondary | ICD-10-CM

## 2013-01-13 LAB — CBC WITH DIFFERENTIAL/PLATELET
BASO%: 1.1 % (ref 0.0–2.0)
Basophils Absolute: 0 10e3/uL (ref 0.0–0.1)
EOS%: 1.1 % (ref 0.0–7.0)
Eosinophils Absolute: 0 10e3/uL (ref 0.0–0.5)
HCT: 33.7 % — ABNORMAL LOW (ref 34.8–46.6)
HGB: 11.2 g/dL — ABNORMAL LOW (ref 11.6–15.9)
LYMPH%: 29.1 % (ref 14.0–49.7)
MCH: 29.6 pg (ref 25.1–34.0)
MCHC: 33.2 g/dL (ref 31.5–36.0)
MCV: 89.2 fL (ref 79.5–101.0)
MONO#: 0.2 10e3/uL (ref 0.1–0.9)
MONO%: 8.8 % (ref 0.0–14.0)
NEUT#: 1.6 10e3/uL (ref 1.5–6.5)
NEUT%: 59.9 % (ref 38.4–76.8)
Platelets: 257 10e3/uL (ref 145–400)
RBC: 3.78 10e6/uL (ref 3.70–5.45)
RDW: 24.4 % — ABNORMAL HIGH (ref 11.2–14.5)
WBC: 2.6 10e3/uL — ABNORMAL LOW (ref 3.9–10.3)
lymph#: 0.8 10e3/uL — ABNORMAL LOW (ref 0.9–3.3)
nRBC: 0 % (ref 0–0)

## 2013-01-13 LAB — COMPREHENSIVE METABOLIC PANEL (CC13)
ALT: 71 U/L — ABNORMAL HIGH (ref 0–55)
AST: 47 U/L — ABNORMAL HIGH (ref 5–34)
Albumin: 3.3 g/dL — ABNORMAL LOW (ref 3.5–5.0)
Alkaline Phosphatase: 131 U/L (ref 40–150)
BUN: 12.5 mg/dL (ref 7.0–26.0)
CO2: 26 mEq/L (ref 22–29)
Calcium: 9 mg/dL (ref 8.4–10.4)
Chloride: 106 mEq/L (ref 98–109)
Creatinine: 0.6 mg/dL (ref 0.6–1.1)
Glucose: 93 mg/dl (ref 70–140)
Potassium: 3.8 mEq/L (ref 3.5–5.1)
Sodium: 139 mEq/L (ref 136–145)
Total Bilirubin: 0.43 mg/dL (ref 0.20–1.20)
Total Protein: 6.3 g/dL — ABNORMAL LOW (ref 6.4–8.3)

## 2013-01-13 MED ORDER — DEXAMETHASONE SODIUM PHOSPHATE 20 MG/5ML IJ SOLN
20.0000 mg | Freq: Once | INTRAMUSCULAR | Status: AC
  Administered 2013-01-13: 20 mg via INTRAVENOUS

## 2013-01-13 MED ORDER — FAMOTIDINE IN NACL 20-0.9 MG/50ML-% IV SOLN
20.0000 mg | Freq: Once | INTRAVENOUS | Status: AC
  Administered 2013-01-13: 20 mg via INTRAVENOUS

## 2013-01-13 MED ORDER — ONDANSETRON 16 MG/50ML IVPB (CHCC)
16.0000 mg | Freq: Once | INTRAVENOUS | Status: AC
  Administered 2013-01-13: 16 mg via INTRAVENOUS

## 2013-01-13 MED ORDER — ALTEPLASE 2 MG IJ SOLR
2.0000 mg | Freq: Once | INTRAMUSCULAR | Status: AC | PRN
  Administered 2013-01-13: 2 mg
  Filled 2013-01-13: qty 2

## 2013-01-13 MED ORDER — SODIUM CHLORIDE 0.9 % IV SOLN
1000.0000 mL | Freq: Once | INTRAVENOUS | Status: AC
  Administered 2013-01-13: 1000 mL via INTRAVENOUS

## 2013-01-13 MED ORDER — SODIUM CHLORIDE 0.9 % IV SOLN
250.0000 mg | Freq: Once | INTRAVENOUS | Status: AC
  Administered 2013-01-13: 250 mg via INTRAVENOUS
  Filled 2013-01-13: qty 25

## 2013-01-13 MED ORDER — SODIUM CHLORIDE 0.9 % IV SOLN
Freq: Once | INTRAVENOUS | Status: AC
  Administered 2013-01-13: 12:00:00 via INTRAVENOUS

## 2013-01-13 MED ORDER — SODIUM CHLORIDE 0.9 % IV SOLN
80.0000 mg/m2 | Freq: Once | INTRAVENOUS | Status: AC
  Administered 2013-01-13: 132 mg via INTRAVENOUS
  Filled 2013-01-13: qty 22

## 2013-01-13 MED ORDER — SODIUM CHLORIDE 0.9 % IJ SOLN
10.0000 mL | INTRAMUSCULAR | Status: DC | PRN
  Administered 2013-01-13: 10 mL
  Filled 2013-01-13: qty 10

## 2013-01-13 MED ORDER — DIPHENHYDRAMINE HCL 50 MG/ML IJ SOLN
50.0000 mg | Freq: Once | INTRAMUSCULAR | Status: AC
  Administered 2013-01-13: 50 mg via INTRAVENOUS

## 2013-01-13 MED ORDER — HEPARIN SOD (PORK) LOCK FLUSH 100 UNIT/ML IV SOLN
500.0000 [IU] | Freq: Once | INTRAVENOUS | Status: AC | PRN
  Administered 2013-01-13: 500 [IU]
  Filled 2013-01-13: qty 5

## 2013-01-13 NOTE — Progress Notes (Signed)
OFFICE PROGRESS NOTE  CC  Nancy Mew, MD [address] [place] [address] Dr. Lurline Hare  Dr. Avel Peace  DIAGNOSIS: 57 year old female with new diagnosis of stage II a breast cancer that is triple negative    STAGE:  Cancer of lower-inner quadrant of female breast  Primary site: Breast (Right)  Staging method: AJCC 7th Edition  Clinical: Stage IIA (T2, N0, cM0)  Summary: Stage IA (T2, N0, cM0)  PRIOR THERAPY: #1Patient's last mammogram was about 5 years ago. Recently she underwent a screening mammogram that showed a spiculated mass in the lower inner quadrant of the right breast. She had ultrasound performed that showed irregular mass at the 5:00 position 3 cm from the nipple measuring 1.1 cm. She had a needle core biopsy performed on 06/19/2012 the biopsy showed invasive ductal carcinoma with ductal carcinoma in situ grade 3 tumor was ER negative PR negative HER-2/neu negative. Ki-67 was elevated at 34%. She went on to have MRI of the breasts performed on 06/24/2012 the MRI showed in the middle third of the lower inner quadrant of the right breast a 1.5 x 1.3 x 1.9 cm irregular enhancing mass. No abnormal enhancement was seen in the left breast no enlarged axillary or internal mammary adenopathy was detected.   #2 patient is now status post bilateral mastectomies. On her right mastectomy she was found to have a 2.3 cm invasive ductal carcinoma 01 lymph nodes were positive for metastatic disease and this is T2 N0) stage II. Tumor was grade 3 ER 2% PR negative HER-2/neu negative with a Ki-67 at 34%. Left mastectomy only showed intraductal papilloma node atypia or malignancy was found.  #3 patient is now being considered for adjuvant chemotherapy since her tumor is essentially triple negative and is a stage II. We discussed the rationale for adjuvant chemotherapy. She will receive Adriamycin Cytoxan every 2 weeks for a total of 4 cycles with G-CSF support. Once  she completes this then she will receive Taxol and carboplatinum every week for a total of 12 weeks. I did discuss with her use of antiestrogen therapy such as an aromatase inhibitor since her tumor is 2% ER positive. I discussed the rationale for this.  CURRENT THERAPY: Taxol Carbo week 5  INTERVAL HISTORY: TALLIE DODDS 57 y.o. female returns for followup of her right breast cancer.  She is drinking about 36 ounces of water per day.  The numbness in her fingertips and toes is improved.  She continues to have very mild peeling of her great toes, which is improving, there is no pain or cracking of the skin.  She's had mild constipation that she is trying to correct with diet.  Otherwise, she denies fevers, chills, nausea, vomiting, diarrhea, shortness of breath, chest pain, palpitations, cough, or any further concerns.  A 10 point ROS is otherwise negative.    MEDICAL HISTORY: Past Medical History  Diagnosis Date  . Thyroid disease   . Depression   . Sprue   . Diverticulosis   . Hyperlipidemia   . Breast cancer     right  . Hypothyroidism   . Pneumonia     ALLERGIES:  is allergic to ciprofloxacin and gluten meal.  MEDICATIONS:  Current Outpatient Prescriptions  Medication Sig Dispense Refill  . acetaminophen (TYLENOL) 325 MG tablet Take 650 mg by mouth as needed.      . B Complex-C (SUPER B COMPLEX PO) Take 1 tablet by mouth daily.      [name] dexamethasone (  DECADRON) 4 MG tablet Take 2 tablets (8 mg total) by mouth 2 (two) times daily with a meal. Take two times a day starting the day after chemotherapy for 3 days.  30 tablet  1  . HYDROcodone-acetaminophen (NORCO/VICODIN) 5-325 MG per tablet Take 1 tablet by mouth every 6 (six) hours as needed.  30 tablet  0  . levothyroxine (SYNTHROID, LEVOTHROID) 25 MCG tablet Take 25 mcg by mouth daily.      [name] lidocaine-prilocaine (EMLA) cream Apply topically as needed.  30 g  8  . liothyronine (CYTOMEL) 25 MCG tablet Take 12.5 mcg by mouth daily.       [name] LORazepam (ATIVAN) 0.5 MG tablet Take 1 tablet (0.5 mg total) by mouth every 6 (six) hours as needed (Nausea or vomiting).  30 tablet  0  . Multiple Vitamin (MULTIVITAMIN) tablet Take 1 tablet by mouth daily.      . ondansetron (ZOFRAN) 8 MG tablet Take 1 tablet (8 mg total) by mouth 2 (two) times daily. Take two times a day starting the day after chemo for 3 days. Then take two times a day as needed for nausea or vomiting.  30 tablet  1  . prochlorperazine (COMPAZINE) 10 MG tablet Take 1 tablet (10 mg total) by mouth every 6 (six) hours as needed (Nausea or vomiting).  30 tablet  1  . prochlorperazine (COMPAZINE) 25 MG suppository Place 1 suppository (25 mg total) rectally every 12 (twelve) hours as needed for nausea.  12 suppository  3  . valACYclovir (VALTREX) 500 MG tablet Take 1 tablet (500 mg total) by mouth 2 (two) times daily.  60 tablet  6  . venlafaxine XR (EFFEXOR-XR) 75 MG 24 hr capsule Take 1 capsule (75 mg total) by mouth daily.  90 capsule  3   No current facility-administered medications for this visit.    SURGICAL HISTORY:  Past Surgical History  Procedure Laterality Date  . Esophagogastroduodenoscopy  2007    sprue  . Colonoscopy  2006  . Cryoblation of cervix    . Breast lumpectomy    . Total mastectomy Bilateral 09/11/2012    Procedure: bilateral MASTECTOMY;  Surgeon: Adolph Pollack, MD;  Location: [facility] OR;  Service: General;  Laterality: Bilateral;  . Axillary sentinel node biopsy Right 09/11/2012    Procedure: AXILLARY SENTINEL lymph NODE  BIOPSY;  Surgeon: Adolph Pollack, MD;  Location: [facility] OR;  Service: General;  Laterality: Right;  right nuclear medicine injection 12:30   . Breast reconstruction with placement of tissue expander and flex hd (acellular hydrated dermis) Bilateral 09/11/2012    Procedure: BREAST RECONSTRUCTION WITH PLACEMENT OF TISSUE EXPANDER AND FLEX HD (ACELLULAR HYDRATED DERMIS) ADM;  Surgeon: Wayland Denis, DO;  Location: [facility] OR;  Service:  Plastics;  Laterality: Bilateral;  . Incision and drainage of wound Left 09/16/2012    Procedure: Left Breast Evacuation of Hematoma;  Surgeon: Wayland Denis, DO;  Location: [facility] OR;  Service: Plastics;  Laterality: Left;  . Portacath placement Right 10/09/2012    Procedure: US GUIDED INSERTION PORT-A-CATH;  Surgeon: Adolph Pollack, MD;  Location: [facility];  Service: General;  Laterality: Right;  Right Subclavian Vein    REVIEW OF SYSTEMS:   General: fatigue (+), night sweats (-), fever (-), pain (-) Lymph: palpable nodes (-) HEENT: vision changes (-), mucositis (-), gum bleeding (-), epistaxis (-) Cardiovascular: chest pain (-), palpitations (-) Pulmonary: shortness of breath (-), dyspnea on exertion (-), cough (-), hemoptysis (-) GI:  Early satiety (-), melena (-), dysphagia (-), nausea/vomiting (-), diarrhea (-) GU: dysuria (-), hematuria (-), incontinence (-) Musculoskeletal: joint swelling (-), joint pain (-), back pain (-) Neuro: weakness (-), numbness (+), headache (-), confusion (-) Skin: Rash (-), lesions (-), dryness (-) Psych: depression (-), suicidal/homicidal ideation (-), feeling of hopelessness (-)   PHYSICAL EXAMINATION: Blood pressure 129/88, pulse 124, temperature 98.2 F (36.8 C), temperature source Oral, resp. rate 20, height 5\' 5"  (1.651 m), weight 139 lb 14.4 oz (63.458 kg), last menstrual period 09/11/2012. Body mass index is 23.28 kg/(m^2). General: Patient is a well appearing female in no acute distress HEENT: PERRLA, sclerae anicteric no conjunctival pallor, MMM Neck: supple, no palpable adenopathy Lungs: clear to auscultation bilaterally, no wheezes, rhonchi, or rales Cardiovascular: regular rate rhythm, S1, S2, no murmurs, rubs or gallops Abdomen: Soft, non-tender, non-distended, normoactive bowel sounds, no HSM Extremities: warm and well perfused, no clubbing, cyanosis, or edema Skin: No rashes or lesions, scalp erythema appears to be a  birthmark Neuro: Non-focal Breasts: bilateral mastectomy sites are well healed, no nodularity, masses, or skin changes, expanders in place. ECOG PERFORMANCE STATUS: 1 - Symptomatic but completely ambulatory  LABORATORY DATA: Lab Results  Component Value Date   WBC 2.6* 01/13/2013   HGB 11.2* 01/13/2013   HCT 33.7* 01/13/2013   MCV 89.2 01/13/2013   PLT 257 01/13/2013      Chemistry      Component Value Date/Time   NA 138 01/06/2013 0827   NA 138 09/15/2012 2035   K 3.9 01/06/2013 0827   K 3.6 09/15/2012 2035   CL 105 01/06/2013 0827   CL 103 09/15/2012 2035   CO2 23 01/06/2013 0827   CO2 26 09/15/2012 2035   BUN 13.5 01/06/2013 0827   BUN 4* 09/15/2012 2035   CREATININE 0.6 01/06/2013 0827   CREATININE 0.54 09/15/2012 2035      Component Value Date/Time   CALCIUM 9.4 01/06/2013 0827   CALCIUM 9.0 09/15/2012 2035   ALKPHOS 134 01/06/2013 0827   ALKPHOS 142* 09/15/2012 2035   AST 49* 01/06/2013 0827   AST 32 09/15/2012 2035   ALT 85* 01/06/2013 0827   ALT 24 09/15/2012 2035   BILITOT 0.49 01/06/2013 0827   BILITOT 0.8 09/15/2012 2035     ADDITIONAL INFORMATION: 3. CHROMOGENIC IN-SITU HYBRIDIZATION Interpretation HER-2/NEU BY CISH - NO AMPLIFICATION OF HER-2 DETECTED. THE RATIO OF HER-2: CEP 17 SIGNALS WAS 1.52. Reference range: Ratio: HER2:CEP17 < 1.8 - gene amplification not observed Ratio: HER2:CEP 17 1.8-2.2 - equivocal result Ratio: HER2:CEP17 > 2.2 - gene amplification observed Pecola Leisure MD Pathologist, Electronic Signature ( Signed 09/22/2012) 3. PROGNOSTIC INDICATORS - ACIS Results IMMUNOHISTOCHEMICAL AND MORPHOMETRIC ANALYSIS BY THE AUTOMATED CELLULAR IMAGING SYSTEM (ACIS) Estrogen Receptor (Negative, <1%): 2%, POSITIVE, WEAK STAINING INTENSITY Progesterone Receptor (Negative, <1%): 0%, NEGATIVE COMMENT: The negative hormone receptor study in this case has an internal positive control. All controls stained appropriately Pecola Leisure MD Pathologist, Electronic Signature ( Signed  09/22/2012) 1 of 4 FINAL for [place], [place] A [phone]) FINAL DIAGNOSIS Diagnosis 1. Lymph node, sentinel, biopsy, Right axilla - ONE LYMPH NODE, NEGATIVE FOR TUMOR (0/1). 2. Breast, simple mastectomy, Left - BENIGN BREAST TISSUE WITH INTRADUCTAL PAPILLOMA, SEE COMMENT. - NEGATIVE FOR ATYPIA OR MALIGNANCY. - SURGICAL MARGINS, NEGATIVE FOR ATYPIA OR MALIGNANCY. - MICROCALCIFICATIONS IDENTIFIED. 3. Breast, simple mastectomy, Right - INVASIVE DUCTAL CARCINOMA, GRADE III, WITH SPINDLE CELL DIFFERENTIATION (METAPLASTIC CARCINOMA) (2.3CM), SEE COMMENT. - NO LYMPHOVASCULAR INVASION IDENTIFIED. - INVASIVE TUMOR IS 1.5  CM FROM NEAREST MARGIN (DEEP). - DUCTAL CARCINOMA IN SITU, GRADE III, WITH COMEDONECROSIS. - SEE TUMOR SYNOPTIC TEMPLATE BELOW. Microscopic Comment 2. Although there was no mass grossly identified, there are definitive morphologic features of intraductal papilloma with associated fibrocystic change present. In addition, there are fibrocystic changes with usual ductal hyperplasia in representative sections not involved by papilloma. Finally, foci of sclerosing adenosis and microcalcifications in benign ducts and lobules are present. The surgical resection margin(s) of the specimen were inked and microscopically evaluated. 3. BREAST, INVASIVE TUMOR, WITH LYMPH NODE SAMPLING Specimen, including laterality: Right breast. Procedure: Simple mastectomy. Grade: III of III. Tubule formation: 3. Nuclear pleomorphism: 3. Mitotic: 2. Tumor size (gross measurement): 2.3 cm. Margins: Invasive, distance to closest margin: 1.5 cm. In-situ, distance to closest margin: 1.5 cm (deep). If margin positive, focally or broadly: N/A. Lymphovascular invasion: Absent. Ductal carcinoma in situ: Present. Grade: III of III. Extensive intraductal component: Absent. Lobular neoplasia: Absent. Tumor focality: Unifocal. Treatment effect: None. If present, treatment effect in breast tissue, lymph  nodes or both: N/A. Extent of tumor: Skin and Nipple: Grossly negative. Skeletal muscle: N/A. Lymph nodes: # examined: 1. Lymph nodes with metastasis: 0. Breast prognostic profile: Estrogen receptor: Repeated; previous study demonstrates 0% positivity ([id number]). Progesterone receptor: Repeated; previous study demonstrates 0% positivity ([id number]). HER-2/neu: Repeated; previous study demonstrated no amplification (1.55) ([id number]). 2 of 4 FINAL for NAHLA, LUKIN A [phone]) Microscopic Comment(continued) Ki-67: Not repeated; previous study demonstrates 34% proliferation rate [phone]). Non-neoplastic breast: Intraductal papilloma with fibrocystic change and usual ductal hyperplasia, fibrocystic change with usual ductal hyperplasia, microcalcifications, and previous biopsy site. TNM: pT2, pN0, pMX. Comments: Although there are definitive features of high grade invasive ductal carcinoma identified, there are multiple foci where the carcinoma assumes a spindle cell morphology / spindle cell differentiation. 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 There are no heterologous elements identified. (CRR:eps 09/15/12)  RADIOGRAPHIC STUDIES:  Chest 2 View  09/08/2012  *RADIOLOGY REPORT*  Clinical Data: Right-sided breast  CHEST - 2 VIEW  Comparison: Chest x-ray 12/06/2009.  Findings: Lung volumes are normal.  No consolidative airspace disease.  No pleural effusions.  No pneumothorax.  No pulmonary nodule or mass noted.  Pulmonary vasculature and the cardiomediastinal silhouette are within normal limits.  Mild bilateral apical pleuroparenchymal thickening most compatible with chronic scarring, unchanged compared to the prior examination.  IMPRESSION: 1. No radiographic evidence of acute cardiopulmonary disease.   Original Report Authenticated By: Trudie Reed, M.D.    Nm Sentinel Node Inj-no Rpt  (breast)  09/11/2012  CLINICAL DATA: Invasive right breast cancer   Sulfur colloid was injected intradermally by the nuclear medicine  technologist for breast cancer sentinel node localization.      ASSESSMENT: 57 year old female with  #1 invasive ductal carcinoma of the right breast status post bilateral mastectomies for a 2.3 cm node negative essentially triple negative disease on the right. Without any evidence of malignancy on the left. Patient's a candidate for adjuvant chemotherapy. We discussed Adriamycin Cytoxan dose dense followed by weekly Taxol Carbo. Since her tumor was 2% estrogen receptor positive we will utilize an aromatase inhibitor for 5 years. She understands the rationale for this.  #2 patient is also having considerable anxiety and will continue to take Effexor daily.    #3 she also began Prilosec 20 mg daily for reflux.  PLAN:   #1 Doing well. Ms. Gashi labs are stable.  She will proceed with  chemotherapy.  She will continue to take Super B complex daily.  She will use her moisturizer as needed.  Her WBC is mildly decreased, she will start Neupogen tomorrow and receive 3 doses of this. I ordered 1 L NS for her dehydration.   #2 She will return tomorrow, Wednesday, and Saturday for Neupogen, and in one week's time for labs and an appointment for evaluation, and chemotherapy.    All questions were answered. The patient knows to call the clinic with any problems, questions or concerns. We can certainly see the patient much sooner if necessary.  I spent 25 minutes counseling the patient face to face. The total time spent in the appointment was 30 minutes.  Cherie Ouch Lyn Hollingshead, NP Medical Oncology [facility] Phone: [phone] 01/13/2013, 9:46 AM

## 2013-01-13 NOTE — Patient Instructions (Addendum)
Filgrastim, G-CSF injection What is this medicine? FILGRASTIM, G-CSF (fil GRA stim) stimulates the formation of white blood cells. This medicine is given to patients with conditions that may cause a decrease in white blood cells, like those receiving certain types of chemotherapy or bone marrow transplant. It helps the bone marrow recover its ability to produce white blood cells. Increasing the amount of white blood cells helps to decrease the risk of infection and fever. This medicine may be used for other purposes; ask your health care provider or pharmacist if you have questions. What should I tell my health care provider before I take this medicine? They need to know if you have any of these conditions: -currently receiving radiation therapy -sickle cell disease -an unusual or allergic reaction to filgrastim, E. coli protein, other medicines, foods, dyes, or preservatives -pregnant or trying to get pregnant -breast-feeding How should I use this medicine? This medicine is for injection into a vein or injection under the skin. It is usually given by a health care professional in a hospital or clinic setting. If you get this medicine at home, you will be taught how to prepare and give this medicine. Always change the site for the injection under the skin. Let the solution warm to room temperature before you use it. Do not shake the solution before you withdraw a dose. Throw away any unused portion. Use exactly as directed. Take your medicine at regular intervals. Do not take your medicine more often than directed. It is important that you put your used needles and syringes in a special sharps container. Do not put them in a trash can. If you do not have a sharps container, call your pharmacist or healthcare provider to get one. Talk to your pediatrician regarding the use of this medicine in children. While this medicine may be prescribed for children for selected conditions, precautions do  apply. Overdosage: If you think you have taken too much of this medicine contact a poison control center or emergency room at once. NOTE: This medicine is only for you. Do not share this medicine with others. What if I miss a dose? Try not to miss doses. If you miss a dose take the dose as soon as you remember. If it is almost time for the next dose, do not take double doses unless told to by your doctor or health care professional. What may interact with this medicine? -lithium -medicines for cancer chemotherapy This list may not describe all possible interactions. Give your health care provider a list of all the medicines, herbs, non-prescription drugs, or dietary supplements you use. Also tell them if you smoke, drink alcohol, or use illegal drugs. Some items may interact with your medicine. What should I watch for while using this medicine? Visit your doctor or health care professional for regular checks on your progress. If you get a fever or any sign of infection while you are using this medicine, do not treat yourself. Check with your doctor or health care professional. Bone pain can usually be relieved by mild pain relievers such as acetaminophen or ibuprofen. Check with your doctor or health care professional before taking these medicines as they may hide a fever. Call your doctor or health care professional if the aches and pains are severe or do not go away. What side effects may I notice from receiving this medicine? Side effects that you should report to your doctor or health care professional as soon as possible: -allergic reactions like skin rash, itching  or hives, swelling of the face, lips, or tongue -difficulty breathing, wheezing -fever -pain, redness, or swelling at the injection site -stomach or side pain, or pain at the shoulder Side effects that usually do not require medical attention (report to your doctor or health care professional if they continue or are  bothersome): -bone pain (ribs, lower back, breast bone) -headache -skin rash This list may not describe all possible side effects. Call your doctor for medical advice about side effects. You may report side effects to FDA at 1-800-FDA-1088. Where should I keep my medicine? Keep out of the reach of children. Store in a refrigerator between 2 and 8 degrees C (36 and 46 degrees F). Do not freeze or leave in direct sunlight. If vials or syringes are left out of the refrigerator for more than 24 hours, they must be thrown away. Throw away unused vials after the expiration date on the carton. NOTE: This sheet is a summary. It may not cover all possible information. If you have questions about this medicine, talk to your doctor, pharmacist, or health care provider.  2013, [employer]. (09/17/2007 1:33:21 PM)

## 2013-01-13 NOTE — Telephone Encounter (Signed)
Per staff phone call and POF I have schedueld appts.  JMW  

## 2013-01-13 NOTE — Patient Instructions (Addendum)
[facility] Discharge Instructions for Patients Receiving Chemotherapy  Today you received the following chemotherapy agents:  Taxol and Carboplatin  To help prevent nausea and vomiting after your treatment, we encourage you to take your nausea medication as ordered per MD.   If you develop nausea and vomiting that is not controlled by your nausea medication, call the clinic.   BELOW ARE SYMPTOMS THAT SHOULD BE REPORTED IMMEDIATELY:  *FEVER GREATER THAN 100.5 F  *CHILLS WITH OR WITHOUT FEVER  NAUSEA AND VOMITING THAT IS NOT CONTROLLED WITH YOUR NAUSEA MEDICATION  *UNUSUAL SHORTNESS OF BREATH  *UNUSUAL BRUISING OR BLEEDING  TENDERNESS IN MOUTH AND THROAT WITH OR WITHOUT PRESENCE OF ULCERS  *URINARY PROBLEMS  *BOWEL PROBLEMS  UNUSUAL RASH Items with * indicate a potential emergency and should be followed up as soon as possible.  Feel free to call the clinic you have any questions or concerns. The clinic phone number is [phone].    

## 2013-01-13 NOTE — Telephone Encounter (Signed)
appts made and printed...td 

## 2013-01-13 NOTE — Progress Notes (Signed)
1038-Cath flo injected to PAC-no blood return from [facility].  1140-Positive blood return from [facility].

## 2013-01-14 ENCOUNTER — Ambulatory Visit (HOSPITAL_BASED_OUTPATIENT_CLINIC_OR_DEPARTMENT_OTHER): Payer: Private Health Insurance - Indemnity

## 2013-01-14 VITALS — BP 127/85 | HR 116 | Temp 98.7°F

## 2013-01-14 DIAGNOSIS — C50311 Malignant neoplasm of lower-inner quadrant of right female breast: Secondary | ICD-10-CM

## 2013-01-14 DIAGNOSIS — C50319 Malignant neoplasm of lower-inner quadrant of unspecified female breast: Secondary | ICD-10-CM

## 2013-01-14 DIAGNOSIS — Z5189 Encounter for other specified aftercare: Secondary | ICD-10-CM

## 2013-01-14 MED ORDER — FILGRASTIM 300 MCG/0.5ML IJ SOLN
300.0000 ug | Freq: Once | INTRAMUSCULAR | Status: AC
  Administered 2013-01-14: 300 ug via SUBCUTANEOUS
  Filled 2013-01-14: qty 0.5

## 2013-01-15 ENCOUNTER — Ambulatory Visit: Payer: Private Health Insurance - Indemnity

## 2013-01-15 ENCOUNTER — Telehealth: Payer: Self-pay | Admitting: *Deleted

## 2013-01-15 ENCOUNTER — Ambulatory Visit (HOSPITAL_BASED_OUTPATIENT_CLINIC_OR_DEPARTMENT_OTHER): Payer: Private Health Insurance - Indemnity

## 2013-01-15 VITALS — BP 119/63 | HR 114 | Temp 98.3°F

## 2013-01-15 DIAGNOSIS — C50319 Malignant neoplasm of lower-inner quadrant of unspecified female breast: Secondary | ICD-10-CM

## 2013-01-15 DIAGNOSIS — C50311 Malignant neoplasm of lower-inner quadrant of right female breast: Secondary | ICD-10-CM

## 2013-01-15 DIAGNOSIS — Z5189 Encounter for other specified aftercare: Secondary | ICD-10-CM

## 2013-01-15 MED ORDER — FILGRASTIM 300 MCG/0.5ML IJ SOLN
300.0000 ug | Freq: Once | INTRAMUSCULAR | Status: AC
  Administered 2013-01-15: 300 ug via SUBCUTANEOUS
  Filled 2013-01-15: qty 0.5

## 2013-01-15 NOTE — Telephone Encounter (Signed)
Pt called to request earlier injection appt.  Confirmed new appt time for 1100.  Called Janice injection nurse to inform of schedule change time.

## 2013-01-17 ENCOUNTER — Ambulatory Visit (HOSPITAL_BASED_OUTPATIENT_CLINIC_OR_DEPARTMENT_OTHER): Payer: Private Health Insurance - Indemnity

## 2013-01-17 VITALS — BP 113/79 | HR 98 | Temp 98.0°F

## 2013-01-17 DIAGNOSIS — Z5189 Encounter for other specified aftercare: Secondary | ICD-10-CM

## 2013-01-17 DIAGNOSIS — C50319 Malignant neoplasm of lower-inner quadrant of unspecified female breast: Secondary | ICD-10-CM

## 2013-01-17 DIAGNOSIS — C50311 Malignant neoplasm of lower-inner quadrant of right female breast: Secondary | ICD-10-CM

## 2013-01-17 MED ORDER — FILGRASTIM 300 MCG/0.5ML IJ SOLN
300.0000 ug | Freq: Once | INTRAMUSCULAR | Status: AC
  Administered 2013-01-17: 300 ug via SUBCUTANEOUS

## 2013-01-20 ENCOUNTER — Telehealth: Payer: Self-pay | Admitting: Oncology

## 2013-01-20 ENCOUNTER — Other Ambulatory Visit (HOSPITAL_BASED_OUTPATIENT_CLINIC_OR_DEPARTMENT_OTHER): Payer: Private Health Insurance - Indemnity | Admitting: Lab

## 2013-01-20 ENCOUNTER — Ambulatory Visit (HOSPITAL_BASED_OUTPATIENT_CLINIC_OR_DEPARTMENT_OTHER): Payer: Private Health Insurance - Indemnity | Admitting: Adult Health

## 2013-01-20 ENCOUNTER — Encounter: Payer: Self-pay | Admitting: Oncology

## 2013-01-20 ENCOUNTER — Encounter: Payer: Self-pay | Admitting: Adult Health

## 2013-01-20 ENCOUNTER — Ambulatory Visit (HOSPITAL_BASED_OUTPATIENT_CLINIC_OR_DEPARTMENT_OTHER): Payer: Private Health Insurance - Indemnity

## 2013-01-20 VITALS — BP 124/75 | HR 128 | Temp 98.4°F | Resp 20 | Ht 65.0 in | Wt 142.0 lb

## 2013-01-20 DIAGNOSIS — C50319 Malignant neoplasm of lower-inner quadrant of unspecified female breast: Secondary | ICD-10-CM

## 2013-01-20 DIAGNOSIS — Z171 Estrogen receptor negative status [ER-]: Secondary | ICD-10-CM

## 2013-01-20 DIAGNOSIS — C50311 Malignant neoplasm of lower-inner quadrant of right female breast: Secondary | ICD-10-CM

## 2013-01-20 DIAGNOSIS — F411 Generalized anxiety disorder: Secondary | ICD-10-CM

## 2013-01-20 DIAGNOSIS — Z5111 Encounter for antineoplastic chemotherapy: Secondary | ICD-10-CM

## 2013-01-20 DIAGNOSIS — D72819 Decreased white blood cell count, unspecified: Secondary | ICD-10-CM

## 2013-01-20 LAB — CBC WITH DIFFERENTIAL/PLATELET
BASO%: 0.7 % (ref 0.0–2.0)
Basophils Absolute: 0 10*3/uL (ref 0.0–0.1)
EOS%: 1 % (ref 0.0–7.0)
Eosinophils Absolute: 0 10*3/uL (ref 0.0–0.5)
HCT: 33 % — ABNORMAL LOW (ref 34.8–46.6)
HGB: 11.1 g/dL — ABNORMAL LOW (ref 11.6–15.9)
LYMPH%: 31.2 % (ref 14.0–49.7)
MCH: 30.6 pg (ref 25.1–34.0)
MCHC: 33.6 g/dL (ref 31.5–36.0)
MCV: 90.9 fL (ref 79.5–101.0)
MONO#: 0.7 10*3/uL (ref 0.1–0.9)
MONO%: 24.4 % — ABNORMAL HIGH (ref 0.0–14.0)
NEUT#: 1.3 10*3/uL — ABNORMAL LOW (ref 1.5–6.5)
NEUT%: 42.7 % (ref 38.4–76.8)
Platelets: 204 10*3/uL (ref 145–400)
RBC: 3.63 10*6/uL — ABNORMAL LOW (ref 3.70–5.45)
RDW: 23.9 % — ABNORMAL HIGH (ref 11.2–14.5)
WBC: 3 10*3/uL — ABNORMAL LOW (ref 3.9–10.3)
lymph#: 0.9 10*3/uL (ref 0.9–3.3)
nRBC: 0 % (ref 0–0)

## 2013-01-20 LAB — COMPREHENSIVE METABOLIC PANEL (CC13)
ALT: 73 U/L — ABNORMAL HIGH (ref 0–55)
AST: 40 U/L — ABNORMAL HIGH (ref 5–34)
Albumin: 3.5 g/dL (ref 3.5–5.0)
Alkaline Phosphatase: 141 U/L (ref 40–150)
BUN: 10.6 mg/dL (ref 7.0–26.0)
CO2: 28 mEq/L (ref 22–29)
Calcium: 9.3 mg/dL (ref 8.4–10.4)
Chloride: 105 mEq/L (ref 98–109)
Creatinine: 0.6 mg/dL (ref 0.6–1.1)
Glucose: 89 mg/dl (ref 70–140)
Potassium: 3.9 mEq/L (ref 3.5–5.1)
Sodium: 139 mEq/L (ref 136–145)
Total Bilirubin: 0.35 mg/dL (ref 0.20–1.20)
Total Protein: 6.6 g/dL (ref 6.4–8.3)

## 2013-01-20 MED ORDER — SODIUM CHLORIDE 0.9 % IV SOLN
80.0000 mg/m2 | Freq: Once | INTRAVENOUS | Status: AC
  Administered 2013-01-20: 132 mg via INTRAVENOUS
  Filled 2013-01-20: qty 22

## 2013-01-20 MED ORDER — SODIUM CHLORIDE 0.9 % IV SOLN
Freq: Once | INTRAVENOUS | Status: AC
  Administered 2013-01-20: 14:00:00 via INTRAVENOUS

## 2013-01-20 MED ORDER — FAMOTIDINE IN NACL 20-0.9 MG/50ML-% IV SOLN
20.0000 mg | Freq: Once | INTRAVENOUS | Status: AC
  Administered 2013-01-20: 20 mg via INTRAVENOUS

## 2013-01-20 MED ORDER — SODIUM CHLORIDE 0.9 % IV SOLN
248.4000 mg | Freq: Once | INTRAVENOUS | Status: AC
  Administered 2013-01-20: 250 mg via INTRAVENOUS
  Filled 2013-01-20: qty 25

## 2013-01-20 MED ORDER — DEXAMETHASONE SODIUM PHOSPHATE 20 MG/5ML IJ SOLN
20.0000 mg | Freq: Once | INTRAMUSCULAR | Status: AC
  Administered 2013-01-20: 20 mg via INTRAVENOUS

## 2013-01-20 MED ORDER — HEPARIN SOD (PORK) LOCK FLUSH 100 UNIT/ML IV SOLN
500.0000 [IU] | Freq: Once | INTRAVENOUS | Status: AC | PRN
  Administered 2013-01-20: 500 [IU]
  Filled 2013-01-20: qty 5

## 2013-01-20 MED ORDER — ONDANSETRON 16 MG/50ML IVPB (CHCC)
16.0000 mg | Freq: Once | INTRAVENOUS | Status: AC
  Administered 2013-01-20: 16 mg via INTRAVENOUS

## 2013-01-20 MED ORDER — SODIUM CHLORIDE 0.9 % IJ SOLN
10.0000 mL | INTRAMUSCULAR | Status: DC | PRN
  Administered 2013-01-20: 10 mL
  Filled 2013-01-20: qty 10

## 2013-01-20 MED ORDER — DIPHENHYDRAMINE HCL 50 MG/ML IJ SOLN
50.0000 mg | Freq: Once | INTRAMUSCULAR | Status: AC
  Administered 2013-01-20: 50 mg via INTRAVENOUS

## 2013-01-20 NOTE — Telephone Encounter (Signed)
, °

## 2013-01-20 NOTE — Patient Instructions (Addendum)
[facility] Discharge Instructions for Patients Receiving Chemotherapy  Today you received the following chemotherapy agents :  Taxol, Carboplatin.  To help prevent nausea and vomiting after your treatment, we encourage you to take your nausea medication as instructed by your physician.   If you develop nausea and vomiting that is not controlled by your nausea medication, call the clinic.   BELOW ARE SYMPTOMS THAT SHOULD BE REPORTED IMMEDIATELY:  *FEVER GREATER THAN 100.5 F  *CHILLS WITH OR WITHOUT FEVER  NAUSEA AND VOMITING THAT IS NOT CONTROLLED WITH YOUR NAUSEA MEDICATION  *UNUSUAL SHORTNESS OF BREATH  *UNUSUAL BRUISING OR BLEEDING  TENDERNESS IN MOUTH AND THROAT WITH OR WITHOUT PRESENCE OF ULCERS  *URINARY PROBLEMS  *BOWEL PROBLEMS  UNUSUAL RASH Items with * indicate a potential emergency and should be followed up as soon as possible.  Feel free to call the clinic you have any questions or concerns. The clinic phone number is [phone].    

## 2013-01-20 NOTE — Progress Notes (Signed)
OFFICE PROGRESS NOTE  CC  Nancy Mew, MD [address] [place] [address] Dr. Lurline Hare  Dr. Avel Peace  DIAGNOSIS: 57 year old female with new diagnosis of stage II a breast cancer that is triple negative    STAGE:  Cancer of lower-inner quadrant of female breast  Primary site: Breast (Right)  Staging method: AJCC 7th Edition  Clinical: Stage IIA (T2, N0, cM0)  Summary: Stage IIA (T2, N0, cM0)  PRIOR THERAPY: #1Patient's last mammogram was about 5 years ago. Recently she underwent a screening mammogram that showed a spiculated mass in the lower inner quadrant of the right breast. She had ultrasound performed that showed irregular mass at the 5:00 position 3 cm from the nipple measuring 1.1 cm. She had a needle core biopsy performed on 06/19/2012 the biopsy showed invasive ductal carcinoma with ductal carcinoma in situ grade 3 tumor was ER negative PR negative HER-2/neu negative. Ki-67 was elevated at 34%. She went on to have MRI of the breasts performed on 06/24/2012 the MRI showed in the middle third of the lower inner quadrant of the right breast a 1.5 x 1.3 x 1.9 cm irregular enhancing mass. No abnormal enhancement was seen in the left breast no enlarged axillary or internal mammary adenopathy was detected.   #2 patient is now status post bilateral mastectomies. On her right mastectomy she was found to have a 2.3 cm invasive ductal carcinoma 01 lymph nodes were positive for metastatic disease and this is T2 N0) stage II. Tumor was grade 3 ER 2% PR negative HER-2/neu negative with a Ki-67 at 34%. Left mastectomy only showed intraductal papilloma node atypia or malignancy was found.  #3 patient is now being considered for adjuvant chemotherapy since her tumor is essentially triple negative and is a stage II. We discussed the rationale for adjuvant chemotherapy. She will receive Adriamycin Cytoxan every 2 weeks for a total of 4 cycles with G-CSF support. Once  she completes this then she will receive Taxol and carboplatinum every week for a total of 12 weeks. I did discuss with her use of antiestrogen therapy such as an aromatase inhibitor since her tumor is 2% ER positive. I discussed the rationale for this.  CURRENT THERAPY: Taxol Carbo week 6  INTERVAL HISTORY: Nancy Beard 57 y.o. female returns for followup of her right breast cancer. She is doing very well today.  The numbness is stable.  She denies fevers, chills, nausea, vomiting, constipation, diarrhea, or any further concerns.  She denies pain or palpitations.  A 10 point ROS is otherwise negative.   MEDICAL HISTORY: Past Medical History  Diagnosis Date  . Thyroid disease   . Depression   . Sprue   . Diverticulosis   . Hyperlipidemia   . Breast cancer     right  . Hypothyroidism   . Pneumonia     ALLERGIES:  is allergic to ciprofloxacin and gluten meal.  MEDICATIONS:  Current Outpatient Prescriptions  Medication Sig Dispense Refill  . acetaminophen (TYLENOL) 325 MG tablet Take 650 mg by mouth as needed.      . B Complex-C (SUPER B COMPLEX PO) Take 1 tablet by mouth daily.      [name] dexamethasone (DECADRON) 4 MG tablet Take 2 tablets (8 mg total) by mouth 2 (two) times daily with a meal. Take two times a day starting the day after chemotherapy for 3 days.  30 tablet  1  . HYDROcodone-acetaminophen (NORCO/VICODIN) 5-325 MG per tablet Take 1 tablet by  mouth every 6 (six) hours as needed.  30 tablet  0  . levothyroxine (SYNTHROID, LEVOTHROID) 25 MCG tablet Take 25 mcg by mouth daily.      [name] lidocaine-prilocaine (EMLA) cream Apply topically as needed.  30 g  8  . liothyronine (CYTOMEL) 25 MCG tablet Take 12.5 mcg by mouth daily.      [name] LORazepam (ATIVAN) 0.5 MG tablet Take 1 tablet (0.5 mg total) by mouth every 6 (six) hours as needed (Nausea or vomiting).  30 tablet  0  . Multiple Vitamin (MULTIVITAMIN) tablet Take 1 tablet by mouth daily.      . ondansetron (ZOFRAN) 8 MG tablet  Take 1 tablet (8 mg total) by mouth 2 (two) times daily. Take two times a day starting the day after chemo for 3 days. Then take two times a day as needed for nausea or vomiting.  30 tablet  1  . prochlorperazine (COMPAZINE) 10 MG tablet Take 1 tablet (10 mg total) by mouth every 6 (six) hours as needed (Nausea or vomiting).  30 tablet  1  . prochlorperazine (COMPAZINE) 25 MG suppository Place 1 suppository (25 mg total) rectally every 12 (twelve) hours as needed for nausea.  12 suppository  3  . valACYclovir (VALTREX) 500 MG tablet Take 1 tablet (500 mg total) by mouth 2 (two) times daily.  60 tablet  6  . venlafaxine XR (EFFEXOR-XR) 75 MG 24 hr capsule Take 1 capsule (75 mg total) by mouth daily.  90 capsule  3   No current facility-administered medications for this visit.   Facility-Administered Medications Ordered in Other Visits  Medication Dose Route Frequency Provider Last Rate Last Dose  . CARBOplatin (PARAPLATIN) 250 mg in sodium chloride 0.9 % 100 mL chemo infusion  250 mg Intravenous Once Augustin Schooling, NP 250 mL/hr at 01/20/13 1601 250 mg at 01/20/13 1601  . heparin lock flush 100 unit/mL  500 Units Intracatheter Once PRN Victorino December, MD      . sodium chloride 0.9 % injection 10 mL  10 mL Intracatheter PRN Victorino December, MD        SURGICAL HISTORY:  Past Surgical History  Procedure Laterality Date  . Esophagogastroduodenoscopy  2007    sprue  . Colonoscopy  2006  . Cryoblation of cervix    . Breast lumpectomy    . Total mastectomy Bilateral 09/11/2012    Procedure: bilateral MASTECTOMY;  Surgeon: Adolph Pollack, MD;  Location: [facility] OR;  Service: General;  Laterality: Bilateral;  . Axillary sentinel node biopsy Right 09/11/2012    Procedure: AXILLARY SENTINEL lymph NODE  BIOPSY;  Surgeon: Adolph Pollack, MD;  Location: [facility] OR;  Service: General;  Laterality: Right;  right nuclear medicine injection 12:30   . Breast reconstruction with placement of tissue expander and  flex hd (acellular hydrated dermis) Bilateral 09/11/2012    Procedure: BREAST RECONSTRUCTION WITH PLACEMENT OF TISSUE EXPANDER AND FLEX HD (ACELLULAR HYDRATED DERMIS) ADM;  Surgeon: Wayland Denis, DO;  Location: [facility] OR;  Service: Plastics;  Laterality: Bilateral;  . Incision and drainage of wound Left 09/16/2012    Procedure: Left Breast Evacuation of Hematoma;  Surgeon: Wayland Denis, DO;  Location: [facility] OR;  Service: Plastics;  Laterality: Left;  . Portacath placement Right 10/09/2012    Procedure: US GUIDED INSERTION PORT-A-CATH;  Surgeon: Adolph Pollack, MD;  Location: [facility];  Service: General;  Laterality: Right;  Right Subclavian Vein    REVIEW OF SYSTEMS:  General: fatigue (+), night sweats (-), fever (-), pain (-) Lymph: palpable nodes (-) HEENT: vision changes (-), mucositis (-), gum bleeding (-), epistaxis (-) Cardiovascular: chest pain (-), palpitations (-) Pulmonary: shortness of breath (-), dyspnea on exertion (-), cough (-), hemoptysis (-) GI:  Early satiety (-), melena (-), dysphagia (-), nausea/vomiting (-), diarrhea (-) GU: dysuria (-), hematuria (-), incontinence (-) Musculoskeletal: joint swelling (-), joint pain (-), back pain (-) Neuro: weakness (-), numbness (+), headache (-), confusion (-) Skin: Rash (-), lesions (-), dryness (-) Psych: depression (-), suicidal/homicidal ideation (-), feeling of hopelessness (-)   PHYSICAL EXAMINATION: Blood pressure 124/75, pulse 128, temperature 98.4 F (36.9 C), temperature source Oral, resp. rate 20, height 5\' 5"  (1.651 m), weight 142 lb (64.411 kg), last menstrual period 09/11/2012. Body mass index is 23.63 kg/(m^2). General: Patient is a well appearing female in no acute distress HEENT: PERRLA, sclerae anicteric no conjunctival pallor, MMM Neck: supple, no palpable adenopathy Lungs: clear to auscultation bilaterally, no wheezes, rhonchi, or rales Cardiovascular: regular rate rhythm, S1, S2, no murmurs, rubs  or gallops Abdomen: Soft, non-tender, non-distended, normoactive bowel sounds, no HSM Extremities: warm and well perfused, no clubbing, cyanosis, or edema Skin: No rashes or lesions, scalp erythema appears to be a birthmark Neuro: Non-focal Breasts: bilateral mastectomy sites are well healed, no nodularity, masses, or skin changes, expanders in place. ECOG PERFORMANCE STATUS: 1 - Symptomatic but completely ambulatory  LABORATORY DATA: Lab Results  Component Value Date   WBC 3.0* 01/20/2013   HGB 11.1* 01/20/2013   HCT 33.0* 01/20/2013   MCV 90.9 01/20/2013   PLT 204 01/20/2013      Chemistry      Component Value Date/Time   NA 139 01/20/2013 1216   NA 138 09/15/2012 2035   K 3.9 01/20/2013 1216   K 3.6 09/15/2012 2035   CL 105 01/06/2013 0827   CL 103 09/15/2012 2035   CO2 28 01/20/2013 1216   CO2 26 09/15/2012 2035   BUN 10.6 01/20/2013 1216   BUN 4* 09/15/2012 2035   CREATININE 0.6 01/20/2013 1216   CREATININE 0.54 09/15/2012 2035      Component Value Date/Time   CALCIUM 9.3 01/20/2013 1216   CALCIUM 9.0 09/15/2012 2035   ALKPHOS 141 01/20/2013 1216   ALKPHOS 142* 09/15/2012 2035   AST 40* 01/20/2013 1216   AST 32 09/15/2012 2035   ALT 73* 01/20/2013 1216   ALT 24 09/15/2012 2035   BILITOT 0.35 01/20/2013 1216   BILITOT 0.8 09/15/2012 2035     ADDITIONAL INFORMATION: 3. CHROMOGENIC IN-SITU HYBRIDIZATION Interpretation HER-2/NEU BY CISH - NO AMPLIFICATION OF HER-2 DETECTED. THE RATIO OF HER-2: CEP 17 SIGNALS WAS 1.52. Reference range: Ratio: HER2:CEP17 < 1.8 - gene amplification not observed Ratio: HER2:CEP 17 1.8-2.2 - equivocal result Ratio: HER2:CEP17 > 2.2 - gene amplification observed Pecola Leisure MD Pathologist, Electronic Signature ( Signed 09/22/2012) 3. PROGNOSTIC INDICATORS - ACIS Results IMMUNOHISTOCHEMICAL AND MORPHOMETRIC ANALYSIS BY THE AUTOMATED CELLULAR IMAGING SYSTEM (ACIS) Estrogen Receptor (Negative, <1%): 2%, POSITIVE, WEAK STAINING INTENSITY Progesterone Receptor (Negative, <1%): 0%,  NEGATIVE COMMENT: The negative hormone receptor study in this case has an internal positive control. All controls stained appropriately Pecola Leisure MD Pathologist, Electronic Signature ( Signed 09/22/2012) 1 of 4 FINAL for Bezdek, Jenilee A [phone]) FINAL DIAGNOSIS Diagnosis 1. Lymph node, sentinel, biopsy, Right axilla - ONE LYMPH NODE, NEGATIVE FOR TUMOR (0/1). 2. Breast, simple mastectomy, Left - BENIGN BREAST TISSUE WITH INTRADUCTAL PAPILLOMA, SEE COMMENT. - NEGATIVE FOR  ATYPIA OR MALIGNANCY. - SURGICAL MARGINS, NEGATIVE FOR ATYPIA OR MALIGNANCY. - MICROCALCIFICATIONS IDENTIFIED. 3. Breast, simple mastectomy, Right - INVASIVE DUCTAL CARCINOMA, GRADE III, WITH SPINDLE CELL DIFFERENTIATION (METAPLASTIC CARCINOMA) (2.3CM), SEE COMMENT. - NO LYMPHOVASCULAR INVASION IDENTIFIED. - INVASIVE TUMOR IS 1.5 CM FROM NEAREST MARGIN (DEEP). - DUCTAL CARCINOMA IN SITU, GRADE III, WITH COMEDONECROSIS. - SEE TUMOR SYNOPTIC TEMPLATE BELOW. Microscopic Comment 2. Although there was no mass grossly identified, there are definitive morphologic features of intraductal papilloma with associated fibrocystic change present. In addition, there are fibrocystic changes with usual ductal hyperplasia in representative sections not involved by papilloma. Finally, foci of sclerosing adenosis and microcalcifications in benign ducts and lobules are present. The surgical resection margin(s) of the specimen were inked and microscopically evaluated. 3. BREAST, INVASIVE TUMOR, WITH LYMPH NODE SAMPLING Specimen, including laterality: Right breast. Procedure: Simple mastectomy. Grade: III of III. Tubule formation: 3. Nuclear pleomorphism: 3. Mitotic: 2. Tumor size (gross measurement): 2.3 cm. Margins: Invasive, distance to closest margin: 1.5 cm. In-situ, distance to closest margin: 1.5 cm (deep). If margin positive, focally or broadly: N/A. Lymphovascular invasion: Absent. Ductal carcinoma in situ:  Present. Grade: III of III. Extensive intraductal component: Absent. Lobular neoplasia: Absent. Tumor focality: Unifocal. Treatment effect: None. If present, treatment effect in breast tissue, lymph nodes or both: N/A. Extent of tumor: Skin and Nipple: Grossly negative. Skeletal muscle: N/A. Lymph nodes: # examined: 1. Lymph nodes with metastasis: 0. Breast prognostic profile: Estrogen receptor: Repeated; previous study demonstrates 0% positivity ([id number]). Progesterone receptor: Repeated; previous study demonstrates 0% positivity ([id number]). HER-2/neu: Repeated; previous study demonstrated no amplification (1.55) ([id number]). 2 of 4 FINAL for AYAKO, TAPANES A [phone]) Microscopic Comment(continued) Ki-67: Not repeated; previous study demonstrates 34% proliferation rate [phone]). Non-neoplastic breast: Intraductal papilloma with fibrocystic change and usual ductal hyperplasia, fibrocystic change with usual ductal hyperplasia, microcalcifications, and previous biopsy site. TNM: pT2, pN0, pMX. Comments: Although there are definitive features of high grade invasive ductal carcinoma identified, there are multiple foci where the carcinoma assumes a spindle cell morphology / spindle cell differentiation. 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 There are no heterologous elements identified. (CRR:eps 09/15/12)  RADIOGRAPHIC STUDIES:  Chest 2 View  09/08/2012  *RADIOLOGY REPORT*  Clinical Data: Right-sided breast  CHEST - 2 VIEW  Comparison: Chest x-ray 12/06/2009.  Findings: Lung volumes are normal.  No consolidative airspace disease.  No pleural effusions.  No pneumothorax.  No pulmonary nodule or mass noted.  Pulmonary vasculature and the cardiomediastinal silhouette are within normal limits.  Mild bilateral apical pleuroparenchymal thickening most compatible with chronic scarring, unchanged  compared to the prior examination.  IMPRESSION: 1. No radiographic evidence of acute cardiopulmonary disease.   Original Report Authenticated By: Trudie Reed, M.D.    Nm Sentinel Node Inj-no Rpt (breast)  09/11/2012  CLINICAL DATA: Invasive right breast cancer   Sulfur colloid was injected intradermally by the nuclear medicine  technologist for breast cancer sentinel node localization.      ASSESSMENT: 57 year old female with  #1 invasive ductal carcinoma of the right breast status post bilateral mastectomies for a 2.3 cm node negative essentially triple negative disease on the right. Without any evidence of malignancy on the left. Patient's a candidate for adjuvant chemotherapy. We discussed Adriamycin Cytoxan dose dense followed by weekly Taxol Carbo. Since her tumor was 2% estrogen receptor positive we will utilize an aromatase inhibitor for 5 years. She understands the rationale for this.  #  2 patient is also having considerable anxiety and will continue to take Effexor daily.    #3 she also began Prilosec 20 mg daily for reflux.  PLAN:   #1 Doing well. Ms. Dershem labs are stable.  She will proceed with chemotherapy.  She will continue to take Super B complex daily.  Her WBC is again decreased.  She will start Neupogen tomorrow at x 4 days.    #2 She will return for the next four days for Neupogen, and next week for her next cycle of chemotherapy.    All questions were answered. The patient knows to call the clinic with any problems, questions or concerns. We can certainly see the patient much sooner if necessary.  I spent 25 minutes counseling the patient face to face. The total time spent in the appointment was 30 minutes.  Cherie Ouch Lyn Hollingshead, NP Medical Oncology [facility] Phone: [phone] 01/20/2013, 4:05 PM

## 2013-01-20 NOTE — Patient Instructions (Addendum)
Doing well.  Proceed with chemotherapy today.  You will require 4 days of Neupogen starting tomorrow.  Please call us if you have any questions or concerns.    We will see you back next week.

## 2013-01-21 ENCOUNTER — Ambulatory Visit (HOSPITAL_BASED_OUTPATIENT_CLINIC_OR_DEPARTMENT_OTHER): Payer: Private Health Insurance - Indemnity

## 2013-01-21 VITALS — BP 129/82 | HR 105 | Temp 98.5°F

## 2013-01-21 DIAGNOSIS — Z5189 Encounter for other specified aftercare: Secondary | ICD-10-CM

## 2013-01-21 DIAGNOSIS — C50311 Malignant neoplasm of lower-inner quadrant of right female breast: Secondary | ICD-10-CM

## 2013-01-21 DIAGNOSIS — C50319 Malignant neoplasm of lower-inner quadrant of unspecified female breast: Secondary | ICD-10-CM

## 2013-01-21 MED ORDER — FILGRASTIM 480 MCG/0.8ML IJ SOLN
480.0000 ug | Freq: Once | INTRAMUSCULAR | Status: AC
  Administered 2013-01-21: 480 ug via SUBCUTANEOUS
  Filled 2013-01-21: qty 0.8

## 2013-01-22 ENCOUNTER — Ambulatory Visit (HOSPITAL_BASED_OUTPATIENT_CLINIC_OR_DEPARTMENT_OTHER): Payer: Private Health Insurance - Indemnity

## 2013-01-22 VITALS — BP 121/91 | HR 104 | Temp 98.8°F

## 2013-01-22 DIAGNOSIS — C50311 Malignant neoplasm of lower-inner quadrant of right female breast: Secondary | ICD-10-CM

## 2013-01-22 DIAGNOSIS — Z5189 Encounter for other specified aftercare: Secondary | ICD-10-CM

## 2013-01-22 DIAGNOSIS — C50319 Malignant neoplasm of lower-inner quadrant of unspecified female breast: Secondary | ICD-10-CM

## 2013-01-22 MED ORDER — FILGRASTIM 480 MCG/0.8ML IJ SOLN
480.0000 ug | Freq: Once | INTRAMUSCULAR | Status: AC
  Administered 2013-01-22: 480 ug via SUBCUTANEOUS
  Filled 2013-01-22: qty 0.8

## 2013-01-23 ENCOUNTER — Ambulatory Visit (HOSPITAL_BASED_OUTPATIENT_CLINIC_OR_DEPARTMENT_OTHER): Payer: Private Health Insurance - Indemnity

## 2013-01-23 ENCOUNTER — Other Ambulatory Visit: Payer: Self-pay | Admitting: Oncology

## 2013-01-23 VITALS — BP 123/81 | HR 108 | Temp 98.1°F

## 2013-01-23 DIAGNOSIS — Z5189 Encounter for other specified aftercare: Secondary | ICD-10-CM

## 2013-01-23 DIAGNOSIS — C50311 Malignant neoplasm of lower-inner quadrant of right female breast: Secondary | ICD-10-CM

## 2013-01-23 DIAGNOSIS — C50319 Malignant neoplasm of lower-inner quadrant of unspecified female breast: Secondary | ICD-10-CM

## 2013-01-23 MED ORDER — FILGRASTIM 480 MCG/0.8ML IJ SOLN
480.0000 ug | Freq: Once | INTRAMUSCULAR | Status: AC
  Administered 2013-01-23: 480 ug via SUBCUTANEOUS
  Filled 2013-01-23: qty 0.8

## 2013-01-24 ENCOUNTER — Ambulatory Visit (HOSPITAL_BASED_OUTPATIENT_CLINIC_OR_DEPARTMENT_OTHER): Payer: Private Health Insurance - Indemnity

## 2013-01-24 VITALS — BP 116/70 | HR 81 | Temp 97.6°F

## 2013-01-24 DIAGNOSIS — Z5189 Encounter for other specified aftercare: Secondary | ICD-10-CM

## 2013-01-24 DIAGNOSIS — C50319 Malignant neoplasm of lower-inner quadrant of unspecified female breast: Secondary | ICD-10-CM

## 2013-01-24 MED ORDER — FILGRASTIM 480 MCG/0.8ML IJ SOLN
480.0000 ug | Freq: Once | INTRAMUSCULAR | Status: AC
  Administered 2013-01-24: 480 ug via SUBCUTANEOUS

## 2013-01-24 NOTE — Patient Instructions (Signed)
Filgrastim, G-CSF injection What is this medicine? FILGRASTIM, G-CSF (fil GRA stim) stimulates the formation of white blood cells. This medicine is given to patients with conditions that may cause a decrease in white blood cells, like those receiving certain types of chemotherapy or bone marrow transplant. It helps the bone marrow recover its ability to produce white blood cells. Increasing the amount of white blood cells helps to decrease the risk of infection and fever. This medicine may be used for other purposes; ask your health care provider or pharmacist if you have questions. What should I tell my health care provider before I take this medicine? They need to know if you have any of these conditions: -currently receiving radiation therapy -sickle cell disease -an unusual or allergic reaction to filgrastim, E. coli protein, other medicines, foods, dyes, or preservatives -pregnant or trying to get pregnant -breast-feeding How should I use this medicine? This medicine is for injection into a vein or injection under the skin. It is usually given by a health care professional in a hospital or clinic setting. If you get this medicine at home, you will be taught how to prepare and give this medicine. Always change the site for the injection under the skin. Let the solution warm to room temperature before you use it. Do not shake the solution before you withdraw a dose. Throw away any unused portion. Use exactly as directed. Take your medicine at regular intervals. Do not take your medicine more often than directed. It is important that you put your used needles and syringes in a special sharps container. Do not put them in a trash can. If you do not have a sharps container, call your pharmacist or healthcare provider to get one. Talk to your pediatrician regarding the use of this medicine in children. While this medicine may be prescribed for children for selected conditions, precautions do  apply. Overdosage: If you think you have taken too much of this medicine contact a poison control center or emergency room at once. NOTE: This medicine is only for you. Do not share this medicine with others. What if I miss a dose? Try not to miss doses. If you miss a dose take the dose as soon as you remember. If it is almost time for the next dose, do not take double doses unless told to by your doctor or health care professional. What may interact with this medicine? -lithium -medicines for cancer chemotherapy This list may not describe all possible interactions. Give your health care provider a list of all the medicines, herbs, non-prescription drugs, or dietary supplements you use. Also tell them if you smoke, drink alcohol, or use illegal drugs. Some items may interact with your medicine. What should I watch for while using this medicine? Visit your doctor or health care professional for regular checks on your progress. If you get a fever or any sign of infection while you are using this medicine, do not treat yourself. Check with your doctor or health care professional. Bone pain can usually be relieved by mild pain relievers such as acetaminophen or ibuprofen. Check with your doctor or health care professional before taking these medicines as they may hide a fever. Call your doctor or health care professional if the aches and pains are severe or do not go away. What side effects may I notice from receiving this medicine? Side effects that you should report to your doctor or health care professional as soon as possible: -allergic reactions like skin rash, itching   or hives, swelling of the face, lips, or tongue -difficulty breathing, wheezing -fever -pain, redness, or swelling at the injection site -stomach or side pain, or pain at the shoulder Side effects that usually do not require medical attention (report to your doctor or health care professional if they continue or are  bothersome): -bone pain (ribs, lower back, breast bone) -headache -skin rash This list may not describe all possible side effects. Call your doctor for medical advice about side effects. You may report side effects to FDA at 1-800-FDA-1088. Where should I keep my medicine? Keep out of the reach of children. Store in a refrigerator between 2 and 8 degrees C (36 and 46 degrees F). Do not freeze or leave in direct sunlight. If vials or syringes are left out of the refrigerator for more than 24 hours, they must be thrown away. Throw away unused vials after the expiration date on the carton. NOTE: This sheet is a summary. It may not cover all possible information. If you have questions about this medicine, talk to your doctor, pharmacist, or health care provider.  2013, [employer]. (09/17/2007 1:33:21 PM)  

## 2013-01-27 ENCOUNTER — Ambulatory Visit (HOSPITAL_BASED_OUTPATIENT_CLINIC_OR_DEPARTMENT_OTHER): Payer: Private Health Insurance - Indemnity

## 2013-01-27 ENCOUNTER — Telehealth: Payer: Self-pay | Admitting: *Deleted

## 2013-01-27 ENCOUNTER — Encounter: Payer: Self-pay | Admitting: Oncology

## 2013-01-27 ENCOUNTER — Other Ambulatory Visit (HOSPITAL_BASED_OUTPATIENT_CLINIC_OR_DEPARTMENT_OTHER): Payer: Private Health Insurance - Indemnity

## 2013-01-27 ENCOUNTER — Ambulatory Visit (HOSPITAL_BASED_OUTPATIENT_CLINIC_OR_DEPARTMENT_OTHER): Payer: Private Health Insurance - Indemnity | Admitting: Oncology

## 2013-01-27 VITALS — BP 118/77 | HR 108 | Temp 97.8°F | Resp 18 | Ht 65.0 in | Wt 144.1 lb

## 2013-01-27 DIAGNOSIS — D72819 Decreased white blood cell count, unspecified: Secondary | ICD-10-CM

## 2013-01-27 DIAGNOSIS — C50311 Malignant neoplasm of lower-inner quadrant of right female breast: Secondary | ICD-10-CM

## 2013-01-27 DIAGNOSIS — F411 Generalized anxiety disorder: Secondary | ICD-10-CM

## 2013-01-27 DIAGNOSIS — C50319 Malignant neoplasm of lower-inner quadrant of unspecified female breast: Secondary | ICD-10-CM

## 2013-01-27 DIAGNOSIS — Z5111 Encounter for antineoplastic chemotherapy: Secondary | ICD-10-CM

## 2013-01-27 LAB — CBC WITH DIFFERENTIAL/PLATELET
BASO%: 0.7 % (ref 0.0–2.0)
Basophils Absolute: 0 10*3/uL (ref 0.0–0.1)
EOS%: 0.6 % (ref 0.0–7.0)
Eosinophils Absolute: 0 10*3/uL (ref 0.0–0.5)
HCT: 31.3 % — ABNORMAL LOW (ref 34.8–46.6)
HGB: 10.9 g/dL — ABNORMAL LOW (ref 11.6–15.9)
LYMPH%: 32.4 % (ref 14.0–49.7)
MCH: 33.1 pg (ref 25.1–34.0)
MCHC: 34.8 g/dL (ref 31.5–36.0)
MCV: 95.2 fL (ref 79.5–101.0)
MONO#: 0.5 10*3/uL (ref 0.1–0.9)
MONO%: 19.7 % — ABNORMAL HIGH (ref 0.0–14.0)
NEUT#: 1.3 10*3/uL — ABNORMAL LOW (ref 1.5–6.5)
NEUT%: 46.6 % (ref 38.4–76.8)
Platelets: 170 10*3/uL (ref 145–400)
RBC: 3.29 10*6/uL — ABNORMAL LOW (ref 3.70–5.45)
RDW: 23.8 % — ABNORMAL HIGH (ref 11.2–14.5)
WBC: 2.7 10*3/uL — ABNORMAL LOW (ref 3.9–10.3)
lymph#: 0.9 10*3/uL (ref 0.9–3.3)

## 2013-01-27 LAB — COMPREHENSIVE METABOLIC PANEL (CC13)
ALT: 96 U/L — ABNORMAL HIGH (ref 0–55)
AST: 57 U/L — ABNORMAL HIGH (ref 5–34)
Albumin: 3.4 g/dL — ABNORMAL LOW (ref 3.5–5.0)
Alkaline Phosphatase: 189 U/L — ABNORMAL HIGH (ref 40–150)
BUN: 9 mg/dL (ref 7.0–26.0)
CO2: 24 mEq/L (ref 22–29)
Calcium: 9.3 mg/dL (ref 8.4–10.4)
Chloride: 106 mEq/L (ref 98–109)
Creatinine: 0.7 mg/dL (ref 0.6–1.1)
Glucose: 113 mg/dl (ref 70–140)
Potassium: 3.8 mEq/L (ref 3.5–5.1)
Sodium: 139 mEq/L (ref 136–145)
Total Bilirubin: 0.36 mg/dL (ref 0.20–1.20)
Total Protein: 6.5 g/dL (ref 6.4–8.3)

## 2013-01-27 MED ORDER — DIPHENHYDRAMINE HCL 50 MG/ML IJ SOLN
50.0000 mg | Freq: Once | INTRAMUSCULAR | Status: AC
  Administered 2013-01-27: 50 mg via INTRAVENOUS

## 2013-01-27 MED ORDER — HEPARIN SOD (PORK) LOCK FLUSH 100 UNIT/ML IV SOLN
500.0000 [IU] | Freq: Once | INTRAVENOUS | Status: AC | PRN
  Administered 2013-01-27: 500 [IU]
  Filled 2013-01-27: qty 5

## 2013-01-27 MED ORDER — DEXAMETHASONE SODIUM PHOSPHATE 20 MG/5ML IJ SOLN
20.0000 mg | Freq: Once | INTRAMUSCULAR | Status: AC
  Administered 2013-01-27: 20 mg via INTRAVENOUS

## 2013-01-27 MED ORDER — SODIUM CHLORIDE 0.9 % IJ SOLN
10.0000 mL | INTRAMUSCULAR | Status: DC | PRN
  Administered 2013-01-27: 10 mL
  Filled 2013-01-27: qty 10

## 2013-01-27 MED ORDER — SODIUM CHLORIDE 0.9 % IV SOLN
248.4000 mg | Freq: Once | INTRAVENOUS | Status: AC
  Administered 2013-01-27: 250 mg via INTRAVENOUS
  Filled 2013-01-27: qty 25

## 2013-01-27 MED ORDER — ONDANSETRON 16 MG/50ML IVPB (CHCC)
16.0000 mg | Freq: Once | INTRAVENOUS | Status: AC
  Administered 2013-01-27: 16 mg via INTRAVENOUS

## 2013-01-27 MED ORDER — RANITIDINE HCL 50 MG/2ML IJ SOLN
50.0000 mg | Freq: Once | INTRAVENOUS | Status: AC
  Administered 2013-01-27: 50 mg via INTRAVENOUS
  Filled 2013-01-27: qty 2

## 2013-01-27 MED ORDER — FAMOTIDINE IN NACL 20-0.9 MG/50ML-% IV SOLN: 20.0000 mg | Freq: Once | INTRAVENOUS | Status: DC

## 2013-01-27 MED ORDER — SODIUM CHLORIDE 0.9 % IV SOLN
80.0000 mg/m2 | Freq: Once | INTRAVENOUS | Status: AC
  Administered 2013-01-27: 132 mg via INTRAVENOUS
  Filled 2013-01-27: qty 22

## 2013-01-27 MED ORDER — SODIUM CHLORIDE 0.9 % IV SOLN
Freq: Once | INTRAVENOUS | Status: AC
  Administered 2013-01-27: 10:00:00 via INTRAVENOUS

## 2013-01-27 NOTE — Patient Instructions (Addendum)
Proceed with chemotherapy today  neupogen daily x 4 days begin on 7/16

## 2013-01-27 NOTE — Patient Instructions (Addendum)
[facility] Discharge Instructions for Patients Receiving Chemotherapy  Today you received the following chemotherapy agents Carboplatin and Taxol.  To help prevent nausea and vomiting after your treatment, we encourage you to take your nausea medication.   If you develop nausea and vomiting that is not controlled by your nausea medication, call the clinic.   BELOW ARE SYMPTOMS THAT SHOULD BE REPORTED IMMEDIATELY:  *FEVER GREATER THAN 100.5 F  *CHILLS WITH OR WITHOUT FEVER  NAUSEA AND VOMITING THAT IS NOT CONTROLLED WITH YOUR NAUSEA MEDICATION  *UNUSUAL SHORTNESS OF BREATH  *UNUSUAL BRUISING OR BLEEDING  TENDERNESS IN MOUTH AND THROAT WITH OR WITHOUT PRESENCE OF ULCERS  *URINARY PROBLEMS  *BOWEL PROBLEMS  UNUSUAL RASH Items with * indicate a potential emergency and should be followed up as soon as possible.  Feel free to call the clinic you have any questions or concerns. The clinic phone number is [phone].

## 2013-01-27 NOTE — Telephone Encounter (Signed)
appts made and printed...td 

## 2013-01-27 NOTE — Progress Notes (Signed)
OFFICE PROGRESS NOTE  CC  Nancy Mew, MD [address] [place] [address] Dr. Lurline Hare  Dr. 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 Recently she underwent Beard screening mammogram that showed Beard spiculated mass in the lower inner quadrant of the right breast. She had ultrasound performed that showed irregular mass at the 5:00 position 3 cm from the nipple measuring 1.1 cm. She had Beard needle core biopsy performed on 06/19/2012 the biopsy showed invasive ductal carcinoma with ductal carcinoma in situ grade 3 tumor was ER negative PR negative HER-2/neu negative. Ki-67 was elevated at 34%. She went on to have MRI of the breasts performed on 06/24/2012 the MRI showed in the middle third of the lower inner quadrant of the right breast Beard 1.5 x 1.3 x 1.9 cm irregular enhancing mass. No abnormal enhancement was seen in the left breast no enlarged axillary or internal mammary adenopathy was detected.   #2 patient is now status post bilateral mastectomies. On her right mastectomy she was found to have Beard 2.3 cm invasive ductal carcinoma 01 lymph nodes were positive for metastatic disease and this is T2 N0) stage II. Tumor was grade 3 ER 2% PR negative HER-2/neu negative with Beard Ki-67 at 34%. Left mastectomy only showed intraductal papilloma node atypia or malignancy was found.  #3 patient is now being considered for adjuvant chemotherapy since her tumor is essentially triple negative and is Beard stage II. We discussed the rationale for adjuvant chemotherapy. She will receive Adriamycin Cytoxan every 2 weeks for Beard total of 4 cycles with G-CSF support. Once  she completes this then she will receive Taxol and carboplatinum every week for Beard total of 12 weeks. I did discuss with her use of antiestrogen therapy such as an aromatase inhibitor since her tumor is 2% ER positive. I discussed the rationale for this.  CURRENT THERAPY: Taxol Carbo week 7  INTERVAL HISTORY: Nancy Beard 57 y.o. female returns for followup of her right breast cancer. She is doing very well today.  The numbness is stable.  She denies fevers, chills, nausea, vomiting, constipation, diarrhea, or any further concerns.  She denies pain or palpitations.  Beard 10 point ROS is otherwise negative.   MEDICAL HISTORY: Past Medical History  Diagnosis Date  . Thyroid disease   . Depression   . Sprue   . Diverticulosis   . Hyperlipidemia   . Breast cancer     right  . Hypothyroidism   . Pneumonia     ALLERGIES:  is allergic to ciprofloxacin and gluten meal.  MEDICATIONS:  Current Outpatient Prescriptions  Medication Sig Dispense Refill  . acetaminophen (TYLENOL) 325 MG tablet Take 650 mg by mouth as needed.      . B Complex-C (SUPER B COMPLEX PO) Take 1 tablet by mouth daily.      [name] levothyroxine (SYNTHROID, LEVOTHROID) 25 MCG tablet Take 25 mcg by mouth daily.      [name] lidocaine-prilocaine (EMLA) cream Apply topically as needed.  30 g  8  . liothyronine (CYTOMEL) 25 MCG tablet Take 12.5 mcg by mouth daily.      . Multiple  Vitamin (MULTIVITAMIN) tablet Take 1 tablet by mouth daily.      . ondansetron (ZOFRAN) 8 MG tablet Take 1 tablet (8 mg total) by mouth 2 (two) times daily. Take two times Beard day starting the day after chemo for 3 days. Then take two times Beard day as needed for nausea or vomiting.  30 tablet  1  . prochlorperazine (COMPAZINE) 10 MG tablet Take 1 tablet (10 mg total) by mouth every 6 (six) hours as needed (Nausea or vomiting).  30 tablet  1  . valACYclovir (VALTREX) 500 MG tablet Take 1 tablet (500 mg total) by mouth 2 (two) times daily.  60 tablet  6  .  venlafaxine XR (EFFEXOR-XR) 75 MG 24 hr capsule Take 1 capsule (75 mg total) by mouth daily.  90 capsule  3  . dexamethasone (DECADRON) 4 MG tablet Take 2 tablets (8 mg total) by mouth 2 (two) times daily with Beard meal. Take two times Beard day starting the day after chemotherapy for 3 days.  30 tablet  1  . HYDROcodone-acetaminophen (NORCO/VICODIN) 5-325 MG per tablet Take 1 tablet by mouth every 6 (six) hours as needed.  30 tablet  0  . LORazepam (ATIVAN) 0.5 MG tablet Take 1 tablet (0.5 mg total) by mouth every 6 (six) hours as needed (Nausea or vomiting).  30 tablet  0  . prochlorperazine (COMPAZINE) 25 MG suppository Place 1 suppository (25 mg total) rectally every 12 (twelve) hours as needed for nausea.  12 suppository  3   No current facility-administered medications for this visit.    SURGICAL HISTORY:  Past Surgical History  Procedure Laterality Date  . Esophagogastroduodenoscopy  2007    sprue  . Colonoscopy  2006  . Cryoblation of cervix    . Breast lumpectomy    . Total mastectomy Bilateral 09/11/2012    Procedure: bilateral MASTECTOMY;  Surgeon: Adolph Pollack, MD;  Location: [facility] OR;  Service: General;  Laterality: Bilateral;  . Axillary sentinel node biopsy Right 09/11/2012    Procedure: AXILLARY SENTINEL lymph NODE  BIOPSY;  Surgeon: Adolph Pollack, MD;  Location: [facility] OR;  Service: General;  Laterality: Right;  right nuclear medicine injection 12:30   . Breast reconstruction with placement of tissue expander and flex hd (acellular hydrated dermis) Bilateral 09/11/2012    Procedure: BREAST RECONSTRUCTION WITH PLACEMENT OF TISSUE EXPANDER AND FLEX HD (ACELLULAR HYDRATED DERMIS) ADM;  Surgeon: Wayland Denis, DO;  Location: [facility] OR;  Service: Plastics;  Laterality: Bilateral;  . Incision and drainage of wound Left 09/16/2012    Procedure: Left Breast Evacuation of Hematoma;  Surgeon: Wayland Denis, DO;  Location: [facility] OR;  Service: Plastics;  Laterality: Left;  . Portacath placement Right  10/09/2012    Procedure: US GUIDED INSERTION PORT-Beard-CATH;  Surgeon: Adolph Pollack, MD;  Location: [facility];  Service: General;  Laterality: Right;  Right Subclavian Vein    REVIEW OF SYSTEMS:   General: fatigue (+), night sweats (-), fever (-), pain (-) Lymph: palpable nodes (-) HEENT: vision changes (-), mucositis (-), gum bleeding (-), epistaxis (-) Cardiovascular: chest pain (-), palpitations (-) Pulmonary: shortness of breath (-), dyspnea on exertion (-), cough (-), hemoptysis (-) GI:  Early satiety (-), melena (-), dysphagia (-), nausea/vomiting (-), diarrhea (-) GU: dysuria (-), hematuria (-), incontinence (-) Musculoskeletal: joint swelling (-), joint pain (-), back pain (-) Neuro: weakness (-), numbness (+), headache (-), confusion (-) Skin: Rash (-), lesions (-), dryness (-) Psych: depression (-), suicidal/homicidal  ideation (-), feeling of hopelessness (-)   PHYSICAL EXAMINATION: Blood pressure 118/77, pulse 108, temperature 97.8 F (36.6 C), temperature source Oral, resp. rate 18, height 5\' 5"  (1.651 m), weight 144 lb 1.6 oz (65.363 kg), last menstrual period 09/11/2012. Body mass index is 23.98 kg/(m^2). General: Patient is Beard well appearing female in no acute distress HEENT: PERRLA, sclerae anicteric no conjunctival pallor, MMM Neck: supple, no palpable adenopathy Lungs: clear to auscultation bilaterally, no wheezes, rhonchi, or rales Cardiovascular: regular rate rhythm, S1, S2, no murmurs, rubs or gallops Abdomen: Soft, non-tender, non-distended, normoactive bowel sounds, no HSM Extremities: warm and well perfused, no clubbing, cyanosis, or edema Skin: No rashes or lesions, scalp erythema appears to be Beard birthmark Neuro: Non-focal Breasts: bilateral mastectomy sites are well healed, no nodularity, masses, or skin changes, expanders in place. ECOG PERFORMANCE STATUS: 1 - Symptomatic but completely ambulatory  LABORATORY DATA: Lab Results  Component  Value Date   WBC 3.0* 01/20/2013   HGB 11.1* 01/20/2013   HCT 33.0* 01/20/2013   MCV 90.9 01/20/2013   PLT 204 01/20/2013      Chemistry      Component Value Date/Time   NA 139 01/20/2013 1216   NA 138 09/15/2012 2035   K 3.9 01/20/2013 1216   K 3.6 09/15/2012 2035   CL 105 01/06/2013 0827   CL 103 09/15/2012 2035   CO2 28 01/20/2013 1216   CO2 26 09/15/2012 2035   BUN 10.6 01/20/2013 1216   BUN 4* 09/15/2012 2035   CREATININE 0.6 01/20/2013 1216   CREATININE 0.54 09/15/2012 2035      Component Value Date/Time   CALCIUM 9.3 01/20/2013 1216   CALCIUM 9.0 09/15/2012 2035   ALKPHOS 141 01/20/2013 1216   ALKPHOS 142* 09/15/2012 2035   AST 40* 01/20/2013 1216   AST 32 09/15/2012 2035   ALT 73* 01/20/2013 1216   ALT 24 09/15/2012 2035   BILITOT 0.35 01/20/2013 1216   BILITOT 0.8 09/15/2012 2035     ADDITIONAL INFORMATION: 3. CHROMOGENIC IN-SITU HYBRIDIZATION Interpretation HER-2/NEU BY CISH - NO AMPLIFICATION OF HER-2 DETECTED. THE RATIO OF HER-2: CEP 17 SIGNALS WAS 1.52. Reference range: Ratio: HER2:CEP17 < 1.8 - gene amplification not observed Ratio: HER2:CEP 17 1.8-2.2 - equivocal result Ratio: HER2:CEP17 > 2.2 - gene amplification observed Pecola Leisure MD Pathologist, Electronic Signature ( Signed 09/22/2012) 3. PROGNOSTIC INDICATORS - ACIS Results IMMUNOHISTOCHEMICAL AND MORPHOMETRIC ANALYSIS BY THE AUTOMATED CELLULAR IMAGING SYSTEM (ACIS) Estrogen Receptor (Negative, <1%): 2%, POSITIVE, WEAK STAINING INTENSITY Progesterone Receptor (Negative, <1%): 0%, NEGATIVE COMMENT: The negative hormone receptor study in this case has an internal positive control. All controls stained appropriately Pecola Leisure MD Pathologist, Electronic Signature ( Signed 09/22/2012) 1 of 4 FINAL for Nancy Beard, Nancy Beard [phone]) FINAL DIAGNOSIS Diagnosis 1. Lymph node, sentinel, biopsy, Right axilla - ONE LYMPH NODE, NEGATIVE FOR TUMOR (0/1). 2. Breast, simple mastectomy, Left - BENIGN BREAST TISSUE WITH INTRADUCTAL PAPILLOMA, SEE  COMMENT. - NEGATIVE FOR ATYPIA OR MALIGNANCY. - SURGICAL MARGINS, NEGATIVE FOR ATYPIA OR MALIGNANCY. - MICROCALCIFICATIONS IDENTIFIED. 3. Breast, simple mastectomy, Right - INVASIVE DUCTAL CARCINOMA, GRADE III, WITH SPINDLE CELL DIFFERENTIATION (METAPLASTIC CARCINOMA) (2.3CM), SEE COMMENT. - NO LYMPHOVASCULAR INVASION IDENTIFIED. - INVASIVE TUMOR IS 1.5 CM FROM NEAREST MARGIN (DEEP). - DUCTAL CARCINOMA IN SITU, GRADE III, WITH COMEDONECROSIS. - SEE TUMOR SYNOPTIC TEMPLATE BELOW. Microscopic Comment 2. Although there was no mass grossly identified, there are definitive morphologic features of intraductal papilloma with associated fibrocystic change present. In addition, there are fibrocystic  changes with usual ductal hyperplasia in representative sections not involved by papilloma. Finally, foci of sclerosing adenosis and microcalcifications in benign ducts and lobules are present. The surgical resection margin(s) of the specimen were inked and microscopically evaluated. 3. BREAST, INVASIVE TUMOR, WITH LYMPH NODE SAMPLING Specimen, including laterality: Right breast. Procedure: Simple mastectomy. Grade: III of III. Tubule formation: 3. Nuclear pleomorphism: 3. Mitotic: 2. Tumor size (gross measurement): 2.3 cm. Margins: Invasive, distance to closest margin: 1.5 cm. In-situ, distance to closest margin: 1.5 cm (deep). If margin positive, focally or broadly: N/Beard. Lymphovascular invasion: Absent. Ductal carcinoma in situ: Present. Grade: III of III. Extensive intraductal component: Absent. Lobular neoplasia: Absent. Tumor focality: Unifocal. Treatment effect: None. If present, treatment effect in breast tissue, lymph nodes or both: N/Beard. Extent of tumor: Skin and Nipple: Grossly negative. Skeletal muscle: N/Beard. Lymph nodes: # examined: 1. Lymph nodes with metastasis: 0. Breast prognostic profile: Estrogen receptor: Repeated; previous study demonstrates 0% positivity  ([id number]). Progesterone receptor: Repeated; previous study demonstrates 0% positivity ([id number]). HER-2/neu: Repeated; previous study demonstrated no amplification (1.55) ([id number]). 2 of 4 FINAL for Nancy Beard, Nancy Beard Beard [phone]) Microscopic Comment(continued) Ki-67: Not repeated; previous study demonstrates 34% proliferation rate [phone]). Non-neoplastic breast: Intraductal papilloma with fibrocystic change and usual ductal hyperplasia, fibrocystic change with usual ductal hyperplasia, microcalcifications, and previous biopsy site. TNM: pT2, pN0, pMX. Comments: Although there are definitive features of high grade invasive ductal carcinoma identified, there are multiple foci where the carcinoma assumes Beard spindle cell morphology / spindle cell differentiation. 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 There are no heterologous elements identified. (CRR:eps 09/15/12)  RADIOGRAPHIC STUDIES:  Chest 2 View  09/08/2012  *RADIOLOGY REPORT*  Clinical Data: Right-sided breast  CHEST - 2 VIEW  Comparison: Chest x-ray 12/06/2009.  Findings: Lung volumes are normal.  No consolidative airspace disease.  No pleural effusions.  No pneumothorax.  No pulmonary nodule or mass noted.  Pulmonary vasculature and the cardiomediastinal silhouette are within normal limits.  Mild bilateral apical pleuroparenchymal thickening most compatible with chronic scarring, unchanged compared to the prior examination.  IMPRESSION: 1. No radiographic evidence of acute cardiopulmonary disease.   Original Report Authenticated By: Trudie Reed, M.D.    Nm Sentinel Node Inj-no Rpt (breast)  09/11/2012  CLINICAL DATA: Invasive right breast cancer   Sulfur colloid was injected intradermally by the nuclear medicine  technologist for breast cancer sentinel node localization.      ASSESSMENT: 57 year old female with  #1 invasive ductal  carcinoma of the right breast status post bilateral mastectomies for Beard 2.3 cm node negative essentially triple negative disease on the right. Without any evidence of malignancy on the left. Patient's Beard candidate for adjuvant chemotherapy. We discussed Adriamycin Cytoxan dose dense followed by weekly Taxol Carbo. Since her tumor was 2% estrogen receptor positive we will utilize an aromatase inhibitor for 5 years. She understands the rationale for this.  #2 patient is also having considerable anxiety and will continue to take Effexor daily.    #3 she also began Prilosec 20 mg daily for reflux.  #4 patient is slightly leukopenic she will receive Neupogen daily starting tomorrow for 4 days.  PLAN:   #1 Doing well. Ms. Graciano labs are stable.  She will proceed with chemotherapy.  She will continue to take Super B complex daily.  Her WBC is again decreased.  She will start Neupogen tomorrow at x 4 days.    #2  She will return for the next four days for Neupogen, and next week for her next cycle of chemotherapy.    All questions were answered. The patient knows to call the clinic with any problems, questions or concerns. We can certainly see the patient much sooner if necessary.  I spent 25 minutes counseling the patient face to face. The total time spent in the appointment was 30 minutes.   Drue Second, MD Medical/Oncology [facility] [phone] (beeper) [phone] (Office)  01/27/2013, 9:31 AM

## 2013-01-28 ENCOUNTER — Ambulatory Visit (HOSPITAL_BASED_OUTPATIENT_CLINIC_OR_DEPARTMENT_OTHER): Payer: Private Health Insurance - Indemnity

## 2013-01-28 ENCOUNTER — Encounter (INDEPENDENT_AMBULATORY_CARE_PROVIDER_SITE_OTHER): Payer: Self-pay | Admitting: General Surgery

## 2013-01-28 ENCOUNTER — Ambulatory Visit (INDEPENDENT_AMBULATORY_CARE_PROVIDER_SITE_OTHER): Payer: Private Health Insurance - Indemnity | Admitting: General Surgery

## 2013-01-28 VITALS — BP 126/81 | HR 118 | Temp 97.2°F

## 2013-01-28 VITALS — BP 108/64 | HR 64 | Temp 97.8°F | Resp 14 | Ht 66.0 in | Wt 145.2 lb

## 2013-01-28 DIAGNOSIS — C50319 Malignant neoplasm of lower-inner quadrant of unspecified female breast: Secondary | ICD-10-CM

## 2013-01-28 DIAGNOSIS — Z5189 Encounter for other specified aftercare: Secondary | ICD-10-CM

## 2013-01-28 DIAGNOSIS — C50311 Malignant neoplasm of lower-inner quadrant of right female breast: Secondary | ICD-10-CM

## 2013-01-28 DIAGNOSIS — Z853 Personal history of malignant neoplasm of breast: Secondary | ICD-10-CM

## 2013-01-28 MED ORDER — FILGRASTIM 480 MCG/0.8ML IJ SOLN
480.0000 ug | Freq: Once | INTRAMUSCULAR | Status: AC
  Administered 2013-01-28: 480 ug via SUBCUTANEOUS
  Filled 2013-01-28: qty 0.8

## 2013-01-28 NOTE — Patient Instructions (Signed)
Do a self exam of your chest wall like we discussed twice a month.

## 2013-01-28 NOTE — Progress Notes (Signed)
Procedure:  Bilateral mastectomies and right axillary SLNBx  Date:  09/11/12  Pathology: T2N0  Right breast cancer  Hx:  She is here for a long-term follow up visit.  She is currently taking chemotherapy and is tolerating this fairly well.    PE: General-NAD  Breasts/chest-expanders in space, no skin nodules  Lymph nodes-no axillary, supraclavicular or cervical adenopathy  Assessment:  Right breast cancer-no clinical evidence of recurrence.  Plan:  Return visit in 3 months.

## 2013-01-28 NOTE — Patient Instructions (Signed)
Filgrastim, G-CSF injection What is this medicine? FILGRASTIM, G-CSF (fil GRA stim) stimulates the formation of white blood cells. This medicine is given to patients with conditions that may cause a decrease in white blood cells, like those receiving certain types of chemotherapy or bone marrow transplant. It helps the bone marrow recover its ability to produce white blood cells. Increasing the amount of white blood cells helps to decrease the risk of infection and fever. This medicine may be used for other purposes; ask your health care provider or pharmacist if you have questions. What should I tell my health care provider before I take this medicine? They need to know if you have any of these conditions: -currently receiving radiation therapy -sickle cell disease -an unusual or allergic reaction to filgrastim, E. coli protein, other medicines, foods, dyes, or preservatives -pregnant or trying to get pregnant -breast-feeding How should I use this medicine? This medicine is for injection into a vein or injection under the skin. It is usually given by a health care professional in a hospital or clinic setting. If you get this medicine at home, you will be taught how to prepare and give this medicine. Always change the site for the injection under the skin. Let the solution warm to room temperature before you use it. Do not shake the solution before you withdraw a dose. Throw away any unused portion. Use exactly as directed. Take your medicine at regular intervals. Do not take your medicine more often than directed. It is important that you put your used needles and syringes in a special sharps container. Do not put them in a trash can. If you do not have a sharps container, call your pharmacist or healthcare provider to get one. Talk to your pediatrician regarding the use of this medicine in children. While this medicine may be prescribed for children for selected conditions, precautions do  apply. Overdosage: If you think you have taken too much of this medicine contact a poison control center or emergency room at once. NOTE: This medicine is only for you. Do not share this medicine with others. What if I miss a dose? Try not to miss doses. If you miss a dose take the dose as soon as you remember. If it is almost time for the next dose, do not take double doses unless told to by your doctor or health care professional. What may interact with this medicine? -lithium -medicines for cancer chemotherapy This list may not describe all possible interactions. Give your health care provider a list of all the medicines, herbs, non-prescription drugs, or dietary supplements you use. Also tell them if you smoke, drink alcohol, or use illegal drugs. Some items may interact with your medicine. What should I watch for while using this medicine? Visit your doctor or health care professional for regular checks on your progress. If you get a fever or any sign of infection while you are using this medicine, do not treat yourself. Check with your doctor or health care professional. Bone pain can usually be relieved by mild pain relievers such as acetaminophen or ibuprofen. Check with your doctor or health care professional before taking these medicines as they may hide a fever. Call your doctor or health care professional if the aches and pains are severe or do not go away. What side effects may I notice from receiving this medicine? Side effects that you should report to your doctor or health care professional as soon as possible: -allergic reactions like skin rash, itching   or hives, swelling of the face, lips, or tongue -difficulty breathing, wheezing -fever -pain, redness, or swelling at the injection site -stomach or side pain, or pain at the shoulder Side effects that usually do not require medical attention (report to your doctor or health care professional if they continue or are  bothersome): -bone pain (ribs, lower back, breast bone) -headache -skin rash This list may not describe all possible side effects. Call your doctor for medical advice about side effects. You may report side effects to FDA at 1-800-FDA-1088. Where should I keep my medicine? Keep out of the reach of children. Store in a refrigerator between 2 and 8 degrees C (36 and 46 degrees F). Do not freeze or leave in direct sunlight. If vials or syringes are left out of the refrigerator for more than 24 hours, they must be thrown away. Throw away unused vials after the expiration date on the carton. NOTE: This sheet is a summary. It may not cover all possible information. If you have questions about this medicine, talk to your doctor, pharmacist, or health care provider.  2013, [employer]. (09/17/2007 1:33:21 PM)  

## 2013-01-29 ENCOUNTER — Ambulatory Visit (HOSPITAL_BASED_OUTPATIENT_CLINIC_OR_DEPARTMENT_OTHER): Payer: Private Health Insurance - Indemnity

## 2013-01-29 VITALS — BP 132/82 | HR 99 | Temp 98.6°F

## 2013-01-29 DIAGNOSIS — C50319 Malignant neoplasm of lower-inner quadrant of unspecified female breast: Secondary | ICD-10-CM

## 2013-01-29 DIAGNOSIS — C50311 Malignant neoplasm of lower-inner quadrant of right female breast: Secondary | ICD-10-CM

## 2013-01-29 DIAGNOSIS — Z5189 Encounter for other specified aftercare: Secondary | ICD-10-CM

## 2013-01-29 MED ORDER — FILGRASTIM 480 MCG/0.8ML IJ SOLN
480.0000 ug | Freq: Once | INTRAMUSCULAR | Status: AC
  Administered 2013-01-29: 480 ug via SUBCUTANEOUS
  Filled 2013-01-29: qty 0.8

## 2013-01-30 ENCOUNTER — Ambulatory Visit (HOSPITAL_BASED_OUTPATIENT_CLINIC_OR_DEPARTMENT_OTHER): Payer: Private Health Insurance - Indemnity

## 2013-01-30 VITALS — BP 122/82 | HR 122 | Temp 98.4°F

## 2013-01-30 DIAGNOSIS — Z5189 Encounter for other specified aftercare: Secondary | ICD-10-CM

## 2013-01-30 DIAGNOSIS — C50319 Malignant neoplasm of lower-inner quadrant of unspecified female breast: Secondary | ICD-10-CM

## 2013-01-30 DIAGNOSIS — C50311 Malignant neoplasm of lower-inner quadrant of right female breast: Secondary | ICD-10-CM

## 2013-01-30 MED ORDER — FILGRASTIM 480 MCG/0.8ML IJ SOLN
480.0000 ug | Freq: Once | INTRAMUSCULAR | Status: AC
  Administered 2013-01-30: 480 ug via SUBCUTANEOUS
  Filled 2013-01-30: qty 0.8

## 2013-01-31 ENCOUNTER — Ambulatory Visit (HOSPITAL_BASED_OUTPATIENT_CLINIC_OR_DEPARTMENT_OTHER): Payer: Private Health Insurance - Indemnity

## 2013-01-31 VITALS — BP 125/79 | HR 122 | Temp 97.2°F

## 2013-01-31 DIAGNOSIS — Z5189 Encounter for other specified aftercare: Secondary | ICD-10-CM

## 2013-01-31 DIAGNOSIS — C50319 Malignant neoplasm of lower-inner quadrant of unspecified female breast: Secondary | ICD-10-CM

## 2013-01-31 MED ORDER — FILGRASTIM 480 MCG/0.8ML IJ SOLN
480.0000 ug | Freq: Once | INTRAMUSCULAR | Status: AC
  Administered 2013-01-31: 480 ug via SUBCUTANEOUS

## 2013-01-31 NOTE — Patient Instructions (Addendum)
Filgrastim, G-CSF injection What is this medicine? FILGRASTIM, G-CSF (fil GRA stim) stimulates the formation of white blood cells. This medicine is given to patients with conditions that may cause a decrease in white blood cells, like those receiving certain types of chemotherapy or bone marrow transplant. It helps the bone marrow recover its ability to produce white blood cells. Increasing the amount of white blood cells helps to decrease the risk of infection and fever. This medicine may be used for other purposes; ask your health care provider or pharmacist if you have questions. What should I tell my health care provider before I take this medicine? They need to know if you have any of these conditions: -currently receiving radiation therapy -sickle cell disease -an unusual or allergic reaction to filgrastim, E. coli protein, other medicines, foods, dyes, or preservatives -pregnant or trying to get pregnant -breast-feeding How should I use this medicine? This medicine is for injection into a vein or injection under the skin. It is usually given by a health care professional in a hospital or clinic setting. If you get this medicine at home, you will be taught how to prepare and give this medicine. Always change the site for the injection under the skin. Let the solution warm to room temperature before you use it. Do not shake the solution before you withdraw a dose. Throw away any unused portion. Use exactly as directed. Take your medicine at regular intervals. Do not take your medicine more often than directed. It is important that you put your used needles and syringes in a special sharps container. Do not put them in a trash can. If you do not have a sharps container, call your pharmacist or healthcare provider to get one. Talk to your pediatrician regarding the use of this medicine in children. While this medicine may be prescribed for children for selected conditions, precautions do  apply. Overdosage: If you think you have taken too much of this medicine contact a poison control center or emergency room at once. NOTE: This medicine is only for you. Do not share this medicine with others. What if I miss a dose? Try not to miss doses. If you miss a dose take the dose as soon as you remember. If it is almost time for the next dose, do not take double doses unless told to by your doctor or health care professional. What may interact with this medicine? -lithium -medicines for cancer chemotherapy This list may not describe all possible interactions. Give your health care provider a list of all the medicines, herbs, non-prescription drugs, or dietary supplements you use. Also tell them if you smoke, drink alcohol, or use illegal drugs. Some items may interact with your medicine. What should I watch for while using this medicine? Visit your doctor or health care professional for regular checks on your progress. If you get a fever or any sign of infection while you are using this medicine, do not treat yourself. Check with your doctor or health care professional. Bone pain can usually be relieved by mild pain relievers such as acetaminophen or ibuprofen. Check with your doctor or health care professional before taking these medicines as they may hide a fever. Call your doctor or health care professional if the aches and pains are severe or do not go away. What side effects may I notice from receiving this medicine? Side effects that you should report to your doctor or health care professional as soon as possible: -allergic reactions like skin rash, itching   or hives, swelling of the face, lips, or tongue -difficulty breathing, wheezing -fever -pain, redness, or swelling at the injection site -stomach or side pain, or pain at the shoulder Side effects that usually do not require medical attention (report to your doctor or health care professional if they continue or are  bothersome): -bone pain (ribs, lower back, breast bone) -headache -skin rash This list may not describe all possible side effects. Call your doctor for medical advice about side effects. You may report side effects to FDA at 1-800-FDA-1088. Where should I keep my medicine? Keep out of the reach of children. Store in a refrigerator between 2 and 8 degrees C (36 and 46 degrees F). Do not freeze or leave in direct sunlight. If vials or syringes are left out of the refrigerator for more than 24 hours, they must be thrown away. Throw away unused vials after the expiration date on the carton. NOTE: This sheet is a summary. It may not cover all possible information. If you have questions about this medicine, talk to your doctor, pharmacist, or health care provider.  2013, [employer]. (09/17/2007 1:33:21 PM)  

## 2013-02-03 ENCOUNTER — Ambulatory Visit (HOSPITAL_BASED_OUTPATIENT_CLINIC_OR_DEPARTMENT_OTHER): Payer: Private Health Insurance - Indemnity | Admitting: Adult Health

## 2013-02-03 ENCOUNTER — Telehealth: Payer: Self-pay | Admitting: Oncology

## 2013-02-03 ENCOUNTER — Ambulatory Visit (HOSPITAL_BASED_OUTPATIENT_CLINIC_OR_DEPARTMENT_OTHER): Payer: Private Health Insurance - Indemnity

## 2013-02-03 ENCOUNTER — Other Ambulatory Visit (HOSPITAL_BASED_OUTPATIENT_CLINIC_OR_DEPARTMENT_OTHER): Payer: Private Health Insurance - Indemnity | Admitting: Lab

## 2013-02-03 ENCOUNTER — Other Ambulatory Visit: Payer: Self-pay | Admitting: Adult Health

## 2013-02-03 ENCOUNTER — Encounter: Payer: Self-pay | Admitting: Oncology

## 2013-02-03 ENCOUNTER — Encounter: Payer: Self-pay | Admitting: Adult Health

## 2013-02-03 VITALS — BP 132/84 | HR 105 | Temp 97.9°F | Resp 20 | Ht 66.0 in | Wt 143.1 lb

## 2013-02-03 DIAGNOSIS — C50311 Malignant neoplasm of lower-inner quadrant of right female breast: Secondary | ICD-10-CM

## 2013-02-03 DIAGNOSIS — Z5111 Encounter for antineoplastic chemotherapy: Secondary | ICD-10-CM

## 2013-02-03 DIAGNOSIS — C50319 Malignant neoplasm of lower-inner quadrant of unspecified female breast: Secondary | ICD-10-CM

## 2013-02-03 LAB — CBC WITH DIFFERENTIAL/PLATELET
BASO%: 1.4 % (ref 0.0–2.0)
Basophils Absolute: 0.1 10*3/uL (ref 0.0–0.1)
EOS%: 0.6 % (ref 0.0–7.0)
Eosinophils Absolute: 0 10*3/uL (ref 0.0–0.5)
HCT: 33.2 % — ABNORMAL LOW (ref 34.8–46.6)
HGB: 11.1 g/dL — ABNORMAL LOW (ref 11.6–15.9)
LYMPH%: 29.2 % (ref 14.0–49.7)
MCH: 32.4 pg (ref 25.1–34.0)
MCHC: 33.4 g/dL (ref 31.5–36.0)
MCV: 96.8 fL (ref 79.5–101.0)
MONO#: 1 10*3/uL — ABNORMAL HIGH (ref 0.1–0.9)
MONO%: 27.8 % — ABNORMAL HIGH (ref 0.0–14.0)
NEUT#: 1.5 10*3/uL (ref 1.5–6.5)
NEUT%: 41 % (ref 38.4–76.8)
Platelets: 219 10*3/uL (ref 145–400)
RBC: 3.43 10*6/uL — ABNORMAL LOW (ref 3.70–5.45)
RDW: 19.6 % — ABNORMAL HIGH (ref 11.2–14.5)
WBC: 3.6 10*3/uL — ABNORMAL LOW (ref 3.9–10.3)
lymph#: 1 10*3/uL (ref 0.9–3.3)

## 2013-02-03 LAB — COMPREHENSIVE METABOLIC PANEL (CC13)
ALT: 125 U/L — ABNORMAL HIGH (ref 0–55)
AST: 66 U/L — ABNORMAL HIGH (ref 5–34)
Albumin: 3.5 g/dL (ref 3.5–5.0)
Alkaline Phosphatase: 217 U/L — ABNORMAL HIGH (ref 40–150)
BUN: 10 mg/dL (ref 7.0–26.0)
CO2: 26 mEq/L (ref 22–29)
Calcium: 9.5 mg/dL (ref 8.4–10.4)
Chloride: 104 mEq/L (ref 98–109)
Creatinine: 0.7 mg/dL (ref 0.6–1.1)
Glucose: 118 mg/dl (ref 70–140)
Potassium: 3.7 mEq/L (ref 3.5–5.1)
Sodium: 137 mEq/L (ref 136–145)
Total Bilirubin: 0.38 mg/dL (ref 0.20–1.20)
Total Protein: 6.7 g/dL (ref 6.4–8.3)

## 2013-02-03 MED ORDER — SODIUM CHLORIDE 0.9 % IV SOLN
Freq: Once | INTRAVENOUS | Status: AC
  Administered 2013-02-03: 11:00:00 via INTRAVENOUS

## 2013-02-03 MED ORDER — PACLITAXEL CHEMO INJECTION 300 MG/50ML
80.0000 mg/m2 | Freq: Once | INTRAVENOUS | Status: AC
  Administered 2013-02-03: 132 mg via INTRAVENOUS
  Filled 2013-02-03: qty 22

## 2013-02-03 MED ORDER — DIPHENHYDRAMINE HCL 50 MG/ML IJ SOLN
50.0000 mg | Freq: Once | INTRAMUSCULAR | Status: AC
  Administered 2013-02-03: 50 mg via INTRAVENOUS

## 2013-02-03 MED ORDER — CARBOPLATIN CHEMO INJECTION 450 MG/45ML
248.4000 mg | Freq: Once | INTRAVENOUS | Status: AC
  Administered 2013-02-03: 250 mg via INTRAVENOUS
  Filled 2013-02-03: qty 25

## 2013-02-03 MED ORDER — SODIUM CHLORIDE 0.9 % IV SOLN
50.0000 mg | Freq: Once | INTRAVENOUS | Status: AC
  Administered 2013-02-03: 50 mg via INTRAVENOUS
  Filled 2013-02-03: qty 2

## 2013-02-03 MED ORDER — SODIUM CHLORIDE 0.9 % IJ SOLN
10.0000 mL | INTRAMUSCULAR | Status: DC | PRN
  Administered 2013-02-03: 10 mL
  Filled 2013-02-03: qty 10

## 2013-02-03 MED ORDER — ONDANSETRON 16 MG/50ML IVPB (CHCC)
16.0000 mg | Freq: Once | INTRAVENOUS | Status: AC
  Administered 2013-02-03: 16 mg via INTRAVENOUS

## 2013-02-03 MED ORDER — FAMOTIDINE IN NACL 20-0.9 MG/50ML-% IV SOLN: 20.0000 mg | Freq: Once | INTRAVENOUS | Status: DC

## 2013-02-03 MED ORDER — DEXAMETHASONE SODIUM PHOSPHATE 20 MG/5ML IJ SOLN
20.0000 mg | Freq: Once | INTRAMUSCULAR | Status: AC
  Administered 2013-02-03: 20 mg via INTRAVENOUS

## 2013-02-03 MED ORDER — HEPARIN SOD (PORK) LOCK FLUSH 100 UNIT/ML IV SOLN
500.0000 [IU] | Freq: Once | INTRAVENOUS | Status: AC | PRN
  Administered 2013-02-03: 500 [IU]
  Filled 2013-02-03: qty 5

## 2013-02-03 NOTE — Telephone Encounter (Signed)
Added inj appts for 7/23 thru 7/26 and gv pt new schedule.

## 2013-02-03 NOTE — Progress Notes (Signed)
OFFICE PROGRESS NOTE  CC  Nancy Mew, MD [address] [place] [address] Dr. Lurline Hare  Dr. 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 Recently she underwent Beard screening mammogram that showed Beard spiculated mass in the lower inner quadrant of the right breast. She had ultrasound performed that showed irregular mass at the 5:00 position 3 cm from the nipple measuring 1.1 cm. She had Beard needle core biopsy performed on 06/19/2012 the biopsy showed invasive ductal carcinoma with ductal carcinoma in situ grade 3 tumor was ER negative PR negative HER-2/neu negative. Ki-67 was elevated at 34%. She went on to have MRI of the breasts performed on 06/24/2012 the MRI showed in the middle third of the lower inner quadrant of the right breast Beard 1.5 x 1.3 x 1.9 cm irregular enhancing mass. No abnormal enhancement was seen in the left breast no enlarged axillary or internal mammary adenopathy was detected.   #2 patient is now status post bilateral mastectomies. On her right mastectomy she was found to have Beard 2.3 cm invasive ductal carcinoma 01 lymph nodes were positive for metastatic disease and this is T2 N0) stage II. Tumor was grade 3 ER 2% PR negative HER-2/neu negative with Beard Ki-67 at 34%. Left mastectomy only showed intraductal papilloma node atypia or malignancy was found.  #3 patient is now being considered for adjuvant chemotherapy since her tumor is essentially triple negative and is Beard stage II. We discussed the rationale for adjuvant chemotherapy. She will receive Adriamycin Cytoxan every 2 weeks for Beard total of 4 cycles with G-CSF support. Once  she completes this then she will receive Taxol and carboplatinum every week for Beard total of 12 weeks. I did discuss with her use of antiestrogen therapy such as an aromatase inhibitor since her tumor is 2% ER positive. I discussed the rationale for this.  CURRENT THERAPY: Taxol Carbo week 8  INTERVAL HISTORY: Nancy Beard 57 y.o. female returns for followup of her right breast cancer. She is doing very well today.  The numbness is stable.  She denies fevers, chills, nausea, vomiting, constipation, diarrhea, or any further concerns.  She denies pain or palpitations.  She recently saw her surgeon who evaluated her breasts and expanders.  Beard 10 point ROS is otherwise negative.   MEDICAL HISTORY: Past Medical History  Diagnosis Date  . Thyroid disease   . Depression   . Sprue   . Diverticulosis   . Hyperlipidemia   . Breast cancer     right  . Hypothyroidism   . Pneumonia     ALLERGIES:  is allergic to ciprofloxacin and gluten meal.  MEDICATIONS:  Current Outpatient Prescriptions  Medication Sig Dispense Refill  . acetaminophen (TYLENOL) 325 MG tablet Take 650 mg by mouth as needed.      . B Complex-C (SUPER B COMPLEX PO) Take 1 tablet by mouth daily.      [name] dexamethasone (DECADRON) 4 MG tablet       . levothyroxine (SYNTHROID, LEVOTHROID) 25 MCG tablet Take 25 mcg by mouth daily.      [name] lidocaine-prilocaine (EMLA) cream Apply topically as needed.  30 g  8  . liothyronine (CYTOMEL) 25 MCG tablet Take 12.5 mcg by mouth daily.      . Multiple Vitamin (MULTIVITAMIN) tablet Take 1 tablet by mouth daily.      . ondansetron (ZOFRAN) 8 MG tablet Take 1 tablet (8 mg total) by mouth 2 (two) times daily. Take two times Beard day starting the day after chemo for 3 days. Then take two times Beard day as needed for nausea or vomiting.  30 tablet  1  . prochlorperazine (COMPAZINE) 10 MG tablet       . prochlorperazine (COMPAZINE) 25 MG suppository Place 1 suppository (25 mg total) rectally every 12  (twelve) hours as needed for nausea.  12 suppository  3  . valACYclovir (VALTREX) 500 MG tablet Take 1 tablet (500 mg total) by mouth 2 (two) times daily.  60 tablet  6  . venlafaxine XR (EFFEXOR-XR) 75 MG 24 hr capsule Take 1 capsule (75 mg total) by mouth daily.  90 capsule  3   No current facility-administered medications for this visit.    SURGICAL HISTORY:  Past Surgical History  Procedure Laterality Date  . Esophagogastroduodenoscopy  2007    sprue  . Colonoscopy  2006  . Cryoblation of cervix    . Breast lumpectomy    . Total mastectomy Bilateral 09/11/2012    Procedure: bilateral MASTECTOMY;  Surgeon: Adolph Pollack, MD;  Location: [facility] OR;  Service: General;  Laterality: Bilateral;  . Axillary sentinel node biopsy Right 09/11/2012    Procedure: AXILLARY SENTINEL lymph NODE  BIOPSY;  Surgeon: Adolph Pollack, MD;  Location: [facility] OR;  Service: General;  Laterality: Right;  right nuclear medicine injection 12:30   . Breast reconstruction with placement of tissue expander and flex hd (acellular hydrated dermis) Bilateral 09/11/2012    Procedure: BREAST RECONSTRUCTION WITH PLACEMENT OF TISSUE EXPANDER AND FLEX HD (ACELLULAR HYDRATED DERMIS) ADM;  Surgeon: Wayland Denis, DO;  Location: [facility] OR;  Service: Plastics;  Laterality: Bilateral;  . Incision and drainage of wound Left 09/16/2012    Procedure: Left Breast Evacuation of Hematoma;  Surgeon: Wayland Denis, DO;  Location: [facility] OR;  Service: Plastics;  Laterality: Left;  . Portacath placement Right 10/09/2012    Procedure: US GUIDED INSERTION PORT-Beard-CATH;  Surgeon: Adolph Pollack, MD;  Location: [facility];  Service: General;  Laterality: Right;  Right Subclavian Vein    REVIEW OF SYSTEMS:   General: fatigue (+), night sweats (-), fever (-), pain (-) Lymph: palpable nodes (-) HEENT: vision changes (-), mucositis (-), gum bleeding (-), epistaxis (-) Cardiovascular: chest pain (-), palpitations (-) Pulmonary: shortness of  breath (-), dyspnea on exertion (-), cough (-), hemoptysis (-) GI:  Early satiety (-), melena (-), dysphagia (-), nausea/vomiting (-), diarrhea (-) GU: dysuria (-), hematuria (-), incontinence (-) Musculoskeletal: joint swelling (-), joint pain (-), back pain (-) Neuro: weakness (-), numbness (+), headache (-), confusion (-) Skin: Rash (-), lesions (-), dryness (-) Psych: depression (-), suicidal/homicidal ideation (-), feeling of hopelessness (-)   PHYSICAL EXAMINATION: Blood pressure 132/84, pulse 105, temperature 97.9 F (36.6 C), temperature source Oral, resp. rate 20, height 5\' 6"  (1.676 m), weight 143 lb 1.6 oz (64.91 kg), last menstrual period 09/11/2012. Body mass index is 23.11 kg/(m^2). General: Patient is Beard well appearing female in no acute distress HEENT: PERRLA, sclerae anicteric no conjunctival pallor, MMM Neck: supple, no palpable adenopathy Lungs: clear to auscultation bilaterally, no wheezes, rhonchi, or rales Cardiovascular: regular rate rhythm,  S1, S2, no murmurs, rubs or gallops Abdomen: Soft, non-tender, non-distended, normoactive bowel sounds, no HSM Extremities: warm and well perfused, no clubbing, cyanosis, or edema Skin: No rashes or lesions, scalp erythema appears to be Beard birthmark Neuro: Non-focal Breasts: bilateral mastectomy sites are well healed, no nodularity, masses, or skin changes, expanders in place. ECOG PERFORMANCE STATUS: 1 - Symptomatic but completely ambulatory  LABORATORY DATA: Lab Results  Component Value Date   WBC 3.6* 02/03/2013   HGB 11.1* 02/03/2013   HCT 33.2* 02/03/2013   MCV 96.8 02/03/2013   PLT 219 02/03/2013      Chemistry      Component Value Date/Time   NA 139 01/27/2013 0859   NA 138 09/15/2012 2035   K 3.8 01/27/2013 0859   K 3.6 09/15/2012 2035   CL 105 01/06/2013 0827   CL 103 09/15/2012 2035   CO2 24 01/27/2013 0859   CO2 26 09/15/2012 2035   BUN 9.0 01/27/2013 0859   BUN 4* 09/15/2012 2035   CREATININE 0.7 01/27/2013 0859    CREATININE 0.54 09/15/2012 2035      Component Value Date/Time   CALCIUM 9.3 01/27/2013 0859   CALCIUM 9.0 09/15/2012 2035   ALKPHOS 189* 01/27/2013 0859   ALKPHOS 142* 09/15/2012 2035   AST 57* 01/27/2013 0859   AST 32 09/15/2012 2035   ALT 96* 01/27/2013 0859   ALT 24 09/15/2012 2035   BILITOT 0.36 01/27/2013 0859   BILITOT 0.8 09/15/2012 2035     ADDITIONAL INFORMATION: 3. CHROMOGENIC IN-SITU HYBRIDIZATION Interpretation HER-2/NEU BY CISH - NO AMPLIFICATION OF HER-2 DETECTED. THE RATIO OF HER-2: CEP 17 SIGNALS WAS 1.52. Reference range: Ratio: HER2:CEP17 < 1.8 - gene amplification not observed Ratio: HER2:CEP 17 1.8-2.2 - equivocal result Ratio: HER2:CEP17 > 2.2 - gene amplification observed Pecola Leisure MD Pathologist, Electronic Signature ( Signed 09/22/2012) 3. PROGNOSTIC INDICATORS - ACIS Results IMMUNOHISTOCHEMICAL AND MORPHOMETRIC ANALYSIS BY THE AUTOMATED CELLULAR IMAGING SYSTEM (ACIS) Estrogen Receptor (Negative, <1%): 2%, POSITIVE, WEAK STAINING INTENSITY Progesterone Receptor (Negative, <1%): 0%, NEGATIVE COMMENT: The negative hormone receptor study in this case has an internal positive control. All controls stained appropriately Pecola Leisure MD Pathologist, Electronic Signature ( Signed 09/22/2012) 1 of 4 FINAL for Nancy Beard, Nancy Beard [phone]) FINAL DIAGNOSIS Diagnosis 1. Lymph node, sentinel, biopsy, Right axilla - ONE LYMPH NODE, NEGATIVE FOR TUMOR (0/1). 2. Breast, simple mastectomy, Left - BENIGN BREAST TISSUE WITH INTRADUCTAL PAPILLOMA, SEE COMMENT. - NEGATIVE FOR ATYPIA OR MALIGNANCY. - SURGICAL MARGINS, NEGATIVE FOR ATYPIA OR MALIGNANCY. - MICROCALCIFICATIONS IDENTIFIED. 3. Breast, simple mastectomy, Right - INVASIVE DUCTAL CARCINOMA, GRADE III, WITH SPINDLE CELL DIFFERENTIATION (METAPLASTIC CARCINOMA) (2.3CM), SEE COMMENT. - NO LYMPHOVASCULAR INVASION IDENTIFIED. - INVASIVE TUMOR IS 1.5 CM FROM NEAREST MARGIN (DEEP). - DUCTAL CARCINOMA IN SITU, GRADE III,  WITH COMEDONECROSIS. - SEE TUMOR SYNOPTIC TEMPLATE BELOW. Microscopic Comment 2. Although there was no mass grossly identified, there are definitive morphologic features of intraductal papilloma with associated fibrocystic change present. In addition, there are fibrocystic changes with usual ductal hyperplasia in representative sections not involved by papilloma. Finally, foci of sclerosing adenosis and microcalcifications in benign ducts and lobules are present. The surgical resection margin(s) of the specimen were inked and microscopically evaluated. 3. BREAST, INVASIVE TUMOR, WITH LYMPH NODE SAMPLING Specimen, including laterality: Right breast. Procedure: Simple mastectomy. Grade: III of III. Tubule formation: 3. Nuclear pleomorphism: 3. Mitotic: 2. Tumor size (gross measurement): 2.3 cm. Margins: Invasive, distance to closest margin: 1.5 cm. In-situ, distance to closest margin: 1.5  cm (deep). If margin positive, focally or broadly: N/Beard. Lymphovascular invasion: Absent. Ductal carcinoma in situ: Present. Grade: III of III. Extensive intraductal component: Absent. Lobular neoplasia: Absent. Tumor focality: Unifocal. Treatment effect: None. If present, treatment effect in breast tissue, lymph nodes or both: N/Beard. Extent of tumor: Skin and Nipple: Grossly negative. Skeletal muscle: N/Beard. Lymph nodes: # examined: 1. Lymph nodes with metastasis: 0. Breast prognostic profile: Estrogen receptor: Repeated; previous study demonstrates 0% positivity ([id number]). Progesterone receptor: Repeated; previous study demonstrates 0% positivity ([id number]). HER-2/neu: Repeated; previous study demonstrated no amplification (1.55) ([id number]). 2 of 4 FINAL for Nancy Beard, Nancy Beard [phone]) Microscopic Comment(continued) Ki-67: Not repeated; previous study demonstrates 34% proliferation rate [phone]). Non-neoplastic breast: Intraductal papilloma with fibrocystic change and usual  ductal hyperplasia, fibrocystic change with usual ductal hyperplasia, microcalcifications, and previous biopsy site. TNM: pT2, pN0, pMX. Comments: Although there are definitive features of high grade invasive ductal carcinoma identified, there are multiple foci where the carcinoma assumes Beard spindle cell morphology / spindle cell differentiation. 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 There are no heterologous elements identified. (CRR:eps 09/15/12)  RADIOGRAPHIC STUDIES:  Chest 2 View  09/08/2012  *RADIOLOGY REPORT*  Clinical Data: Right-sided breast  CHEST - 2 VIEW  Comparison: Chest x-ray 12/06/2009.  Findings: Lung volumes are normal.  No consolidative airspace disease.  No pleural effusions.  No pneumothorax.  No pulmonary nodule or mass noted.  Pulmonary vasculature and the cardiomediastinal silhouette are within normal limits.  Mild bilateral apical pleuroparenchymal thickening most compatible with chronic scarring, unchanged compared to the prior examination.  IMPRESSION: 1. No radiographic evidence of acute cardiopulmonary disease.   Original Report Authenticated By: Trudie Reed, M.D.    Nm Sentinel Node Inj-no Rpt (breast)  09/11/2012  CLINICAL DATA: Invasive right breast cancer   Sulfur colloid was injected intradermally by the nuclear medicine  technologist for breast cancer sentinel node localization.      ASSESSMENT: 57 year old female with  #1 invasive ductal carcinoma of the right breast status post bilateral mastectomies for Beard 2.3 cm node negative essentially triple negative disease on the right. Without any evidence of malignancy on the left. Patient's Beard candidate for adjuvant chemotherapy. We discussed Adriamycin Cytoxan dose dense followed by weekly Taxol Carbo. Since her tumor was 2% estrogen receptor positive we will utilize an aromatase inhibitor for 5 years. She understands the rationale for  this.  #2 patient is also having considerable anxiety and will continue to take Effexor daily.    #3 she also began Prilosec 20 mg daily for reflux.  #4 patient is slightly leukopenic she will receive Neupogen daily starting tomorrow for 4 days.  PLAN:  #1 Doing well, labs are stable.  Patient will proceed with chemotherapy.  She will continue taking Super B complex.    #2 She will return for the next four days for Neupogen, and next week for her next cycle of chemotherapy.    All questions were answered. The patient knows to call the clinic with any problems, questions or concerns. We can certainly see the patient much sooner if necessary.  I spent 25 minutes counseling the patient face to face. The total time spent in the appointment was 30 minutes.   Cherie Ouch Lyn Hollingshead, NP Medical Oncology [facility] Phone: [phone] 02/03/2013, 9:34 AM

## 2013-02-03 NOTE — Progress Notes (Signed)
Put disability form on nurse's desk. °

## 2013-02-03 NOTE — Patient Instructions (Signed)
Doing well.  Proceed with chemotherapy.  Return for the next 4 days for Neupogen.  Please call us if you have any questions or concerns.

## 2013-02-03 NOTE — Patient Instructions (Addendum)
[facility] Discharge Instructions for Patients Receiving Chemotherapy  Today you received the following chemotherapy agents: Taxol, Carboplatin  To help prevent nausea and vomiting after your treatment, we encourage you to take your nausea medication as directed by your MD.   If you develop nausea and vomiting that is not controlled by your nausea medication, call the clinic.   BELOW ARE SYMPTOMS THAT SHOULD BE REPORTED IMMEDIATELY:  *FEVER GREATER THAN 100.5 F  *CHILLS WITH OR WITHOUT FEVER  NAUSEA AND VOMITING THAT IS NOT CONTROLLED WITH YOUR NAUSEA MEDICATION  *UNUSUAL SHORTNESS OF BREATH  *UNUSUAL BRUISING OR BLEEDING  TENDERNESS IN MOUTH AND THROAT WITH OR WITHOUT PRESENCE OF ULCERS  *URINARY PROBLEMS  *BOWEL PROBLEMS  UNUSUAL RASH Items with * indicate a potential emergency and should be followed up as soon as possible.  Feel free to call the clinic you have any questions or concerns. The clinic phone number is [phone].

## 2013-02-04 ENCOUNTER — Ambulatory Visit (HOSPITAL_BASED_OUTPATIENT_CLINIC_OR_DEPARTMENT_OTHER): Payer: Private Health Insurance - Indemnity

## 2013-02-04 VITALS — BP 116/98 | HR 111 | Temp 98.8°F

## 2013-02-04 DIAGNOSIS — C50311 Malignant neoplasm of lower-inner quadrant of right female breast: Secondary | ICD-10-CM

## 2013-02-04 DIAGNOSIS — Z5189 Encounter for other specified aftercare: Secondary | ICD-10-CM

## 2013-02-04 DIAGNOSIS — C50319 Malignant neoplasm of lower-inner quadrant of unspecified female breast: Secondary | ICD-10-CM

## 2013-02-04 MED ORDER — FILGRASTIM 480 MCG/0.8ML IJ SOLN
480.0000 ug | Freq: Once | INTRAMUSCULAR | Status: AC
  Administered 2013-02-04: 480 ug via SUBCUTANEOUS
  Filled 2013-02-04: qty 0.8

## 2013-02-05 ENCOUNTER — Ambulatory Visit (HOSPITAL_BASED_OUTPATIENT_CLINIC_OR_DEPARTMENT_OTHER): Payer: Private Health Insurance - Indemnity

## 2013-02-05 ENCOUNTER — Encounter: Payer: Self-pay | Admitting: Oncology

## 2013-02-05 VITALS — BP 141/92 | HR 112 | Temp 98.4°F

## 2013-02-05 DIAGNOSIS — C50311 Malignant neoplasm of lower-inner quadrant of right female breast: Secondary | ICD-10-CM

## 2013-02-05 DIAGNOSIS — Z5189 Encounter for other specified aftercare: Secondary | ICD-10-CM

## 2013-02-05 DIAGNOSIS — C50319 Malignant neoplasm of lower-inner quadrant of unspecified female breast: Secondary | ICD-10-CM

## 2013-02-05 MED ORDER — FILGRASTIM 480 MCG/0.8ML IJ SOLN
480.0000 ug | Freq: Once | INTRAMUSCULAR | Status: AC
  Administered 2013-02-05: 480 ug via SUBCUTANEOUS
  Filled 2013-02-05: qty 0.8

## 2013-02-05 NOTE — Progress Notes (Signed)
Faxed disability form to [employer] @ [phone] °

## 2013-02-06 ENCOUNTER — Ambulatory Visit (HOSPITAL_BASED_OUTPATIENT_CLINIC_OR_DEPARTMENT_OTHER): Payer: Private Health Insurance - Indemnity

## 2013-02-06 VITALS — BP 125/93 | HR 113 | Temp 98.3°F

## 2013-02-06 DIAGNOSIS — D72819 Decreased white blood cell count, unspecified: Secondary | ICD-10-CM

## 2013-02-06 DIAGNOSIS — Z5189 Encounter for other specified aftercare: Secondary | ICD-10-CM

## 2013-02-06 DIAGNOSIS — C50319 Malignant neoplasm of lower-inner quadrant of unspecified female breast: Secondary | ICD-10-CM

## 2013-02-06 DIAGNOSIS — C50311 Malignant neoplasm of lower-inner quadrant of right female breast: Secondary | ICD-10-CM

## 2013-02-06 MED ORDER — FILGRASTIM 480 MCG/0.8ML IJ SOLN
480.0000 ug | Freq: Once | INTRAMUSCULAR | Status: AC
  Administered 2013-02-06: 480 ug via SUBCUTANEOUS
  Filled 2013-02-06: qty 0.8

## 2013-02-07 ENCOUNTER — Ambulatory Visit (HOSPITAL_BASED_OUTPATIENT_CLINIC_OR_DEPARTMENT_OTHER): Payer: Private Health Insurance - Indemnity

## 2013-02-07 VITALS — BP 126/81 | HR 117 | Temp 97.7°F | Resp 20

## 2013-02-07 DIAGNOSIS — C50319 Malignant neoplasm of lower-inner quadrant of unspecified female breast: Secondary | ICD-10-CM

## 2013-02-07 DIAGNOSIS — Z5189 Encounter for other specified aftercare: Secondary | ICD-10-CM

## 2013-02-07 MED ORDER — FILGRASTIM 480 MCG/0.8ML IJ SOLN
480.0000 ug | Freq: Once | INTRAMUSCULAR | Status: AC
  Administered 2013-02-07: 480 ug via SUBCUTANEOUS

## 2013-02-10 ENCOUNTER — Ambulatory Visit (HOSPITAL_BASED_OUTPATIENT_CLINIC_OR_DEPARTMENT_OTHER): Payer: Private Health Insurance - Indemnity

## 2013-02-10 ENCOUNTER — Encounter: Payer: Self-pay | Admitting: Adult Health

## 2013-02-10 ENCOUNTER — Ambulatory Visit (HOSPITAL_BASED_OUTPATIENT_CLINIC_OR_DEPARTMENT_OTHER): Payer: Private Health Insurance - Indemnity | Admitting: Adult Health

## 2013-02-10 ENCOUNTER — Telehealth: Payer: Self-pay | Admitting: *Deleted

## 2013-02-10 ENCOUNTER — Other Ambulatory Visit (HOSPITAL_BASED_OUTPATIENT_CLINIC_OR_DEPARTMENT_OTHER): Payer: Private Health Insurance - Indemnity | Admitting: Lab

## 2013-02-10 VITALS — BP 115/77 | HR 125 | Temp 98.2°F | Resp 20 | Ht 66.0 in | Wt 142.3 lb

## 2013-02-10 DIAGNOSIS — C50319 Malignant neoplasm of lower-inner quadrant of unspecified female breast: Secondary | ICD-10-CM

## 2013-02-10 DIAGNOSIS — G629 Polyneuropathy, unspecified: Secondary | ICD-10-CM

## 2013-02-10 DIAGNOSIS — R209 Unspecified disturbances of skin sensation: Secondary | ICD-10-CM

## 2013-02-10 DIAGNOSIS — K219 Gastro-esophageal reflux disease without esophagitis: Secondary | ICD-10-CM

## 2013-02-10 DIAGNOSIS — Z5111 Encounter for antineoplastic chemotherapy: Secondary | ICD-10-CM

## 2013-02-10 DIAGNOSIS — D72819 Decreased white blood cell count, unspecified: Secondary | ICD-10-CM

## 2013-02-10 DIAGNOSIS — C50311 Malignant neoplasm of lower-inner quadrant of right female breast: Secondary | ICD-10-CM

## 2013-02-10 LAB — CBC WITH DIFFERENTIAL/PLATELET
BASO%: 1.3 % (ref 0.0–2.0)
Basophils Absolute: 0.1 10*3/uL (ref 0.0–0.1)
EOS%: 0.3 % (ref 0.0–7.0)
Eosinophils Absolute: 0 10*3/uL (ref 0.0–0.5)
HCT: 34.1 % — ABNORMAL LOW (ref 34.8–46.6)
HGB: 11.4 g/dL — ABNORMAL LOW (ref 11.6–15.9)
LYMPH%: 26.6 % (ref 14.0–49.7)
MCH: 32.9 pg (ref 25.1–34.0)
MCHC: 33.4 g/dL (ref 31.5–36.0)
MCV: 98.6 fL (ref 79.5–101.0)
MONO#: 1.3 10*3/uL — ABNORMAL HIGH (ref 0.1–0.9)
MONO%: 33.9 % — ABNORMAL HIGH (ref 0.0–14.0)
NEUT#: 1.4 10*3/uL — ABNORMAL LOW (ref 1.5–6.5)
NEUT%: 37.9 % — ABNORMAL LOW (ref 38.4–76.8)
Platelets: 249 10*3/uL (ref 145–400)
RBC: 3.46 10*6/uL — ABNORMAL LOW (ref 3.70–5.45)
RDW: 18.1 % — ABNORMAL HIGH (ref 11.2–14.5)
WBC: 3.8 10*3/uL — ABNORMAL LOW (ref 3.9–10.3)
lymph#: 1 10*3/uL (ref 0.9–3.3)
nRBC: 1 % — ABNORMAL HIGH (ref 0–0)

## 2013-02-10 LAB — COMPREHENSIVE METABOLIC PANEL (CC13)
ALT: 66 U/L — ABNORMAL HIGH (ref 0–55)
AST: 43 U/L — ABNORMAL HIGH (ref 5–34)
Albumin: 3.8 g/dL (ref 3.5–5.0)
Alkaline Phosphatase: 212 U/L — ABNORMAL HIGH (ref 40–150)
BUN: 9.9 mg/dL (ref 7.0–26.0)
CO2: 24 mEq/L (ref 22–29)
Calcium: 9.9 mg/dL (ref 8.4–10.4)
Chloride: 105 mEq/L (ref 98–109)
Creatinine: 0.7 mg/dL (ref 0.6–1.1)
Glucose: 113 mg/dl (ref 70–140)
Potassium: 4.1 mEq/L (ref 3.5–5.1)
Sodium: 141 mEq/L (ref 136–145)
Total Bilirubin: 0.27 mg/dL (ref 0.20–1.20)
Total Protein: 7.3 g/dL (ref 6.4–8.3)

## 2013-02-10 MED ORDER — SODIUM CHLORIDE 0.9 % IV SOLN: Freq: Once | INTRAVENOUS | Status: DC

## 2013-02-10 MED ORDER — DIPHENHYDRAMINE HCL 50 MG/ML IJ SOLN
50.0000 mg | Freq: Once | INTRAMUSCULAR | Status: AC
  Administered 2013-02-10: 50 mg via INTRAVENOUS

## 2013-02-10 MED ORDER — DEXAMETHASONE SODIUM PHOSPHATE 20 MG/5ML IJ SOLN
20.0000 mg | Freq: Once | INTRAMUSCULAR | Status: AC
  Administered 2013-02-10: 20 mg via INTRAVENOUS

## 2013-02-10 MED ORDER — FAMOTIDINE IN NACL 20-0.9 MG/50ML-% IV SOLN
20.0000 mg | Freq: Once | INTRAVENOUS | Status: AC
  Administered 2013-02-10: 20 mg via INTRAVENOUS

## 2013-02-10 MED ORDER — PROCHLORPERAZINE MALEATE 10 MG PO TABS: 10.0000 mg | ORAL_TABLET | Freq: Four times a day (QID) | ORAL | Status: DC | PRN

## 2013-02-10 MED ORDER — SODIUM CHLORIDE 0.9 % IV SOLN
248.4000 mg | Freq: Once | INTRAVENOUS | Status: AC
  Administered 2013-02-10: 250 mg via INTRAVENOUS
  Filled 2013-02-10: qty 25

## 2013-02-10 MED ORDER — GABAPENTIN 100 MG PO CAPS: 100.0000 mg | ORAL_CAPSULE | Freq: Three times a day (TID) | ORAL | Status: DC

## 2013-02-10 MED ORDER — PACLITAXEL CHEMO INJECTION 300 MG/50ML
80.0000 mg/m2 | Freq: Once | INTRAVENOUS | Status: AC
  Administered 2013-02-10: 132 mg via INTRAVENOUS
  Filled 2013-02-10: qty 22

## 2013-02-10 MED ORDER — SODIUM CHLORIDE 0.9 % IV SOLN
1000.0000 mL | Freq: Once | INTRAVENOUS | Status: DC
  Administered 2013-02-10: 1000 mL via INTRAVENOUS

## 2013-02-10 MED ORDER — ONDANSETRON 16 MG/50ML IVPB (CHCC)
16.0000 mg | Freq: Once | INTRAVENOUS | Status: AC
  Administered 2013-02-10: 16 mg via INTRAVENOUS

## 2013-02-10 MED ORDER — HEPARIN SOD (PORK) LOCK FLUSH 100 UNIT/ML IV SOLN
500.0000 [IU] | Freq: Once | INTRAVENOUS | Status: AC | PRN
  Administered 2013-02-10: 500 [IU]
  Filled 2013-02-10: qty 5

## 2013-02-10 MED ORDER — SODIUM CHLORIDE 0.9 % IJ SOLN
10.0000 mL | INTRAMUSCULAR | Status: DC | PRN
  Administered 2013-02-10: 10 mL
  Filled 2013-02-10: qty 10

## 2013-02-10 NOTE — Patient Instructions (Addendum)
Doing well.  Proceed with chemotherapy.  You will need to receive Neupogen x 4 days starting tomorrow.  Please call us if you have any questions or concerns.    Gabapentin capsules or tablets What is this medicine? GABAPENTIN (GA ba pen tin) is used to control partial seizures in adults with epilepsy. It is also used to treat certain types of nerve pain. This medicine may be used for other purposes; ask your health care provider or pharmacist if you have questions. What should I tell my health care provider before I take this medicine? They need to know if you have any of these conditions: -kidney disease -suicidal thoughts, plans, or attempt; a previous suicide attempt by you or a family member -an unusual or allergic reaction to gabapentin, other medicines, foods, dyes, or preservatives -pregnant or trying to get pregnant -breast-feeding How should I use this medicine? Take this medicine by mouth. Swallow it with a drink of water. Follow the directions on the prescription label. If this medicine upsets your stomach, take it with food or milk. Take your medicine at regular intervals. Do not take it more often than directed. If you are directed to break the 600 or 800 mg tablets in half as part of your dose, the extra half tablet should be used for the next dose. If you have not used the extra half tablet within 3 days, it should be thrown away. A special MedGuide will be given to you by the pharmacist with each prescription and refill. Be sure to read this information carefully each time. Talk to your pediatrician regarding the use of this medicine in children. Special care may be needed. Overdosage: If you think you have taken too much of this medicine contact a poison control center or emergency room at once. NOTE: This medicine is only for you. Do not share this medicine with others. What if I miss a dose? If you miss a dose, take it as soon as you can. If it is almost time for your next  dose, take only that dose. Do not take double or extra doses. What may interact with this medicine? -antacids -hydrocodone -morphine -naproxen -sevelamer This list may not describe all possible interactions. Give your health care provider a list of all the medicines, herbs, non-prescription drugs, or dietary supplements you use. Also tell them if you smoke, drink alcohol, or use illegal drugs. Some items may interact with your medicine. What should I watch for while using this medicine? Visit your doctor or health care professional for regular checks on your progress. You may want to keep a record at home of how you feel your condition is responding to treatment. You may want to share this information with your doctor or health care professional at each visit. You should contact your doctor or health care professional if your seizures get worse or if you have any new types of seizures. Do not stop taking this medicine or any of your seizure medicines unless instructed by your doctor or health care professional. Stopping your medicine suddenly can increase your seizures or their severity. Wear a medical identification bracelet or chain if you are taking this medicine for seizures, and carry a card that lists all your medications. You may get drowsy, dizzy, or have blurred vision. Do not drive, use machinery, or do anything that needs mental alertness until you know how this medicine affects you. To reduce dizzy or fainting spells, do not sit or stand up quickly, especially if you  are an older patient. Alcohol can increase drowsiness and dizziness. Avoid alcoholic drinks. Your mouth may get dry. Chewing sugarless gum or sucking hard candy, and drinking plenty of water will help. The use of this medicine may increase the chance of suicidal thoughts or actions. Pay special attention to how you are responding while on this medicine. Any worsening of mood, or thoughts of suicide or dying should be reported to  your health care professional right away. Women who become pregnant while using this medicine may enroll in the [place] American Antiepileptic Drug Pregnancy Registry by calling [phone]. This registry collects information about the safety of antiepileptic drug use during pregnancy. What side effects may I notice from receiving this medicine? Side effects that you should report to your doctor or health care professional as soon as possible: -allergic reactions like skin rash, itching or hives, swelling of the face, lips, or tongue -worsening of mood, thoughts or actions of suicide or dying Side effects that usually do not require medical attention (report to your doctor or health care professional if they continue or are bothersome): -constipation -difficulty walking or controlling muscle movements -nausea -slurred speech -tremors -weight gain This list may not describe all possible side effects. Call your doctor for medical advice about side effects. You may report side effects to FDA at 1-800-FDA-1088. Where should I keep my medicine? Keep out of reach of children. Store at room temperature between 15 and 30 degrees C (59 and 86 degrees F). Throw away any unused medicine after the expiration date. NOTE: This sheet is a summary. It may not cover all possible information. If you have questions about this medicine, talk to your doctor, pharmacist, or health care provider.  2012, Elsevier/Gold Standard. (02/28/2010 6:06:26 PM)

## 2013-02-10 NOTE — Patient Instructions (Addendum)
[facility] Discharge Instructions for Patients Receiving Chemotherapy  Today you received the following chemotherapy agents Taxol/Carboplatin. To help prevent nausea and vomiting after your treatment, we encourage you to take your nausea medication as prescribed.  If you develop nausea and vomiting that is not controlled by your nausea medication, call the clinic.  Do not drive as you received IV Benadryl. BELOW ARE SYMPTOMS THAT SHOULD BE REPORTED IMMEDIATELY:  *FEVER GREATER THAN 100.5 F  *CHILLS WITH OR WITHOUT FEVER  NAUSEA AND VOMITING THAT IS NOT CONTROLLED WITH YOUR NAUSEA MEDICATION  *UNUSUAL SHORTNESS OF BREATH  *UNUSUAL BRUISING OR BLEEDING  TENDERNESS IN MOUTH AND THROAT WITH OR WITHOUT PRESENCE OF ULCERS  *URINARY PROBLEMS  *BOWEL PROBLEMS  UNUSUAL RASH Items with * indicate a potential emergency and should be followed up as soon as possible.  Feel free to call the clinic you have any questions or concerns. The clinic phone number is [phone].

## 2013-02-10 NOTE — Progress Notes (Signed)
Patient states a friend will pick her up as she received 50 mg IV Benadryl.

## 2013-02-10 NOTE — Telephone Encounter (Signed)
appts made and printed. Pt is aware that there is a wait for 02/17/13 between her ov and tx. MW will try to move her up if there is a cancel...td

## 2013-02-10 NOTE — Telephone Encounter (Signed)
Per staff phone call and POF I have schedueld appts. For appt on 8/5 first available treatment is 1245pm after a 915 am MD visit, notified the scheduler that we will try to get her back ealier if we have cancel. JMW

## 2013-02-10 NOTE — Progress Notes (Signed)
OFFICE PROGRESS NOTE  CC  Nancy Mew, MD [address] [place] [address] Dr. Lurline Hare  Dr. Avel Peace  DIAGNOSIS: 57 year old female with new diagnosis of stage II a breast cancer that is triple negative    STAGE:  Cancer of lower-inner quadrant of female breast  Primary site: Breast (Right)  Staging method: AJCC 7th Edition  Clinical: Stage IIA (T2, N0, cM0)  Summary: Stage IIA (T2, N0, cM0)  PRIOR THERAPY: #1Patient's last mammogram was about 5 years ago. Recently she underwent a screening mammogram that showed a spiculated mass in the lower inner quadrant of the right breast. She had ultrasound performed that showed irregular mass at the 5:00 position 3 cm from the nipple measuring 1.1 cm. She had a needle core biopsy performed on 06/19/2012 the biopsy showed invasive ductal carcinoma with ductal carcinoma in situ grade 3 tumor was ER negative PR negative HER-2/neu negative. Ki-67 was elevated at 34%. She went on to have MRI of the breasts performed on 06/24/2012 the MRI showed in the middle third of the lower inner quadrant of the right breast a 1.5 x 1.3 x 1.9 cm irregular enhancing mass. No abnormal enhancement was seen in the left breast no enlarged axillary or internal mammary adenopathy was detected.   #2 patient is now status post bilateral mastectomies. On her right mastectomy she was found to have a 2.3 cm invasive ductal carcinoma 01 lymph nodes were positive for metastatic disease and this is T2 N0) stage II. Tumor was grade 3 ER 2% PR negative HER-2/neu negative with a Ki-67 at 34%. Left mastectomy only showed intraductal papilloma node atypia or malignancy was found.  #3 patient is now being considered for adjuvant chemotherapy since her tumor is essentially triple negative and is a stage II. We discussed the rationale for adjuvant chemotherapy. She will receive Adriamycin Cytoxan every 2 weeks for a total of 4 cycles with G-CSF support. Once  she completes this then she will receive Taxol and carboplatinum every week for a total of 12 weeks. I did discuss with her use of antiestrogen therapy such as an aromatase inhibitor since her tumor is 2% ER positive. I discussed the rationale for this.  CURRENT THERAPY: Taxol Carbo week 9  INTERVAL HISTORY: KEONDRA HAYDU 57 y.o. female returns for followup of her right breast cancer.  She continues to do well today.  She has a mild increase in the numbness in her fourth and fifth digit of her left hand.  Otherwise, she denies fevers, chills, nausea, vomiting, constipation, diarrhea, or any further concerns.  She tolerated the Neupogen well.  She is drinking fluids, but not 64 ounces per day.  Otherwise, a 10 point ROS is neg.   MEDICAL HISTORY: Past Medical History  Diagnosis Date  . Thyroid disease   . Depression   . Sprue   . Diverticulosis   . Hyperlipidemia   . Breast cancer     right  . Hypothyroidism   . Pneumonia     ALLERGIES:  is allergic to ciprofloxacin and gluten meal.  MEDICATIONS:  Current Outpatient Prescriptions  Medication Sig Dispense Refill  . acetaminophen (TYLENOL) 325 MG tablet Take 650 mg by mouth as needed.      . B Complex-C (SUPER B COMPLEX PO) Take 1 tablet by mouth daily.      [name] dexamethasone (DECADRON) 4 MG tablet       . levothyroxine (SYNTHROID, LEVOTHROID) 25 MCG tablet Take 25 mcg by mouth  daily.      . lidocaine-prilocaine (EMLA) cream Apply topically as needed.  30 g  8  . liothyronine (CYTOMEL) 25 MCG tablet Take 12.5 mcg by mouth daily.      . Multiple Vitamin (MULTIVITAMIN) tablet Take 1 tablet by mouth daily.      . ondansetron (ZOFRAN) 8 MG tablet Take 1 tablet (8 mg total) by mouth 2 (two) times daily. Take two times a day starting the day after chemo for 3 days. Then take two times a day as needed for nausea or vomiting.  30 tablet  1  . ondansetron (ZOFRAN) 8 MG tablet TAKE 1 TABLET BY MOUTH TWICE A DAY AS NEEDED (STARTING THE 3RD DAY  AFTER CHEMO)  30 tablet  1  . prochlorperazine (COMPAZINE) 10 MG tablet Take 1 tablet (10 mg total) by mouth every 6 (six) hours as needed.  30 tablet  2  . prochlorperazine (COMPAZINE) 25 MG suppository Place 1 suppository (25 mg total) rectally every 12 (twelve) hours as needed for nausea.  12 suppository  3  . valACYclovir (VALTREX) 500 MG tablet Take 1 tablet (500 mg total) by mouth 2 (two) times daily.  60 tablet  6  . venlafaxine XR (EFFEXOR-XR) 75 MG 24 hr capsule Take 1 capsule (75 mg total) by mouth daily.  90 capsule  3  . gabapentin (NEURONTIN) 100 MG capsule Take 1 capsule (100 mg total) by mouth 3 (three) times daily.  90 capsule  2   Current Facility-Administered Medications  Medication Dose Route Frequency Provider Last Rate Last Dose  . 0.9 %  sodium chloride infusion  1,000 mL Intravenous Once Augustin Schooling, NP        SURGICAL HISTORY:  Past Surgical History  Procedure Laterality Date  . Esophagogastroduodenoscopy  2007    sprue  . Colonoscopy  2006  . Cryoblation of cervix    . Breast lumpectomy    . Total mastectomy Bilateral 09/11/2012    Procedure: bilateral MASTECTOMY;  Surgeon: Adolph Pollack, MD;  Location: [facility] OR;  Service: General;  Laterality: Bilateral;  . Axillary sentinel node biopsy Right 09/11/2012    Procedure: AXILLARY SENTINEL lymph NODE  BIOPSY;  Surgeon: Adolph Pollack, MD;  Location: [facility] OR;  Service: General;  Laterality: Right;  right nuclear medicine injection 12:30   . Breast reconstruction with placement of tissue expander and flex hd (acellular hydrated dermis) Bilateral 09/11/2012    Procedure: BREAST RECONSTRUCTION WITH PLACEMENT OF TISSUE EXPANDER AND FLEX HD (ACELLULAR HYDRATED DERMIS) ADM;  Surgeon: Wayland Denis, DO;  Location: [facility] OR;  Service: Plastics;  Laterality: Bilateral;  . Incision and drainage of wound Left 09/16/2012    Procedure: Left Breast Evacuation of Hematoma;  Surgeon: Wayland Denis, DO;  Location: [facility] OR;  Service:  Plastics;  Laterality: Left;  . Portacath placement Right 10/09/2012    Procedure: US GUIDED INSERTION PORT-A-CATH;  Surgeon: Adolph Pollack, MD;  Location: [facility];  Service: General;  Laterality: Right;  Right Subclavian Vein    REVIEW OF SYSTEMS:   General: fatigue (+), night sweats (-), fever (-), pain (-) Lymph: palpable nodes (-) HEENT: vision changes (-), mucositis (-), gum bleeding (-), epistaxis (-) Cardiovascular: chest pain (-), palpitations (-) Pulmonary: shortness of breath (-), dyspnea on exertion (-), cough (-), hemoptysis (-) GI:  Early satiety (-), melena (-), dysphagia (-), nausea/vomiting (-), diarrhea (-) GU: dysuria (-), hematuria (-), incontinence (-) Musculoskeletal: joint swelling (-), joint pain (-), back pain (-)  Neuro: weakness (-), numbness (+), headache (-), confusion (-) Skin: Rash (-), lesions (-), dryness (-) Psych: depression (-), suicidal/homicidal ideation (-), feeling of hopelessness (-)   PHYSICAL EXAMINATION: Blood pressure 115/77, pulse 125, temperature 98.2 F (36.8 C), temperature source Oral, resp. rate 20, height 5\' 6"  (1.676 m), weight 142 lb 4.8 oz (64.547 kg), last menstrual period 09/11/2012. Body mass index is 22.98 kg/(m^2). General: Patient is a well appearing female in no acute distress HEENT: PERRLA, sclerae anicteric no conjunctival pallor, MMM Neck: supple, no palpable adenopathy Lungs: clear to auscultation bilaterally, no wheezes, rhonchi, or rales Cardiovascular: regular rate rhythm, S1, S2, no murmurs, rubs or gallops, HR recheck 110 Abdomen: Soft, non-tender, non-distended, normoactive bowel sounds, no HSM Extremities: warm and well perfused, no clubbing, cyanosis, or edema Skin: No rashes or lesions, scalp erythema appears to be a birthmark Neuro: Non-focal Breasts: bilateral mastectomy sites are well healed, no nodularity, masses, or skin changes, expanders in place. ECOG PERFORMANCE STATUS: 1 -  Symptomatic but completely ambulatory  LABORATORY DATA: Lab Results  Component Value Date   WBC 3.8* 02/10/2013   HGB 11.4* 02/10/2013   HCT 34.1* 02/10/2013   MCV 98.6 02/10/2013   PLT 249 02/10/2013      Chemistry      Component Value Date/Time   NA 137 02/03/2013 0832   NA 138 09/15/2012 2035   K 3.7 02/03/2013 0832   K 3.6 09/15/2012 2035   CL 105 01/06/2013 0827   CL 103 09/15/2012 2035   CO2 26 02/03/2013 0832   CO2 26 09/15/2012 2035   BUN 10.0 02/03/2013 0832   BUN 4* 09/15/2012 2035   CREATININE 0.7 02/03/2013 0832   CREATININE 0.54 09/15/2012 2035      Component Value Date/Time   CALCIUM 9.5 02/03/2013 0832   CALCIUM 9.0 09/15/2012 2035   ALKPHOS 217* 02/03/2013 0832   ALKPHOS 142* 09/15/2012 2035   AST 66* 02/03/2013 0832   AST 32 09/15/2012 2035   ALT 125* 02/03/2013 0832   ALT 24 09/15/2012 2035   BILITOT 0.38 02/03/2013 0832   BILITOT 0.8 09/15/2012 2035     ADDITIONAL INFORMATION: 3. CHROMOGENIC IN-SITU HYBRIDIZATION Interpretation HER-2/NEU BY CISH - NO AMPLIFICATION OF HER-2 DETECTED. THE RATIO OF HER-2: CEP 17 SIGNALS WAS 1.52. Reference range: Ratio: HER2:CEP17 < 1.8 - gene amplification not observed Ratio: HER2:CEP 17 1.8-2.2 - equivocal result Ratio: HER2:CEP17 > 2.2 - gene amplification observed Pecola Leisure MD Pathologist, Electronic Signature ( Signed 09/22/2012) 3. PROGNOSTIC INDICATORS - ACIS Results IMMUNOHISTOCHEMICAL AND MORPHOMETRIC ANALYSIS BY THE AUTOMATED CELLULAR IMAGING SYSTEM (ACIS) Estrogen Receptor (Negative, <1%): 2%, POSITIVE, WEAK STAINING INTENSITY Progesterone Receptor (Negative, <1%): 0%, NEGATIVE COMMENT: The negative hormone receptor study in this case has an internal positive control. All controls stained appropriately Pecola Leisure MD Pathologist, Electronic Signature ( Signed 09/22/2012) 1 of 4 FINAL for Ramcharan, Jenice A [phone]) FINAL DIAGNOSIS Diagnosis 1. Lymph node, sentinel, biopsy, Right axilla - ONE LYMPH NODE, NEGATIVE FOR  TUMOR (0/1). 2. Breast, simple mastectomy, Left - BENIGN BREAST TISSUE WITH INTRADUCTAL PAPILLOMA, SEE COMMENT. - NEGATIVE FOR ATYPIA OR MALIGNANCY. - SURGICAL MARGINS, NEGATIVE FOR ATYPIA OR MALIGNANCY. - MICROCALCIFICATIONS IDENTIFIED. 3. Breast, simple mastectomy, Right - INVASIVE DUCTAL CARCINOMA, GRADE III, WITH SPINDLE CELL DIFFERENTIATION (METAPLASTIC CARCINOMA) (2.3CM), SEE COMMENT. - NO LYMPHOVASCULAR INVASION IDENTIFIED. - INVASIVE TUMOR IS 1.5 CM FROM NEAREST MARGIN (DEEP). - DUCTAL CARCINOMA IN SITU, GRADE III, WITH COMEDONECROSIS. - SEE TUMOR SYNOPTIC TEMPLATE BELOW. Microscopic Comment 2. Although there  was no mass grossly identified, there are definitive morphologic features of intraductal papilloma with associated fibrocystic change present. In addition, there are fibrocystic changes with usual ductal hyperplasia in representative sections not involved by papilloma. Finally, foci of sclerosing adenosis and microcalcifications in benign ducts and lobules are present. The surgical resection margin(s) of the specimen were inked and microscopically evaluated. 3. BREAST, INVASIVE TUMOR, WITH LYMPH NODE SAMPLING Specimen, including laterality: Right breast. Procedure: Simple mastectomy. Grade: III of III. Tubule formation: 3. Nuclear pleomorphism: 3. Mitotic: 2. Tumor size (gross measurement): 2.3 cm. Margins: Invasive, distance to closest margin: 1.5 cm. In-situ, distance to closest margin: 1.5 cm (deep). If margin positive, focally or broadly: N/A. Lymphovascular invasion: Absent. Ductal carcinoma in situ: Present. Grade: III of III. Extensive intraductal component: Absent. Lobular neoplasia: Absent. Tumor focality: Unifocal. Treatment effect: None. If present, treatment effect in breast tissue, lymph nodes or both: N/A. Extent of tumor: Skin and Nipple: Grossly negative. Skeletal muscle: N/A. Lymph nodes: # examined: 1. Lymph nodes with metastasis: 0. Breast  prognostic profile: Estrogen receptor: Repeated; previous study demonstrates 0% positivity ([id number]). Progesterone receptor: Repeated; previous study demonstrates 0% positivity ([id number]). HER-2/neu: Repeated; previous study demonstrated no amplification (1.55) ([id number]). 2 of 4 FINAL for ELBERT, POLYAKOV A [phone]) Microscopic Comment(continued) Ki-67: Not repeated; previous study demonstrates 34% proliferation rate [id number]). Non-neoplastic breast: Intraductal papilloma with fibrocystic change and usual ductal hyperplasia, fibrocystic change with usual ductal hyperplasia, microcalcifications, and previous biopsy site. TNM: pT2, pN0, pMX. Comments: Although there are definitive features of high grade invasive ductal carcinoma identified, there are multiple foci where the carcinoma assumes a spindle cell morphology / spindle cell differentiation. 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 There are no heterologous elements identified. (CRR:eps 09/15/12)  RADIOGRAPHIC STUDIES:  Chest 2 View  09/08/2012  *RADIOLOGY REPORT*  Clinical Data: Right-sided breast  CHEST - 2 VIEW  Comparison: Chest x-ray 12/06/2009.  Findings: Lung volumes are normal.  No consolidative airspace disease.  No pleural effusions.  No pneumothorax.  No pulmonary nodule or mass noted.  Pulmonary vasculature and the cardiomediastinal silhouette are within normal limits.  Mild bilateral apical pleuroparenchymal thickening most compatible with chronic scarring, unchanged compared to the prior examination.  IMPRESSION: 1. No radiographic evidence of acute cardiopulmonary disease.   Original Report Authenticated By: Trudie Reed, M.D.    Nm Sentinel Node Inj-no Rpt (breast)  09/11/2012  CLINICAL DATA: Invasive right breast cancer   Sulfur colloid was injected intradermally by the nuclear medicine  technologist for breast cancer  sentinel node localization.      ASSESSMENT: 57 year old female with  #1 invasive ductal carcinoma of the right breast status post bilateral mastectomies for a 2.3 cm node negative essentially triple negative disease on the right. Without any evidence of malignancy on the left. Patient's a candidate for adjuvant chemotherapy. We discussed Adriamycin Cytoxan dose dense followed by weekly Taxol Carbo. Since her tumor was 2% estrogen receptor positive we will utilize an aromatase inhibitor for 5 years. She understands the rationale for this.  #2 patient is also having considerable anxiety and will continue to take Effexor daily.    #3 she also began Prilosec 20 mg daily for reflux.  #4 patient is slightly leukopenic and requires Neupogen x 4 days following chemotherapy.    PLAN:  #1 Doing well, labs are stable.  Patient will proceed with chemotherapy.  She will require Neupogen x 4 days following chemotherapy.  She will receive 1 L NS today as she is mildly dehydrated. I encouraged her to increase her PO intake of water.    #2 I prescribed Gabapentin 100mg  TID for her to take for the numbness.  Information on the medication was added to her AVS.    #3 She will return for the next four days for Neupogen, and next week for her next cycle of chemotherapy.    All questions were answered. The patient knows to call the clinic with any problems, questions or concerns. We can certainly see the patient much sooner if necessary.  I spent 25 minutes counseling the patient face to face. The total time spent in the appointment was 30 minutes.   Cherie Ouch Lyn Hollingshead, NP Medical Oncology [facility] Phone: [phone] 02/10/2013, 10:13 AM

## 2013-02-11 ENCOUNTER — Ambulatory Visit (HOSPITAL_BASED_OUTPATIENT_CLINIC_OR_DEPARTMENT_OTHER): Payer: Private Health Insurance - Indemnity

## 2013-02-11 VITALS — BP 116/77 | HR 117 | Temp 98.6°F

## 2013-02-11 DIAGNOSIS — Z5189 Encounter for other specified aftercare: Secondary | ICD-10-CM

## 2013-02-11 DIAGNOSIS — C50311 Malignant neoplasm of lower-inner quadrant of right female breast: Secondary | ICD-10-CM

## 2013-02-11 DIAGNOSIS — C50319 Malignant neoplasm of lower-inner quadrant of unspecified female breast: Secondary | ICD-10-CM

## 2013-02-11 MED ORDER — FILGRASTIM 480 MCG/0.8ML IJ SOLN
480.0000 ug | Freq: Every day | INTRAMUSCULAR | Status: DC
  Administered 2013-02-11: 480 ug via SUBCUTANEOUS
  Filled 2013-02-11: qty 0.8

## 2013-02-12 ENCOUNTER — Ambulatory Visit (HOSPITAL_BASED_OUTPATIENT_CLINIC_OR_DEPARTMENT_OTHER): Payer: Private Health Insurance - Indemnity

## 2013-02-12 VITALS — BP 113/69 | HR 107 | Temp 98.1°F

## 2013-02-12 DIAGNOSIS — C50311 Malignant neoplasm of lower-inner quadrant of right female breast: Secondary | ICD-10-CM

## 2013-02-12 DIAGNOSIS — C50319 Malignant neoplasm of lower-inner quadrant of unspecified female breast: Secondary | ICD-10-CM

## 2013-02-12 DIAGNOSIS — Z5189 Encounter for other specified aftercare: Secondary | ICD-10-CM

## 2013-02-12 MED ORDER — FILGRASTIM 480 MCG/0.8ML IJ SOLN
480.0000 ug | Freq: Every day | INTRAMUSCULAR | Status: DC
  Administered 2013-02-12: 480 ug via SUBCUTANEOUS
  Filled 2013-02-12: qty 0.8

## 2013-02-13 ENCOUNTER — Ambulatory Visit (HOSPITAL_BASED_OUTPATIENT_CLINIC_OR_DEPARTMENT_OTHER): Payer: Private Health Insurance - Indemnity

## 2013-02-13 ENCOUNTER — Encounter: Payer: Self-pay | Admitting: Oncology

## 2013-02-13 VITALS — BP 136/94 | HR 98 | Temp 98.1°F

## 2013-02-13 DIAGNOSIS — C50311 Malignant neoplasm of lower-inner quadrant of right female breast: Secondary | ICD-10-CM

## 2013-02-13 DIAGNOSIS — Z5189 Encounter for other specified aftercare: Secondary | ICD-10-CM

## 2013-02-13 DIAGNOSIS — C50319 Malignant neoplasm of lower-inner quadrant of unspecified female breast: Secondary | ICD-10-CM

## 2013-02-13 MED ORDER — FILGRASTIM 480 MCG/0.8ML IJ SOLN
480.0000 ug | Freq: Every day | INTRAMUSCULAR | Status: DC
  Administered 2013-02-13: 480 ug via SUBCUTANEOUS
  Filled 2013-02-13: qty 0.8

## 2013-02-13 NOTE — Progress Notes (Signed)
Faxed disability form to [employer] @ [phone] °

## 2013-02-13 NOTE — Patient Instructions (Addendum)
Filgrastim, G-CSF injection What is this medicine? FILGRASTIM, G-CSF (fil GRA stim) stimulates the formation of white blood cells. This medicine is given to patients with conditions that may cause a decrease in white blood cells, like those receiving certain types of chemotherapy or bone marrow transplant. It helps the bone marrow recover its ability to produce white blood cells. Increasing the amount of white blood cells helps to decrease the risk of infection and fever. This medicine may be used for other purposes; ask your health care provider or pharmacist if you have questions. What should I tell my health care provider before I take this medicine? They need to know if you have any of these conditions: -currently receiving radiation therapy -sickle cell disease -an unusual or allergic reaction to filgrastim, E. coli protein, other medicines, foods, dyes, or preservatives -pregnant or trying to get pregnant -breast-feeding How should I use this medicine? This medicine is for injection into a vein or injection under the skin. It is usually given by a health care professional in a hospital or clinic setting. If you get this medicine at home, you will be taught how to prepare and give this medicine. Always change the site for the injection under the skin. Let the solution warm to room temperature before you use it. Do not shake the solution before you withdraw a dose. Throw away any unused portion. Use exactly as directed. Take your medicine at regular intervals. Do not take your medicine more often than directed. It is important that you put your used needles and syringes in a special sharps container. Do not put them in a trash can. If you do not have a sharps container, call your pharmacist or healthcare provider to get one. Talk to your pediatrician regarding the use of this medicine in children. While this medicine may be prescribed for children for selected conditions, precautions do  apply. Overdosage: If you think you have taken too much of this medicine contact a poison control center or emergency room at once. NOTE: This medicine is only for you. Do not share this medicine with others. What if I miss a dose? Try not to miss doses. If you miss a dose take the dose as soon as you remember. If it is almost time for the next dose, do not take double doses unless told to by your doctor or health care professional. What may interact with this medicine? -lithium -medicines for cancer chemotherapy This list may not describe all possible interactions. Give your health care provider a list of all the medicines, herbs, non-prescription drugs, or dietary supplements you use. Also tell them if you smoke, drink alcohol, or use illegal drugs. Some items may interact with your medicine. What should I watch for while using this medicine? Visit your doctor or health care professional for regular checks on your progress. If you get a fever or any sign of infection while you are using this medicine, do not treat yourself. Check with your doctor or health care professional. Bone pain can usually be relieved by mild pain relievers such as acetaminophen or ibuprofen. Check with your doctor or health care professional before taking these medicines as they may hide a fever. Call your doctor or health care professional if the aches and pains are severe or do not go away. What side effects may I notice from receiving this medicine? Side effects that you should report to your doctor or health care professional as soon as possible: -allergic reactions like skin rash, itching   or hives, swelling of the face, lips, or tongue -difficulty breathing, wheezing -fever -pain, redness, or swelling at the injection site -stomach or side pain, or pain at the shoulder Side effects that usually do not require medical attention (report to your doctor or health care professional if they continue or are  bothersome): -bone pain (ribs, lower back, breast bone) -headache -skin rash This list may not describe all possible side effects. Call your doctor for medical advice about side effects. You may report side effects to FDA at 1-800-FDA-1088. Where should I keep my medicine? Keep out of the reach of children. Store in a refrigerator between 2 and 8 degrees C (36 and 46 degrees F). Do not freeze or leave in direct sunlight. If vials or syringes are left out of the refrigerator for more than 24 hours, they must be thrown away. Throw away unused vials after the expiration date on the carton. NOTE: This sheet is a summary. It may not cover all possible information. If you have questions about this medicine, talk to your doctor, pharmacist, or health care provider.  2013, [employer]. (09/17/2007 1:33:21 PM)  

## 2013-02-14 ENCOUNTER — Ambulatory Visit (HOSPITAL_BASED_OUTPATIENT_CLINIC_OR_DEPARTMENT_OTHER): Payer: Private Health Insurance - Indemnity

## 2013-02-14 VITALS — BP 117/80 | HR 90 | Temp 97.3°F

## 2013-02-14 DIAGNOSIS — C50319 Malignant neoplasm of lower-inner quadrant of unspecified female breast: Secondary | ICD-10-CM

## 2013-02-14 DIAGNOSIS — C50311 Malignant neoplasm of lower-inner quadrant of right female breast: Secondary | ICD-10-CM

## 2013-02-14 DIAGNOSIS — Z5189 Encounter for other specified aftercare: Secondary | ICD-10-CM

## 2013-02-14 MED ORDER — FILGRASTIM 480 MCG/0.8ML IJ SOLN
480.0000 ug | Freq: Every day | INTRAMUSCULAR | Status: DC
  Administered 2013-02-14: 480 ug via SUBCUTANEOUS

## 2013-02-17 ENCOUNTER — Ambulatory Visit (HOSPITAL_BASED_OUTPATIENT_CLINIC_OR_DEPARTMENT_OTHER): Payer: Private Health Insurance - Indemnity | Admitting: Adult Health

## 2013-02-17 ENCOUNTER — Encounter: Payer: Self-pay | Admitting: Adult Health

## 2013-02-17 ENCOUNTER — Other Ambulatory Visit (HOSPITAL_BASED_OUTPATIENT_CLINIC_OR_DEPARTMENT_OTHER): Payer: Private Health Insurance - Indemnity | Admitting: Lab

## 2013-02-17 ENCOUNTER — Telehealth: Payer: Self-pay | Admitting: Oncology

## 2013-02-17 ENCOUNTER — Ambulatory Visit (HOSPITAL_BASED_OUTPATIENT_CLINIC_OR_DEPARTMENT_OTHER): Payer: Private Health Insurance - Indemnity

## 2013-02-17 VITALS — BP 129/83 | HR 128 | Temp 98.3°F | Resp 20 | Ht 66.0 in | Wt 143.7 lb

## 2013-02-17 DIAGNOSIS — K219 Gastro-esophageal reflux disease without esophagitis: Secondary | ICD-10-CM

## 2013-02-17 DIAGNOSIS — C50319 Malignant neoplasm of lower-inner quadrant of unspecified female breast: Secondary | ICD-10-CM

## 2013-02-17 DIAGNOSIS — C50311 Malignant neoplasm of lower-inner quadrant of right female breast: Secondary | ICD-10-CM

## 2013-02-17 DIAGNOSIS — Z5111 Encounter for antineoplastic chemotherapy: Secondary | ICD-10-CM

## 2013-02-17 DIAGNOSIS — F411 Generalized anxiety disorder: Secondary | ICD-10-CM

## 2013-02-17 DIAGNOSIS — Z171 Estrogen receptor negative status [ER-]: Secondary | ICD-10-CM

## 2013-02-17 LAB — CBC WITH DIFFERENTIAL/PLATELET
BASO%: 1.7 % (ref 0.0–2.0)
Basophils Absolute: 0.1 10*3/uL (ref 0.0–0.1)
EOS%: 0.6 % (ref 0.0–7.0)
Eosinophils Absolute: 0 10*3/uL (ref 0.0–0.5)
HCT: 34 % — ABNORMAL LOW (ref 34.8–46.6)
HGB: 11.5 g/dL — ABNORMAL LOW (ref 11.6–15.9)
LYMPH%: 26.8 % (ref 14.0–49.7)
MCH: 33.4 pg (ref 25.1–34.0)
MCHC: 33.8 g/dL (ref 31.5–36.0)
MCV: 98.8 fL (ref 79.5–101.0)
MONO#: 0.7 10*3/uL (ref 0.1–0.9)
MONO%: 15 % — ABNORMAL HIGH (ref 0.0–14.0)
NEUT#: 2.6 10*3/uL (ref 1.5–6.5)
NEUT%: 55.9 % (ref 38.4–76.8)
Platelets: 253 10*3/uL (ref 145–400)
RBC: 3.44 10*6/uL — ABNORMAL LOW (ref 3.70–5.45)
RDW: 17.4 % — ABNORMAL HIGH (ref 11.2–14.5)
WBC: 4.7 10*3/uL (ref 3.9–10.3)
lymph#: 1.3 10*3/uL (ref 0.9–3.3)

## 2013-02-17 LAB — COMPREHENSIVE METABOLIC PANEL (CC13)
ALT: 58 U/L — ABNORMAL HIGH (ref 0–55)
AST: 34 U/L (ref 5–34)
Albumin: 3.7 g/dL (ref 3.5–5.0)
Alkaline Phosphatase: 223 U/L — ABNORMAL HIGH (ref 40–150)
BUN: 11.5 mg/dL (ref 7.0–26.0)
CO2: 24 mEq/L (ref 22–29)
Calcium: 9.7 mg/dL (ref 8.4–10.4)
Chloride: 105 mEq/L (ref 98–109)
Creatinine: 0.7 mg/dL (ref 0.6–1.1)
Glucose: 96 mg/dl (ref 70–140)
Potassium: 4.2 mEq/L (ref 3.5–5.1)
Sodium: 139 mEq/L (ref 136–145)
Total Bilirubin: 0.29 mg/dL (ref 0.20–1.20)
Total Protein: 7.2 g/dL (ref 6.4–8.3)

## 2013-02-17 MED ORDER — FAMOTIDINE IN NACL 20-0.9 MG/50ML-% IV SOLN
20.0000 mg | Freq: Once | INTRAVENOUS | Status: AC
  Administered 2013-02-17: 20 mg via INTRAVENOUS

## 2013-02-17 MED ORDER — ONDANSETRON 16 MG/50ML IVPB (CHCC)
16.0000 mg | Freq: Once | INTRAVENOUS | Status: AC
  Administered 2013-02-17: 16 mg via INTRAVENOUS

## 2013-02-17 MED ORDER — SODIUM CHLORIDE 0.9 % IJ SOLN
10.0000 mL | INTRAMUSCULAR | Status: DC | PRN
  Administered 2013-02-17: 10 mL
  Filled 2013-02-17: qty 10

## 2013-02-17 MED ORDER — SODIUM CHLORIDE 0.9 % IV SOLN
Freq: Once | INTRAVENOUS | Status: AC
  Administered 2013-02-17: 11:00:00 via INTRAVENOUS

## 2013-02-17 MED ORDER — PACLITAXEL CHEMO INJECTION 300 MG/50ML
80.0000 mg/m2 | Freq: Once | INTRAVENOUS | Status: AC
  Administered 2013-02-17: 132 mg via INTRAVENOUS
  Filled 2013-02-17: qty 22

## 2013-02-17 MED ORDER — DEXAMETHASONE SODIUM PHOSPHATE 20 MG/5ML IJ SOLN
20.0000 mg | Freq: Once | INTRAMUSCULAR | Status: AC
  Administered 2013-02-17: 20 mg via INTRAVENOUS

## 2013-02-17 MED ORDER — HEPARIN SOD (PORK) LOCK FLUSH 100 UNIT/ML IV SOLN
500.0000 [IU] | Freq: Once | INTRAVENOUS | Status: AC | PRN
  Administered 2013-02-17: 500 [IU]
  Filled 2013-02-17: qty 5

## 2013-02-17 MED ORDER — DIPHENHYDRAMINE HCL 50 MG/ML IJ SOLN
50.0000 mg | Freq: Once | INTRAMUSCULAR | Status: AC
  Administered 2013-02-17: 50 mg via INTRAVENOUS

## 2013-02-17 MED ORDER — SODIUM CHLORIDE 0.9 % IV SOLN
248.4000 mg | Freq: Once | INTRAVENOUS | Status: AC
  Administered 2013-02-17: 250 mg via INTRAVENOUS
  Filled 2013-02-17: qty 25

## 2013-02-17 NOTE — Patient Instructions (Addendum)
[facility] Discharge Instructions for Patients Receiving Chemotherapy  Today you received the following chemotherapy agents :  Taxol, Carboplatin.  To help prevent nausea and vomiting after your treatment, we encourage you to take your nausea medication as instructed by your physician.   If you develop nausea and vomiting that is not controlled by your nausea medication, call the clinic.   BELOW ARE SYMPTOMS THAT SHOULD BE REPORTED IMMEDIATELY:  *FEVER GREATER THAN 100.5 F  *CHILLS WITH OR WITHOUT FEVER  NAUSEA AND VOMITING THAT IS NOT CONTROLLED WITH YOUR NAUSEA MEDICATION  *UNUSUAL SHORTNESS OF BREATH  *UNUSUAL BRUISING OR BLEEDING  TENDERNESS IN MOUTH AND THROAT WITH OR WITHOUT PRESENCE OF ULCERS  *URINARY PROBLEMS  *BOWEL PROBLEMS  UNUSUAL RASH Items with * indicate a potential emergency and should be followed up as soon as possible.  Feel free to call the clinic you have any questions or concerns. The clinic phone number is [phone].    

## 2013-02-17 NOTE — Progress Notes (Signed)
OFFICE PROGRESS NOTE  CC  Nancy Mew, MD [address] [place] [address] Dr. Lurline Hare  Dr. 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 Recently she underwent Beard screening mammogram that showed Beard spiculated mass in the lower inner quadrant of the right breast. She had ultrasound performed that showed irregular mass at the 5:00 position 3 cm from the nipple measuring 1.1 cm. She had Beard needle core biopsy performed on 06/19/2012 the biopsy showed invasive ductal carcinoma with ductal carcinoma in situ grade 3 tumor was ER negative PR negative HER-2/neu negative. Ki-67 was elevated at 34%. She went on to have MRI of the breasts performed on 06/24/2012 the MRI showed in the middle third of the lower inner quadrant of the right breast Beard 1.5 x 1.3 x 1.9 cm irregular enhancing mass. No abnormal enhancement was seen in the left breast no enlarged axillary or internal mammary adenopathy was detected.   #2 patient is now status post bilateral mastectomies. On her right mastectomy she was found to have Beard 2.3 cm invasive ductal carcinoma 01 lymph nodes were positive for metastatic disease and this is T2 N0) stage II. Tumor was grade 3 ER 2% PR negative HER-2/neu negative with Beard Ki-67 at 34%. Left mastectomy only showed intraductal papilloma node atypia or malignancy was found.  #3 patient is now being considered for adjuvant chemotherapy since her tumor is essentially triple negative and is Beard stage II. We discussed the rationale for adjuvant chemotherapy. She will receive Adriamycin Cytoxan every 2 weeks for Beard total of 4 cycles with G-CSF support. Once  she completes this then she will receive Taxol and carboplatinum every week for Beard total of 12 weeks. I did discuss with her use of antiestrogen therapy such as an aromatase inhibitor since her tumor is 2% ER positive. I discussed the rationale for this.  CURRENT THERAPY: Taxol Carbo week 10  INTERVAL HISTORY: Nancy Beard 57 y.o. female returns for followup of her right breast cancer.  She continues to do well today.Her numbness is stable, and she is taking the neurontin 300mg  at bedtime as it helps her sleep.  She denies fevers, chills, chest pain, palpitations, shortness of breath, constipation, diarrhea, nausea, vomiting, or any further concerns.  Beard 10 point ROS is otherwise neg.   MEDICAL HISTORY: Past Medical History  Diagnosis Date  . Thyroid disease   . Depression   . Sprue   . Diverticulosis   . Hyperlipidemia   . Breast cancer     right  . Hypothyroidism   . Pneumonia     ALLERGIES:  is allergic to ciprofloxacin and gluten meal.  MEDICATIONS:  Current Outpatient Prescriptions  Medication Sig Dispense Refill  . acetaminophen (TYLENOL) 325 MG tablet Take 650 mg by mouth as needed.      . B Complex-C (SUPER B COMPLEX PO) Take 1 tablet by mouth daily.      [name] dexamethasone (DECADRON) 4 MG tablet       . gabapentin (NEURONTIN) 100 MG capsule Take 1 capsule (100 mg total) by mouth 3 (three) times daily.  90 capsule  2  . levothyroxine (  SYNTHROID, LEVOTHROID) 25 MCG tablet Take 25 mcg by mouth daily.      [name] lidocaine-prilocaine (EMLA) cream Apply topically as needed.  30 g  8  . liothyronine (CYTOMEL) 25 MCG tablet Take 12.5 mcg by mouth daily.      . Multiple Vitamin (MULTIVITAMIN) tablet Take 1 tablet by mouth daily.      . ondansetron (ZOFRAN) 8 MG tablet Take 1 tablet (8 mg total) by mouth 2 (two) times daily. Take two times Beard day starting the day after chemo for 3 days. Then take two times Beard day as needed for nausea or vomiting.  30 tablet  1  . prochlorperazine  (COMPAZINE) 10 MG tablet Take 1 tablet (10 mg total) by mouth every 6 (six) hours as needed.  30 tablet  2  . prochlorperazine (COMPAZINE) 25 MG suppository Place 1 suppository (25 mg total) rectally every 12 (twelve) hours as needed for nausea.  12 suppository  3  . valACYclovir (VALTREX) 500 MG tablet Take 1 tablet (500 mg total) by mouth 2 (two) times daily.  60 tablet  6  . venlafaxine XR (EFFEXOR-XR) 75 MG 24 hr capsule Take 1 capsule (75 mg total) by mouth daily.  90 capsule  3   No current facility-administered medications for this visit.    SURGICAL HISTORY:  Past Surgical History  Procedure Laterality Date  . Esophagogastroduodenoscopy  2007    sprue  . Colonoscopy  2006  . Cryoblation of cervix    . Breast lumpectomy    . Total mastectomy Bilateral 09/11/2012    Procedure: bilateral MASTECTOMY;  Surgeon: Adolph Pollack, MD;  Location: [facility] OR;  Service: General;  Laterality: Bilateral;  . Axillary sentinel node biopsy Right 09/11/2012    Procedure: AXILLARY SENTINEL lymph NODE  BIOPSY;  Surgeon: Adolph Pollack, MD;  Location: [facility] OR;  Service: General;  Laterality: Right;  right nuclear medicine injection 12:30   . Breast reconstruction with placement of tissue expander and flex hd (acellular hydrated dermis) Bilateral 09/11/2012    Procedure: BREAST RECONSTRUCTION WITH PLACEMENT OF TISSUE EXPANDER AND FLEX HD (ACELLULAR HYDRATED DERMIS) ADM;  Surgeon: Wayland Denis, DO;  Location: [facility] OR;  Service: Plastics;  Laterality: Bilateral;  . Incision and drainage of wound Left 09/16/2012    Procedure: Left Breast Evacuation of Hematoma;  Surgeon: Wayland Denis, DO;  Location: [facility] OR;  Service: Plastics;  Laterality: Left;  . Portacath placement Right 10/09/2012    Procedure: US GUIDED INSERTION PORT-Beard-CATH;  Surgeon: Adolph Pollack, MD;  Location: [facility];  Service: General;  Laterality: Right;  Right Subclavian Vein    REVIEW OF SYSTEMS:   General: fatigue (+), night  sweats (-), fever (-), pain (-) Lymph: palpable nodes (-) HEENT: vision changes (-), mucositis (-), gum bleeding (-), epistaxis (-) Cardiovascular: chest pain (-), palpitations (-) Pulmonary: shortness of breath (-), dyspnea on exertion (-), cough (-), hemoptysis (-) GI:  Early satiety (-), melena (-), dysphagia (-), nausea/vomiting (-), diarrhea (-) GU: dysuria (-), hematuria (-), incontinence (-) Musculoskeletal: joint swelling (-), joint pain (-), back pain (-) Neuro: weakness (-), numbness (+), headache (-), confusion (-) Skin: Rash (-), lesions (-), dryness (-) Psych: depression (-), suicidal/homicidal ideation (-), feeling of hopelessness (-)   PHYSICAL EXAMINATION: Blood pressure 129/83, pulse 128, temperature 98.3 F (36.8 C), temperature source Oral, resp. rate 20, height 5\' 6"  (1.676 m), weight 143 lb 11.2 oz (65.182 kg), last menstrual period 09/11/2012. Body mass index is 23.2  kg/(m^2). General: Patient is Beard well appearing female in no acute distress HEENT: PERRLA, sclerae anicteric no conjunctival pallor, MMM Neck: supple, no palpable adenopathy Lungs: clear to auscultation bilaterally, no wheezes, rhonchi, or rales Cardiovascular: regular rate rhythm, S1, S2, no murmurs, rubs or gallops, HR recheck 105 Abdomen: Soft, non-tender, non-distended, normoactive bowel sounds, no HSM Extremities: warm and well perfused, no clubbing, cyanosis, or edema Skin: No rashes or lesions, scalp erythema appears to be Beard birthmark Neuro: Non-focal Breasts: bilateral mastectomy sites are well healed, no nodularity, masses, or skin changes, expanders in place. ECOG PERFORMANCE STATUS: 1 - Symptomatic but completely ambulatory  LABORATORY DATA: Lab Results  Component Value Date   WBC 4.7 02/17/2013   HGB 11.5* 02/17/2013   HCT 34.0* 02/17/2013   MCV 98.8 02/17/2013   PLT 253 02/17/2013      Chemistry      Component Value Date/Time   NA 141 02/10/2013 0843   NA 138 09/15/2012 2035   K 4.1  02/10/2013 0843   K 3.6 09/15/2012 2035   CL 105 01/06/2013 0827   CL 103 09/15/2012 2035   CO2 24 02/10/2013 0843   CO2 26 09/15/2012 2035   BUN 9.9 02/10/2013 0843   BUN 4* 09/15/2012 2035   CREATININE 0.7 02/10/2013 0843   CREATININE 0.54 09/15/2012 2035      Component Value Date/Time   CALCIUM 9.9 02/10/2013 0843   CALCIUM 9.0 09/15/2012 2035   ALKPHOS 212* 02/10/2013 0843   ALKPHOS 142* 09/15/2012 2035   AST 43* 02/10/2013 0843   AST 32 09/15/2012 2035   ALT 66* 02/10/2013 0843   ALT 24 09/15/2012 2035   BILITOT 0.27 02/10/2013 0843   BILITOT 0.8 09/15/2012 2035     ADDITIONAL INFORMATION: 3. CHROMOGENIC IN-SITU HYBRIDIZATION Interpretation HER-2/NEU BY CISH - NO AMPLIFICATION OF HER-2 DETECTED. THE RATIO OF HER-2: CEP 17 SIGNALS WAS 1.52. Reference range: Ratio: HER2:CEP17 < 1.8 - gene amplification not observed Ratio: HER2:CEP 17 1.8-2.2 - equivocal result Ratio: HER2:CEP17 > 2.2 - gene amplification observed Pecola Leisure MD Pathologist, Electronic Signature ( Signed 09/22/2012) 3. PROGNOSTIC INDICATORS - ACIS Results IMMUNOHISTOCHEMICAL AND MORPHOMETRIC ANALYSIS BY THE AUTOMATED CELLULAR IMAGING SYSTEM (ACIS) Estrogen Receptor (Negative, <1%): 2%, POSITIVE, WEAK STAINING INTENSITY Progesterone Receptor (Negative, <1%): 0%, NEGATIVE COMMENT: The negative hormone receptor study in this case has an internal positive control. All controls stained appropriately Pecola Leisure MD Pathologist, Electronic Signature ( Signed 09/22/2012) 1 of 4 FINAL for Nancy Beard, Nancy Beard [phone]) FINAL DIAGNOSIS Diagnosis 1. Lymph node, sentinel, biopsy, Right axilla - ONE LYMPH NODE, NEGATIVE FOR TUMOR (0/1). 2. Breast, simple mastectomy, Left - BENIGN BREAST TISSUE WITH INTRADUCTAL PAPILLOMA, SEE COMMENT. - NEGATIVE FOR ATYPIA OR MALIGNANCY. - SURGICAL MARGINS, NEGATIVE FOR ATYPIA OR MALIGNANCY. - MICROCALCIFICATIONS IDENTIFIED. 3. Breast, simple mastectomy, Right - INVASIVE DUCTAL CARCINOMA, GRADE III,  WITH SPINDLE CELL DIFFERENTIATION (METAPLASTIC CARCINOMA) (2.3CM), SEE COMMENT. - NO LYMPHOVASCULAR INVASION IDENTIFIED. - INVASIVE TUMOR IS 1.5 CM FROM NEAREST MARGIN (DEEP). - DUCTAL CARCINOMA IN SITU, GRADE III, WITH COMEDONECROSIS. - SEE TUMOR SYNOPTIC TEMPLATE BELOW. Microscopic Comment 2. Although there was no mass grossly identified, there are definitive morphologic features of intraductal papilloma with associated fibrocystic change present. In addition, there are fibrocystic changes with usual ductal hyperplasia in representative sections not involved by papilloma. Finally, foci of sclerosing adenosis and microcalcifications in benign ducts and lobules are present. The surgical resection margin(s) of the specimen were inked and microscopically evaluated. 3. BREAST, INVASIVE TUMOR, WITH LYMPH  NODE SAMPLING Specimen, including laterality: Right breast. Procedure: Simple mastectomy. Grade: III of III. Tubule formation: 3. Nuclear pleomorphism: 3. Mitotic: 2. Tumor size (gross measurement): 2.3 cm. Margins: Invasive, distance to closest margin: 1.5 cm. In-situ, distance to closest margin: 1.5 cm (deep). If margin positive, focally or broadly: N/Beard. Lymphovascular invasion: Absent. Ductal carcinoma in situ: Present. Grade: III of III. Extensive intraductal component: Absent. Lobular neoplasia: Absent. Tumor focality: Unifocal. Treatment effect: None. If present, treatment effect in breast tissue, lymph nodes or both: N/Beard. Extent of tumor: Skin and Nipple: Grossly negative. Skeletal muscle: N/Beard. Lymph nodes: # examined: 1. Lymph nodes with metastasis: 0. Breast prognostic profile: Estrogen receptor: Repeated; previous study demonstrates 0% positivity ([id number]). Progesterone receptor: Repeated; previous study demonstrates 0% positivity ([id number]). HER-2/neu: Repeated; previous study demonstrated no amplification (1.55) ([id number]). 2 of 4 FINAL for Nancy Beard, Nancy Beard [id number]) Microscopic Comment(continued) Ki-67: Not repeated; previous study demonstrates 34% proliferation rate [phone]). Non-neoplastic breast: Intraductal papilloma with fibrocystic change and usual ductal hyperplasia, fibrocystic change with usual ductal hyperplasia, microcalcifications, and previous biopsy site. TNM: pT2, pN0, pMX. Comments: Although there are definitive features of high grade invasive ductal carcinoma identified, there are multiple foci where the carcinoma assumes Beard spindle cell morphology / spindle cell differentiation. 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 There are no heterologous elements identified. (CRR:eps 09/15/12)  RADIOGRAPHIC STUDIES:  Chest 2 View  09/08/2012  *RADIOLOGY REPORT*  Clinical Data: Right-sided breast  CHEST - 2 VIEW  Comparison: Chest x-ray 12/06/2009.  Findings: Lung volumes are normal.  No consolidative airspace disease.  No pleural effusions.  No pneumothorax.  No pulmonary nodule or mass noted.  Pulmonary vasculature and the cardiomediastinal silhouette are within normal limits.  Mild bilateral apical pleuroparenchymal thickening most compatible with chronic scarring, unchanged compared to the prior examination.  IMPRESSION: 1. No radiographic evidence of acute cardiopulmonary disease.   Original Report Authenticated By: Trudie Reed, M.D.    Nm Sentinel Node Inj-no Rpt (breast)  09/11/2012  CLINICAL DATA: Invasive right breast cancer   Sulfur colloid was injected intradermally by the nuclear medicine  technologist for breast cancer sentinel node localization.      ASSESSMENT: 57 year old female with  #1 invasive ductal carcinoma of the right breast status post bilateral mastectomies for Beard 2.3 cm node negative essentially triple negative disease on the right. Without any evidence of malignancy on the left. Patient's Beard candidate for adjuvant  chemotherapy. We discussed Adriamycin Cytoxan dose dense followed by weekly Taxol Carbo. Since her tumor was 2% estrogen receptor positive we will utilize an aromatase inhibitor for 5 years. She understands the rationale for this.  #2 patient is also having considerable anxiety and will continue to take Effexor daily.    #3 she also began Prilosec 20 mg daily for reflux.  PLAN:  #1 Doing well, labs are stable.  Patient will proceed with chemotherapy.  She does not need Neupogen this week.    #2 She has done well with the neurontin 300mg  QHS.  She will try to take 100 mg in the morning and afternoon and 300mg  at night, and will increase her morning and afternoon dose to 200mg  if needed.  She is agreeable with this plan.    #3 She will returnnext week for her next cycle of chemotherapy.    All questions were answered. The patient knows to call the clinic with any problems, questions or concerns. We can certainly  see the patient much sooner if necessary.  I spent 25 minutes counseling the patient face to face. The total time spent in the appointment was 30 minutes.   Cherie Ouch Lyn Hollingshead, NP Medical Oncology [facility] Phone: [phone] 02/17/2013, 10:00 AM

## 2013-02-17 NOTE — Patient Instructions (Signed)
Doing well.  Proceed with chemotherapy.  Take Neurontin 100mg  (one tablet) in the morning and afternoon, and 300mg  (3 tablets) at night.  After doing this for 2-3 days you can increase the Neurontin to 200mg  (two tablets) in the morning and afternoon, and 300mg  (3 tablets) at night.    We will see you back next week.  Please call us if you have any questions or concerns.

## 2013-02-18 ENCOUNTER — Encounter: Payer: Self-pay | Admitting: Internal Medicine

## 2013-02-18 ENCOUNTER — Ambulatory Visit (INDEPENDENT_AMBULATORY_CARE_PROVIDER_SITE_OTHER): Payer: Private Health Insurance - Indemnity | Admitting: Internal Medicine

## 2013-02-18 VITALS — BP 120/82 | HR 92 | Temp 98.2°F | Resp 16 | Ht 66.0 in | Wt 144.0 lb

## 2013-02-18 DIAGNOSIS — E039 Hypothyroidism, unspecified: Secondary | ICD-10-CM

## 2013-02-18 NOTE — Progress Notes (Signed)
  Subjective:    Patient ID: Nancy Beard, female    DOB: 1955/12/18, 57 y.o.   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  HENT: Negative for ear pain, congestion, neck pain, postnasal drip and sinus pressure.   Eyes: Negative for redness and visual disturbance.  Respiratory: Negative for cough, shortness of breath and wheezing.   Gastrointestinal: Negative for abdominal pain and abdominal distention.  Genitourinary: Negative for dysuria, frequency and menstrual problem.  Musculoskeletal: Negative for myalgias, joint swelling and arthralgias.  Skin: Negative for rash and wound.       alopecia  Neurological: Negative for dizziness, weakness and headaches.  Hematological: Negative for adenopathy. Does not bruise/bleed easily.  Psychiatric/Behavioral: Negative for sleep disturbance and decreased concentration.       Objective:   Physical Exam  Nursing note and vitals reviewed. Constitutional: She is oriented to person, place, and time. She appears well-developed and well-nourished. No distress.  HENT:  Head: Normocephalic and atraumatic.  alopecia  Eyes: Conjunctivae and EOM are normal. Pupils are equal, round, and reactive to light.  Neck: Normal range of motion. Neck supple. No JVD present. No tracheal deviation present. No thyromegaly present.  Cardiovascular: Normal rate and regular rhythm.   Murmur heard. Pulmonary/Chest: Effort normal and breath sounds normal. She has no wheezes. She exhibits no tenderness.  Abdominal: Soft. Bowel sounds are normal.  Musculoskeletal: Normal range of motion. She exhibits no edema and no tenderness.  Lymphadenopathy:    She has no cervical adenopathy.  Neurological: She is alert and oriented to person, place, and  time. She has normal reflexes. No cranial nerve deficit.  Skin: Skin is warm and dry. She is not diaphoretic.  Psychiatric: She has a normal mood and affect. Her behavior is normal.          Assessment & Plan:  Breast cancer and completing chemo Hair loss Have not had thyroid

## 2013-02-18 NOTE — Patient Instructions (Signed)
Notify lab when you get your blood work done at the cancer center that I've added a TSH it should be under lad collect and  should be able to order it

## 2013-02-24 ENCOUNTER — Encounter: Payer: Self-pay | Admitting: Adult Health

## 2013-02-24 ENCOUNTER — Ambulatory Visit (HOSPITAL_BASED_OUTPATIENT_CLINIC_OR_DEPARTMENT_OTHER): Payer: Private Health Insurance - Indemnity

## 2013-02-24 ENCOUNTER — Other Ambulatory Visit (HOSPITAL_BASED_OUTPATIENT_CLINIC_OR_DEPARTMENT_OTHER): Payer: Private Health Insurance - Indemnity | Admitting: Lab

## 2013-02-24 ENCOUNTER — Ambulatory Visit (HOSPITAL_BASED_OUTPATIENT_CLINIC_OR_DEPARTMENT_OTHER): Payer: Private Health Insurance - Indemnity | Admitting: Adult Health

## 2013-02-24 VITALS — BP 120/82 | HR 135 | Temp 98.3°F | Resp 20 | Ht 66.0 in | Wt 145.0 lb

## 2013-02-24 DIAGNOSIS — G609 Hereditary and idiopathic neuropathy, unspecified: Secondary | ICD-10-CM

## 2013-02-24 DIAGNOSIS — R000 Tachycardia, unspecified: Secondary | ICD-10-CM

## 2013-02-24 DIAGNOSIS — C50319 Malignant neoplasm of lower-inner quadrant of unspecified female breast: Secondary | ICD-10-CM

## 2013-02-24 DIAGNOSIS — Z170 Estrogen receptor positive status [ER+]: Secondary | ICD-10-CM

## 2013-02-24 DIAGNOSIS — C50311 Malignant neoplasm of lower-inner quadrant of right female breast: Secondary | ICD-10-CM

## 2013-02-24 DIAGNOSIS — Z9010 Acquired absence of unspecified breast and nipple: Secondary | ICD-10-CM

## 2013-02-24 DIAGNOSIS — G4700 Insomnia, unspecified: Secondary | ICD-10-CM

## 2013-02-24 DIAGNOSIS — F411 Generalized anxiety disorder: Secondary | ICD-10-CM

## 2013-02-24 DIAGNOSIS — E860 Dehydration: Secondary | ICD-10-CM

## 2013-02-24 DIAGNOSIS — Z5111 Encounter for antineoplastic chemotherapy: Secondary | ICD-10-CM

## 2013-02-24 LAB — CBC WITH DIFFERENTIAL/PLATELET
BASO%: 0.3 % (ref 0.0–2.0)
Basophils Absolute: 0 10*3/uL (ref 0.0–0.1)
EOS%: 0.6 % (ref 0.0–7.0)
Eosinophils Absolute: 0 10*3/uL (ref 0.0–0.5)
HCT: 34.2 % — ABNORMAL LOW (ref 34.8–46.6)
HGB: 11.6 g/dL (ref 11.6–15.9)
LYMPH%: 28.1 % (ref 14.0–49.7)
MCH: 33.3 pg (ref 25.1–34.0)
MCHC: 33.9 g/dL (ref 31.5–36.0)
MCV: 98.3 fL (ref 79.5–101.0)
MONO#: 0.3 10*3/uL (ref 0.1–0.9)
MONO%: 10.1 % (ref 0.0–14.0)
NEUT#: 1.9 10*3/uL (ref 1.5–6.5)
NEUT%: 60.9 % (ref 38.4–76.8)
Platelets: 253 10*3/uL (ref 145–400)
RBC: 3.48 10*6/uL — ABNORMAL LOW (ref 3.70–5.45)
RDW: 16.7 % — ABNORMAL HIGH (ref 11.2–14.5)
WBC: 3.2 10*3/uL — ABNORMAL LOW (ref 3.9–10.3)
lymph#: 0.9 10*3/uL (ref 0.9–3.3)
nRBC: 0 % (ref 0–0)

## 2013-02-24 LAB — COMPREHENSIVE METABOLIC PANEL (CC13)
ALT: 56 U/L — ABNORMAL HIGH (ref 0–55)
AST: 47 U/L — ABNORMAL HIGH (ref 5–34)
Albumin: 3.6 g/dL (ref 3.5–5.0)
Alkaline Phosphatase: 157 U/L — ABNORMAL HIGH (ref 40–150)
BUN: 13.4 mg/dL (ref 7.0–26.0)
CO2: 24 mEq/L (ref 22–29)
Calcium: 9.3 mg/dL (ref 8.4–10.4)
Chloride: 107 mEq/L (ref 98–109)
Creatinine: 0.7 mg/dL (ref 0.6–1.1)
Glucose: 114 mg/dl (ref 70–140)
Potassium: 4 mEq/L (ref 3.5–5.1)
Sodium: 140 mEq/L (ref 136–145)
Total Bilirubin: 0.26 mg/dL (ref 0.20–1.20)
Total Protein: 7 g/dL (ref 6.4–8.3)

## 2013-02-24 MED ORDER — SODIUM CHLORIDE 0.9 % IV SOLN
248.4000 mg | Freq: Once | INTRAVENOUS | Status: AC
  Administered 2013-02-24: 250 mg via INTRAVENOUS
  Filled 2013-02-24: qty 25

## 2013-02-24 MED ORDER — DIPHENHYDRAMINE HCL 50 MG/ML IJ SOLN
50.0000 mg | Freq: Once | INTRAMUSCULAR | Status: AC
  Administered 2013-02-24: 50 mg via INTRAVENOUS

## 2013-02-24 MED ORDER — PACLITAXEL CHEMO INJECTION 300 MG/50ML
80.0000 mg/m2 | Freq: Once | INTRAVENOUS | Status: AC
  Administered 2013-02-24: 132 mg via INTRAVENOUS
  Filled 2013-02-24: qty 22

## 2013-02-24 MED ORDER — ONDANSETRON 16 MG/50ML IVPB (CHCC)
16.0000 mg | Freq: Once | INTRAVENOUS | Status: AC
  Administered 2013-02-24: 16 mg via INTRAVENOUS

## 2013-02-24 MED ORDER — FAMOTIDINE IN NACL 20-0.9 MG/50ML-% IV SOLN
20.0000 mg | Freq: Once | INTRAVENOUS | Status: AC
  Administered 2013-02-24: 20 mg via INTRAVENOUS

## 2013-02-24 MED ORDER — HEPARIN SOD (PORK) LOCK FLUSH 100 UNIT/ML IV SOLN
500.0000 [IU] | Freq: Once | INTRAVENOUS | Status: AC | PRN
  Administered 2013-02-24: 500 [IU]
  Filled 2013-02-24: qty 5

## 2013-02-24 MED ORDER — SODIUM CHLORIDE 0.9 % IV SOLN
Freq: Once | INTRAVENOUS | Status: AC
  Administered 2013-02-24: 12:00:00 via INTRAVENOUS

## 2013-02-24 MED ORDER — SODIUM CHLORIDE 0.9 % IV SOLN
Freq: Once | INTRAVENOUS | Status: AC
  Administered 2013-02-24: 11:00:00 via INTRAVENOUS

## 2013-02-24 MED ORDER — DEXAMETHASONE SODIUM PHOSPHATE 20 MG/5ML IJ SOLN
20.0000 mg | Freq: Once | INTRAMUSCULAR | Status: AC
  Administered 2013-02-24: 20 mg via INTRAVENOUS

## 2013-02-24 MED ORDER — SODIUM CHLORIDE 0.9 % IJ SOLN
10.0000 mL | INTRAMUSCULAR | Status: DC | PRN
  Administered 2013-02-24: 10 mL
  Filled 2013-02-24: qty 10

## 2013-02-24 NOTE — Patient Instructions (Addendum)
Doing well.  Proceed with chemotherapy.  Take Gabapentin 200mg  in the morning and afternoon for 3 days, then increase to 300mg  in the morning and afternoon.  Continue to take 300mg  at night.  We will see you back next week for your final treatment.  Please call us if you have any questions or concerns.

## 2013-02-24 NOTE — Patient Instructions (Signed)
[facility] Discharge Instructions for Patients Receiving Chemotherapy  Today you received the following chemotherapy agents Taxol/Carboplatin  To help prevent nausea and vomiting after your treatment, we encourage you to take your nausea medication as directed.   If you develop nausea and vomiting that is not controlled by your nausea medication, call the clinic.   BELOW ARE SYMPTOMS THAT SHOULD BE REPORTED IMMEDIATELY:  *FEVER GREATER THAN 100.5 F  *CHILLS WITH OR WITHOUT FEVER  NAUSEA AND VOMITING THAT IS NOT CONTROLLED WITH YOUR NAUSEA MEDICATION  *UNUSUAL SHORTNESS OF BREATH  *UNUSUAL BRUISING OR BLEEDING  TENDERNESS IN MOUTH AND THROAT WITH OR WITHOUT PRESENCE OF ULCERS  *URINARY PROBLEMS  *BOWEL PROBLEMS  UNUSUAL RASH Items with * indicate a potential emergency and should be followed up as soon as possible.  Feel free to call the clinic you have any questions or concerns. The clinic phone number is [phone].    

## 2013-02-24 NOTE — Progress Notes (Addendum)
OFFICE PROGRESS NOTE  CC  Nancy Mew, MD [address] [place] [address] Dr. Lurline Hare  Dr. 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 Recently she underwent Beard screening mammogram that showed Beard spiculated mass in the lower inner quadrant of the right breast. She had ultrasound performed that showed irregular mass at the 5:00 position 3 cm from the nipple measuring 1.1 cm. She had Beard needle core biopsy performed on 06/19/2012 the biopsy showed invasive ductal carcinoma with ductal carcinoma in situ grade 3 tumor was ER negative PR negative HER-2/neu negative. Ki-67 was elevated at 34%. She went on to have MRI of the breasts performed on 06/24/2012 the MRI showed in the middle third of the lower inner quadrant of the right breast Beard 1.5 x 1.3 x 1.9 cm irregular enhancing mass. No abnormal enhancement was seen in the left breast no enlarged axillary or internal mammary adenopathy was detected.   #2 patient is now status post bilateral mastectomies. On her right mastectomy she was found to have Beard 2.3 cm invasive ductal carcinoma 01 lymph nodes were positive for metastatic disease and this is T2 N0) stage II. Tumor was grade 3 ER 2% PR negative HER-2/neu negative with Beard Ki-67 at 34%. Left mastectomy only showed intraductal papilloma node atypia or malignancy was found.  #3 patient is now being considered for adjuvant chemotherapy since her tumor is essentially triple negative and is Beard stage II. We discussed the rationale for adjuvant chemotherapy. She will receive Adriamycin Cytoxan every 2 weeks for Beard total of 4 cycles with G-CSF support. Once  she completes this then she will receive Taxol and carboplatinum every week for Beard total of 12 weeks. I did discuss with her use of antiestrogen therapy such as an aromatase inhibitor since her tumor is 2% ER positive. I discussed the rationale for this.  CURRENT THERAPY: Taxol Carbo week 11  INTERVAL HISTORY: Nancy Beard 57 y.o. female returns for followup of her right breast cancer.  She is having difficulty sleeping at night, for two nights she has not slept well at all.  She continues to have numbness in her fingertips and it is slightly worse this week.  She is taking the Gabapentin n100mg  in the morning, 100mg  in the afternoon and 300mg  at bedtime.  She has mild nausea and takes anti-emetics that resolve it.  Otherwise she denies fevers, chills, vomiting, diarrhea.  She has had mild constipation that is controlled with diet rich in leafy vegetables.  Otherwise Beard 10 point ROS is negative.    MEDICAL HISTORY: Past Medical History  Diagnosis Date  . Thyroid disease   . Depression   . Sprue   . Diverticulosis   . Hyperlipidemia   . Breast cancer     right  . Hypothyroidism   . Pneumonia     ALLERGIES:  is allergic to ciprofloxacin and gluten meal.  MEDICATIONS:  Current Outpatient Prescriptions  Medication Sig Dispense Refill  . acetaminophen (TYLENOL) 325 MG tablet Take 650 mg by mouth as needed.      . B Complex-C (SUPER B COMPLEX PO) Take 1 tablet by mouth  daily.      . gabapentin (NEURONTIN) 100 MG capsule Take 1 capsule (100 mg total) by mouth 3 (three) times daily.  90 capsule  2  . levothyroxine (SYNTHROID, LEVOTHROID) 25 MCG tablet Take 25 mcg by mouth daily.      [name] lidocaine-prilocaine (EMLA) cream Apply topically as needed.  30 g  8  . liothyronine (CYTOMEL) 25 MCG tablet Take 12.5 mcg by mouth daily.      . Multiple Vitamin (MULTIVITAMIN) tablet Take 1 tablet by mouth daily.      . ondansetron (ZOFRAN) 8 MG tablet Take 1 tablet (8 mg total) by mouth 2 (two) times  daily. Take two times Beard day starting the day after chemo for 3 days. Then take two times Beard day as needed for nausea or vomiting.  30 tablet  1  . prochlorperazine (COMPAZINE) 25 MG suppository Place 1 suppository (25 mg total) rectally every 12 (twelve) hours as needed for nausea.  12 suppository  3  . valACYclovir (VALTREX) 500 MG tablet Take 1 tablet (500 mg total) by mouth 2 (two) times daily.  60 tablet  6  . venlafaxine XR (EFFEXOR-XR) 75 MG 24 hr capsule Take 1 capsule (75 mg total) by mouth daily.  90 capsule  3   No current facility-administered medications for this visit.    SURGICAL HISTORY:  Past Surgical History  Procedure Laterality Date  . Esophagogastroduodenoscopy  2007    sprue  . Colonoscopy  2006  . Cryoblation of cervix    . Breast lumpectomy    . Total mastectomy Bilateral 09/11/2012    Procedure: bilateral MASTECTOMY;  Surgeon: Adolph Pollack, MD;  Location: [facility] OR;  Service: General;  Laterality: Bilateral;  . Axillary sentinel node biopsy Right 09/11/2012    Procedure: AXILLARY SENTINEL lymph NODE  BIOPSY;  Surgeon: Adolph Pollack, MD;  Location: [facility] OR;  Service: General;  Laterality: Right;  right nuclear medicine injection 12:30   . Breast reconstruction with placement of tissue expander and flex hd (acellular hydrated dermis) Bilateral 09/11/2012    Procedure: BREAST RECONSTRUCTION WITH PLACEMENT OF TISSUE EXPANDER AND FLEX HD (ACELLULAR HYDRATED DERMIS) ADM;  Surgeon: Wayland Denis, DO;  Location: [facility] OR;  Service: Plastics;  Laterality: Bilateral;  . Incision and drainage of wound Left 09/16/2012    Procedure: Left Breast Evacuation of Hematoma;  Surgeon: Wayland Denis, DO;  Location: [facility] OR;  Service: Plastics;  Laterality: Left;  . Portacath placement Right 10/09/2012    Procedure: US GUIDED INSERTION PORT-Beard-CATH;  Surgeon: Adolph Pollack, MD;  Location: [facility];  Service: General;  Laterality: Right;  Right Subclavian Vein    REVIEW OF  SYSTEMS:   General: fatigue (+), night sweats (-), fever (-), pain (-) Lymph: palpable nodes (-) HEENT: vision changes (-), mucositis (-), gum bleeding (-), epistaxis (-) Cardiovascular: chest pain (-), palpitations (-) Pulmonary: shortness of breath (-), dyspnea on exertion (-), cough (-), hemoptysis (-) GI:  Early satiety (-), melena (-), dysphagia (-), nausea/vomiting (-), diarrhea (-) GU: dysuria (-), hematuria (-), incontinence (-) Musculoskeletal: joint swelling (-), joint pain (-), back pain (-) Neuro: weakness (-), numbness (+), headache (-), confusion (-) Skin: Rash (-), lesions (-), dryness (-) Psych: depression (-), suicidal/homicidal ideation (-), feeling of hopelessness (-)   PHYSICAL EXAMINATION: Blood pressure 120/82, pulse 135, temperature 98.3 F (36.8 C), temperature source Oral, resp. rate 20, height 5\' 6"  (1.676 m), weight 145 lb (65.772 kg), last menstrual period 09/11/2012. Body  mass index is 23.41 kg/(m^2). General: Patient is Beard well appearing female in no acute distress HEENT: PERRLA, sclerae anicteric no conjunctival pallor, MMM Neck: supple, no palpable adenopathy Lungs: clear to auscultation bilaterally, no wheezes, rhonchi, or rales Cardiovascular: regular rate rhythm, S1, S2, no murmurs, rubs or gallops, HR recheck 105 Abdomen: Soft, non-tender, non-distended, normoactive bowel sounds, no HSM Extremities: warm and well perfused, no clubbing, cyanosis, or edema Skin: No rashes or lesions, scalp erythema appears to be Beard birthmark Neuro: Non-focal Breasts: bilateral mastectomy sites are well healed, no nodularity, masses, or skin changes, expanders in place. ECOG PERFORMANCE STATUS: 1 - Symptomatic but completely ambulatory  LABORATORY DATA: Lab Results  Component Value Date   WBC 3.2* 02/24/2013   HGB 11.6 02/24/2013   HCT 34.2* 02/24/2013   MCV 98.3 02/24/2013   PLT 253 02/24/2013      Chemistry      Component Value Date/Time   NA 139 02/17/2013 0904    NA 138 09/15/2012 2035   K 4.2 02/17/2013 0904   K 3.6 09/15/2012 2035   CL 105 01/06/2013 0827   CL 103 09/15/2012 2035   CO2 24 02/17/2013 0904   CO2 26 09/15/2012 2035   BUN 11.5 02/17/2013 0904   BUN 4* 09/15/2012 2035   CREATININE 0.7 02/17/2013 0904   CREATININE 0.54 09/15/2012 2035      Component Value Date/Time   CALCIUM 9.7 02/17/2013 0904   CALCIUM 9.0 09/15/2012 2035   ALKPHOS 223* 02/17/2013 0904   ALKPHOS 142* 09/15/2012 2035   AST 34 02/17/2013 0904   AST 32 09/15/2012 2035   ALT 58* 02/17/2013 0904   ALT 24 09/15/2012 2035   BILITOT 0.29 02/17/2013 0904   BILITOT 0.8 09/15/2012 2035     ADDITIONAL INFORMATION: 3. CHROMOGENIC IN-SITU HYBRIDIZATION Interpretation HER-2/NEU BY CISH - NO AMPLIFICATION OF HER-2 DETECTED. THE RATIO OF HER-2: CEP 17 SIGNALS WAS 1.52. Reference range: Ratio: HER2:CEP17 < 1.8 - gene amplification not observed Ratio: HER2:CEP 17 1.8-2.2 - equivocal result Ratio: HER2:CEP17 > 2.2 - gene amplification observed Pecola Leisure MD Pathologist, Electronic Signature ( Signed 09/22/2012) 3. PROGNOSTIC INDICATORS - ACIS Results IMMUNOHISTOCHEMICAL AND MORPHOMETRIC ANALYSIS BY THE AUTOMATED CELLULAR IMAGING SYSTEM (ACIS) Estrogen Receptor (Negative, <1%): 2%, POSITIVE, WEAK STAINING INTENSITY Progesterone Receptor (Negative, <1%): 0%, NEGATIVE COMMENT: The negative hormone receptor study in this case has an internal positive control. All controls stained appropriately Pecola Leisure MD Pathologist, Electronic Signature ( Signed 09/22/2012) 1 of 4 FINAL for Beard, Nancy Beard [phone]) FINAL DIAGNOSIS Diagnosis 1. Lymph node, sentinel, biopsy, Right axilla - ONE LYMPH NODE, NEGATIVE FOR TUMOR (0/1). 2. Breast, simple mastectomy, Left - BENIGN BREAST TISSUE WITH INTRADUCTAL PAPILLOMA, SEE COMMENT. - NEGATIVE FOR ATYPIA OR MALIGNANCY. - SURGICAL MARGINS, NEGATIVE FOR ATYPIA OR MALIGNANCY. - MICROCALCIFICATIONS IDENTIFIED. 3. Breast, simple mastectomy, Right - INVASIVE DUCTAL  CARCINOMA, GRADE III, WITH SPINDLE CELL DIFFERENTIATION (METAPLASTIC CARCINOMA) (2.3CM), SEE COMMENT. - NO LYMPHOVASCULAR INVASION IDENTIFIED. - INVASIVE TUMOR IS 1.5 CM FROM NEAREST MARGIN (DEEP). - DUCTAL CARCINOMA IN SITU, GRADE III, WITH COMEDONECROSIS. - SEE TUMOR SYNOPTIC TEMPLATE BELOW. Microscopic Comment 2. Although there was no mass grossly identified, there are definitive morphologic features of intraductal papilloma with associated fibrocystic change present. In addition, there are fibrocystic changes with usual ductal hyperplasia in representative sections not involved by papilloma. Finally, foci of sclerosing adenosis and microcalcifications in benign ducts and lobules are present. The surgical resection margin(s) of the specimen were inked and microscopically evaluated. 3. BREAST,  INVASIVE TUMOR, WITH LYMPH NODE SAMPLING Specimen, including laterality: Right breast. Procedure: Simple mastectomy. Grade: III of III. Tubule formation: 3. Nuclear pleomorphism: 3. Mitotic: 2. Tumor size (gross measurement): 2.3 cm. Margins: Invasive, distance to closest margin: 1.5 cm. In-situ, distance to closest margin: 1.5 cm (deep). If margin positive, focally or broadly: N/Beard. Lymphovascular invasion: Absent. Ductal carcinoma in situ: Present. Grade: III of III. Extensive intraductal component: Absent. Lobular neoplasia: Absent. Tumor focality: Unifocal. Treatment effect: None. If present, treatment effect in breast tissue, lymph nodes or both: N/Beard. Extent of tumor: Skin and Nipple: Grossly negative. Skeletal muscle: N/Beard. Lymph nodes: # examined: 1. Lymph nodes with metastasis: 0. Breast prognostic profile: Estrogen receptor: Repeated; previous study demonstrates 0% positivity ([id number]). Progesterone receptor: Repeated; previous study demonstrates 0% positivity ([id number]). HER-2/neu: Repeated; previous study demonstrated no amplification (1.55) ([id number]). 2 of  4 FINAL for Nancy Beard, Nancy Beard [phone]) Microscopic Comment(continued) Ki-67: Not repeated; previous study demonstrates 34% proliferation rate [phone]). Non-neoplastic breast: Intraductal papilloma with fibrocystic change and usual ductal hyperplasia, fibrocystic change with usual ductal hyperplasia, microcalcifications, and previous biopsy site. TNM: pT2, pN0, pMX. Comments: Although there are definitive features of high grade invasive ductal carcinoma identified, there are multiple foci where the carcinoma assumes Beard spindle cell morphology / spindle cell differentiation. 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 There are no heterologous elements identified. (CRR:eps 09/15/12)  RADIOGRAPHIC STUDIES:  Chest 2 View  09/08/2012  *RADIOLOGY REPORT*  Clinical Data: Right-sided breast  CHEST - 2 VIEW  Comparison: Chest x-ray 12/06/2009.  Findings: Lung volumes are normal.  No consolidative airspace disease.  No pleural effusions.  No pneumothorax.  No pulmonary nodule or mass noted.  Pulmonary vasculature and the cardiomediastinal silhouette are within normal limits.  Mild bilateral apical pleuroparenchymal thickening most compatible with chronic scarring, unchanged compared to the prior examination.  IMPRESSION: 1. No radiographic evidence of acute cardiopulmonary disease.   Original Report Authenticated By: Trudie Reed, M.D.    Nm Sentinel Node Inj-no Rpt (breast)  09/11/2012  CLINICAL DATA: Invasive right breast cancer   Sulfur colloid was injected intradermally by the nuclear medicine  technologist for breast cancer sentinel node localization.      ASSESSMENT: 57 year old female with  #1 invasive ductal carcinoma of the right breast status post bilateral mastectomies for Beard 2.3 cm node negative essentially triple negative disease on the right. Without any evidence of malignancy on the left. Patient's Beard  candidate for adjuvant chemotherapy. We discussed Adriamycin Cytoxan dose dense followed by weekly Taxol Carbo. Since her tumor was 2% estrogen receptor positive we will utilize an aromatase inhibitor for 5 years. She understands the rationale for this.  She will receive cycle 11 Taxol/Carbo today.    #2 patient is also having considerable anxiety and will continue to take Effexor daily.    #3 she also began Prilosec 20 mg daily for reflux.  PLAN:   1. Doing well.  Proceed with chemotherapy.  She is mildly tachycardic today and endorses decreased fluid intake.  We will give her IV fluids today as well.      2.  She is having increasing neuropathy and will take Neurontin 200mg  in the morning and afternoon, and 300mg  at night for 3 days, then increase to 300mg  TID.  She is in agreement with this plan.    3. She will return in 1 week for labs, appt and her final treatment.    All  questions were answered. The patient knows to call the clinic with any problems, questions or concerns. We can certainly see the patient much sooner if necessary.  I spent 25 minutes counseling the patient face to face. The total time spent in the appointment was 30 minutes.   Cherie Ouch Lyn Hollingshead, NP Medical Oncology [facility] Phone: [phone] 02/24/2013, 9:50 AM

## 2013-02-25 ENCOUNTER — Telehealth: Payer: Self-pay | Admitting: *Deleted

## 2013-02-25 NOTE — Telephone Encounter (Signed)
Pt called stating that she needed to make sure we had all the paperwork we needed for her to be out of work during the time she is going thru chemo and follows up with Dr. Welton Flakes.  Informed her that Karel Jarvis handles that and I would get this message to her.  Called and left message for Karel Jarvis to make her aware that the pt was in question if we had everything she needed.

## 2013-02-26 ENCOUNTER — Encounter: Payer: Self-pay | Admitting: Oncology

## 2013-02-26 NOTE — Progress Notes (Signed)
Put [employer] disability form on nurse's desk. °

## 2013-03-03 ENCOUNTER — Other Ambulatory Visit (HOSPITAL_BASED_OUTPATIENT_CLINIC_OR_DEPARTMENT_OTHER): Payer: Private Health Insurance - Indemnity | Admitting: Lab

## 2013-03-03 ENCOUNTER — Ambulatory Visit (HOSPITAL_BASED_OUTPATIENT_CLINIC_OR_DEPARTMENT_OTHER): Payer: Private Health Insurance - Indemnity

## 2013-03-03 ENCOUNTER — Ambulatory Visit (HOSPITAL_BASED_OUTPATIENT_CLINIC_OR_DEPARTMENT_OTHER): Payer: Private Health Insurance - Indemnity | Admitting: Adult Health

## 2013-03-03 ENCOUNTER — Encounter: Payer: Self-pay | Admitting: Adult Health

## 2013-03-03 VITALS — BP 119/80 | HR 106 | Temp 98.6°F | Resp 20 | Ht 65.0 in | Wt 146.9 lb

## 2013-03-03 DIAGNOSIS — C50319 Malignant neoplasm of lower-inner quadrant of unspecified female breast: Secondary | ICD-10-CM

## 2013-03-03 DIAGNOSIS — Z171 Estrogen receptor negative status [ER-]: Secondary | ICD-10-CM

## 2013-03-03 DIAGNOSIS — Z5111 Encounter for antineoplastic chemotherapy: Secondary | ICD-10-CM

## 2013-03-03 DIAGNOSIS — F411 Generalized anxiety disorder: Secondary | ICD-10-CM

## 2013-03-03 DIAGNOSIS — C50311 Malignant neoplasm of lower-inner quadrant of right female breast: Secondary | ICD-10-CM

## 2013-03-03 DIAGNOSIS — G589 Mononeuropathy, unspecified: Secondary | ICD-10-CM

## 2013-03-03 LAB — COMPREHENSIVE METABOLIC PANEL (CC13)
ALT: 67 U/L — ABNORMAL HIGH (ref 0–55)
AST: 35 U/L — ABNORMAL HIGH (ref 5–34)
Albumin: 3.5 g/dL (ref 3.5–5.0)
Alkaline Phosphatase: 160 U/L — ABNORMAL HIGH (ref 40–150)
BUN: 9.1 mg/dL (ref 7.0–26.0)
CO2: 24 mEq/L (ref 22–29)
Calcium: 9.1 mg/dL (ref 8.4–10.4)
Chloride: 107 mEq/L (ref 98–109)
Creatinine: 0.6 mg/dL (ref 0.6–1.1)
Glucose: 98 mg/dl (ref 70–140)
Potassium: 4.3 mEq/L (ref 3.5–5.1)
Sodium: 140 mEq/L (ref 136–145)
Total Bilirubin: 0.38 mg/dL (ref 0.20–1.20)
Total Protein: 6.6 g/dL (ref 6.4–8.3)

## 2013-03-03 LAB — CBC WITH DIFFERENTIAL/PLATELET
BASO%: 0.4 % (ref 0.0–2.0)
Basophils Absolute: 0 10*3/uL (ref 0.0–0.1)
EOS%: 1.2 % (ref 0.0–7.0)
Eosinophils Absolute: 0 10*3/uL (ref 0.0–0.5)
HCT: 32.1 % — ABNORMAL LOW (ref 34.8–46.6)
HGB: 10.8 g/dL — ABNORMAL LOW (ref 11.6–15.9)
LYMPH%: 33.2 % (ref 14.0–49.7)
MCH: 32.7 pg (ref 25.1–34.0)
MCHC: 33.6 g/dL (ref 31.5–36.0)
MCV: 97.3 fL (ref 79.5–101.0)
MONO#: 0.5 10*3/uL (ref 0.1–0.9)
MONO%: 18.6 % — ABNORMAL HIGH (ref 0.0–14.0)
NEUT#: 1.2 10*3/uL — ABNORMAL LOW (ref 1.5–6.5)
NEUT%: 46.6 % (ref 38.4–76.8)
Platelets: 177 10*3/uL (ref 145–400)
RBC: 3.3 10*6/uL — ABNORMAL LOW (ref 3.70–5.45)
RDW: 16.5 % — ABNORMAL HIGH (ref 11.2–14.5)
WBC: 2.5 10*3/uL — ABNORMAL LOW (ref 3.9–10.3)
lymph#: 0.8 10*3/uL — ABNORMAL LOW (ref 0.9–3.3)
nRBC: 0 % (ref 0–0)

## 2013-03-03 MED ORDER — DIPHENHYDRAMINE HCL 50 MG/ML IJ SOLN
50.0000 mg | Freq: Once | INTRAMUSCULAR | Status: AC
  Administered 2013-03-03: 50 mg via INTRAVENOUS

## 2013-03-03 MED ORDER — FAMOTIDINE IN NACL 20-0.9 MG/50ML-% IV SOLN
20.0000 mg | Freq: Once | INTRAVENOUS | Status: AC
  Administered 2013-03-03: 20 mg via INTRAVENOUS

## 2013-03-03 MED ORDER — PACLITAXEL CHEMO INJECTION 300 MG/50ML
80.0000 mg/m2 | Freq: Once | INTRAVENOUS | Status: AC
  Administered 2013-03-03: 132 mg via INTRAVENOUS
  Filled 2013-03-03: qty 22

## 2013-03-03 MED ORDER — CARBOPLATIN CHEMO INJECTION 450 MG/45ML
248.4000 mg | Freq: Once | INTRAVENOUS | Status: AC
  Administered 2013-03-03: 250 mg via INTRAVENOUS
  Filled 2013-03-03: qty 25

## 2013-03-03 MED ORDER — ONDANSETRON 16 MG/50ML IVPB (CHCC)
16.0000 mg | Freq: Once | INTRAVENOUS | Status: AC
  Administered 2013-03-03: 16 mg via INTRAVENOUS

## 2013-03-03 MED ORDER — DEXAMETHASONE SODIUM PHOSPHATE 20 MG/5ML IJ SOLN
20.0000 mg | Freq: Once | INTRAMUSCULAR | Status: AC
  Administered 2013-03-03: 20 mg via INTRAVENOUS

## 2013-03-03 MED ORDER — HEPARIN SOD (PORK) LOCK FLUSH 100 UNIT/ML IV SOLN
500.0000 [IU] | Freq: Once | INTRAVENOUS | Status: AC | PRN
  Administered 2013-03-03: 500 [IU]
  Filled 2013-03-03: qty 5

## 2013-03-03 MED ORDER — SODIUM CHLORIDE 0.9 % IJ SOLN
10.0000 mL | INTRAMUSCULAR | Status: DC | PRN
  Administered 2013-03-03: 10 mL
  Filled 2013-03-03: qty 10

## 2013-03-03 MED ORDER — SODIUM CHLORIDE 0.9 % IV SOLN
Freq: Once | INTRAVENOUS | Status: AC
  Administered 2013-03-03: 11:00:00 via INTRAVENOUS

## 2013-03-03 NOTE — Patient Instructions (Addendum)
[facility] Discharge Instructions for Patients Receiving Chemotherapy  Today you received the following chemotherapy agents Carbo/Taxol  To help prevent nausea and vomiting after your treatment, we encourage you to take your nausea medication as directed    If you develop nausea and vomiting that is not controlled by your nausea medication, call the clinic.   BELOW ARE SYMPTOMS THAT SHOULD BE REPORTED IMMEDIATELY:  *FEVER GREATER THAN 100.5 F  *CHILLS WITH OR WITHOUT FEVER  NAUSEA AND VOMITING THAT IS NOT CONTROLLED WITH YOUR NAUSEA MEDICATION  *UNUSUAL SHORTNESS OF BREATH  *UNUSUAL BRUISING OR BLEEDING  TENDERNESS IN MOUTH AND THROAT WITH OR WITHOUT PRESENCE OF ULCERS  *URINARY PROBLEMS  *BOWEL PROBLEMS  UNUSUAL RASH Items with * indicate a potential emergency and should be followed up as soon as possible.  Feel free to call the clinic you have any questions or concerns. The clinic phone number is [phone].    

## 2013-03-03 NOTE — Progress Notes (Signed)
OFFICE PROGRESS NOTE  CC  Nancy Mew, MD [address] [place] [address] Dr. Lurline Hare  Dr. 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 Recently she underwent Beard screening mammogram that showed Beard spiculated mass in the lower inner quadrant of the right breast. She had ultrasound performed that showed irregular mass at the 5:00 position 3 cm from the nipple measuring 1.1 cm. She had Beard needle core biopsy performed on 06/19/2012 the biopsy showed invasive ductal carcinoma with ductal carcinoma in situ grade 3 tumor was ER negative PR negative HER-2/neu negative. Ki-67 was elevated at 34%. She went on to have MRI of the breasts performed on 06/24/2012 the MRI showed in the middle third of the lower inner quadrant of the right breast Beard 1.5 x 1.3 x 1.9 cm irregular enhancing mass. No abnormal enhancement was seen in the left breast no enlarged axillary or internal mammary adenopathy was detected.   #2 patient is now status post bilateral mastectomies. On her right mastectomy she was found to have Beard 2.3 cm invasive ductal carcinoma 01 lymph nodes were positive for metastatic disease and this is T2 N0) stage II. Tumor was grade 3 ER 2% PR negative HER-2/neu negative with Beard Ki-67 at 34%. Left mastectomy only showed intraductal papilloma node atypia or malignancy was found.  #3 patient is now being considered for adjuvant chemotherapy since her tumor is essentially triple negative and is Beard stage II. We discussed the rationale for adjuvant chemotherapy. She will receive Adriamycin Cytoxan every 2 weeks for Beard total of 4 cycles with G-CSF support. Once  she completes this then she will receive Taxol and carboplatinum every week for Beard total of 12 weeks. I did discuss with her use of antiestrogen therapy such as an aromatase inhibitor since her tumor is 2% ER positive. I discussed the rationale for this.  CURRENT THERAPY: Taxol Carbo week 12  INTERVAL HISTORY: Nancy Beard 57 y.o. female returns for followup of her right breast cancer.  She is doing well today and is very excited about her last treatment.  Her numbness remains stable with the Neurontin TID.  She denies fevers, chills, nausea, vomiting, constipation, diarrhea, or any further concerns.  Beard 10 point ROS is negative.   MEDICAL HISTORY: Past Medical History  Diagnosis Date  . Thyroid disease   . Depression   . Sprue   . Diverticulosis   . Hyperlipidemia   . Breast cancer     right  . Hypothyroidism   . Pneumonia     ALLERGIES:  is allergic to ciprofloxacin and gluten meal.  MEDICATIONS:  Current Outpatient Prescriptions  Medication Sig Dispense Refill  . acetaminophen (TYLENOL) 325 MG tablet Take 650 mg by mouth as needed.      . B Complex-C (SUPER B COMPLEX PO) Take 1 tablet by mouth daily.      [name] gabapentin (NEURONTIN) 100 MG capsule Take 1 capsule (100 mg total) by mouth 3 (three) times daily.  90 capsule  2  . levothyroxine (SYNTHROID, LEVOTHROID) 25 MCG tablet Take 25 mcg by mouth daily.      [name] lidocaine-prilocaine (EMLA) cream  Apply topically as needed.  30 g  8  . liothyronine (CYTOMEL) 25 MCG tablet Take 12.5 mcg by mouth daily.      . Multiple Vitamin (MULTIVITAMIN) tablet Take 1 tablet by mouth daily.      . ondansetron (ZOFRAN) 8 MG tablet Take 1 tablet (8 mg total) by mouth 2 (two) times daily. Take two times Beard day starting the day after chemo for 3 days. Then take two times Beard day as needed for nausea or vomiting.  30 tablet  1  . prochlorperazine (COMPAZINE) 10 MG tablet       . prochlorperazine (COMPAZINE) 25 MG suppository Place 1 suppository (25 mg  total) rectally every 12 (twelve) hours as needed for nausea.  12 suppository  3  . valACYclovir (VALTREX) 500 MG tablet Take 1 tablet (500 mg total) by mouth 2 (two) times daily.  60 tablet  6  . venlafaxine XR (EFFEXOR-XR) 75 MG 24 hr capsule Take 1 capsule (75 mg total) by mouth daily.  90 capsule  3   No current facility-administered medications for this visit.    SURGICAL HISTORY:  Past Surgical History  Procedure Laterality Date  . Esophagogastroduodenoscopy  2007    sprue  . Colonoscopy  2006  . Cryoblation of cervix    . Breast lumpectomy    . Total mastectomy Bilateral 09/11/2012    Procedure: bilateral MASTECTOMY;  Surgeon: Adolph Pollack, MD;  Location: [facility] OR;  Service: General;  Laterality: Bilateral;  . Axillary sentinel node biopsy Right 09/11/2012    Procedure: AXILLARY SENTINEL lymph NODE  BIOPSY;  Surgeon: Adolph Pollack, MD;  Location: [facility] OR;  Service: General;  Laterality: Right;  right nuclear medicine injection 12:30   . Breast reconstruction with placement of tissue expander and flex hd (acellular hydrated dermis) Bilateral 09/11/2012    Procedure: BREAST RECONSTRUCTION WITH PLACEMENT OF TISSUE EXPANDER AND FLEX HD (ACELLULAR HYDRATED DERMIS) ADM;  Surgeon: Wayland Denis, DO;  Location: [facility] OR;  Service: Plastics;  Laterality: Bilateral;  . Incision and drainage of wound Left 09/16/2012    Procedure: Left Breast Evacuation of Hematoma;  Surgeon: Wayland Denis, DO;  Location: [facility] OR;  Service: Plastics;  Laterality: Left;  . Portacath placement Right 10/09/2012    Procedure: US GUIDED INSERTION PORT-Beard-CATH;  Surgeon: Adolph Pollack, MD;  Location: [facility];  Service: General;  Laterality: Right;  Right Subclavian Vein    REVIEW OF SYSTEMS:   General: fatigue (+), night sweats (-), fever (-), pain (-) Lymph: palpable nodes (-) HEENT: vision changes (-), mucositis (-), gum bleeding (-), epistaxis (-) Cardiovascular: chest pain (-), palpitations  (-) Pulmonary: shortness of breath (-), dyspnea on exertion (-), cough (-), hemoptysis (-) GI:  Early satiety (-), melena (-), dysphagia (-), nausea/vomiting (-), diarrhea (-) GU: dysuria (-), hematuria (-), incontinence (-) Musculoskeletal: joint swelling (-), joint pain (-), back pain (-) Neuro: weakness (-), numbness (+), headache (-), confusion (-) Skin: Rash (-), lesions (-), dryness (-) Psych: depression (-), suicidal/homicidal ideation (-), feeling of hopelessness (-)   PHYSICAL EXAMINATION: Blood pressure 119/80, pulse 106, temperature 98.6 F (37 C), temperature source Oral, resp. rate 20, height 5\' 5"  (1.651 m), weight 146 lb 14.4 oz (66.633 kg), last menstrual period 09/11/2012. Body mass index is 24.45 kg/(m^2). General: Patient is Beard well appearing female in no acute distress HEENT: PERRLA, sclerae anicteric no conjunctival pallor, MMM Neck: supple, no palpable adenopathy Lungs: clear to auscultation bilaterally, no wheezes, rhonchi, or  rales Cardiovascular: regular rate rhythm, S1, S2, no murmurs, rubs or gallops, HR recheck 105 Abdomen: Soft, non-tender, non-distended, normoactive bowel sounds, no HSM Extremities: warm and well perfused, no clubbing, cyanosis, or edema Skin: No rashes or lesions, scalp erythema appears to be Beard birthmark Neuro: Non-focal Breasts: bilateral mastectomy sites are well healed, no nodularity, masses, or skin changes, expanders in place. ECOG PERFORMANCE STATUS: 1 - Symptomatic but completely ambulatory  LABORATORY DATA: Lab Results  Component Value Date   WBC 2.5* 03/03/2013   HGB 10.8* 03/03/2013   HCT 32.1* 03/03/2013   MCV 97.3 03/03/2013   PLT 177 03/03/2013      Chemistry      Component Value Date/Time   NA 140 02/24/2013 0920   NA 138 09/15/2012 2035   K 4.0 02/24/2013 0920   K 3.6 09/15/2012 2035   CL 105 01/06/2013 0827   CL 103 09/15/2012 2035   CO2 24 02/24/2013 0920   CO2 26 09/15/2012 2035   BUN 13.4 02/24/2013 0920   BUN 4* 09/15/2012  2035   CREATININE 0.7 02/24/2013 0920   CREATININE 0.54 09/15/2012 2035      Component Value Date/Time   CALCIUM 9.3 02/24/2013 0920   CALCIUM 9.0 09/15/2012 2035   ALKPHOS 157* 02/24/2013 0920   ALKPHOS 142* 09/15/2012 2035   AST 47* 02/24/2013 0920   AST 32 09/15/2012 2035   ALT 56* 02/24/2013 0920   ALT 24 09/15/2012 2035   BILITOT 0.26 02/24/2013 0920   BILITOT 0.8 09/15/2012 2035     ADDITIONAL INFORMATION: 3. CHROMOGENIC IN-SITU HYBRIDIZATION Interpretation HER-2/NEU BY CISH - NO AMPLIFICATION OF HER-2 DETECTED. THE RATIO OF HER-2: CEP 17 SIGNALS WAS 1.52. Reference range: Ratio: HER2:CEP17 < 1.8 - gene amplification not observed Ratio: HER2:CEP 17 1.8-2.2 - equivocal result Ratio: HER2:CEP17 > 2.2 - gene amplification observed Pecola Leisure MD Pathologist, Electronic Signature ( Signed 09/22/2012) 3. PROGNOSTIC INDICATORS - ACIS Results IMMUNOHISTOCHEMICAL AND MORPHOMETRIC ANALYSIS BY THE AUTOMATED CELLULAR IMAGING SYSTEM (ACIS) Estrogen Receptor (Negative, <1%): 2%, POSITIVE, WEAK STAINING INTENSITY Progesterone Receptor (Negative, <1%): 0%, NEGATIVE COMMENT: The negative hormone receptor study in this case has an internal positive control. All controls stained appropriately Pecola Leisure MD Pathologist, Electronic Signature ( Signed 09/22/2012) 1 of 4 FINAL for Nancy Beard, Nancy Beard [phone]) FINAL DIAGNOSIS Diagnosis 1. Lymph node, sentinel, biopsy, Right axilla - ONE LYMPH NODE, NEGATIVE FOR TUMOR (0/1). 2. Breast, simple mastectomy, Left - BENIGN BREAST TISSUE WITH INTRADUCTAL PAPILLOMA, SEE COMMENT. - NEGATIVE FOR ATYPIA OR MALIGNANCY. - SURGICAL MARGINS, NEGATIVE FOR ATYPIA OR MALIGNANCY. - MICROCALCIFICATIONS IDENTIFIED. 3. Breast, simple mastectomy, Right - INVASIVE DUCTAL CARCINOMA, GRADE III, WITH SPINDLE CELL DIFFERENTIATION (METAPLASTIC CARCINOMA) (2.3CM), SEE COMMENT. - NO LYMPHOVASCULAR INVASION IDENTIFIED. - INVASIVE TUMOR IS 1.5 CM FROM NEAREST MARGIN  (DEEP). - DUCTAL CARCINOMA IN SITU, GRADE III, WITH COMEDONECROSIS. - SEE TUMOR SYNOPTIC TEMPLATE BELOW. Microscopic Comment 2. Although there was no mass grossly identified, there are definitive morphologic features of intraductal papilloma with associated fibrocystic change present. In addition, there are fibrocystic changes with usual ductal hyperplasia in representative sections not involved by papilloma. Finally, foci of sclerosing adenosis and microcalcifications in benign ducts and lobules are present. The surgical resection margin(s) of the specimen were inked and microscopically evaluated. 3. BREAST, INVASIVE TUMOR, WITH LYMPH NODE SAMPLING Specimen, including laterality: Right breast. Procedure: Simple mastectomy. Grade: III of III. Tubule formation: 3. Nuclear pleomorphism: 3. Mitotic: 2. Tumor size (gross measurement): 2.3 cm. Margins: Invasive, distance to closest margin:  1.5 cm. In-situ, distance to closest margin: 1.5 cm (deep). If margin positive, focally or broadly: N/Beard. Lymphovascular invasion: Absent. Ductal carcinoma in situ: Present. Grade: III of III. Extensive intraductal component: Absent. Lobular neoplasia: Absent. Tumor focality: Unifocal. Treatment effect: None. If present, treatment effect in breast tissue, lymph nodes or both: N/Beard. Extent of tumor: Skin and Nipple: Grossly negative. Skeletal muscle: N/Beard. Lymph nodes: # examined: 1. Lymph nodes with metastasis: 0. Breast prognostic profile: Estrogen receptor: Repeated; previous study demonstrates 0% positivity ([id number]). Progesterone receptor: Repeated; previous study demonstrates 0% positivity ([id number]). HER-2/neu: Repeated; previous study demonstrated no amplification (1.55) ([id number]). 2 of 4 FINAL for Nancy Beard, Nancy Beard [phone]) Microscopic Comment(continued) Ki-67: Not repeated; previous study demonstrates 34% proliferation rate [phone]). Non-neoplastic breast: Intraductal  papilloma with fibrocystic change and usual ductal hyperplasia, fibrocystic change with usual ductal hyperplasia, microcalcifications, and previous biopsy site. TNM: pT2, pN0, pMX. Comments: Although there are definitive features of high grade invasive ductal carcinoma identified, there are multiple foci where the carcinoma assumes Beard spindle cell morphology / spindle cell differentiation. 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 There are no heterologous elements identified. (CRR:eps 09/15/12)  RADIOGRAPHIC STUDIES:  Chest 2 View  09/08/2012  *RADIOLOGY REPORT*  Clinical Data: Right-sided breast  CHEST - 2 VIEW  Comparison: Chest x-ray 12/06/2009.  Findings: Lung volumes are normal.  No consolidative airspace disease.  No pleural effusions.  No pneumothorax.  No pulmonary nodule or mass noted.  Pulmonary vasculature and the cardiomediastinal silhouette are within normal limits.  Mild bilateral apical pleuroparenchymal thickening most compatible with chronic scarring, unchanged compared to the prior examination.  IMPRESSION: 1. No radiographic evidence of acute cardiopulmonary disease.   Original Report Authenticated By: Trudie Reed, M.D.    Nm Sentinel Node Inj-no Rpt (breast)  09/11/2012  CLINICAL DATA: Invasive right breast cancer   Sulfur colloid was injected intradermally by the nuclear medicine  technologist for breast cancer sentinel node localization.      ASSESSMENT: 57 year old female with  #1 invasive ductal carcinoma of the right breast status post bilateral mastectomies for Beard 2.3 cm node negative essentially triple negative disease on the right. Without any evidence of malignancy on the left. Patient's Beard candidate for adjuvant chemotherapy. We discussed Adriamycin Cytoxan dose dense followed by weekly Taxol Carbo. Since her tumor was 2% estrogen receptor positive we will utilize an aromatase inhibitor  for 5 years. She understands the rationale for this.  She will receive cycle 12 Taxol/Carbo today.    #2 patient is also having considerable anxiety and will continue to take Effexor daily.    #3 she also began Prilosec 20 mg daily for reflux.  PLAN:   1. Doing well.  Proceed with Taxol/Carbo today.  She will receive Neupogen daily x 4 days due to her ANC of 1200 and upcoming trip to [place].    2.  She will continue on Neurontin TID for neuropathy.     3. She will return in 1 week for labs, and an appointment for evaluation.    All questions were answered. The patient knows to call the clinic with any problems, questions or concerns. We can certainly see the patient much sooner if necessary.  I spent 25 minutes counseling the patient face to face. The total time spent in the appointment was 30 minutes.   Cherie Ouch Lyn Hollingshead, NP Medical Oncology [facility] Phone: [phone] 03/03/2013, 9:52 AM

## 2013-03-03 NOTE — Patient Instructions (Signed)
Doing well.  Proceed with treatment.  We will give you neupogen x 4 days starting tomorrow.  We will see you back next week for labs and an appt for evaluation.  Please call us if you have any questions or concerns.

## 2013-03-04 ENCOUNTER — Ambulatory Visit (HOSPITAL_BASED_OUTPATIENT_CLINIC_OR_DEPARTMENT_OTHER): Payer: Private Health Insurance - Indemnity

## 2013-03-04 VITALS — BP 143/87 | HR 104 | Temp 98.5°F

## 2013-03-04 DIAGNOSIS — C50311 Malignant neoplasm of lower-inner quadrant of right female breast: Secondary | ICD-10-CM

## 2013-03-04 DIAGNOSIS — Z5189 Encounter for other specified aftercare: Secondary | ICD-10-CM

## 2013-03-04 DIAGNOSIS — C50319 Malignant neoplasm of lower-inner quadrant of unspecified female breast: Secondary | ICD-10-CM

## 2013-03-04 MED ORDER — FILGRASTIM 480 MCG/0.8ML IJ SOLN
480.0000 ug | Freq: Every day | INTRAMUSCULAR | Status: DC
  Administered 2013-03-04: 480 ug via SUBCUTANEOUS
  Filled 2013-03-04: qty 0.8

## 2013-03-05 ENCOUNTER — Ambulatory Visit (HOSPITAL_BASED_OUTPATIENT_CLINIC_OR_DEPARTMENT_OTHER): Payer: Private Health Insurance - Indemnity

## 2013-03-05 VITALS — BP 127/65 | HR 91 | Temp 97.8°F

## 2013-03-05 DIAGNOSIS — C50311 Malignant neoplasm of lower-inner quadrant of right female breast: Secondary | ICD-10-CM

## 2013-03-05 DIAGNOSIS — Z5189 Encounter for other specified aftercare: Secondary | ICD-10-CM

## 2013-03-05 DIAGNOSIS — C50319 Malignant neoplasm of lower-inner quadrant of unspecified female breast: Secondary | ICD-10-CM

## 2013-03-05 MED ORDER — FILGRASTIM 480 MCG/0.8ML IJ SOLN
480.0000 ug | Freq: Every day | INTRAMUSCULAR | Status: DC
  Administered 2013-03-05: 480 ug via SUBCUTANEOUS
  Filled 2013-03-05: qty 0.8

## 2013-03-06 ENCOUNTER — Ambulatory Visit (HOSPITAL_BASED_OUTPATIENT_CLINIC_OR_DEPARTMENT_OTHER): Payer: Private Health Insurance - Indemnity

## 2013-03-06 VITALS — BP 113/71 | HR 101 | Temp 98.2°F

## 2013-03-06 DIAGNOSIS — C50319 Malignant neoplasm of lower-inner quadrant of unspecified female breast: Secondary | ICD-10-CM

## 2013-03-06 DIAGNOSIS — Z5189 Encounter for other specified aftercare: Secondary | ICD-10-CM

## 2013-03-06 DIAGNOSIS — C50311 Malignant neoplasm of lower-inner quadrant of right female breast: Secondary | ICD-10-CM

## 2013-03-06 MED ORDER — FILGRASTIM 480 MCG/0.8ML IJ SOLN
480.0000 ug | Freq: Every day | INTRAMUSCULAR | Status: DC
  Administered 2013-03-06: 480 ug via SUBCUTANEOUS
  Filled 2013-03-06: qty 0.8

## 2013-03-07 ENCOUNTER — Ambulatory Visit (HOSPITAL_BASED_OUTPATIENT_CLINIC_OR_DEPARTMENT_OTHER): Payer: Private Health Insurance - Indemnity

## 2013-03-07 VITALS — BP 109/75 | HR 109 | Temp 98.2°F

## 2013-03-07 DIAGNOSIS — C50319 Malignant neoplasm of lower-inner quadrant of unspecified female breast: Secondary | ICD-10-CM

## 2013-03-07 DIAGNOSIS — Z5189 Encounter for other specified aftercare: Secondary | ICD-10-CM

## 2013-03-07 DIAGNOSIS — C50311 Malignant neoplasm of lower-inner quadrant of right female breast: Secondary | ICD-10-CM

## 2013-03-07 MED ORDER — FILGRASTIM 480 MCG/0.8ML IJ SOLN
480.0000 ug | Freq: Every day | INTRAMUSCULAR | Status: DC
  Administered 2013-03-07: 480 ug via SUBCUTANEOUS

## 2013-03-10 ENCOUNTER — Other Ambulatory Visit (HOSPITAL_BASED_OUTPATIENT_CLINIC_OR_DEPARTMENT_OTHER): Payer: Private Health Insurance - Indemnity | Admitting: Lab

## 2013-03-10 ENCOUNTER — Ambulatory Visit (HOSPITAL_BASED_OUTPATIENT_CLINIC_OR_DEPARTMENT_OTHER): Payer: Private Health Insurance - Indemnity | Admitting: Adult Health

## 2013-03-10 ENCOUNTER — Telehealth: Payer: Self-pay | Admitting: *Deleted

## 2013-03-10 ENCOUNTER — Encounter: Payer: Self-pay | Admitting: Adult Health

## 2013-03-10 VITALS — BP 137/81 | HR 112 | Temp 98.3°F | Resp 20 | Ht 65.0 in | Wt 147.3 lb

## 2013-03-10 DIAGNOSIS — C50319 Malignant neoplasm of lower-inner quadrant of unspecified female breast: Secondary | ICD-10-CM

## 2013-03-10 DIAGNOSIS — M899 Disorder of bone, unspecified: Secondary | ICD-10-CM

## 2013-03-10 DIAGNOSIS — C50311 Malignant neoplasm of lower-inner quadrant of right female breast: Secondary | ICD-10-CM

## 2013-03-10 DIAGNOSIS — G589 Mononeuropathy, unspecified: Secondary | ICD-10-CM

## 2013-03-10 DIAGNOSIS — G629 Polyneuropathy, unspecified: Secondary | ICD-10-CM

## 2013-03-10 DIAGNOSIS — M8580 Other specified disorders of bone density and structure, unspecified site: Secondary | ICD-10-CM

## 2013-03-10 LAB — CBC WITH DIFFERENTIAL/PLATELET
BASO%: 0.2 % (ref 0.0–2.0)
Basophils Absolute: 0 10*3/uL (ref 0.0–0.1)
EOS%: 0.2 % (ref 0.0–7.0)
Eosinophils Absolute: 0 10*3/uL (ref 0.0–0.5)
HCT: 34.8 % (ref 34.8–46.6)
HGB: 11.7 g/dL (ref 11.6–15.9)
LYMPH%: 20 % (ref 14.0–49.7)
MCH: 33.1 pg (ref 25.1–34.0)
MCHC: 33.6 g/dL (ref 31.5–36.0)
MCV: 98.6 fL (ref 79.5–101.0)
MONO#: 0.9 10*3/uL (ref 0.1–0.9)
MONO%: 18.2 % — ABNORMAL HIGH (ref 0.0–14.0)
NEUT#: 3.1 10*3/uL (ref 1.5–6.5)
NEUT%: 61.4 % (ref 38.4–76.8)
Platelets: 95 10*3/uL — ABNORMAL LOW (ref 145–400)
RBC: 3.53 10*6/uL — ABNORMAL LOW (ref 3.70–5.45)
RDW: 17 % — ABNORMAL HIGH (ref 11.2–14.5)
WBC: 5.1 10*3/uL (ref 3.9–10.3)
lymph#: 1 10*3/uL (ref 0.9–3.3)

## 2013-03-10 LAB — COMPREHENSIVE METABOLIC PANEL (CC13)
ALT: 69 U/L — ABNORMAL HIGH (ref 0–55)
AST: 48 U/L — ABNORMAL HIGH (ref 5–34)
Albumin: 3.7 g/dL (ref 3.5–5.0)
Alkaline Phosphatase: 204 U/L — ABNORMAL HIGH (ref 40–150)
BUN: 10.4 mg/dL (ref 7.0–26.0)
CO2: 27 mEq/L (ref 22–29)
Calcium: 9.3 mg/dL (ref 8.4–10.4)
Chloride: 103 mEq/L (ref 98–109)
Creatinine: 0.7 mg/dL (ref 0.6–1.1)
Glucose: 90 mg/dl (ref 70–140)
Potassium: 3.9 mEq/L (ref 3.5–5.1)
Sodium: 140 mEq/L (ref 136–145)
Total Bilirubin: 0.36 mg/dL (ref 0.20–1.20)
Total Protein: 7.3 g/dL (ref 6.4–8.3)

## 2013-03-10 MED ORDER — GABAPENTIN 100 MG PO CAPS: 200.0000 mg | ORAL_CAPSULE | Freq: Three times a day (TID) | ORAL | Status: DC

## 2013-03-10 NOTE — Progress Notes (Signed)
OFFICE PROGRESS NOTE  CC  [name], MD [address] [place] [address] Dr. Lurline Hare  Dr. Avel Peace  DIAGNOSIS: 57 year old female with new diagnosis of stage II a breast cancer that is triple negative    STAGE:  Cancer of lower-inner quadrant of female breast  Primary site: Breast (Right)  Staging method: AJCC 7th Edition  Clinical: Stage IIA (T2, N0, cM0)  Summary: Stage IIA (T2, N0, cM0)  PRIOR THERAPY: #1Patient's last mammogram was about 5 years ago. Recently she underwent a screening mammogram that showed a spiculated mass in the lower inner quadrant of the right breast. She had ultrasound performed that showed irregular mass at the 5:00 position 3 cm from the nipple measuring 1.1 cm. She had a needle core biopsy performed on 06/19/2012 the biopsy showed invasive ductal carcinoma with ductal carcinoma in situ grade 3 tumor was ER negative PR negative HER-2/neu negative. Ki-67 was elevated at 34%. She went on to have MRI of the breasts performed on 06/24/2012 the MRI showed in the middle third of the lower inner quadrant of the right breast a 1.5 x 1.3 x 1.9 cm irregular enhancing mass. No abnormal enhancement was seen in the left breast no enlarged axillary or internal mammary adenopathy was detected.   #2 patient is now status post bilateral mastectomies. On her right mastectomy she was found to have a 2.3 cm invasive ductal carcinoma 01 lymph nodes were positive for metastatic disease and this is T2 N0) stage II. Tumor was grade 3 ER 2% PR negative HER-2/neu negative with a Ki-67 at 34%. Left mastectomy only showed intraductal papilloma node atypia or malignancy was found.  #3 patient is now being considered for adjuvant chemotherapy since her tumor is essentially triple negative and is a stage II. We discussed the rationale for adjuvant chemotherapy. She will receive Adriamycin Cytoxan every 2 weeks for a total of 4 cycles with G-CSF support. Once  she completes this then she will receive Taxol and carboplatinum every week for a total of 12 weeks. I did discuss with her use of antiestrogen therapy such as an aromatase inhibitor since her tumor is 2% ER positive. I discussed the rationale for this.  CURRENT THERAPY: s/p Taxol Carbo week 12  INTERVAL HISTORY: Nancy Beard 57 y.o. female returns for followup of her right breast cancer. She is doing well following her Taxol Carbo.  Her wbc is steady.  She denies fevers, chills, nausea, vomiting, constipation.  Her numbness is stable.  Otherwise, a 10 point ROS is neg.    MEDICAL HISTORY: Past Medical History  Diagnosis Date  . Thyroid disease   . Depression   . Sprue   . Diverticulosis   . Hyperlipidemia   . Breast cancer     right  . Hypothyroidism   . Pneumonia     ALLERGIES:  is allergic to ciprofloxacin and gluten meal.  MEDICATIONS:  Current Outpatient Prescriptions  Medication Sig Dispense Refill  . acetaminophen (TYLENOL) 325 MG tablet Take 650 mg by mouth as needed.      . B Complex-C (SUPER B COMPLEX PO) Take 1 tablet by mouth daily.      [name] gabapentin (NEURONTIN) 100 MG capsule Take 1 capsule (100 mg total) by mouth 3 (three) times daily.  90 capsule  2  . levothyroxine (SYNTHROID, LEVOTHROID) 25 MCG tablet Take 25 mcg by mouth daily.      [name] lidocaine-prilocaine (EMLA) cream Apply topically as needed.  30 g  8  . liothyronine (CYTOMEL) 25 MCG tablet Take 12.5 mcg by mouth daily.      . Multiple Vitamin (MULTIVITAMIN) tablet Take 1 tablet by mouth daily.      . ondansetron (ZOFRAN) 8 MG tablet Take 1 tablet (8 mg total) by mouth 2 (two) times daily. Take two times a day starting the day after chemo for 3 days. Then take two times a day as needed for nausea or vomiting.  30 tablet  1  . prochlorperazine (COMPAZINE) 10 MG tablet       . prochlorperazine (COMPAZINE) 25 MG suppository Place 1 suppository (25 mg total) rectally every 12 (twelve) hours as needed for  nausea.  12 suppository  3  . valACYclovir (VALTREX) 500 MG tablet Take 1 tablet (500 mg total) by mouth 2 (two) times daily.  60 tablet  6  . venlafaxine XR (EFFEXOR-XR) 75 MG 24 hr capsule Take 1 capsule (75 mg total) by mouth daily.  90 capsule  3   No current facility-administered medications for this visit.    SURGICAL HISTORY:  Past Surgical History  Procedure Laterality Date  . Esophagogastroduodenoscopy  2007    sprue  . Colonoscopy  2006  . Cryoblation of cervix    . Breast lumpectomy    . Total mastectomy Bilateral 09/11/2012    Procedure: bilateral MASTECTOMY;  Surgeon: Adolph Pollack, MD;  Location: [facility] OR;  Service: General;  Laterality: Bilateral;  . Axillary sentinel node biopsy Right 09/11/2012    Procedure: AXILLARY SENTINEL lymph NODE  BIOPSY;  Surgeon: Adolph Pollack, MD;  Location: [facility] OR;  Service: General;  Laterality: Right;  right nuclear medicine injection 12:30   . Breast reconstruction with placement of tissue expander and flex hd (acellular hydrated dermis) Bilateral 09/11/2012    Procedure: BREAST RECONSTRUCTION WITH PLACEMENT OF TISSUE EXPANDER AND FLEX HD (ACELLULAR HYDRATED DERMIS) ADM;  Surgeon: Wayland Denis, DO;  Location: [facility] OR;  Service: Plastics;  Laterality: Bilateral;  . Incision and drainage of wound Left 09/16/2012    Procedure: Left Breast Evacuation of Hematoma;  Surgeon: Wayland Denis, DO;  Location: [facility] OR;  Service: Plastics;  Laterality: Left;  . Portacath placement Right 10/09/2012    Procedure: US GUIDED INSERTION PORT-A-CATH;  Surgeon: Adolph Pollack, MD;  Location: [facility];  Service: General;  Laterality: Right;  Right Subclavian Vein    REVIEW OF SYSTEMS:   General: fatigue (+), night sweats (-), fever (-), pain (-) Lymph: palpable nodes (-) HEENT: vision changes (-), mucositis (-), gum bleeding (-), epistaxis (-) Cardiovascular: chest pain (-), palpitations (-) Pulmonary: shortness of breath (-), dyspnea on  exertion (-), cough (-), hemoptysis (-) GI:  Early satiety (-), melena (-), dysphagia (-), nausea/vomiting (-), diarrhea (-) GU: dysuria (-), hematuria (-), incontinence (-) Musculoskeletal: joint swelling (-), joint pain (-), back pain (-) Neuro: weakness (-), numbness (+), headache (-), confusion (-) Skin: Rash (-), lesions (-), dryness (-) Psych: depression (-), suicidal/homicidal ideation (-), feeling of hopelessness (-)   PHYSICAL EXAMINATION: Blood pressure 137/81, pulse 112, temperature 98.3 F (36.8 C), temperature source Oral, resp. rate 20, height 5\' 5"  (1.651 m), weight 147 lb 4.8 oz (66.815 kg), last menstrual period 09/11/2012. Body mass index is 24.51 kg/(m^2). General: Patient is a well appearing female in no acute distress HEENT: PERRLA, sclerae anicteric no conjunctival pallor, MMM Neck: supple, no palpable adenopathy Lungs: clear to auscultation bilaterally, no wheezes, rhonchi, or rales Cardiovascular: regular rate rhythm, S1, S2, no  murmurs, rubs or gallops, HR recheck 105 Abdomen: Soft, non-tender, non-distended, normoactive bowel sounds, no HSM Extremities: warm and well perfused, no clubbing, cyanosis, or edema Skin: No rashes or lesions, scalp erythema appears to be a birthmark Neuro: Non-focal Breasts: bilateral mastectomy sites are well healed, no nodularity, masses, or skin changes, expanders in place. ECOG PERFORMANCE STATUS: 1 - Symptomatic but completely ambulatory  LABORATORY DATA: Lab Results  Component Value Date   WBC 5.1 03/10/2013   HGB 11.7 03/10/2013   HCT 34.8 03/10/2013   MCV 98.6 03/10/2013   PLT 95* 03/10/2013      Chemistry      Component Value Date/Time   NA 140 03/03/2013 0912   NA 138 09/15/2012 2035   K 4.3 03/03/2013 0912   K 3.6 09/15/2012 2035   CL 105 01/06/2013 0827   CL 103 09/15/2012 2035   CO2 24 03/03/2013 0912   CO2 26 09/15/2012 2035   BUN 9.1 03/03/2013 0912   BUN 4* 09/15/2012 2035   CREATININE 0.6 03/03/2013 0912   CREATININE  0.54 09/15/2012 2035      Component Value Date/Time   CALCIUM 9.1 03/03/2013 0912   CALCIUM 9.0 09/15/2012 2035   ALKPHOS 160* 03/03/2013 0912   ALKPHOS 142* 09/15/2012 2035   AST 35* 03/03/2013 0912   AST 32 09/15/2012 2035   ALT 67* 03/03/2013 0912   ALT 24 09/15/2012 2035   BILITOT 0.38 03/03/2013 0912   BILITOT 0.8 09/15/2012 2035     ADDITIONAL INFORMATION: 3. CHROMOGENIC IN-SITU HYBRIDIZATION Interpretation HER-2/NEU BY CISH - NO AMPLIFICATION OF HER-2 DETECTED. THE RATIO OF HER-2: CEP 17 SIGNALS WAS 1.52. Reference range: Ratio: HER2:CEP17 < 1.8 - gene amplification not observed Ratio: HER2:CEP 17 1.8-2.2 - equivocal result Ratio: HER2:CEP17 > 2.2 - gene amplification observed Pecola Leisure MD Pathologist, Electronic Signature ( Signed 09/22/2012) 3. PROGNOSTIC INDICATORS - ACIS Results IMMUNOHISTOCHEMICAL AND MORPHOMETRIC ANALYSIS BY THE AUTOMATED CELLULAR IMAGING SYSTEM (ACIS) Estrogen Receptor (Negative, <1%): 2%, POSITIVE, WEAK STAINING INTENSITY Progesterone Receptor (Negative, <1%): 0%, NEGATIVE COMMENT: The negative hormone receptor study in this case has an internal positive control. All controls stained appropriately Pecola Leisure MD Pathologist, Electronic Signature ( Signed 09/22/2012) 1 of 4 FINAL for Wehmeyer, Nickayla A [phone]) FINAL DIAGNOSIS Diagnosis 1. Lymph node, sentinel, biopsy, Right axilla - ONE LYMPH NODE, NEGATIVE FOR TUMOR (0/1). 2. Breast, simple mastectomy, Left - BENIGN BREAST TISSUE WITH INTRADUCTAL PAPILLOMA, SEE COMMENT. - NEGATIVE FOR ATYPIA OR MALIGNANCY. - SURGICAL MARGINS, NEGATIVE FOR ATYPIA OR MALIGNANCY. - MICROCALCIFICATIONS IDENTIFIED. 3. Breast, simple mastectomy, Right - INVASIVE DUCTAL CARCINOMA, GRADE III, WITH SPINDLE CELL DIFFERENTIATION (METAPLASTIC CARCINOMA) (2.3CM), SEE COMMENT. - NO LYMPHOVASCULAR INVASION IDENTIFIED. - INVASIVE TUMOR IS 1.5 CM FROM NEAREST MARGIN (DEEP). - DUCTAL CARCINOMA IN SITU, GRADE III, WITH  COMEDONECROSIS. - SEE TUMOR SYNOPTIC TEMPLATE BELOW. Microscopic Comment 2. Although there was no mass grossly identified, there are definitive morphologic features of intraductal papilloma with associated fibrocystic change present. In addition, there are fibrocystic changes with usual ductal hyperplasia in representative sections not involved by papilloma. Finally, foci of sclerosing adenosis and microcalcifications in benign ducts and lobules are present. The surgical resection margin(s) of the specimen were inked and microscopically evaluated. 3. BREAST, INVASIVE TUMOR, WITH LYMPH NODE SAMPLING Specimen, including laterality: Right breast. Procedure: Simple mastectomy. Grade: III of III. Tubule formation: 3. Nuclear pleomorphism: 3. Mitotic: 2. Tumor size (gross measurement): 2.3 cm. Margins: Invasive, distance to closest margin: 1.5 cm. In-situ, distance to closest margin: 1.5  cm (deep). If margin positive, focally or broadly: N/A. Lymphovascular invasion: Absent. Ductal carcinoma in situ: Present. Grade: III of III. Extensive intraductal component: Absent. Lobular neoplasia: Absent. Tumor focality: Unifocal. Treatment effect: None. If present, treatment effect in breast tissue, lymph nodes or both: N/A. Extent of tumor: Skin and Nipple: Grossly negative. Skeletal muscle: N/A. Lymph nodes: # examined: 1. Lymph nodes with metastasis: 0. Breast prognostic profile: Estrogen receptor: Repeated; previous study demonstrates 0% positivity ([id number]). Progesterone receptor: Repeated; previous study demonstrates 0% positivity ([id number]). HER-2/neu: Repeated; previous study demonstrated no amplification (1.55) ([id number]). 2 of 4 FINAL for DERICA, LEIBER A [phone]) Microscopic Comment(continued) Ki-67: Not repeated; previous study demonstrates 34% proliferation rate [phone]). Non-neoplastic breast: Intraductal papilloma with fibrocystic change and usual ductal  hyperplasia, fibrocystic change with usual ductal hyperplasia, microcalcifications, and previous biopsy site. TNM: pT2, pN0, pMX. Comments: Although there are definitive features of high grade invasive ductal carcinoma identified, there are multiple foci where the carcinoma assumes a spindle cell morphology / spindle cell differentiation. 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 There are no heterologous elements identified. (CRR:eps 09/15/12)  RADIOGRAPHIC STUDIES:  Chest 2 View  09/08/2012  *RADIOLOGY REPORT*  Clinical Data: Right-sided breast  CHEST - 2 VIEW  Comparison: Chest x-ray 12/06/2009.  Findings: Lung volumes are normal.  No consolidative airspace disease.  No pleural effusions.  No pneumothorax.  No pulmonary nodule or mass noted.  Pulmonary vasculature and the cardiomediastinal silhouette are within normal limits.  Mild bilateral apical pleuroparenchymal thickening most compatible with chronic scarring, unchanged compared to the prior examination.  IMPRESSION: 1. No radiographic evidence of acute cardiopulmonary disease.   Original Report Authenticated By: Trudie Reed, M.D.    Nm Sentinel Node Inj-no Rpt (breast)  09/11/2012  CLINICAL DATA: Invasive right breast cancer   Sulfur colloid was injected intradermally by the nuclear medicine  technologist for breast cancer sentinel node localization.      ASSESSMENT: 57 year old female with  #1 invasive ductal carcinoma of the right breast status post bilateral mastectomies for a 2.3 cm node negative essentially triple negative disease on the right. Without any evidence of malignancy on the left. Patient's a candidate for adjuvant chemotherapy. We discussed Adriamycin Cytoxan dose dense followed by weekly Taxol Carbo. Since her tumor was 2% estrogen receptor positive we will utilize an aromatase inhibitor for 5 years. She understands the rationale for this.   She will receive cycle 12 Taxol/Carbo today.    #2 patient is also having considerable anxiety and will continue to take Effexor daily.    #3 she also began Prilosec 20 mg daily for reflux.  PLAN:   1. Doing well.  Patients labs are stable.  She and I discussed arimidex today.  She will read about the medication.  I ordered a bone density evaluation.  She will likely start when she returns from vacation in [place].    2.  She will continue on Neurontin TID for neuropathy.     3. She will return in 1 month for labs, and an appointment for evaluation.    All questions were answered. The patient knows to call the clinic with any problems, questions or concerns. We can certainly see the patient much sooner if necessary.  I spent 25 minutes counseling the patient face to face. The total time spent in the appointment was 30 minutes.   Cherie Ouch Lyn Hollingshead, NP Medical Oncology [facility] Phone: [phone]  [phone] 03/10/2013, 3:18 PM

## 2013-03-10 NOTE — Patient Instructions (Signed)

## 2013-03-10 NOTE — Telephone Encounter (Signed)
appts made and printed...td 

## 2013-03-13 ENCOUNTER — Encounter: Payer: Self-pay | Admitting: Internal Medicine

## 2013-03-23 ENCOUNTER — Other Ambulatory Visit (HOSPITAL_BASED_OUTPATIENT_CLINIC_OR_DEPARTMENT_OTHER): Payer: Private Health Insurance - Indemnity | Admitting: Lab

## 2013-03-23 ENCOUNTER — Encounter: Payer: Self-pay | Admitting: Adult Health

## 2013-03-23 ENCOUNTER — Telehealth: Payer: Self-pay | Admitting: Oncology

## 2013-03-23 ENCOUNTER — Ambulatory Visit (HOSPITAL_BASED_OUTPATIENT_CLINIC_OR_DEPARTMENT_OTHER): Payer: Private Health Insurance - Indemnity | Admitting: Adult Health

## 2013-03-23 ENCOUNTER — Other Ambulatory Visit: Payer: Self-pay | Admitting: *Deleted

## 2013-03-23 VITALS — BP 120/75 | HR 93 | Temp 98.8°F | Resp 20 | Ht 65.0 in | Wt 144.8 lb

## 2013-03-23 DIAGNOSIS — F411 Generalized anxiety disorder: Secondary | ICD-10-CM

## 2013-03-23 DIAGNOSIS — I82409 Acute embolism and thrombosis of unspecified deep veins of unspecified lower extremity: Secondary | ICD-10-CM

## 2013-03-23 DIAGNOSIS — C50319 Malignant neoplasm of lower-inner quadrant of unspecified female breast: Secondary | ICD-10-CM

## 2013-03-23 DIAGNOSIS — M79604 Pain in right leg: Secondary | ICD-10-CM

## 2013-03-23 DIAGNOSIS — G589 Mononeuropathy, unspecified: Secondary | ICD-10-CM

## 2013-03-23 DIAGNOSIS — C50311 Malignant neoplasm of lower-inner quadrant of right female breast: Secondary | ICD-10-CM

## 2013-03-23 DIAGNOSIS — I82401 Acute embolism and thrombosis of unspecified deep veins of right lower extremity: Secondary | ICD-10-CM

## 2013-03-23 DIAGNOSIS — K209 Esophagitis, unspecified without bleeding: Secondary | ICD-10-CM

## 2013-03-23 DIAGNOSIS — Z171 Estrogen receptor negative status [ER-]: Secondary | ICD-10-CM

## 2013-03-23 DIAGNOSIS — R7401 Elevation of levels of liver transaminase levels: Secondary | ICD-10-CM

## 2013-03-23 LAB — COMPREHENSIVE METABOLIC PANEL (CC13)
ALT: 116 U/L — ABNORMAL HIGH (ref 0–55)
AST: 73 U/L — ABNORMAL HIGH (ref 5–34)
Albumin: 3 g/dL — ABNORMAL LOW (ref 3.5–5.0)
Alkaline Phosphatase: 484 U/L — ABNORMAL HIGH (ref 40–150)
BUN: 8.3 mg/dL (ref 7.0–26.0)
CO2: 26 mEq/L (ref 22–29)
Calcium: 9.8 mg/dL (ref 8.4–10.4)
Chloride: 104 mEq/L (ref 98–109)
Creatinine: 0.6 mg/dL (ref 0.6–1.1)
Glucose: 109 mg/dl (ref 70–140)
Potassium: 4.3 mEq/L (ref 3.5–5.1)
Sodium: 139 mEq/L (ref 136–145)
Total Bilirubin: 0.31 mg/dL (ref 0.20–1.20)
Total Protein: 7.1 g/dL (ref 6.4–8.3)

## 2013-03-23 LAB — CBC WITH DIFFERENTIAL/PLATELET
BASO%: 0.4 % (ref 0.0–2.0)
Basophils Absolute: 0 10*3/uL (ref 0.0–0.1)
EOS%: 2.7 % (ref 0.0–7.0)
Eosinophils Absolute: 0.1 10*3/uL (ref 0.0–0.5)
HCT: 28.3 % — ABNORMAL LOW (ref 34.8–46.6)
HGB: 9.6 g/dL — ABNORMAL LOW (ref 11.6–15.9)
LYMPH%: 51.9 % — ABNORMAL HIGH (ref 14.0–49.7)
MCH: 33.5 pg (ref 25.1–34.0)
MCHC: 33.9 g/dL (ref 31.5–36.0)
MCV: 98.6 fL (ref 79.5–101.0)
MONO#: 0.4 10*3/uL (ref 0.1–0.9)
MONO%: 12.6 % (ref 0.0–14.0)
NEUT#: 1.1 10*3/uL — ABNORMAL LOW (ref 1.5–6.5)
NEUT%: 32.4 % — ABNORMAL LOW (ref 38.4–76.8)
Platelets: 106 10*3/uL — ABNORMAL LOW (ref 145–400)
RBC: 2.88 10*6/uL — ABNORMAL LOW (ref 3.70–5.45)
RDW: 17.6 % — ABNORMAL HIGH (ref 11.2–14.5)
WBC: 3.4 10*3/uL — ABNORMAL LOW (ref 3.9–10.3)
lymph#: 1.7 10*3/uL (ref 0.9–3.3)

## 2013-03-23 MED ORDER — OXYCODONE HCL 5 MG PO TABS: 5.0000 mg | ORAL_TABLET | ORAL | Status: DC | PRN

## 2013-03-23 NOTE — Progress Notes (Signed)
Pt called stating she had pain in her leg prior to going to [facility].  She went to [facility] and the pain worsened.  She went to the ED and was found to have DVT.  She spent 2.5 days in the hospital.  Per pt she was given coumadin and a "shot in my stomach".  She is currently taking Zerelto.  She stayed at [facility] in [place], [place].  I will call and request MR.  Pt is scheduled to see Augustin Schooling, NP at 2:45pm.  Confirmed appt date and time.  Pt denies further needs at this time.

## 2013-03-23 NOTE — Progress Notes (Signed)
OFFICE PROGRESS NOTE  CC  Nancy Mew, MD [address] [place] [address] Dr. Lurline Beard  Dr. Avel Beard  DIAGNOSIS: 57 year old female with new diagnosis of stage II a breast cancer that is triple negative    STAGE:  Cancer of lower-inner quadrant of female breast  Primary site: Breast (Right)  Staging method: AJCC 7th Edition  Clinical: Stage IIA (T2, N0, cM0)  Summary: Stage IIA (T2, N0, cM0)  PRIOR THERAPY: #1Patient's last mammogram was about 5 years ago. Recently she underwent a screening mammogram that showed a spiculated mass in the lower inner quadrant of the right breast. She had ultrasound performed that showed irregular mass at the 5:00 position 3 cm from the nipple measuring 1.1 cm. She had a needle core biopsy performed on 06/19/2012 the biopsy showed invasive ductal carcinoma with ductal carcinoma in situ grade 3 tumor was ER negative PR negative HER-2/neu negative. Ki-67 was elevated at 34%. She went on to have MRI of the breasts performed on 06/24/2012 the MRI showed in the middle third of the lower inner quadrant of the right breast a 1.5 x 1.3 x 1.9 cm irregular enhancing mass. No abnormal enhancement was seen in the left breast no enlarged axillary or internal mammary adenopathy was detected.   #2 patient is now status post bilateral mastectomies. On her right mastectomy she was found to have a 2.3 cm invasive ductal carcinoma 01 lymph nodes were positive for metastatic disease and this is T2 N0) stage II. Tumor was grade 3 ER 2% PR negative HER-2/neu negative with a Ki-67 at 34%. Left mastectomy only showed intraductal papilloma node atypia or malignancy was found.  #3 patient is now being considered for adjuvant chemotherapy since her tumor is essentially triple negative and is a stage II. We discussed the rationale for adjuvant chemotherapy. She will receive Adriamycin Cytoxan every 2 weeks for a total of 4 cycles with G-CSF support. Once  she completes this then she will receive Taxol and carboplatinum every week for a total of 12 weeks. I did discuss with her use of antiestrogen therapy such as an aromatase inhibitor since her tumor is 2% ER positive. I discussed the rationale for this.  CURRENT THERAPY: s/p Taxol Carbo week 12  INTERVAL HISTORY: Nancy Beard 57 y.o. female returns for an urgent appointment.  She had just completed chemotherapy and went on a trip to [place].  There she developed right leg swelling and pain.  She called the on call physician and was advised to go to the local ED.  She was diagnosed with a DVT in the RLE on 03/15/13.  She received about 3 Enoxaparin injections followed by Xarelto BID.  She has continued on that and is tolerating it well.  She does have leg pain and is taking 1g tylenol every four hours around the clock.  She has mild blood streaked stool.  Otherwise a 10 point ROS is neg.   MEDICAL HISTORY: Past Medical History  Diagnosis Date  . Thyroid disease   . Depression   . Sprue   . Diverticulosis   . Hyperlipidemia   . Breast cancer     right  . Hypothyroidism   . Pneumonia     ALLERGIES:  is allergic to ciprofloxacin and gluten meal.  MEDICATIONS:  Current Outpatient Prescriptions  Medication Sig Dispense Refill  . acetaminophen (TYLENOL) 325 MG tablet Take 650 mg by mouth as needed.      . B Complex-C (SUPER B  COMPLEX PO) Take 1 tablet by mouth daily.      [name] gabapentin (NEURONTIN) 100 MG capsule Take 2 capsules (200 mg total) by mouth 3 (three) times daily.  180 capsule  2  . levothyroxine (SYNTHROID, LEVOTHROID) 25 MCG tablet Take 25 mcg by mouth daily.      [name] liothyronine (CYTOMEL) 25 MCG tablet Take 12.5 mcg by mouth daily.      . Multiple Vitamin (MULTIVITAMIN) tablet Take 1 tablet by mouth daily.      . Rivaroxaban (XARELTO) 15 MG TABS tablet Take 15 mg by mouth 2 (two) times daily with a meal.      . Sulfamethoxazole-Trimethoprim (BACTRIM DS PO) Take 1 tablet by  mouth 2 (two) times daily.      . valACYclovir (VALTREX) 500 MG tablet Take 1 tablet (500 mg total) by mouth 2 (two) times daily.  60 tablet  6  . venlafaxine XR (EFFEXOR-XR) 75 MG 24 hr capsule Take 1 capsule (75 mg total) by mouth daily.  90 capsule  3   No current facility-administered medications for this visit.    SURGICAL HISTORY:  Past Surgical History  Procedure Laterality Date  . Esophagogastroduodenoscopy  2007    sprue  . Colonoscopy  2006  . Cryoblation of cervix    . Breast lumpectomy    . Total mastectomy Bilateral 09/11/2012    Procedure: bilateral MASTECTOMY;  Surgeon: Nancy Pollack, MD;  Location: [facility] OR;  Service: General;  Laterality: Bilateral;  . Axillary sentinel node biopsy Right 09/11/2012    Procedure: AXILLARY SENTINEL lymph NODE  BIOPSY;  Surgeon: Nancy Pollack, MD;  Location: [facility] OR;  Service: General;  Laterality: Right;  right nuclear medicine injection 12:30   . Breast reconstruction with placement of tissue expander and flex hd (acellular hydrated dermis) Bilateral 09/11/2012    Procedure: BREAST RECONSTRUCTION WITH PLACEMENT OF TISSUE EXPANDER AND FLEX HD (ACELLULAR HYDRATED DERMIS) ADM;  Surgeon: Nancy Denis, DO;  Location: [facility] OR;  Service: Plastics;  Laterality: Bilateral;  . Incision and drainage of wound Left 09/16/2012    Procedure: Left Breast Evacuation of Hematoma;  Surgeon: Nancy Denis, DO;  Location: [facility] OR;  Service: Plastics;  Laterality: Left;  . Portacath placement Right 10/09/2012    Procedure: US GUIDED INSERTION PORT-A-CATH;  Surgeon: Nancy Pollack, MD;  Location: [facility];  Service: General;  Laterality: Right;  Right Subclavian Vein    REVIEW OF SYSTEMS:   General: fatigue (+), night sweats (-), fever (-), pain (-) Lymph: palpable nodes (-) HEENT: vision changes (-), mucositis (-), gum bleeding (-), epistaxis (-) Cardiovascular: chest pain (-), palpitations (-) Pulmonary: shortness of breath (-), dyspnea on  exertion (-), cough (-), hemoptysis (-) GI:  Early satiety (-), melena (-), dysphagia (-), nausea/vomiting (-), diarrhea (-) GU: dysuria (-), hematuria (-), incontinence (-) Musculoskeletal: joint swelling (-), joint pain (-), back pain (-) Neuro: weakness (-), numbness (+), headache (-), confusion (-) Skin: Rash (-), lesions (-), dryness (-) Psych: depression (-), suicidal/homicidal ideation (-), feeling of hopelessness (-)   PHYSICAL EXAMINATION: Blood pressure 120/75, pulse 93, temperature 98.8 F (37.1 C), temperature source Oral, resp. rate 20, height 5\' 5"  (1.651 m), weight 144 lb 12.8 oz (65.681 kg), last menstrual period 09/11/2012. Body mass index is 24.1 kg/(m^2). General: Patient is a well appearing female in no acute distress HEENT: PERRLA, sclerae anicteric no conjunctival pallor, MMM Neck: supple, no palpable adenopathy Lungs: clear to auscultation bilaterally, no wheezes, rhonchi, or  rales Cardiovascular: regular rate rhythm, S1, S2, no murmurs, rubs or gallops, HR recheck 105 Abdomen: Soft, non-tender, non-distended, normoactive bowel sounds, no HSM Extremities: warm and well perfused, no clubbing, cyanosis, right leg slightly swollen, diminished DP pulse on right Skin: No rashes or lesions, scalp erythema appears to be a birthmark Neuro: Non-focal Breasts: bilateral mastectomy sites are well healed, no nodularity, masses, or skin changes, expanders in place. ECOG PERFORMANCE STATUS: 1 - Symptomatic but completely ambulatory  LABORATORY DATA: Lab Results  Component Value Date   WBC 3.4* 03/23/2013   HGB 9.6* 03/23/2013   HCT 28.3* 03/23/2013   MCV 98.6 03/23/2013   PLT 106* 03/23/2013      Chemistry      Component Value Date/Time   NA 139 03/23/2013 1425   NA 138 09/15/2012 2035   K 4.3 03/23/2013 1425   K 3.6 09/15/2012 2035   CL 105 01/06/2013 0827   CL 103 09/15/2012 2035   CO2 26 03/23/2013 1425   CO2 26 09/15/2012 2035   BUN 8.3 03/23/2013 1425   BUN 4* 09/15/2012 2035    CREATININE 0.6 03/23/2013 1425   CREATININE 0.54 09/15/2012 2035      Component Value Date/Time   CALCIUM 9.8 03/23/2013 1425   CALCIUM 9.0 09/15/2012 2035   ALKPHOS 484* 03/23/2013 1425   ALKPHOS 142* 09/15/2012 2035   AST 73* 03/23/2013 1425   AST 32 09/15/2012 2035   ALT 116* 03/23/2013 1425   ALT 24 09/15/2012 2035   BILITOT 0.31 03/23/2013 1425   BILITOT 0.8 09/15/2012 2035     ADDITIONAL INFORMATION: 3. CHROMOGENIC IN-SITU HYBRIDIZATION Interpretation HER-2/NEU BY CISH - NO AMPLIFICATION OF HER-2 DETECTED. THE RATIO OF HER-2: CEP 17 SIGNALS WAS 1.52. Reference range: Ratio: HER2:CEP17 < 1.8 - gene amplification not observed Ratio: HER2:CEP 17 1.8-2.2 - equivocal result Ratio: HER2:CEP17 > 2.2 - gene amplification observed Pecola Leisure MD Pathologist, Electronic Signature ( Signed 09/22/2012) 3. PROGNOSTIC INDICATORS - ACIS Results IMMUNOHISTOCHEMICAL AND MORPHOMETRIC ANALYSIS BY THE AUTOMATED CELLULAR IMAGING SYSTEM (ACIS) Estrogen Receptor (Negative, <1%): 2%, POSITIVE, WEAK STAINING INTENSITY Progesterone Receptor (Negative, <1%): 0%, NEGATIVE COMMENT: The negative hormone receptor study in this case has an internal positive control. All controls stained appropriately Pecola Leisure MD Pathologist, Electronic Signature ( Signed 09/22/2012) 1 of 4 FINAL for Prisco, Ahava A [phone]) FINAL DIAGNOSIS Diagnosis 1. Lymph node, sentinel, biopsy, Right axilla - ONE LYMPH NODE, NEGATIVE FOR TUMOR (0/1). 2. Breast, simple mastectomy, Left - BENIGN BREAST TISSUE WITH INTRADUCTAL PAPILLOMA, SEE COMMENT. - NEGATIVE FOR ATYPIA OR MALIGNANCY. - SURGICAL MARGINS, NEGATIVE FOR ATYPIA OR MALIGNANCY. - MICROCALCIFICATIONS IDENTIFIED. 3. Breast, simple mastectomy, Right - INVASIVE DUCTAL CARCINOMA, GRADE III, WITH SPINDLE CELL DIFFERENTIATION (METAPLASTIC CARCINOMA) (2.3CM), SEE COMMENT. - NO LYMPHOVASCULAR INVASION IDENTIFIED. - INVASIVE TUMOR IS 1.5 CM FROM NEAREST MARGIN (DEEP). - DUCTAL  CARCINOMA IN SITU, GRADE III, WITH COMEDONECROSIS. - SEE TUMOR SYNOPTIC TEMPLATE BELOW. Microscopic Comment 2. Although there was no mass grossly identified, there are definitive morphologic features of intraductal papilloma with associated fibrocystic change present. In addition, there are fibrocystic changes with usual ductal hyperplasia in representative sections not involved by papilloma. Finally, foci of sclerosing adenosis and microcalcifications in benign ducts and lobules are present. The surgical resection margin(s) of the specimen were inked and microscopically evaluated. 3. BREAST, INVASIVE TUMOR, WITH LYMPH NODE SAMPLING Specimen, including laterality: Right breast. Procedure: Simple mastectomy. Grade: III of III. Tubule formation: 3. Nuclear pleomorphism: 3. Mitotic: 2. Tumor size (gross measurement): 2.3  cm. Margins: Invasive, distance to closest margin: 1.5 cm. In-situ, distance to closest margin: 1.5 cm (deep). If margin positive, focally or broadly: N/A. Lymphovascular invasion: Absent. Ductal carcinoma in situ: Present. Grade: III of III. Extensive intraductal component: Absent. Lobular neoplasia: Absent. Tumor focality: Unifocal. Treatment effect: None. If present, treatment effect in breast tissue, lymph nodes or both: N/A. Extent of tumor: Skin and Nipple: Grossly negative. Skeletal muscle: N/A. Lymph nodes: # examined: 1. Lymph nodes with metastasis: 0. Breast prognostic profile: Estrogen receptor: Repeated; previous study demonstrates 0% positivity ([id number]). Progesterone receptor: Repeated; previous study demonstrates 0% positivity ([id number]). HER-2/neu: Repeated; previous study demonstrated no amplification (1.55) ([id number]). 2 of 4 FINAL for FAWNA, CRANMER A [phone]) Microscopic Comment(continued) Ki-67: Not repeated; previous study demonstrates 34% proliferation rate [phone]). Non-neoplastic breast: Intraductal papilloma with  fibrocystic change and usual ductal hyperplasia, fibrocystic change with usual ductal hyperplasia, microcalcifications, and previous biopsy site. TNM: pT2, pN0, pMX. Comments: Although there are definitive features of high grade invasive ductal carcinoma identified, there are multiple foci where the carcinoma assumes a spindle cell morphology / spindle cell differentiation. 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 There are no heterologous elements identified. (CRR:eps 09/15/12)  RADIOGRAPHIC STUDIES:  Chest 2 View  09/08/2012  *RADIOLOGY REPORT*  Clinical Data: Right-sided breast  CHEST - 2 VIEW  Comparison: Chest x-ray 12/06/2009.  Findings: Lung volumes are normal.  No consolidative airspace disease.  No pleural effusions.  No pneumothorax.  No pulmonary nodule or mass noted.  Pulmonary vasculature and the cardiomediastinal silhouette are within normal limits.  Mild bilateral apical pleuroparenchymal thickening most compatible with chronic scarring, unchanged compared to the prior examination.  IMPRESSION: 1. No radiographic evidence of acute cardiopulmonary disease.   Original Report Authenticated By: Trudie Reed, M.D.    Nm Sentinel Node Inj-no Rpt (breast)  09/11/2012  CLINICAL DATA: Invasive right breast cancer   Sulfur colloid was injected intradermally by the nuclear medicine  technologist for breast cancer sentinel node localization.      ASSESSMENT: 57 year old female with  #1 invasive ductal carcinoma of the right breast status post bilateral mastectomies for a 2.3 cm node negative essentially triple negative disease on the right. Without any evidence of malignancy on the left. Patient's a candidate for adjuvant chemotherapy. We discussed Adriamycin Cytoxan dose dense followed by weekly Taxol Carbo. Since her tumor was 2% estrogen receptor positive we will utilize an aromatase inhibitor for 5 years. She  understands the rationale for this.  She will receive cycle 12 Taxol/Carbo today.    #2 patient is also having considerable anxiety and will continue to take Effexor daily.    #3 she also began Prilosec 20 mg daily for reflux.  PLAN:   1. Patient was started on Xarelto following 3 lovenox injections while in [place].  She is taking the Xarelto BID and tolerating it well.  We will repeat a doppler today and see her back in a week.  We will also get a hypercoagulable panel at that time.    2.  She will continue on Neurontin TID for neuropathy.     3.  She has transaminitis, I recommended she stop all tylenol and prescribed oxycodone for pain and instructed her to take 2.5-5mg  every 4 hours as needed.  Should her transaminases be elevated next week we will get an ultrasound for evaluation.    4. She will return in 1 week for labs and appt.  All questions were answered. The patient knows to call the clinic with any problems, questions or concerns. We can certainly see the patient much sooner if necessary.  I spent 25 minutes counseling the patient face to face. The total time spent in the appointment was 30 minutes.   Cherie Ouch Lyn Hollingshead, NP Medical Oncology [facility] Phone: [phone] 03/23/2013, 3:45 PM

## 2013-03-23 NOTE — Telephone Encounter (Signed)
, °

## 2013-03-24 ENCOUNTER — Telehealth: Payer: Self-pay | Admitting: *Deleted

## 2013-03-24 ENCOUNTER — Ambulatory Visit (HOSPITAL_COMMUNITY)
Admission: RE | Admit: 2013-03-24 | Discharge: 2013-03-24 | Disposition: A | Discharge status: Home / Self Care | Payer: Private Health Insurance - Indemnity | Source: Ambulatory Visit | Attending: Oncology | Admitting: Oncology

## 2013-03-24 DIAGNOSIS — I82409 Acute embolism and thrombosis of unspecified deep veins of unspecified lower extremity: Secondary | ICD-10-CM

## 2013-03-24 DIAGNOSIS — I82401 Acute embolism and thrombosis of unspecified deep veins of right lower extremity: Secondary | ICD-10-CM

## 2013-03-24 DIAGNOSIS — M7989 Other specified soft tissue disorders: Secondary | ICD-10-CM | POA: Insufficient documentation

## 2013-03-24 DIAGNOSIS — Z86718 Personal history of other venous thrombosis and embolism: Secondary | ICD-10-CM | POA: Insufficient documentation

## 2013-03-24 DIAGNOSIS — C50319 Malignant neoplasm of lower-inner quadrant of unspecified female breast: Secondary | ICD-10-CM | POA: Insufficient documentation

## 2013-03-24 DIAGNOSIS — M79609 Pain in unspecified limb: Secondary | ICD-10-CM | POA: Insufficient documentation

## 2013-03-24 NOTE — Progress Notes (Signed)
Bilateral lower extremity venous duplex completed.  Right:  DVT noted in the femoral, popliteal, posterior tibial, and peroneal veins.  No evidence of superficial thrombosis.  No Baker's cyst.  Left:  No evidence of DVT, superficial thrombosis, or Baker's cyst.   

## 2013-03-24 NOTE — Telephone Encounter (Signed)
DVT IS FEMORAL, POPLITEAL, POSTERIOR TIBIA, AND PERINEAL. THIS INFORMATION WAS GIVEN TO DR.KHAN'S NURSE, MEREDITH WALTON,RN.

## 2013-03-25 ENCOUNTER — Telehealth: Payer: Self-pay | Admitting: Emergency Medicine

## 2013-03-25 ENCOUNTER — Encounter (INDEPENDENT_AMBULATORY_CARE_PROVIDER_SITE_OTHER): Payer: Self-pay | Admitting: General Surgery

## 2013-03-25 NOTE — Telephone Encounter (Signed)
Called patient to evaluate how she is doing. States she hasn't taken any pain meds today and feels much better. States the swelling in her leg is improving.  States she has been resting and keeping leg elevated as much as possible. Instructed patient to continue current dose of Xarelto and to follow up in the office on 9/15 as scheduled. Patient knows to call with any questions or concerns.

## 2013-03-30 ENCOUNTER — Ambulatory Visit (HOSPITAL_BASED_OUTPATIENT_CLINIC_OR_DEPARTMENT_OTHER): Payer: Private Health Insurance - Indemnity | Admitting: Family

## 2013-03-30 ENCOUNTER — Ambulatory Visit: Payer: Private Health Insurance - Indemnity | Admitting: Adult Health

## 2013-03-30 ENCOUNTER — Encounter: Payer: Self-pay | Admitting: Family

## 2013-03-30 ENCOUNTER — Telehealth: Payer: Self-pay | Admitting: *Deleted

## 2013-03-30 ENCOUNTER — Other Ambulatory Visit: Payer: Private Health Insurance - Indemnity

## 2013-03-30 ENCOUNTER — Other Ambulatory Visit (HOSPITAL_BASED_OUTPATIENT_CLINIC_OR_DEPARTMENT_OTHER): Payer: Private Health Insurance - Indemnity | Admitting: Lab

## 2013-03-30 VITALS — BP 127/79 | HR 112 | Temp 97.8°F | Resp 18 | Ht 65.0 in | Wt 142.0 lb

## 2013-03-30 DIAGNOSIS — I82401 Acute embolism and thrombosis of unspecified deep veins of right lower extremity: Secondary | ICD-10-CM

## 2013-03-30 DIAGNOSIS — C50911 Malignant neoplasm of unspecified site of right female breast: Secondary | ICD-10-CM

## 2013-03-30 DIAGNOSIS — R110 Nausea: Secondary | ICD-10-CM

## 2013-03-30 DIAGNOSIS — I82409 Acute embolism and thrombosis of unspecified deep veins of unspecified lower extremity: Secondary | ICD-10-CM

## 2013-03-30 DIAGNOSIS — C50919 Malignant neoplasm of unspecified site of unspecified female breast: Secondary | ICD-10-CM

## 2013-03-30 DIAGNOSIS — C50319 Malignant neoplasm of lower-inner quadrant of unspecified female breast: Secondary | ICD-10-CM

## 2013-03-30 DIAGNOSIS — C50311 Malignant neoplasm of lower-inner quadrant of right female breast: Secondary | ICD-10-CM

## 2013-03-30 LAB — COMPREHENSIVE METABOLIC PANEL (CC13)
ALT: 52 U/L (ref 0–55)
AST: 43 U/L — ABNORMAL HIGH (ref 5–34)
Albumin: 3.4 g/dL — ABNORMAL LOW (ref 3.5–5.0)
Alkaline Phosphatase: 320 U/L — ABNORMAL HIGH (ref 40–150)
BUN: 12.5 mg/dL (ref 7.0–26.0)
CO2: 23 mEq/L (ref 22–29)
Calcium: 9.8 mg/dL (ref 8.4–10.4)
Chloride: 106 mEq/L (ref 98–109)
Creatinine: 0.7 mg/dL (ref 0.6–1.1)
Glucose: 107 mg/dl (ref 70–140)
Potassium: 3.8 mEq/L (ref 3.5–5.1)
Sodium: 138 mEq/L (ref 136–145)
Total Bilirubin: 0.34 mg/dL (ref 0.20–1.20)
Total Protein: 7.5 g/dL (ref 6.4–8.3)

## 2013-03-30 LAB — CBC WITH DIFFERENTIAL/PLATELET
BASO%: 0.7 % (ref 0.0–2.0)
Basophils Absolute: 0 10*3/uL (ref 0.0–0.1)
EOS%: 3.5 % (ref 0.0–7.0)
Eosinophils Absolute: 0.1 10*3/uL (ref 0.0–0.5)
HCT: 31.5 % — ABNORMAL LOW (ref 34.8–46.6)
HGB: 10.8 g/dL — ABNORMAL LOW (ref 11.6–15.9)
LYMPH%: 50.4 % — ABNORMAL HIGH (ref 14.0–49.7)
MCH: 33.4 pg (ref 25.1–34.0)
MCHC: 34.2 g/dL (ref 31.5–36.0)
MCV: 97.6 fL (ref 79.5–101.0)
MONO#: 0.4 10*3/uL (ref 0.1–0.9)
MONO%: 15.3 % — ABNORMAL HIGH (ref 0.0–14.0)
NEUT#: 0.7 10*3/uL — ABNORMAL LOW (ref 1.5–6.5)
NEUT%: 30.1 % — ABNORMAL LOW (ref 38.4–76.8)
Platelets: 219 10*3/uL (ref 145–400)
RBC: 3.23 10*6/uL — ABNORMAL LOW (ref 3.70–5.45)
RDW: 17.7 % — ABNORMAL HIGH (ref 11.2–14.5)
WBC: 2.3 10*3/uL — ABNORMAL LOW (ref 3.9–10.3)
lymph#: 1.2 10*3/uL (ref 0.9–3.3)

## 2013-03-30 MED ORDER — RIVAROXABAN 15 MG PO TABS: 15.0000 mg | ORAL_TABLET | Freq: Two times a day (BID) | ORAL | Status: DC

## 2013-03-30 MED ORDER — RIVAROXABAN 20 MG PO TABS: 20.0000 mg | ORAL_TABLET | Freq: Every day | ORAL | Status: DC

## 2013-03-30 MED ORDER — PROCHLORPERAZINE MALEATE 10 MG PO TABS: 10.0000 mg | ORAL_TABLET | Freq: Four times a day (QID) | ORAL | Status: DC | PRN

## 2013-03-30 NOTE — Telephone Encounter (Signed)
appts made and printed...td 

## 2013-03-30 NOTE — Patient Instructions (Addendum)
Please contact us at [phone] if you have any questions or concerns.  Please continue to do well and enjoy life!!!  Get plenty of rest, drink plenty of water, elevate your right leg as much as possible, eat a balanced diet.  [facility]:  Maris Berger MD  Address: [address] [place], [place], [place] [address]  [phone]  Call us immediately if you experience any: Shortness of breath Unususual bleeding or bruising Blood in urine or stool Nose bleeds Chest pain Fevers Leg swelling and/or redness    Results for orders placed in visit on 03/30/13 (from the past 24 hour(s))  CBC WITH DIFFERENTIAL     Status: Abnormal   Collection Time    03/30/13  9:11 AM      Result Value Range   WBC 2.3 (*) 3.9 - 10.3 10e3/uL   NEUT# 0.7 (*) 1.5 - 6.5 10e3/uL   HGB 10.8 (*) 11.6 - 15.9 g/dL   HCT 81.1 (*) 91.4 - 78.2 %   Platelets 219  145 - 400 10e3/uL   MCV 97.6  79.5 - 101.0 fL   MCH 33.4  25.1 - 34.0 pg   MCHC 34.2  31.5 - 36.0 g/dL   RBC 9.56 (*) 2.13 - 0.86 10e6/uL   RDW 17.7 (*) 11.2 - 14.5 %   lymph# 1.2  0.9 - 3.3 10e3/uL   MONO# 0.4  0.1 - 0.9 10e3/uL   Eosinophils Absolute 0.1  0.0 - 0.5 10e3/uL   Basophils Absolute 0.0  0.0 - 0.1 10e3/uL   NEUT% 30.1 (*) 38.4 - 76.8 %   LYMPH% 50.4 (*) 14.0 - 49.7 %   MONO% 15.3 (*) 0.0 - 14.0 %   EOS% 3.5  0.0 - 7.0 %   BASO% 0.7  0.0 - 2.0 %   Narrative:    Performed At:  [facility]               [address]               [place], [place] [address]  COMPREHENSIVE METABOLIC PANEL (CC13)     Status: Abnormal   Collection Time    03/30/13  9:12 AM      Result Value Range   Sodium 138  136 - 145 mEq/L   Potassium 3.8  3.5 - 5.1 mEq/L   Chloride 106  98 - 109 mEq/L   CO2 23  22 - 29 mEq/L   Glucose 107  70 - 140 mg/dl   BUN 96.2  7.0 - 95.2 mg/dL   Creatinine 0.7  0.6 - 1.1 mg/dL   Total Bilirubin 8.41  0.20 - 1.20 mg/dL   Alkaline Phosphatase 320 (*) 40 - 150 U/L   AST 43 (*) 5 - 34 U/L    ALT 52  0 - 55 U/L   Total Protein 7.5  6.4 - 8.3 g/dL   Albumin 3.4 (*) 3.5 - 5.0 g/dL   Calcium 9.8  8.4 - 32.4 mg/dL   Narrative:    Note: New Reference ranges.Performed At:  [facility]               [address]               [place], [place] [address]

## 2013-03-30 NOTE — Progress Notes (Signed)
[facility]  Telephone:([phone]) [phone] Fax:([phone]) [phone]  OFFICE PROGRESS NOTE   GM:WNUUVOZ,DGUY EDWARD, MD [address] [place] [address] RAD ONC:   Lurline Hare, MD  SU:  Avel Peace, MD SU:  Wayland Denis, DO GYN:  Janeece Riggers. Dareen Piano, MD   DIAGNOSIS: 57 y.o. 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    STAGE:  Cancer of lower-inner quadrant of female breast  Primary site: Breast (Right)  Staging method: AJCC 7th Edition  Clinical: Stage IIA (T2, N0, cM0)  Summary: Stage IIA (T2, N0, cM0)   PRIOR THERAPY: #1  Patient's last mammogram was about 5 years ago. Recently she underwent a screening mammogram that showed a spiculated mass in the lower inner quadrant of the right breast. She had ultrasound performed that showed irregular mass at the 5 o'clock position 3 cm from the nipple measuring 1.1 cm. She had a needle core biopsy performed on 06/18/2012.  The biopsy showed invasive ductal carcinoma with ductal carcinoma in situ, grade 3 tumor, estrogen receptor negative, progesterone receptor negative, HER-2/neu negative,  Ki-67 was elevated at 34%. She had a bilateral breast MRI on 06/24/2012.  The MRI showed in the middle third of the lower inner quadrant of the right breast a 1.5 x 1.3 x 1.9 cm irregular enhancing mass.  No abnormal enhancement was seen in the left breast.  No enlarged axillary or internal mammary adenopathy was detected.   #2 Status post bilateral mastectomies with right axillary node biopsy on 09/11/2012.  Right simple mastectomy showed a stage IIA, pT2, pN0, pMX,  2.3 cm invasive ductal carcinoma, grade 3,  with 0/1 metastatic right axillary lymph nodes, estrogen receptor 2% positive, progesterone receptor negative,  HER-2/neu negative with a Ki-67 at 34%.  Left simple mastectomy showed intraductal papilloma with microcalcifications identified, no atypia  or malignancy was found.  #3 She received adjuvant chemotherapy consisting of Adriamycin/Cytoxan every 2 weeks for a total of 4 cycles with G-CSF support from 10/15/2012 through 11/26/2012 x 4 cycles.  She then received adjuvant chemotherapy consisting of Taxol and Carboplatinum every week for a total of 12 weeks from 12/16/2012 through 03/03/2013.  #4 She developed right lower extremity DVT while on vacation in [place] on 03/17/2013 and started taking Xarelto at that time.   CURRENT THERAPY:  Xarelto 15 mg by mouth twice a day x 21 days (until 04/06/2013). Then maintenance dose of Xarelto at 20 mg by mouth daily.   INTERVAL HISTORY:  Dr. Welton Flakes and I saw Nancy Beard 57 y.o. female today for followup appointment regarding right lower extremity DVT.  It will be recalled while in [place] on vacation she was diagnosed with a DVT in the RLE on 03/15/2013.  While in [place], she received approximately 3 Enoxaparin injections followed by Xarelto BID.  She states that her right lower extremity pain has improved and she is no longer taking Tylenol for pain.  She does state that taking Xarelto does cause her some nausea  She is no longer experiencing blood-tinged stool.  She continues to have bilateral neuropathy in the tips of her fingers and toes.  Her interval history is otherwise unremarkable.   MEDICAL HISTORY: Past Medical History  Diagnosis Date  . Thyroid disease   . Depression   . Sprue   . Diverticulosis   . Hyperlipidemia   . Breast cancer     right  . Hypothyroidism   .  Pneumonia   . DVT (deep venous thrombosis)     Right lower extremity    ALLERGIES:  is allergic to ciprofloxacin and gluten meal.  MEDICATIONS:  Current Outpatient Prescriptions  Medication Sig Dispense Refill  . B Complex-C (SUPER B COMPLEX PO) Take 1 tablet by mouth daily.      [name] gabapentin (NEURONTIN) 100 MG capsule Take 2 capsules (200 mg total) by mouth 3 (three) times daily.  180 capsule  2  .  levothyroxine (SYNTHROID, LEVOTHROID) 25 MCG tablet Take 25 mcg by mouth daily.      [name] liothyronine (CYTOMEL) 25 MCG tablet Take 12.5 mcg by mouth daily.      . Multiple Vitamin (MULTIVITAMIN) tablet Take 1 tablet by mouth daily.      [name] venlafaxine XR (EFFEXOR-XR) 75 MG 24 hr capsule Take 1 capsule (75 mg total) by mouth daily.  90 capsule  3  . oxyCODONE (OXY IR/ROXICODONE) 5 MG immediate release tablet Take 1 tablet (5 mg total) by mouth every 4 (four) hours as needed for pain.  30 tablet  0  . prochlorperazine (COMPAZINE) 10 MG tablet Take 1 tablet (10 mg total) by mouth every 6 (six) hours as needed.  90 tablet  1  . Rivaroxaban (XARELTO) 20 MG TABS tablet Take 1 tablet (20 mg total) by mouth daily.  90 tablet  1   No current facility-administered medications for this visit.    SURGICAL HISTORY:  Past Surgical History  Procedure Laterality Date  . Esophagogastroduodenoscopy  2007    sprue  . Colonoscopy  2006  . Cryoblation of cervix    . Breast lumpectomy    . Total mastectomy Bilateral 09/11/2012    Procedure: bilateral MASTECTOMY;  Surgeon: Adolph Pollack, MD;  Location: [facility] OR;  Service: General;  Laterality: Bilateral;  . Axillary sentinel node biopsy Right 09/11/2012    Procedure: AXILLARY SENTINEL lymph NODE  BIOPSY;  Surgeon: Adolph Pollack, MD;  Location: [facility] OR;  Service: General;  Laterality: Right;  right nuclear medicine injection 12:30   . Breast reconstruction with placement of tissue expander and flex hd (acellular hydrated dermis) Bilateral 09/11/2012    Procedure: BREAST RECONSTRUCTION WITH PLACEMENT OF TISSUE EXPANDER AND FLEX HD (ACELLULAR HYDRATED DERMIS) ADM;  Surgeon: Wayland Denis, DO;  Location: [facility] OR;  Service: Plastics;  Laterality: Bilateral;  . Incision and drainage of wound Left 09/16/2012    Procedure: Left Breast Evacuation of Hematoma;  Surgeon: Wayland Denis, DO;  Location: [facility] OR;  Service: Plastics;  Laterality: Left;  . Portacath placement Right  10/09/2012    Procedure: US GUIDED INSERTION PORT-A-CATH;  Surgeon: Adolph Pollack, MD;  Location: [facility];  Service: General;  Laterality: Right;  Right Subclavian Vein    REVIEW OF SYSTEMS:   A 10 point review of systems was completed and is as noted above. The patient denies any other symptomatology including fatigue, fever or chills, headache, vision changes, swollen glands, cough or shortness of breath, chest pain or discomfort, nausea, vomiting, diarrhea, constipation, change in urinary or bowel habits, unusual bleeding/bruising or any other symptomatology.   PHYSICAL EXAMINATION: Blood pressure 127/79, pulse 112, temperature 97.8 F (36.6 C), temperature source Oral, resp. rate 18, height 5\' 5"  (1.651 m), weight 142 lb (64.411 kg), last menstrual period 09/11/2012, SpO2 98.00%. Body mass index is 23.63 kg/(m^2).  ECOG PERFORMANCE STATUS: 1 - Symptomatic but completely ambulatory  General appearance: Alert, cooperative, well nourished, thin frame, no apparent distress Head:  Normocephalic, without obvious abnormality, atraumatic, chemotherapy-induced alopecia Eyes: Conjunctivae/corneas clear, PERRLA, EOMI Nose: Nares, septum and mucosa are normal, no drainage or sinus tenderness Neck: No adenopathy, supple, symmetrical, trachea midline, no tenderness Resp: Clear to auscultation bilaterally, no wheezes/rales/rhonchi Cardio: Regular rate and rhythm, S1, S2 normal, no murmur, click, rub or gallop, no edema, right chest Port-A-Cath without signs of infection Breasts:  Deferred GI: Soft, not distended, non-tender, hypoactive bowel sounds, no organomegaly Skin: No rashes/lesions, skin warm and dry, no erythematous areas, no cyanosis  M/S:  Atraumatic, normal strength in all extremities, limited range of motion in right lower extremity, no clubbing  Lymph nodes: Cervical, supraclavicular, and axillary nodes normal Neurologic: Grossly normal, cranial nerves II through XII  intact, alert and oriented x 3 Psych: Appropriate affect   LABORATORY DATA: Lab Results  Component Value Date   WBC 2.3* 03/30/2013   HGB 10.8* 03/30/2013   HCT 31.5* 03/30/2013   MCV 97.6 03/30/2013   PLT 219 03/30/2013      Chemistry      Component Value Date/Time   NA 138 03/30/2013 0912   NA 138 09/15/2012 2035   K 3.8 03/30/2013 0912   K 3.6 09/15/2012 2035   CL 105 01/06/2013 0827   CL 103 09/15/2012 2035   CO2 23 03/30/2013 0912   CO2 26 09/15/2012 2035   BUN 12.5 03/30/2013 0912   BUN 4* 09/15/2012 2035   CREATININE 0.7 03/30/2013 0912   CREATININE 0.54 09/15/2012 2035      Component Value Date/Time   CALCIUM 9.8 03/30/2013 0912   CALCIUM 9.0 09/15/2012 2035   ALKPHOS 320* 03/30/2013 0912   ALKPHOS 142* 09/15/2012 2035   AST 43* 03/30/2013 0912   AST 32 09/15/2012 2035   ALT 52 03/30/2013 0912   ALT 24 09/15/2012 2035   BILITOT 0.34 03/30/2013 0912   BILITOT 0.8 09/15/2012 2035      RADIOGRAPHIC STUDIES: No results found.  ASSESSMENT: 57 y.o. woman with:  #1 Status post bilateral mastectomies with right axillary node biopsy on 09/11/2012.  Right simple mastectomy showed a stage IIA, pT2, pN0, pMX,  2.3 cm invasive ductal carcinoma, grade 3,  with 0/1 metastatic right axillary lymph nodes, estrogen receptor 2% positive, progesterone receptor negative,  HER-2/neu negative with a Ki-67 at 34%.  Left simple mastectomy showed intraductal papilloma with microcalcifications identified, no atypia or malignancy was found.  #2 She received adjuvant chemotherapy consisting of Adriamycin/Cytoxan every 2 weeks for a total of 4 cycles with G-CSF support from 10/15/2012 through 11/26/2012 x 4 cycles.  She then received adjuvant chemotherapy consisting of Taxol and Carboplatinum every week for a total of 12 weeks from 12/16/2012 through 03/03/2013.  #3 She developed right lower extremity DVT while on vacation in [place] in late 02/2013, received 3 Lovenox injections while in [place], and started taking  Xarelto on 03/17/2013.  #4 History of anxiety.  #5 Neuropathy  #6 Chronically elevated transaminases (followed by her PCP)   PLAN:  #1 She will continue taking Xarelto 15 mg by mouth twice a day for 21 days (until 04/06/2013) for right lower extremity DVT.  An electronic prescription for maintenance dose of Xarelto at 20 mg by mouth daily #90 with one refill was sent to the patient's pharmacy for her to start taking on 04/07/2013 (after 21 days loading dose).  6 months of anticoagulant therapy was discussed with the patient for right lower extremity DVT.  An electronic prescription for Compazine 10 mg by mouth every 6  hours as needed for nausea was also sent to the patient's pharmacy #90 with one refill she states Xarelto causes her nausea.  #2 The patient will continue taking Venlafaxine 75 mg by mouth daily for anxiety.  #3 The patient will continue taking Gabapentin  400 mg by mouth 3 times a day and B complex vitamins daily for neuropathy.  [facility] PT/rehabilitation clinic may be considered in the future if neuropathy continues.  #4 We plan to see the patient again in 3 months at which time we will check laboratories of CBC and CMP.  Initiation of antiestrogen therapy for estrogen receptor positive breast cancer will be reinitiated at that time.  The patient will be scheduled to receive Port-A-Cath flushes every 8 weeks.  All questions answered.  Ms. Antunes was encouraged to contact us in the interim with any questions, concerns, or problems.  She was also encouraged to contact us immediately if she experiences any leg swelling, fevers, chest pain, epistaxis, unusual bleeding/bruising, shortness of breath, or blood in her urine or stool.  Larina Bras NP-C 03/30/2013 7:43 PM  ATTENDING'S ATTESTATION:  I personally reviewed patient's chart, examined patient myself, formulated the treatment plan as followed.    Patient and I discussed her ongoing anticoagulation. She will continue to  be on an anticoagulant for now. In the meantime we will continue to follow her. Drue Second, MD Medical/Oncology [facility] [phone] (beeper) [phone] (Office)  04/05/2013, 11:19 PM

## 2013-04-01 LAB — HYPERCOAGULABLE PANEL, COMPREHENSIVE
AntiThromb III Func: 126 % (ref 76–126)
Anticardiolipin IgA: 3 APL U/mL (ref <22)
Anticardiolipin IgG: 9 GPL U/mL (ref <23)
Anticardiolipin IgM: 3 MPL U/mL (ref <11)
Beta-2 Glyco I IgG: 6 G Units (ref <20)
Beta-2-Glycoprotein I IgA: 8 A Units (ref <20)
Beta-2-Glycoprotein I IgM: 0 M Units (ref <20)
DRVVT 1:1 Mix: 39.6 secs (ref <42.9)
DRVVT: 48.4 secs — ABNORMAL HIGH (ref <42.9)
Lupus Anticoagulant: NOT DETECTED
PTT Lupus Anticoagulant: 37.5 secs (ref 28.0–43.0)
Protein C Activity: 200 % — ABNORMAL HIGH (ref 75–133)
Protein C, Total: 141 % (ref 72–160)
Protein S Activity: 141 % — ABNORMAL HIGH (ref 69–129)
Protein S Total: 122 % (ref 60–150)

## 2013-04-03 ENCOUNTER — Other Ambulatory Visit: Payer: Private Health Insurance - Indemnity

## 2013-04-08 ENCOUNTER — Ambulatory Visit
Admission: RE | Admit: 2013-04-08 | Discharge: 2013-04-08 | Disposition: A | Discharge status: Home / Self Care | Payer: Managed Care, Other (non HMO) | Source: Ambulatory Visit | Attending: Adult Health | Admitting: Adult Health

## 2013-04-08 DIAGNOSIS — M8580 Other specified disorders of bone density and structure, unspecified site: Secondary | ICD-10-CM

## 2013-04-10 ENCOUNTER — Telehealth: Payer: Self-pay | Admitting: Oncology

## 2013-04-10 ENCOUNTER — Encounter: Payer: Self-pay | Admitting: Adult Health

## 2013-04-10 ENCOUNTER — Ambulatory Visit (HOSPITAL_BASED_OUTPATIENT_CLINIC_OR_DEPARTMENT_OTHER): Payer: Private Health Insurance - Indemnity | Admitting: Adult Health

## 2013-04-10 ENCOUNTER — Other Ambulatory Visit (HOSPITAL_BASED_OUTPATIENT_CLINIC_OR_DEPARTMENT_OTHER): Payer: Private Health Insurance - Indemnity

## 2013-04-10 VITALS — BP 116/80 | HR 87 | Temp 98.4°F | Resp 20 | Ht 65.0 in | Wt 145.8 lb

## 2013-04-10 DIAGNOSIS — Z171 Estrogen receptor negative status [ER-]: Secondary | ICD-10-CM

## 2013-04-10 DIAGNOSIS — I82401 Acute embolism and thrombosis of unspecified deep veins of right lower extremity: Secondary | ICD-10-CM

## 2013-04-10 DIAGNOSIS — C50319 Malignant neoplasm of lower-inner quadrant of unspecified female breast: Secondary | ICD-10-CM

## 2013-04-10 DIAGNOSIS — D709 Neutropenia, unspecified: Secondary | ICD-10-CM

## 2013-04-10 DIAGNOSIS — I82409 Acute embolism and thrombosis of unspecified deep veins of unspecified lower extremity: Secondary | ICD-10-CM

## 2013-04-10 DIAGNOSIS — M8580 Other specified disorders of bone density and structure, unspecified site: Secondary | ICD-10-CM

## 2013-04-10 DIAGNOSIS — C50311 Malignant neoplasm of lower-inner quadrant of right female breast: Secondary | ICD-10-CM

## 2013-04-10 DIAGNOSIS — F411 Generalized anxiety disorder: Secondary | ICD-10-CM

## 2013-04-10 DIAGNOSIS — G589 Mononeuropathy, unspecified: Secondary | ICD-10-CM

## 2013-04-10 LAB — CBC WITH DIFFERENTIAL/PLATELET
BASO%: 0.7 % (ref 0.0–2.0)
Basophils Absolute: 0 10*3/uL (ref 0.0–0.1)
EOS%: 1.9 % (ref 0.0–7.0)
Eosinophils Absolute: 0 10*3/uL (ref 0.0–0.5)
HCT: 35.1 % (ref 34.8–46.6)
HGB: 11.8 g/dL (ref 11.6–15.9)
LYMPH%: 45.3 % (ref 14.0–49.7)
MCH: 32.8 pg (ref 25.1–34.0)
MCHC: 33.6 g/dL (ref 31.5–36.0)
MCV: 97.6 fL (ref 79.5–101.0)
MONO#: 0.5 10*3/uL (ref 0.1–0.9)
MONO%: 21.9 % — ABNORMAL HIGH (ref 0.0–14.0)
NEUT#: 0.7 10*3/uL — ABNORMAL LOW (ref 1.5–6.5)
NEUT%: 30.2 % — ABNORMAL LOW (ref 38.4–76.8)
Platelets: 274 10*3/uL (ref 145–400)
RBC: 3.59 10*6/uL — ABNORMAL LOW (ref 3.70–5.45)
RDW: 16.9 % — ABNORMAL HIGH (ref 11.2–14.5)
WBC: 2.2 10*3/uL — ABNORMAL LOW (ref 3.9–10.3)
lymph#: 1 10*3/uL (ref 0.9–3.3)

## 2013-04-10 LAB — COMPREHENSIVE METABOLIC PANEL (CC13)
ALT: 51 U/L (ref 0–55)
AST: 60 U/L — ABNORMAL HIGH (ref 5–34)
Albumin: 3.4 g/dL — ABNORMAL LOW (ref 3.5–5.0)
Alkaline Phosphatase: 209 U/L — ABNORMAL HIGH (ref 40–150)
BUN: 7.2 mg/dL (ref 7.0–26.0)
CO2: 24 mEq/L (ref 22–29)
Calcium: 9.5 mg/dL (ref 8.4–10.4)
Chloride: 108 mEq/L (ref 98–109)
Creatinine: 0.7 mg/dL (ref 0.6–1.1)
Glucose: 112 mg/dl (ref 70–140)
Potassium: 3.9 mEq/L (ref 3.5–5.1)
Sodium: 142 mEq/L (ref 136–145)
Total Bilirubin: 0.31 mg/dL (ref 0.20–1.20)
Total Protein: 6.9 g/dL (ref 6.4–8.3)

## 2013-04-10 MED ORDER — RIVAROXABAN 20 MG PO TABS: 20.0000 mg | ORAL_TABLET | Freq: Every day | ORAL | Status: DC

## 2013-04-10 NOTE — Patient Instructions (Signed)
Denosumab injection What is this medicine? DENOSUMAB slows bone breakdown. It is used to treat osteoporosis in women after menopause and in men. This medicine is also used to prevent bone fractures and other bone problems caused by cancer bone metastases. This medicine may be used for other purposes; ask your health care provider or pharmacist if you have questions. What should I tell my health care provider before I take this medicine? They need to know if you have any of these conditions: -dental disease -eczema -infection or history of infections -kidney disease or on dialysis -low blood calcium or vitamin D -malabsorption syndrome -scheduled to have surgery or tooth extraction -taking medicine that contains denosumab -thyroid or parathyroid disease -an unusual reaction to denosumab, other medicines, foods, dyes, or preservatives -pregnant or trying to get pregnant -breast-feeding How should I use this medicine? This medicine is for injection under the skin. It is given by a health care professional in a hospital or clinic setting. If you are getting Prolia, a special MedGuide will be given to you by the pharmacist with each prescription and refill. Be sure to read this information carefully each time. Talk to your pediatrician regarding the use of this medicine in children. Special care may be needed. Overdosage: If you think you've taken too much of this medicine contact a poison control center or emergency room at once. Overdosage: If you think you have taken too much of this medicine contact a poison control center or emergency room at once. NOTE: This medicine is only for you. Do not share this medicine with others. What if I miss a dose? It is important not to miss your dose. Call your doctor or health care professional if you are unable to keep an appointment. What may interact with this medicine? Do not take this medicine with any of the following medications: -other medicines  containing denosumab This medicine may also interact with the following medications: -medicines that suppress the immune system -medicines that treat cancer -steroid medicines like prednisone or cortisone This list may not describe all possible interactions. Give your health care provider a list of all the medicines, herbs, non-prescription drugs, or dietary supplements you use. Also tell them if you smoke, drink alcohol, or use illegal drugs. Some items may interact with your medicine. What should I watch for while using this medicine? Visit your doctor or health care professional for regular checks on your progress. Your doctor or health care professional may order blood tests and other tests to see how you are doing. Call your doctor or health care professional if you get a cold or other infection while receiving this medicine. Do not treat yourself. This medicine may decrease your body's ability to fight infection. You should make sure you get enough calcium and vitamin D while you are taking this medicine, unless your doctor tells you not to. Discuss the foods you eat and the vitamins you take with your health care professional. See your dentist regularly. Brush and floss your teeth as directed. Before you have any dental work done, tell your dentist you are receiving this medicine. What side effects may I notice from receiving this medicine? Side effects that you should report to your doctor or health care professional as soon as possible: -allergic reactions like skin rash, itching or hives, swelling of the face, lips, or tongue -breathing problems -chest pain -fast, irregular heartbeat -feeling faint or lightheaded, falls -fever, chills, or any other sign of infection -muscle spasms, tightening, or twitches -numbness   or tingling -skin blisters or bumps, or is dry, peels, or red -slow healing or unexplained pain in the mouth or jaw -unusual bleeding or bruising Side effects that  usually do not require medical attention (Report these to your doctor or health care professional if they continue or are bothersome.): -muscle pain -stomach upset, gas This list may not describe all possible side effects. Call your doctor for medical advice about side effects. You may report side effects to FDA at 1-800-FDA-1088. Where should I keep my medicine? This medicine is only given in a clinic, doctor's office, or other health care setting and will not be stored at home. NOTE: This sheet is a summary. It may not cover all possible information. If you have questions about this medicine, talk to your doctor, pharmacist, or health care provider.  2013, [employer]. (04/10/2011 3:40:41 PM)  

## 2013-04-10 NOTE — Progress Notes (Addendum)
OFFICE PROGRESS NOTE  CC  [name], MD [address] [place] [address] Dr. Lurline Hare  Dr. Avel Peace  DIAGNOSIS: 57 year old female with new diagnosis of stage II a breast cancer that is triple negative    STAGE:  Cancer of lower-inner quadrant of female breast  Primary site: Breast (Right)  Staging method: AJCC 7th Edition  Clinical: Stage IIA (T2, N0, cM0)  Summary: Stage IIA (T2, N0, cM0)  PRIOR THERAPY: #1Patient's last mammogram was about 5 years ago. Recently she underwent a screening mammogram that showed a spiculated mass in the lower inner quadrant of the right breast. She had ultrasound performed that showed irregular mass at the 5:00 position 3 cm from the nipple measuring 1.1 cm. She had a needle core biopsy performed on 06/19/2012 the biopsy showed invasive ductal carcinoma with ductal carcinoma in situ grade 3 tumor was ER negative PR negative HER-2/neu negative. Ki-67 was elevated at 34%. She went on to have MRI of the breasts performed on 06/24/2012 the MRI showed in the middle third of the lower inner quadrant of the right breast a 1.5 x 1.3 x 1.9 cm irregular enhancing mass. No abnormal enhancement was seen in the left breast no enlarged axillary or internal mammary adenopathy was detected.   #2 patient is now status post bilateral mastectomies. On her right mastectomy she was found to have a 2.3 cm invasive ductal carcinoma 0/1 lymph nodes were positive for metastatic disease and this is T2 N0) stage II. Tumor was grade 3 ER 2% PR negative HER-2/neu negative with a Ki-67 at 34%. Left mastectomy only showed intraductal papilloma node atypia or malignancy was found.  #3 patient underwent adjuvant chemotherapy since her tumor is essentially triple negative and is a stage II. We discussed the rationale for adjuvant chemotherapy. She received Adriamycin Cytoxan every 2 weeks for a total of 4 cycles with G-CSF support. After this she completed  Taxol and carboplatinum every week for a total of 12 weeks. I did discuss with her use of antiestrogen therapy such as an aromatase inhibitor since her tumor is 2% ER positive. I discussed the rationale for this.  CURRENT THERAPY: observation  INTERVAL HISTORY: DYNA FIGUEREO 57 y.o. female returns for follow up today.  She is doing well on the Xarelto.  She takes Compazine prior to Xarelto and it improves her nausea.  The swelling in her right leg is much improved.  Her numbness is slightly worse in her toes, and stable in her fingertips.  She is not walking quite as well as prior, but hasn't lost balance.  This is new in the past week.  She is taking Gabapentin TID.  She is worried because she saw a lawsuit about Xarelto and numbness on TV.  She also would like her bone density results.  Otherwise, she is doing well and denies fevers, chills, nausea, vomiting, constipation, diarrhea, or further concerns.  MEDICAL HISTORY: Past Medical History  Diagnosis Date  . Thyroid disease   . Depression   . Sprue   . Diverticulosis   . Hyperlipidemia   . Breast cancer     right  . Hypothyroidism   . Pneumonia   . DVT (deep venous thrombosis)     Right lower extremity    ALLERGIES:  is allergic to ciprofloxacin and gluten meal.  MEDICATIONS:  Current Outpatient Prescriptions  Medication Sig Dispense Refill  . B Complex-C (SUPER B COMPLEX PO) Take 1 tablet by mouth daily.      [name]  gabapentin (NEURONTIN) 100 MG capsule Take 2 capsules (200 mg total) by mouth 3 (three) times daily.  180 capsule  2  . levothyroxine (SYNTHROID, LEVOTHROID) 25 MCG tablet Take 25 mcg by mouth daily.      [name] liothyronine (CYTOMEL) 25 MCG tablet Take 12.5 mcg by mouth daily.      . Multiple Vitamin (MULTIVITAMIN) tablet Take 1 tablet by mouth daily.      [name] oxyCODONE (OXY IR/ROXICODONE) 5 MG immediate release tablet Take 1 tablet (5 mg total) by mouth every 4 (four) hours as needed for pain.  30 tablet  0  .  prochlorperazine (COMPAZINE) 10 MG tablet Take 1 tablet (10 mg total) by mouth every 6 (six) hours as needed.  90 tablet  1  . Rivaroxaban (XARELTO) 20 MG TABS tablet Take 1 tablet (20 mg total) by mouth daily.  90 tablet  12  . venlafaxine XR (EFFEXOR-XR) 75 MG 24 hr capsule Take 1 capsule (75 mg total) by mouth daily.  90 capsule  3   No current facility-administered medications for this visit.    SURGICAL HISTORY:  Past Surgical History  Procedure Laterality Date  . Esophagogastroduodenoscopy  2007    sprue  . Colonoscopy  2006  . Cryoblation of cervix    . Breast lumpectomy    . Total mastectomy Bilateral 09/11/2012    Procedure: bilateral MASTECTOMY;  Surgeon: Adolph Pollack, MD;  Location: [facility] OR;  Service: General;  Laterality: Bilateral;  . Axillary sentinel node biopsy Right 09/11/2012    Procedure: AXILLARY SENTINEL lymph NODE  BIOPSY;  Surgeon: Adolph Pollack, MD;  Location: [facility] OR;  Service: General;  Laterality: Right;  right nuclear medicine injection 12:30   . Breast reconstruction with placement of tissue expander and flex hd (acellular hydrated dermis) Bilateral 09/11/2012    Procedure: BREAST RECONSTRUCTION WITH PLACEMENT OF TISSUE EXPANDER AND FLEX HD (ACELLULAR HYDRATED DERMIS) ADM;  Surgeon: Wayland Denis, DO;  Location: [facility] OR;  Service: Plastics;  Laterality: Bilateral;  . Incision and drainage of wound Left 09/16/2012    Procedure: Left Breast Evacuation of Hematoma;  Surgeon: Wayland Denis, DO;  Location: [facility] OR;  Service: Plastics;  Laterality: Left;  . Portacath placement Right 10/09/2012    Procedure: US GUIDED INSERTION PORT-A-CATH;  Surgeon: Adolph Pollack, MD;  Location: [facility];  Service: General;  Laterality: Right;  Right Subclavian Vein    REVIEW OF SYSTEMS:   A 10 point review of systems was conducted and is otherwise negative except for what is noted above.      PHYSICAL EXAMINATION: Blood pressure 116/80, pulse 87, temperature  98.4 F (36.9 C), temperature source Oral, resp. rate 20, height 5\' 5"  (1.651 m), weight 145 lb 12.8 oz (66.134 kg), last menstrual period 09/11/2012. Body mass index is 24.26 kg/(m^2). General: Patient is a well appearing female in no acute distress HEENT: PERRLA, sclerae anicteric no conjunctival pallor, MMM Neck: supple, no palpable adenopathy Lungs: clear to auscultation bilaterally, no wheezes, rhonchi, or rales Cardiovascular: regular rate rhythm, S1, S2, no murmurs, rubs or gallops, HR recheck 105 Abdomen: Soft, non-tender, non-distended, normoactive bowel sounds, no HSM Extremities: warm and well perfused, no clubbing, cyanosis, or edema Skin: No rashes or lesions, scalp erythema appears to be a birthmark Neuro: Non-focal Breasts: bilateral mastectomy sites are well healed, no nodularity, masses, or skin changes, expanders in place. ECOG PERFORMANCE STATUS: 1 - Symptomatic but completely ambulatory  LABORATORY DATA: Lab Results  Component Value  Date   WBC 2.2* 04/10/2013   HGB 11.8 04/10/2013   HCT 35.1 04/10/2013   MCV 97.6 04/10/2013   PLT 274 04/10/2013      Chemistry      Component Value Date/Time   NA 142 04/10/2013 0812   NA 138 09/15/2012 2035   K 3.9 04/10/2013 0812   K 3.6 09/15/2012 2035   CL 105 01/06/2013 0827   CL 103 09/15/2012 2035   CO2 24 04/10/2013 0812   CO2 26 09/15/2012 2035   BUN 7.2 04/10/2013 0812   BUN 4* 09/15/2012 2035   CREATININE 0.7 04/10/2013 0812   CREATININE 0.54 09/15/2012 2035      Component Value Date/Time   CALCIUM 9.5 04/10/2013 0812   CALCIUM 9.0 09/15/2012 2035   ALKPHOS 209* 04/10/2013 0812   ALKPHOS 142* 09/15/2012 2035   AST 60* 04/10/2013 0812   AST 32 09/15/2012 2035   ALT 51 04/10/2013 0812   ALT 24 09/15/2012 2035   BILITOT 0.31 04/10/2013 0812   BILITOT 0.8 09/15/2012 2035     ADDITIONAL INFORMATION: 3. CHROMOGENIC IN-SITU HYBRIDIZATION Interpretation HER-2/NEU BY CISH - NO AMPLIFICATION OF HER-2 DETECTED. THE RATIO OF HER-2: CEP 17 SIGNALS  WAS 1.52. Reference range: Ratio: HER2:CEP17 < 1.8 - gene amplification not observed Ratio: HER2:CEP 17 1.8-2.2 - equivocal result Ratio: HER2:CEP17 > 2.2 - gene amplification observed Pecola Leisure MD Pathologist, Electronic Signature ( Signed 09/22/2012) 3. PROGNOSTIC INDICATORS - ACIS Results IMMUNOHISTOCHEMICAL AND MORPHOMETRIC ANALYSIS BY THE AUTOMATED CELLULAR IMAGING SYSTEM (ACIS) Estrogen Receptor (Negative, <1%): 2%, POSITIVE, WEAK STAINING INTENSITY Progesterone Receptor (Negative, <1%): 0%, NEGATIVE COMMENT: The negative hormone receptor study in this case has an internal positive control. All controls stained appropriately Pecola Leisure MD Pathologist, Electronic Signature ( Signed 09/22/2012) 1 of 4 FINAL for Mich, Azelea A [phone]) FINAL DIAGNOSIS Diagnosis 1. Lymph node, sentinel, biopsy, Right axilla - ONE LYMPH NODE, NEGATIVE FOR TUMOR (0/1). 2. Breast, simple mastectomy, Left - BENIGN BREAST TISSUE WITH INTRADUCTAL PAPILLOMA, SEE COMMENT. - NEGATIVE FOR ATYPIA OR MALIGNANCY. - SURGICAL MARGINS, NEGATIVE FOR ATYPIA OR MALIGNANCY. - MICROCALCIFICATIONS IDENTIFIED. 3. Breast, simple mastectomy, Right - INVASIVE DUCTAL CARCINOMA, GRADE III, WITH SPINDLE CELL DIFFERENTIATION (METAPLASTIC CARCINOMA) (2.3CM), SEE COMMENT. - NO LYMPHOVASCULAR INVASION IDENTIFIED. - INVASIVE TUMOR IS 1.5 CM FROM NEAREST MARGIN (DEEP). - DUCTAL CARCINOMA IN SITU, GRADE III, WITH COMEDONECROSIS. - SEE TUMOR SYNOPTIC TEMPLATE BELOW. Microscopic Comment 2. Although there was no mass grossly identified, there are definitive morphologic features of intraductal papilloma with associated fibrocystic change present. In addition, there are fibrocystic changes with usual ductal hyperplasia in representative sections not involved by papilloma. Finally, foci of sclerosing adenosis and microcalcifications in benign ducts and lobules are present. The surgical resection margin(s) of the specimen  were inked and microscopically evaluated. 3. BREAST, INVASIVE TUMOR, WITH LYMPH NODE SAMPLING Specimen, including laterality: Right breast. Procedure: Simple mastectomy. Grade: III of III. Tubule formation: 3. Nuclear pleomorphism: 3. Mitotic: 2. Tumor size (gross measurement): 2.3 cm. Margins: Invasive, distance to closest margin: 1.5 cm. In-situ, distance to closest margin: 1.5 cm (deep). If margin positive, focally or broadly: N/A. Lymphovascular invasion: Absent. Ductal carcinoma in situ: Present. Grade: III of III. Extensive intraductal component: Absent. Lobular neoplasia: Absent. Tumor focality: Unifocal. Treatment effect: None. If present, treatment effect in breast tissue, lymph nodes or both: N/A. Extent of tumor: Skin and Nipple: Grossly negative. Skeletal muscle: N/A. Lymph nodes: # examined: 1. Lymph nodes with metastasis: 0. Breast prognostic profile: Estrogen receptor: Repeated;  previous study demonstrates 0% positivity ([id number]). Progesterone receptor: Repeated; previous study demonstrates 0% positivity ([id number]). HER-2/neu: Repeated; previous study demonstrated no amplification (1.55) ([id number]). 2 of 4 FINAL for CILICIA, BORDEN A [phone]) Microscopic Comment(continued) Ki-67: Not repeated; previous study demonstrates 34% proliferation rate [phone]). Non-neoplastic breast: Intraductal papilloma with fibrocystic change and usual ductal hyperplasia, fibrocystic change with usual ductal hyperplasia, microcalcifications, and previous biopsy site. TNM: pT2, pN0, pMX. Comments: Although there are definitive features of high grade invasive ductal carcinoma identified, there are multiple foci where the carcinoma assumes a spindle cell morphology / spindle cell differentiation. 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 There are no heterologous elements identified.  (CRR:eps 09/15/12)  RADIOGRAPHIC STUDIES:  Chest 2 View  09/08/2012  *RADIOLOGY REPORT*  Clinical Data: Right-sided breast  CHEST - 2 VIEW  Comparison: Chest x-ray 12/06/2009.  Findings: Lung volumes are normal.  No consolidative airspace disease.  No pleural effusions.  No pneumothorax.  No pulmonary nodule or mass noted.  Pulmonary vasculature and the cardiomediastinal silhouette are within normal limits.  Mild bilateral apical pleuroparenchymal thickening most compatible with chronic scarring, unchanged compared to the prior examination.  IMPRESSION: 1. No radiographic evidence of acute cardiopulmonary disease.   Original Report Authenticated By: Trudie Reed, M.D.    Nm Sentinel Node Inj-no Rpt (breast)  09/11/2012  CLINICAL DATA: Invasive right breast cancer   Sulfur colloid was injected intradermally by the nuclear medicine  technologist for breast cancer sentinel node localization.      ASSESSMENT: 57 year old female with  #1 invasive ductal carcinoma of the right breast status post bilateral mastectomies for a 2.3 cm node negative essentially triple negative disease on the right. Without any evidence of malignancy on the left. Patient underwent 4 cycles of Adriamycin/Cytoxan followed by 12 cycles of Taxol/Carboplatin.  Since her tumor was 2% estrogen receptor positive we will utilize an aromatase inhibitor for 5 years. She understands the rationale for this.    #2 patient is also having considerable anxiety and will continue to take Effexor daily.    #3 Right lower extremity DVT: on Xarelto therapy  #4Neuropathy: Gabapentin and Super B complex.    #5 Osteoporosis: Discussion of bisphosphanates underway  PLAN:   1.  Doing well.  She is still mildly neutropenic with an ANC of 700.  Her other labs have improved.    2.  She will continue Effexor and is doing well with this.    3. Patient will continue Xarelto.  Her right leg continues to feel improved, and she is tolerating the  medication well.  She does take Compazine prior taking the Xarelto, and it alleviates nausea.    4.  Patient will continue Gabapentin TID for her neuropathy.    5.  I discussed bisphosphanate therapy and calcium, vitamin d intake, and weight bearing exercises to improve her bone density.    6.  Patient will return in one month for labs, evaluation and Prolia.    All questions were answered. The patient knows to call the clinic with any problems, questions or concerns. We can certainly see the patient much sooner if necessary.  I spent 25 minutes counseling the patient face to face. The total time spent in the appointment was 30 minutes.   Cherie Ouch Lyn Hollingshead, NP Medical Oncology [facility] Phone: [phone] 04/11/2013, 9:01 AM  ATTENDING'S ATTESTATION:  Clinically patient seems to be doing well. She does continue to  have neuropathy and she is on gabapentin we will continue this. She developed some nausea with her anticoagulant and she does take some antibiotics which help. She is having hot flashes and she is on Effexor. She is currently on observation only from her breast cancer perspective.  Drue Second, MD Medical/Oncology [facility] [phone] (beeper) [phone] (Office)  05/04/2013, 9:47 AM

## 2013-04-11 LAB — VITAMIN D 25 HYDROXY (VIT D DEFICIENCY, FRACTURES): Vit D, 25-Hydroxy: 43 ng/mL (ref 30–89)

## 2013-04-17 ENCOUNTER — Encounter: Payer: Self-pay | Admitting: Oncology

## 2013-04-17 NOTE — Progress Notes (Signed)
Faxed disability questionaire and clinical information to [place] @ [phone].

## 2013-04-27 ENCOUNTER — Other Ambulatory Visit: Payer: Self-pay | Admitting: *Deleted

## 2013-04-27 DIAGNOSIS — G629 Polyneuropathy, unspecified: Secondary | ICD-10-CM

## 2013-04-27 MED ORDER — GABAPENTIN 100 MG PO CAPS: 200.0000 mg | ORAL_CAPSULE | Freq: Three times a day (TID) | ORAL | Status: DC

## 2013-05-13 ENCOUNTER — Telehealth: Payer: Self-pay | Admitting: Oncology

## 2013-05-13 ENCOUNTER — Other Ambulatory Visit (HOSPITAL_BASED_OUTPATIENT_CLINIC_OR_DEPARTMENT_OTHER): Payer: Private Health Insurance - Indemnity | Admitting: Lab

## 2013-05-13 ENCOUNTER — Ambulatory Visit (HOSPITAL_BASED_OUTPATIENT_CLINIC_OR_DEPARTMENT_OTHER): Payer: Private Health Insurance - Indemnity | Admitting: Adult Health

## 2013-05-13 ENCOUNTER — Ambulatory Visit: Payer: Managed Care, Other (non HMO)

## 2013-05-13 ENCOUNTER — Telehealth: Payer: Self-pay

## 2013-05-13 ENCOUNTER — Ambulatory Visit (HOSPITAL_BASED_OUTPATIENT_CLINIC_OR_DEPARTMENT_OTHER): Payer: Private Health Insurance - Indemnity

## 2013-05-13 ENCOUNTER — Encounter: Payer: Self-pay | Admitting: Adult Health

## 2013-05-13 VITALS — BP 126/79 | HR 79 | Temp 97.1°F | Resp 18 | Ht 65.0 in | Wt 147.4 lb

## 2013-05-13 DIAGNOSIS — C50319 Malignant neoplasm of lower-inner quadrant of unspecified female breast: Secondary | ICD-10-CM

## 2013-05-13 DIAGNOSIS — C50311 Malignant neoplasm of lower-inner quadrant of right female breast: Secondary | ICD-10-CM

## 2013-05-13 DIAGNOSIS — I82401 Acute embolism and thrombosis of unspecified deep veins of right lower extremity: Secondary | ICD-10-CM

## 2013-05-13 DIAGNOSIS — Z452 Encounter for adjustment and management of vascular access device: Secondary | ICD-10-CM

## 2013-05-13 DIAGNOSIS — Z9010 Acquired absence of unspecified breast and nipple: Secondary | ICD-10-CM

## 2013-05-13 DIAGNOSIS — M810 Age-related osteoporosis without current pathological fracture: Secondary | ICD-10-CM

## 2013-05-13 DIAGNOSIS — F411 Generalized anxiety disorder: Secondary | ICD-10-CM

## 2013-05-13 DIAGNOSIS — I82409 Acute embolism and thrombosis of unspecified deep veins of unspecified lower extremity: Secondary | ICD-10-CM

## 2013-05-13 DIAGNOSIS — R7401 Elevation of levels of liver transaminase levels: Secondary | ICD-10-CM

## 2013-05-13 DIAGNOSIS — Z171 Estrogen receptor negative status [ER-]: Secondary | ICD-10-CM

## 2013-05-13 DIAGNOSIS — G609 Hereditary and idiopathic neuropathy, unspecified: Secondary | ICD-10-CM

## 2013-05-13 LAB — COMPREHENSIVE METABOLIC PANEL (CC13)
ALT: 58 U/L — ABNORMAL HIGH (ref 0–55)
AST: 59 U/L — ABNORMAL HIGH (ref 5–34)
Albumin: 3.6 g/dL (ref 3.5–5.0)
Alkaline Phosphatase: 172 U/L — ABNORMAL HIGH (ref 40–150)
Anion Gap: 9 mEq/L (ref 3–11)
BUN: 10.4 mg/dL (ref 7.0–26.0)
CO2: 22 mEq/L (ref 22–29)
Calcium: 9.5 mg/dL (ref 8.4–10.4)
Chloride: 110 mEq/L — ABNORMAL HIGH (ref 98–109)
Creatinine: 0.7 mg/dL (ref 0.6–1.1)
Glucose: 103 mg/dl (ref 70–140)
Potassium: 3.8 mEq/L (ref 3.5–5.1)
Sodium: 141 mEq/L (ref 136–145)
Total Bilirubin: 0.47 mg/dL (ref 0.20–1.20)
Total Protein: 7.2 g/dL (ref 6.4–8.3)

## 2013-05-13 LAB — CBC WITH DIFFERENTIAL/PLATELET
BASO%: 0.6 % (ref 0.0–2.0)
Basophils Absolute: 0 10*3/uL (ref 0.0–0.1)
EOS%: 4.5 % (ref 0.0–7.0)
Eosinophils Absolute: 0.2 10*3/uL (ref 0.0–0.5)
HCT: 37 % (ref 34.8–46.6)
HGB: 12.2 g/dL (ref 11.6–15.9)
LYMPH%: 34.7 % (ref 14.0–49.7)
MCH: 30.1 pg (ref 25.1–34.0)
MCHC: 33 g/dL (ref 31.5–36.0)
MCV: 91.4 fL (ref 79.5–101.0)
MONO#: 0.5 10*3/uL (ref 0.1–0.9)
MONO%: 13.6 % (ref 0.0–14.0)
NEUT#: 1.6 10*3/uL (ref 1.5–6.5)
NEUT%: 46.6 % (ref 38.4–76.8)
Platelets: 220 10*3/uL (ref 145–400)
RBC: 4.05 10*6/uL (ref 3.70–5.45)
RDW: 13.9 % (ref 11.2–14.5)
WBC: 3.4 10*3/uL — ABNORMAL LOW (ref 3.9–10.3)
lymph#: 1.2 10*3/uL (ref 0.9–3.3)

## 2013-05-13 MED ORDER — SODIUM CHLORIDE 0.9 % IJ SOLN
10.0000 mL | INTRAMUSCULAR | Status: DC | PRN
  Administered 2013-05-13: 10 mL via INTRAVENOUS
  Filled 2013-05-13: qty 10

## 2013-05-13 MED ORDER — DENOSUMAB 60 MG/ML ~~LOC~~ SOLN: 60.0000 mg | Freq: Once | SUBCUTANEOUS | Status: DC

## 2013-05-13 MED ORDER — HEPARIN SOD (PORK) LOCK FLUSH 100 UNIT/ML IV SOLN
500.0000 [IU] | Freq: Once | INTRAVENOUS | Status: AC
  Administered 2013-05-13: 500 [IU] via INTRAVENOUS
  Filled 2013-05-13: qty 5

## 2013-05-13 NOTE — Patient Instructions (Signed)
Implanted Port Instructions  An implanted port is a central line that has a round shape and is placed under the skin. It is used for long-term IV (intravenous) access for:  · Medicine.  · Fluids.  · Liquid nutrition, such as TPN (total parenteral nutrition).  · Blood samples.  Ports can be placed:  · In the chest area just below the collarbone (this is the most common place.)  · In the arms.  · In the belly (abdomen) area.  · In the legs.  PARTS OF THE PORT  A port has 2 main parts:  · The reservoir. The reservoir is round, disc-shaped, and will be a small, raised area under your skin.  · The reservoir is the part where a needle is inserted (accessed) to either give medicines or to draw blood.  · The catheter. The catheter is a long, slender tube that extends from the reservoir. The catheter is placed into a large vein.  · Medicine that is inserted into the reservoir goes into the catheter and then into the vein.  INSERTION OF THE PORT  · The port is surgically placed in either an operating room or in a procedural area (interventional radiology).  · Medicine may be given to help you relax during the procedure.  · The skin where the port will be inserted is numbed (local anesthetic).  · 1 or 2 small cuts (incisions) will be made in the skin to insert the port.  · The port can be used after it has been inserted.  INCISION SITE CARE  · The incision site may have small adhesive strips on it. This helps keep the incision site closed. Sometimes, no adhesive strips are placed. Instead of adhesive strips, a special kind of surgical glue is used to keep the incision closed.  · If adhesive strips were placed on the incision sites, do not take them off. They will fall off on their own.  · The incision site may be sore for 1 to 2 days. Pain medicine can help.  · Do not get the incision site wet. Bathe or shower as directed by your caregiver.  · The incision site should heal in 5 to 7 days. A small scar may form after the  incision has healed.  ACCESSING THE PORT  Special steps must be taken to access the port:  · Before the port is accessed, a numbing cream can be placed on the skin. This helps numb the skin over the port site.  · A sterile technique is used to access the port.  · The port is accessed with a needle. Only "non-coring" port needles should be used to access the port. Once the port is accessed, a blood return should be checked. This helps ensure the port is in the vein and is not clogged (clotted).  · If your caregiver believes your port should remain accessed, a clear (transparent) bandage will be placed over the needle site. The bandage and needle will need to be changed every week or as directed by your caregiver.  · Keep the bandage covering the needle clean and dry. Do not get it wet. Follow your caregiver's instructions on how to take a shower or bath when the port is accessed.  · If your port does not need to stay accessed, no bandage is needed over the port.  FLUSHING THE PORT  Flushing the port keeps it from getting clogged. How often the port is flushed depends on:  · If a   constant infusion is running. If a constant infusion is running, the port may not need to be flushed.  · If intermittent medicines are given.  · If the port is not being used.  For intermittent medicines:  · The port will need to be flushed:  · After medicines have been given.  · After blood has been drawn.  · As part of routine maintenance.  · A port is normally flushed with:  · Normal saline.  · Heparin.  · Follow your caregiver's advice on how often, how much, and the type of flush to use on your port.  IMPORTANT PORT INFORMATION  · Tell your caregiver if you are allergic to heparin.  · After your port is placed, you will get a manufacturer's information card. The card has information about your port. Keep this card with you at all times.  · There are many types of ports available. Know what kind of port you have.  · In case of an  emergency, it may be helpful to wear a medical alert bracelet. This can help alert health care workers that you have a port.  · The port can stay in for as long as your caregiver believes it is necessary.  · When it is time for the port to come out, surgery will be done to remove it. The surgery will be similar to how the port was put in.  · If you are in the hospital or clinic:  · Your port will be taken care of and flushed by a nurse.  · If you are at home:  · A home health care nurse may give medicines and take care of the port.  · You or a family member can get special training and directions for giving medicine and taking care of the port at home.  SEEK IMMEDIATE MEDICAL CARE IF:   · Your port does not flush or you are unable to get a blood return.  · New drainage or pus is coming from the incision.  · A bad smell is coming from the incision site.  · You develop swelling or increased redness at the incision site.  · You develop increased swelling or pain at the port site.  · You develop swelling or pain in the surrounding skin near the port.  · You have an oral temperature above 102° F (38.9° C), not controlled by medicine.  MAKE SURE YOU:   · Understand these instructions.  · Will watch your condition.  · Will get help right away if you are not doing well or get worse.  Document Released: 07/02/2005 Document Revised: 09/24/2011 Document Reviewed: 09/23/2008  [employer]® Patient Information ©2014 [employer].

## 2013-05-13 NOTE — Telephone Encounter (Signed)
Left message that we were sorry we did not give the pt her prolia while she was in the office today.  Informed her that it was out understanding it had not been prior authorized yet and they would call her back to schedule it once it had been authorized per Mardella Layman NP.  Encouraged pt to call back with any questions.

## 2013-05-13 NOTE — Progress Notes (Addendum)
OFFICE PROGRESS NOTE  CC  [name], MD [address] [place] [address] Dr. Lurline Hare  Dr. Avel Peace  DIAGNOSIS: 57 year old female with new diagnosis of stage II a breast cancer that is triple negative    STAGE:  Cancer of lower-inner quadrant of female breast  Primary site: Breast (Right)  Staging method: AJCC 7th Edition  Clinical: Stage IIA (T2, N0, cM0)  Summary: Stage IIA (T2, N0, cM0)  PRIOR THERAPY: #1Patient's last mammogram was about 5 years ago. Recently she underwent a screening mammogram that showed a spiculated mass in the lower inner quadrant of the right breast. She had ultrasound performed that showed irregular mass at the 5:00 position 3 cm from the nipple measuring 1.1 cm. She had a needle core biopsy performed on 06/19/2012 the biopsy showed invasive ductal carcinoma with ductal carcinoma in situ grade 3 tumor was ER negative PR negative HER-2/neu negative. Ki-67 was elevated at 34%. She went on to have MRI of the breasts performed on 06/24/2012 the MRI showed in the middle third of the lower inner quadrant of the right breast a 1.5 x 1.3 x 1.9 cm irregular enhancing mass. No abnormal enhancement was seen in the left breast no enlarged axillary or internal mammary adenopathy was detected.   #2 patient is now status post bilateral mastectomies. On her right mastectomy she was found to have a 2.3 cm invasive ductal carcinoma 0/1 lymph nodes were positive for metastatic disease and this is T2 N0) stage II. Tumor was grade 3 ER 2% PR negative HER-2/neu negative with a Ki-67 at 34%. Left mastectomy only showed intraductal papilloma node atypia or malignancy was found.  #3 patient underwent adjuvant chemotherapy since her tumor is essentially triple negative and is a stage II. We discussed the rationale for adjuvant chemotherapy. She received Adriamycin Cytoxan every 2 weeks for a total of 4 cycles with G-CSF support. After this she completed  Taxol and carboplatinum every week for a total of 12 weeks. I did discuss with her use of antiestrogen therapy such as an aromatase inhibitor since her tumor is 2% ER positive. I discussed the rationale for this.  CURRENT THERAPY: observation  INTERVAL HISTORY: Nancy Beard 57 y.o. female returns for follow up today.  She is doing well today.  Her numbness is improved, it is intermittent in nature.  She denies fevers, chills, nausea, vomiting, constipation, diarrhea.  She has questions about the prolia that we discussed and prior authorization was requested at her last appointment.    MEDICAL HISTORY: Past Medical History  Diagnosis Date  . Thyroid disease   . Depression   . Sprue   . Diverticulosis   . Hyperlipidemia   . Breast cancer     right  . Hypothyroidism   . Pneumonia   . DVT (deep venous thrombosis)     Right lower extremity    ALLERGIES:  is allergic to ciprofloxacin and gluten meal.  MEDICATIONS:  Current Outpatient Prescriptions  Medication Sig Dispense Refill  . B Complex-C (SUPER B COMPLEX PO) Take 1 tablet by mouth daily.      [name] gabapentin (NEURONTIN) 100 MG capsule Take 2 capsules (200 mg total) by mouth 3 (three) times daily.  540 capsule  0  . levothyroxine (SYNTHROID, LEVOTHROID) 25 MCG tablet Take 25 mcg by mouth daily.      [name] liothyronine (CYTOMEL) 25 MCG tablet Take 12.5 mcg by mouth daily.      . Multiple Vitamin (MULTIVITAMIN) tablet  Take 1 tablet by mouth daily.      . prochlorperazine (COMPAZINE) 10 MG tablet Take 1 tablet (10 mg total) by mouth every 6 (six) hours as needed.  90 tablet  1  . Rivaroxaban (XARELTO) 20 MG TABS tablet Take 1 tablet (20 mg total) by mouth daily.  90 tablet  12  . venlafaxine XR (EFFEXOR-XR) 75 MG 24 hr capsule Take 1 capsule (75 mg total) by mouth daily.  90 capsule  3  . oxyCODONE (OXY IR/ROXICODONE) 5 MG immediate release tablet Take 1 tablet (5 mg total) by mouth every 4 (four) hours as needed for pain.  30 tablet  0    No current facility-administered medications for this visit.    SURGICAL HISTORY:  Past Surgical History  Procedure Laterality Date  . Esophagogastroduodenoscopy  2007    sprue  . Colonoscopy  2006  . Cryoblation of cervix    . Breast lumpectomy    . Total mastectomy Bilateral 09/11/2012    Procedure: bilateral MASTECTOMY;  Surgeon: Adolph Pollack, MD;  Location: [facility] OR;  Service: General;  Laterality: Bilateral;  . Axillary sentinel node biopsy Right 09/11/2012    Procedure: AXILLARY SENTINEL lymph NODE  BIOPSY;  Surgeon: Adolph Pollack, MD;  Location: [facility] OR;  Service: General;  Laterality: Right;  right nuclear medicine injection 12:30   . Breast reconstruction with placement of tissue expander and flex hd (acellular hydrated dermis) Bilateral 09/11/2012    Procedure: BREAST RECONSTRUCTION WITH PLACEMENT OF TISSUE EXPANDER AND FLEX HD (ACELLULAR HYDRATED DERMIS) ADM;  Surgeon: Wayland Denis, DO;  Location[facility] OR;  Service: Plastics;  Laterality: Bilateral;  . Incision and drainage of wound Left 09/16/2012    Procedure: Left Breast Evacuation of Hematoma;  Surgeon: Wayland Denis, DO;  Location: [facility] OR;  Service: Plastics;  Laterality: Left;  . Portacath placement Right 10/09/2012    Procedure: US GUIDED INSERTION PORT-A-CATH;  Surgeon: Adolph Pollack, MD;  Location: [facility];  Service: General;  Laterality: Right;  Right Subclavian Vein    REVIEW OF SYSTEMS:   A 10 point review of systems was conducted and is otherwise negative except for what is noted above.      PHYSICAL EXAMINATION: Blood pressure 126/79, pulse 79, temperature 97.1 F (36.2 C), temperature source Oral, resp. rate 18, height 5\' 5"  (1.651 m), weight 147 lb 6.4 oz (66.86 kg), last menstrual period 09/11/2012. Body mass index is 24.53 kg/(m^2). General: Patient is a well appearing female in no acute distress HEENT: PERRLA, sclerae anicteric no conjunctival pallor, MMM Neck: supple, no palpable  adenopathy Lungs: clear to auscultation bilaterally, no wheezes, rhonchi, or rales Cardiovascular: regular rate rhythm, S1, S2, no murmurs, rubs or gallops Abdomen: Soft, non-tender, non-distended, normoactive bowel sounds, no HSM Extremities: warm and well perfused, no clubbing, cyanosis, or edema Skin: No rashes or lesions, scalp erythema appears to be a birthmark Neuro: Non-focal Breasts: bilateral mastectomy sites are well healed, no nodularity, masses, or skin changes, expanders in place. ECOG PERFORMANCE STATUS: 1 - Symptomatic but completely ambulatory  LABORATORY DATA: Lab Results  Component Value Date   WBC 3.4* 05/13/2013   HGB 12.2 05/13/2013   HCT 37.0 05/13/2013   MCV 91.4 05/13/2013   PLT 220 05/13/2013      Chemistry      Component Value Date/Time   NA 141 05/13/2013 0801   NA 138 09/15/2012 2035   K 3.8 05/13/2013 0801   K 3.6 09/15/2012 2035  CL 105 01/06/2013 0827   CL 103 09/15/2012 2035   CO2 22 05/13/2013 0801   CO2 26 09/15/2012 2035   BUN 10.4 05/13/2013 0801   BUN 4* 09/15/2012 2035   CREATININE 0.7 05/13/2013 0801   CREATININE 0.54 09/15/2012 2035      Component Value Date/Time   CALCIUM 9.5 05/13/2013 0801   CALCIUM 9.0 09/15/2012 2035   ALKPHOS 172* 05/13/2013 0801   ALKPHOS 142* 09/15/2012 2035   AST 59* 05/13/2013 0801   AST 32 09/15/2012 2035   ALT 58* 05/13/2013 0801   ALT 24 09/15/2012 2035   BILITOT 0.47 05/13/2013 0801   BILITOT 0.8 09/15/2012 2035     ADDITIONAL INFORMATION: 3. CHROMOGENIC IN-SITU HYBRIDIZATION Interpretation HER-2/NEU BY CISH - NO AMPLIFICATION OF HER-2 DETECTED. THE RATIO OF HER-2: CEP 17 SIGNALS WAS 1.52. Reference range: Ratio: HER2:CEP17 < 1.8 - gene amplification not observed Ratio: HER2:CEP 17 1.8-2.2 - equivocal result Ratio: HER2:CEP17 > 2.2 - gene amplification observed Pecola Leisure MD Pathologist, Electronic Signature ( Signed 09/22/2012) 3. PROGNOSTIC INDICATORS - ACIS Results IMMUNOHISTOCHEMICAL AND MORPHOMETRIC  ANALYSIS BY THE AUTOMATED CELLULAR IMAGING SYSTEM (ACIS) Estrogen Receptor (Negative, <1%): 2%, POSITIVE, WEAK STAINING INTENSITY Progesterone Receptor (Negative, <1%): 0%, NEGATIVE COMMENT: The negative hormone receptor study in this case has an internal positive control. All controls stained appropriately Pecola Leisure MD Pathologist, Electronic Signature ( Signed 09/22/2012) 1 of 4 FINAL for Courts, Martie A [phone]) FINAL DIAGNOSIS Diagnosis 1. Lymph node, sentinel, biopsy, Right axilla - ONE LYMPH NODE, NEGATIVE FOR TUMOR (0/1). 2. Breast, simple mastectomy, Left - BENIGN BREAST TISSUE WITH INTRADUCTAL PAPILLOMA, SEE COMMENT. - NEGATIVE FOR ATYPIA OR MALIGNANCY. - SURGICAL MARGINS, NEGATIVE FOR ATYPIA OR MALIGNANCY. - MICROCALCIFICATIONS IDENTIFIED. 3. Breast, simple mastectomy, Right - INVASIVE DUCTAL CARCINOMA, GRADE III, WITH SPINDLE CELL DIFFERENTIATION (METAPLASTIC CARCINOMA) (2.3CM), SEE COMMENT. - NO LYMPHOVASCULAR INVASION IDENTIFIED. - INVASIVE TUMOR IS 1.5 CM FROM NEAREST MARGIN (DEEP). - DUCTAL CARCINOMA IN SITU, GRADE III, WITH COMEDONECROSIS. - SEE TUMOR SYNOPTIC TEMPLATE BELOW. Microscopic Comment 2. Although there was no mass grossly identified, there are definitive morphologic features of intraductal papilloma with associated fibrocystic change present. In addition, there are fibrocystic changes with usual ductal hyperplasia in representative sections not involved by papilloma. Finally, foci of sclerosing adenosis and microcalcifications in benign ducts and lobules are present. The surgical resection margin(s) of the specimen were inked and microscopically evaluated. 3. BREAST, INVASIVE TUMOR, WITH LYMPH NODE SAMPLING Specimen, including laterality: Right breast. Procedure: Simple mastectomy. Grade: III of III. Tubule formation: 3. Nuclear pleomorphism: 3. Mitotic: 2. Tumor size (gross measurement): 2.3 cm. Margins: Invasive, distance to closest  margin: 1.5 cm. In-situ, distance to closest margin: 1.5 cm (deep). If margin positive, focally or broadly: N/A. Lymphovascular invasion: Absent. Ductal carcinoma in situ: Present. Grade: III of III. Extensive intraductal component: Absent. Lobular neoplasia: Absent. Tumor focality: Unifocal. Treatment effect: None. If present, treatment effect in breast tissue, lymph nodes or both: N/A. Extent of tumor: Skin and Nipple: Grossly negative. Skeletal muscle: N/A. Lymph nodes: # examined: 1. Lymph nodes with metastasis: 0. Breast prognostic profile: Estrogen receptor: Repeated; previous study demonstrates 0% positivity ([id number]). Progesterone receptor: Repeated; previous study demonstrates 0% positivity ([id number]). HER-2/neu: Repeated; previous study demonstrated no amplification (1.55) ([id number]). 2 of 4 FINAL for MOUNA, YAGER A [phone]) Microscopic Comment(continued) Ki-67: Not repeated; previous study demonstrates 34% proliferation rate [phone]). Non-neoplastic breast: Intraductal papilloma with fibrocystic change and usual ductal hyperplasia, fibrocystic change with usual ductal hyperplasia, microcalcifications, and previous biopsy  site. TNM: pT2, pN0, pMX. Comments: Although there are definitive features of high grade invasive ductal carcinoma identified, there are multiple foci where the carcinoma assumes a spindle cell morphology / spindle cell differentiation. 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 There are no heterologous elements identified. (CRR:eps 09/15/12)  RADIOGRAPHIC STUDIES:  Chest 2 View  09/08/2012  *RADIOLOGY REPORT*  Clinical Data: Right-sided breast  CHEST - 2 VIEW  Comparison: Chest x-ray 12/06/2009.  Findings: Lung volumes are normal.  No consolidative airspace disease.  No pleural effusions.  No pneumothorax.  No pulmonary nodule or mass noted.  Pulmonary  vasculature and the cardiomediastinal silhouette are within normal limits.  Mild bilateral apical pleuroparenchymal thickening most compatible with chronic scarring, unchanged compared to the prior examination.  IMPRESSION: 1. No radiographic evidence of acute cardiopulmonary disease.   Original Report Authenticated By: Trudie Reed, M.D.    Nm Sentinel Node Inj-no Rpt (breast)  09/11/2012  CLINICAL DATA: Invasive right breast cancer   Sulfur colloid was injected intradermally by the nuclear medicine  technologist for breast cancer sentinel node localization.      ASSESSMENT: 57 year old female with  #1 invasive ductal carcinoma of the right breast status post bilateral mastectomies for a 2.3 cm node negative essentially triple negative disease on the right. Without any evidence of malignancy on the left. Patient underwent 4 cycles of Adriamycin/Cytoxan followed by 12 cycles of Taxol/Carboplatin.  Since her tumor was 2% estrogen receptor positive we will utilize an aromatase inhibitor for 5 years. She understands the rationale for this.    #2 patient is also having considerable anxiety and will continue to take Effexor daily.    #3 Right lower extremity DVT: on Xarelto therapy  #4Neuropathy: Gabapentin and Super B complex.    #5 Osteoporosis: Discussion of bisphosphanates underway  PLAN:   1.  Doing well.  Her labs have recovered.  I have discussed this with her in detail.  She will begin bisphosphanates soon, as the prior authorization for her Prolia is still pending.  She will start an aromatase inhibitor once she starts bisphonsphanate therapy.      2.  She will continue Effexor and is doing well with this.    3. Patient will continue Xarelto.  Her right leg continues to feel improved, and she is tolerating the medication well.  She does take Compazine prior taking the Xarelto, and it alleviates nausea.  We will repeat a doppler of her leg today.  She will let us know the date of her  final reconstruction surgery and we will give her instructions for her [place].      4.  Patient will continue Gabapentin TID for her neuropathy.    5.  I discussed bisphosphanate therapy and calcium, vitamin d intake, and weight bearing exercises to improve her bone density.    6.  She has had mild transaminitis with her recent labs.  Due to this she will have an ultrasound of the abdomen.  She says that she will start back her gluten free diet as this has happened in the past.    7.  She will return in 3 months for follow up.    All questions were answered. The patient knows to call the clinic with any problems, questions or concerns. We can certainly see the patient much sooner if necessary.  I spent 25 minutes counseling the patient face to face. The total time spent in  the appointment was 30 minutes.   Illa Level, NP Medical Oncology [facility] [phone]   05/13/2013, 9:23 AM

## 2013-05-14 ENCOUNTER — Telehealth: Payer: Self-pay | Admitting: *Deleted

## 2013-05-14 ENCOUNTER — Other Ambulatory Visit (HOSPITAL_COMMUNITY): Payer: Self-pay | Admitting: Oncology

## 2013-05-14 ENCOUNTER — Other Ambulatory Visit: Payer: Self-pay | Admitting: Emergency Medicine

## 2013-05-14 ENCOUNTER — Ambulatory Visit (HOSPITAL_COMMUNITY)
Admission: RE | Admit: 2013-05-14 | Discharge: 2013-05-14 | Disposition: A | Discharge status: Home / Self Care | Payer: Private Health Insurance - Indemnity | Source: Ambulatory Visit | Attending: Internal Medicine | Admitting: Internal Medicine

## 2013-05-14 ENCOUNTER — Ambulatory Visit (HOSPITAL_COMMUNITY)
Admission: RE | Admit: 2013-05-14 | Discharge: 2013-05-14 | Disposition: A | Discharge status: Home / Self Care | Payer: Private Health Insurance - Indemnity | Source: Ambulatory Visit | Attending: Oncology | Admitting: Oncology

## 2013-05-14 DIAGNOSIS — I824Y9 Acute embolism and thrombosis of unspecified deep veins of unspecified proximal lower extremity: Secondary | ICD-10-CM | POA: Insufficient documentation

## 2013-05-14 DIAGNOSIS — I82409 Acute embolism and thrombosis of unspecified deep veins of unspecified lower extremity: Secondary | ICD-10-CM

## 2013-05-14 DIAGNOSIS — M79609 Pain in unspecified limb: Secondary | ICD-10-CM

## 2013-05-14 DIAGNOSIS — I82401 Acute embolism and thrombosis of unspecified deep veins of right lower extremity: Secondary | ICD-10-CM

## 2013-05-14 MED ORDER — GENTAMICIN SULFATE 0.3 % OP SOLN: 1.0000 [drp] | OPHTHALMIC | Status: DC

## 2013-05-14 NOTE — Progress Notes (Signed)
VASCULAR LAB PRELIMINARY  PRELIMINARY  PRELIMINARY  PRELIMINARY  Right lower extremity venous duplex completed.    Preliminary report:  Follow-up evaluation of previous right lower exremity DVT. Exam revealed subacute DVT in the profunda and popliteal veins with early recanalization. The DVt of the femoral, peroneal, and posterior tibial veins appear to have resolved  Pattrick Bady, RVS 05/14/2013, 1:54 PM

## 2013-05-14 NOTE — Telephone Encounter (Signed)
PT. STILL HAS A DVT IN THE POPLITEAL AND PROFUNDA DISTAL. VERBAL ORDER AND READ BACK TO LINDSEY CORNETTO,NP- PT. MAY GO HOME. NO FURTHER INSTRUCTIONS. NOTIFIED [place].

## 2013-05-15 ENCOUNTER — Encounter: Payer: Self-pay | Admitting: Oncology

## 2013-05-18 ENCOUNTER — Encounter: Payer: Self-pay | Admitting: Adult Health

## 2013-05-18 NOTE — Progress Notes (Signed)
This encounter was created in error - please disregard.

## 2013-05-19 ENCOUNTER — Ambulatory Visit (HOSPITAL_COMMUNITY)
Admission: RE | Admit: 2013-05-19 | Discharge: 2013-05-19 | Disposition: A | Discharge status: Home / Self Care | Payer: Private Health Insurance - Indemnity | Source: Ambulatory Visit | Attending: Adult Health | Admitting: Adult Health

## 2013-05-19 ENCOUNTER — Other Ambulatory Visit: Payer: Self-pay | Admitting: Emergency Medicine

## 2013-05-19 ENCOUNTER — Telehealth: Payer: Self-pay | Admitting: Adult Health

## 2013-05-19 DIAGNOSIS — R7401 Elevation of levels of liver transaminase levels: Secondary | ICD-10-CM

## 2013-05-19 DIAGNOSIS — Z853 Personal history of malignant neoplasm of breast: Secondary | ICD-10-CM | POA: Insufficient documentation

## 2013-05-19 DIAGNOSIS — R7402 Elevation of levels of lactic acid dehydrogenase (LDH): Secondary | ICD-10-CM | POA: Insufficient documentation

## 2013-05-19 DIAGNOSIS — M810 Age-related osteoporosis without current pathological fracture: Secondary | ICD-10-CM

## 2013-05-19 DIAGNOSIS — C50919 Malignant neoplasm of unspecified site of unspecified female breast: Secondary | ICD-10-CM

## 2013-05-19 MED ORDER — ANASTROZOLE 1 MG PO TABS
1.0000 mg | ORAL_TABLET | Freq: Every day | ORAL | Status: DC
Start: 2013-05-19 — End: 2013-07-03

## 2013-05-19 MED ORDER — ALENDRONATE SODIUM 70 MG PO TABS: 70.0000 mg | ORAL_TABLET | ORAL | Status: DC

## 2013-05-19 NOTE — Progress Notes (Signed)
PER LINDA, RN PATIENT HAS TO HAVE A CONTRAINDICATION TO AT LEAST 2 ORAL BISPHOSPHONATES BEFORE SHE CAN HAVE PROLIA. REF #[id number] PER MEDICAL Sherrell Puller, MD.HER PHONE NUMBER IS [phone].

## 2013-05-19 NOTE — Telephone Encounter (Signed)
Called patient to inform her that abdominal ultrasound was normal.  Also informed her that we were unable to get Prolia prior authorized, and she would need to take Fosamax every week.  She will do this, I informed her of risks/benefits along with adverse effects.  I also informed her that now that she is on bisphosphanate therapy we can start Arimidex daily.  I discussed adverse effects, risk/benefits with her.  She states she called yesterday and left a voice mail informing us of her reconstruction date.  I informed her that I would touch base with Dr. Welton Flakes regarding tapering her off the Xarelto and cal her back.    Illa Level, NP Medical Oncology [facility] [phone]

## 2013-05-20 ENCOUNTER — Ambulatory Visit: Payer: Private Health Insurance - Indemnity

## 2013-05-21 ENCOUNTER — Other Ambulatory Visit: Payer: Self-pay

## 2013-05-26 ENCOUNTER — Telehealth: Payer: Self-pay | Admitting: *Deleted

## 2013-05-26 DIAGNOSIS — I82401 Acute embolism and thrombosis of unspecified deep veins of right lower extremity: Secondary | ICD-10-CM

## 2013-05-26 MED ORDER — RIVAROXABAN 20 MG PO TABS: 20.0000 mg | ORAL_TABLET | Freq: Every day | ORAL | Status: DC

## 2013-05-26 NOTE — Telephone Encounter (Signed)
Call from pt requesting refill on Xarelto.  NP note on 11/4 to taper pt off Xarelto. Will review with provider for further clarification.  Last seen 10/29,  Next f/u with NP/MD 07/06/13

## 2013-05-26 NOTE — Telephone Encounter (Signed)
She can have a refill on the xeralto. But explain that she needs to let us know when her reconstruction is so that we can manage the anti-coagulation

## 2013-05-26 NOTE — Telephone Encounter (Signed)
Notified pt to call back with appt date for reconstruction so we can manage her anti-coagulation. Rx sent for refill

## 2013-05-27 NOTE — Telephone Encounter (Signed)
Patients reconstruction date is on 12/10.  Please call her and tell her not to take Xarelto on 12/8 or 12/9.  Thanks.

## 2013-06-08 ENCOUNTER — Telehealth: Payer: Self-pay | Admitting: *Deleted

## 2013-06-08 NOTE — Telephone Encounter (Signed)
Call from pt with concern when to stop Xarelto. Final reconstruction on Dec 10th. Reviewed with NP, notified pt last dose to be taken on DEC &. Do not take on DEC 8 or 9.

## 2013-06-08 NOTE — Telephone Encounter (Signed)
Pt requested cholesterol level be drawn at office visit on 12/22 as she has wellness paperwork to turn in for her work. Discussed with pt I would give message to provider.

## 2013-06-09 NOTE — Telephone Encounter (Signed)
She needs to have her PCP do this for her

## 2013-06-15 NOTE — Telephone Encounter (Signed)
Per MD, Notified pt she needs to have her PCP do this. Pt verbalized understanding. No further concerns

## 2013-06-18 ENCOUNTER — Encounter (HOSPITAL_BASED_OUTPATIENT_CLINIC_OR_DEPARTMENT_OTHER): Payer: Self-pay | Admitting: *Deleted

## 2013-06-18 ENCOUNTER — Other Ambulatory Visit: Payer: Self-pay | Admitting: Obstetrics and Gynecology

## 2013-06-18 NOTE — Progress Notes (Signed)
No more labs needed-finished chemo-feels well-ready to get TE out-

## 2013-06-23 ENCOUNTER — Other Ambulatory Visit: Payer: Self-pay | Admitting: Plastic Surgery

## 2013-06-23 DIAGNOSIS — Z9013 Acquired absence of bilateral breasts and nipples: Secondary | ICD-10-CM

## 2013-06-23 NOTE — H&P (Signed)
 Fayette A Arras  06/02/2013 8:00 AM   Office Visit  MRN:  [id number]  Department:  Plastic Surgery  Dept Phone: [phone]  Description: Female DOB: 09/08/1955  Provider: Claire Sanger, DO    Diagnoses    Acquired absence of bilateral breasts and nipples    -  Primary    V45.71      Reason for Visit -  Breast Reconstruction   Vitals - Last Recorded    112/69  89  1.651 m (5' 5")  66.225 kg (146 lb)  24.3 kg/m2  95%    Subjective:      Patient ID: Nancy Beard is a 57 y.o. female.  HPI The patient is a 57 yrs old wf here for a history and physical for secondary breast reconstruction. She underwent a bilateral mastectomy with immediate reconstruction with expander and ADM. She was seen in the Multidisciplinary breast clinic for a newly diagnosed invasive ductal carcinoma of the right breast-grade 3, triple negative breast cancer. The lesion was located at the 5 o'clock position of the right breast and was detected on a screening mammogram. Her current fill volume is 430/350 cc on the left and 500/350 cc on the right. The right implant looks like it has rotated a little. She is 5 feet 6 inches tall, weighs 146 pounds and wore a 38 C bra.  The following portions of the patient's history were reviewed and updated as appropriate: allergies, current medications, past family history, past medical history, past social history, past surgical history and problem list.  Review of Systems  Constitutional: Negative.   HENT: Negative.   Eyes: Negative.   Respiratory: Negative.   Cardiovascular: Negative.   Gastrointestinal: Negative.   Endocrine: Negative.   Genitourinary: Negative.   Neurological: Negative.   Hematological: Negative.   Psychiatric/Behavioral: Negative.      Objective:   Physical Exam  Constitutional: She appears well-developed and well-nourished.  HENT:   Head: Normocephalic and atraumatic.  Eyes: Conjunctivae and EOM are normal. Pupils are equal, round, and  reactive to light.  Cardiovascular: Normal rate.   Pulmonary/Chest: Effort normal.  Musculoskeletal: Normal range of motion.  Neurological: She is alert.  Skin: Skin is warm.  Psychiatric: She has a normal mood and affect. Her behavior is normal. Judgment and thought content normal.      Assessment:   1.  Acquired absence of bilateral breasts and nipples        Plan:     Plan for removal of expander and placement of silicone implants.  The consent was obtained with risks and complications reviewed which included bleeding, pain, scar, infection and the risk of anesthesia.  The patients questions were answered to the patients expressed satisfaction.  Medications Ordered This Encounter      diazepam (VALIUM) 2 MG tablet Take 1 tablet (2 mg total) by mouth every 6 (six) hours as needed for up to 10 days for Anxiety.    cephalexin (KEFLEX) 500 MG capsule Take 1 capsule (500 mg total) by mouth 4 times daily.     HYDROcodone-acetaminophen (NORCO) 5-325 mg per tablet  Take 1 tablet by mouth every 6 (six) hours as needed for up to 10 days for Pain.    enoxaparin (LOVENOX) 30 mg/0.3 mL Syrg injection  Inject 0.66 mLs (66.2 mg total) into the skin daily for 5 days.   Discontinued Medications    diazepam (VALIUM) 2 MG tablet Completed Therapy    dexamethasone (DECADRON) 0.25 MG tablet Completed   Therapy    valACYclovir (VALTREX) 500 MG tablet Completed Therapy    LORazepam (ATIVAN) 0.5 MG tablet Completed Therapy    loratadine (CLARITIN) 10 mg tablet Completed Therapy    HYDROcodone-acetaminophen (NORCO) 5-325 mg per tablet Completed Therapy    docusate sodium (COLACE) 100 MG capsule Completed Therapy    sulfamethoxazole-trimethoprim (BACTRIM DS) 800-160 mg per tablet Completed Therapy    oxyCODONE (ROXICODONE) 5 MG immediate release tablet Completed Therapy    gentamicin (GARAMYCIN) 0.3 % ophthalmic solution Completed Therapy    ciprofloxacin (CIPRO) 500 MG tablet      anastrozole (ARIMIDEX) 1 mg  tablet        

## 2013-06-24 ENCOUNTER — Encounter (HOSPITAL_BASED_OUTPATIENT_CLINIC_OR_DEPARTMENT_OTHER): Payer: Self-pay | Admitting: *Deleted

## 2013-06-24 ENCOUNTER — Encounter (HOSPITAL_BASED_OUTPATIENT_CLINIC_OR_DEPARTMENT_OTHER): Payer: Private Health Insurance - Indemnity | Admitting: Anesthesiology

## 2013-06-24 ENCOUNTER — Ambulatory Visit (HOSPITAL_COMMUNITY): Payer: Private Health Insurance - Indemnity

## 2013-06-24 ENCOUNTER — Ambulatory Visit (HOSPITAL_BASED_OUTPATIENT_CLINIC_OR_DEPARTMENT_OTHER): Payer: Private Health Insurance - Indemnity | Admitting: Anesthesiology

## 2013-06-24 ENCOUNTER — Ambulatory Visit (HOSPITAL_BASED_OUTPATIENT_CLINIC_OR_DEPARTMENT_OTHER)
Admission: RE | Admit: 2013-06-24 | Discharge: 2013-06-24 | Disposition: A | Discharge status: Home / Self Care | Payer: Private Health Insurance - Indemnity | Source: Ambulatory Visit | Attending: Plastic Surgery | Admitting: Plastic Surgery

## 2013-06-24 ENCOUNTER — Encounter (HOSPITAL_BASED_OUTPATIENT_CLINIC_OR_DEPARTMENT_OTHER)
Admission: RE | Disposition: A | Discharge status: Home / Self Care | Payer: Self-pay | Source: Ambulatory Visit | Attending: Plastic Surgery

## 2013-06-24 DIAGNOSIS — Z853 Personal history of malignant neoplasm of breast: Secondary | ICD-10-CM | POA: Insufficient documentation

## 2013-06-24 DIAGNOSIS — Z86718 Personal history of other venous thrombosis and embolism: Secondary | ICD-10-CM | POA: Insufficient documentation

## 2013-06-24 DIAGNOSIS — E039 Hypothyroidism, unspecified: Secondary | ICD-10-CM | POA: Insufficient documentation

## 2013-06-24 DIAGNOSIS — Z421 Encounter for breast reconstruction following mastectomy: Secondary | ICD-10-CM | POA: Insufficient documentation

## 2013-06-24 DIAGNOSIS — I739 Peripheral vascular disease, unspecified: Secondary | ICD-10-CM | POA: Insufficient documentation

## 2013-06-24 DIAGNOSIS — F3289 Other specified depressive episodes: Secondary | ICD-10-CM | POA: Insufficient documentation

## 2013-06-24 DIAGNOSIS — Z9013 Acquired absence of bilateral breasts and nipples: Secondary | ICD-10-CM

## 2013-06-24 DIAGNOSIS — Z9010 Acquired absence of unspecified breast and nipple: Secondary | ICD-10-CM | POA: Insufficient documentation

## 2013-06-24 DIAGNOSIS — F329 Major depressive disorder, single episode, unspecified: Secondary | ICD-10-CM | POA: Insufficient documentation

## 2013-06-24 DIAGNOSIS — Z79899 Other long term (current) drug therapy: Secondary | ICD-10-CM | POA: Insufficient documentation

## 2013-06-24 HISTORY — PX: REMOVAL OF BILATERAL TISSUE EXPANDERS WITH PLACEMENT OF BILATERAL BREAST IMPLANTS: SHX6431

## 2013-06-24 HISTORY — DX: Presence of spectacles and contact lenses: Z97.3

## 2013-06-24 LAB — POCT HEMOGLOBIN-HEMACUE: Hemoglobin: 13.5 g/dL (ref 12.0–15.0)

## 2013-06-24 SURGERY — REMOVAL, TISSUE EXPANDER, BREAST, BILATERAL, WITH BILATERAL IMPLANT IMPLANT INSERTION
Anesthesia: General | Site: Breast | Laterality: Bilateral

## 2013-06-24 MED ORDER — BUPIVACAINE-EPINEPHRINE PF 0.25-1:200000 % IJ SOLN
INTRAMUSCULAR | Status: AC
  Filled 2013-06-24: qty 30

## 2013-06-24 MED ORDER — ONDANSETRON HCL 4 MG/2ML IJ SOLN
INTRAMUSCULAR | Status: DC | PRN
  Administered 2013-06-24: 4 mg via INTRAVENOUS

## 2013-06-24 MED ORDER — FENTANYL CITRATE 0.05 MG/ML IJ SOLN
INTRAMUSCULAR | Status: AC
  Filled 2013-06-24: qty 8

## 2013-06-24 MED ORDER — FENTANYL CITRATE 0.05 MG/ML IJ SOLN
INTRAMUSCULAR | Status: DC | PRN
  Administered 2013-06-24 (×2): 25 ug via INTRAVENOUS
  Administered 2013-06-24: 100 ug via INTRAVENOUS

## 2013-06-24 MED ORDER — LIDOCAINE HCL (CARDIAC) 20 MG/ML IV SOLN
INTRAVENOUS | Status: DC | PRN
  Administered 2013-06-24: 50 mg via INTRAVENOUS

## 2013-06-24 MED ORDER — CEFAZOLIN SODIUM-DEXTROSE 2-3 GM-% IV SOLR
2.0000 g | INTRAVENOUS | Status: AC
  Administered 2013-06-24: 2 g via INTRAVENOUS

## 2013-06-24 MED ORDER — HYDROMORPHONE HCL PF 1 MG/ML IJ SOLN
INTRAMUSCULAR | Status: AC
  Filled 2013-06-24: qty 1

## 2013-06-24 MED ORDER — LIDOCAINE HCL (PF) 1 % IJ SOLN
INTRAMUSCULAR | Status: AC
  Filled 2013-06-24: qty 60

## 2013-06-24 MED ORDER — ONDANSETRON HCL 4 MG/2ML IJ SOLN: 4.0000 mg | Freq: Four times a day (QID) | INTRAMUSCULAR | Status: DC | PRN

## 2013-06-24 MED ORDER — OXYCODONE HCL 5 MG PO TABS
5.0000 mg | ORAL_TABLET | Freq: Once | ORAL | Status: AC | PRN
Start: 2013-06-24 — End: 2013-06-24
  Administered 2013-06-24: 5 mg via ORAL

## 2013-06-24 MED ORDER — DEXAMETHASONE SODIUM PHOSPHATE 4 MG/ML IJ SOLN
INTRAMUSCULAR | Status: DC | PRN
  Administered 2013-06-24: 10 mg via INTRAVENOUS

## 2013-06-24 MED ORDER — HYDROMORPHONE HCL PF 1 MG/ML IJ SOLN
0.2500 mg | INTRAMUSCULAR | Status: DC | PRN
  Administered 2013-06-24: 0.5 mg via INTRAVENOUS
  Administered 2013-06-24 (×2): 0.25 mg via INTRAVENOUS

## 2013-06-24 MED ORDER — OXYCODONE HCL 5 MG PO TABS
ORAL_TABLET | ORAL | Status: AC
  Filled 2013-06-24: qty 1

## 2013-06-24 MED ORDER — LACTATED RINGERS IV SOLN
INTRAVENOUS | Status: DC
  Administered 2013-06-24 (×2): via INTRAVENOUS
  Administered 2013-06-24: 20 mL/h via INTRAVENOUS

## 2013-06-24 MED ORDER — CEFAZOLIN SODIUM-DEXTROSE 2-3 GM-% IV SOLR
INTRAVENOUS | Status: AC
  Filled 2013-06-24: qty 50

## 2013-06-24 MED ORDER — FENTANYL CITRATE 0.05 MG/ML IJ SOLN: 50.0000 ug | INTRAMUSCULAR | Status: DC | PRN

## 2013-06-24 MED ORDER — MIDAZOLAM HCL 2 MG/2ML IJ SOLN: 1.0000 mg | INTRAMUSCULAR | Status: DC | PRN

## 2013-06-24 MED ORDER — OXYCODONE HCL 5 MG/5ML PO SOLN: 5.0000 mg | Freq: Once | ORAL | Status: AC | PRN

## 2013-06-24 MED ORDER — MIDAZOLAM HCL 2 MG/2ML IJ SOLN
INTRAMUSCULAR | Status: AC
  Filled 2013-06-24: qty 2

## 2013-06-24 MED ORDER — PROPOFOL 10 MG/ML IV BOLUS
INTRAVENOUS | Status: DC | PRN
  Administered 2013-06-24: 200 mg via INTRAVENOUS

## 2013-06-24 MED ORDER — SODIUM CHLORIDE 0.9 % IR SOLN
Status: DC | PRN
  Administered 2013-06-24: 10:00:00

## 2013-06-24 MED ORDER — MIDAZOLAM HCL 5 MG/5ML IJ SOLN
INTRAMUSCULAR | Status: DC | PRN
  Administered 2013-06-24: 2 mg via INTRAVENOUS

## 2013-06-24 MED ORDER — BUPIVACAINE-EPINEPHRINE 0.25% -1:200000 IJ SOLN
INTRAMUSCULAR | Status: DC | PRN
  Administered 2013-06-24: 7 mL

## 2013-06-24 MED ORDER — EPHEDRINE SULFATE 50 MG/ML IJ SOLN
INTRAMUSCULAR | Status: DC | PRN
  Administered 2013-06-24 (×2): 10 mg via INTRAVENOUS

## 2013-06-24 MED ORDER — LIDOCAINE-EPINEPHRINE 1 %-1:100000 IJ SOLN
INTRAMUSCULAR | Status: DC | PRN
  Administered 2013-06-24: 1 mL

## 2013-06-24 SURGICAL SUPPLY — 65 items
ADH SKN CLS APL DERMABOND .7 (GAUZE/BANDAGES/DRESSINGS) ×2
BAG DECANTER FOR FLEXI CONT (MISCELLANEOUS) ×2 IMPLANT
BINDER BREAST LRG (GAUZE/BANDAGES/DRESSINGS) IMPLANT
BINDER BREAST MEDIUM (GAUZE/BANDAGES/DRESSINGS) ×1 IMPLANT
BINDER BREAST XLRG (GAUZE/BANDAGES/DRESSINGS) IMPLANT
BINDER BREAST XXLRG (GAUZE/BANDAGES/DRESSINGS) IMPLANT
BIOPATCH RED 1 DISK 7.0 (GAUZE/BANDAGES/DRESSINGS) IMPLANT
BLADE HEX COATED 2.75 (ELECTRODE) ×2 IMPLANT
BLADE SURG 15 STRL LF DISP TIS (BLADE) ×1 IMPLANT
BLADE SURG 15 STRL SS (BLADE) ×2
BNDG GAUZE ELAST 4 BULKY (GAUZE/BANDAGES/DRESSINGS) ×4 IMPLANT
CANISTER SUCT 1200ML W/VALVE (MISCELLANEOUS) ×2 IMPLANT
CHLORAPREP W/TINT 26ML (MISCELLANEOUS) ×2 IMPLANT
CORDS BIPOLAR (ELECTRODE) IMPLANT
COVER MAYO STAND STRL (DRAPES) ×2 IMPLANT
COVER TABLE BACK 60X90 (DRAPES) ×2 IMPLANT
DECANTER SPIKE VIAL GLASS SM (MISCELLANEOUS) IMPLANT
DERMABOND ADVANCED (GAUZE/BANDAGES/DRESSINGS) ×2
DERMABOND ADVANCED .7 DNX12 (GAUZE/BANDAGES/DRESSINGS) ×1 IMPLANT
DRAIN CHANNEL 19F RND (DRAIN) IMPLANT
DRAPE LAPAROSCOPIC ABDOMINAL (DRAPES) ×2 IMPLANT
DRSG TEGADERM 2-3/8X2-3/4 SM (GAUZE/BANDAGES/DRESSINGS) IMPLANT
ELECT BLADE 4.0 EZ CLEAN MEGAD (MISCELLANEOUS) ×2
ELECT REM PT RETURN 9FT ADLT (ELECTROSURGICAL) ×2
ELECTRODE BLDE 4.0 EZ CLN MEGD (MISCELLANEOUS) ×1 IMPLANT
ELECTRODE REM PT RTRN 9FT ADLT (ELECTROSURGICAL) ×1 IMPLANT
EVACUATOR SILICONE 100CC (DRAIN) IMPLANT
GAUZE SPONGE 4X4 12PLY STRL LF (GAUZE/BANDAGES/DRESSINGS) IMPLANT
GLOVE BIO SURGEON STRL SZ 6.5 (GLOVE) ×4 IMPLANT
GOWN PREVENTION PLUS XLARGE (GOWN DISPOSABLE) ×4 IMPLANT
IMPL BREAST GEL HP 400CC (Breast) IMPLANT
IMPLANT BREAST GEL HP 400CC (Breast) ×4 IMPLANT
IV NS 1000ML (IV SOLUTION)
IV NS 1000ML BAXH (IV SOLUTION) IMPLANT
IV NS 500ML (IV SOLUTION)
IV NS 500ML BAXH (IV SOLUTION) ×1 IMPLANT
KIT FILL SYSTEM UNIVERSAL (SET/KITS/TRAYS/PACK) IMPLANT
NDL HYPO 25X1 1.5 SAFETY (NEEDLE) IMPLANT
NDL SAFETY ECLIPSE 18X1.5 (NEEDLE) ×1 IMPLANT
NEEDLE HYPO 18GX1.5 SHARP (NEEDLE) ×2
NEEDLE HYPO 25X1 1.5 SAFETY (NEEDLE) ×2 IMPLANT
PACK BASIN DAY SURGERY FS (CUSTOM PROCEDURE TRAY) ×2 IMPLANT
PAD ABD 8X10 STRL (GAUZE/BANDAGES/DRESSINGS) ×4 IMPLANT
PENCIL BUTTON HOLSTER BLD 10FT (ELECTRODE) ×2 IMPLANT
PIN SAFETY STERILE (MISCELLANEOUS) IMPLANT
SIZER BREAST REUSE 400CC (SIZER) ×2
SIZER BRST REUSE 400CC (SIZER) IMPLANT
SIZER GENERIC MENTOR (SIZER) ×1 IMPLANT
SLEEVE SCD COMPRESS KNEE MED (MISCELLANEOUS) ×2 IMPLANT
SPONGE LAP 18X18 X RAY DECT (DISPOSABLE) ×4 IMPLANT
SUT MNCRL AB 4-0 PS2 18 (SUTURE) ×2 IMPLANT
SUT MON AB 5-0 PS2 18 (SUTURE) ×3 IMPLANT
SUT PDS 3-0 CT2 (SUTURE) ×4
SUT PDS AB 2-0 CT2 27 (SUTURE) IMPLANT
SUT PDS II 3-0 CT2 27 ABS (SUTURE) IMPLANT
SUT VIC AB 3-0 SH 27 (SUTURE) ×12
SUT VIC AB 3-0 SH 27X BRD (SUTURE) ×1 IMPLANT
SUT VICRYL 4-0 PS2 18IN ABS (SUTURE) ×1 IMPLANT
SYR 50ML LL SCALE MARK (SYRINGE) IMPLANT
SYR BULB IRRIGATION 50ML (SYRINGE) ×2 IMPLANT
SYR CONTROL 10ML LL (SYRINGE) ×1 IMPLANT
TOWEL OR 17X24 6PK STRL BLUE (TOWEL DISPOSABLE) ×4 IMPLANT
TUBE CONNECTING 20X1/4 (TUBING) ×2 IMPLANT
UNDERPAD 30X30 INCONTINENT (UNDERPADS AND DIAPERS) ×4 IMPLANT
YANKAUER SUCT BULB TIP NO VENT (SUCTIONS) ×2 IMPLANT

## 2013-06-24 NOTE — Anesthesia Postprocedure Evaluation (Signed)
Anesthesia Post Note  Patient: Nancy Beard  Procedure(s) Performed: Procedure(s) (LRB): REMOVAL OF BILATERAL TISSUE EXPANDERS WITH PLACEMENT OF BILATERAL BREAST IMPLANTS (Bilateral)  Anesthesia type: General  Patient location: PACU  Post pain: Pain level controlled and Adequate analgesia  Post assessment: Post-op Vital signs reviewed, Patient's Cardiovascular Status Stable, Respiratory Function Stable, Patent Airway and Pain level controlled  Last Vitals:  Filed Vitals:   06/24/13 1245  BP:   Pulse: 107  Temp:   Resp: 16    Post vital signs: Reviewed and stable  Level of consciousness: awake, alert  and oriented  Complications: No apparent anesthesia complications

## 2013-06-24 NOTE — Anesthesia Preprocedure Evaluation (Signed)
Anesthesia Evaluation  Patient identified by MRN, date of birth, ID band Patient awake    Reviewed: Allergy & Precautions, H&P , NPO status , Patient's Chart, lab work & pertinent test results  Airway Mallampati: II  Neck ROM: full    Dental   Pulmonary neg pulmonary ROS,          Cardiovascular + Peripheral Vascular Disease and DVT     Neuro/Psych Depression    GI/Hepatic   Endo/Other  Hypothyroidism   Renal/GU      Musculoskeletal   Abdominal   Peds  Hematology   Anesthesia Other Findings   Reproductive/Obstetrics                           Anesthesia Physical Anesthesia Plan  ASA: II  Anesthesia Plan: General   Post-op Pain Management:    Induction: Intravenous  Airway Management Planned: LMA  Additional Equipment:   Intra-op Plan:   Post-operative Plan:   Informed Consent: I have reviewed the patients History and Physical, chart, labs and discussed the procedure including the risks, benefits and alternatives for the proposed anesthesia with the patient or authorized representative who has indicated his/her understanding and acceptance.     Plan Discussed with: CRNA, Anesthesiologist and Surgeon  Anesthesia Plan Comments:         Anesthesia Quick Evaluation

## 2013-06-24 NOTE — Op Note (Signed)
Op report Bilateral Exchange   DATE OF OPERATION: 06/24/2013  LOCATION: [facility]  SURGICAL DIVISION: Plastic Surgery  PREOPERATIVE DIAGNOSES:  1. History of breast cancer.  2. Acquired absence of bilateral breast.   POSTOPERATIVE DIAGNOSES:  1. History of breast cancer.  2. Acquired absence of bilateral breast.   PROCEDURE:  1. Bilateral exchange of tissue expanders for implants.  2. Bilateral capsulotomies for implant respositioning.  SURGEON: [employer], DO  ASSISTANT:   ANESTHESIA:  General.   COMPLICATIONS: None.   IMPLANTS: Left - Mentor Smooth Round Ultra High Profile Gel 400cc. Ref #[id number].   Right - Mentor Smooth Round Ultra High Profile Gel 400cc. Ref #[id number].    INDICATIONS FOR PROCEDURE:  The patient, Nancy Beard, is a 56 y.o. female born on [birth year], is here for treatment after bilateral mastectomies.  She had tissue expanders placed at the time of mastectomies. She now presents for exchange of her expanders for implants.  She requires capsulotomies to better position the implants.   CONSENT:  Informed consent was obtained directly from the patient. Risks, benefits and alternatives were fully discussed. Specific risks including but not limited to bleeding, infection, hematoma, seroma, scarring, pain, implant infection, implant extrusion, capsular contracture, asymmetry, wound healing problems, and need for further surgery were all discussed. The patient did have an ample opportunity to have her questions answered to her satisfaction.   DESCRIPTION OF PROCEDURE:  The patient was taken to the operating room. SCDs were placed and IV antibiotics were given. The patient's chest was prepped and draped in a sterile fashion. A time out was performed and the implants to be used were identified.  One percent Lidocaine with epinephrine was used to infiltrate the area.   The old mastectomy scar was opened and superior mastectomy and inferior  mastectomy flaps were re-raised over the pectoralis major muscle. The pectoralis was split to expose the tissue expander which was removed. Inspection of the pocket showed a normal healthy capsule and good integration of the biologic matrix.   Circumferential capsulotomies were performed on each breast to allow for breast pocket expansion on either side.  Measurements were made on either side to confirm adequate pocket size for the implant dimensions.  Hemostasis was ensured.  Lateral capsulectomies were done to help reposition the lateral capsule more medially.  The capsule was attached with 3-0 PDS to reposition the edge.  Gloves were changed. The implants were placed in the pockets and oriented appropriately after the sizers were used to confirm the correct size. The pectoralis major muscle and capsule on the anterior surface were re-closed with a 3-0 running Vicryl suture. The remaining skin was closed with 4-0 Monocryl deep dermal and 5-0 Monocryl subcuticular stitches.  A breast binder and ABDs were placed.  The patient was awakened from anesthesia and taken to the recovery room in satisfactory condition.

## 2013-06-24 NOTE — H&P (View-Only) (Signed)
Nancy Beard  06/02/2013 8:00 AM   Office Visit  MRN:  [id number]  Department:  Plastic Surgery  Dept Phone: [phone]  Description: Female DOB: August 04, 1955  Provider: Wayland Denis, DO    Diagnoses    Acquired absence of bilateral breasts and nipples    -  Primary    V45.71      Reason for Visit -  Breast Reconstruction   Vitals - Last Recorded    112/69  89  1.651 m (5\' 5" )  66.225 kg (146 lb)  24.3 kg/m2  95%    Subjective:      Patient ID: Nancy Beard is a 57 y.o. female.  HPI The patient is a 57 yrs old wf here for a history and physical for secondary breast reconstruction. She underwent a bilateral mastectomy with immediate reconstruction with expander and ADM. She was seen in the Multidisciplinary breast clinic for a newly diagnosed invasive ductal carcinoma of the right breast-grade 3, triple negative breast cancer. The lesion was located at the 5 o'clock position of the right breast and was detected on a screening mammogram. Her current fill volume is 430/350 cc on the left and 500/350 cc on the right. The right implant looks like it has rotated a little. She is 5 feet 6 inches tall, weighs 146 pounds and wore a 38 C bra.  The following portions of the patient's history were reviewed and updated as appropriate: allergies, current medications, past family history, past medical history, past social history, past surgical history and problem list.  Review of Systems  Constitutional: Negative.   HENT: Negative.   Eyes: Negative.   Respiratory: Negative.   Cardiovascular: Negative.   Gastrointestinal: Negative.   Endocrine: Negative.   Genitourinary: Negative.   Neurological: Negative.   Hematological: Negative.   Psychiatric/Behavioral: Negative.      Objective:   Physical Exam  Constitutional: She appears well-developed and well-nourished.  HENT:   Head: Normocephalic and atraumatic.  Eyes: Conjunctivae and EOM are normal. Pupils are equal, round, and  reactive to light.  Cardiovascular: Normal rate.   Pulmonary/Chest: Effort normal.  Musculoskeletal: Normal range of motion.  Neurological: She is alert.  Skin: Skin is warm.  Psychiatric: She has a normal mood and affect. Her behavior is normal. Judgment and thought content normal.      Assessment:   1.  Acquired absence of bilateral breasts and nipples        Plan:     Plan for removal of expander and placement of silicone implants.  The consent was obtained with risks and complications reviewed which included bleeding, pain, scar, infection and the risk of anesthesia.  The patients questions were answered to the patients expressed satisfaction.  Medications Ordered This Encounter      diazepam (VALIUM) 2 MG tablet Take 1 tablet (2 mg total) by mouth every 6 (six) hours as needed for up to 10 days for Anxiety.    cephalexin (KEFLEX) 500 MG capsule Take 1 capsule (500 mg total) by mouth 4 times daily.     HYDROcodone-acetaminophen (NORCO) 5-325 mg per tablet  Take 1 tablet by mouth every 6 (six) hours as needed for up to 10 days for Pain.    enoxaparin (LOVENOX) 30 mg/0.3 mL Syrg injection  Inject 0.66 mLs (66.2 mg total) into the skin daily for 5 days.   Discontinued Medications    diazepam (VALIUM) 2 MG tablet Completed Therapy    dexamethasone (DECADRON) 0.25 MG tablet Completed  Therapy    valACYclovir (VALTREX) 500 MG tablet Completed Therapy    LORazepam (ATIVAN) 0.5 MG tablet Completed Therapy    loratadine (CLARITIN) 10 mg tablet Completed Therapy    HYDROcodone-acetaminophen (NORCO) 5-325 mg per tablet Completed Therapy    docusate sodium (COLACE) 100 MG capsule Completed Therapy    sulfamethoxazole-trimethoprim (BACTRIM DS) 800-160 mg per tablet Completed Therapy    oxyCODONE (ROXICODONE) 5 MG immediate release tablet Completed Therapy    gentamicin (GARAMYCIN) 0.3 % ophthalmic solution Completed Therapy    ciprofloxacin (CIPRO) 500 MG tablet      anastrozole (ARIMIDEX) 1 mg  tablet

## 2013-06-24 NOTE — Anesthesia Procedure Notes (Signed)
Procedure Name: LMA Insertion Date/Time: 06/24/2013 9:55 AM Performed by: Caren Macadam Pre-anesthesia Checklist: Patient identified, Emergency Drugs available, Suction available and Patient being monitored Patient Re-evaluated:Patient Re-evaluated prior to inductionOxygen Delivery Method: Circle System Utilized Preoxygenation: Pre-oxygenation with 100% oxygen Intubation Type: IV induction Ventilation: Mask ventilation without difficulty LMA: LMA inserted LMA Size: 4.0 Number of attempts: 1 Airway Equipment and Method: bite block Placement Confirmation: positive ETCO2 and breath sounds checked- equal and bilateral Tube secured with: Tape Dental Injury: Teeth and Oropharynx as per pre-operative assessment

## 2013-06-24 NOTE — Brief Op Note (Signed)
06/24/2013  11:48 AM  PATIENT:  Barbie Haggis  57 y.o. female  PRE-OPERATIVE DIAGNOSIS:  HISTORY OF BREAST CANCER  POST-OPERATIVE DIAGNOSIS:  HISTORY OF BREAST CANCER  PROCEDURE:  Procedure(s): REMOVAL OF BILATERAL TISSUE EXPANDERS WITH PLACEMENT OF BILATERAL BREAST IMPLANTS (Bilateral)  SURGEON:  Surgeon(s) and Role:    * Kathaleen Dudziak Sanger, DO - Primary  PHYSICIAN ASSISTANT: none  ASSISTANTS: none   ANESTHESIA:   general  EBL:  Total I/O In: 2550 [I.V.:2550] Out: -   BLOOD ADMINISTERED:none  DRAINS: none   LOCAL MEDICATIONS USED:  LIDOCAINE   SPECIMEN:  Source of Specimen:  right mastectomy scar and capsule.  DISPOSITION OF SPECIMEN:  PATHOLOGY  COUNTS:  YES  TOURNIQUET:  * No tourniquets in log *  DICTATION: .Dragon Dictation  PLAN OF CARE: Discharge to home after PACU  PATIENT DISPOSITION:  PACU - hemodynamically stable.   Delay start of Pharmacological VTE agent (>24hrs) due to surgical blood loss or risk of bleeding: yes

## 2013-06-24 NOTE — Interval H&P Note (Signed)
History and Physical Interval Note:  06/24/2013 9:32 AM  Nancy Beard  has presented today for surgery, with the diagnosis of HISTORY OF BREAST CANCER  The various methods of treatment have been discussed with the patient and family. After consideration of risks, benefits and other options for treatment, the patient has consented to  Procedure(s): REMOVAL OF BILATERAL TISSUE EXPANDERS WITH PLACEMENT OF BILATERAL BREAST IMPLANTS (Bilateral) as a surgical intervention .  The patient's history has been reviewed, patient examined, no change in status, stable for surgery.  I have reviewed the patient's chart and labs.  Questions were answered to the patient's satisfaction.     SANGER,CLAIRE

## 2013-06-24 NOTE — Transfer of Care (Signed)
Immediate Anesthesia Transfer of Care Note  Patient: Nancy Beard  Procedure(s) Performed: Procedure(s): REMOVAL OF BILATERAL TISSUE EXPANDERS WITH PLACEMENT OF BILATERAL BREAST IMPLANTS (Bilateral)  Patient Location: PACU  Anesthesia Type:General  Level of Consciousness: awake and patient cooperative  Airway & Oxygen Therapy: Patient Spontanous Breathing and Patient connected to face mask oxygen  Post-op Assessment: Report given to PACU RN and Post -op Vital signs reviewed and stable  Post vital signs: Reviewed and stable  Complications: No apparent anesthesia complications

## 2013-06-25 ENCOUNTER — Encounter (HOSPITAL_BASED_OUTPATIENT_CLINIC_OR_DEPARTMENT_OTHER): Payer: Self-pay | Admitting: Plastic Surgery

## 2013-06-29 ENCOUNTER — Other Ambulatory Visit: Payer: Private Health Insurance - Indemnity | Admitting: Lab

## 2013-06-29 ENCOUNTER — Ambulatory Visit: Payer: Private Health Insurance - Indemnity | Admitting: Adult Health

## 2013-06-30 ENCOUNTER — Encounter: Payer: Self-pay | Admitting: Gynecologic Oncology

## 2013-06-30 ENCOUNTER — Ambulatory Visit: Payer: Private Health Insurance - Indemnity | Attending: Gynecologic Oncology | Admitting: Gynecologic Oncology

## 2013-06-30 ENCOUNTER — Other Ambulatory Visit: Payer: Self-pay | Admitting: Adult Health

## 2013-06-30 VITALS — BP 123/82 | HR 98 | Temp 98.8°F | Resp 16 | Ht 65.32 in | Wt 145.5 lb

## 2013-06-30 DIAGNOSIS — E039 Hypothyroidism, unspecified: Secondary | ICD-10-CM | POA: Insufficient documentation

## 2013-06-30 DIAGNOSIS — Z171 Estrogen receptor negative status [ER-]: Secondary | ICD-10-CM | POA: Insufficient documentation

## 2013-06-30 DIAGNOSIS — Z9889 Other specified postprocedural states: Secondary | ICD-10-CM | POA: Insufficient documentation

## 2013-06-30 DIAGNOSIS — Z803 Family history of malignant neoplasm of breast: Secondary | ICD-10-CM | POA: Insufficient documentation

## 2013-06-30 DIAGNOSIS — E785 Hyperlipidemia, unspecified: Secondary | ICD-10-CM | POA: Insufficient documentation

## 2013-06-30 DIAGNOSIS — I82409 Acute embolism and thrombosis of unspecified deep veins of unspecified lower extremity: Secondary | ICD-10-CM

## 2013-06-30 DIAGNOSIS — C50919 Malignant neoplasm of unspecified site of unspecified female breast: Secondary | ICD-10-CM | POA: Insufficient documentation

## 2013-06-30 DIAGNOSIS — Z7901 Long term (current) use of anticoagulants: Secondary | ICD-10-CM | POA: Insufficient documentation

## 2013-06-30 DIAGNOSIS — Z9221 Personal history of antineoplastic chemotherapy: Secondary | ICD-10-CM | POA: Insufficient documentation

## 2013-06-30 DIAGNOSIS — N9089 Other specified noninflammatory disorders of vulva and perineum: Secondary | ICD-10-CM

## 2013-06-30 DIAGNOSIS — Z9010 Acquired absence of unspecified breast and nipple: Secondary | ICD-10-CM | POA: Insufficient documentation

## 2013-06-30 DIAGNOSIS — K5730 Diverticulosis of large intestine without perforation or abscess without bleeding: Secondary | ICD-10-CM | POA: Insufficient documentation

## 2013-06-30 DIAGNOSIS — Z79899 Other long term (current) drug therapy: Secondary | ICD-10-CM | POA: Insufficient documentation

## 2013-06-30 DIAGNOSIS — D071 Carcinoma in situ of vulva: Secondary | ICD-10-CM | POA: Insufficient documentation

## 2013-06-30 DIAGNOSIS — I82401 Acute embolism and thrombosis of unspecified deep veins of right lower extremity: Secondary | ICD-10-CM

## 2013-06-30 MED ORDER — ENOXAPARIN SODIUM 100 MG/ML ~~LOC~~ SOLN: 100.0000 mg | SUBCUTANEOUS | Status: DC

## 2013-06-30 NOTE — Progress Notes (Signed)
Consult Note: Gyn-Onc  Consult was requested by Dr. Anderson for the evaluation of Nancy Beard 57 y.o. female  CC:  Chief Complaint  Patient presents with  . Vulvar lesion    New Consult    Assessment/Plan:  Nancy Beard  is a 57 y.o.  with VIN 3 confirmed via biopsy that was collected on 06/19/2013. The plan is for a wide local excision to be performed by Dr. Daniel Clarke Pearson on 07/14/2013. Lovenox will be discontinued 1 day prior to the procedure. Risks of the procedure were discussed with the patient inclusive of infection bleeding.  Issues of the patient and her husband were answered to their satisfaction.  Patient has questions regarding hysterectomy and bilateral salpingo-oophorectomy to eliminate the risk of ovarian or endometrial cancer. The cancer was triple negative she is not on tamoxifen her germline mutation testing is negative and the review of the family history is notable for overwhelming predominance of breast cancer.   HPI:Nancy Beard  is a 57 y.o.  gravida 2 para 1 who on routine pelvic examination was noted to have a lesion on the perineum. It was biopsied and the results were consistent with a high-grade squamous intraepithelial lesion VIN 3 involving the margins. The patient denies any history of abnormal Pap test. Last Pap test in December of 2014 was within normal limits. Denies pruritus or bleeding from the area.  Her history is notable for triple negative right-sided breast cancer diagnosed in December of 2013. She was treated with bilateral mastectomy in February of 2014 and received adjuvant chemotherapy that was completed in August of 2014. Reconstructive surgery was completed this month. Of note the tumor is estrogen receptor negative (2%+) and she has an on and aromatase inhibitor. Genetic testing has been negative for germline mutation. She's currently on Lovenox for a right lower  extremity DVT  Current Meds:  Outpatient Encounter  Prescriptions as of 06/30/2013  Medication Sig  . alendronate (FOSAMAX) 70 MG tablet Take 1 tablet (70 mg total) by mouth once a week. Take with a full glass of water on an empty stomach.  . anastrozole (ARIMIDEX) 1 MG tablet Take 1 tablet (1 mg total) by mouth daily.  . B Complex-C (SUPER B COMPLEX PO) Take 1 tablet by mouth daily.  . gabapentin (NEURONTIN) 100 MG capsule Take 200 mg by mouth 2 (two) times daily.  . gentamicin (GARAMYCIN) 0.3 % ophthalmic solution   . HYDROcodone-acetaminophen (NORCO/VICODIN) 5-325 MG per tablet   . levothyroxine (SYNTHROID, LEVOTHROID) 25 MCG tablet Take 25 mcg by mouth daily.  . lidocaine-prilocaine (EMLA) cream   . liothyronine (CYTOMEL) 25 MCG tablet Take 12.5 mcg by mouth daily.  . Multiple Vitamin (MULTIVITAMIN) tablet Take 1 tablet by mouth daily.  . venlafaxine XR (EFFEXOR-XR) 75 MG 24 hr capsule Take 75 mg by mouth daily with breakfast.  . prochlorperazine (COMPAZINE) 10 MG tablet Take 1 tablet (10 mg total) by mouth every 6 (six) hours as needed.  . Rivaroxaban (XARELTO) 20 MG TABS tablet Take 1 tablet (20 mg total) by mouth daily.    Allergy:  Allergies  Allergen Reactions  . Ciprofloxacin Itching and Other (See Comments)    Patient states she had muscle spasms  . Gluten Meal     Social Hx:   History   Social History  . Marital Status: Married    Spouse Name: N/A    Number of Children: N/A  . Years of Education: N/A   Occupational   History  . Not on file.   Social History Main Topics  . Smoking status: Never Smoker   . Smokeless tobacco: Never Used  . Alcohol Use: No  . Drug Use: No  . Sexual Activity: Yes    Birth Control/ Protection: Post-menopausal   Other Topics Concern  . Not on file   Social History Narrative  . No narrative on file  Spouse present for the visit.  Appears supportive.  Past Surgical Hx:  Past Surgical History  Procedure Laterality Date  . Esophagogastroduodenoscopy  2007    sprue  . Colonoscopy   2006  . Cryoblation of cervix    . Breast lumpectomy    . Total mastectomy Bilateral 09/11/2012    Procedure: bilateral MASTECTOMY;  Surgeon: Todd J Rosenbower, MD;  Location: [facility] OR;  Service: General;  Laterality: Bilateral;  . Axillary sentinel node biopsy Right 09/11/2012    Procedure: AXILLARY SENTINEL lymph NODE  BIOPSY;  Surgeon: Todd J Rosenbower, MD;  Location: [facility] OR;  Service: General;  Laterality: Right;  right nuclear medicine injection 12:30   . Breast reconstruction with placement of tissue expander and flex hd (acellular hydrated dermis) Bilateral 09/11/2012    Procedure: BREAST RECONSTRUCTION WITH PLACEMENT OF TISSUE EXPANDER AND FLEX HD (ACELLULAR HYDRATED DERMIS) ADM;  Surgeon: Claire Sanger, DO;  Location: [facility] OR;  Service: Plastics;  Laterality: Bilateral;  . Incision and drainage of wound Left 09/16/2012    Procedure: Left Breast Evacuation of Hematoma;  Surgeon: Claire Sanger, DO;  Location: [facility] OR;  Service: Plastics;  Laterality: Left;  . Portacath placement Right 10/09/2012    Procedure: US GUIDED INSERTION PORT-A-CATH;  Surgeon: Todd J Rosenbower, MD;  Location: [facility];  Service: General;  Laterality: Right;  Right Subclavian Vein  . Removal of bilateral tissue expanders with placement of bilateral breast implants Bilateral 06/24/2013    Procedure: REMOVAL OF BILATERAL TISSUE EXPANDERS WITH PLACEMENT OF BILATERAL BREAST IMPLANTS;  Surgeon: Claire Sanger, DO;  Location: [facility];  Service: Plastics;  Laterality: Bilateral;    Past Medical Hx:  Past Medical History  Diagnosis Date  . Thyroid disease   . Depression   . Sprue   . Diverticulosis   . Hyperlipidemia   . Breast cancer     right  . Hypothyroidism   . Pneumonia   . DVT (deep venous thrombosis)     Right lower extremity  . Wears glasses     Past Gynecological History: G2P1 Menarche 15, regular menses until menopause in the late 40's  No h/o abnormal pap ./. Pap 06/2013 wnl  Patient's last menstrual period was 09/11/2012.  Family Hx:  Family History  Problem Relation Age of Onset  . Breast cancer Paternal Aunt     dx in her 60s  . Cancer Paternal Aunt     breast  . Lymphoma Paternal Uncle   . Breast cancer Paternal Grandmother     dx >50  . Cancer Paternal Grandmother     breast  . Heart attack Paternal Grandfather   . Breast cancer Other     2 maternal great aunts with breast cancer >50  . Cancer Other     breast    Review of Systems:  Constitutional  Feels well, Cardiovascular  Chest soreness at the site of the surgical procedure.No  pain, shortness of breath, or edema  Pulmonary  No cough or wheeze.  Gastro Intestinal  No nausea, vomitting, or diarrhoea. No bright red blood   per rectum, no abdominal pain, change in bowel movement, or constipation.  Genito Urinary  No frequency, urgency, dysuria,soreness at the perineum Musculo Skeletal  No myalgia, arthralgia, joint swelling or pain  Neurologic  No weakness, numbness, change in gait,  Psychology  No depression, anxiety, insomnia.   Vitals:  Blood pressure 123/82, pulse 98, temperature 98.8 F (37.1 C), temperature source Oral, resp. rate 16, height 5' 5.32" (1.659 m), weight 145 lb 8 oz (65.998 kg), last menstrual period 09/11/2012.  Physical Exam: WD in NAD Neck  Supple NROM, without any enlargements.  Lymph Node Survey No cervical supraclavicular or inguinal adenopathy Cardiovascular  Pulse normal rate, regularity and rhythm.  Lungs  Clear to auscultation bilateraly, without wheezes/crackles/rhonchi. Good air movement.  Psychiatry  Alert and oriented to person, place, and time appropriate speech affect and judgement Abdomen  Normoactive bowel sounds, abdomen soft, non-tender. Back No CVA tenderness Genito Urinary  Vulva/vagina: Normal external female genitalia.  1cm lesion with central granulation tissue and surrounding dysplastic tissue. Extremities  No bilateral  cyanosis, clubbing or edema.   Marcell Pfeifer, MD, PhD 06/30/2013, 4:09 PM    

## 2013-06-30 NOTE — Patient Instructions (Signed)
Plan for surgery the morning on Dec. 30 with Dr. De Blanch.  You will receive a phone call from pre-surgical testing to arrange for a pre-op appointment before surgery.  Please call for any questions or concerns.

## 2013-07-03 ENCOUNTER — Encounter (HOSPITAL_COMMUNITY): Payer: Self-pay | Admitting: Pharmacy Technician

## 2013-07-06 ENCOUNTER — Ambulatory Visit (HOSPITAL_BASED_OUTPATIENT_CLINIC_OR_DEPARTMENT_OTHER): Payer: Private Health Insurance - Indemnity | Admitting: Adult Health

## 2013-07-06 ENCOUNTER — Other Ambulatory Visit (HOSPITAL_BASED_OUTPATIENT_CLINIC_OR_DEPARTMENT_OTHER): Payer: Private Health Insurance - Indemnity

## 2013-07-06 ENCOUNTER — Encounter: Payer: Self-pay | Admitting: Adult Health

## 2013-07-06 ENCOUNTER — Ambulatory Visit (HOSPITAL_BASED_OUTPATIENT_CLINIC_OR_DEPARTMENT_OTHER): Payer: Private Health Insurance - Indemnity

## 2013-07-06 VITALS — BP 129/85 | HR 89 | Temp 97.9°F | Resp 20 | Ht 65.82 in | Wt 146.6 lb

## 2013-07-06 DIAGNOSIS — M810 Age-related osteoporosis without current pathological fracture: Secondary | ICD-10-CM

## 2013-07-06 DIAGNOSIS — C50311 Malignant neoplasm of lower-inner quadrant of right female breast: Secondary | ICD-10-CM

## 2013-07-06 DIAGNOSIS — G609 Hereditary and idiopathic neuropathy, unspecified: Secondary | ICD-10-CM

## 2013-07-06 DIAGNOSIS — C50319 Malignant neoplasm of lower-inner quadrant of unspecified female breast: Secondary | ICD-10-CM

## 2013-07-06 DIAGNOSIS — Z171 Estrogen receptor negative status [ER-]: Secondary | ICD-10-CM

## 2013-07-06 DIAGNOSIS — Z86718 Personal history of other venous thrombosis and embolism: Secondary | ICD-10-CM

## 2013-07-06 DIAGNOSIS — F411 Generalized anxiety disorder: Secondary | ICD-10-CM

## 2013-07-06 LAB — CBC WITH DIFFERENTIAL/PLATELET
BASO%: 0.4 % (ref 0.0–2.0)
Basophils Absolute: 0 10*3/uL (ref 0.0–0.1)
EOS%: 2.8 % (ref 0.0–7.0)
Eosinophils Absolute: 0.1 10*3/uL (ref 0.0–0.5)
HCT: 38.1 % (ref 34.8–46.6)
HGB: 12.5 g/dL (ref 11.6–15.9)
LYMPH%: 33.1 % (ref 14.0–49.7)
MCH: 28.8 pg (ref 25.1–34.0)
MCHC: 32.8 g/dL (ref 31.5–36.0)
MCV: 87.8 fL (ref 79.5–101.0)
MONO#: 0.7 10*3/uL (ref 0.1–0.9)
MONO%: 14.1 % — ABNORMAL HIGH (ref 0.0–14.0)
NEUT#: 2.4 10*3/uL (ref 1.5–6.5)
NEUT%: 49.6 % (ref 38.4–76.8)
Platelets: 259 10*3/uL (ref 145–400)
RBC: 4.33 10*6/uL (ref 3.70–5.45)
RDW: 13.9 % (ref 11.2–14.5)
WBC: 4.8 10*3/uL (ref 3.9–10.3)
lymph#: 1.6 10*3/uL (ref 0.9–3.3)

## 2013-07-06 LAB — COMPREHENSIVE METABOLIC PANEL (CC13)
ALT: 109 U/L — ABNORMAL HIGH (ref 0–55)
AST: 104 U/L — ABNORMAL HIGH (ref 5–34)
Albumin: 3.5 g/dL (ref 3.5–5.0)
Alkaline Phosphatase: 255 U/L — ABNORMAL HIGH (ref 40–150)
Anion Gap: 8 mEq/L (ref 3–11)
BUN: 12.5 mg/dL (ref 7.0–26.0)
CO2: 25 mEq/L (ref 22–29)
Calcium: 9 mg/dL (ref 8.4–10.4)
Chloride: 107 mEq/L (ref 98–109)
Creatinine: 0.6 mg/dL (ref 0.6–1.1)
Glucose: 102 mg/dl (ref 70–140)
Potassium: 4 mEq/L (ref 3.5–5.1)
Sodium: 140 mEq/L (ref 136–145)
Total Bilirubin: 0.26 mg/dL (ref 0.20–1.20)
Total Protein: 7.1 g/dL (ref 6.4–8.3)

## 2013-07-06 MED ORDER — SODIUM CHLORIDE 0.9 % IJ SOLN
10.0000 mL | INTRAMUSCULAR | Status: DC | PRN
  Administered 2013-07-06: 10 mL via INTRAVENOUS
  Filled 2013-07-06: qty 10

## 2013-07-06 MED ORDER — HEPARIN SOD (PORK) LOCK FLUSH 100 UNIT/ML IV SOLN
500.0000 [IU] | Freq: Once | INTRAVENOUS | Status: AC
  Administered 2013-07-06: 500 [IU] via INTRAVENOUS
  Filled 2013-07-06: qty 5

## 2013-07-06 NOTE — Patient Instructions (Signed)
Today you had your port flushed. Continue to have port flushed every 6-8 weeks 

## 2013-07-06 NOTE — Progress Notes (Signed)
OFFICE PROGRESS NOTE  CC  [name], MD [address] [place] [address] Dr. Lurline Hare  Dr. Avel Peace  DIAGNOSIS: 57 year old female with new diagnosis of stage II a breast cancer that is triple negative    STAGE:  Cancer of lower-inner quadrant of female breast  Primary site: Breast (Right)  Staging method: AJCC 7th Edition  Clinical: Stage IIA (T2, N0, cM0)  Summary: Stage IIA (T2, N0, cM0)  PRIOR THERAPY: #1Patient's last mammogram was about 5 years ago. Recently she underwent a screening mammogram that showed a spiculated mass in the lower inner quadrant of the right breast. She had ultrasound performed that showed irregular mass at the 5:00 position 3 cm from the nipple measuring 1.1 cm. She had a needle core biopsy performed on 06/19/2012 the biopsy showed invasive ductal carcinoma with ductal carcinoma in situ grade 3 tumor was ER negative PR negative HER-2/neu negative. Ki-67 was elevated at 34%. She went on to have MRI of the breasts performed on 06/24/2012 the MRI showed in the middle third of the lower inner quadrant of the right breast a 1.5 x 1.3 x 1.9 cm irregular enhancing mass. No abnormal enhancement was seen in the left breast no enlarged axillary or internal mammary adenopathy was detected.   #2 patient is now status post bilateral mastectomies. On her right mastectomy she was found to have a 2.3 cm invasive ductal carcinoma 0/1 lymph nodes were positive for metastatic disease and this is T2 N0) stage II. Tumor was grade 3 ER 2% PR negative HER-2/neu negative with a Ki-67 at 34%. Left mastectomy only showed intraductal papilloma node atypia or malignancy was found.  #3 patient underwent adjuvant chemotherapy since her tumor is essentially triple negative and is a stage II. We discussed the rationale for adjuvant chemotherapy. She received Adriamycin Cytoxan every 2 weeks for a total of 4 cycles with G-CSF support. After this she completed  Taxol and carboplatinum every week for a total of 12 weeks. I did discuss with her use of antiestrogen therapy such as an aromatase inhibitor since her tumor is 2% ER positive. I discussed the rationale for this.  #4 Patient was started on Arimidex on 05/19/13, she does have osteoporosis on her bone density from 04/08/13 and is taking Fosamax every week.    CURRENT THERAPY: daily Arimidex  INTERVAL HISTORY: SANTIA LABATE 57 y.o. female returns for follow up today.  She is doing well today.  She had a pap smear that demonstrated a lesion on the perineum, it was biopsied and the results were consistent with a high grade squamous intraepithelial lesions VIN 3 involving the margins.  She had her breast reconstruction surgery last week and had a cracked rib from her expander.  She has been having pain with coughing and deep breaths.  She is taking the Arimidex daily and denies hot flashes, joint pains, dryness, or any other concerns.  Otherwise, a 10 point ROS is neg.    MEDICAL HISTORY: Past Medical History  Diagnosis Date  . Thyroid disease   . Depression   . Sprue   . Diverticulosis   . Hyperlipidemia   . Breast cancer     right  . Hypothyroidism   . Pneumonia   . DVT (deep venous thrombosis)     Right lower extremity  . Wears glasses     ALLERGIES:  is allergic to ciprofloxacin and gluten meal.  MEDICATIONS:  Current Outpatient Prescriptions  Medication Sig Dispense Refill  .  alendronate (FOSAMAX) 70 MG tablet Take 70 mg by mouth once a week. Take with a full glass of water on an empty stomach.      [name] anastrozole (ARIMIDEX) 1 MG tablet Take 1 mg by mouth daily.      . B Complex-C (SUPER B COMPLEX PO) Take 1 tablet by mouth daily.      [name] enoxaparin (LOVENOX) 100 MG/ML injection Inject 100 mg into the skin daily.      [name] gabapentin (NEURONTIN) 100 MG capsule Take 200 mg by mouth 2 (two) times daily.      [name] HYDROcodone-acetaminophen (NORCO/VICODIN) 5-325 MG per tablet Take 1 tablet  by mouth every 4 (four) hours as needed for moderate pain or severe pain.       [name] levothyroxine (SYNTHROID, LEVOTHROID) 25 MCG tablet Take 25 mcg by mouth daily.      [name] lidocaine-prilocaine (EMLA) cream Apply 1 application topically daily as needed (port).       [name] liothyronine (CYTOMEL) 25 MCG tablet Take 12.5 mcg by mouth daily.      . Multiple Vitamin (MULTIVITAMIN) tablet Take 1 tablet by mouth daily.      . prochlorperazine (COMPAZINE) 10 MG tablet Take 10 mg by mouth every 6 (six) hours as needed for nausea.      [name] venlafaxine XR (EFFEXOR-XR) 75 MG 24 hr capsule Take 75 mg by mouth daily with breakfast.      . XARELTO 20 MG TABS tablet        No current facility-administered medications for this visit.    SURGICAL HISTORY:  Past Surgical History  Procedure Laterality Date  . Esophagogastroduodenoscopy  2007    sprue  . Colonoscopy  2006  . Cryoblation of cervix    . Breast lumpectomy    . Total mastectomy Bilateral 09/11/2012    Procedure: bilateral MASTECTOMY;  Surgeon: Adolph Pollack, MD;  Location: [facility] OR;  Service: General;  Laterality: Bilateral;  . Axillary sentinel node biopsy Right 09/11/2012    Procedure: AXILLARY SENTINEL lymph NODE  BIOPSY;  Surgeon: Adolph Pollack, MD;  Location: [facility] OR;  Service: General;  Laterality: Right;  right nuclear medicine injection 12:30   . Breast reconstruction with placement of tissue expander and flex hd (acellular hydrated dermis) Bilateral 09/11/2012    Procedure: BREAST RECONSTRUCTION WITH PLACEMENT OF TISSUE EXPANDER AND FLEX HD (ACELLULAR HYDRATED DERMIS) ADM;  Surgeon: Wayland Denis, DO;  Location: [facility] OR;  Service: Plastics;  Laterality: Bilateral;  . Incision and drainage of wound Left 09/16/2012    Procedure: Left Breast Evacuation of Hematoma;  Surgeon: Wayland Denis, DO;  Location: [facility] OR;  Service: Plastics;  Laterality: Left;  . Portacath placement Right 10/09/2012    Procedure: US GUIDED INSERTION PORT-A-CATH;  Surgeon: Adolph Pollack, MD;  Location: [facility];  Service: General;  Laterality: Right;  Right Subclavian Vein  . Removal of bilateral tissue expanders with placement of bilateral breast implants Bilateral 06/24/2013    Procedure: REMOVAL OF BILATERAL TISSUE EXPANDERS WITH PLACEMENT OF BILATERAL BREAST IMPLANTS;  Surgeon: Wayland Denis, DO;  Location: [facility];  Service: Plastics;  Laterality: Bilateral;    REVIEW OF SYSTEMS:   A 10 point review of systems was conducted and is otherwise negative except for what is noted above.      PHYSICAL EXAMINATION: Blood pressure 129/85, pulse 89, temperature 97.9 F (36.6 C), temperature source Oral, resp. rate 20, height 5' 5.82" (1.672 m), weight 146  lb 9.6 oz (66.497 kg), last menstrual period 09/11/2012. Body mass index is 23.79 kg/(m^2). General: Patient is a well appearing female in no acute distress HEENT: PERRLA, sclerae anicteric no conjunctival pallor, MMM Neck: supple, no palpable adenopathy Lungs: clear to auscultation bilaterally, no wheezes, rhonchi, or rales Cardiovascular: regular rate rhythm, S1, S2, no murmurs, rubs or gallops Abdomen: Soft, non-tender, non-distended, normoactive bowel sounds, no HSM Extremities: warm and well perfused, no clubbing, cyanosis, or edema Skin: No rashes or lesions, scalp erythema appears to be a birthmark Neuro: Non-focal Breasts: bilateral mastectomy sites are well healed, no nodularity, masses, or skin changes, expanders in place. ECOG PERFORMANCE STATUS: 1 - Symptomatic but completely ambulatory  LABORATORY DATA: Lab Results  Component Value Date   WBC 4.8 07/06/2013   HGB 12.5 07/06/2013   HCT 38.1 07/06/2013   MCV 87.8 07/06/2013   PLT 259 07/06/2013      Chemistry      Component Value Date/Time   NA 141 05/13/2013 0801   NA 138 09/15/2012 2035   K 3.8 05/13/2013 0801   K 3.6 09/15/2012 2035   CL 105 01/06/2013 0827   CL 103 09/15/2012 2035   CO2 22 05/13/2013 0801    CO2 26 09/15/2012 2035   BUN 10.4 05/13/2013 0801   BUN 4* 09/15/2012 2035   CREATININE 0.7 05/13/2013 0801   CREATININE 0.54 09/15/2012 2035      Component Value Date/Time   CALCIUM 9.5 05/13/2013 0801   CALCIUM 9.0 09/15/2012 2035   ALKPHOS 172* 05/13/2013 0801   ALKPHOS 142* 09/15/2012 2035   AST 59* 05/13/2013 0801   AST 32 09/15/2012 2035   ALT 58* 05/13/2013 0801   ALT 24 09/15/2012 2035   BILITOT 0.47 05/13/2013 0801   BILITOT 0.8 09/15/2012 2035     ADDITIONAL INFORMATION: 3. CHROMOGENIC IN-SITU HYBRIDIZATION Interpretation HER-2/NEU BY CISH - NO AMPLIFICATION OF HER-2 DETECTED. THE RATIO OF HER-2: CEP 17 SIGNALS WAS 1.52. Reference range: Ratio: HER2:CEP17 < 1.8 - gene amplification not observed Ratio: HER2:CEP 17 1.8-2.2 - equivocal result Ratio: HER2:CEP17 > 2.2 - gene amplification observed Pecola Leisure MD Pathologist, Electronic Signature ( Signed 09/22/2012) 3. PROGNOSTIC INDICATORS - ACIS Results IMMUNOHISTOCHEMICAL AND MORPHOMETRIC ANALYSIS BY THE AUTOMATED CELLULAR IMAGING SYSTEM (ACIS) Estrogen Receptor (Negative, <1%): 2%, POSITIVE, WEAK STAINING INTENSITY Progesterone Receptor (Negative, <1%): 0%, NEGATIVE COMMENT: The negative hormone receptor study in this case has an internal positive control. All controls stained appropriately Pecola Leisure MD Pathologist, Electronic Signature ( Signed 09/22/2012) 1 of 4 FINAL for Sauve, Gabbrielle A [phone]) FINAL DIAGNOSIS Diagnosis 1. Lymph node, sentinel, biopsy, Right axilla - ONE LYMPH NODE, NEGATIVE FOR TUMOR (0/1). 2. Breast, simple mastectomy, Left - BENIGN BREAST TISSUE WITH INTRADUCTAL PAPILLOMA, SEE COMMENT. - NEGATIVE FOR ATYPIA OR MALIGNANCY. - SURGICAL MARGINS, NEGATIVE FOR ATYPIA OR MALIGNANCY. - MICROCALCIFICATIONS IDENTIFIED. 3. Breast, simple mastectomy, Right - INVASIVE DUCTAL CARCINOMA, GRADE III, WITH SPINDLE CELL DIFFERENTIATION (METAPLASTIC CARCINOMA) (2.3CM), SEE COMMENT. - NO LYMPHOVASCULAR  INVASION IDENTIFIED. - INVASIVE TUMOR IS 1.5 CM FROM NEAREST MARGIN (DEEP). - DUCTAL CARCINOMA IN SITU, GRADE III, WITH COMEDONECROSIS. - SEE TUMOR SYNOPTIC TEMPLATE BELOW. Microscopic Comment 2. Although there was no mass grossly identified, there are definitive morphologic features of intraductal papilloma with associated fibrocystic change present. In addition, there are fibrocystic changes with usual ductal hyperplasia in representative sections not involved by papilloma. Finally, foci of sclerosing adenosis and microcalcifications in benign ducts and lobules are present. The surgical resection margin(s) of the specimen  were inked and microscopically evaluated. 3. BREAST, INVASIVE TUMOR, WITH LYMPH NODE SAMPLING Specimen, including laterality: Right breast. Procedure: Simple mastectomy. Grade: III of III. Tubule formation: 3. Nuclear pleomorphism: 3. Mitotic: 2. Tumor size (gross measurement): 2.3 cm. Margins: Invasive, distance to closest margin: 1.5 cm. In-situ, distance to closest margin: 1.5 cm (deep). If margin positive, focally or broadly: N/A. Lymphovascular invasion: Absent. Ductal carcinoma in situ: Present. Grade: III of III. Extensive intraductal component: Absent. Lobular neoplasia: Absent. Tumor focality: Unifocal. Treatment effect: None. If present, treatment effect in breast tissue, lymph nodes or both: N/A. Extent of tumor: Skin and Nipple: Grossly negative. Skeletal muscle: N/A. Lymph nodes: # examined: 1. Lymph nodes with metastasis: 0. Breast prognostic profile: Estrogen receptor: Repeated; previous study demonstrates 0% positivity ([id number]). Progesterone receptor: Repeated; previous study demonstrates 0% positivity ([id number]). HER-2/neu: Repeated; previous study demonstrated no amplification (1.55) ([id number]). 2 of 4 FINAL for DAJANEE, VOORHEIS A [phone]) Microscopic Comment(continued) Ki-67: Not repeated; previous study demonstrates 34%  proliferation rate [phone]). Non-neoplastic breast: Intraductal papilloma with fibrocystic change and usual ductal hyperplasia, fibrocystic change with usual ductal hyperplasia, microcalcifications, and previous biopsy site. TNM: pT2, pN0, pMX. Comments: Although there are definitive features of high grade invasive ductal carcinoma identified, there are multiple foci where the carcinoma assumes a spindle cell morphology / spindle cell differentiation. 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 There are no heterologous elements identified. (CRR:eps 09/15/12)  RADIOGRAPHIC STUDIES:  Chest 2 View  09/08/2012  *RADIOLOGY REPORT*  Clinical Data: Right-sided breast  CHEST - 2 VIEW  Comparison: Chest x-ray 12/06/2009.  Findings: Lung volumes are normal.  No consolidative airspace disease.  No pleural effusions.  No pneumothorax.  No pulmonary nodule or mass noted.  Pulmonary vasculature and the cardiomediastinal silhouette are within normal limits.  Mild bilateral apical pleuroparenchymal thickening most compatible with chronic scarring, unchanged compared to the prior examination.  IMPRESSION: 1. No radiographic evidence of acute cardiopulmonary disease.   Original Report Authenticated By: Trudie Reed, M.D.    Nm Sentinel Node Inj-no Rpt (breast)  09/11/2012  CLINICAL DATA: Invasive right breast cancer   Sulfur colloid was injected intradermally by the nuclear medicine  technologist for breast cancer sentinel node localization.      ASSESSMENT: 57 year old female with  #1 invasive ductal carcinoma of the right breast status post bilateral mastectomies for a 2.3 cm node negative essentially triple negative disease on the right. Without any evidence of malignancy on the left. Patient underwent 4 cycles of Adriamycin/Cytoxan followed by 12 cycles of Taxol/Carboplatin.  Since her tumor was 2% estrogen receptor positive  we will utilize an aromatase inhibitor for 5 years.  She started Arimidex on 05/19/13.    #2 patient is also having considerable anxiety and will continue to take Effexor daily.    #3 Right lower extremity DVT: on Enoxaparin prior to surgery  #4Neuropathy: Gabapentin and Super B complex.    #5 Osteoporosis: On Fosamax every week.  Last Bone density was on 04/08/13.  She also takes Calcium, Vitamin D, and does weight bearing exercises.    PLAN:   1.  Patient is doing well today.  She is tolerating the Arimidex very well.  She will continue this.    2.  She is taking the Fosamax weekly and tolerating it well.    3.  She has her surgery planned for her VIN lesion on 07/14/13.     4.  For her  DVT, after her reconstruction her Lovenox dosing was prophylactic.  Once she finished with that, she was started on Enoxaparin 1.5mg /kg/day.  She will take this up until prior to her VIN surgery.    5.  We will see her back in 3 months for labs and evaluation.    All questions were answered. The patient knows to call the clinic with any problems, questions or concerns. We can certainly see the patient much sooner if necessary.  I spent 25 minutes counseling the patient face to face. The total time spent in the appointment was 30 minutes.  Illa Level, NP Medical Oncology [facility] [phone] 07/06/2013, 9:16 AM

## 2013-07-07 ENCOUNTER — Encounter (HOSPITAL_COMMUNITY): Payer: Self-pay

## 2013-07-07 ENCOUNTER — Encounter (HOSPITAL_COMMUNITY)
Admission: RE | Admit: 2013-07-07 | Discharge: 2013-07-07 | Disposition: A | Discharge status: Home / Self Care | Payer: Private Health Insurance - Indemnity | Source: Ambulatory Visit | Attending: Gynecology | Admitting: Gynecology

## 2013-07-07 HISTORY — DX: Personal history of antineoplastic chemotherapy: Z92.21

## 2013-07-07 HISTORY — DX: Polyneuropathy, unspecified: G62.9

## 2013-07-07 NOTE — Patient Instructions (Addendum)
20 Nancy Beard  07/07/2013   Your procedure is scheduled on: 07/14/13  Report to [facility] at  5:15 AM.  Call this number if you have problems the morning of surgery [phone]: [phone]   Remember:   Do not eat food or drink liquids After Midnight.     Take these medicines the morning of surgery with A SIP OF WATER: gabapentin, hydrocodone if needed, synthroid, effexor, cytorvel    Do not wear jewelry, make-up or nail polish.  Do not wear lotions, powders, or perfumes. You may wear deodorant.  Do not shave 48 hours prior to surgery. Men may shave face and neck.  Do not bring valuables to the hospital.  Contacts, dentures or bridgework may not be worn into surgery.   Patients discharged the day of surgery will not be allowed to drive home.  Name and phone number of your driver: Marthann Schiller [phone]    Please read over the following fact sheets that you were given: incentive spirometry fact sheet Birdie Sons, RN  pre op nurse call if needed [phone]    FAILURE TO FOLLOW THESE INSTRUCTIONS MAY RESULT IN CANCELLATION OF YOUR SURGERY   Patient Signature: ___________________________________________

## 2013-07-07 NOTE — Progress Notes (Addendum)
Chest x-ray 06/24/13 on EPIC, EKG 09/13/12 on EPIC, CBC with diff 07/06/13 on EPIC, CMET 07/06/13 on EPIC, alkaline phosphate faxed to Dr. Stanford Breed

## 2013-07-13 ENCOUNTER — Telehealth: Payer: Self-pay | Admitting: Gynecologic Oncology

## 2013-07-13 NOTE — Telephone Encounter (Signed)
Telephone call to check on pre-operative status.  Patient complaint with pre-operative instructions.  Reinforced NPO after midnight.  No questions or concerns voiced.  Instructed to call for any needs. 

## 2013-07-14 ENCOUNTER — Ambulatory Visit (HOSPITAL_COMMUNITY): Payer: Private Health Insurance - Indemnity | Admitting: Anesthesiology

## 2013-07-14 ENCOUNTER — Encounter (HOSPITAL_COMMUNITY)
Admission: RE | Disposition: A | Discharge status: Home / Self Care | Payer: Self-pay | Source: Ambulatory Visit | Attending: Gynecology

## 2013-07-14 ENCOUNTER — Ambulatory Visit (HOSPITAL_COMMUNITY)
Admission: RE | Admit: 2013-07-14 | Discharge: 2013-07-14 | Disposition: A | Discharge status: Home / Self Care | Payer: Private Health Insurance - Indemnity | Source: Ambulatory Visit | Attending: Gynecology | Admitting: Gynecology

## 2013-07-14 ENCOUNTER — Encounter (HOSPITAL_COMMUNITY): Payer: Private Health Insurance - Indemnity | Admitting: Anesthesiology

## 2013-07-14 ENCOUNTER — Encounter (HOSPITAL_COMMUNITY): Payer: Self-pay | Admitting: *Deleted

## 2013-07-14 DIAGNOSIS — Z7901 Long term (current) use of anticoagulants: Secondary | ICD-10-CM | POA: Insufficient documentation

## 2013-07-14 DIAGNOSIS — C50319 Malignant neoplasm of lower-inner quadrant of unspecified female breast: Secondary | ICD-10-CM

## 2013-07-14 DIAGNOSIS — Z803 Family history of malignant neoplasm of breast: Secondary | ICD-10-CM | POA: Insufficient documentation

## 2013-07-14 DIAGNOSIS — E785 Hyperlipidemia, unspecified: Secondary | ICD-10-CM | POA: Insufficient documentation

## 2013-07-14 DIAGNOSIS — I82409 Acute embolism and thrombosis of unspecified deep veins of unspecified lower extremity: Secondary | ICD-10-CM | POA: Insufficient documentation

## 2013-07-14 DIAGNOSIS — Z9010 Acquired absence of unspecified breast and nipple: Secondary | ICD-10-CM | POA: Insufficient documentation

## 2013-07-14 DIAGNOSIS — Z9221 Personal history of antineoplastic chemotherapy: Secondary | ICD-10-CM | POA: Insufficient documentation

## 2013-07-14 DIAGNOSIS — D071 Carcinoma in situ of vulva: Secondary | ICD-10-CM | POA: Insufficient documentation

## 2013-07-14 DIAGNOSIS — E039 Hypothyroidism, unspecified: Secondary | ICD-10-CM | POA: Insufficient documentation

## 2013-07-14 DIAGNOSIS — Z853 Personal history of malignant neoplasm of breast: Secondary | ICD-10-CM | POA: Insufficient documentation

## 2013-07-14 DIAGNOSIS — Z79899 Other long term (current) drug therapy: Secondary | ICD-10-CM | POA: Insufficient documentation

## 2013-07-14 HISTORY — PX: VULVECTOMY: SHX1086

## 2013-07-14 SURGERY — WIDE EXCISION VULVECTOMY
Anesthesia: General | Site: Vulva

## 2013-07-14 MED ORDER — FENTANYL CITRATE 0.05 MG/ML IJ SOLN
INTRAMUSCULAR | Status: AC
  Filled 2013-07-14: qty 5

## 2013-07-14 MED ORDER — MIDAZOLAM HCL 5 MG/5ML IJ SOLN
INTRAMUSCULAR | Status: DC | PRN
  Administered 2013-07-14: 2 mg via INTRAVENOUS

## 2013-07-14 MED ORDER — FENTANYL CITRATE 0.05 MG/ML IJ SOLN
25.0000 ug | INTRAMUSCULAR | Status: DC | PRN
  Administered 2013-07-14: 50 ug via INTRAVENOUS

## 2013-07-14 MED ORDER — SODIUM CHLORIDE 0.9 % IV SOLN: 250.0000 mL | INTRAVENOUS | Status: DC | PRN

## 2013-07-14 MED ORDER — FENTANYL CITRATE 0.05 MG/ML IJ SOLN
INTRAMUSCULAR | Status: AC
  Filled 2013-07-14: qty 2

## 2013-07-14 MED ORDER — SODIUM CHLORIDE 0.9 % IJ SOLN: 3.0000 mL | Freq: Two times a day (BID) | INTRAMUSCULAR | Status: DC

## 2013-07-14 MED ORDER — PROPOFOL 10 MG/ML IV BOLUS
INTRAVENOUS | Status: AC
  Filled 2013-07-14: qty 20

## 2013-07-14 MED ORDER — PROPOFOL 10 MG/ML IV BOLUS
INTRAVENOUS | Status: DC | PRN
  Administered 2013-07-14: 150 mg via INTRAVENOUS

## 2013-07-14 MED ORDER — PROMETHAZINE HCL 25 MG/ML IJ SOLN: 6.2500 mg | INTRAMUSCULAR | Status: DC | PRN

## 2013-07-14 MED ORDER — ACETIC ACID 5 % SOLN
Freq: Once | Status: DC
  Filled 2013-07-14: qty 500

## 2013-07-14 MED ORDER — LIDOCAINE HCL (CARDIAC) 20 MG/ML IV SOLN
INTRAVENOUS | Status: AC
  Filled 2013-07-14: qty 5

## 2013-07-14 MED ORDER — FENTANYL CITRATE 0.05 MG/ML IJ SOLN
INTRAMUSCULAR | Status: DC | PRN
  Administered 2013-07-14 (×2): 50 ug via INTRAVENOUS

## 2013-07-14 MED ORDER — ACETIC ACID 5 % SOLN
Status: DC | PRN
  Administered 2013-07-14: 1 via TOPICAL

## 2013-07-14 MED ORDER — 0.9 % SODIUM CHLORIDE (POUR BTL) OPTIME
TOPICAL | Status: DC | PRN
  Administered 2013-07-14: 1000 mL

## 2013-07-14 MED ORDER — MIDAZOLAM HCL 2 MG/2ML IJ SOLN
INTRAMUSCULAR | Status: AC
  Filled 2013-07-14: qty 2

## 2013-07-14 MED ORDER — OXYCODONE HCL 5 MG PO TABS: 5.0000 mg | ORAL_TABLET | ORAL | Status: DC | PRN

## 2013-07-14 MED ORDER — LIDOCAINE-EPINEPHRINE 1 %-1:100000 IJ SOLN
INTRAMUSCULAR | Status: AC
  Filled 2013-07-14: qty 1

## 2013-07-14 MED ORDER — ACETAMINOPHEN 325 MG PO TABS: 650.0000 mg | ORAL_TABLET | ORAL | Status: DC | PRN

## 2013-07-14 MED ORDER — LACTATED RINGERS IV SOLN
INTRAVENOUS | Status: DC | PRN
  Administered 2013-07-14: 07:00:00 via INTRAVENOUS

## 2013-07-14 MED ORDER — LIDOCAINE HCL (CARDIAC) 20 MG/ML IV SOLN
INTRAVENOUS | Status: DC | PRN
  Administered 2013-07-14: 80 mg via INTRAVENOUS

## 2013-07-14 MED ORDER — ONDANSETRON HCL 4 MG/2ML IJ SOLN
4.0000 mg | Freq: Four times a day (QID) | INTRAMUSCULAR | Status: DC | PRN
Start: 2013-07-14 — End: 2013-07-14

## 2013-07-14 MED ORDER — ACETAMINOPHEN 650 MG RE SUPP
650.0000 mg | RECTAL | Status: DC | PRN
  Filled 2013-07-14: qty 1

## 2013-07-14 MED ORDER — SODIUM CHLORIDE 0.9 % IJ SOLN: 3.0000 mL | INTRAMUSCULAR | Status: DC | PRN

## 2013-07-14 SURGICAL SUPPLY — 24 items
BLADE SURG 15 STRL LF DISP TIS (BLADE) ×2 IMPLANT
BLADE SURG 15 STRL SS (BLADE) ×4
CANISTER SUCTION 2500CC (MISCELLANEOUS) ×2 IMPLANT
CATH ROBINSON RED A/P 16FR (CATHETERS) ×2 IMPLANT
COVER MAYO STAND STRL (DRAPES) ×2 IMPLANT
COVER SURGICAL LIGHT HANDLE (MISCELLANEOUS) ×2 IMPLANT
DRAPE LG THREE QUARTER DISP (DRAPES) ×4 IMPLANT
GLOVE BIO SURGEON STRL SZ7.5 (GLOVE) ×4 IMPLANT
GOWN STRL NON-REIN LRG LVL3 (GOWN DISPOSABLE) ×1 IMPLANT
GOWN STRL REIN XL XLG (GOWN DISPOSABLE) ×4 IMPLANT
NS IRRIG 1000ML POUR BTL (IV SOLUTION) ×2 IMPLANT
PACK COOL COMFORT PERI COLD (SET/KITS/TRAYS/PACK) ×11 IMPLANT
PACK MINOR VAGINAL W LONG (CUSTOM PROCEDURE TRAY) ×2 IMPLANT
PENCIL BUTTON HOLSTER BLD 10FT (ELECTRODE) ×2 IMPLANT
SPONGE LAP 18X18 X RAY DECT (DISPOSABLE) ×2 IMPLANT
SUT VIC AB 2-0 CT2 27 (SUTURE) ×4 IMPLANT
SUT VIC AB 2-0 SH 27 (SUTURE) ×4
SUT VIC AB 2-0 SH 27X BRD (SUTURE) IMPLANT
SUT VIC AB 3-0 PS2 18 (SUTURE)
SUT VIC AB 3-0 PS2 18XBRD (SUTURE) IMPLANT
SUT VICRYL RAPIDE 3 0 (SUTURE) ×6 IMPLANT
TOWEL OR 17X26 10 PK STRL BLUE (TOWEL DISPOSABLE) ×2 IMPLANT
WATER STERILE IRR 1500ML POUR (IV SOLUTION) ×2 IMPLANT
YANKAUER SUCT BULB TIP 10FT TU (MISCELLANEOUS) ×2 IMPLANT

## 2013-07-14 NOTE — Interval H&P Note (Signed)
History and Physical Interval Note:  07/14/2013 7:04 AM  Nancy Beard  has presented today for surgery, with the diagnosis of VULVAR LESION   The various methods of treatment have been discussed with the patient and family. After consideration of risks, benefits and other options for treatment, the patient has consented to  Procedure(s): WIDE LOCAL  EXCISION VULVAR (N/A) as a surgical intervention .  The patient's history has been reviewed, patient examined, no change in status, stable for surgery.  I have reviewed the patient's chart and labs.  Questions were answered to the patient's satisfaction.   De Blanch, M.D  De Blanch Elbert Ewings

## 2013-07-14 NOTE — Transfer of Care (Signed)
Immediate Anesthesia Transfer of Care Note  Patient: Nancy Beard  Procedure(s) Performed: Procedure(s): WIDE LOCAL  EXCISION VULVAR (N/A)  Patient Location: PACU  Anesthesia Type:General  Level of Consciousness: awake, alert  and oriented  Airway & Oxygen Therapy: Patient Spontanous Breathing and Patient connected to face mask oxygen  Post-op Assessment: Report given to PACU RN and Post -op Vital signs reviewed and stable  Post vital signs: Reviewed and stable  Complications: No apparent anesthesia complications

## 2013-07-14 NOTE — H&P (View-Only) (Signed)
Consult Note: Gyn-Onc  Consult was requested by Dr. Dareen Piano for the evaluation of Nancy Beard 57 y.o. female  CC:  Chief Complaint  Patient presents with  . Vulvar lesion    New Consult    Assessment/Plan:  Nancy Beard  is a 57 y.o.  with VIN 3 confirmed via biopsy that was collected on 06/19/2013. The plan is for a wide local excision to be performed by Dr. De Blanch on 07/14/2013. Lovenox will be discontinued 1 day prior to the procedure. Risks of the procedure were discussed with the patient inclusive of infection bleeding.  Issues of the patient and her husband were answered to their satisfaction.  Patient has questions regarding hysterectomy and bilateral salpingo-oophorectomy to eliminate the risk of ovarian or endometrial cancer. The cancer was triple negative she is not on tamoxifen her germline mutation testing is negative and the review of the family history is notable for overwhelming predominance of breast cancer.   HPI:Nancy Beard  is a 57 y.o.  gravida 2 para 1 who on routine pelvic examination was noted to have a lesion on the perineum. It was biopsied and the results were consistent with a high-grade squamous intraepithelial lesion VIN 3 involving the margins. The patient denies any history of abnormal Pap test. Last Pap test in December of 2014 was within normal limits. Denies pruritus or bleeding from the area.  Her history is notable for triple negative right-sided breast cancer diagnosed in December of 2013. She was treated with bilateral mastectomy in February of 2014 and received adjuvant chemotherapy that was completed in August of 2014. Reconstructive surgery was completed this month. Of note the tumor is estrogen receptor negative (2%+) and she has an on and aromatase inhibitor. Genetic testing has been negative for germline mutation. She's currently on Lovenox for a right lower  extremity DVT  Current Meds:  Outpatient Encounter  Prescriptions as of 06/30/2013  Medication Sig  . alendronate (FOSAMAX) 70 MG tablet Take 1 tablet (70 mg total) by mouth once a week. Take with a full glass of water on an empty stomach.  [name] anastrozole (ARIMIDEX) 1 MG tablet Take 1 tablet (1 mg total) by mouth daily.  . B Complex-C (SUPER B COMPLEX PO) Take 1 tablet by mouth daily.  [name] gabapentin (NEURONTIN) 100 MG capsule Take 200 mg by mouth 2 (two) times daily.  [name] gentamicin (GARAMYCIN) 0.3 % ophthalmic solution   . HYDROcodone-acetaminophen (NORCO/VICODIN) 5-325 MG per tablet   . levothyroxine (SYNTHROID, LEVOTHROID) 25 MCG tablet Take 25 mcg by mouth daily.  [name] lidocaine-prilocaine (EMLA) cream   . liothyronine (CYTOMEL) 25 MCG tablet Take 12.5 mcg by mouth daily.  . Multiple Vitamin (MULTIVITAMIN) tablet Take 1 tablet by mouth daily.  [name] venlafaxine XR (EFFEXOR-XR) 75 MG 24 hr capsule Take 75 mg by mouth daily with breakfast.  . prochlorperazine (COMPAZINE) 10 MG tablet Take 1 tablet (10 mg total) by mouth every 6 (six) hours as needed.  . Rivaroxaban (XARELTO) 20 MG TABS tablet Take 1 tablet (20 mg total) by mouth daily.    Allergy:  Allergies  Allergen Reactions  . Ciprofloxacin Itching and Other (See Comments)    Patient states she had muscle spasms  . Gluten Meal     Social Hx:   History   Social History  . Marital Status: Married    Spouse Name: N/A    Number of Children: N/A  . Years of Education: N/A   Occupational  History  . Not on file.   Social History Main Topics  . Smoking status: Never Smoker   . Smokeless tobacco: Never Used  . Alcohol Use: No  . Drug Use: No  . Sexual Activity: Yes    Birth Control/ Protection: Post-menopausal   Other Topics Concern  . Not on file   Social History Narrative  . No narrative on file  Spouse present for the visit.  Appears supportive.  Past Surgical Hx:  Past Surgical History  Procedure Laterality Date  . Esophagogastroduodenoscopy  2007    sprue  . Colonoscopy   2006  . Cryoblation of cervix    . Breast lumpectomy    . Total mastectomy Bilateral 09/11/2012    Procedure: bilateral MASTECTOMY;  Surgeon: Adolph Pollack, MD;  Location: [facility] OR;  Service: General;  Laterality: Bilateral;  . Axillary sentinel node biopsy Right 09/11/2012    Procedure: AXILLARY SENTINEL lymph NODE  BIOPSY;  Surgeon: Adolph Pollack, MD;  Location: [facility] OR;  Service: General;  Laterality: Right;  right nuclear medicine injection 12:30   . Breast reconstruction with placement of tissue expander and flex hd (acellular hydrated dermis) Bilateral 09/11/2012    Procedure: BREAST RECONSTRUCTION WITH PLACEMENT OF TISSUE EXPANDER AND FLEX HD (ACELLULAR HYDRATED DERMIS) ADM;  Surgeon: Wayland Denis, DO;  Location: [facility] OR;  Service: Plastics;  Laterality: Bilateral;  . Incision and drainage of wound Left 09/16/2012    Procedure: Left Breast Evacuation of Hematoma;  Surgeon: Wayland Denis, DO;  Location: [facility] OR;  Service: Plastics;  Laterality: Left;  . Portacath placement Right 10/09/2012    Procedure: US GUIDED INSERTION PORT-A-CATH;  Surgeon: Adolph Pollack, MD;  Location: [facility];  Service: General;  Laterality: Right;  Right Subclavian Vein  . Removal of bilateral tissue expanders with placement of bilateral breast implants Bilateral 06/24/2013    Procedure: REMOVAL OF BILATERAL TISSUE EXPANDERS WITH PLACEMENT OF BILATERAL BREAST IMPLANTS;  Surgeon: Wayland Denis, DO;  Location: [facility];  Service: Plastics;  Laterality: Bilateral;    Past Medical Hx:  Past Medical History  Diagnosis Date  . Thyroid disease   . Depression   . Sprue   . Diverticulosis   . Hyperlipidemia   . Breast cancer     right  . Hypothyroidism   . Pneumonia   . DVT (deep venous thrombosis)     Right lower extremity  . Wears glasses     Past Gynecological History: G2P1 Menarche 77, regular menses until menopause in the late 40's  No h/o abnormal pap ./. Pap 06/2013 wnl  Patient's last menstrual period was 09/11/2012.  Family Hx:  Family History  Problem Relation Age of Onset  . Breast cancer Paternal Aunt     dx in her 57s  . Cancer Paternal Aunt     breast  . Lymphoma Paternal Uncle   . Breast cancer Paternal Grandmother     dx >50  . Cancer Paternal Grandmother     breast  . Heart attack Paternal Grandfather   . Breast cancer Other     2 maternal great aunts with breast cancer >50  . Cancer Other     breast    Review of Systems:  Constitutional  Feels well, Cardiovascular  Chest soreness at the site of the surgical procedure.No  pain, shortness of breath, or edema  Pulmonary  No cough or wheeze.  Gastro Intestinal  No nausea, vomitting, or diarrhoea. No bright red blood  per rectum, no abdominal pain, change in bowel movement, or constipation.  Genito Urinary  No frequency, urgency, dysuria,soreness at the perineum Musculo Skeletal  No myalgia, arthralgia, joint swelling or pain  Neurologic  No weakness, numbness, change in gait,  Psychology  No depression, anxiety, insomnia.   Vitals:  Blood pressure 123/82, pulse 98, temperature 98.8 F (37.1 C), temperature source Oral, resp. rate 16, height 5' 5.32" (1.659 m), weight 145 lb 8 oz (65.998 kg), last menstrual period 09/11/2012.  Physical Exam: WD in NAD Neck  Supple NROM, without any enlargements.  Lymph Node Survey No cervical supraclavicular or inguinal adenopathy Cardiovascular  Pulse normal rate, regularity and rhythm.  Lungs  Clear to auscultation bilateraly, without wheezes/crackles/rhonchi. Good air movement.  Psychiatry  Alert and oriented to person, place, and time appropriate speech affect and judgement Abdomen  Normoactive bowel sounds, abdomen soft, non-tender. Back No CVA tenderness Genito Urinary  Vulva/vagina: Normal external female genitalia.  1cm lesion with central granulation tissue and surrounding dysplastic tissue. Extremities  No bilateral  cyanosis, clubbing or edema.   Laurette Schimke, MD, PhD 06/30/2013, 4:09 PM

## 2013-07-14 NOTE — Anesthesia Preprocedure Evaluation (Addendum)
Anesthesia Evaluation  Patient identified by MRN, date of birth, ID band Patient awake    Reviewed: Allergy & Precautions, H&P , NPO status , Patient's Chart, lab work & pertinent test results  Airway Mallampati: II TM Distance: >3 FB Neck ROM: Full    Dental no notable dental hx.    Pulmonary pneumonia -, resolved,  CXR: No acute disease. breath sounds clear to auscultation  Pulmonary exam normal       Cardiovascular + Peripheral Vascular Disease Rhythm:Regular Rate:Normal     Neuro/Psych PSYCHIATRIC DISORDERS Depression negative neurological ROS     GI/Hepatic negative GI ROS, Neg liver ROS,   Endo/Other  Hypothyroidism   Renal/GU negative Renal ROS  negative genitourinary   Musculoskeletal negative musculoskeletal ROS (+)   Abdominal   Peds negative pediatric ROS (+)  Hematology negative hematology ROS (+)   Anesthesia Other Findings   Reproductive/Obstetrics negative OB ROS                          Anesthesia Physical Anesthesia Plan  ASA: II  Anesthesia Plan: General   Post-op Pain Management:    Induction: Intravenous  Airway Management Planned: LMA  Additional Equipment:   Intra-op Plan:   Post-operative Plan: Extubation in OR  Informed Consent: I have reviewed the patients History and Physical, chart, labs and discussed the procedure including the risks, benefits and alternatives for the proposed anesthesia with the patient or authorized representative who has indicated his/her understanding and acceptance.   Dental advisory given  Plan Discussed with: CRNA  Anesthesia Plan Comments:         Anesthesia Quick Evaluation

## 2013-07-14 NOTE — Op Note (Signed)
CRISSY MCCREADIE  female MEDICAL RECORD ZO:[id number] DATE OF BIRTH: 05/08/1956 PHYSICIAN: De Blanch, M.D  07/14/2013   OPERATIVE REPORT  PREOPERATIVE DIAGNOSIS:   POSTOPERATIVE DIAGNOSIS:  PROCEDURE:   SURGEON: De Blanch, M.D ASSISTANT: ANESTHESIA: LMA ESTIMATED BLOOD LOSS: Minimal  SURGICAL FINDINGS: Examination under anesthesia revealed a 2 cm raised white lesion in the central perineal body. There was no involvement of the anus. The remainder the vulva was normal on gross inspection and after application of acetic acid.  PROCEDURE: The patient was brought to the operating room and after satisfactory attainment of general anesthesia, was placed in a modified lithotomy position in Christoval stirrups. A surgical timeout was taken. The perineum vagina and vulva were prepped and the bladder was drained with a straight catheter. The patient was draped. Acetic acid was applied to the vulva highlighting the lesion on the perineum. An elliptical incision around the entire lesion was created. The skin and a minimal amount of subcutaneous tissue was then excised. The specimen was pinned on cork and oriented for the pathologist. The subcutaneous layer was reapproximated in a midline fashion using 2-0 Vicryl interrupted sutures. The skin was reapproximated using interrupted 3-0 Vicryl sutures.  The patient was awakened from anesthesia and taken to the recovery room in satisfactory condition. Sponge needle and isthmic counts correct x2.   De Blanch, M.D

## 2013-07-14 NOTE — Anesthesia Postprocedure Evaluation (Signed)
  Anesthesia Post-op Note  Patient: Nancy Beard  Procedure(s) Performed: Procedure(s) (LRB): WIDE LOCAL  EXCISION VULVAR (N/A)  Patient Location: PACU  Anesthesia Type: General  Level of Consciousness: awake and alert   Airway and Oxygen Therapy: Patient Spontanous Breathing  Post-op Pain: mild  Post-op Assessment: Post-op Vital signs reviewed, Patient's Cardiovascular Status Stable, Respiratory Function Stable, Patent Airway and No signs of Nausea or vomiting  Last Vitals:  Filed Vitals:   07/14/13 1004  BP: 126/84  Pulse: 75  Temp: 36.6 C  Resp: 16    Post-op Vital Signs: stable   Complications: No apparent anesthesia complications

## 2013-07-15 ENCOUNTER — Telehealth: Payer: Self-pay | Admitting: Adult Health

## 2013-07-15 ENCOUNTER — Encounter (HOSPITAL_COMMUNITY): Payer: Self-pay | Admitting: Gynecology

## 2013-07-15 NOTE — Telephone Encounter (Signed)
Received call from patient's husband regarding the patient's Xarelto.  Patient underwent wide local excision yesterday for VIN.  They have been trying to contact the cancer center since 8 am, and he finally was sent to my phone line.  He was not instructed about Lovenox or Xarelto use following surgery yesterday.  I spoke with Warner Mccreedy NP, and Dr. Grant Ruts, and the patient will restart Xarelto today at her usual dose.  I called and discussed this with the patient and she verbalized understanding.    Illa Level, NP Medical Oncology [facility] [phone]

## 2013-07-20 ENCOUNTER — Telehealth: Payer: Self-pay | Admitting: Oncology

## 2013-07-20 ENCOUNTER — Other Ambulatory Visit: Payer: Self-pay | Admitting: *Deleted

## 2013-07-20 DIAGNOSIS — C50311 Malignant neoplasm of lower-inner quadrant of right female breast: Secondary | ICD-10-CM

## 2013-07-20 MED ORDER — ANASTROZOLE 1 MG PO TABS: 1.0000 mg | ORAL_TABLET | Freq: Every day | ORAL | Status: DC

## 2013-07-20 MED ORDER — ALENDRONATE SODIUM 70 MG PO TABS: 70.0000 mg | ORAL_TABLET | ORAL | Status: DC

## 2013-07-21 ENCOUNTER — Telehealth: Payer: Self-pay | Admitting: *Deleted

## 2013-07-21 NOTE — Telephone Encounter (Signed)
Per  Dr. Fermin Schwab notified pt of Pathology results from  12/30 -no invasion( just dysplasia) and margins are " negative". Pt verbalized understanding, thanked me for the call and confirmed she will see [place] on 1/16 for f/u with MD. No further questions at this time.

## 2013-07-23 ENCOUNTER — Telehealth (INDEPENDENT_AMBULATORY_CARE_PROVIDER_SITE_OTHER): Payer: Self-pay

## 2013-07-23 NOTE — Telephone Encounter (Signed)
Appt scheduled for 08/03/13 at 4:45pm.

## 2013-07-23 NOTE — Telephone Encounter (Signed)
LMOV to call office.  Pt will need to be seen by Dr. Zella Richer before her [facility] is removed.

## 2013-07-31 ENCOUNTER — Ambulatory Visit: Payer: Private Health Insurance - Indemnity | Attending: Gynecology | Admitting: Gynecology

## 2013-07-31 ENCOUNTER — Encounter: Payer: Self-pay | Admitting: Gynecology

## 2013-07-31 VITALS — BP 125/81 | HR 87 | Temp 98.3°F | Resp 22 | Wt 147.3 lb

## 2013-07-31 DIAGNOSIS — R229 Localized swelling, mass and lump, unspecified: Secondary | ICD-10-CM | POA: Insufficient documentation

## 2013-07-31 DIAGNOSIS — Z7901 Long term (current) use of anticoagulants: Secondary | ICD-10-CM | POA: Insufficient documentation

## 2013-07-31 DIAGNOSIS — E039 Hypothyroidism, unspecified: Secondary | ICD-10-CM | POA: Insufficient documentation

## 2013-07-31 DIAGNOSIS — C50919 Malignant neoplasm of unspecified site of unspecified female breast: Secondary | ICD-10-CM | POA: Insufficient documentation

## 2013-07-31 DIAGNOSIS — D071 Carcinoma in situ of vulva: Secondary | ICD-10-CM | POA: Insufficient documentation

## 2013-07-31 DIAGNOSIS — Z9010 Acquired absence of unspecified breast and nipple: Secondary | ICD-10-CM | POA: Insufficient documentation

## 2013-07-31 DIAGNOSIS — Z9221 Personal history of antineoplastic chemotherapy: Secondary | ICD-10-CM | POA: Insufficient documentation

## 2013-07-31 DIAGNOSIS — I824Y9 Acute embolism and thrombosis of unspecified deep veins of unspecified proximal lower extremity: Secondary | ICD-10-CM | POA: Insufficient documentation

## 2013-07-31 DIAGNOSIS — F3289 Other specified depressive episodes: Secondary | ICD-10-CM | POA: Insufficient documentation

## 2013-07-31 DIAGNOSIS — F329 Major depressive disorder, single episode, unspecified: Secondary | ICD-10-CM | POA: Insufficient documentation

## 2013-07-31 DIAGNOSIS — Z79899 Other long term (current) drug therapy: Secondary | ICD-10-CM | POA: Insufficient documentation

## 2013-07-31 DIAGNOSIS — Z978 Presence of other specified devices: Secondary | ICD-10-CM | POA: Insufficient documentation

## 2013-07-31 NOTE — Progress Notes (Signed)
Consult Note: Gyn-Onc   Nancy Beard 58 y.o. female  Chief Complaint  Patient presents with  . Vulvar Lesion    Assessment :  VIN 3 status post wide local excision on 07/14/2013. Good postoperative recovery.  Plan: Final pathology report was discussed with the patient. The margins are negative. She return to see me in 6-8 weeks for final postoperative checkup.  Interval History: The patient underwent a wide local excision of the perineum for treatment of VIN 3 on 07/14/2013. Final pathology showed CIN-2 with negative margins. Patient has had an uncomplicated postoperative course.  HPI: On routine examination the patient was found to have a lesion on the perineum. Biopsy showed VIN 3. She has not had a history of abnormal Pap smears.  Review of Systems:10 point review of systems is negative except as noted in interval history.   Vitals: Blood pressure 125/81, pulse 87, temperature 98.3 F (36.8 C), temperature source Oral, resp. rate 22, weight 147 lb 4.8 oz (66.815 kg), last menstrual period 09/11/2012.  Physical Exam: General : The patient is a healthy woman in no acute distress.  HEENT: normocephalic, extraoccular movements normal; neck is supple without thyromegally  Lynphnodes: Supraclavicular and inguinal nodes not enlarged  Abdomen: Soft, non-tender, no ascites, no organomegally, no masses, no hernias . There is a less than 1 cm nodular subcutaneous area in the left lower quadrant consistent with prior Lovenox injection. Pelvic:  EGBUS: Normal female the midline perineal incision is healing well. Some Vicryl sutures are still in place. Vagina: Normal, no lesions  Urethra and Bladder: Normal, non-tender   Lower extremities: No edema or varicosities. Normal range of motion      Allergies  Allergen Reactions  . Ciprofloxacin Itching and Other (See Comments)    Patient states she had muscle spasms  . Gluten Meal     Past Medical History  Diagnosis Date  .  Thyroid disease   . Depression   . Sprue   . Breast cancer     right  . Hypothyroidism   . DVT (deep venous thrombosis) 2014    Right lower extremity, thigh  . Wears glasses   . Pneumonia     hx of years ago  . History of chemotherapy   . Neuropathy     of fingers and toes    Past Surgical History  Procedure Laterality Date  . Esophagogastroduodenoscopy  2007    sprue  . Colonoscopy  2006  . Cryoblation of cervix      long time ago  . Breast lumpectomy    . Total mastectomy Bilateral 09/11/2012    Procedure: bilateral MASTECTOMY;  Surgeon: Odis Hollingshead, MD;  Location: [place];  Service: General;  Laterality: Bilateral;  . Axillary sentinel node biopsy Right 09/11/2012    Procedure: AXILLARY SENTINEL lymph NODE  BIOPSY;  Surgeon: Odis Hollingshead, MD;  Location: [place];  Service: General;  Laterality: Right;  right nuclear medicine injection 12:30   . Breast reconstruction with placement of tissue expander and flex hd (acellular hydrated dermis) Bilateral 09/11/2012    Procedure: BREAST RECONSTRUCTION WITH PLACEMENT OF TISSUE EXPANDER AND FLEX HD (ACELLULAR HYDRATED DERMIS) ADM;  Surgeon: Theodoro Kos, DO;  Location: [place];  Service: Plastics;  Laterality: Bilateral;  . Incision and drainage of wound Left 09/16/2012    Procedure: Left Breast Evacuation of Hematoma;  Surgeon: Theodoro Kos, DO;  Location: [place];  Service: Plastics;  Laterality: Left;  . Portacath placement Right 10/09/2012  Procedure: US GUIDED INSERTION PORT-A-CATH;  Surgeon: Odis Hollingshead, MD;  Location: [place];  Service: General;  Laterality: Right;  Right Subclavian Vein  . Removal of bilateral tissue expanders with placement of bilateral breast implants Bilateral 06/24/2013    Procedure: REMOVAL OF BILATERAL TISSUE EXPANDERS WITH PLACEMENT OF BILATERAL BREAST IMPLANTS;  Surgeon: Theodoro Kos, DO;  Location: [place];  Service: Plastics;  Laterality: Bilateral;  .  Vulvectomy N/A 07/14/2013    Procedure: WIDE LOCAL  EXCISION VULVAR;  Surgeon: Alvino Chapel, MD;  Location: [facility];  Service: Gynecology;  Laterality: N/A;    Current Outpatient Prescriptions  Medication Sig Dispense Refill  . alendronate (FOSAMAX) 70 MG tablet Take 1 tablet (70 mg total) by mouth once a week. Take with a full glass of water on an empty stomach.  12 tablet  0  . anastrozole (ARIMIDEX) 1 MG tablet Take 1 tablet (1 mg total) by mouth daily.  90 tablet  0  . B Complex-C (SUPER B COMPLEX PO) Take 1 tablet by mouth daily.      [name] enoxaparin (LOVENOX) 100 MG/ML injection Inject 100 mg into the skin daily.      [name] gabapentin (NEURONTIN) 100 MG capsule Take 200 mg by mouth 2 (two) times daily.      [name] HYDROcodone-acetaminophen (NORCO/VICODIN) 5-325 MG per tablet Take 1 tablet by mouth every 4 (four) hours as needed for moderate pain or severe pain.       [name] levothyroxine (SYNTHROID, LEVOTHROID) 25 MCG tablet Take 25 mcg by mouth daily.      [name] lidocaine-prilocaine (EMLA) cream Apply 1 application topically daily as needed (port).       [name] liothyronine (CYTOMEL) 25 MCG tablet Take 12.5 mcg by mouth daily.      . Multiple Vitamin (MULTIVITAMIN) tablet Take 1 tablet by mouth daily.      . prochlorperazine (COMPAZINE) 10 MG tablet Take 10 mg by mouth every 6 (six) hours as needed for nausea.      [name] venlafaxine XR (EFFEXOR-XR) 75 MG 24 hr capsule Take 75 mg by mouth daily with breakfast.      . XARELTO 20 MG TABS tablet        No current facility-administered medications for this visit.    History   Social History  . Marital Status: Married    Spouse Name: N/A    Number of Children: N/A  . Years of Education: N/A   Occupational History  . Not on file.   Social History Main Topics  . Smoking status: Never Smoker   . Smokeless tobacco: Never Used  . Alcohol Use: No  . Drug Use: No  . Sexual Activity: Yes    Birth Control/ Protection: Post-menopausal   Other Topics  Concern  . Not on file   Social History Narrative  . No narrative on file    Family History  Problem Relation Age of Onset  . Breast cancer Paternal Aunt     dx in her 18s  . Cancer Paternal Aunt     breast  . Lymphoma Paternal Uncle   . Breast cancer Paternal Grandmother     dx >50  . Cancer Paternal Grandmother     breast  . Heart attack Paternal Grandfather   . Breast cancer Other     2 maternal great aunts with breast cancer >50  . Cancer Other     breast      CLARKE-PEARSON,Emelina Hinch L,  MD 07/31/2013, 10:39 AM

## 2013-07-31 NOTE — Patient Instructions (Addendum)
Nothing in the vagina for 55months. Follow up with Dr. Fermin Schwab on September 25 2013 at 11:45am.

## 2013-08-03 ENCOUNTER — Ambulatory Visit (INDEPENDENT_AMBULATORY_CARE_PROVIDER_SITE_OTHER): Payer: Private Health Insurance - Indemnity | Admitting: General Surgery

## 2013-08-03 ENCOUNTER — Encounter (INDEPENDENT_AMBULATORY_CARE_PROVIDER_SITE_OTHER): Payer: Self-pay | Admitting: General Surgery

## 2013-08-03 VITALS — BP 112/76 | HR 88 | Temp 97.2°F | Resp 16 | Ht 66.0 in | Wt 149.0 lb

## 2013-08-03 DIAGNOSIS — C50919 Malignant neoplasm of unspecified site of unspecified female breast: Secondary | ICD-10-CM

## 2013-08-03 NOTE — Progress Notes (Signed)
Subjective:     Patient ID: Nancy Beard, female   DOB: [birth year], 58 y.o.   MRN: [id number]  HPI She is here today to discuss her Port-A-Cath removal. She is status post bilateral mastectomies and right axillary sentinel lymph node biopsy for right breast cancer. Since I saw her last, she's been diagnosed with a right leg DVT. She's had breast reconstruction.  She is taking Xarelto.  She is use Lovenox in the past to bridge her anticoagulation for surgery.   Review of Systems  No chest wall masses. No axillary adenopathy the     Objective:   Physical Exam Gen.-she looks well and is in no acute distress. Chest wall-bilateral scars. No chest wall nodularity. Expanders in place. Lymph nodes-no palpable supraclavicular or axillary adenopathy. Right axillary scar.    Assessment:     1. Right breast cancer-no clinical evidence of recurrence.  2. Indwelling Port-A-Cath  3. Anticoagulated state for DVT     Plan:     Port-A-Cath removal. Will need to bridge her anticoagulation with Lovenox and we have discussed this. I have given her instructions on how to do this. She has Lovenox.   We discussed the procedure and the risks. Risks include but are not limited to bleeding, infection, wound healing, anesthesia.

## 2013-08-03 NOTE — Patient Instructions (Signed)
Stop Xarelto 3 days before surgery.  Start Lovenox shots the day after you stop the Xarelto.  No Lovenox the day of surgery.  Start Lovenox back the day after surgery.  Start Xarelto back 3 days after surgery and stop Lovenox.

## 2013-08-04 ENCOUNTER — Encounter (HOSPITAL_BASED_OUTPATIENT_CLINIC_OR_DEPARTMENT_OTHER): Payer: Self-pay | Admitting: *Deleted

## 2013-08-04 NOTE — Progress Notes (Signed)
Will come in for [facility] labs-on lovenox bridge-has been here for surgery

## 2013-08-05 ENCOUNTER — Encounter (HOSPITAL_BASED_OUTPATIENT_CLINIC_OR_DEPARTMENT_OTHER)
Admission: RE | Admit: 2013-08-05 | Discharge: 2013-08-05 | Disposition: A | Discharge status: Home / Self Care | Payer: Private Health Insurance - Indemnity | Source: Ambulatory Visit | Attending: General Surgery | Admitting: General Surgery

## 2013-08-05 LAB — COMPREHENSIVE METABOLIC PANEL
ALT: 39 U/L — ABNORMAL HIGH (ref 0–35)
AST: 39 U/L — ABNORMAL HIGH (ref 0–37)
Albumin: 3.9 g/dL (ref 3.5–5.2)
Alkaline Phosphatase: 181 U/L — ABNORMAL HIGH (ref 39–117)
BUN: 11 mg/dL (ref 6–23)
CO2: 25 mEq/L (ref 19–32)
Calcium: 9.2 mg/dL (ref 8.4–10.5)
Chloride: 102 mEq/L (ref 96–112)
Creatinine, Ser: 0.54 mg/dL (ref 0.50–1.10)
GFR calc Af Amer: >90 mL/min (ref >90)
GFR calc non Af Amer: >90 mL/min (ref >90)
Glucose, Bld: 99 mg/dL (ref 70–99)
Potassium: 3.8 mEq/L (ref 3.7–5.3)
Sodium: 142 mEq/L (ref 137–147)
Total Bilirubin: 0.4 mg/dL (ref 0.3–1.2)
Total Protein: 7.5 g/dL (ref 6.0–8.3)

## 2013-08-05 LAB — CBC WITH DIFFERENTIAL/PLATELET
Basophils Absolute: 0 10*3/uL (ref 0.0–0.1)
Basophils Relative: 0 % (ref 0–1)
Eosinophils Absolute: 0.1 10*3/uL (ref 0.0–0.7)
Eosinophils Relative: 3 % (ref 0–5)
HCT: 40.3 % (ref 36.0–46.0)
Hemoglobin: 13.6 g/dL (ref 12.0–15.0)
Lymphocytes Relative: 47 % — ABNORMAL HIGH (ref 12–46)
Lymphs Abs: 2 10*3/uL (ref 0.7–4.0)
MCH: 28.8 pg (ref 26.0–34.0)
MCHC: 33.7 g/dL (ref 30.0–36.0)
MCV: 85.2 fL (ref 78.0–100.0)
Monocytes Absolute: 0.5 10*3/uL (ref 0.1–1.0)
Monocytes Relative: 11 % (ref 3–12)
Neutro Abs: 1.7 10*3/uL (ref 1.7–7.7)
Neutrophils Relative %: 39 % — ABNORMAL LOW (ref 43–77)
Platelets: 228 10*3/uL (ref 150–400)
RBC: 4.73 MIL/uL (ref 3.87–5.11)
RDW: 14.5 % (ref 11.5–15.5)
WBC: 4.3 10*3/uL (ref 4.0–10.5)

## 2013-08-05 LAB — PROTIME-INR
INR: 1.07 (ref 0.00–1.49)
Prothrombin Time: 13.7 seconds (ref 11.6–15.2)

## 2013-08-07 ENCOUNTER — Encounter (HOSPITAL_BASED_OUTPATIENT_CLINIC_OR_DEPARTMENT_OTHER): Payer: Self-pay | Admitting: Certified Registered"

## 2013-08-07 ENCOUNTER — Ambulatory Visit (HOSPITAL_BASED_OUTPATIENT_CLINIC_OR_DEPARTMENT_OTHER): Payer: Private Health Insurance - Indemnity | Admitting: Certified Registered"

## 2013-08-07 ENCOUNTER — Encounter (HOSPITAL_BASED_OUTPATIENT_CLINIC_OR_DEPARTMENT_OTHER)
Admission: RE | Disposition: A | Discharge status: Home / Self Care | Payer: Self-pay | Source: Ambulatory Visit | Attending: General Surgery

## 2013-08-07 ENCOUNTER — Ambulatory Visit (HOSPITAL_BASED_OUTPATIENT_CLINIC_OR_DEPARTMENT_OTHER)
Admission: RE | Admit: 2013-08-07 | Discharge: 2013-08-07 | Disposition: A | Discharge status: Home / Self Care | Payer: Private Health Insurance - Indemnity | Source: Ambulatory Visit | Attending: General Surgery | Admitting: General Surgery

## 2013-08-07 ENCOUNTER — Encounter (HOSPITAL_BASED_OUTPATIENT_CLINIC_OR_DEPARTMENT_OTHER): Payer: Private Health Insurance - Indemnity | Admitting: Certified Registered"

## 2013-08-07 DIAGNOSIS — I82409 Acute embolism and thrombosis of unspecified deep veins of unspecified lower extremity: Secondary | ICD-10-CM | POA: Insufficient documentation

## 2013-08-07 DIAGNOSIS — Z452 Encounter for adjustment and management of vascular access device: Secondary | ICD-10-CM

## 2013-08-07 DIAGNOSIS — Z9010 Acquired absence of unspecified breast and nipple: Secondary | ICD-10-CM | POA: Insufficient documentation

## 2013-08-07 DIAGNOSIS — Z7901 Long term (current) use of anticoagulants: Secondary | ICD-10-CM | POA: Insufficient documentation

## 2013-08-07 DIAGNOSIS — E039 Hypothyroidism, unspecified: Secondary | ICD-10-CM | POA: Insufficient documentation

## 2013-08-07 HISTORY — PX: PORT-A-CATH REMOVAL: SHX5289

## 2013-08-07 SURGERY — REMOVAL PORT-A-CATH
Anesthesia: Monitor Anesthesia Care | Laterality: Right

## 2013-08-07 MED ORDER — FENTANYL CITRATE 0.05 MG/ML IJ SOLN: 25.0000 ug | INTRAMUSCULAR | Status: DC | PRN

## 2013-08-07 MED ORDER — MIDAZOLAM HCL 2 MG/2ML IJ SOLN
INTRAMUSCULAR | Status: AC
  Filled 2013-08-07: qty 2

## 2013-08-07 MED ORDER — MIDAZOLAM HCL 2 MG/2ML IJ SOLN: 1.0000 mg | INTRAMUSCULAR | Status: DC | PRN

## 2013-08-07 MED ORDER — PROPOFOL INFUSION 10 MG/ML OPTIME
INTRAVENOUS | Status: DC | PRN
  Administered 2013-08-07: 75 ug/kg/min via INTRAVENOUS

## 2013-08-07 MED ORDER — FENTANYL CITRATE 0.05 MG/ML IJ SOLN: 50.0000 ug | INTRAMUSCULAR | Status: DC | PRN

## 2013-08-07 MED ORDER — LACTATED RINGERS IV SOLN
INTRAVENOUS | Status: DC
  Administered 2013-08-07: 07:00:00 via INTRAVENOUS

## 2013-08-07 MED ORDER — FENTANYL CITRATE 0.05 MG/ML IJ SOLN
INTRAMUSCULAR | Status: AC
  Filled 2013-08-07: qty 2

## 2013-08-07 MED ORDER — BUPIVACAINE-EPINEPHRINE PF 0.25-1:200000 % IJ SOLN
INTRAMUSCULAR | Status: AC
  Filled 2013-08-07: qty 60

## 2013-08-07 MED ORDER — OXYCODONE HCL 5 MG/5ML PO SOLN: 5.0000 mg | Freq: Once | ORAL | Status: AC | PRN

## 2013-08-07 MED ORDER — MIDAZOLAM HCL 5 MG/5ML IJ SOLN
INTRAMUSCULAR | Status: DC | PRN
  Administered 2013-08-07: 1 mg via INTRAVENOUS

## 2013-08-07 MED ORDER — LIDOCAINE HCL (CARDIAC) 20 MG/ML IV SOLN
INTRAVENOUS | Status: DC | PRN
  Administered 2013-08-07: 60 mg via INTRAVENOUS

## 2013-08-07 MED ORDER — FENTANYL CITRATE 0.05 MG/ML IJ SOLN
INTRAMUSCULAR | Status: DC | PRN
  Administered 2013-08-07: 50 ug via INTRAVENOUS

## 2013-08-07 MED ORDER — PROPOFOL 10 MG/ML IV EMUL
INTRAVENOUS | Status: AC
  Filled 2013-08-07: qty 150

## 2013-08-07 MED ORDER — CEFAZOLIN SODIUM-DEXTROSE 2-3 GM-% IV SOLR
INTRAVENOUS | Status: AC
  Filled 2013-08-07: qty 50

## 2013-08-07 MED ORDER — SODIUM BICARBONATE 4 % IV SOLN
INTRAVENOUS | Status: AC
  Filled 2013-08-07: qty 10

## 2013-08-07 MED ORDER — LIDOCAINE HCL (PF) 1 % IJ SOLN
INTRAMUSCULAR | Status: AC
  Filled 2013-08-07: qty 60

## 2013-08-07 MED ORDER — HYDROCODONE-ACETAMINOPHEN 5-325 MG PO TABS: 1.0000 | ORAL_TABLET | ORAL | Status: DC | PRN

## 2013-08-07 MED ORDER — CEFAZOLIN SODIUM-DEXTROSE 2-3 GM-% IV SOLR
2.0000 g | INTRAVENOUS | Status: AC
  Administered 2013-08-07: 2 g via INTRAVENOUS

## 2013-08-07 MED ORDER — BUPIVACAINE HCL (PF) 0.5 % IJ SOLN
INTRAMUSCULAR | Status: AC
  Filled 2013-08-07: qty 30

## 2013-08-07 MED ORDER — LIDOCAINE HCL 1 % IJ SOLN
INTRAMUSCULAR | Status: DC | PRN
  Administered 2013-08-07: 4 mL

## 2013-08-07 MED ORDER — ONDANSETRON HCL 4 MG/2ML IJ SOLN
INTRAMUSCULAR | Status: DC | PRN
  Administered 2013-08-07: 4 mg via INTRAVENOUS

## 2013-08-07 MED ORDER — OXYCODONE HCL 5 MG PO TABS
5.0000 mg | ORAL_TABLET | Freq: Once | ORAL | Status: AC | PRN
  Administered 2013-08-07: 5 mg via ORAL
  Filled 2013-08-07: qty 1

## 2013-08-07 SURGICAL SUPPLY — 42 items
APL SKNCLS STERI-STRIP NONHPOA (GAUZE/BANDAGES/DRESSINGS) ×1
BENZOIN TINCTURE PRP APPL 2/3 (GAUZE/BANDAGES/DRESSINGS) ×2 IMPLANT
BLADE SURG 15 STRL LF DISP TIS (BLADE) ×1 IMPLANT
BLADE SURG 15 STRL SS (BLADE) ×2
CHLORAPREP W/TINT 26ML (MISCELLANEOUS) ×2 IMPLANT
CLEANER CAUTERY TIP 5X5 PAD (MISCELLANEOUS) ×1 IMPLANT
COVER MAYO STAND STRL (DRAPES) ×2 IMPLANT
COVER TABLE BACK 60X90 (DRAPES) ×2 IMPLANT
DECANTER SPIKE VIAL GLASS SM (MISCELLANEOUS) ×2 IMPLANT
DRAPE PED LAPAROTOMY (DRAPES) ×2 IMPLANT
DRAPE UTILITY XL STRL (DRAPES) IMPLANT
DRSG TEGADERM 2-3/8X2-3/4 SM (GAUZE/BANDAGES/DRESSINGS) ×1 IMPLANT
DRSG TEGADERM 4X4.75 (GAUZE/BANDAGES/DRESSINGS) ×1 IMPLANT
DRSG TELFA 3X8 NADH (GAUZE/BANDAGES/DRESSINGS) ×2 IMPLANT
ELECT REM PT RETURN 9FT ADLT (ELECTROSURGICAL) ×2
ELECTRODE REM PT RTRN 9FT ADLT (ELECTROSURGICAL) ×1 IMPLANT
GLOVE BIOGEL PI IND STRL 7.5 (GLOVE) IMPLANT
GLOVE BIOGEL PI IND STRL 8.5 (GLOVE) ×1 IMPLANT
GLOVE BIOGEL PI INDICATOR 7.5 (GLOVE) ×2
GLOVE BIOGEL PI INDICATOR 8.5 (GLOVE) ×1
GLOVE ECLIPSE 6.5 STRL STRAW (GLOVE) ×1 IMPLANT
GLOVE ECLIPSE 7.0 STRL STRAW (GLOVE) ×1 IMPLANT
GLOVE ECLIPSE 8.0 STRL XLNG CF (GLOVE) ×2 IMPLANT
GOWN STRL REUS W/ TWL LRG LVL3 (GOWN DISPOSABLE) ×1 IMPLANT
GOWN STRL REUS W/TWL LRG LVL3 (GOWN DISPOSABLE) ×6
NDL HYPO 25X1 1.5 SAFETY (NEEDLE) ×1 IMPLANT
NDL SAFETY ECLIPSE 18X1.5 (NEEDLE) IMPLANT
NEEDLE HYPO 18GX1.5 SHARP (NEEDLE) ×2
NEEDLE HYPO 25X1 1.5 SAFETY (NEEDLE) ×2 IMPLANT
PACK BASIN DAY SURGERY FS (CUSTOM PROCEDURE TRAY) ×2 IMPLANT
PAD CLEANER CAUTERY TIP 5X5 (MISCELLANEOUS) ×1
PAD DRESSING TELFA 3X8 NADH (GAUZE/BANDAGES/DRESSINGS) ×1 IMPLANT
PENCIL BUTTON HOLSTER BLD 10FT (ELECTRODE) ×2 IMPLANT
SLEEVE SCD COMPRESS KNEE MED (MISCELLANEOUS) IMPLANT
SPONGE GAUZE 2X2 8PLY STRL LF (GAUZE/BANDAGES/DRESSINGS) ×2 IMPLANT
STRIP CLOSURE SKIN 1/2X4 (GAUZE/BANDAGES/DRESSINGS) ×2 IMPLANT
SUT MON AB 4-0 PC3 18 (SUTURE) ×2 IMPLANT
SUT VIC AB 4-0 SH 27 (SUTURE) ×2
SUT VIC AB 4-0 SH 27XANBCTRL (SUTURE) ×1 IMPLANT
SYR CONTROL 10ML LL (SYRINGE) ×2 IMPLANT
TOWEL OR 17X24 6PK STRL BLUE (TOWEL DISPOSABLE) ×4 IMPLANT
TOWEL OR NON WOVEN STRL DISP B (DISPOSABLE) ×2 IMPLANT

## 2013-08-07 NOTE — Transfer of Care (Signed)
Immediate Anesthesia Transfer of Care Note  Patient: Nancy Beard  Procedure(s) Performed: Procedure(s): REMOVAL PORT-A-CATH (Right)  Patient Location: PACU  Anesthesia Type:MAC  Level of Consciousness: awake, alert , oriented and patient cooperative  Airway & Oxygen Therapy: Patient Spontanous Breathing and Patient connected to face mask oxygen  Post-op Assessment: Report given to PACU RN and Post -op Vital signs reviewed and stable  Post vital signs: Reviewed and stable  Complications: No apparent anesthesia complications

## 2013-08-07 NOTE — Anesthesia Preprocedure Evaluation (Signed)
Anesthesia Evaluation  Patient identified by MRN, date of birth, ID band Patient awake    Reviewed: Allergy & Precautions, H&P , NPO status , Patient's Chart, lab work & pertinent test results  Airway Mallampati: II TM Distance: >3 FB Neck ROM: Full    Dental no notable dental hx. (+) Teeth Intact and Dental Advisory Given   Pulmonary neg pulmonary ROS,  breath sounds clear to auscultation  Pulmonary exam normal       Cardiovascular negative cardio ROS  Rhythm:Regular Rate:Normal     Neuro/Psych PSYCHIATRIC DISORDERS negative neurological ROS     GI/Hepatic negative GI ROS, Neg liver ROS,   Endo/Other  Hypothyroidism   Renal/GU negative Renal ROS  negative genitourinary   Musculoskeletal   Abdominal   Peds  Hematology negative hematology ROS (+)   Anesthesia Other Findings   Reproductive/Obstetrics negative OB ROS                           Anesthesia Physical Anesthesia Plan  ASA: II  Anesthesia Plan: MAC   Post-op Pain Management:    Induction: Intravenous  Airway Management Planned: Simple Face Mask  Additional Equipment:   Intra-op Plan:   Post-operative Plan:   Informed Consent: I have reviewed the patients History and Physical, chart, labs and discussed the procedure including the risks, benefits and alternatives for the proposed anesthesia with the patient or authorized representative who has indicated his/her understanding and acceptance.   Dental advisory given  Plan Discussed with: CRNA  Anesthesia Plan Comments:         Anesthesia Quick Evaluation

## 2013-08-07 NOTE — Anesthesia Postprocedure Evaluation (Signed)
  Anesthesia Post-op Note  Patient: Nancy Beard  Procedure(s) Performed: Procedure(s): REMOVAL PORT-A-CATH (Right)  Patient Location: PACU  Anesthesia Type: MAC  Level of Consciousness: awake and alert   Airway and Oxygen Therapy: Patient Spontanous Breathing  Post-op Pain: none  Post-op Assessment: Post-op Vital signs reviewed, Patient's Cardiovascular Status Stable and Respiratory Function Stable  Post-op Vital Signs: Reviewed  Filed Vitals:   08/07/13 0828  BP: 121/70  Pulse: 76  Temp:   Resp: 18    Complications: No apparent anesthesia complications

## 2013-08-07 NOTE — Op Note (Addendum)
PREOPERATIVE DIAGNOSIS:  Retain Port-a-cath.  POSTOPERATIVE DIAGNOSIS:  Same  PROCEDURE:    Porta-cath removal  SURGEON:  Jackolyn Confer, M.D.  ANESTHESIA:  Local with MAC  INDICATION:  This is a 58 year old female with breast cancer who has received chemotherapy via the Porta-cath.  The Porta-cath is no longer needed.  She now presents for removal.  The procedure, risks, and aftercare have been explained preoperatively.   The right upper chest wall and neck were sterilely prepped and draped. Local anesthetic was infiltrated at the site of the port in the chest wall superficially and deep. The previous scar was incised sharply and then using electrocautery the subcutaneous tissue was divided until the port and catheter were identified.  The fibrous sheath was dissected free from the catheter and it was pulled out of the internal jugular vein. Direct pressure was held over the vein site for 10-15 minutes.  The port was then dissected free from the chest wall using electrocautery. The port and catheter were removed intact. The chest wall area was inspected and bleeding controlled with electrocautery. Once hemostasis was adequate, the chest wall incision was closed in 2 layers. The subcutaneous tissues approximated with running 3-0 Vicryl suture. The skin was closed with a running 4-0 Monocryl subcuticular stitch. Steri-Strips and a sterile dressing were applied.  She tolerated the procedure well without any apparent complications and was taken to the recovery room in satisfactory condition.

## 2013-08-07 NOTE — H&P (View-Only) (Signed)
Subjective:     Patient ID: Nancy Beard, female   DOB: 10/22/1955, 57 y.o.   MRN: [id number]  HPI She is here today to discuss her Port-A-Cath removal. She is status post bilateral mastectomies and right axillary sentinel lymph node biopsy for right breast cancer. Since I saw her last, she's been diagnosed with a right leg DVT. She's had breast reconstruction.  She is taking Xarelto.  She is use Lovenox in the past to bridge her anticoagulation for surgery.   Review of Systems  No chest wall masses. No axillary adenopathy the     Objective:   Physical Exam Gen.-she looks well and is in no acute distress. Chest wall-bilateral scars. No chest wall nodularity. Expanders in place. Lymph nodes-no palpable supraclavicular or axillary adenopathy. Right axillary scar.    Assessment:     1. Right breast cancer-no clinical evidence of recurrence.  2. Indwelling Port-A-Cath  3. Anticoagulated state for DVT     Plan:     Port-A-Cath removal. Will need to bridge her anticoagulation with Lovenox and we have discussed this. I have given her instructions on how to do this. She has Lovenox.   We discussed the procedure and the risks. Risks include but are not limited to bleeding, infection, wound healing, anesthesia.      

## 2013-08-07 NOTE — Anesthesia Procedure Notes (Signed)
Procedure Name: MAC Date/Time: 08/07/2013 7:36 AM Performed by: Cobi Aldape Pre-anesthesia Checklist: Patient identified, Emergency Drugs available, Suction available, Patient being monitored and Timeout performed Patient Re-evaluated:Patient Re-evaluated prior to inductionOxygen Delivery Method: Simple face mask

## 2013-08-07 NOTE — Interval H&P Note (Signed)
History and Physical Interval Note:  08/07/2013 7:13 AM  Nancy Beard  has presented today for surgery, with the diagnosis of BREAST CANCER   The various methods of treatment have been discussed with the patient and family. After consideration of risks, benefits and other options for treatment, the patient has consented to  Procedure(s): REMOVAL PORT-A-CATH (N/A) as a surgical intervention .  The patient's history has been reviewed, patient examined, no change in status, stable for surgery.  I have reviewed the patient's chart and labs.  Questions were answered to the patient's satisfaction.     Rashidi Loh Lenna Sciara

## 2013-08-07 NOTE — Discharge Instructions (Addendum)
°  Post Anesthesia Home Care Instructions  Activity: Get plenty of rest for the remainder of the day. A responsible adult should stay with you for 24 hours following the procedure.  For the next 24 hours, DO NOT: -Drive a car -Paediatric nurse -Drink alcoholic beverages -Take any medication unless instructed by your physician -Make any legal decisions or sign important papers.  Meals: Start with liquid foods such as gelatin or soup. Progress to regular foods as tolerated. Avoid greasy, spicy, heavy foods. If nausea and/or vomiting occur, drink only clear liquids until the nausea and/or vomiting subsides. Call your physician if vomiting continues.  Special Instructions/Symptoms: Your throat may feel dry or sore from the anesthesia or the breathing tube placed in your throat during surgery. If this causes discomfort, gargle with warm salt water. The discomfort should disappear within 24 hours.      Light activity this weekend.  Sit upright for at least 6 hours when you get home.  May shower tomorrow.  Remove bandage in 3 days.  Leave steri strips on until they fall off.  Call for heavy bleeding or wound problems.  Office visit in 3 weeks for wound check.  Call [phone] to make appointment.

## 2013-08-10 ENCOUNTER — Telehealth: Payer: Self-pay | Admitting: Oncology

## 2013-08-10 ENCOUNTER — Encounter (HOSPITAL_BASED_OUTPATIENT_CLINIC_OR_DEPARTMENT_OTHER): Payer: Self-pay | Admitting: General Surgery

## 2013-08-10 NOTE — Telephone Encounter (Signed)
, °

## 2013-08-12 ENCOUNTER — Telehealth: Payer: Self-pay | Admitting: Oncology

## 2013-08-12 ENCOUNTER — Ambulatory Visit (HOSPITAL_BASED_OUTPATIENT_CLINIC_OR_DEPARTMENT_OTHER): Payer: Private Health Insurance - Indemnity | Admitting: Hematology and Oncology

## 2013-08-12 ENCOUNTER — Other Ambulatory Visit (HOSPITAL_BASED_OUTPATIENT_CLINIC_OR_DEPARTMENT_OTHER): Payer: Private Health Insurance - Indemnity

## 2013-08-12 VITALS — BP 129/80 | HR 94 | Temp 98.0°F | Resp 18 | Ht 66.0 in | Wt 149.1 lb

## 2013-08-12 DIAGNOSIS — R7989 Other specified abnormal findings of blood chemistry: Secondary | ICD-10-CM

## 2013-08-12 DIAGNOSIS — C50919 Malignant neoplasm of unspecified site of unspecified female breast: Secondary | ICD-10-CM

## 2013-08-12 DIAGNOSIS — G5690 Unspecified mononeuropathy of unspecified upper limb: Secondary | ICD-10-CM

## 2013-08-12 DIAGNOSIS — I82409 Acute embolism and thrombosis of unspecified deep veins of unspecified lower extremity: Secondary | ICD-10-CM

## 2013-08-12 DIAGNOSIS — C50319 Malignant neoplasm of lower-inner quadrant of unspecified female breast: Secondary | ICD-10-CM

## 2013-08-12 DIAGNOSIS — M810 Age-related osteoporosis without current pathological fracture: Secondary | ICD-10-CM

## 2013-08-12 DIAGNOSIS — C50311 Malignant neoplasm of lower-inner quadrant of right female breast: Secondary | ICD-10-CM

## 2013-08-12 LAB — CBC WITH DIFFERENTIAL/PLATELET
BASO%: 0.4 % (ref 0.0–2.0)
Basophils Absolute: 0 10*3/uL (ref 0.0–0.1)
EOS%: 2.9 % (ref 0.0–7.0)
Eosinophils Absolute: 0.1 10*3/uL (ref 0.0–0.5)
HCT: 40.7 % (ref 34.8–46.6)
HGB: 13.5 g/dL (ref 11.6–15.9)
LYMPH%: 40.7 % (ref 14.0–49.7)
MCH: 28.5 pg (ref 25.1–34.0)
MCHC: 33.2 g/dL (ref 31.5–36.0)
MCV: 85.8 fL (ref 79.5–101.0)
MONO#: 0.5 10*3/uL (ref 0.1–0.9)
MONO%: 10.7 % (ref 0.0–14.0)
NEUT#: 2.2 10*3/uL (ref 1.5–6.5)
NEUT%: 45.3 % (ref 38.4–76.8)
Platelets: 218 10*3/uL (ref 145–400)
RBC: 4.74 10*6/uL (ref 3.70–5.45)
RDW: 15.4 % — ABNORMAL HIGH (ref 11.2–14.5)
WBC: 4.8 10*3/uL (ref 3.9–10.3)
lymph#: 1.9 10*3/uL (ref 0.9–3.3)

## 2013-08-12 LAB — COMPREHENSIVE METABOLIC PANEL (CC13)
ALT: 66 U/L — ABNORMAL HIGH (ref 0–55)
AST: 64 U/L — ABNORMAL HIGH (ref 5–34)
Albumin: 3.9 g/dL (ref 3.5–5.0)
Alkaline Phosphatase: 178 U/L — ABNORMAL HIGH (ref 40–150)
Anion Gap: 11 mEq/L (ref 3–11)
BUN: 13.8 mg/dL (ref 7.0–26.0)
CO2: 26 mEq/L (ref 22–29)
Calcium: 9.4 mg/dL (ref 8.4–10.4)
Chloride: 107 mEq/L (ref 98–109)
Creatinine: 0.7 mg/dL (ref 0.6–1.1)
Glucose: 120 mg/dl (ref 70–140)
Potassium: 3.8 mEq/L (ref 3.5–5.1)
Sodium: 143 mEq/L (ref 136–145)
Total Bilirubin: 0.43 mg/dL (ref 0.20–1.20)
Total Protein: 7 g/dL (ref 6.4–8.3)

## 2013-08-12 MED ORDER — GABAPENTIN 100 MG PO CAPS: 200.0000 mg | ORAL_CAPSULE | Freq: Three times a day (TID) | ORAL | Status: DC

## 2013-08-12 NOTE — Telephone Encounter (Signed)
, °

## 2013-08-12 NOTE — Progress Notes (Signed)
OFFICE PROGRESS NOTE  CC  Georgetta Haber, MD [address] [place] [address] Dr. Thea Silversmith  Dr. Jackolyn Confer  DIAGNOSIS: 58 year old female with new diagnosis of stage II Beard breast cancer that is triple negative    STAGE:  Cancer of lower-inner quadrant of female breast  Primary site: Breast (Right)  Staging method: AJCC 7th Edition  Clinical: Stage IIA (T2, N0, cM0)  Summary: Stage IIA (T2, N0, cM0)  CURRENT THERAPY: Anastrozole started in 05/19/2013  Chief complaint: Breast cancer followup visit  PRIOR THERAPY: #1Patient's last mammogram was about 5 years ago. Recently she underwent Beard screening mammogram that showed Beard spiculated mass in the lower inner quadrant of the right breast. She had ultrasound performed that showed irregular mass at the 5:00 position 3 cm from the nipple measuring 1.1 cm. She had Beard needle core biopsy performed on 06/19/2012 the biopsy showed invasive ductal carcinoma with ductal carcinoma in situ grade 3 tumor was ER negative PR negative HER-2/neu negative. Ki-67 was elevated at 34%. She went on to have MRI of the breasts performed on 06/24/2012 the MRI showed in the middle third of the lower inner quadrant of the right breast Beard 1.5 x 1.3 x 1.9 cm irregular enhancing mass. No abnormal enhancement was seen in the left breast no enlarged axillary or internal mammary adenopathy was detected.   #2 patient is now status post bilateral mastectomies. On her right mastectomy she was found to have Beard 2.3 cm invasive ductal carcinoma 0/1 lymph nodes were positive for metastatic disease and this is T2 N0) stage II. Tumor was grade 3 ER 2% PR negative HER-2/neu negative with Beard Ki-67 at 34%. Left mastectomy only showed intraductal papilloma node atypia or malignancy was found.  #3 patient underwent adjuvant chemotherapy since her tumor is essentially triple negative and is Beard stage II. We discussed the rationale for adjuvant chemotherapy. She received  Adriamycin Cytoxan every 2 weeks for Beard total of 4 cycles with G-CSF support. After this she completed Taxol and carboplatinum every week for Beard total of 12 weeks. I did discuss with her use of antiestrogen therapy such as an aromatase inhibitor since her tumor is 2% ER positive. I discussed the rationale for this.  #4 Patient was started on Arimidex on 05/19/13, she does have osteoporosis on her bone density from 04/08/13 and is taking Fosamax every week.     INTERVAL HISTORY: Nancy Beard 58 y.o. female returns for follow up visit today. Since the time of last visit she underwent wide local excision of the perineum for  VIN3 on 07/14/2013. Final pathology revealed CIN-2 with negative margins. Her chemoport was also removed and She is doing well today.  She says that she had bilateral silicon implants placed. She says that she'll be completing her final  reconstructive surgery in the month of March, 2015.    She is taking the Arimidex daily. She complains of intermittent hot flashes and she says those are tolerable. She denies any joint pains. She does complain of tingling and numbness in the toes and fingers and her pain gets better with gabapentin.   she denies any shortness of breath, chest pain, palpitations, headaches, blurred vision, fevers, loss of weight, loss of appetite.   She is at present on xarelto for right lower extremity DVT    MEDICAL HISTORY: Past Medical History  Diagnosis Date  . Thyroid disease   . Depression   . Sprue   . Breast cancer  right  . Hypothyroidism   . DVT (deep venous thrombosis) 2014    Right lower extremity, thigh  . Wears glasses   . Pneumonia     hx of years ago  . History of chemotherapy   . Neuropathy     of fingers and toes    ALLERGIES:  is allergic to ciprofloxacin and gluten meal.  MEDICATIONS:  Current Outpatient Prescriptions  Medication Sig Dispense Refill  . alendronate (FOSAMAX) 70 MG tablet Take 1 tablet (70 mg total) by mouth  once Beard week. Take with Beard full glass of water on an empty stomach.  12 tablet  0  . anastrozole (ARIMIDEX) 1 MG tablet Take 1 tablet (1 mg total) by mouth daily.  90 tablet  0  . B Complex-C (SUPER B COMPLEX PO) Take 1 tablet by mouth daily.      [name] gabapentin (NEURONTIN) 100 MG capsule Take 200 mg by mouth 2 (two) times daily.      [name] levothyroxine (SYNTHROID, LEVOTHROID) 25 MCG tablet Take 25 mcg by mouth daily.      [name] lidocaine-prilocaine (EMLA) cream       . liothyronine (CYTOMEL) 25 MCG tablet Take 12.5 mcg by mouth daily.      . Multiple Vitamin (MULTIVITAMIN) tablet Take 1 tablet by mouth daily.      . Rivaroxaban (XARELTO PO) Take 1 tablet by mouth every morning.      . venlafaxine XR (EFFEXOR-XR) 75 MG 24 hr capsule Take 75 mg by mouth daily with breakfast.      . HYDROcodone-acetaminophen (NORCO/VICODIN) 5-325 MG per tablet Take 1-2 tablets by mouth every 4 (four) hours as needed.  20 tablet  0  . prochlorperazine (COMPAZINE) 10 MG tablet Take 10 mg by mouth every 6 (six) hours as needed for nausea.       No current facility-administered medications for this visit.    SURGICAL HISTORY:  Past Surgical History  Procedure Laterality Date  . Esophagogastroduodenoscopy  2007    sprue  . Colonoscopy  2006  . Cryoblation of cervix      long time ago  . Breast lumpectomy    . Total mastectomy Bilateral 09/11/2012    Procedure: bilateral MASTECTOMY;  Surgeon: Odis Hollingshead, MD;  Location: [facility];  Service: General;  Laterality: Bilateral;  . Axillary sentinel node biopsy Right 09/11/2012    Procedure: AXILLARY SENTINEL lymph NODE  BIOPSY;  Surgeon: Odis Hollingshead, MD;  Location: [facility];  Service: General;  Laterality: Right;  right nuclear medicine injection 12:30   . Breast reconstruction with placement of tissue expander and flex hd (acellular hydrated dermis) Bilateral 09/11/2012    Procedure: BREAST RECONSTRUCTION WITH PLACEMENT OF TISSUE EXPANDER AND FLEX HD (ACELLULAR HYDRATED  DERMIS) ADM;  Surgeon: Theodoro Kos, DO;  Location: [facility];  Service: Plastics;  Laterality: Bilateral;  . Incision and drainage of wound Left 09/16/2012    Procedure: Left Breast Evacuation of Hematoma;  Surgeon: Theodoro Kos, DO;  Location: [facility];  Service: Plastics;  Laterality: Left;  . Portacath placement Right 10/09/2012    Procedure: US GUIDED INSERTION PORT-Beard-CATH;  Surgeon: Odis Hollingshead, MD;  Location: [facility];  Service: General;  Laterality: Right;  Right Subclavian Vein  . Removal of bilateral tissue expanders with placement of bilateral breast implants Bilateral 06/24/2013    Procedure: REMOVAL OF BILATERAL TISSUE EXPANDERS WITH PLACEMENT OF BILATERAL BREAST IMPLANTS;  Surgeon: Theodoro Kos, DO;  Location: [facility];  Service: Clinical cytogeneticist;  Laterality: Bilateral;  . Vulvectomy N/Beard 07/14/2013    Procedure: WIDE LOCAL  EXCISION VULVAR;  Surgeon: Alvino Chapel, MD;  Location: [facility];  Service: Gynecology;  Laterality: N/Beard;  . Port-Beard-cath removal Right 08/07/2013    Procedure: REMOVAL PORT-Beard-CATH;  Surgeon: Odis Hollingshead, MD;  Location: [facility];  Service: General;  Laterality: Right;    REVIEW OF SYSTEMS:   Beard 14 point review of systems was conducted and is otherwise negative except for what is noted above.      PHYSICAL EXAMINATION: Last menstrual period 09/11/2012. There is no weight on file to calculate BMI. General: Patient is Beard well appearing female in no acute distress HEENT: PERRLA, sclerae anicteric no conjunctival pallor, MMM Neck: supple, no palpable adenopathy Lungs: clear to auscultation bilaterally, no wheezes, rhonchi, or rales Cardiovascular: regular rate rhythm, S1, S2, no murmurs, rubs or gallops Abdomen: Soft, non-tender, non-distended, normoactive bowel sounds, no HSM Extremities: warm and well perfused, no clubbing, cyanosis, or edema Skin: No rashes or lesions, scalp erythema appears to be Beard  birthmark Neuro: Non-focal Breast examination: Bilateral breast implants noted. No bilateral axillary lymphadenopathy appreciated  ECOG PERFORMANCE STATUS: 1 - Symptomatic but completely ambulatory  LABORATORY DATA: Lab Results  Component Value Date   WBC 4.8 08/12/2013   HGB 13.5 08/12/2013   HCT 40.7 08/12/2013   MCV 85.8 08/12/2013   PLT 218 08/12/2013      Chemistry      Component Value Date/Time   NA 142 08/05/2013 1206   NA 140 07/06/2013 0803   K 3.8 08/05/2013 1206   K 4.0 07/06/2013 0803   CL 102 08/05/2013 1206   CL 105 01/06/2013 0827   CO2 25 08/05/2013 1206   CO2 25 07/06/2013 0803   BUN 11 08/05/2013 1206   BUN 12.5 07/06/2013 0803   CREATININE 0.54 08/05/2013 1206   CREATININE 0.6 07/06/2013 0803      Component Value Date/Time   CALCIUM 9.2 08/05/2013 1206   CALCIUM 9.0 07/06/2013 0803   ALKPHOS 181* 08/05/2013 1206   ALKPHOS 255* 07/06/2013 0803   AST 39* 08/05/2013 1206   AST 104* 07/06/2013 0803   ALT 39* 08/05/2013 1206   ALT 109* 07/06/2013 0803   BILITOT 0.4 08/05/2013 1206   BILITOT 0.26 07/06/2013 0803     ADDITIONAL INFORMATION: 3. CHROMOGENIC IN-SITU HYBRIDIZATION Interpretation HER-2/NEU BY CISH - NO AMPLIFICATION OF HER-2 DETECTED. THE RATIO OF HER-2: CEP 17 SIGNALS WAS 1.52. Reference range: Ratio: HER2:CEP17 < 1.8 - gene amplification not observed Ratio: HER2:CEP 17 1.8-2.2 - equivocal result Ratio: HER2:CEP17 > 2.2 - gene amplification observed Enid Cutter MD Pathologist, Electronic Signature ( Signed 09/22/2012) 3. PROGNOSTIC INDICATORS - ACIS Results IMMUNOHISTOCHEMICAL AND MORPHOMETRIC ANALYSIS BY THE AUTOMATED CELLULAR IMAGING SYSTEM (ACIS) Estrogen Receptor (Negative, <1%): 2%, POSITIVE, WEAK STAINING INTENSITY Progesterone Receptor (Negative, <1%): 0%, NEGATIVE COMMENT: The negative hormone receptor study in this case has an internal positive control. All controls stained appropriately Enid Cutter MD Pathologist, Electronic  Signature ( Signed 09/22/2012) 1 of 4 FINAL for Nancy Beard, Nancy Beard [phone]) FINAL DIAGNOSIS Diagnosis 1. Lymph node, sentinel, biopsy, Right axilla - ONE LYMPH NODE, NEGATIVE FOR TUMOR (0/1). 2. Breast, simple mastectomy, Left - BENIGN BREAST TISSUE WITH INTRADUCTAL PAPILLOMA, SEE COMMENT. - NEGATIVE FOR ATYPIA OR MALIGNANCY. - SURGICAL MARGINS, NEGATIVE FOR ATYPIA OR MALIGNANCY. - MICROCALCIFICATIONS IDENTIFIED. 3. Breast, simple mastectomy, Right - INVASIVE DUCTAL CARCINOMA, GRADE III, WITH SPINDLE CELL DIFFERENTIATION (METAPLASTIC CARCINOMA) (  2.3CM), SEE COMMENT. - NO LYMPHOVASCULAR INVASION IDENTIFIED. - INVASIVE TUMOR IS 1.5 CM FROM NEAREST MARGIN (DEEP). - DUCTAL CARCINOMA IN SITU, GRADE III, WITH COMEDONECROSIS. - SEE TUMOR SYNOPTIC TEMPLATE BELOW. Microscopic Comment 2. Although there was no mass grossly identified, there are definitive morphologic features of intraductal papilloma with associated fibrocystic change present. In addition, there are fibrocystic changes with usual ductal hyperplasia in representative sections not involved by papilloma. Finally, foci of sclerosing adenosis and microcalcifications in benign ducts and lobules are present. The surgical resection margin(s) of the specimen were inked and microscopically evaluated. 3. BREAST, INVASIVE TUMOR, WITH LYMPH NODE SAMPLING Specimen, including laterality: Right breast. Procedure: Simple mastectomy. Grade: III of III. Tubule formation: 3. Nuclear pleomorphism: 3. Mitotic: 2. Tumor size (gross measurement): 2.3 cm. Margins: Invasive, distance to closest margin: 1.5 cm. In-situ, distance to closest margin: 1.5 cm (deep). If margin positive, focally or broadly: N/Beard. Lymphovascular invasion: Absent. Ductal carcinoma in situ: Present. Grade: III of III. Extensive intraductal component: Absent. Lobular neoplasia: Absent. Tumor focality: Unifocal. Treatment effect: None. If present, treatment effect in  breast tissue, lymph nodes or both: N/Beard. Extent of tumor: Skin and Nipple: Grossly negative. Skeletal muscle: N/Beard. Lymph nodes: # examined: 1. Lymph nodes with metastasis: 0. Breast prognostic profile: Estrogen receptor: Repeated; previous study demonstrates 0% positivity ([id number]). Progesterone receptor: Repeated; previous study demonstrates 0% positivity ([id number]). HER-2/neu: Repeated; previous study demonstrated no amplification (1.55) ([id number]). 2 of 4 FINAL for Nancy Beard, Nancy Beard [phone]) Microscopic Comment(continued) Ki-67: Not repeated; previous study demonstrates 34% proliferation rate [id number]). Non-neoplastic breast: Intraductal papilloma with fibrocystic change and usual ductal hyperplasia, fibrocystic change with usual ductal hyperplasia, microcalcifications, and previous biopsy site. TNM: pT2, pN0, pMX. Comments: Although there are definitive features of high grade invasive ductal carcinoma identified, there are multiple foci where the carcinoma assumes Beard spindle cell morphology / spindle cell differentiation. 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 There are no heterologous elements identified. (CRR:eps 09/15/12)  RADIOGRAPHIC STUDIES:  Chest 2 View  09/08/2012  *RADIOLOGY REPORT*  Clinical Data: Right-sided breast  CHEST - 2 VIEW  Comparison: Chest x-ray 12/06/2009.  Findings: Lung volumes are normal.  No consolidative airspace disease.  No pleural effusions.  No pneumothorax.  No pulmonary nodule or mass noted.  Pulmonary vasculature and the cardiomediastinal silhouette are within normal limits.  Mild bilateral apical pleuroparenchymal thickening most compatible with chronic scarring, unchanged compared to the prior examination.  IMPRESSION: 1. No radiographic evidence of acute cardiopulmonary disease.   Original Report Authenticated By: Vinnie Langton, M.D.    Nm Sentinel  Node Inj-no Rpt (breast)  09/11/2012  CLINICAL DATA: Invasive right breast cancer   Sulfur colloid was injected intradermally by the nuclear medicine  technologist for breast cancer sentinel node localization.      ASSESSMENT: 58 year old female with  #1 invasive ductal carcinoma of the right breast status post bilateral mastectomies for Beard 2.3 cm node negative essentially triple negative disease on the right. Without any evidence of malignancy on the left. Patient underwent 4 cycles of Adriamycin/Cytoxan followed by 12 cycles of Taxol/Carboplatin.  Since her tumor was 2% estrogen receptor positive we will utilize an aromatase inhibitor for 5 years.  She started Arimidex on 05/19/13.    #2 patient is also having considerable anxiety and will continue to take Effexor daily.    #3 Right lower extremity DVT: on  Xarelto   #4Neuropathy: Gabapentin and Super B complex.    #  5 Osteoporosis: On Fosamax every week.  Last Bone density was on 04/08/13.  She also takes Calcium, Vitamin D, and does weight bearing exercises.   #6 Elevated liver functions: Those were normalized in  on 08/05/2013 and slightly elevated now with AST of 64 and ALT of 66. Her alkaline phosphatase is also improved from on 255 on 07/06/2013 to 178 now. We'll closely monitor the liver functions.  #7 Wide local excision of the perineum for  VIN3 on 07/14/2013. Final pathology revealed CIN-2 with negative margins.    PLAN:   1.  Ms. Jaynee Eagles  is tolerating Arimidex well.  continue Arimidex as scheduled   2.   Continuing Fosamax weekly  with daily calcium and vitamin D   3.   Follow up with plastic surgery as scheduled   4.   Continue Xarelto for now. We'll repeat venous Doppler of the right lower extremity prior to next visit. If her venous Doppler is negative and  no prothrombotic condition, her anticoagulation can be discontinued   5. Next followup visit in 3 months. CBC and differential and CMP prior to next visit  All  questions were answered. The patient knows to call the clinic with any problems, questions or concerns. We can certainly see the patient much sooner if necessary.  I spent 20 minutes counseling the patient face to face. The total time spent in the appointment was 30 minutes.   Wilmon Arms, M.D.  [place] [phone] 08/12/2013, 10:04 AM

## 2013-08-13 ENCOUNTER — Other Ambulatory Visit: Payer: Private Health Insurance - Indemnity

## 2013-08-13 ENCOUNTER — Ambulatory Visit: Payer: Private Health Insurance - Indemnity | Admitting: Adult Health

## 2013-08-13 ENCOUNTER — Ambulatory Visit: Payer: Private Health Insurance - Indemnity

## 2013-08-19 ENCOUNTER — Other Ambulatory Visit: Payer: Self-pay | Admitting: Internal Medicine

## 2013-08-19 NOTE — Telephone Encounter (Signed)
Pt has one pill left

## 2013-09-02 ENCOUNTER — Ambulatory Visit: Payer: Private Health Insurance - Indemnity | Admitting: Internal Medicine

## 2013-09-09 ENCOUNTER — Encounter (INDEPENDENT_AMBULATORY_CARE_PROVIDER_SITE_OTHER): Payer: Private Health Insurance - Indemnity | Admitting: General Surgery

## 2013-09-09 ENCOUNTER — Ambulatory Visit: Payer: Private Health Insurance - Indemnity | Admitting: Internal Medicine

## 2013-09-15 ENCOUNTER — Encounter: Payer: Self-pay | Admitting: Gynecology

## 2013-09-15 ENCOUNTER — Ambulatory Visit: Payer: Private Health Insurance - Indemnity | Attending: Gynecology | Admitting: Gynecology

## 2013-09-15 VITALS — BP 119/80 | HR 96 | Temp 98.3°F | Ht 66.0 in | Wt 150.0 lb

## 2013-09-15 DIAGNOSIS — F3289 Other specified depressive episodes: Secondary | ICD-10-CM | POA: Insufficient documentation

## 2013-09-15 DIAGNOSIS — C50919 Malignant neoplasm of unspecified site of unspecified female breast: Secondary | ICD-10-CM | POA: Insufficient documentation

## 2013-09-15 DIAGNOSIS — Z9010 Acquired absence of unspecified breast and nipple: Secondary | ICD-10-CM | POA: Insufficient documentation

## 2013-09-15 DIAGNOSIS — E039 Hypothyroidism, unspecified: Secondary | ICD-10-CM | POA: Insufficient documentation

## 2013-09-15 DIAGNOSIS — Z9221 Personal history of antineoplastic chemotherapy: Secondary | ICD-10-CM | POA: Insufficient documentation

## 2013-09-15 DIAGNOSIS — Z79899 Other long term (current) drug therapy: Secondary | ICD-10-CM | POA: Insufficient documentation

## 2013-09-15 DIAGNOSIS — F329 Major depressive disorder, single episode, unspecified: Secondary | ICD-10-CM | POA: Insufficient documentation

## 2013-09-15 DIAGNOSIS — G589 Mononeuropathy, unspecified: Secondary | ICD-10-CM | POA: Insufficient documentation

## 2013-09-15 DIAGNOSIS — D071 Carcinoma in situ of vulva: Secondary | ICD-10-CM | POA: Insufficient documentation

## 2013-09-15 DIAGNOSIS — Z86718 Personal history of other venous thrombosis and embolism: Secondary | ICD-10-CM | POA: Insufficient documentation

## 2013-09-15 NOTE — Progress Notes (Signed)
Consult Note: Gyn-Onc   Nancy Beard 58 y.o. female  Chief Complaint  Patient presents with  . Vulvar intraepithelial neoplasia III    Assessment :  VIN 3 status post wide local excision on 07/14/2013. Good postoperative recovery.  Plan: Patient returned to the care of Dr. Freda Munro. She's given information regarding signs and symptoms of possible recurrent VIN.  Interval History: The patient underwent a wide local excision of the perineum for treatment of VIN 3 on 07/14/2013. Final pathology showed CIN-2 with negative margins. Patient has had an uncomplicated postoperative course. Since her last visit she's done well. All vulvar pain has resolved she. She denies any other symptoms. Overall her functional status is excellent.  HPI: On routine examination the patient was found to have a lesion on the perineum. Biopsy showed VIN 3. She has not had a history of abnormal Pap smears.  Review of Systems:10 point review of systems is negative except as noted in interval history.   Vitals: Blood pressure 119/80, pulse 96, temperature 98.3 F (36.8 C), temperature source Oral, height 5\' 6"  (1.676 m), weight 150 lb (68.04 kg), last menstrual period 09/11/2012.  Physical Exam: General : The patient is a healthy woman in no acute distress.  HEENT: normocephalic, extraoccular movements normal; neck is supple without thyromegally  Lynphnodes: Supraclavicular and inguinal nodes not enlarged  Abdomen: Soft, non-tender, no ascites, no organomegally, no masses, no hernias . There is a less than 1 cm nodular subcutaneous area in the left lower quadrant consistent with prior Lovenox injection. Pelvic:  EGBUS: Normal female the midline perineal incision is well-healed.. No other lesions are noted. Vagina: Normal, no lesions  Urethra and Bladder: Normal, non-tender   Lower extremities: No edema or varicosities. Normal range of motion      Allergies  Allergen Reactions  . Ciprofloxacin  Itching and Other (See Comments)    Patient states she had muscle spasms  . Gluten Meal     Past Medical History  Diagnosis Date  . Thyroid disease   . Depression   . Sprue   . Breast cancer     right  . Hypothyroidism   . DVT (deep venous thrombosis) 2014    Right lower extremity, thigh  . Wears glasses   . Pneumonia     hx of years ago  . History of chemotherapy   . Neuropathy     of fingers and toes    Past Surgical History  Procedure Laterality Date  . Esophagogastroduodenoscopy  2007    sprue  . Colonoscopy  2006  . Cryoblation of cervix      long time ago  . Breast lumpectomy    . Total mastectomy Bilateral 09/11/2012    Procedure: bilateral MASTECTOMY;  Surgeon: Odis Hollingshead, MD;  Location: [facility];  Service: General;  Laterality: Bilateral;  . Axillary sentinel node biopsy Right 09/11/2012    Procedure: AXILLARY SENTINEL lymph NODE  BIOPSY;  Surgeon: Odis Hollingshead, MD;  Location: [facility];  Service: General;  Laterality: Right;  right nuclear medicine injection 12:30   . Breast reconstruction with placement of tissue expander and flex hd (acellular hydrated dermis) Bilateral 09/11/2012    Procedure: BREAST RECONSTRUCTION WITH PLACEMENT OF TISSUE EXPANDER AND FLEX HD (ACELLULAR HYDRATED DERMIS) ADM;  Surgeon: Theodoro Kos, DO;  Location: [facility];  Service: Plastics;  Laterality: Bilateral;  . Incision and drainage of wound Left 09/16/2012    Procedure: Left Breast Evacuation of Hematoma;  Surgeon: Lyndee Leo  Sanger, DO;  Location: [facility];  Service: Clinical cytogeneticist;  Laterality: Left;  . Portacath placement Right 10/09/2012    Procedure: US GUIDED INSERTION PORT-A-CATH;  Surgeon: Odis Hollingshead, MD;  Location: [facility];  Service: General;  Laterality: Right;  Right Subclavian Vein  . Removal of bilateral tissue expanders with placement of bilateral breast implants Bilateral 06/24/2013    Procedure: REMOVAL OF BILATERAL TISSUE EXPANDERS WITH PLACEMENT OF BILATERAL  BREAST IMPLANTS;  Surgeon: Theodoro Kos, DO;  Location: [facility];  Service: Plastics;  Laterality: Bilateral;  . Vulvectomy N/A 07/14/2013    Procedure: WIDE LOCAL  EXCISION VULVAR;  Surgeon: Alvino Chapel, MD;  Location: [facility];  Service: Gynecology;  Laterality: N/A;  . Port-a-cath removal Right 08/07/2013    Procedure: REMOVAL PORT-A-CATH;  Surgeon: Odis Hollingshead, MD;  Location: [facility];  Service: General;  Laterality: Right;    Current Outpatient Prescriptions  Medication Sig Dispense Refill  . alendronate (FOSAMAX) 70 MG tablet Take 1 tablet (70 mg total) by mouth once a week. Take with a full glass of water on an empty stomach.  12 tablet  0  . anastrozole (ARIMIDEX) 1 MG tablet Take 1 tablet (1 mg total) by mouth daily.  90 tablet  0  . B Complex-C (SUPER B COMPLEX PO) Take 1 tablet by mouth daily.      [name] gabapentin (NEURONTIN) 100 MG capsule Take 2 capsules (200 mg total) by mouth 3 (three) times daily.  540 capsule  3  . HYDROcodone-acetaminophen (NORCO/VICODIN) 5-325 MG per tablet Take 1-2 tablets by mouth every 4 (four) hours as needed.  20 tablet  0  . levothyroxine (SYNTHROID, LEVOTHROID) 25 MCG tablet TAKE 1 TABLET EVERY DAY  90 tablet  3  . lidocaine-prilocaine (EMLA) cream       . liothyronine (CYTOMEL) 25 MCG tablet Take 12.5 mcg by mouth daily.      . Multiple Vitamin (MULTIVITAMIN) tablet Take 1 tablet by mouth daily.      . prochlorperazine (COMPAZINE) 10 MG tablet Take 10 mg by mouth every 6 (six) hours as needed for nausea.      . Rivaroxaban (XARELTO PO) Take 1 tablet by mouth every morning.      . valACYclovir (VALTREX) 500 MG tablet       . venlafaxine XR (EFFEXOR-XR) 75 MG 24 hr capsule Take 75 mg by mouth daily with breakfast.       No current facility-administered medications for this visit.    History   Social History  . Marital Status: Married    Spouse Name: N/A    Number of Children: N/A  . Years of  Education: N/A   Occupational History  . Not on file.   Social History Main Topics  . Smoking status: Never Smoker   . Smokeless tobacco: Never Used  . Alcohol Use: No  . Drug Use: No  . Sexual Activity: Yes    Birth Control/ Protection: Post-menopausal   Other Topics Concern  . Not on file   Social History Narrative  . No narrative on file    Family History  Problem Relation Age of Onset  . Breast cancer Paternal Aunt     dx in her 72s  . Cancer Paternal Aunt     breast  . Lymphoma Paternal Uncle   . Breast cancer Paternal Grandmother     dx >50  . Cancer Paternal Grandmother     breast  .  Heart attack Paternal Grandfather   . Breast cancer Other     2 maternal great aunts with breast cancer >50  . Cancer Other     breast      CLARKE-PEARSON,Kristoff Coonradt L, MD 09/15/2013, 2:56 PM

## 2013-09-15 NOTE — Patient Instructions (Signed)
Follow up with your GYN every 6 months. No future appt with Dr. Fermin Schwab. If you have an questions or concerns please call us.

## 2013-09-17 ENCOUNTER — Encounter (HOSPITAL_BASED_OUTPATIENT_CLINIC_OR_DEPARTMENT_OTHER): Payer: Self-pay | Admitting: *Deleted

## 2013-09-17 NOTE — Progress Notes (Signed)
Pt has been her many times-she goes on lovenox while off xaralto due to hx dvt

## 2013-09-18 ENCOUNTER — Other Ambulatory Visit: Payer: Self-pay | Admitting: Plastic Surgery

## 2013-09-18 DIAGNOSIS — N6489 Other specified disorders of breast: Secondary | ICD-10-CM

## 2013-09-18 DIAGNOSIS — T8544XA Capsular contracture of breast implant, initial encounter: Secondary | ICD-10-CM

## 2013-09-18 NOTE — H&P (Signed)
Nancy Beard is an 58 y.o. female.   Chief Complaint:post surgical breast reconstruction asymmetry HPI: The patient is a 58 yrs old wf here for a history and physical for revision breast reconstruction. She underwent a bilateral mastectomy with immediate reconstruction with expander and ADM followed by implant placement. She was seen in the Multidisciplinary breast clinic for a newly diagnosed invasive ductal carcinoma of the right breast-grade 3, triple negative breast cancer. The lesion was located at the 5 o'clock position of the right breast and was detected on a screening mammogram. Her current fill volume is 430/350 cc on the left and 500/350 cc on the right. The right implant looks like it has rotated a little. She is 5 feet 6 inches tall, weighs 146 pounds and wore a 38 C bra.   Past Medical History  Diagnosis Date  . Thyroid disease   . Depression   . Sprue   . Breast cancer     right  . Hypothyroidism   . DVT (deep venous thrombosis) 2014    Right lower extremity, thigh  . Wears glasses   . Pneumonia     hx of years ago  . History of chemotherapy   . Neuropathy     of fingers and toes    Past Surgical History  Procedure Laterality Date  . Esophagogastroduodenoscopy  2007    sprue  . Colonoscopy  2006  . Cryoblation of cervix      long time ago  . Breast lumpectomy    . Total mastectomy Bilateral 09/11/2012    Procedure: bilateral MASTECTOMY;  Surgeon: Odis Hollingshead, MD;  Location: [place];  Service: General;  Laterality: Bilateral;  . Axillary sentinel node biopsy Right 09/11/2012    Procedure: AXILLARY SENTINEL lymph NODE  BIOPSY;  Surgeon: Odis Hollingshead, MD;  Location: [facility];  Service: General;  Laterality: Right;  right nuclear medicine injection 12:30   . Breast reconstruction with placement of tissue expander and flex hd (acellular hydrated dermis) Bilateral 09/11/2012    Procedure: BREAST RECONSTRUCTION WITH PLACEMENT OF TISSUE EXPANDER AND FLEX HD  (ACELLULAR HYDRATED DERMIS) ADM;  Surgeon: Theodoro Kos, DO;  Location: [facility];  Service: Plastics;  Laterality: Bilateral;  . Incision and drainage of wound Left 09/16/2012    Procedure: Left Breast Evacuation of Hematoma;  Surgeon: Theodoro Kos, DO;  Location: [facility];  Service: Plastics;  Laterality: Left;  . Portacath placement Right 10/09/2012    Procedure: US GUIDED INSERTION PORT-A-CATH;  Surgeon: Odis Hollingshead, MD;  Location: [facility];  Service: General;  Laterality: Right;  Right Subclavian Vein  . Removal of bilateral tissue expanders with placement of bilateral breast implants Bilateral 06/24/2013    Procedure: REMOVAL OF BILATERAL TISSUE EXPANDERS WITH PLACEMENT OF BILATERAL BREAST IMPLANTS;  Surgeon: Theodoro Kos, DO;  Location: [facility];  Service: Plastics;  Laterality: Bilateral;  . Vulvectomy N/A 07/14/2013    Procedure: WIDE LOCAL  EXCISION VULVAR;  Surgeon: Alvino Chapel, MD;  Location: [facility];  Service: Gynecology;  Laterality: N/A;  . Port-a-cath removal Right 08/07/2013    Procedure: REMOVAL PORT-A-CATH;  Surgeon: Odis Hollingshead, MD;  Location: [facility];  Service: General;  Laterality: Right;    Family History  Problem Relation Age of Onset  . Breast cancer Paternal Aunt     dx in her 58s  . Cancer Paternal Aunt     breast  . Lymphoma Paternal Uncle   .  Breast cancer Paternal Grandmother     dx >50  . Cancer Paternal Grandmother     breast  . Heart attack Paternal Grandfather   . Breast cancer Other     2 maternal great aunts with breast cancer >50  . Cancer Other     breast   Social History:  reports that she has never smoked. She has never used smokeless tobacco. She reports that she does not drink alcohol or use illicit drugs.  Allergies:  Allergies  Allergen Reactions  . Ciprofloxacin Itching and Other (See Comments)    Patient states she had muscle spasms  . Gluten Meal     (Not in a  hospital admission)  No results found for this or any previous visit (from the past 48 hour(s)). No results found.  ROS  Last menstrual period 09/11/2012. Physical Exam  Constitutional: She appears well-developed and well-nourished.  HENT:  Head: Normocephalic and atraumatic.  Eyes: Conjunctivae and EOM are normal. Pupils are equal, round, and reactive to light.  Cardiovascular: Normal rate.   Respiratory: Effort normal.  Musculoskeletal: Normal range of motion.  Neurological: She is alert.  Psychiatric: She has a normal mood and affect. Her behavior is normal. Judgment and thought content normal.     Assessment/Plan Revision right breast reconstruction with repositioning right implant, possible capsular contracture excision & Lipofilling  SANGER,Adair Lauderback 09/18/2013, 1:54 PM    

## 2013-09-24 ENCOUNTER — Encounter (HOSPITAL_BASED_OUTPATIENT_CLINIC_OR_DEPARTMENT_OTHER): Payer: Private Health Insurance - Indemnity | Admitting: Anesthesiology

## 2013-09-24 ENCOUNTER — Ambulatory Visit (HOSPITAL_BASED_OUTPATIENT_CLINIC_OR_DEPARTMENT_OTHER): Payer: Private Health Insurance - Indemnity | Admitting: Anesthesiology

## 2013-09-24 ENCOUNTER — Ambulatory Visit (HOSPITAL_BASED_OUTPATIENT_CLINIC_OR_DEPARTMENT_OTHER)
Admission: RE | Admit: 2013-09-24 | Discharge: 2013-09-24 | Disposition: A | Discharge status: Home / Self Care | Payer: Private Health Insurance - Indemnity | Source: Ambulatory Visit | Attending: Plastic Surgery | Admitting: Plastic Surgery

## 2013-09-24 ENCOUNTER — Encounter (HOSPITAL_BASED_OUTPATIENT_CLINIC_OR_DEPARTMENT_OTHER)
Admission: RE | Disposition: A | Discharge status: Home / Self Care | Payer: Self-pay | Source: Ambulatory Visit | Attending: Plastic Surgery

## 2013-09-24 ENCOUNTER — Encounter (HOSPITAL_BASED_OUTPATIENT_CLINIC_OR_DEPARTMENT_OTHER): Payer: Self-pay | Admitting: Plastic Surgery

## 2013-09-24 DIAGNOSIS — G589 Mononeuropathy, unspecified: Secondary | ICD-10-CM | POA: Insufficient documentation

## 2013-09-24 DIAGNOSIS — F3289 Other specified depressive episodes: Secondary | ICD-10-CM | POA: Insufficient documentation

## 2013-09-24 DIAGNOSIS — Y838 Other surgical procedures as the cause of abnormal reaction of the patient, or of later complication, without mention of misadventure at the time of the procedure: Secondary | ICD-10-CM | POA: Insufficient documentation

## 2013-09-24 DIAGNOSIS — T8544XA Capsular contracture of breast implant, initial encounter: Secondary | ICD-10-CM

## 2013-09-24 DIAGNOSIS — N6489 Other specified disorders of breast: Secondary | ICD-10-CM

## 2013-09-24 DIAGNOSIS — T8549XA Other mechanical complication of breast prosthesis and implant, initial encounter: Secondary | ICD-10-CM | POA: Insufficient documentation

## 2013-09-24 DIAGNOSIS — F329 Major depressive disorder, single episode, unspecified: Secondary | ICD-10-CM | POA: Insufficient documentation

## 2013-09-24 DIAGNOSIS — Z853 Personal history of malignant neoplasm of breast: Secondary | ICD-10-CM | POA: Insufficient documentation

## 2013-09-24 DIAGNOSIS — Z86718 Personal history of other venous thrombosis and embolism: Secondary | ICD-10-CM | POA: Insufficient documentation

## 2013-09-24 DIAGNOSIS — Z9010 Acquired absence of unspecified breast and nipple: Secondary | ICD-10-CM | POA: Insufficient documentation

## 2013-09-24 DIAGNOSIS — Z803 Family history of malignant neoplasm of breast: Secondary | ICD-10-CM | POA: Insufficient documentation

## 2013-09-24 DIAGNOSIS — N651 Disproportion of reconstructed breast: Secondary | ICD-10-CM | POA: Insufficient documentation

## 2013-09-24 DIAGNOSIS — E039 Hypothyroidism, unspecified: Secondary | ICD-10-CM | POA: Insufficient documentation

## 2013-09-24 DIAGNOSIS — E0500 Thyrotoxicosis with diffuse goiter without thyrotoxic crisis or storm: Secondary | ICD-10-CM | POA: Insufficient documentation

## 2013-09-24 HISTORY — PX: LIPOSUCTION WITH LIPOFILLING: SHX6436

## 2013-09-24 HISTORY — PX: BREAST RECONSTRUCTION: SHX9

## 2013-09-24 LAB — POCT HEMOGLOBIN-HEMACUE: Hemoglobin: 13.7 g/dL (ref 12.0–15.0)

## 2013-09-24 SURGERY — RECONSTRUCTION, BREAST
Anesthesia: General | Site: Breast | Laterality: Right

## 2013-09-24 MED ORDER — CEFAZOLIN SODIUM-DEXTROSE 2-3 GM-% IV SOLR
INTRAVENOUS | Status: AC
  Filled 2013-09-24: qty 50

## 2013-09-24 MED ORDER — LIDOCAINE HCL (CARDIAC) 20 MG/ML IV SOLN
INTRAVENOUS | Status: DC | PRN
  Administered 2013-09-24: 75 mg via INTRAVENOUS

## 2013-09-24 MED ORDER — HYDROMORPHONE HCL PF 1 MG/ML IJ SOLN
INTRAMUSCULAR | Status: AC
  Filled 2013-09-24: qty 1

## 2013-09-24 MED ORDER — SUFENTANIL CITRATE 50 MCG/ML IV SOLN
INTRAVENOUS | Status: DC | PRN
  Administered 2013-09-24: 20 ug via INTRAVENOUS

## 2013-09-24 MED ORDER — LACTATED RINGERS IV SOLN
INTRAVENOUS | Status: DC
Start: 2013-09-24 — End: 2013-09-24
  Administered 2013-09-24 (×2): via INTRAVENOUS

## 2013-09-24 MED ORDER — OXYCODONE HCL 5 MG PO TABS
ORAL_TABLET | ORAL | Status: AC
  Filled 2013-09-24: qty 1

## 2013-09-24 MED ORDER — LIDOCAINE-EPINEPHRINE 1 %-1:100000 IJ SOLN
INTRAMUSCULAR | Status: DC | PRN
  Administered 2013-09-24: 7 mL

## 2013-09-24 MED ORDER — MIDAZOLAM HCL 5 MG/5ML IJ SOLN
INTRAMUSCULAR | Status: DC | PRN
  Administered 2013-09-24: 2 mg via INTRAVENOUS

## 2013-09-24 MED ORDER — PROMETHAZINE HCL 25 MG/ML IJ SOLN: 6.2500 mg | INTRAMUSCULAR | Status: DC | PRN

## 2013-09-24 MED ORDER — LIDOCAINE HCL 1 % IJ SOLN
INTRAVENOUS | Status: DC | PRN
  Administered 2013-09-24: 11:00:00

## 2013-09-24 MED ORDER — OXYCODONE HCL 5 MG PO TABS
5.0000 mg | ORAL_TABLET | Freq: Once | ORAL | Status: AC | PRN
  Administered 2013-09-24: 5 mg via ORAL

## 2013-09-24 MED ORDER — SODIUM CHLORIDE 0.9 % IR SOLN
Status: DC | PRN
  Administered 2013-09-24: 11:00:00

## 2013-09-24 MED ORDER — DEXAMETHASONE SODIUM PHOSPHATE 4 MG/ML IJ SOLN
INTRAMUSCULAR | Status: DC | PRN
  Administered 2013-09-24: 10 mg via INTRAVENOUS

## 2013-09-24 MED ORDER — OXYCODONE HCL 5 MG/5ML PO SOLN: 5.0000 mg | Freq: Once | ORAL | Status: AC | PRN

## 2013-09-24 MED ORDER — BUPIVACAINE-EPINEPHRINE PF 0.25-1:200000 % IJ SOLN
INTRAMUSCULAR | Status: AC
  Filled 2013-09-24: qty 30

## 2013-09-24 MED ORDER — PROPOFOL 10 MG/ML IV BOLUS
INTRAVENOUS | Status: DC | PRN
  Administered 2013-09-24: 200 mg via INTRAVENOUS

## 2013-09-24 MED ORDER — FENTANYL CITRATE 0.05 MG/ML IJ SOLN: 50.0000 ug | INTRAMUSCULAR | Status: DC | PRN

## 2013-09-24 MED ORDER — MIDAZOLAM HCL 2 MG/2ML IJ SOLN
INTRAMUSCULAR | Status: AC
  Filled 2013-09-24: qty 2

## 2013-09-24 MED ORDER — MIDAZOLAM HCL 2 MG/2ML IJ SOLN: 1.0000 mg | INTRAMUSCULAR | Status: DC | PRN

## 2013-09-24 MED ORDER — LIDOCAINE HCL (PF) 1 % IJ SOLN
INTRAMUSCULAR | Status: AC
  Filled 2013-09-24: qty 30

## 2013-09-24 MED ORDER — EPHEDRINE SULFATE 50 MG/ML IJ SOLN
INTRAMUSCULAR | Status: DC | PRN
  Administered 2013-09-24 (×2): 10 mg via INTRAVENOUS

## 2013-09-24 MED ORDER — HYDROMORPHONE HCL PF 1 MG/ML IJ SOLN
0.2500 mg | INTRAMUSCULAR | Status: DC | PRN
  Administered 2013-09-24: 0.5 mg via INTRAVENOUS
  Administered 2013-09-24: 0.25 mg via INTRAVENOUS
  Administered 2013-09-24: 0.5 mg via INTRAVENOUS

## 2013-09-24 MED ORDER — CEFAZOLIN SODIUM-DEXTROSE 2-3 GM-% IV SOLR
2.0000 g | INTRAVENOUS | Status: AC
  Administered 2013-09-24: 2 g via INTRAVENOUS

## 2013-09-24 MED ORDER — SUFENTANIL CITRATE 50 MCG/ML IV SOLN
INTRAVENOUS | Status: AC
  Filled 2013-09-24: qty 1

## 2013-09-24 SURGICAL SUPPLY — 77 items
ADH SKN CLS APL DERMABOND .7 (GAUZE/BANDAGES/DRESSINGS) ×4
BAG DECANTER FOR FLEXI CONT (MISCELLANEOUS) ×3 IMPLANT
BINDER BREAST LRG (GAUZE/BANDAGES/DRESSINGS) ×1 IMPLANT
BINDER BREAST MEDIUM (GAUZE/BANDAGES/DRESSINGS) ×1 IMPLANT
BINDER BREAST XLRG (GAUZE/BANDAGES/DRESSINGS) IMPLANT
BINDER BREAST XXLRG (GAUZE/BANDAGES/DRESSINGS) IMPLANT
BIOPATCH RED 1 DISK 7.0 (GAUZE/BANDAGES/DRESSINGS) IMPLANT
BLADE HEX COATED 2.75 (ELECTRODE) ×3 IMPLANT
BLADE SURG 10 STRL SS (BLADE) IMPLANT
BLADE SURG 15 STRL LF DISP TIS (BLADE) ×2 IMPLANT
BLADE SURG 15 STRL SS (BLADE) ×3
BNDG GAUZE ELAST 4 BULKY (GAUZE/BANDAGES/DRESSINGS) ×6 IMPLANT
CANISTER LIPO FAT HARVEST (MISCELLANEOUS) ×3 IMPLANT
CANISTER SUCT 1200ML W/VALVE (MISCELLANEOUS) ×3 IMPLANT
CANNULA ASPIRATION (CANNULA) ×3 IMPLANT
CHLORAPREP W/TINT 26ML (MISCELLANEOUS) ×3 IMPLANT
COVER MAYO STAND STRL (DRAPES) ×3 IMPLANT
COVER TABLE BACK 60X90 (DRAPES) ×3 IMPLANT
DECANTER SPIKE VIAL GLASS SM (MISCELLANEOUS) IMPLANT
DERMABOND ADVANCED (GAUZE/BANDAGES/DRESSINGS) ×2
DERMABOND ADVANCED .7 DNX12 (GAUZE/BANDAGES/DRESSINGS) ×4 IMPLANT
DRAIN CHANNEL 19F RND (DRAIN) IMPLANT
DRAPE LAPAROSCOPIC ABDOMINAL (DRAPES) ×3 IMPLANT
ELECT BLADE 4.0 EZ CLEAN MEGAD (MISCELLANEOUS) ×3
ELECT BLADE 6.5 .24CM SHAFT (ELECTRODE) ×1 IMPLANT
ELECT REM PT RETURN 9FT ADLT (ELECTROSURGICAL) ×3
ELECTRODE BLDE 4.0 EZ CLN MEGD (MISCELLANEOUS) ×2 IMPLANT
ELECTRODE REM PT RTRN 9FT ADLT (ELECTROSURGICAL) ×2 IMPLANT
EVACUATOR SILICONE 100CC (DRAIN) IMPLANT
FILTER LIPOSUCTION (MISCELLANEOUS) ×3 IMPLANT
GLOVE BIO SURGEON STRL SZ 6.5 (GLOVE) ×9 IMPLANT
GLOVE SURG SS PI 7.0 STRL IVOR (GLOVE) ×1 IMPLANT
GOWN STRL REUS W/ TWL LRG LVL3 (GOWN DISPOSABLE) ×6 IMPLANT
GOWN STRL REUS W/TWL LRG LVL3 (GOWN DISPOSABLE) ×9
IV NS 1000ML (IV SOLUTION)
IV NS 1000ML BAXH (IV SOLUTION) IMPLANT
IV NS 500ML (IV SOLUTION)
IV NS 500ML BAXH (IV SOLUTION) ×2 IMPLANT
KIT FILL SYSTEM UNIVERSAL (SET/KITS/TRAYS/PACK) ×3 IMPLANT
LINER CANISTER 1000CC FLEX (MISCELLANEOUS) ×3 IMPLANT
NDL 21 GA WING INFUSION (NEEDLE) IMPLANT
NDL HYPO 25X1 1.5 SAFETY (NEEDLE) IMPLANT
NDL SAFETY ECLIPSE 18X1.5 (NEEDLE) ×2 IMPLANT
NDL SPNL 18GX3.5 QUINCKE PK (NEEDLE) ×2 IMPLANT
NEEDLE 21 GA WING INFUSION (NEEDLE) IMPLANT
NEEDLE HYPO 18GX1.5 SHARP (NEEDLE) ×3
NEEDLE HYPO 25X1 1.5 SAFETY (NEEDLE) ×3 IMPLANT
NEEDLE SPNL 18GX3.5 QUINCKE PK (NEEDLE) ×3 IMPLANT
NS IRRIG 1000ML POUR BTL (IV SOLUTION) IMPLANT
PACK BASIN DAY SURGERY FS (CUSTOM PROCEDURE TRAY) ×3 IMPLANT
PAD ABD 8X10 STRL (GAUZE/BANDAGES/DRESSINGS) ×5 IMPLANT
PAD ALCOHOL SWAB (MISCELLANEOUS) ×3 IMPLANT
PENCIL BUTTON HOLSTER BLD 10FT (ELECTRODE) ×3 IMPLANT
PIN SAFETY STERILE (MISCELLANEOUS) IMPLANT
SLEEVE SCD COMPRESS KNEE MED (MISCELLANEOUS) ×3 IMPLANT
SPONGE GAUZE 4X4 12PLY STER LF (GAUZE/BANDAGES/DRESSINGS) IMPLANT
SPONGE LAP 18X18 X RAY DECT (DISPOSABLE) ×6 IMPLANT
SUT MNCRL AB 4-0 PS2 18 (SUTURE) ×1 IMPLANT
SUT MON AB 5-0 PS2 18 (SUTURE) ×6 IMPLANT
SUT PDS 3-0 CT2 (SUTURE)
SUT PDS AB 2-0 CT2 27 (SUTURE) ×3 IMPLANT
SUT PDS II 3-0 CT2 27 ABS (SUTURE) IMPLANT
SUT SILK 3 0 PS 1 (SUTURE) IMPLANT
SUT VIC AB 3-0 SH 27 (SUTURE) ×3
SUT VIC AB 3-0 SH 27X BRD (SUTURE) ×2 IMPLANT
SUT VICRYL 4-0 PS2 18IN ABS (SUTURE) IMPLANT
SYR 20CC LL (SYRINGE) IMPLANT
SYR 50ML LL SCALE MARK (SYRINGE) ×4 IMPLANT
SYR BULB IRRIGATION 50ML (SYRINGE) ×3 IMPLANT
SYR CONTROL 10ML LL (SYRINGE) ×1 IMPLANT
SYR TB 1ML LL NO SAFETY (SYRINGE) ×3 IMPLANT
SYRINGE TOOMEY DISP (SYRINGE) IMPLANT
TOWEL OR 17X24 6PK STRL BLUE (TOWEL DISPOSABLE) ×6 IMPLANT
TUBE CONNECTING 20X1/4 (TUBING) ×3 IMPLANT
TUBING SET GRADUATE ASPIR 12FT (MISCELLANEOUS) ×3 IMPLANT
UNDERPAD 30X30 INCONTINENT (UNDERPADS AND DIAPERS) ×6 IMPLANT
YANKAUER SUCT BULB TIP NO VENT (SUCTIONS) ×3 IMPLANT

## 2013-09-24 NOTE — Anesthesia Postprocedure Evaluation (Signed)
Anesthesia Post Note  Patient: Nancy Beard  Procedure(s) Performed: Procedure(s) (LRB): REVISION OF RIGHT BREAST RECONSTRUCTION WITH REPOSITIONING RIGHT IMPLANT, POSSIBLE EXCISION CAPSULAR CONTRACTURE AND LIPOFILLING FOR FAT GRAFTING (Right) LIPOSUCTION WITH LIPOFILLING (Bilateral)  Anesthesia type: general  Patient location: PACU  Post pain: Pain level controlled  Post assessment: Patient's Cardiovascular Status Stable  Last Vitals:  Filed Vitals:   09/24/13 1215  BP: 132/65  Pulse: 107  Temp:   Resp: 18    Post vital signs: Reviewed and stable  Level of consciousness: sedated  Complications: No apparent anesthesia complications

## 2013-09-24 NOTE — Anesthesia Procedure Notes (Signed)
Procedure Name: LMA Insertion Date/Time: 09/24/2013 9:37 AM Performed by: Melynda Ripple D Pre-anesthesia Checklist: Patient identified, Emergency Drugs available, Suction available and Patient being monitored Patient Re-evaluated:Patient Re-evaluated prior to inductionOxygen Delivery Method: Circle System Utilized Preoxygenation: Pre-oxygenation with 100% oxygen Intubation Type: IV induction Ventilation: Mask ventilation without difficulty LMA: LMA inserted LMA Size: 4.0 Number of attempts: 1 Airway Equipment and Method: bite block Placement Confirmation: positive ETCO2 Tube secured with: Tape Dental Injury: Teeth and Oropharynx as per pre-operative assessment

## 2013-09-24 NOTE — H&P (View-Only) (Signed)
Nancy Beard is an 58 y.o. female.   Chief Complaint:post surgical breast reconstruction asymmetry HPI: The patient is a 58 yrs old wf here for a history and physical for revision breast reconstruction. She underwent a bilateral mastectomy with immediate reconstruction with expander and ADM followed by implant placement. She was seen in the Multidisciplinary breast clinic for a newly diagnosed invasive ductal carcinoma of the right breast-grade 3, triple negative breast cancer. The lesion was located at the 5 o'clock position of the right breast and was detected on a screening mammogram. Her current fill volume is 430/350 cc on the left and 500/350 cc on the right. The right implant looks like it has rotated a little. She is 5 feet 6 inches tall, weighs 146 pounds and wore a 38 C bra.   Past Medical History  Diagnosis Date  . Thyroid disease   . Depression   . Sprue   . Breast cancer     right  . Hypothyroidism   . DVT (deep venous thrombosis) 2014    Right lower extremity, thigh  . Wears glasses   . Pneumonia     hx of years ago  . History of chemotherapy   . Neuropathy     of fingers and toes    Past Surgical History  Procedure Laterality Date  . Esophagogastroduodenoscopy  2007    sprue  . Colonoscopy  2006  . Cryoblation of cervix      long time ago  . Breast lumpectomy    . Total mastectomy Bilateral 09/11/2012    Procedure: bilateral MASTECTOMY;  Surgeon: Odis Hollingshead, MD;  Location: [place];  Service: General;  Laterality: Bilateral;  . Axillary sentinel node biopsy Right 09/11/2012    Procedure: AXILLARY SENTINEL lymph NODE  BIOPSY;  Surgeon: Odis Hollingshead, MD;  Location: [facility];  Service: General;  Laterality: Right;  right nuclear medicine injection 12:30   . Breast reconstruction with placement of tissue expander and flex hd (acellular hydrated dermis) Bilateral 09/11/2012    Procedure: BREAST RECONSTRUCTION WITH PLACEMENT OF TISSUE EXPANDER AND FLEX HD  (ACELLULAR HYDRATED DERMIS) ADM;  Surgeon: Theodoro Kos, DO;  Location: [facility];  Service: Plastics;  Laterality: Bilateral;  . Incision and drainage of wound Left 09/16/2012    Procedure: Left Breast Evacuation of Hematoma;  Surgeon: Theodoro Kos, DO;  Location: [facility];  Service: Plastics;  Laterality: Left;  . Portacath placement Right 10/09/2012    Procedure: US GUIDED INSERTION PORT-A-CATH;  Surgeon: Odis Hollingshead, MD;  Location: [facility];  Service: General;  Laterality: Right;  Right Subclavian Vein  . Removal of bilateral tissue expanders with placement of bilateral breast implants Bilateral 06/24/2013    Procedure: REMOVAL OF BILATERAL TISSUE EXPANDERS WITH PLACEMENT OF BILATERAL BREAST IMPLANTS;  Surgeon: Theodoro Kos, DO;  Location: [facility];  Service: Plastics;  Laterality: Bilateral;  . Vulvectomy N/A 07/14/2013    Procedure: WIDE LOCAL  EXCISION VULVAR;  Surgeon: Alvino Chapel, MD;  Location: [facility];  Service: Gynecology;  Laterality: N/A;  . Port-a-cath removal Right 08/07/2013    Procedure: REMOVAL PORT-A-CATH;  Surgeon: Odis Hollingshead, MD;  Location: [facility];  Service: General;  Laterality: Right;    Family History  Problem Relation Age of Onset  . Breast cancer Paternal Aunt     dx in her 58s  . Cancer Paternal Aunt     breast  . Lymphoma Paternal Uncle   .  Breast cancer Paternal Grandmother     dx >50  . Cancer Paternal Grandmother     breast  . Heart attack Paternal Grandfather   . Breast cancer Other     2 maternal great aunts with breast cancer >50  . Cancer Other     breast   Social History:  reports that she has never smoked. She has never used smokeless tobacco. She reports that she does not drink alcohol or use illicit drugs.  Allergies:  Allergies  Allergen Reactions  . Ciprofloxacin Itching and Other (See Comments)    Patient states she had muscle spasms  . Gluten Meal     (Not in a  hospital admission)  No results found for this or any previous visit (from the past 48 hour(s)). No results found.  ROS  Last menstrual period 09/11/2012. Physical Exam  Constitutional: She appears well-developed and well-nourished.  HENT:  Head: Normocephalic and atraumatic.  Eyes: Conjunctivae and EOM are normal. Pupils are equal, round, and reactive to light.  Cardiovascular: Normal rate.   Respiratory: Effort normal.  Musculoskeletal: Normal range of motion.  Neurological: She is alert.  Psychiatric: She has a normal mood and affect. Her behavior is normal. Judgment and thought content normal.     Assessment/Plan Revision right breast reconstruction with repositioning right implant, possible capsular contracture excision & Lipofilling  SANGER,Zuriel Roskos 09/18/2013, 1:54 PM

## 2013-09-24 NOTE — Brief Op Note (Signed)
09/24/2013  11:06 AM  PATIENT:  Darrall Dears  58 y.o. female  PRE-OPERATIVE DIAGNOSIS:  history of right breast cancer  POST-OPERATIVE DIAGNOSIS:  history of right breast cancer  PROCEDURE:  Procedure(s) with comments: REVISION OF RIGHT BREAST RECONSTRUCTION WITH REPOSITIONING RIGHT IMPLANT, POSSIBLE EXCISION CAPSULAR CONTRACTURE AND LIPOFILLING FOR FAT GRAFTING (Right) LIPOSUCTION WITH LIPOFILLING (Bilateral) - Biltalteal filling breast  SURGEON:  Surgeon(s) and Role:    * Jerret Mcbane Sanger, DO - Primary  PHYSICIAN ASSISTANT: Shawn Rayburn, PA  ASSISTANTS: none   ANESTHESIA:   general  EBL:  Total I/O In: 1000 [I.V.:1000] Out: -   BLOOD ADMINISTERED:none  DRAINS: none   LOCAL MEDICATIONS USED:  LIDOCAINE   SPECIMEN:  Source of Specimen:  right breast scar and inferior capsule.  DISPOSITION OF SPECIMEN:  PATHOLOGY  COUNTS:  YES  TOURNIQUET:  * No tourniquets in log *  DICTATION: .Dragon Dictation  PLAN OF CARE: Discharge to home after PACU  PATIENT DISPOSITION:  PACU - hemodynamically stable.   Delay start of Pharmacological VTE agent (>24hrs) due to surgical blood loss or risk of bleeding: no

## 2013-09-24 NOTE — Op Note (Addendum)
Op report    DATE OF OPERATION:  09/24/2013  LOCATION: [facility]  SURGICAL DIVISION: Plastic Surgery  PREOPERATIVE DIAGNOSES:  1. History of breast cancer.  2. Right breast capsule contracture with malposition. 3. Post reconstruction asymmetry.   POSTOPERATIVE DIAGNOSES: Same  PROCEDURE:  1. Right breast implant respositioning. 2. Right breast capsule contracture release. 3. Lipofilling bilaterally (50 cc on left and 130 cc on right).  4. Excision of left breast dog ear.  SURGEON: Theodoro Kos, DO  ASSISTANT: Shawn Rayburn, PA  ANESTHESIA:  General.   COMPLICATIONS: None.   INDICATIONS FOR PROCEDURE:  The patient, Nancy Beard, is a 58 y.o. female born on 08-15-1955, is here for further treatment after a mastectomy and placement of a tissue expander followed by implant. She now presents for revision and capsulotomy for better positioning of the implant.   CONSENT:  Informed consent was obtained directly from the patient. Risks, benefits and alternatives were fully discussed. Specific risks including but not limited to bleeding, infection, hematoma, seroma, scarring, pain, implant infection, implant extrusion, capsular contracture, asymmetry, wound healing problems, and need for further surgery were all discussed. The patient did have an ample opportunity to have her questions answered to her satisfaction.   DESCRIPTION OF PROCEDURE:  The patient was taken to the operating room. SCDs were placed and IV antibiotics were given. The patient's chest was prepped and draped in a sterile fashion. A time out was performed and all information was confirmed to be correct.  One percent Xylocaine with epinephrine was used to infiltrate the area.   The right old mastectomy scar was opened and superior mastectomy and inferior mastectomy flaps were re-raised over the pectoralis major muscle for 2-3 cm. The pectoralis was split to expose the tissue expander which was removed.  Inspection of the pocket showed a tight lateral capsule and good integration of the biologic matrix.  Right lateral and superior capsulotomies were performed to allow for breast pocket expansion.  Hemostasis was ensured.  The inframammary fold and capsule was excised and the fold repositioned superiorly by 1 cm to achieve symmetry.  Gloves were changed.  The implant was placed in antibiotic solution when it was removed and it was then replaced in the pocket and oriented appropriately. The pectoralis major muscle and capsule on the anterior surface were re-closed with a 3-0 running Vicryl suture. The remaining skin was closed with 4-0 Monocryl deep dermal and 5-0 Monocryl subcuticular stitches.  The lateral incision of the left breast was excised and the dog ear removed.  Hemostasis was achieved with electrocautery.  The deep layer was closed with 4-0 Monocryl followed by 5-0 Monocryl.  The tumescent was injected into the fat layer of the abdomen.  The area was then liposuctioned (500cc removed).  The fat was prepared and then used to fill the superior part of the left breast and the superior and lateral part of the right breast. Dermabond was applied.  A breast binder and ABD was applied.  The patient was allowed to wake from anesthesia and taken to the recovery room in satisfactory condition.

## 2013-09-24 NOTE — Transfer of Care (Signed)
Immediate Anesthesia Transfer of Care Note  Patient: Nancy Beard  Procedure(s) Performed: Procedure(s) with comments: REVISION OF RIGHT BREAST RECONSTRUCTION WITH REPOSITIONING RIGHT IMPLANT, POSSIBLE EXCISION CAPSULAR CONTRACTURE AND LIPOFILLING FOR FAT GRAFTING (Right) LIPOSUCTION WITH LIPOFILLING (Bilateral) - Biltalteal filling breast  Patient Location: PACU  Anesthesia Type:General  Level of Consciousness: awake and oriented  Airway & Oxygen Therapy: Patient Spontanous Breathing and Patient connected to face mask oxygen  Post-op Assessment: Report given to PACU RN  Post vital signs: Reviewed and stable  Complications: No apparent anesthesia complications

## 2013-09-24 NOTE — Interval H&P Note (Signed)
History and Physical Interval Note:  09/24/2013 7:02 AM  Nancy Beard  has presented today for surgery, with the diagnosis of history of right breast cancer  The various methods of treatment have been discussed with the patient and family. After consideration of risks, benefits and other options for treatment, the patient has consented to  Procedure(s): REVISION OF RIGHT BREAST RECONSTRUCTION WITH REPOSITIONING RIGHT IMPLANT, POSSIBLE EXCISION CAPSULAR CONTRACTURE AND LIPOFILLING FOR FAT GRAFTING (Right) LIPOSUCTION WITH LIPOFILLING (Right) as a surgical intervention .  The patient's history has been reviewed, patient examined, no change in status, stable for surgery.  I have reviewed the patient's chart and labs.  Questions were answered to the patient's satisfaction.     SANGER,CLAIRE

## 2013-09-24 NOTE — Discharge Instructions (Addendum)
Continue binder on breast and abdomen. May use spanx and sports bra  Call your surgeon if you experience:   1.  Fever over 101.0. 2.  Inability to urinate. 3.  Nausea and/or vomiting. 4.  Extreme swelling or bruising at the surgical site. 5.  Continued bleeding from the incision. 6.  Increased pain, redness or drainage from the incision. 7.  Problems related to your pain medication.  Post Anesthesia Home Care Instructions  Activity: Get plenty of rest for the remainder of the day. A responsible adult should stay with you for 24 hours following the procedure.  For the next 24 hours, DO NOT: -Drive a car -Paediatric nurse -Drink alcoholic beverages -Take any medication unless instructed by your physician -Make any legal decisions or sign important papers.  Meals: Start with liquid foods such as gelatin or soup. Progress to regular foods as tolerated. Avoid greasy, spicy, heavy foods. If nausea and/or vomiting occur, drink only clear liquids until the nausea and/or vomiting subsides. Call your physician if vomiting continues.  Special Instructions/Symptoms: Your throat may feel dry or sore from the anesthesia or the breathing tube placed in your throat during surgery. If this causes discomfort, gargle with warm salt water. The discomfort should disappear within 24 hours.

## 2013-09-24 NOTE — Anesthesia Preprocedure Evaluation (Signed)
Anesthesia Evaluation  Patient identified by MRN, date of birth, ID band Patient awake    Reviewed: Allergy & Precautions, H&P , NPO status , Patient's Chart, lab work & pertinent test results  Airway       Dental  (+) Dental Advidsory Given, Teeth Intact   Pulmonary neg pulmonary ROS,  breath sounds clear to auscultation        Cardiovascular negative cardio ROS  Rhythm:regular Rate:Normal     Neuro/Psych negative neurological ROS  negative psych ROS   GI/Hepatic negative GI ROS, Neg liver ROS,   Endo/Other  Hypothyroidism   Renal/GU negative Renal ROS     Musculoskeletal   Abdominal   Peds  Hematology   Anesthesia Other Findings   Reproductive/Obstetrics negative OB ROS                           Anesthesia Physical Anesthesia Plan  ASA: II  Anesthesia Plan: General LMA   Post-op Pain Management:    Induction:   Airway Management Planned:   Additional Equipment:   Intra-op Plan:   Post-operative Plan:   Informed Consent: I have reviewed the patients History and Physical, chart, labs and discussed the procedure including the risks, benefits and alternatives for the proposed anesthesia with the patient or authorized representative who has indicated his/her understanding and acceptance.   Dental Advisory Given  Plan Discussed with: Anesthesiologist, CRNA and Surgeon  Anesthesia Plan Comments:         Anesthesia Quick Evaluation

## 2013-09-25 ENCOUNTER — Ambulatory Visit: Payer: Private Health Insurance - Indemnity | Admitting: Gynecology

## 2013-09-28 ENCOUNTER — Encounter (HOSPITAL_BASED_OUTPATIENT_CLINIC_OR_DEPARTMENT_OTHER): Payer: Self-pay | Admitting: Plastic Surgery

## 2013-09-28 NOTE — Addendum Note (Signed)
Addendum created 09/28/13 1318 by Duane Boston, MD   Modules edited: Anesthesia Attestations, Anesthesia Events

## 2013-10-11 ENCOUNTER — Other Ambulatory Visit: Payer: Self-pay | Admitting: Oncology

## 2013-10-19 ENCOUNTER — Telehealth: Payer: Self-pay | Admitting: Internal Medicine

## 2013-10-19 ENCOUNTER — Other Ambulatory Visit: Payer: Self-pay | Admitting: Oncology

## 2013-10-19 DIAGNOSIS — C50319 Malignant neoplasm of lower-inner quadrant of unspecified female breast: Secondary | ICD-10-CM

## 2013-10-19 NOTE — Telephone Encounter (Signed)
Pt is calling because her husband was diagnosed with walking pneumonia this morning.  Pt states she was diagnosed with breast cancer last year and has a compromised immune system. She is inquiring if she should be on antibiotics as a precaution.

## 2013-10-20 ENCOUNTER — Telehealth: Payer: Self-pay

## 2013-10-20 NOTE — Telephone Encounter (Signed)
Per Abby Potash, pt should call oncology to ask

## 2013-10-20 NOTE — Telephone Encounter (Signed)
Pt aware and verbalized udnerstanding

## 2013-10-20 NOTE — Telephone Encounter (Signed)
Pt asking if she needs to be on antibiotics - husband has pneumonia.  Treatment completed early Sept, last WBC in Jan 2015 in normal range.  Let patient know that, from an oncology perspective she should be no more susceptible than anyone else.  Patient voiced understanding.

## 2013-11-10 ENCOUNTER — Ambulatory Visit (HOSPITAL_COMMUNITY)
Admission: RE | Admit: 2013-11-10 | Discharge: 2013-11-10 | Disposition: A | Discharge status: Home / Self Care | Payer: Managed Care, Other (non HMO) | Source: Ambulatory Visit | Attending: Oncology | Admitting: Oncology

## 2013-11-10 DIAGNOSIS — I82409 Acute embolism and thrombosis of unspecified deep veins of unspecified lower extremity: Secondary | ICD-10-CM

## 2013-11-10 DIAGNOSIS — C50319 Malignant neoplasm of lower-inner quadrant of unspecified female breast: Secondary | ICD-10-CM | POA: Insufficient documentation

## 2013-11-10 NOTE — Progress Notes (Signed)
Right lower extremity venous duplex completed.  Right:  DVT noted still in the popliteal vein.  No evidence of superficial thrombosis.  No Baker's cyst.  Left:  Negative for DVT in the common femoral vein.

## 2013-11-11 ENCOUNTER — Telehealth: Payer: Self-pay | Admitting: Oncology

## 2013-11-11 NOTE — Telephone Encounter (Signed)
kk out - pt to see cp2 5/28. s/w pt she is aware.

## 2013-11-17 ENCOUNTER — Other Ambulatory Visit: Payer: Private Health Insurance - Indemnity

## 2013-11-17 ENCOUNTER — Ambulatory Visit: Payer: Private Health Insurance - Indemnity | Admitting: Oncology

## 2013-12-04 ENCOUNTER — Encounter: Payer: Self-pay | Admitting: Internal Medicine

## 2013-12-04 ENCOUNTER — Ambulatory Visit (INDEPENDENT_AMBULATORY_CARE_PROVIDER_SITE_OTHER): Payer: Managed Care, Other (non HMO) | Admitting: Internal Medicine

## 2013-12-04 VITALS — BP 122/66 | HR 99 | Temp 98.1°F | Ht 66.0 in | Wt 156.0 lb

## 2013-12-04 DIAGNOSIS — R7989 Other specified abnormal findings of blood chemistry: Secondary | ICD-10-CM

## 2013-12-04 DIAGNOSIS — R945 Abnormal results of liver function studies: Secondary | ICD-10-CM

## 2013-12-04 DIAGNOSIS — E039 Hypothyroidism, unspecified: Secondary | ICD-10-CM

## 2013-12-04 LAB — HEPATIC FUNCTION PANEL
ALT: 74 U/L — ABNORMAL HIGH (ref 0–35)
AST: 51 U/L — ABNORMAL HIGH (ref 0–37)
Albumin: 4 g/dL (ref 3.5–5.2)
Alkaline Phosphatase: 163 U/L — ABNORMAL HIGH (ref 39–117)
Bilirubin, Direct: 0.1 mg/dL (ref 0.0–0.3)
Total Bilirubin: 0.8 mg/dL (ref 0.2–1.2)
Total Protein: 7.2 g/dL (ref 6.0–8.3)

## 2013-12-04 LAB — T3, FREE: T3, Free: 2.9 pg/mL (ref 2.3–4.2)

## 2013-12-04 LAB — T4, FREE: Free T4: 0.48 ng/dL — ABNORMAL LOW (ref 0.60–1.60)

## 2013-12-04 NOTE — Progress Notes (Signed)
Subjective:    Patient ID: Nancy Beard, female    DOB: 10/05/1955, 58 y.o.   MRN: [id number]  HPI Breast cancer surviver Hypothyroid, depression On xarelto per oncology Continued the arimidex and has noted vasomotor side effects   Review of Systems  Constitutional: Negative for activity change, appetite change and fatigue.  HENT: Negative for congestion, ear pain, postnasal drip and sinus pressure.   Eyes: Negative for redness and visual disturbance.  Respiratory: Negative for cough, shortness of breath and wheezing.   Gastrointestinal: Negative for abdominal pain and abdominal distention.  Genitourinary: Negative for dysuria, frequency and menstrual problem.  Musculoskeletal: Negative for arthralgias, joint swelling, myalgias and neck pain.  Skin: Negative for rash and wound.  Neurological: Negative for dizziness, weakness and headaches.  Hematological: Negative for adenopathy. Does not bruise/bleed easily.  Psychiatric/Behavioral: Negative for sleep disturbance and decreased concentration.   Past Medical History  Diagnosis Date  . Thyroid disease   . Depression   . Sprue   . Breast cancer     right  . Hypothyroidism   . DVT (deep venous thrombosis) 2014    Right lower extremity, thigh  . Wears glasses   . Pneumonia     hx of years ago  . History of chemotherapy   . Neuropathy     of fingers and toes    History   Social History  . Marital Status: Married    Spouse Name: N/A    Number of Children: N/A  . Years of Education: N/A   Occupational History  . Not on file.   Social History Main Topics  . Smoking status: Never Smoker   . Smokeless tobacco: Never Used  . Alcohol Use: No  . Drug Use: No  . Sexual Activity: Yes    Birth Control/ Protection: Post-menopausal   Other Topics Concern  . Not on file   Social History Narrative  . No narrative on file    Past Surgical History  Procedure Laterality Date  . Esophagogastroduodenoscopy  2007   sprue  . Colonoscopy  2006  . Cryoblation of cervix      long time ago  . Breast lumpectomy    . Total mastectomy Bilateral 09/11/2012    Procedure: bilateral MASTECTOMY;  Surgeon: Odis Hollingshead, MD;  Location: [place];  Service: General;  Laterality: Bilateral;  . Axillary sentinel node biopsy Right 09/11/2012    Procedure: AXILLARY SENTINEL lymph NODE  BIOPSY;  Surgeon: Odis Hollingshead, MD;  Location: [place];  Service: General;  Laterality: Right;  right nuclear medicine injection 12:30   . Breast reconstruction with placement of tissue expander and flex hd (acellular hydrated dermis) Bilateral 09/11/2012    Procedure: BREAST RECONSTRUCTION WITH PLACEMENT OF TISSUE EXPANDER AND FLEX HD (ACELLULAR HYDRATED DERMIS) ADM;  Surgeon: Theodoro Kos, DO;  Location: [place];  Service: Plastics;  Laterality: Bilateral;  . Incision and drainage of wound Left 09/16/2012    Procedure: Left Breast Evacuation of Hematoma;  Surgeon: Theodoro Kos, DO;  Location: [place];  Service: Plastics;  Laterality: Left;  . Portacath placement Right 10/09/2012    Procedure: US GUIDED INSERTION PORT-A-CATH;  Surgeon: Odis Hollingshead, MD;  Location: [place];  Service: General;  Laterality: Right;  Right Subclavian Vein  . Removal of bilateral tissue expanders with placement of bilateral breast implants Bilateral 06/24/2013    Procedure: REMOVAL OF BILATERAL TISSUE EXPANDERS WITH PLACEMENT OF BILATERAL BREAST IMPLANTS;  Surgeon: Theodoro Kos, DO;  Location: [place];  Service: Plastics;  Laterality: Bilateral;  . Vulvectomy N/A 07/14/2013    Procedure: WIDE LOCAL  EXCISION VULVAR;  Surgeon: Alvino Chapel, MD;  Location: [facility];  Service: Gynecology;  Laterality: N/A;  . Port-a-cath removal Right 08/07/2013    Procedure: REMOVAL PORT-A-CATH;  Surgeon: Odis Hollingshead, MD;  Location: [place];  Service: General;  Laterality: Right;  . Breast reconstruction Right  09/24/2013    Procedure: REVISION OF RIGHT BREAST RECONSTRUCTION WITH REPOSITIONING RIGHT IMPLANT, POSSIBLE EXCISION CAPSULAR CONTRACTURE AND LIPOFILLING FOR FAT GRAFTING;  Surgeon: Theodoro Kos, DO;  Location: [facility];  Service: Plastics;  Laterality: Right;  . Liposuction with lipofilling Bilateral 09/24/2013    Procedure: LIPOSUCTION WITH LIPOFILLING;  Surgeon: Theodoro Kos, DO;  Location: [place];  Service: Plastics;  Laterality: Bilateral;  Biltalteal filling breast    Family History  Problem Relation Age of Onset  . Breast cancer Paternal Aunt     dx in her 61s  . Cancer Paternal Aunt     breast  . Lymphoma Paternal Uncle   . Breast cancer Paternal Grandmother     dx >50  . Cancer Paternal Grandmother     breast  . Heart attack Paternal Grandfather   . Breast cancer Other     2 maternal great aunts with breast cancer >50  . Cancer Other     breast    Allergies  Allergen Reactions  . Ciprofloxacin Itching and Other (See Comments)    Patient states she had muscle spasms  . Gluten Meal     Current Outpatient Prescriptions on File Prior to Visit  Medication Sig Dispense Refill  . alendronate (FOSAMAX) 70 MG tablet TAKE 1 TABLET ONCE A WEEK WITH A FULL GLASS OF WATER ON AN EMPTY STOMACH  12 tablet  0  . anastrozole (ARIMIDEX) 1 MG tablet TAKE 1 TABLET (1 MG TOTAL) BY MOUTH DAILY.  90 tablet  0  . B Complex-C (SUPER B COMPLEX PO) Take 1 tablet by mouth daily.      [name] gabapentin (NEURONTIN) 100 MG capsule Take 2 capsules (200 mg total) by mouth 3 (three) times daily.  540 capsule  3  . HYDROcodone-acetaminophen (NORCO/VICODIN) 5-325 MG per tablet Take 1-2 tablets by mouth every 4 (four) hours as needed.  20 tablet  0  . levothyroxine (SYNTHROID, LEVOTHROID) 25 MCG tablet TAKE 1 TABLET EVERY DAY  90 tablet  3  . liothyronine (CYTOMEL) 25 MCG tablet Take 12.5 mcg by mouth daily.      . Multiple Vitamin (MULTIVITAMIN) tablet Take 1 tablet by  mouth daily.      . Rivaroxaban (XARELTO PO) Take 1 tablet by mouth every morning.      . valACYclovir (VALTREX) 500 MG tablet       . venlafaxine XR (EFFEXOR-XR) 75 MG 24 hr capsule Take 75 mg by mouth daily with breakfast.       No current facility-administered medications on file prior to visit.    BP 122/66  Pulse 99  Temp(Src) 98.1 F (36.7 C) (Oral)  Ht 5\' 6"  (1.676 m)  Wt 156 lb (70.761 kg)  BMI 25.19 kg/m2  SpO2 98%  LMP 09/11/2012       Objective:   Physical Exam  Constitutional: She is oriented to person, place, and time. She appears well-developed and well-nourished. No distress.  HENT:  Head: Normocephalic and atraumatic.  Eyes: Conjunctivae and EOM are normal.  Pupils are equal, round, and reactive to light.  Neck: Normal range of motion. Neck supple. No JVD present. No tracheal deviation present. No thyromegaly present.  Cardiovascular: Normal rate, regular rhythm and intact distal pulses.   No murmur heard. Pulmonary/Chest: Effort normal and breath sounds normal. She has no wheezes. She exhibits no tenderness.  Abdominal: Soft. Bowel sounds are normal.  Musculoskeletal: Normal range of motion. She exhibits no edema and no tenderness.  Lymphadenopathy:    She has no cervical adenopathy.  Neurological: She is alert and oriented to person, place, and time. She has normal reflexes. No cranial nerve deficit.  Skin: Skin is warm and dry. She is not diaphoretic.  Psychiatric: She has a normal mood and affect. Her behavior is normal.          Assessment & Plan:  Weight gain noted Neuropathy Hypothyroid and need t3 and t4 free Anticoagulation timing? Per oncology  Still has some residual DVt  Possibly to stay on the xarelto for 3 months

## 2013-12-04 NOTE — Patient Instructions (Signed)
The patient is instructed to continue all medications as prescribed. Schedule followup with check out clerk upon leaving the clinic  

## 2013-12-04 NOTE — Progress Notes (Signed)
Pre visit review using our clinic review tool, if applicable. No additional management support is needed unless otherwise documented below in the visit note. 

## 2013-12-09 ENCOUNTER — Other Ambulatory Visit: Payer: Self-pay | Admitting: Internal Medicine

## 2013-12-10 ENCOUNTER — Ambulatory Visit (HOSPITAL_BASED_OUTPATIENT_CLINIC_OR_DEPARTMENT_OTHER): Payer: Managed Care, Other (non HMO) | Admitting: Internal Medicine

## 2013-12-10 ENCOUNTER — Other Ambulatory Visit (HOSPITAL_BASED_OUTPATIENT_CLINIC_OR_DEPARTMENT_OTHER): Payer: Managed Care, Other (non HMO)

## 2013-12-10 VITALS — BP 117/75 | HR 86 | Temp 97.8°F | Resp 18 | Ht 66.0 in | Wt 152.9 lb

## 2013-12-10 DIAGNOSIS — IMO0002 Reserved for concepts with insufficient information to code with codable children: Secondary | ICD-10-CM

## 2013-12-10 DIAGNOSIS — G609 Hereditary and idiopathic neuropathy, unspecified: Secondary | ICD-10-CM

## 2013-12-10 DIAGNOSIS — R7989 Other specified abnormal findings of blood chemistry: Secondary | ICD-10-CM

## 2013-12-10 DIAGNOSIS — T451X5A Adverse effect of antineoplastic and immunosuppressive drugs, initial encounter: Secondary | ICD-10-CM | POA: Insufficient documentation

## 2013-12-10 DIAGNOSIS — I82409 Acute embolism and thrombosis of unspecified deep veins of unspecified lower extremity: Secondary | ICD-10-CM

## 2013-12-10 DIAGNOSIS — R61 Generalized hyperhidrosis: Secondary | ICD-10-CM

## 2013-12-10 DIAGNOSIS — C50319 Malignant neoplasm of lower-inner quadrant of unspecified female breast: Secondary | ICD-10-CM

## 2013-12-10 DIAGNOSIS — R110 Nausea: Secondary | ICD-10-CM

## 2013-12-10 DIAGNOSIS — M810 Age-related osteoporosis without current pathological fracture: Secondary | ICD-10-CM

## 2013-12-10 DIAGNOSIS — Z171 Estrogen receptor negative status [ER-]: Secondary | ICD-10-CM

## 2013-12-10 DIAGNOSIS — I824Y9 Acute embolism and thrombosis of unspecified deep veins of unspecified proximal lower extremity: Secondary | ICD-10-CM

## 2013-12-10 DIAGNOSIS — R232 Flushing: Secondary | ICD-10-CM

## 2013-12-10 LAB — COMPREHENSIVE METABOLIC PANEL (CC13)
ALT: 43 U/L (ref 0–55)
AST: 31 U/L (ref 5–34)
Albumin: 3.9 g/dL (ref 3.5–5.0)
Alkaline Phosphatase: 162 U/L — ABNORMAL HIGH (ref 40–150)
Anion Gap: 11 mEq/L (ref 3–11)
BUN: 10.4 mg/dL (ref 7.0–26.0)
CO2: 22 mEq/L (ref 22–29)
Calcium: 9.2 mg/dL (ref 8.4–10.4)
Chloride: 108 mEq/L (ref 98–109)
Creatinine: 0.7 mg/dL (ref 0.6–1.1)
Glucose: 103 mg/dl (ref 70–140)
Potassium: 4.3 mEq/L (ref 3.5–5.1)
Sodium: 141 mEq/L (ref 136–145)
Total Bilirubin: 0.58 mg/dL (ref 0.20–1.20)
Total Protein: 7 g/dL (ref 6.4–8.3)

## 2013-12-10 LAB — CBC WITH DIFFERENTIAL/PLATELET
BASO%: 0.8 % (ref 0.0–2.0)
Basophils Absolute: 0 10*3/uL (ref 0.0–0.1)
EOS%: 2.3 % (ref 0.0–7.0)
Eosinophils Absolute: 0.1 10*3/uL (ref 0.0–0.5)
HCT: 40.9 % (ref 34.8–46.6)
HGB: 13.4 g/dL (ref 11.6–15.9)
LYMPH%: 38.1 % (ref 14.0–49.7)
MCH: 28.3 pg (ref 25.1–34.0)
MCHC: 32.8 g/dL (ref 31.5–36.0)
MCV: 86.4 fL (ref 79.5–101.0)
MONO#: 0.6 10*3/uL (ref 0.1–0.9)
MONO%: 11.4 % (ref 0.0–14.0)
NEUT#: 2.5 10*3/uL (ref 1.5–6.5)
NEUT%: 47.4 % (ref 38.4–76.8)
Platelets: 240 10*3/uL (ref 145–400)
RBC: 4.74 10*6/uL (ref 3.70–5.45)
RDW: 15.1 % — ABNORMAL HIGH (ref 11.2–14.5)
WBC: 5.4 10*3/uL (ref 3.9–10.3)
lymph#: 2 10*3/uL (ref 0.9–3.3)

## 2013-12-10 MED ORDER — VENLAFAXINE HCL ER 75 MG PO CP24: 75.0000 mg | ORAL_CAPSULE | Freq: Every day | ORAL | Status: DC

## 2013-12-10 NOTE — Progress Notes (Signed)
OFFICE PROGRESS NOTE  Referral: Georgetta Haber, MD [address] [place] [address] Dr. Thea Silversmith  Dr. Jackolyn Confer  CC: BREAST CANCER  HISTORY OF PRESENT ILLNESS:  58 year old female with: 1) Stage IIA (T2, N0, cM0)  RIGHT lower-inner quadrant of breast cancer that is triple negative (ER 2%, PR- and HER2/neu negative)  Breast Cancer history: -Her breast disease was discovered at a screening mammogram in 06/2012 that showed a spiculated mass in the lower inner quadrant of the right breast. She had ultrasound performed that showed irregular mass at the 5:00 o'clock position 3 cm from the nipple measuring 1.1 cm. Needle core biopsy performed on 06/19/2012 showed invasive ductal carcinoma with DCIS, grade 3 tumor that was ER negative PR negative HER-2/neu negative. Ki-67 was elevated at 34%. MRI of the breasts performed on 06/24/2012 the MRI showed in the middle third of the lower inner quadrant of the right breast a 1.5 x 1.3 x 1.9 cm irregular enhancing mass. No abnormal enhancement was seen in the left breast no enlarged axillary or internal mammary adenopathy was detected.   -She elected to have bilateral mastectomies. FHx positive for breast cancer in paternal grandmother and paternal aunt. She is BRCA 1/2 negative. Path on right breast 2.3 cm invasive ductal carcinoma 0/1 lymph nodes were positive for metastatic disease. Tumor was grade 3 ER 2% PR negative HER-2/neu negative with a Ki-67 at 34%. Path of left breast showed intraductal papilloma node atypia or malignancy was found.  -status post adjuvant chemotherapy since her tumor is essentially triple negative and is a stage II. status post dose-dense AC X 4 cycles.  -status post adjuvant Taxol and carboplatinum every week for a total of 12 weeks. -status post silicon implants bilaterally. -patient was started on Arimidex on 05/19/13 after discussing with Dr. Humphrey Rolls since her tumor is 2% ER positive. -bone  prophylaxis (has osteoporosis on her bone density from 04/08/13) and is taking Fosamax every week.    She has intermittent hot flashes that are tolerable. Not worsening.  She complaints of nausea without emesis that is mild. Occurs 2 a month typically on the weekend. Has occasional HAs which is her norm. After further discussion, she feels that it may be possible that her nausea is related to fosamax. She has low libido and complaints of dyspareunia.  She has had GYN procedures for VIN3 and CIN2.  She has peripheral neuropathy that is stable and unchanged.  She continues on xarelto for right lower extremity DVT. Her DVT was unprovoked in 03/2013.  She started to get right lower leg pains and mild swelling in  prior to her road trip to [place]. DVT was discovered in [facility] involving the posterior tibial, peroneal and femoral veins.  She was anticoaguluated with resolution of symptoms. Most recent ultrasound 11/10/2013 show chronic DVT involving the right popliteal vein. Currently her lower extremities are not edematous. She maintains that her legs could swells was the end of the day. She has no pain.  ROS:  Negative for respiratory, negative for dermatology, positive for neurology as in HPI, negative for cardiovascular, negative for gastroenterology, negative for endocrine, negative for ID, positive for genitourinary as in HPI, negative for mental health  MEDICAL HISTORY: Past Medical History  Diagnosis Date  . Thyroid disease   . Depression   . Sprue   . Breast cancer     right  . Hypothyroidism   . DVT (deep venous thrombosis) 2014    Right lower extremity, thigh  .  Wears glasses   . Pneumonia     hx of years ago  . History of chemotherapy   . Neuropathy     of fingers and toes    ALLERGIES:  is allergic to ciprofloxacin and gluten meal.  MEDICATIONS:  Current Outpatient Prescriptions  Medication Sig Dispense Refill  . alendronate (FOSAMAX) 70 MG tablet TAKE 1 TABLET ONCE A WEEK WITH A  FULL GLASS OF WATER ON AN EMPTY STOMACH  12 tablet  0  . anastrozole (ARIMIDEX) 1 MG tablet TAKE 1 TABLET (1 MG TOTAL) BY MOUTH DAILY.  90 tablet  0  . gabapentin (NEURONTIN) 100 MG capsule Take 2 capsules (200 mg total) by mouth 3 (three) times daily.  540 capsule  3  . levothyroxine (SYNTHROID, LEVOTHROID) 25 MCG tablet TAKE 1 TABLET EVERY DAY  90 tablet  3  . liothyronine (CYTOMEL) 25 MCG tablet Take 12.5 mcg by mouth daily.      . Multiple Vitamin (MULTIVITAMIN) tablet Take 1 tablet by mouth daily.      . prochlorperazine (COMPAZINE) 10 MG tablet Take 10 mg by mouth every 6 (six) hours as needed for nausea or vomiting.      . Rivaroxaban (XARELTO PO) Take 1 tablet by mouth every morning.      . venlafaxine XR (EFFEXOR-XR) 75 MG 24 hr capsule Take 75 mg by mouth daily with breakfast.      . B Complex-C (SUPER B COMPLEX PO) Take 1 tablet by mouth daily.      . valACYclovir (VALTREX) 500 MG tablet        No current facility-administered medications for this visit.    SURGICAL HISTORY:  Past Surgical History  Procedure Laterality Date  . Esophagogastroduodenoscopy  2007    sprue  . Colonoscopy  2006  . Cryoblation of cervix      long time ago  . Breast lumpectomy    . Total mastectomy Bilateral 09/11/2012    Procedure: bilateral MASTECTOMY;  Surgeon: Odis Hollingshead, MD;  Location: [facility];  Service: General;  Laterality: Bilateral;  . Axillary sentinel node biopsy Right 09/11/2012    Procedure: AXILLARY SENTINEL lymph NODE  BIOPSY;  Surgeon: Odis Hollingshead, MD;  Location: [facility];  Service: General;  Laterality: Right;  right nuclear medicine injection 12:30   . Breast reconstruction with placement of tissue expander and flex hd (acellular hydrated dermis) Bilateral 09/11/2012    Procedure: BREAST RECONSTRUCTION WITH PLACEMENT OF TISSUE EXPANDER AND FLEX HD (ACELLULAR HYDRATED DERMIS) ADM;  Surgeon: Theodoro Kos, DO;  Location: [place];  Service: Plastics;  Laterality: Bilateral;  .  Incision and drainage of wound Left 09/16/2012    Procedure: Left Breast Evacuation of Hematoma;  Surgeon: Theodoro Kos, DO;  Location: [facility];  Service: Plastics;  Laterality: Left;  . Portacath placement Right 10/09/2012    Procedure: US GUIDED INSERTION PORT-A-CATH;  Surgeon: Odis Hollingshead, MD;  Location: [place];  Service: General;  Laterality: Right;  Right Subclavian Vein  . Removal of bilateral tissue expanders with placement of bilateral breast implants Bilateral 06/24/2013    Procedure: REMOVAL OF BILATERAL TISSUE EXPANDERS WITH PLACEMENT OF BILATERAL BREAST IMPLANTS;  Surgeon: Theodoro Kos, DO;  Location: [place];  Service: Plastics;  Laterality: Bilateral;  . Vulvectomy N/A 07/14/2013    Procedure: WIDE LOCAL  EXCISION VULVAR;  Surgeon: Alvino Chapel, MD;  Location: [facility];  Service: Gynecology;  Laterality: N/A;  . Port-a-cath removal Right 08/07/2013  Procedure: REMOVAL PORT-A-CATH;  Surgeon: Odis Hollingshead, MD;  Location: [place] [place];  Service: General;  Laterality: Right;  . Breast reconstruction Right 09/24/2013    Procedure: REVISION OF RIGHT BREAST RECONSTRUCTION WITH REPOSITIONING RIGHT IMPLANT, POSSIBLE EXCISION CAPSULAR CONTRACTURE AND LIPOFILLING FOR FAT GRAFTING;  Surgeon: Theodoro Kos, DO;  Location: [place];  Service: Plastics;  Laterality: Right;  . Liposuction with lipofilling Bilateral 09/24/2013    Procedure: LIPOSUCTION WITH LIPOFILLING;  Surgeon: Theodoro Kos, DO;  Location: [place];  Service: Plastics;  Laterality: Bilateral;  Biltalteal filling breast   PHYSICAL EXAMINATION: Blood pressure 117/75, pulse 86, temperature 97.8 F (36.6 C), temperature source Oral, resp. rate 18, height $RemoveBe'5\' 6"'[id number]$  (1.676 m), weight 152 lb 14.4 oz (69.355 kg), last menstrual period 09/11/2012. Body mass index is 24.69 kg/(m^2). General: Patient is a well appearing female in no acute  distress HEENT: PERRLA, sclerae anicteric no conjunctival pallor, MMM Lymph nodes: no palpable adenopathy within the cervical, supraclavicular, infracular and axillary regions. Lungs: clear to auscultation bilaterally, no wheezes, rhonchi, or rales Cardiovascular: regular rate rhythm, S1, S2, no murmurs, rubs or gallops Abdomen: Soft, non-tender, non-distended, normoactive bowel sounds, no HSM Extremities: warm and well perfused, no clubbing, cyanosis, or edema Skin: No rashes or lesions. Cranial nerves: no gross focal deficits Breast examination: Bilateral breast implants noted. No bilateral axillary lymphadenopathy appreciated  ECOG PERFORMANCE STATUS: 1 - Symptomatic but completely ambulatory  LABORATORY DATA: Lab Results  Component Value Date   WBC 5.4 12/10/2013   HGB 13.4 12/10/2013   HCT 40.9 12/10/2013   MCV 86.4 12/10/2013   PLT 240 12/10/2013      Chemistry      Component Value Date/Time   NA 141 12/10/2013 0950   NA 142 08/05/2013 1206   K 4.3 12/10/2013 0950   K 3.8 08/05/2013 1206   CL 102 08/05/2013 1206   CL 105 01/06/2013 0827   CO2 22 12/10/2013 0950   CO2 25 08/05/2013 1206   BUN 10.4 12/10/2013 0950   BUN 11 08/05/2013 1206   CREATININE 0.7 12/10/2013 0950   CREATININE 0.54 08/05/2013 1206      Component Value Date/Time   CALCIUM 9.2 12/10/2013 0950   CALCIUM 9.2 08/05/2013 1206   ALKPHOS 162* 12/10/2013 0950   ALKPHOS 163* 12/04/2013 1616   AST 31 12/10/2013 0950   AST 51* 12/04/2013 1616   ALT 43 12/10/2013 0950   ALT 74* 12/04/2013 1616   BILITOT 0.58 12/10/2013 0950   BILITOT 0.8 12/04/2013 1616     ADDITIONAL INFORMATION: 3. CHROMOGENIC IN-SITU HYBRIDIZATION Interpretation HER-2/NEU BY CISH - NO AMPLIFICATION OF HER-2 DETECTED. THE RATIO OF HER-2: CEP 17 SIGNALS WAS 1.52. Reference range: Ratio: HER2:CEP17 < 1.8 - gene amplification not observed Ratio: HER2:CEP 17 1.8-2.2 - equivocal result Ratio: HER2:CEP17 > 2.2 - gene amplification observed Enid Cutter  MD Pathologist, Electronic Signature ( Signed 09/22/2012) 3. PROGNOSTIC INDICATORS - ACIS Results IMMUNOHISTOCHEMICAL AND MORPHOMETRIC ANALYSIS BY THE AUTOMATED CELLULAR IMAGING SYSTEM (ACIS) Estrogen Receptor (Negative, <1%): 2%, POSITIVE, WEAK STAINING INTENSITY Progesterone Receptor (Negative, <1%): 0%, NEGATIVE COMMENT: The negative hormone receptor study in this case has an internal positive control. All controls stained appropriately Enid Cutter MD Pathologist, Electronic Signature  IMPRESSION/REPORT/PLAN:   1) Invasive ductal carcinoma of the right breast status post bilateral mastectomies for a 2.3 cm node negative essentially triple negative disease on the right. Without any evidence of malignancy on the left. Patient underwent 4 cycles of Adriamycin/Cytoxan  followed by 12 cycles of Taxol/Carboplatin.  Since her tumor was 2% estrogen receptor positive we will utilize an aromatase inhibitor for 5 years.  She started Arimidex on 05/19/13.    She will continue on Arimidex. It is unlikely that this is causing her nausea which is infrequent. She will take note if her nausea is related to when she takes Fosamax.  She has had bilateral mastectomies with silicone implants. No need for annual mammogram.  2) Right lower extremity DVT: on  Xarelto Ultrasound show residual vein occlusion with chronic DVT. She had an unprovoked DVT back in September of 2014. She had a clinical symptoms of a DVT prior to her trip to [place].   3) Hot flashes Patient is already on Effexor for anxiety and 75 mg XR a day. She could increase her Effexor up to 150 mg a day to see if her hot flashes can be further alleviated.   4) Dyspareunia  We discussed issues with vaginal atrophy. I do not recommend vaginal estrogen ring while on an aromatase inhibitor. However, topical testosterone could be an option. Other reason for her dyspareunia include recovery/scar from perineal wide local excision on 07/14/2013.  She  will discuss this further with her gyn.  4) Peripheral Neuropathy   5)Osteoporosis:  On Fosamax every week.  Last Bone density was on 04/08/13.  She also takes Calcium, Vitamin D, and does weight bearing exercises.   6) Elevated liver functions:  Normalized today's labs  7) Wide local excision of the perineum for  VIN3 on 07/14/2013. Final pathology revealed CIN-2 with negative margins.    Multiple questions answered  Total time 30 minutes with more than 50% spent in counseling.  Dr. Doristine Church

## 2013-12-11 ENCOUNTER — Telehealth: Payer: Self-pay | Admitting: Internal Medicine

## 2013-12-11 ENCOUNTER — Other Ambulatory Visit: Payer: Self-pay | Admitting: *Deleted

## 2013-12-11 DIAGNOSIS — C50319 Malignant neoplasm of lower-inner quadrant of unspecified female breast: Secondary | ICD-10-CM

## 2013-12-11 MED ORDER — VENLAFAXINE HCL ER 75 MG PO CP24: 75.0000 mg | ORAL_CAPSULE | Freq: Every day | ORAL | Status: DC

## 2013-12-11 MED ORDER — VENLAFAXINE HCL ER 75 MG PO CP24: 150.0000 mg | ORAL_CAPSULE | Freq: Every day | ORAL | Status: DC

## 2013-12-11 NOTE — Telephone Encounter (Signed)
[id number] - [place], [place] [address] is requesting a 90 day re-fill on venlafaxine XR (EFFEXOR-XR) 75 MG 24 hr capsule.  States pt's insurance requires 90 days.

## 2013-12-11 NOTE — Telephone Encounter (Signed)
Effexor filled by Dr. Celso Amy.  Patient reported to pharmacy that she was told to take 2 tabs daily.  Noted 12-10-2013 office note by Dr. Burke Keels that patient may take two pills and order sent.

## 2013-12-11 NOTE — Telephone Encounter (Signed)
rx sent in electronically 

## 2013-12-14 ENCOUNTER — Telehealth: Payer: Self-pay | Admitting: Oncology

## 2013-12-14 NOTE — Telephone Encounter (Signed)
s.w. pt and advised on OCCT appt....pt ok adn aware

## 2014-01-13 ENCOUNTER — Telehealth: Payer: Self-pay | Admitting: Internal Medicine

## 2014-01-13 MED ORDER — LIOTHYRONINE SODIUM 25 MCG PO TABS: 12.5000 ug | ORAL_TABLET | Freq: Every day | ORAL | Status: DC

## 2014-01-13 NOTE — Telephone Encounter (Signed)
[id number] - [place], [place] - [address] is requesting re-fill on liothyronine (CYTOMEL) 25 MCG tablet

## 2014-01-13 NOTE — Telephone Encounter (Signed)
rx sent in electronically 

## 2014-01-17 ENCOUNTER — Other Ambulatory Visit: Payer: Self-pay | Admitting: Oncology

## 2014-01-17 DIAGNOSIS — C50311 Malignant neoplasm of lower-inner quadrant of right female breast: Secondary | ICD-10-CM

## 2014-02-22 ENCOUNTER — Other Ambulatory Visit (HOSPITAL_COMMUNITY)
Admission: RE | Admit: 2014-02-22 | Discharge: 2014-02-22 | Disposition: A | Discharge status: Home / Self Care | Payer: Managed Care, Other (non HMO) | Source: Ambulatory Visit | Attending: Hematology and Oncology | Admitting: Hematology and Oncology

## 2014-02-22 ENCOUNTER — Other Ambulatory Visit: Payer: Self-pay | Admitting: Adult Health

## 2014-02-22 ENCOUNTER — Encounter: Payer: Self-pay | Admitting: Hematology and Oncology

## 2014-02-22 ENCOUNTER — Ambulatory Visit (HOSPITAL_BASED_OUTPATIENT_CLINIC_OR_DEPARTMENT_OTHER): Payer: Managed Care, Other (non HMO) | Admitting: Hematology and Oncology

## 2014-02-22 ENCOUNTER — Ambulatory Visit (HOSPITAL_COMMUNITY)
Admission: RE | Admit: 2014-02-22 | Discharge: 2014-02-22 | Disposition: A | Discharge status: Home / Self Care | Payer: Managed Care, Other (non HMO) | Source: Ambulatory Visit | Attending: Adult Health | Admitting: Adult Health

## 2014-02-22 ENCOUNTER — Telehealth: Payer: Self-pay | Admitting: *Deleted

## 2014-02-22 ENCOUNTER — Telehealth: Payer: Self-pay | Admitting: Hematology and Oncology

## 2014-02-22 ENCOUNTER — Telehealth: Payer: Self-pay

## 2014-02-22 VITALS — BP 140/75 | HR 88 | Temp 98.0°F | Resp 20 | Ht 66.0 in | Wt 156.7 lb

## 2014-02-22 DIAGNOSIS — H539 Unspecified visual disturbance: Secondary | ICD-10-CM | POA: Insufficient documentation

## 2014-02-22 DIAGNOSIS — Z853 Personal history of malignant neoplasm of breast: Secondary | ICD-10-CM

## 2014-02-22 DIAGNOSIS — R51 Headache: Secondary | ICD-10-CM | POA: Diagnosis not present

## 2014-02-22 DIAGNOSIS — R519 Headache, unspecified: Secondary | ICD-10-CM

## 2014-02-22 DIAGNOSIS — C50919 Malignant neoplasm of unspecified site of unspecified female breast: Secondary | ICD-10-CM | POA: Insufficient documentation

## 2014-02-22 DIAGNOSIS — C50319 Malignant neoplasm of lower-inner quadrant of unspecified female breast: Secondary | ICD-10-CM

## 2014-02-22 DIAGNOSIS — R930 Abnormal findings on diagnostic imaging of skull and head, not elsewhere classified: Secondary | ICD-10-CM | POA: Diagnosis not present

## 2014-02-22 DIAGNOSIS — Z79811 Long term (current) use of aromatase inhibitors: Secondary | ICD-10-CM

## 2014-02-22 DIAGNOSIS — M810 Age-related osteoporosis without current pathological fracture: Secondary | ICD-10-CM

## 2014-02-22 MED ORDER — GADOBENATE DIMEGLUMINE 529 MG/ML IV SOLN
14.0000 mL | Freq: Once | INTRAVENOUS | Status: AC | PRN
  Administered 2014-02-22: 14 mL via INTRAVENOUS

## 2014-02-22 NOTE — Progress Notes (Signed)
Patient Care Team: Ricard Dillon, MD as PCP - General  DIAGNOSIS: Cancer of lower-inner quadrant of female breast   Primary site: Breast (Right)   Staging method: AJCC 7th Edition   Clinical: Stage IA (T1c, N0, cM0)   Summary: Stage IA (T1c, N0, cM0)   Clinical comments: Staged at breast conference 12.18.13   SUMMARY OF ONCOLOGIC HISTORY:   Cancer of lower-inner quadrant of female breast   06/18/2012 Mammogram screening mammogram in 06/2012 that showed a spiculated mass in the lower inner quadrant of the right breast. She had ultrasound performed that showed irregular mass at the 5:00 o'clock position 3 cm from the nipple measuring 1.1 cm.   06/19/2012 Initial Biopsy invasive ductal carcinoma with DCIS, grade 3 tumor that was ER negative PR negative HER-2/neu negative. Ki-67 was elevated at 34%   06/24/2012 Breast MRI middle third of the lower inner quadrant of the right breast a 1.5 x 1.3 x 1.9 cm irregular enhancing mass.    06/24/2012 Initial Diagnosis Cancer of lower-inner quadrant of female breast   09/11/2012 Surgery Bilateral mastectomies   10/15/2012 - 03/03/2013 Chemotherapy AC X 4 followed by Carbo-Taxol weekly X 12   05/19/2013 -  Anti-estrogen oral therapy Arimidex    CHIEF COMPLIANT:  Severe headaches for 2 weeks INTERVAL HISTORY:  [name] is here for an acute visit having experienced severe headaches for the past 2 weeks most of the headache is focused on the right side of the head this is associated with flashes of light she denies any migraine-like aura symptoms denies any nausea or vomiting no other neurological symptoms. Her behavior, motor strength and sensation are all intact. she denies any unconsciousness or difficulties with gait or loss of function.  REVIEW OF SYSTEMS:   Constitutional: Denies fevers, chills or abnormal weight loss Eyes: Denies blurriness of vision Ears, nose, mouth, throat, and face: Denies mucositis or sore throat Respiratory: Denies  cough, dyspnea or wheezes Cardiovascular: Denies palpitation, chest discomfort or lower extremity swelling Gastrointestinal:  Denies nausea, heartburn or change in bowel habits Skin: Denies abnormal skin rashes Lymphatics: Denies new lymphadenopathy or easy bruising Neurological:Denies numbness, tingling or new weaknesses Behavioral/Psych: Mood is stable, no new changes  All other systems were reviewed with the patient and are negative.  I have reviewed the past medical history, past surgical history, social history and family history with the patient and they are unchanged from previous note.  ALLERGIES:  is allergic to ciprofloxacin and gluten meal.  MEDICATIONS:  Current Outpatient Prescriptions  Medication Sig Dispense Refill  . alendronate (FOSAMAX) 70 MG tablet TAKE 1 TABLET ONCE A WEEK WITH A FULL GLASS OF WATER ON AN EMPTY STOMACH  12 tablet  0  . anastrozole (ARIMIDEX) 1 MG tablet TAKE 1 TABLET BY MOUTH EVERY DAY  90 tablet  1  . B Complex-C (SUPER B COMPLEX PO) Take 1 tablet by mouth daily.      [name] gabapentin (NEURONTIN) 100 MG capsule Take 2 capsules (200 mg total) by mouth 3 (three) times daily.  540 capsule  3  . levothyroxine (SYNTHROID, LEVOTHROID) 25 MCG tablet TAKE 1 TABLET EVERY DAY  90 tablet  3  . liothyronine (CYTOMEL) 25 MCG tablet Take 0.5 tablets (12.5 mcg total) by mouth daily.  45 tablet  3  . Multiple Vitamin (MULTIVITAMIN) tablet Take 1 tablet by mouth daily.      . prochlorperazine (COMPAZINE) 10 MG tablet Take 10 mg by mouth every 6 (six) hours as  needed for nausea or vomiting.      . Rivaroxaban (XARELTO PO) Take 1 tablet by mouth every morning.      . valACYclovir (VALTREX) 500 MG tablet       . venlafaxine XR (EFFEXOR-XR) 75 MG 24 hr capsule Take 2 capsules (150 mg total) by mouth daily with breakfast.  180 capsule  5   No current facility-administered medications for this visit.    PHYSICAL EXAMINATION: ECOG PERFORMANCE STATUS: 1 - Symptomatic but  completely ambulatory  Filed Vitals:   02/22/14 1532  BP: 140/75  Pulse: 88  Temp: 98 F (36.7 C)  Resp: 20   Filed Weights   02/22/14 1532  Weight: 156 lb 11.2 oz (71.079 kg)    GENERAL:alert, no distress and comfortable SKIN: skin color, texture, turgor are normal, no rashes or significant lesions EYES: normal, Conjunctiva are pink and non-injected, sclera clear OROPHARYNX:no exudate, no erythema and lips, buccal mucosa, and tongue normal  NECK: supple, thyroid normal size, non-tender, without nodularity LYMPH:  no palpable lymphadenopathy in the cervical, axillary or inguinal LUNGS: clear to auscultation and percussion with normal breathing effort HEART: regular rate & rhythm and no murmurs and no lower extremity edema ABDOMEN:abdomen soft, non-tender and normal bowel sounds Musculoskeletal:no cyanosis of digits and no clubbing  NEURO: alert & oriented x 3 with fluent speech, no focal motor/sensory deficits.    LABORATORY DATA:  I have reviewed the data as listed No visits with results within 1 Month(s) from this visit. Latest known visit with results is:  Appointment on 12/10/2013  Component Date Value Ref Range Status  . Sodium 12/10/2013 141  136 - 145 mEq/L Final  . Potassium 12/10/2013 4.3  3.5 - 5.1 mEq/L Final  . Chloride 12/10/2013 108  98 - 109 mEq/L Final  . CO2 12/10/2013 22  22 - 29 mEq/L Final  . Glucose 12/10/2013 103  70 - 140 mg/dl Final  . BUN 12/10/2013 10.4  7.0 - 26.0 mg/dL Final  . Creatinine 12/10/2013 0.7  0.6 - 1.1 mg/dL Final  . Total Bilirubin 12/10/2013 0.58  0.20 - 1.20 mg/dL Final  . Alkaline Phosphatase 12/10/2013 162* 40 - 150 U/L Final  . AST 12/10/2013 31  5 - 34 U/L Final  . ALT 12/10/2013 43  0 - 55 U/L Final  . Total Protein 12/10/2013 7.0  6.4 - 8.3 g/dL Final  . Albumin 12/10/2013 3.9  3.5 - 5.0 g/dL Final  . Calcium 12/10/2013 9.2  8.4 - 10.4 mg/dL Final  . Anion Gap 12/10/2013 11  3 - 11 mEq/L Final  . WBC 12/10/2013 5.4   3.9 - 10.3 10e3/uL Final  . NEUT# 12/10/2013 2.5  1.5 - 6.5 10e3/uL Final  . HGB 12/10/2013 13.4  11.6 - 15.9 g/dL Final  . HCT 12/10/2013 40.9  34.8 - 46.6 % Final  . Platelets 12/10/2013 240  145 - 400 10e3/uL Final  . MCV 12/10/2013 86.4  79.5 - 101.0 fL Final  . MCH 12/10/2013 28.3  25.1 - 34.0 pg Final  . MCHC 12/10/2013 32.8  31.5 - 36.0 g/dL Final  . RBC 12/10/2013 4.74  3.70 - 5.45 10e6/uL Final  . RDW 12/10/2013 15.1* 11.2 - 14.5 % Final  . lymph# 12/10/2013 2.0  0.9 - 3.3 10e3/uL Final  . MONO# 12/10/2013 0.6  0.1 - 0.9 10e3/uL Final  . Eosinophils Absolute 12/10/2013 0.1  0.0 - 0.5 10e3/uL Final  . Basophils Absolute 12/10/2013 0.0  0.0 - 0.1 10e3/uL Final  .  NEUT% 12/10/2013 47.4  38.4 - 76.8 % Final  . LYMPH% 12/10/2013 38.1  14.0 - 49.7 % Final  . MONO% 12/10/2013 11.4  0.0 - 14.0 % Final  . EOS% 12/10/2013 2.3  0.0 - 7.0 % Final  . BASO% 12/10/2013 0.8  0.0 - 2.0 % Final    RADIOGRAPHIC STUDIES: I have personally reviewed the radiological images as listed and agreed with the findings in the report. Mr Jeri Cos Wo Contrast  02/22/2014   CLINICAL DATA:  Breast cancer.  Acute headache.  Visual changes.  EXAM: MRI HEAD WITHOUT AND WITH CONTRAST  TECHNIQUE: Multiplanar, multiecho pulse sequences of the brain and surrounding structures were obtained without and with intravenous contrast.  CONTRAST:  37m MULTIHANCE GADOBENATE DIMEGLUMINE 529 MG/ML IV SOLN  COMPARISON:  None.  FINDINGS: Diffuse dural on left at meningeal enhancement is present bilaterally. No discrete parenchymal lesion is evident.  No acute infarct, hemorrhage, or mass lesion is present. At least 3 separate subcortical T2 hyperintensities are noted without associated enhancement.  No hemorrhage or mass lesion is present.  Flow is present in the major intracranial arteries. The globes and orbits are intact. The paranasal sinuses and mastoid air cells are clear.  IMPRESSION: 1. Diffuse dural amount of meningeal  enhancement. This raises concern for meningitis or left at meningeal metastases less likely. Recommend lumbar puncture. 2. No acute parenchymal abnormality. 3. Minimal subcortical white matter changes are likely within normal limits for age.   Electronically Signed   By: CLawrence SantiagoM.D.   On: 02/22/2014 14:17     ASSESSMENT & PLAN:  Cancer of lower-inner quadrant of female breast Patient is tolerating Arimidex without any major problems or concerns. Her next bone density test we need to be scheduled sometime in September she has existing osteoporosis and continues to be on Fosamax once a week  Osteoporosis, unspecified I continue with Fosamax once a week and bone density will be scheduled for the month of September 2015  Acute Headaches: MRI brain showing signs of diffuse meningeal enhancement. Will perform LP on Thursday. Patient has no fevers to suggest meningitis and no meningeal signs as well.  DVT: On Xarelto. Will hold it for LP.  The patient has a good understanding of the overall plan. she agrees with it. She will call with any problems that may develop before her next visit here.  I spent 40 minutes counseling the patient face to face. The total time spent in the appointment was 60 minutes and more than 50% was on counseling and review of test results    GRulon Eisenmenger MD 02/22/2014 6:45 PM

## 2014-02-22 NOTE — Telephone Encounter (Signed)
s/w pt today re appt for mri @ [facility] 1pm today and [facility] @ 3pm today.

## 2014-02-22 NOTE — Assessment & Plan Note (Signed)
Patient is tolerating Arimidex without any major problems or concerns. Her next bone density test we need to be scheduled sometime in September she has existing osteoporosis and continues to be on Fosamax once a week

## 2014-02-22 NOTE — Telephone Encounter (Signed)
VERBAL ORDER AND READ BACK TO LINDSEY CORNETTO,NP- PT. TO SEE [place] AT 1:00PM. NOTIFIED PT. SHE VOICES UNDERSTANDING.

## 2014-02-22 NOTE — Telephone Encounter (Signed)
   Provider input needed:PT.'S PAIN IS THE BACK OF HER HEAD TOWARD THE TOP LEFT SIDE.   Reason for call: PT.'S PAIN IS INTERMITTED AND DULL AT A SCALE OF FIVE.   Neurological: positive for headaches and BLURRED VISION IN LEFT EYE.   ALLERGIES:  is allergic to ciprofloxacin and gluten meal.  Patient last received chemotherapy/ treatment on PT. STARTED ANASTROZOLE ON 05/19/13.  Patient was last seen in the office on 08/12/13    Next appt is 04/16/14  Is patient having fevers greater than 100.5?  no   Is patient having uncontrolled pain, or new pain? yes, NEW PAIN   Is patient having new back pain that changes with position (worsens or eases when laying down?)  no   Is patient able to eat and drink? yes    Is patient able to pass stool without difficulty?   N/A     Is patient having uncontrolled nausea?  NO NAUSEA OR VOMITING.    patient calls 02/22/2014 with complaint of HEADACHE AND BLURRED VISION. SHE HAS TAKEN TYLENOL WHICH DECREASES HER PAIN TO A SCALE OF TWO.   Summary Based on the above information advised patient to AWAIT A RETURN CALL.   Nancy Beard M  02/22/2014, 10:26 AM   Background Info  KAISA WOFFORD   DOB: 1955-07-25   MR#: [id number]   CSN#   [id number] 8/10/2015P

## 2014-02-22 NOTE — Telephone Encounter (Signed)
Pt reports she took xarelto this morning.  Let pt know she needs to hold xarelto and not resume until after lumbar puncture on Thursday.  Pt to come in Thursday at 830 am for LP in office.  Pt voiced understanding.  POF sent. Inbox sent to [facility] for LP tray.

## 2014-02-22 NOTE — Telephone Encounter (Signed)
added appt for 8/13. d/t/pt aware per 8/10 pof. central will pt w/appt for lumbar puncture.

## 2014-02-22 NOTE — Assessment & Plan Note (Signed)
I continue with Fosamax once a week and bone density will be scheduled for the month of September 2015

## 2014-02-23 ENCOUNTER — Telehealth: Payer: Self-pay | Admitting: Hematology and Oncology

## 2014-02-23 NOTE — Telephone Encounter (Signed)
added f/u w/VG 8/13 @ 8:30am per 8/10 pof. per pof pt aware. lmonvm for desk nurse asking if pt needs lab and if pt needs labs due to a lab meeting 8/13 in the morning pt will not be able to have lab draw until after seeing VG.

## 2014-02-24 ENCOUNTER — Ambulatory Visit (HOSPITAL_COMMUNITY): Payer: Managed Care, Other (non HMO)

## 2014-02-25 ENCOUNTER — Telehealth: Payer: Self-pay | Admitting: Hematology and Oncology

## 2014-02-25 ENCOUNTER — Ambulatory Visit (HOSPITAL_BASED_OUTPATIENT_CLINIC_OR_DEPARTMENT_OTHER): Payer: Managed Care, Other (non HMO) | Admitting: Hematology and Oncology

## 2014-02-25 ENCOUNTER — Ambulatory Visit (HOSPITAL_COMMUNITY)
Admission: RE | Admit: 2014-02-25 | Discharge: 2014-02-25 | Disposition: A | Discharge status: Home / Self Care | Payer: Managed Care, Other (non HMO) | Source: Ambulatory Visit | Attending: Hematology and Oncology | Admitting: Hematology and Oncology

## 2014-02-25 VITALS — BP 113/75 | HR 78 | Temp 98.3°F | Resp 18 | Ht 66.0 in | Wt 153.9 lb

## 2014-02-25 DIAGNOSIS — C50319 Malignant neoplasm of lower-inner quadrant of unspecified female breast: Secondary | ICD-10-CM | POA: Insufficient documentation

## 2014-02-25 DIAGNOSIS — R51 Headache: Secondary | ICD-10-CM

## 2014-02-25 DIAGNOSIS — R519 Headache, unspecified: Secondary | ICD-10-CM

## 2014-02-25 LAB — CSF CELL COUNT WITH DIFFERENTIAL
RBC Count, CSF: 2 /mm3 — ABNORMAL HIGH
Tube #: 2
WBC, CSF: 1 /mm3 (ref 0–5)

## 2014-02-25 LAB — PROTEIN AND GLUCOSE, CSF
Glucose, CSF: 70 mg/dL (ref 43–76)
Total  Protein, CSF: 29 mg/dL (ref 15–45)

## 2014-02-25 NOTE — Progress Notes (Signed)
Lumbar puncture procedure note  Date of Procedure: 02/25/14 Indication: Meningeal enhancement on MRI brain Type of Anesthesia: Local 1% lidocaine  Consent has been obtained from the patient explaining the risks and benefits of the procedure including pain, bleeding and infection. Patient was placed in a sitting position leaning forward arching the back. Patient's name and date of birth were verified. The skin was sterilized with Betadine. 1% lidocaine was used to anesthetize the skin and subcutaneous tissues. The spinal needle was used to enter the L4-L5 intervertebral space and advanced to get clear CSF. Fluid was collected in specimen containers and labeled in the room to be sent for testing. 8 cc of CSF was obtained.The CSF was sent for cell count, cytology, glucose and protein and cultures. Patient will return next week to go over the results of the tests or sooner if the cell count suggests infection.  The needle was removed and pressure was placed the site of needle entry. Small Band-Aid was then placed over the site and patient was made to lay on the back or 15 minutes prior to discharge.   Patient was given instructions on avoiding exertional activities. Patient was instructed to call us if there was any signs and symptoms of bleeding or intractable headaches.  Complications: None Blood loss : None Disposition: Tolerated the procedure well and discharged home.  Signed Rulon Eisenmenger, MD

## 2014-02-25 NOTE — Telephone Encounter (Signed)
per pof to sch pt appt-sch and gave pt sch

## 2014-02-26 ENCOUNTER — Other Ambulatory Visit: Payer: Self-pay

## 2014-02-28 LAB — CSF CULTURE W GRAM STAIN
Culture: NO GROWTH
Gram Stain: NONE SEEN

## 2014-03-02 ENCOUNTER — Ambulatory Visit (HOSPITAL_BASED_OUTPATIENT_CLINIC_OR_DEPARTMENT_OTHER): Payer: Managed Care, Other (non HMO) | Admitting: Hematology and Oncology

## 2014-03-02 ENCOUNTER — Other Ambulatory Visit (HOSPITAL_COMMUNITY): Payer: Self-pay | Admitting: Hematology and Oncology

## 2014-03-02 ENCOUNTER — Telehealth: Payer: Self-pay | Admitting: Hematology and Oncology

## 2014-03-02 VITALS — BP 128/82 | HR 82 | Temp 97.8°F | Resp 20 | Ht 66.0 in | Wt 156.4 lb

## 2014-03-02 DIAGNOSIS — M25561 Pain in right knee: Secondary | ICD-10-CM

## 2014-03-02 DIAGNOSIS — I82401 Acute embolism and thrombosis of unspecified deep veins of right lower extremity: Secondary | ICD-10-CM

## 2014-03-02 DIAGNOSIS — R51 Headache: Secondary | ICD-10-CM

## 2014-03-02 DIAGNOSIS — Z171 Estrogen receptor negative status [ER-]: Secondary | ICD-10-CM

## 2014-03-02 DIAGNOSIS — C50311 Malignant neoplasm of lower-inner quadrant of right female breast: Secondary | ICD-10-CM

## 2014-03-02 DIAGNOSIS — R519 Headache, unspecified: Secondary | ICD-10-CM

## 2014-03-02 DIAGNOSIS — C50319 Malignant neoplasm of lower-inner quadrant of unspecified female breast: Secondary | ICD-10-CM

## 2014-03-02 DIAGNOSIS — I82409 Acute embolism and thrombosis of unspecified deep veins of unspecified lower extremity: Secondary | ICD-10-CM

## 2014-03-02 NOTE — Progress Notes (Signed)
Patient Care Team: Ricard Dillon, MD as PCP - General  DIAGNOSIS: Cancer of lower-inner quadrant of female breast   Primary site: Breast (Right)   Staging method: AJCC 7th Edition   Clinical: Stage IA (T1c, N0, cM0)   Summary: Stage IA (T1c, N0, cM0)   Clinical comments: Staged at breast conference 12.18.13   SUMMARY OF ONCOLOGIC HISTORY:   Cancer of lower-inner quadrant of female breast   06/18/2012 Mammogram screening mammogram in 06/2012 that showed a spiculated mass in the lower inner quadrant of the right breast. She had ultrasound performed that showed irregular mass at the 5:00 o'clock position 3 cm from the nipple measuring 1.1 cm.   06/19/2012 Initial Biopsy invasive ductal carcinoma with DCIS, grade 3 tumor that was ER negative PR negative HER-2/neu negative. Ki-67 was elevated at 34%   06/24/2012 Breast MRI middle third of the lower inner quadrant of the right breast a 1.5 x 1.3 x 1.9 cm irregular enhancing mass.    06/24/2012 Initial Diagnosis Cancer of lower-inner quadrant of female breast   09/11/2012 Surgery Bilateral mastectomies   10/15/2012 - 03/03/2013 Chemotherapy AC X 4 followed by Carbo-Taxol weekly X 12   05/19/2013 -  Anti-estrogen oral therapy Arimidex    CHIEF COMPLIANT:  Headache slightly better INTERVAL HISTORY:  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 I performed a spinal tap a week ago and patient is here today accompanied by her husband discuss the results. Her headaches have improved significantly from a week ago but she continues to have neck discomfort intermittently. No other new complaints since last visit. Denies any fevers or chills.  REVIEW OF SYSTEMS:   Constitutional: Denies fevers, chills or abnormal weight loss Eyes: Denies blurriness of vision Ears, nose, mouth, throat, and face: Denies mucositis or sore  throat Respiratory: Denies cough, dyspnea or wheezes Cardiovascular: Denies palpitation, chest discomfort or lower extremity swelling Gastrointestinal:  Denies nausea, heartburn or change in bowel habits Skin: Denies abnormal skin rashes Lymphatics: Denies new lymphadenopathy or easy bruising Neurological:Denies numbness, tingling or new weaknesses Behavioral/Psych: Mood is stable, no new changes  All other systems were reviewed with the patient and are negative.  I have reviewed the past medical history, past surgical history, social history and family history with the patient and they are unchanged from previous note.  ALLERGIES:  is allergic to ciprofloxacin and gluten meal.  MEDICATIONS:  Current Outpatient Prescriptions  Medication Sig Dispense Refill  . alendronate (FOSAMAX) 70 MG tablet TAKE 1 TABLET ONCE A WEEK WITH A FULL GLASS OF WATER ON AN EMPTY STOMACH  12 tablet  0  . anastrozole (ARIMIDEX) 1 MG tablet TAKE 1 TABLET BY MOUTH EVERY DAY  90 tablet  1  . B Complex-C (SUPER B COMPLEX PO) Take 1 tablet by mouth daily.      [name] gabapentin (NEURONTIN) 100 MG capsule Take 2 capsules (200 mg total) by mouth 3 (three) times daily.  540 capsule  3  . levothyroxine (SYNTHROID, LEVOTHROID) 25 MCG tablet TAKE 1 TABLET EVERY DAY  90 tablet  3  . liothyronine (CYTOMEL) 25 MCG tablet Take 0.5 tablets (12.5 mcg total) by mouth daily.  45 tablet  3  . Multiple Vitamin (MULTIVITAMIN) tablet Take 1 tablet by mouth daily.      . prochlorperazine (COMPAZINE) 10 MG tablet Take 10 mg by mouth every 6 (six) hours as needed for  nausea or vomiting.      . Rivaroxaban (XARELTO PO) Take 1 tablet by mouth every morning.      . valACYclovir (VALTREX) 500 MG tablet       . venlafaxine XR (EFFEXOR-XR) 75 MG 24 hr capsule Take 2 capsules (150 mg total) by mouth daily with breakfast.  180 capsule  5   No current facility-administered medications for this visit.    PHYSICAL EXAMINATION: ECOG PERFORMANCE  STATUS: 1 - Symptomatic but completely ambulatory  Filed Vitals:   03/02/14 1038  BP: 128/82  Pulse: 82  Temp: 97.8 F (36.6 C)  Resp: 20   Filed Weights   03/02/14 1038  Weight: 156 lb 6.4 oz (70.943 kg)    GENERAL:alert, no distress and comfortable SKIN: skin color, texture, turgor are normal, no rashes or significant lesions EYES: normal, Conjunctiva are pink and non-injected, sclera clear OROPHARYNX:no exudate, no erythema and lips, buccal mucosa, and tongue normal  NECK: supple, thyroid normal size, non-tender, without nodularity LYMPH:  no palpable lymphadenopathy in the cervical, axillary or inguinal LUNGS: clear to auscultation and percussion with normal breathing effort HEART: regular rate & rhythm and no murmurs and no lower extremity edema ABDOMEN:abdomen soft, non-tender and normal bowel sounds Musculoskeletal:no cyanosis of digits and no clubbing  NEURO: alert & oriented x 3 with fluent speech, no focal motor/sensory deficits BREAST: No palpable masses lungs or nodules in either right or left breasts. No palpable axillary supraclavicular or infraclavicular adenopathy no breast tenderness or nipple discharge.   LABORATORY DATA:  I have reviewed the data as listed Hospital Outpatient Visit on 02/25/2014  Component Date Value Ref Range Status  . Tube # 02/25/2014 2   Final  . Color, CSF 02/25/2014 COLORLESS  COLORLESS Final  . Appearance, CSF 02/25/2014 CLEAR  CLEAR Final  . Supernatant 02/25/2014 NOT INDICATED   Final  . RBC Count, CSF 02/25/2014 2* 0 /cu mm Final  . WBC, CSF 02/25/2014 1  0 - 5 /cu mm Final  . Other Cells, CSF 02/25/2014 TOO FEW TO COUNT, SMEAR AVAILABLE FOR REVIEW   Final  . Glucose, CSF 02/25/2014 70  43 - 76 mg/dL Final  . Total  Protein, CSF 02/25/2014 29  15 - 45 mg/dL Final  . Specimen Description 02/25/2014 CSF   Final  . Special Requests 02/25/2014 NONE   Final  . Gram Stain 02/25/2014    Final                   Value:NO WBC SEEN                          NO ORGANISMS SEEN                         CYTOSPIN                         Performed at [facility]  . Culture 02/25/2014    Final                   Value:NO GROWTH 3 DAYS                         Performed at [facility]  . Report Status 02/25/2014 02/28/2014 FINAL   Final    RADIOGRAPHIC STUDIES: No results found.   DISEASE STAGE: Cancer of  lower-inner quadrant of female breast   Primary site: Breast (Right)   Staging method: AJCC 7th Edition   Clinical: Stage IA (T1c, N0, cM0)   Summary: Stage IA (T1c, N0, cM0)  ASSESSMENT & PLAN:  Cancer of lower-inner quadrant of female breast Right breast invasive ductal carcinoma triple negative disease T1 C. N0 M0 stage IA: No evidence of any problems with breast cancer standpoint.  DVT (deep venous thrombosis) When patient comes to see [place] in a month I would like to obtain an ultrasound of the leg as well as d-dimer. Patient was taken Xarelto for a year now and if both of those tests are negative I will discontinue Xarelto.  Persistent headaches I reviewed the results of the spinal tap there is no evidence of infection no evidence of meningitis. Glucose and protein are also normal the cytology was also normal. No evidence of any breast cancer. It does not help [place] explain the cause of meningeal increased uptake on MRI. If her symptoms do not get better in a month I might consider repeating the lumbar puncture. Since the last time her symptoms are also better the headaches are less often.   Orders Placed This Encounter  Procedures  . [place] Extrem Low Left Comp    Standing Status: Future     Number of Occurrences:      Standing Expiration Date: 05/03/2015    Order Specific Question:  Reason for Exam (SYMPTOM  OR DIAGNOSIS REQUIRED)    Answer:  DVT right leg    Order Specific Question:  Preferred imaging location?    Answer:  [facility]  . D-dimer, quantitative    Standing Status: Future      Number of Occurrences:      Standing Expiration Date: 03/02/2015   The patient has a good understanding of the overall plan. she agrees with it. She will call with any problems that may develop before her next visit here.  I spent 25 minutes counseling the patient face to face. The total time spent in the appointment was 30 minutes and more than 50% was on counseling and review of test results    Rulon Eisenmenger, MD 03/02/2014 1:20 PM

## 2014-03-02 NOTE — Assessment & Plan Note (Signed)
Right breast invasive ductal carcinoma triple negative disease T1 C. N0 M0 stage IA: No evidence of any problems with breast cancer standpoint.

## 2014-03-02 NOTE — Telephone Encounter (Signed)
, °

## 2014-03-02 NOTE — Assessment & Plan Note (Signed)
I reviewed the results of the spinal tap there is no evidence of infection no evidence of meningitis. Glucose and protein are also normal the cytology was also normal. No evidence of any breast cancer. It does not help [place] explain the cause of meningeal increased uptake on MRI. If her symptoms do not get better in a month I might consider repeating the lumbar puncture. Since the last time her symptoms are also better the headaches are less often.

## 2014-03-02 NOTE — Assessment & Plan Note (Signed)
When patient comes to see [place] in a month I would like to obtain an ultrasound of the leg as well as d-dimer. Patient was taken Xarelto for a year now and if both of those tests are negative I will discontinue Xarelto.

## 2014-03-14 ENCOUNTER — Other Ambulatory Visit: Payer: Self-pay | Admitting: Oncology

## 2014-03-14 DIAGNOSIS — I82409 Acute embolism and thrombosis of unspecified deep veins of unspecified lower extremity: Secondary | ICD-10-CM

## 2014-03-14 DIAGNOSIS — C50319 Malignant neoplasm of lower-inner quadrant of unspecified female breast: Secondary | ICD-10-CM

## 2014-03-22 ENCOUNTER — Ambulatory Visit (HOSPITAL_COMMUNITY): Payer: Managed Care, Other (non HMO)

## 2014-03-23 ENCOUNTER — Ambulatory Visit (HOSPITAL_COMMUNITY)
Admission: RE | Admit: 2014-03-23 | Discharge: 2014-03-23 | Disposition: A | Discharge status: Home / Self Care | Payer: Managed Care, Other (non HMO) | Source: Ambulatory Visit | Attending: Hematology and Oncology | Admitting: Hematology and Oncology

## 2014-03-23 ENCOUNTER — Other Ambulatory Visit (HOSPITAL_COMMUNITY): Payer: Self-pay | Admitting: Oncology

## 2014-03-23 DIAGNOSIS — I82409 Acute embolism and thrombosis of unspecified deep veins of unspecified lower extremity: Secondary | ICD-10-CM

## 2014-03-23 DIAGNOSIS — C50319 Malignant neoplasm of lower-inner quadrant of unspecified female breast: Secondary | ICD-10-CM | POA: Insufficient documentation

## 2014-03-23 DIAGNOSIS — M25569 Pain in unspecified knee: Secondary | ICD-10-CM | POA: Diagnosis present

## 2014-03-23 DIAGNOSIS — C50311 Malignant neoplasm of lower-inner quadrant of right female breast: Secondary | ICD-10-CM

## 2014-03-23 DIAGNOSIS — M25561 Pain in right knee: Secondary | ICD-10-CM

## 2014-03-23 NOTE — Progress Notes (Signed)
Right lower extremity venous duplex completed.  Right:  DVT noted in the popliteal vein.  No evidence of superficial thrombosis.  No Baker's cyst.  Left:  Negative for DVT in the common femoral vein.

## 2014-03-24 ENCOUNTER — Telehealth: Payer: Self-pay

## 2014-03-24 NOTE — Telephone Encounter (Signed)
RLE doppler - positive - still present.  Routes to Dr. Lindi Adie

## 2014-03-29 ENCOUNTER — Other Ambulatory Visit (HOSPITAL_BASED_OUTPATIENT_CLINIC_OR_DEPARTMENT_OTHER): Payer: Managed Care, Other (non HMO)

## 2014-03-29 ENCOUNTER — Telehealth: Payer: Self-pay | Admitting: Hematology and Oncology

## 2014-03-29 ENCOUNTER — Ambulatory Visit (HOSPITAL_BASED_OUTPATIENT_CLINIC_OR_DEPARTMENT_OTHER): Payer: Managed Care, Other (non HMO) | Admitting: Hematology and Oncology

## 2014-03-29 VITALS — BP 126/79 | HR 86 | Temp 97.7°F | Resp 18 | Ht 66.0 in | Wt 157.1 lb

## 2014-03-29 DIAGNOSIS — C50319 Malignant neoplasm of lower-inner quadrant of unspecified female breast: Secondary | ICD-10-CM

## 2014-03-29 DIAGNOSIS — I82409 Acute embolism and thrombosis of unspecified deep veins of unspecified lower extremity: Secondary | ICD-10-CM

## 2014-03-29 DIAGNOSIS — C50311 Malignant neoplasm of lower-inner quadrant of right female breast: Secondary | ICD-10-CM

## 2014-03-29 DIAGNOSIS — F3289 Other specified depressive episodes: Secondary | ICD-10-CM

## 2014-03-29 DIAGNOSIS — I82401 Acute embolism and thrombosis of unspecified deep veins of right lower extremity: Secondary | ICD-10-CM

## 2014-03-29 DIAGNOSIS — Z7901 Long term (current) use of anticoagulants: Secondary | ICD-10-CM

## 2014-03-29 DIAGNOSIS — F329 Major depressive disorder, single episode, unspecified: Secondary | ICD-10-CM

## 2014-03-29 LAB — D-DIMER, QUANTITATIVE: D-Dimer, Quant: <0.27 ug/mL-FEU (ref 0.00–0.48)

## 2014-03-29 NOTE — Telephone Encounter (Signed)
, °

## 2014-03-29 NOTE — Assessment & Plan Note (Signed)
DVT right leg: Ultrasound of the right leg recently showed persistent chronic DVT of the right popliteal vein. The remainder of the blood clots have disappeared. The d-dimer was negative but I discussed that because of this persistent blood clot in the right popliteal and the fact that she is on antiestrogen therapy, I would recommend continuing Xarelto for now. Our plan is to repeat another ultrasound in 6 months and reevaluate.

## 2014-03-29 NOTE — Progress Notes (Signed)
Patient Care Team: Ricard Dillon, MD as PCP - General  DIAGNOSIS: Cancer of lower-inner quadrant of female breast   Primary site: Breast (Right)   Staging method: AJCC 7th Edition   Clinical: Stage IA (T1c, N0, cM0)   Summary: Stage IA (T1c, N0, cM0)   Clinical comments: Staged at breast conference 12.18.13   SUMMARY OF ONCOLOGIC HISTORY:   Cancer of lower-inner quadrant of female breast   06/18/2012 Mammogram screening mammogram in 06/2012 that showed a spiculated mass in the lower inner quadrant of the right breast. She had ultrasound performed that showed irregular mass at the 5:00 o'clock position 3 cm from the nipple measuring 1.1 cm.   06/19/2012 Initial Biopsy invasive ductal carcinoma with DCIS, grade 3 tumor that was ER 2% PR negative HER-2/neu negative. Ki-67 was elevated at 34%   06/24/2012 Breast MRI middle third of the lower inner quadrant of the right breast a 1.5 x 1.3 x 1.9 cm irregular enhancing mass.    06/24/2012 Initial Diagnosis Cancer of lower-inner quadrant of female breast   09/11/2012 Surgery Bilateral mastectomies   10/15/2012 - 03/03/2013 Chemotherapy AC X 4 followed by Carbo-Taxol weekly X 12   05/19/2013 -  Anti-estrogen oral therapy Arimidex    CHIEF COMPLIANT: Patient is here to followup on the ultrasound results.  INTERVAL HISTORY: Nancy Beard is a 58 year old Caucasian with above-mentioned history of breast cancer that is mildly ER positive and is on antiestrogen therapy. She has a history of blood clots for which is on Xarelto. We obtained ultrasound of the leg and d-dimer and she is here today to discuss both of those results. Her major complaint of antiestrogen therapy is a hot flashes. She is already on Effexor and gabapentin.   REVIEW OF SYSTEMS:   Constitutional: Denies fevers, chills or abnormal weight loss Eyes: Denies blurriness of vision Ears, nose, mouth, throat, and face: Denies mucositis or sore throat Respiratory: Denies cough, dyspnea or  wheezes Cardiovascular: Denies palpitation, chest discomfort or lower extremity swelling Gastrointestinal:  Denies nausea, heartburn or change in bowel habits Skin: Denies abnormal skin rashes Lymphatics: Denies new lymphadenopathy or easy bruising Neurological:Denies numbness, tingling or new weaknesses Behavioral/Psych: Mood is stable, no new changes  All other systems were reviewed with the patient and are negative.  I have reviewed the past medical history, past surgical history, social history and family history with the patient and they are unchanged from previous note.  ALLERGIES:  is allergic to ciprofloxacin and gluten meal.  MEDICATIONS:  Current Outpatient Prescriptions  Medication Sig Dispense Refill  . alendronate (FOSAMAX) 70 MG tablet TAKE 1 TABLET EVERY WEEK  12 tablet  0  . anastrozole (ARIMIDEX) 1 MG tablet TAKE 1 TABLET BY MOUTH EVERY DAY  90 tablet  1  . B Complex-C (SUPER B COMPLEX PO) Take 1 tablet by mouth daily.      [name] gabapentin (NEURONTIN) 100 MG capsule Take 2 capsules (200 mg total) by mouth 3 (three) times daily.  540 capsule  3  . levothyroxine (SYNTHROID, LEVOTHROID) 25 MCG tablet TAKE 1 TABLET EVERY DAY  90 tablet  3  . liothyronine (CYTOMEL) 25 MCG tablet Take 0.5 tablets (12.5 mcg total) by mouth daily.  45 tablet  3  . Multiple Vitamin (MULTIVITAMIN) tablet Take 1 tablet by mouth daily.      . prochlorperazine (COMPAZINE) 10 MG tablet Take 10 mg by mouth every 6 (six) hours as needed for nausea or vomiting.      [name]  valACYclovir (VALTREX) 500 MG tablet       . venlafaxine XR (EFFEXOR-XR) 75 MG 24 hr capsule Take 2 capsules (150 mg total) by mouth daily with breakfast.  180 capsule  5  . XARELTO 20 MG TABS tablet TAKE 1 TABLET (20 MG TOTAL) BY MOUTH DAILY.  90 tablet  0   No current facility-administered medications for this visit.    PHYSICAL EXAMINATION: ECOG PERFORMANCE STATUS: 1 - Symptomatic but completely ambulatory  Filed Vitals:   03/29/14  0847  BP: 126/79  Pulse: 86  Temp: 97.7 F (36.5 C)  Resp: 18   Filed Weights   03/29/14 0847  Weight: 157 lb 1.6 oz (71.26 kg)    GENERAL:alert, no distress and comfortable SKIN: skin color, texture, turgor are normal, no rashes or significant lesions EYES: normal, Conjunctiva are pink and non-injected, sclera clear OROPHARYNX:no exudate, no erythema and lips, buccal mucosa, and tongue normal  NECK: supple, thyroid normal size, non-tender, without nodularity LYMPH:  no palpable lymphadenopathy in the cervical, axillary or inguinal LUNGS: clear to auscultation and percussion with normal breathing effort HEART: regular rate & rhythm and no murmurs and no lower extremity edema ABDOMEN:abdomen soft, non-tender and normal bowel sounds Musculoskeletal:no cyanosis of digits and no clubbing  NEURO: alert & oriented x 3 with fluent speech, no focal motor/sensory deficits  LABORATORY DATA:  I have reviewed the data as listed   Chemistry      Component Value Date/Time   NA 141 12/10/2013 0950   NA 142 08/05/2013 1206   K 4.3 12/10/2013 0950   K 3.8 08/05/2013 1206   CL 102 08/05/2013 1206   CL 105 01/06/2013 0827   CO2 22 12/10/2013 0950   CO2 25 08/05/2013 1206   BUN 10.4 12/10/2013 0950   BUN 11 08/05/2013 1206   CREATININE 0.7 12/10/2013 0950   CREATININE 0.54 08/05/2013 1206      Component Value Date/Time   CALCIUM 9.2 12/10/2013 0950   CALCIUM 9.2 08/05/2013 1206   ALKPHOS 162* 12/10/2013 0950   ALKPHOS 163* 12/04/2013 1616   AST 31 12/10/2013 0950   AST 51* 12/04/2013 1616   ALT 43 12/10/2013 0950   ALT 74* 12/04/2013 1616   BILITOT 0.58 12/10/2013 0950   BILITOT 0.8 12/04/2013 1616       Lab Results  Component Value Date   WBC 5.4 12/10/2013   HGB 13.4 12/10/2013   HCT 40.9 12/10/2013   MCV 86.4 12/10/2013   PLT 240 12/10/2013   NEUTROABS 2.5 12/10/2013     RADIOGRAPHIC STUDIES: I have personally reviewed the radiology reports and agreed with their findings. No results found.    ASSESSMENT & PLAN:  Cancer of lower-inner quadrant of female breast Right breast invasive ductal carcinoma ER 2% positive T1 C. N0 M0 stage IA: Continue with antiestrogen therapy with Arimidex. Patient has hot flashes and is on Effexor and Neurontin. I do not believe any additional medication will it with her anymore benefit. I recommended that she try yoga and do daily exercise to decrease the likelihood of hot flashes.  DVT (deep venous thrombosis) DVT right leg: Ultrasound of the right leg recently showed persistent chronic DVT of the right popliteal vein. The remainder of the blood clots have disappeared. The d-dimer was negative but I discussed that because of this persistent blood clot in the right popliteal and the fact that she is on antiestrogen therapy, I would recommend continuing Xarelto for now. Our plan is to repeat another  ultrasound in 6 months and reevaluate.  DEPRESSION Continue with Effexor.    Orders Placed This Encounter  Procedures  . CBC with Differential    Standing Status: Future     Number of Occurrences:      Standing Expiration Date: 03/29/2015  . Comprehensive metabolic panel (Cmet) - [facility]    Standing Status: Future     Number of Occurrences:      Standing Expiration Date: 03/29/2015   The patient has a good understanding of the overall plan. she agrees with it. She will call with any problems that may develop before her next visit here.  I spent 25 minutes counseling the patient face to face. The total time spent in the appointment was 30 minutes and more than 50% was on counseling and review of test results    Rulon Eisenmenger, MD 03/29/2014 9:33 AM

## 2014-03-29 NOTE — Assessment & Plan Note (Signed)
Continue with Effexor 

## 2014-03-29 NOTE — Assessment & Plan Note (Signed)
Right breast invasive ductal carcinoma ER 2% positive T1 C. N0 M0 stage IA: Continue with antiestrogen therapy with Arimidex. Patient has hot flashes and is on Effexor and Neurontin. I do not believe any additional medication will it with her anymore benefit. I recommended that she try yoga and do daily exercise to decrease the likelihood of hot flashes.

## 2014-04-01 ENCOUNTER — Other Ambulatory Visit: Payer: Self-pay | Admitting: *Deleted

## 2014-04-01 ENCOUNTER — Other Ambulatory Visit: Payer: Self-pay | Admitting: Oncology

## 2014-04-01 DIAGNOSIS — I82409 Acute embolism and thrombosis of unspecified deep veins of unspecified lower extremity: Secondary | ICD-10-CM

## 2014-04-04 ENCOUNTER — Other Ambulatory Visit (HOSPITAL_COMMUNITY): Payer: Self-pay | Admitting: Oncology

## 2014-04-04 DIAGNOSIS — C50311 Malignant neoplasm of lower-inner quadrant of right female breast: Secondary | ICD-10-CM

## 2014-04-06 ENCOUNTER — Encounter: Payer: Self-pay | Admitting: Family Medicine

## 2014-04-06 ENCOUNTER — Ambulatory Visit (INDEPENDENT_AMBULATORY_CARE_PROVIDER_SITE_OTHER): Payer: Managed Care, Other (non HMO) | Admitting: Family Medicine

## 2014-04-06 VITALS — BP 104/72 | HR 88 | Temp 98.4°F | Ht 66.0 in | Wt 159.0 lb

## 2014-04-06 DIAGNOSIS — E039 Hypothyroidism, unspecified: Secondary | ICD-10-CM

## 2014-04-06 NOTE — Progress Notes (Addendum)
No chief complaint on file.   HPI:  Here for Med Check. Prior patient of Dr. Arnoldo Morale.   Needs refill of thyroid medication. She is seeing heme/onc and is s/p chemotherapy and mastectomy for breast cancer. She struggles with weight gain. Hx of DVT managed by hematologist. Dx a long time ago with hypothyroidism. No labs in a long time.  She is on cytomel too and is not sure why.Denies heat or cold intolerance, skin changes, palpitations. Denies hx of nodules, thyroid ca, RAI.   ROS: See pertinent positives and negatives per HPI.  Past Medical History  Diagnosis Date  . Thyroid disease   . Depression   . Sprue   . Breast cancer     right  . Hypothyroidism   . DVT (deep venous thrombosis) 2014    Right lower extremity, thigh  . Wears glasses   . Pneumonia     hx of years ago  . History of chemotherapy   . Neuropathy     of fingers and toes    Past Surgical History  Procedure Laterality Date  . Esophagogastroduodenoscopy  2007    sprue  . Colonoscopy  2006  . Cryoblation of cervix      long time ago  . Breast lumpectomy    . Total mastectomy Bilateral 09/11/2012    Procedure: bilateral MASTECTOMY;  Surgeon: Odis Hollingshead, MD;  Location: [place];  Service: General;  Laterality: Bilateral;  . Axillary sentinel node biopsy Right 09/11/2012    Procedure: AXILLARY SENTINEL lymph NODE  BIOPSY;  Surgeon: Odis Hollingshead, MD;  Location: [place];  Service: General;  Laterality: Right;  right nuclear medicine injection 12:30   . Breast reconstruction with placement of tissue expander and flex hd (acellular hydrated dermis) Bilateral 09/11/2012    Procedure: BREAST RECONSTRUCTION WITH PLACEMENT OF TISSUE EXPANDER AND FLEX HD (ACELLULAR HYDRATED DERMIS) ADM;  Surgeon: Theodoro Kos, DO;  Location: [place];  Service: Plastics;  Laterality: Bilateral;  . Incision and drainage of wound Left 09/16/2012    Procedure: Left Breast Evacuation of Hematoma;  Surgeon: Theodoro Kos, DO;  Location: [place];   Service: Plastics;  Laterality: Left;  . Portacath placement Right 10/09/2012    Procedure: US GUIDED INSERTION PORT-A-CATH;  Surgeon: Odis Hollingshead, MD;  Location: [place];  Service: General;  Laterality: Right;  Right Subclavian Vein  . Removal of bilateral tissue expanders with placement of bilateral breast implants Bilateral 06/24/2013    Procedure: REMOVAL OF BILATERAL TISSUE EXPANDERS WITH PLACEMENT OF BILATERAL BREAST IMPLANTS;  Surgeon: Theodoro Kos, DO;  Location: [place];  Service: Plastics;  Laterality: Bilateral;  . Vulvectomy N/A 07/14/2013    Procedure: WIDE LOCAL  EXCISION VULVAR;  Surgeon: Alvino Chapel, MD;  Location: [facility];  Service: Gynecology;  Laterality: N/A;  . Port-a-cath removal Right 08/07/2013    Procedure: REMOVAL PORT-A-CATH;  Surgeon: Odis Hollingshead, MD;  Location: [place];  Service: General;  Laterality: Right;  . Breast reconstruction Right 09/24/2013    Procedure: REVISION OF RIGHT BREAST RECONSTRUCTION WITH REPOSITIONING RIGHT IMPLANT, POSSIBLE EXCISION CAPSULAR CONTRACTURE AND LIPOFILLING FOR FAT GRAFTING;  Surgeon: Theodoro Kos, DO;  Location: [place];  Service: Plastics;  Laterality: Right;  . Liposuction with lipofilling Bilateral 09/24/2013    Procedure: LIPOSUCTION WITH LIPOFILLING;  Surgeon: Theodoro Kos, DO;  Location: [place];  Service: Plastics;  Laterality: Bilateral;  Biltalteal filling breast    Family  History  Problem Relation Age of Onset  . Breast cancer Paternal Aunt     dx in her 49s  . Cancer Paternal Aunt     breast  . Lymphoma Paternal Uncle   . Breast cancer Paternal Grandmother     dx >50  . Cancer Paternal Grandmother     breast  . Heart attack Paternal Grandfather   . Breast cancer Other     2 maternal great aunts with breast cancer >50  . Cancer Other     breast    History   Social History  . Marital Status: Married     Spouse Name: N/A    Number of Children: N/A  . Years of Education: N/A   Social History Main Topics  . Smoking status: Never Smoker   . Smokeless tobacco: Never Used  . Alcohol Use: No  . Drug Use: No  . Sexual Activity: Yes    Birth Control/ Protection: Post-menopausal   Other Topics Concern  . None   Social History Narrative  . None    Current outpatient prescriptions:alendronate (FOSAMAX) 70 MG tablet, TAKE 1 TABLET EVERY WEEK, Disp: 12 tablet, Rfl: 0;  anastrozole (ARIMIDEX) 1 MG tablet, TAKE 1 TABLET BY MOUTH EVERY DAY, Disp: 90 tablet, Rfl: 1;  B Complex-C (SUPER B COMPLEX PO), Take 1 tablet by mouth daily., Disp: , Rfl: ;  gabapentin (NEURONTIN) 100 MG capsule, Take 2 capsules (200 mg total) by mouth 3 (three) times daily., Disp: 540 capsule, Rfl: 3 levothyroxine (SYNTHROID, LEVOTHROID) 25 MCG tablet, TAKE 1 TABLET EVERY DAY, Disp: 90 tablet, Rfl: 3;  liothyronine (CYTOMEL) 25 MCG tablet, Take 0.5 tablets (12.5 mcg total) by mouth daily., Disp: 45 tablet, Rfl: 3;  Multiple Vitamin (MULTIVITAMIN) tablet, Take 1 tablet by mouth daily., Disp: , Rfl: ;  prochlorperazine (COMPAZINE) 10 MG tablet, Take 10 mg by mouth every 6 (six) hours as needed for nausea or vomiting., Disp: , Rfl:  valACYclovir (VALTREX) 500 MG tablet, , Disp: , Rfl: ;  venlafaxine XR (EFFEXOR-XR) 75 MG 24 hr capsule, Take 2 capsules (150 mg total) by mouth daily with breakfast., Disp: 180 capsule, Rfl: 5;  XARELTO 20 MG TABS tablet, TAKE 1 TABLET (20 MG TOTAL) BY MOUTH DAILY., Disp: 90 tablet, Rfl: 1  EXAM:  Filed Vitals:   04/06/14 1518  BP: 104/72  Pulse: 88  Temp: 98.4 F (36.9 C)    Body mass index is 25.68 kg/(m^2).  GENERAL: vitals reviewed and listed above, alert, oriented, appears well hydrated and in no acute distress  HEENT: atraumatic, conjunttiva clear, no obvious abnormalities on inspection of external nose and ears  NECK: no obvious masses on inspection  LUNGS: clear to auscultation  bilaterally, no wheezes, rales or rhonchi, good air movement  CV: HRRR, no peripheral edema  MS: moves all extremities without noticeable abnormality  PSYCH: pleasant and cooperative, no obvious depression or anxiety  ASSESSMENT AND PLAN:  Discussed the following assessment and plan:  Unspecified hypothyroidism - Plan: TSH, T4, Free, T3  -reports she doesn't understand why on the cytomel - I don't know that she needs it as current review of literature does not support using T3 products in most patients with hypothyroidism - would expect possible abnormal T3 and low T4 levels with this treatment -advised would consider stopping the cytomel, she will consider -checking TFTs and she may see endocrinologist -Patient advised to return or notify a doctor immediately if symptoms worsen or persist or new concerns arise.  Patient Instructions  -We have ordered labs or studies at this visit. It can take up to 1-2 weeks for results and processing. We will contact you with instructions IF your results are abnormal. Normal results will be released to your [facility]. If you have not heard from [place] or can not find your results in [facility] in 2 weeks please contact our office.   -please schedule a new patient visit with me or Dr. Durene Cal, Nickola Major.

## 2014-04-06 NOTE — Progress Notes (Signed)
Pre visit review using our clinic review tool, if applicable. No additional management support is needed unless otherwise documented below in the visit note. 

## 2014-04-06 NOTE — Patient Instructions (Signed)
-  We have ordered labs or studies at this visit. It can take up to 1-2 weeks for results and processing. We will contact you with instructions IF your results are abnormal. Normal results will be released to your [facility]. If you have not heard from [place] or can not find your results in [facility] in 2 weeks please contact our office.   -please schedule a new patient visit with me or Dr. Yong Channel

## 2014-04-07 LAB — T3: T3, Total: 139.7 ng/dL (ref 80.0–204.0)

## 2014-04-07 LAB — T4, FREE: Free T4: 0.46 ng/dL — ABNORMAL LOW (ref 0.60–1.60)

## 2014-04-07 LAB — TSH: TSH: 1.48 u[IU]/mL (ref 0.35–4.50)

## 2014-04-16 ENCOUNTER — Other Ambulatory Visit: Payer: Managed Care, Other (non HMO)

## 2014-04-16 ENCOUNTER — Ambulatory Visit: Payer: Managed Care, Other (non HMO) | Admitting: Adult Health

## 2014-04-23 ENCOUNTER — Encounter: Payer: Self-pay | Admitting: Family Medicine

## 2014-04-23 ENCOUNTER — Ambulatory Visit (INDEPENDENT_AMBULATORY_CARE_PROVIDER_SITE_OTHER): Payer: Managed Care, Other (non HMO) | Admitting: Family Medicine

## 2014-04-23 ENCOUNTER — Other Ambulatory Visit: Payer: Self-pay | Admitting: Adult Health

## 2014-04-23 VITALS — BP 100/74 | HR 70 | Temp 98.0°F | Ht 66.0 in | Wt 158.0 lb

## 2014-04-23 DIAGNOSIS — H6590 Unspecified nonsuppurative otitis media, unspecified ear: Secondary | ICD-10-CM

## 2014-04-23 DIAGNOSIS — J069 Acute upper respiratory infection, unspecified: Secondary | ICD-10-CM

## 2014-04-23 MED ORDER — FLUTICASONE PROPIONATE 50 MCG/ACT NA SUSP: 2.0000 | Freq: Every day | NASAL | Status: DC

## 2014-04-23 NOTE — Progress Notes (Signed)
Pre visit review using our clinic review tool, if applicable. No additional management support is needed unless otherwise documented below in the visit note. 

## 2014-04-23 NOTE — Patient Instructions (Signed)
-  AFRIN nasal spray for 4 days then STOP to prevent adverse effects  -flonase or nasocort daily for 21 days  -claritin daily for 1 month  -follow up if worsening, fevers, sinus pain, trouble breathing or not improving in 5-7 days

## 2014-04-23 NOTE — Progress Notes (Signed)
No chief complaint on file.   HPI:  -started: 2 days ago -symptoms:nasal congestion, PND, sneezing, R ear pressure -denies:fever, SOB, NVD, tooth pain -has tried: OTC cold medications -sick contacts/travel/risks: denies flu exposure, tick exposure or or Ebola risks - grandson with a viral URI -Hx of: allergies ROS: See pertinent positives and negatives per HPI.  Past Medical History  Diagnosis Date  . Thyroid disease   . Depression   . Sprue   . Breast cancer     right  . Hypothyroidism   . DVT (deep venous thrombosis) 2014    Right lower extremity, thigh  . Wears glasses   . Pneumonia     hx of years ago  . History of chemotherapy   . Neuropathy     of fingers and toes    Past Surgical History  Procedure Laterality Date  . Esophagogastroduodenoscopy  2007    sprue  . Colonoscopy  2006  . Cryoblation of cervix      long time ago  . Breast lumpectomy    . Total mastectomy Bilateral 09/11/2012    Procedure: bilateral MASTECTOMY;  Surgeon: Odis Hollingshead, MD;  Location: [place];  Service: General;  Laterality: Bilateral;  . Axillary sentinel node biopsy Right 09/11/2012    Procedure: AXILLARY SENTINEL lymph NODE  BIOPSY;  Surgeon: Odis Hollingshead, MD;  Location: [place];  Service: General;  Laterality: Right;  right nuclear medicine injection 12:30   . Breast reconstruction with placement of tissue expander and flex hd (acellular hydrated dermis) Bilateral 09/11/2012    Procedure: BREAST RECONSTRUCTION WITH PLACEMENT OF TISSUE EXPANDER AND FLEX HD (ACELLULAR HYDRATED DERMIS) ADM;  Surgeon: Theodoro Kos, DO;  Location: [place];  Service: Plastics;  Laterality: Bilateral;  . Incision and drainage of wound Left 09/16/2012    Procedure: Left Breast Evacuation of Hematoma;  Surgeon: Theodoro Kos, DO;  Location: [place];  Service: Plastics;  Laterality: Left;  . Portacath placement Right 10/09/2012    Procedure: US GUIDED INSERTION PORT-A-CATH;  Surgeon: Odis Hollingshead, MD;   Location: [place];  Service: General;  Laterality: Right;  Right Subclavian Vein  . Removal of bilateral tissue expanders with placement of bilateral breast implants Bilateral 06/24/2013    Procedure: REMOVAL OF BILATERAL TISSUE EXPANDERS WITH PLACEMENT OF BILATERAL BREAST IMPLANTS;  Surgeon: Theodoro Kos, DO;  Location: [place];  Service: Plastics;  Laterality: Bilateral;  . Vulvectomy N/A 07/14/2013    Procedure: WIDE LOCAL  EXCISION VULVAR;  Surgeon: Alvino Chapel, MD;  Location: [facility];  Service: Gynecology;  Laterality: N/A;  . Port-a-cath removal Right 08/07/2013    Procedure: REMOVAL PORT-A-CATH;  Surgeon: Odis Hollingshead, MD;  Location: [place];  Service: General;  Laterality: Right;  . Breast reconstruction Right 09/24/2013    Procedure: REVISION OF RIGHT BREAST RECONSTRUCTION WITH REPOSITIONING RIGHT IMPLANT, POSSIBLE EXCISION CAPSULAR CONTRACTURE AND LIPOFILLING FOR FAT GRAFTING;  Surgeon: Theodoro Kos, DO;  Location: [place];  Service: Plastics;  Laterality: Right;  . Liposuction with lipofilling Bilateral 09/24/2013    Procedure: LIPOSUCTION WITH LIPOFILLING;  Surgeon: Theodoro Kos, DO;  Location: [place];  Service: Plastics;  Laterality: Bilateral;  Biltalteal filling breast    Family History  Problem Relation Age of Onset  . Breast cancer Paternal Aunt     dx in her 56s  . Cancer Paternal Aunt     breast  . Lymphoma Paternal Uncle   .  Breast cancer Paternal Grandmother     dx >50  . Cancer Paternal Grandmother     breast  . Heart attack Paternal Grandfather   . Breast cancer Other     2 maternal great aunts with breast cancer >50  . Cancer Other     breast    History   Social History  . Marital Status: Married    Spouse Name: N/A    Number of Children: N/A  . Years of Education: N/A   Social History Main Topics  . Smoking status: Never Smoker   . Smokeless  tobacco: Never Used  . Alcohol Use: No  . Drug Use: No  . Sexual Activity: Yes    Birth Control/ Protection: Post-menopausal   Other Topics Concern  . None   Social History Narrative  . None    Current outpatient prescriptions:alendronate (FOSAMAX) 70 MG tablet, TAKE 1 TABLET EVERY WEEK, Disp: 12 tablet, Rfl: 0;  anastrozole (ARIMIDEX) 1 MG tablet, TAKE 1 TABLET BY MOUTH EVERY DAY, Disp: 90 tablet, Rfl: 1;  B Complex-C (SUPER B COMPLEX PO), Take 1 tablet by mouth daily., Disp: , Rfl: ;  Cholecalciferol (VITAMIN D-3 PO), Take by mouth daily., Disp: , Rfl:  gabapentin (NEURONTIN) 100 MG capsule, Take 2 capsules (200 mg total) by mouth 3 (three) times daily., Disp: 540 capsule, Rfl: 3;  levothyroxine (SYNTHROID, LEVOTHROID) 25 MCG tablet, TAKE 1 TABLET EVERY DAY, Disp: 90 tablet, Rfl: 3;  liothyronine (CYTOMEL) 25 MCG tablet, Take 0.5 tablets (12.5 mcg total) by mouth daily., Disp: 45 tablet, Rfl: 3;  Multiple Vitamin (MULTIVITAMIN) tablet, Take 1 tablet by mouth daily., Disp: , Rfl:  prochlorperazine (COMPAZINE) 10 MG tablet, Take 10 mg by mouth every 6 (six) hours as needed for nausea or vomiting., Disp: , Rfl: ;  valACYclovir (VALTREX) 500 MG tablet, , Disp: , Rfl: ;  venlafaxine XR (EFFEXOR-XR) 75 MG 24 hr capsule, Take 2 capsules (150 mg total) by mouth daily with breakfast., Disp: 180 capsule, Rfl: 5;  XARELTO 20 MG TABS tablet, TAKE 1 TABLET (20 MG TOTAL) BY MOUTH DAILY., Disp: 90 tablet, Rfl: 1 fluticasone (FLONASE) 50 MCG/ACT nasal spray, Place 2 sprays into both nostrils daily., Disp: 16 g, Rfl: 0  EXAM:  Filed Vitals:   04/23/14 1001  BP: 100/74  Pulse: 70  Temp: 98 F (36.7 C)    Body mass index is 25.51 kg/(m^2).  GENERAL: vitals reviewed and listed above, alert, oriented, appears well hydrated and in no acute distress  HEENT: atraumatic, conjunttiva clear, no obvious abnormalities on inspection of external nose and ears, normal appearance of ear canals and TMs, clear nasal  congestion, mild post oropharyngeal erythema with PND, no tonsillar edema or exudate, no sinus TTP  NECK: no obvious masses on inspection  LUNGS: clear to auscultation bilaterally, no wheezes, rales or rhonchi, good air movement  CV: HRRR, no peripheral edema  MS: moves all extremities without noticeable abnormality  PSYCH: pleasant and cooperative, no obvious depression or anxiety  ASSESSMENT AND PLAN:  Discussed the following assessment and plan:  Acute upper respiratory infection  Middle ear effusion, unspecified laterality - Plan: fluticasone (FLONASE) 50 MCG/ACT nasal spray  -given HPI and exam findings today, a serious infection or illness is unlikely. We discussed potential etiologies, with VURI being most likely, and advised supportive care and monitoring. We discussed treatment side effects, likely course, antibiotic misuse, transmission, and signs of developing a serious illness. -of course, we advised to return or notify a  doctor immediately if symptoms worsen or persist or new concerns arise.    Patient Instructions  -AFRIN nasal spray for 4 days then STOP to prevent adverse effects  -flonase or nasocort daily for 21 days  -claritin daily for 1 month  -follow up if worsening, fevers, sinus pain, trouble breathing or not improving in 5-7 days      Ritisha Deitrick R.

## 2014-04-30 ENCOUNTER — Telehealth: Payer: Self-pay | Admitting: Internal Medicine

## 2014-04-30 MED ORDER — HYDROCODONE-HOMATROPINE 5-1.5 MG/5ML PO SYRP: 5.0000 mL | ORAL_SOLUTION | Freq: Two times a day (BID) | ORAL | Status: DC | PRN

## 2014-04-30 NOTE — Telephone Encounter (Signed)
Spoke with patient and she would like to try the Hydromet and will call back if needed.

## 2014-04-30 NOTE — Telephone Encounter (Signed)
I am sorry she is still feeling poorly. This looked like a viral infection or allergies when we saw her.  Viral infections usually last 10-18 days on average and unfortunately, an antibiotic would not help a viral infection in any way and may cause harm and make her feel worse. Ok to call in cough medicine - hycodan - 46ml q 12hr prn. If fever > 100, pain in sinuses, trouble breathing in chest or worried she should be seen and we could work her in at [employer] today.

## 2014-04-30 NOTE — Telephone Encounter (Signed)
Pt seen last Friday and is not better, pt states even worse. Low grade fever(99) sore throat, more coughing. Pt would like abx called  Into  [facility] And a cough med and will pu if possible

## 2014-05-01 ENCOUNTER — Encounter: Payer: Self-pay | Admitting: Internal Medicine

## 2014-05-01 ENCOUNTER — Ambulatory Visit (INDEPENDENT_AMBULATORY_CARE_PROVIDER_SITE_OTHER): Payer: Managed Care, Other (non HMO) | Admitting: Internal Medicine

## 2014-05-01 VITALS — BP 110/74 | Temp 98.0°F | Wt 156.0 lb

## 2014-05-01 DIAGNOSIS — J042 Acute laryngotracheitis: Secondary | ICD-10-CM

## 2014-05-01 DIAGNOSIS — Z7901 Long term (current) use of anticoagulants: Secondary | ICD-10-CM

## 2014-05-01 MED ORDER — CLARITHROMYCIN 500 MG PO TABS: 500.0000 mg | ORAL_TABLET | Freq: Two times a day (BID) | ORAL | Status: DC

## 2014-05-01 MED ORDER — PREDNISONE 20 MG PO TABS: ORAL_TABLET | ORAL | Status: DC

## 2014-05-01 NOTE — Patient Instructions (Signed)
Antibiotic .    At this time would  Not add the prednisone and only if coughing  .  Call  us .  Fever should be gone in 48 -72 hours

## 2014-05-01 NOTE — Progress Notes (Signed)
Chief Complaint  Patient presents with  . Laryngitis    deep chest congestion, cough, deep sore throat, elevated temperature at night, sweating, earts stopped up, chest tightness    HPI: Patient comes in today for [facility] Saturday clinic for  new problem evaluation. Here with husband who had similar illness but got better with antibiotic and steroid ( for deep chest and asthma cough) Seen dr Maudie Mercury for uri 2 days and middle ear effusion on 10 9  Cough med  Fever last night   Getting  Sick for over r a week pre visit then and  And now worsening with very sore throat and hoarseness... hurts to cough  About 100 . 5  No vomiting som nausea.  No asthma or sob  Feels like "when was in Chemo"  ROS: See pertinent positives and negatives per HPI.no hemoptysis   Past Medical History  Diagnosis Date  . Thyroid disease   . Depression   . Sprue   . Breast cancer     right  . Hypothyroidism   . DVT (deep venous thrombosis) 2014    Right lower extremity, thigh  . Wears glasses   . Pneumonia     hx of years ago  . History of chemotherapy   . Neuropathy     of fingers and toes    Family History  Problem Relation Age of Onset  . Breast cancer Paternal Aunt     dx in her 51s  . Cancer Paternal Aunt     breast  . Lymphoma Paternal Uncle   . Breast cancer Paternal Grandmother     dx >50  . Cancer Paternal Grandmother     breast  . Heart attack Paternal Grandfather   . Breast cancer Other     2 maternal great aunts with breast cancer >50  . Cancer Other     breast    History   Social History  . Marital Status: Married    Spouse Name: N/A    Number of Children: N/A  . Years of Education: N/A   Social History Main Topics  . Smoking status: Never Smoker   . Smokeless tobacco: Never Used  . Alcohol Use: No  . Drug Use: No  . Sexual Activity: Yes    Birth Control/ Protection: Post-menopausal   Other Topics Concern  . None   Social History Narrative  . None    Outpatient  Encounter Prescriptions as of 05/01/2014  Medication Sig  . alendronate (FOSAMAX) 70 MG tablet TAKE 1 TABLET EVERY WEEK  . anastrozole (ARIMIDEX) 1 MG tablet TAKE 1 TABLET BY MOUTH EVERY DAY  . B Complex-C (SUPER B COMPLEX PO) Take 1 tablet by mouth daily.  . Cholecalciferol (VITAMIN D-3 PO) Take by mouth daily.  . fluticasone (FLONASE) 50 MCG/ACT nasal spray Place 2 sprays into both nostrils daily.  [name] gabapentin (NEURONTIN) 100 MG capsule Take 2 capsules (200 mg total) by mouth 3 (three) times daily.  [name] HYDROcodone-homatropine (HYCODAN) 5-1.5 MG/5ML syrup Take 5 mLs by mouth every 12 (twelve) hours as needed.  [name] levothyroxine (SYNTHROID, LEVOTHROID) 25 MCG tablet TAKE 1 TABLET EVERY DAY  . liothyronine (CYTOMEL) 25 MCG tablet Take 0.5 tablets (12.5 mcg total) by mouth daily.  . Multiple Vitamin (MULTIVITAMIN) tablet Take 1 tablet by mouth daily.  . prochlorperazine (COMPAZINE) 10 MG tablet Take 10 mg by mouth every 6 (six) hours as needed for nausea or vomiting.  . valACYclovir (VALTREX) 500 MG tablet   .  venlafaxine XR (EFFEXOR-XR) 75 MG 24 hr capsule Take 2 capsules (150 mg total) by mouth daily with breakfast.  . XARELTO 20 MG TABS tablet TAKE 1 TABLET (20 MG TOTAL) BY MOUTH DAILY.  . clarithromycin (BIAXIN) 500 MG tablet Take 1 tablet (500 mg total) by mouth 2 (two) times daily.  . predniSONE (DELTASONE) 20 MG tablet Take 3 po qd for 2 days then 2 po qd for 3 days,or as directed    EXAM:  BP 110/74  Temp(Src) 98 F (36.7 C) (Oral)  Wt 156 lb (70.761 kg)  LMP 09/11/2012  Body mass index is 25.19 kg/(m^2).  GENERAL: vitals reviewed and listed above, alert, oriented, appears well hydrated and in no acute distress looks uncomfortable   tms no acute finding  Nares mild congestion  OP very red  Beefy post pharyng and very hoarse but no stridor .no lcer  Husband doing most of talking  dema or exudate  NECK: no obvious masses on inspection palpation supple  Mild tenderness ac are    LUNGS: clear to auscultation bilaterally, no wheezes, rales or rhonchi, good air movement CV: HRRR, no clubbing cyanosis or  peripheral edema nl cap refill  MS: moves all extremities without noticeable focal  abnormality PSYCH: pleasant and cooperative, no obvious depression or anxiety  ASSESSMENT AND PLAN:  Discussed the following assessment and plan:  Acute laryngitis and tracheitis pharyngitis -  also . relapsing prog illness now with fever and sever st disc se risk ben of meds   Anticoagulant long-term use Husband got better with pred but he has asthma  Would hold on to rx and call if  Needed  consider adding if  Swelling  Wheezing etc  To dc cough drops  change to sugar free candy.   Disc antibiotic coverage  Husband says not z pack but biaxin  .  Disc risk of med interaction of xeralto .  Fluids airway etc   Expectant management.  -Patient advised to return or notify health care team  if symptoms worsen ,persist or new concerns arise.  Patient Instructions  Antibiotic .    At this time would  Not add the prednisone and only if coughing  .  Call  us .  Fever should be gone in 48 -72 hours    Wanda K. Panosh M.D.  Pre visit review using our clinic review tool, if applicable. No additional management support is needed unless otherwise documented below in the visit note.

## 2014-05-20 ENCOUNTER — Other Ambulatory Visit: Payer: Self-pay | Admitting: Family Medicine

## 2014-05-28 NOTE — Telephone Encounter (Signed)
error 

## 2014-06-21 ENCOUNTER — Other Ambulatory Visit: Payer: Self-pay | Admitting: Hematology and Oncology

## 2014-06-24 ENCOUNTER — Ambulatory Visit (HOSPITAL_BASED_OUTPATIENT_CLINIC_OR_DEPARTMENT_OTHER): Payer: Managed Care, Other (non HMO) | Admitting: Hematology and Oncology

## 2014-06-24 ENCOUNTER — Other Ambulatory Visit (HOSPITAL_BASED_OUTPATIENT_CLINIC_OR_DEPARTMENT_OTHER): Payer: Managed Care, Other (non HMO)

## 2014-06-24 VITALS — BP 134/81 | HR 110 | Temp 98.5°F | Resp 18 | Ht 66.0 in | Wt 163.3 lb

## 2014-06-24 DIAGNOSIS — C50311 Malignant neoplasm of lower-inner quadrant of right female breast: Secondary | ICD-10-CM

## 2014-06-24 DIAGNOSIS — Z170 Estrogen receptor positive status [ER+]: Secondary | ICD-10-CM

## 2014-06-24 DIAGNOSIS — I82401 Acute embolism and thrombosis of unspecified deep veins of right lower extremity: Secondary | ICD-10-CM

## 2014-06-24 LAB — COMPREHENSIVE METABOLIC PANEL (CC13)
ALT: 131 U/L — ABNORMAL HIGH (ref 0–55)
AST: 83 U/L — ABNORMAL HIGH (ref 5–34)
Albumin: 4 g/dL (ref 3.5–5.0)
Alkaline Phosphatase: 193 U/L — ABNORMAL HIGH (ref 40–150)
Anion Gap: 9 mEq/L (ref 3–11)
BUN: 11.5 mg/dL (ref 7.0–26.0)
CO2: 28 mEq/L (ref 22–29)
Calcium: 9.4 mg/dL (ref 8.4–10.4)
Chloride: 104 mEq/L (ref 98–109)
Creatinine: 0.8 mg/dL (ref 0.6–1.1)
EGFR: 84 mL/min/{1.73_m2} — ABNORMAL LOW (ref >90)
Glucose: 98 mg/dl (ref 70–140)
Potassium: 4.1 mEq/L (ref 3.5–5.1)
Sodium: 141 mEq/L (ref 136–145)
Total Bilirubin: 0.41 mg/dL (ref 0.20–1.20)
Total Protein: 7.1 g/dL (ref 6.4–8.3)

## 2014-06-24 LAB — CBC WITH DIFFERENTIAL/PLATELET
BASO%: 0.3 % (ref 0.0–2.0)
Basophils Absolute: 0 10*3/uL (ref 0.0–0.1)
EOS%: 2.4 % (ref 0.0–7.0)
Eosinophils Absolute: 0.1 10*3/uL (ref 0.0–0.5)
HCT: 38.4 % (ref 34.8–46.6)
HGB: 12.9 g/dL (ref 11.6–15.9)
LYMPH%: 40.9 % (ref 14.0–49.7)
MCH: 29.1 pg (ref 25.1–34.0)
MCHC: 33.6 g/dL (ref 31.5–36.0)
MCV: 86.5 fL (ref 79.5–101.0)
MONO#: 0.7 10*3/uL (ref 0.1–0.9)
MONO%: 11.2 % (ref 0.0–14.0)
NEUT#: 2.7 10*3/uL (ref 1.5–6.5)
NEUT%: 45.2 % (ref 38.4–76.8)
Platelets: 248 10*3/uL (ref 145–400)
RBC: 4.44 10*6/uL (ref 3.70–5.45)
RDW: 14.3 % (ref 11.2–14.5)
WBC: 5.9 10*3/uL (ref 3.9–10.3)
lymph#: 2.4 10*3/uL (ref 0.9–3.3)

## 2014-06-24 MED ORDER — PROCHLORPERAZINE MALEATE 10 MG PO TABS: 10.0000 mg | ORAL_TABLET | Freq: Four times a day (QID) | ORAL | Status: DC | PRN

## 2014-06-24 NOTE — Assessment & Plan Note (Signed)
Right lower extremity DVT: Continue with Xarelto will obtain ultrasound of the leg in March of 2016 along with d-dimer and followup on it to determine if she needs to stay on Xarelto any longer.  Return to clinic in 3 months for follow

## 2014-06-24 NOTE — Assessment & Plan Note (Signed)
Right breast invasive ductal carcinoma ER 2% positive T1 C. N0 M0 stage IA: Continue with antiestrogen therapy with Arimidex. Patient has hot flashes and is on Effexor and Neurontin.  Return to clinic in 3 months for followup.

## 2014-06-24 NOTE — Progress Notes (Signed)
Patient Care Team: [name], MD as PCP - General  DIAGNOSIS: Primary cancer of lower-inner quadrant of right female breast   Staging form: Breast, AJCC 7th Edition     Clinical: Stage IA (T1c, N0, cM0) - Unsigned       Staging comments: Staged at breast conference 12.18.13      Pathologic: No stage assigned - Unsigned   SUMMARY OF ONCOLOGIC HISTORY:   Primary cancer of lower-inner quadrant of right female breast   06/18/2012 Mammogram screening mammogram in 06/2012 that showed a spiculated mass in the lower inner quadrant of the right breast. She had ultrasound performed that showed irregular mass at the 5:00 o'clock position 3 cm from the nipple measuring 1.1 cm.   06/19/2012 Initial Biopsy invasive ductal carcinoma with DCIS, grade 3 tumor that was ER 2% PR negative HER-2/neu negative. Ki-67 was elevated at 34%   06/24/2012 Breast MRI middle third of the lower inner quadrant of the right breast a 1.5 x 1.3 x 1.9 cm irregular enhancing mass.    06/24/2012 Initial Diagnosis Cancer of lower-inner quadrant of female breast   09/11/2012 Surgery Bilateral mastectomies   10/15/2012 - 03/03/2013 Chemotherapy AC X 4 followed by Carbo-Taxol weekly X 12   05/19/2013 -  Anti-estrogen oral therapy Arimidex    CHIEF COMPLIANT: follow up of DVT and breast cancer  INTERVAL HISTORY: Nancy Beard is a 58-year-old Caucasian with above-mentioned history of right-sided breast cancer treated with bilateral mastectomies followed by adjuvant chemotherapy that was completed in August 2014. She has been on oral antiestrogen therapy since November 2014. She continues to have intermittent hot flashes are quite severe. She has been on Neurontin and Effexor but still has hot flashes. She is also complaining of neuropathy symptoms in her hands and feet. It has not been getting any better even though the chemotherapy was completed quite a long time ago.  REVIEW OF SYSTEMS:   Constitutional: Denies fevers, chills  or abnormal weight loss Eyes: Denies blurriness of vision Ears, nose, mouth, throat, and face: Denies mucositis or sore throat Respiratory: Denies cough, dyspnea or wheezes Cardiovascular: Denies palpitation, chest discomfort or lower extremity swelling Gastrointestinal:  Denies nausea, heartburn or change in bowel habits Skin: Denies abnormal skin rashes Lymphatics: Denies new lymphadenopathy or easy bruising Neurological:Denies numbness, tingling or new weaknesses Behavioral/Psych: Mood is stable, no new changes  Bilateral mastectomies All other systems were reviewed with the patient and are negative.  I have reviewed the past medical history, past surgical history, social history and family history with the patient and they are unchanged from previous note.  ALLERGIES:  is allergic to ciprofloxacin and gluten meal.  MEDICATIONS:  Current Outpatient Prescriptions  Medication Sig Dispense Refill  . alendronate (FOSAMAX) 70 MG tablet TAKE 1 TABLET EVERY WEEK 12 tablet 0  . anastrozole (ARIMIDEX) 1 MG tablet TAKE 1 TABLET BY MOUTH EVERY DAY 90 tablet 1  . B Complex-C (SUPER B COMPLEX PO) Take 1 tablet by mouth daily.    . Cholecalciferol (VITAMIN D-3 PO) Take by mouth daily.    . gabapentin (NEURONTIN) 100 MG capsule Take 2 capsules (200 mg total) by mouth 3 (three) times daily. 540 capsule 3  . levothyroxine (SYNTHROID, LEVOTHROID) 25 MCG tablet TAKE 1 TABLET EVERY DAY 90 tablet 3  . liothyronine (CYTOMEL) 25 MCG tablet Take 0.5 tablets (12.5 mcg total) by mouth daily. 45 tablet 3  . Multiple Vitamin (MULTIVITAMIN) tablet Take 1 tablet by mouth daily.    .   prochlorperazine (COMPAZINE) 10 MG tablet Take 1 tablet (10 mg total) by mouth every 6 (six) hours as needed for nausea or vomiting. 30 tablet 3  . valACYclovir (VALTREX) 500 MG tablet     . venlafaxine XR (EFFEXOR-XR) 75 MG 24 hr capsule Take 2 capsules (150 mg total) by mouth daily with breakfast. 180 capsule 5  . XARELTO 20 MG  TABS tablet TAKE 1 TABLET (20 MG TOTAL) BY MOUTH DAILY. 90 tablet 1   No current facility-administered medications for this visit.    PHYSICAL EXAMINATION: ECOG PERFORMANCE STATUS: 1 - Symptomatic but completely ambulatory  Filed Vitals:   06/24/14 1535  BP: 134/81  Pulse: 110  Temp: 98.5 F (36.9 C)  Resp: 18   Filed Weights   06/24/14 1535  Weight: 163 lb 4.8 oz (74.072 kg)    GENERAL:alert, no distress and comfortable SKIN: skin color, texture, turgor are normal, no rashes or significant lesions EYES: normal, Conjunctiva are pink and non-injected, sclera clear OROPHARYNX:no exudate, no erythema and lips, buccal mucosa, and tongue normal  NECK: supple, thyroid normal size, non-tender, without nodularity LYMPH:  no palpable lymphadenopathy in the cervical, axillary or inguinal LUNGS: clear to auscultation and percussion with normal breathing effort HEART: regular rate & rhythm and no murmurs and no lower extremity edema ABDOMEN:abdomen soft, non-tender and normal bowel sounds Musculoskeletal:no cyanosis of digits and no clubbing  NEURO: alert & oriented x 3 with fluent speech, no focal motor/sensory deficits  LABORATORY DATA:  I have reviewed the data as listed   Chemistry      Component Value Date/Time   NA 141 06/24/2014 1500   NA 142 08/05/2013 1206   K 4.1 06/24/2014 1500   K 3.8 08/05/2013 1206   CL 102 08/05/2013 1206   CL 105 01/06/2013 0827   CO2 28 06/24/2014 1500   CO2 25 08/05/2013 1206   BUN 11.5 06/24/2014 1500   BUN 11 08/05/2013 1206   CREATININE 0.8 06/24/2014 1500   CREATININE 0.54 08/05/2013 1206      Component Value Date/Time   CALCIUM 9.4 06/24/2014 1500   CALCIUM 9.2 08/05/2013 1206   ALKPHOS 193* 06/24/2014 1500   ALKPHOS 163* 12/04/2013 1616   AST 83* 06/24/2014 1500   AST 51* 12/04/2013 1616   ALT 131* 06/24/2014 1500   ALT 74* 12/04/2013 1616   BILITOT 0.41 06/24/2014 1500   BILITOT 0.8 12/04/2013 1616       Lab Results   Component Value Date   WBC 5.9 06/24/2014   HGB 12.9 06/24/2014   HCT 38.4 06/24/2014   MCV 86.5 06/24/2014   PLT 248 06/24/2014   NEUTROABS 2.7 06/24/2014    ASSESSMENT & PLAN:  Primary cancer of lower-inner quadrant of right female breast Right breast invasive ductal carcinoma ER 2% positive T1 C. N0 M0 stage IA: Continue with antiestrogen therapy with Arimidex. Patient has hot flashes and is on Effexor and Neurontin.  Return to clinic in 3 months for followup.  DVT (deep venous thrombosis) Right lower extremity DVT: Continue with Xarelto will obtain ultrasound of the leg in March of 2016 along with d-dimer and followup on it to determine if she needs to stay on Xarelto any longer.  Return to clinic in 3 months for follow   Orders Placed This Encounter  Procedures  . US Extrem Low Right Comp    Standing Status: Future     Number of Occurrences:      Standing Expiration Date: 08/26/2015      Order Specific Question:  Reason for Exam (SYMPTOM  OR DIAGNOSIS REQUIRED)    Answer:  Right leg DVT    Order Specific Question:  Preferred imaging location?    Answer:  [facility]  . CBC with Differential    Standing Status: Future     Number of Occurrences:      Standing Expiration Date: 06/24/2015  . Comprehensive metabolic panel (Cmet) - [facility]    Standing Status: Future     Number of Occurrences:      Standing Expiration Date: 06/24/2015  . D-dimer, quantitative    Standing Status: Future     Number of Occurrences:      Standing Expiration Date: 06/24/2015   The patient has a good understanding of the overall plan. she agrees with it. She will call with any problems that may develop before her next visit here.   Rulon Eisenmenger, MD 06/24/2014 4:59 PM

## 2014-06-25 ENCOUNTER — Telehealth (HOSPITAL_COMMUNITY): Payer: Self-pay | Admitting: Unknown Physician Specialty

## 2014-06-28 ENCOUNTER — Telehealth: Payer: Self-pay | Admitting: Hematology and Oncology

## 2014-06-28 ENCOUNTER — Other Ambulatory Visit (HOSPITAL_COMMUNITY): Payer: Self-pay | Admitting: Hematology and Oncology

## 2014-06-28 DIAGNOSIS — I82401 Acute embolism and thrombosis of unspecified deep veins of right lower extremity: Secondary | ICD-10-CM

## 2014-06-28 NOTE — Telephone Encounter (Signed)
, °

## 2014-07-02 ENCOUNTER — Encounter: Payer: Self-pay | Admitting: Family Medicine

## 2014-07-02 ENCOUNTER — Ambulatory Visit (INDEPENDENT_AMBULATORY_CARE_PROVIDER_SITE_OTHER): Payer: Managed Care, Other (non HMO) | Admitting: Family Medicine

## 2014-07-02 VITALS — BP 110/70 | Temp 98.2°F | Wt 163.0 lb

## 2014-07-02 DIAGNOSIS — H109 Unspecified conjunctivitis: Secondary | ICD-10-CM

## 2014-07-02 DIAGNOSIS — H1089 Other conjunctivitis: Secondary | ICD-10-CM

## 2014-07-02 DIAGNOSIS — A499 Bacterial infection, unspecified: Secondary | ICD-10-CM

## 2014-07-02 MED ORDER — ERYTHROMYCIN 5 MG/GM OP OINT: 1.0000 "application " | TOPICAL_OINTMENT | Freq: Four times a day (QID) | OPHTHALMIC | Status: DC

## 2014-07-02 NOTE — Progress Notes (Signed)
  Garret Reddish, MD Phone: [phone]  Subjective:   Nancy Beard is a 58 y.o. year old very pleasant female patient who presents with the following:  "Pink Eye" -drove back from [facility] Started  2 days in morning with Thick crusting when wakes up yellow green.  Continues to have yellow drainage during day though does have some watery drainage as well (chronic since treatment for breast cancer) Mild itching but not dominant symptom Eye red on L, Mild itching on opposite eye with no erythema Mild worsening.  No sick contacts. No new contacts with eyes. Does not wear contacts.   Warm compresses used, washing regularly-symptoms continue  ROS-No pain, denies any injury/trauma to the eye. No photophobia  Past Medical History-history DVT on xarelto, osteoporosis, depression, HLD, hypothyroidism, history breast cancer  Medications- reviewed and updated Current Outpatient Prescriptions  Medication Sig Dispense Refill  . alendronate (FOSAMAX) 70 MG tablet TAKE 1 TABLET EVERY WEEK 12 tablet 0  . anastrozole (ARIMIDEX) 1 MG tablet TAKE 1 TABLET BY MOUTH EVERY DAY 90 tablet 1  . B Complex-C (SUPER B COMPLEX PO) Take 1 tablet by mouth daily.    . Cholecalciferol (VITAMIN D-3 PO) Take by mouth daily.    [name] gabapentin (NEURONTIN) 100 MG capsule Take 2 capsules (200 mg total) by mouth 3 (three) times daily. 540 capsule 3  . levothyroxine (SYNTHROID, LEVOTHROID) 25 MCG tablet TAKE 1 TABLET EVERY DAY 90 tablet 3  . liothyronine (CYTOMEL) 25 MCG tablet Take 0.5 tablets (12.5 mcg total) by mouth daily. 45 tablet 3  . Multiple Vitamin (MULTIVITAMIN) tablet Take 1 tablet by mouth daily.    . prochlorperazine (COMPAZINE) 10 MG tablet Take 1 tablet (10 mg total) by mouth every 6 (six) hours as needed for nausea or vomiting. 30 tablet 3  . venlafaxine XR (EFFEXOR-XR) 75 MG 24 hr capsule Take 2 capsules (150 mg total) by mouth daily with breakfast. 180 capsule 5  . XARELTO 20 MG TABS tablet TAKE 1 TABLET  (20 MG TOTAL) BY MOUTH DAILY. 90 tablet 1  . valACYclovir (VALTREX) 500 MG tablet      No current facility-administered medications for this visit.    Objective: BP 110/70 mmHg  Temp(Src) 98.2 F (36.8 C)  Wt 163 lb (73.936 kg)  LMP 09/11/2012 Gen: NAD, resting comfortably on table Visual acuity normal 20/20 bilaterally tested by Clyde Lundborg, CMA  PERRLA Left eye with watering but also noted to have yellow discharge Sclera mildly erythematous but conjunctiva very erythematous as compared to left Sclera and cornea inspected.   TM normal, oropharynx normal, turbinates normal, no rhinorrhea (no signs of URI)   Assessment/Plan:  Bacterial conjunctivitis Most likely diagnosis.Normal vision with no signs or history of abrasion to eye. Treat with erythromycin ointment 5-7 days. Follow up prn. Return precautions advised.   Meds ordered this encounter  Medications  . erythromycin ophthalmic ointment    Sig: Place 1 application into the right eye 4 (four) times daily. One-half inch (1.25 cm) four times daily for 5 to 7 days    Dispense:  3.5 g    Refill:  0

## 2014-07-02 NOTE — Patient Instructions (Signed)

## 2014-07-05 ENCOUNTER — Other Ambulatory Visit: Payer: Self-pay | Admitting: Family Medicine

## 2014-07-05 MED ORDER — LEVOTHYROXINE SODIUM 25 MCG PO TABS: 25.0000 ug | ORAL_TABLET | Freq: Every day | ORAL | Status: DC

## 2014-07-05 NOTE — Telephone Encounter (Signed)
Pt has an upcoming appt with Dr. Yong Channel on 09/06/14

## 2014-09-06 ENCOUNTER — Ambulatory Visit (INDEPENDENT_AMBULATORY_CARE_PROVIDER_SITE_OTHER): Payer: BLUE CROSS/BLUE SHIELD | Admitting: Family Medicine

## 2014-09-06 ENCOUNTER — Encounter: Payer: Self-pay | Admitting: Family Medicine

## 2014-09-06 VITALS — BP 108/70 | Temp 98.5°F | Wt 162.0 lb

## 2014-09-06 DIAGNOSIS — G629 Polyneuropathy, unspecified: Secondary | ICD-10-CM | POA: Insufficient documentation

## 2014-09-06 DIAGNOSIS — IMO0001 Reserved for inherently not codable concepts without codable children: Secondary | ICD-10-CM

## 2014-09-06 DIAGNOSIS — A6000 Herpesviral infection of urogenital system, unspecified: Secondary | ICD-10-CM | POA: Insufficient documentation

## 2014-09-06 DIAGNOSIS — Z23 Encounter for immunization: Secondary | ICD-10-CM

## 2014-09-06 DIAGNOSIS — Z0000 Encounter for general adult medical examination without abnormal findings: Secondary | ICD-10-CM

## 2014-09-06 NOTE — Patient Instructions (Addendum)
Tdap today  You need a colonoscopy in August of this year. You can call us in august to set this up if [facility] doesn't call you.   Come back for fasting labs at your convenience-schedule lab visit please

## 2014-09-06 NOTE — Progress Notes (Signed)
Nancy Reddish, MD Phone: [phone]  Subjective:  Patient presents today to establish care with me as their new primary care provider. Patient was formerly a patient of Dr. Arnoldo Morale. Chief complaint-noted. Patient is a breast cancer survivor and has been doing very well. She is enjoying life but has dealt with some darker times specifically around diagnosis leading to her being started on effexor which has controlled depression. She continues on xarelto for DVT that has not cleared in popliteal vein and is being followed by oncology. She continues on arimedex for breast cancer history. She developed neuropathy after chemo and is doing decently on gabapentin-numbness and tingling in hands and toes. Her thyroid has been controlled but on atypical regimen of cytomel and levothyroxine.   ROS- No SI/HI. No calf pain. No unintentional weight loss or night sweats. No hair or nail changes. No heat/cold intolerance. No constipation or diarrhea. Denies shakiness or anxiety.    The following were reviewed and entered/updated in epic: Past Medical History  Diagnosis Date  . Thyroid disease   . Depression   . Sprue   . Breast cancer     right  . Hypothyroidism   . DVT (deep venous thrombosis) 2014    Right lower extremity, thigh  . Wears glasses   . Pneumonia     hx of years ago  . History of chemotherapy   . Neuropathy     of fingers and toes  . HEMORRHOIDS, EXTERNAL 05/05/2007  . DIVERTICULOSIS, COLON 05/05/2007   Patient Active Problem List   Diagnosis Date Noted  . DVT (deep venous thrombosis) 06/30/2013    Priority: High  . Primary cancer of lower-inner quadrant of right female breast 06/24/2012    Priority: High  . Genital herpes 09/06/2014    Priority: Medium  . Vulvar intraepithelial neoplasia III (VIN III) 06/30/2013    Priority: Medium  . Osteoporosis 05/13/2013    Priority: Medium  . Hyperlipidemia 05/13/2008    Priority: Medium  . Hypothyroidism 01/31/2007    Priority:  Medium  . Depression 01/31/2007    Priority: Medium  . Celiac disease/sprue 01/31/2007    Priority: Medium  . Neuropathy 09/06/2014    Priority: Low  . Anticoagulant long-term use 05/01/2014    Priority: Low  . Persistent headaches 02/22/2014    Priority: Low  . Hot flashes related to aromatase inhibitor therapy 12/10/2013    Priority: Low   Past Surgical History  Procedure Laterality Date  . Esophagogastroduodenoscopy  2007    sprue  . Colonoscopy  2006  . Cryoblation of cervix      long time ago  . Breast lumpectomy      benign lump  . Total mastectomy Bilateral 09/11/2012    Procedure: bilateral MASTECTOMY;  Surgeon: Odis Hollingshead, MD;  Location: [facility];  Service: General;  Laterality: Bilateral;  . Axillary sentinel node biopsy Right 09/11/2012    Procedure: AXILLARY SENTINEL lymph NODE  BIOPSY;  Surgeon: Odis Hollingshead, MD;  Location: [facility];  Service: General;  Laterality: Right;  right nuclear medicine injection 12:30   . Breast reconstruction with placement of tissue expander and flex hd (acellular hydrated dermis) Bilateral 09/11/2012    Procedure: BREAST RECONSTRUCTION WITH PLACEMENT OF TISSUE EXPANDER AND FLEX HD (ACELLULAR HYDRATED DERMIS) ADM;  Surgeon: Theodoro Kos, DO;  Location: [facility];  Service: Plastics;  Laterality: Bilateral;  . Incision and drainage of wound Left 09/16/2012    Procedure: Left Breast Evacuation of Hematoma;  Surgeon: Lyndee Leo  Sanger, DO;  Location: [facility];  Service: Clinical cytogeneticist;  Laterality: Left;  . Portacath placement Right 10/09/2012    Procedure: US GUIDED INSERTION PORT-A-CATH;  Surgeon: Odis Hollingshead, MD;  Location: [facility];  Service: General;  Laterality: Right;  Right Subclavian Vein  . Removal of bilateral tissue expanders with placement of bilateral breast implants Bilateral 06/24/2013    Procedure: REMOVAL OF BILATERAL TISSUE EXPANDERS WITH PLACEMENT OF BILATERAL BREAST IMPLANTS;  Surgeon: Theodoro Kos, DO;  Location:  [facility];  Service: Plastics;  Laterality: Bilateral;  . Vulvectomy N/A 07/14/2013    Procedure: WIDE LOCAL  EXCISION VULVAR;  Surgeon: Alvino Chapel, MD;  Location: [facility];  Service: Gynecology;  Laterality: N/A;  . Port-a-cath removal Right 08/07/2013    Procedure: REMOVAL PORT-A-CATH;  Surgeon: Odis Hollingshead, MD;  Location: [facility];  Service: General;  Laterality: Right;  . Breast reconstruction Right 09/24/2013    Procedure: REVISION OF RIGHT BREAST RECONSTRUCTION WITH REPOSITIONING RIGHT IMPLANT, POSSIBLE EXCISION CAPSULAR CONTRACTURE AND LIPOFILLING FOR FAT GRAFTING;  Surgeon: Theodoro Kos, DO;  Location: [facility];  Service: Plastics;  Laterality: Right;  . Liposuction with lipofilling Bilateral 09/24/2013    Procedure: LIPOSUCTION WITH LIPOFILLING;  Surgeon: Theodoro Kos, DO;  Location: [facility];  Service: Plastics;  Laterality: Bilateral;  Biltalteal filling breast    Family History  Problem Relation Age of Onset  . Breast cancer Paternal Aunt     dx in her 53s  . Lymphoma Paternal Uncle   . Breast cancer Paternal Grandmother     dx >50  . Heart attack Paternal Grandfather   . Breast cancer Other     2 maternal great aunts with breast cancer >50  . Osteoporosis Mother     controlled with diet/exercise  . COPD Father     Medications- reviewed and updated Current Outpatient Prescriptions  Medication Sig Dispense Refill  . alendronate (FOSAMAX) 70 MG tablet TAKE 1 TABLET EVERY WEEK 12 tablet 0  . anastrozole (ARIMIDEX) 1 MG tablet TAKE 1 TABLET BY MOUTH EVERY DAY 90 tablet 1  . B Complex-C (SUPER B COMPLEX PO) Take 1 tablet by mouth daily.    . Cholecalciferol (VITAMIN D-3 PO) Take by mouth daily.    [name] gabapentin (NEURONTIN) 100 MG capsule Take 2 capsules (200 mg total) by mouth 3 (three) times daily. 540 capsule 3  . levothyroxine (SYNTHROID, LEVOTHROID) 25 MCG tablet Take 1 tablet (25 mcg total)  by mouth daily. 90 tablet 3  . liothyronine (CYTOMEL) 25 MCG tablet Take 0.5 tablets (12.5 mcg total) by mouth daily. 45 tablet 3  . Multiple Vitamin (MULTIVITAMIN) tablet Take 1 tablet by mouth daily.    . prochlorperazine (COMPAZINE) 10 MG tablet Take 1 tablet (10 mg total) by mouth every 6 (six) hours as needed for nausea or vomiting. 30 tablet 3  . valACYclovir (VALTREX) 500 MG tablet     . venlafaxine XR (EFFEXOR-XR) 75 MG 24 hr capsule Take 2 capsules (150 mg total) by mouth daily with breakfast. 180 capsule 5  . XARELTO 20 MG TABS tablet TAKE 1 TABLET (20 MG TOTAL) BY MOUTH DAILY. 90 tablet 1   No current facility-administered medications for this visit.    Allergies-reviewed and updated Allergies  Allergen Reactions  . Ciprofloxacin Itching and Other (See Comments)    Patient states she had muscle spasms  . Gluten Meal     History   Social History  .  Marital Status: Married    Spouse Name: N/A  . Number of Children: N/A  . Years of Education: N/A   Social History Main Topics  . Smoking status: Never Smoker   . Smokeless tobacco: Never Used  . Alcohol Use: No  . Drug Use: No  . Sexual Activity: Yes    Birth Control/ Protection: Post-menopausal   Other Topics Concern  . None   Social History Narrative   Married. 1 son- 1 grandson '6 lives in [place].       Works for [employer] full time.       Hobbies: reading/knitting/crochet/outside in fresh air    ROS--See HPI   Objective: BP 108/70 mmHg  Temp(Src) 98.5 F (36.9 C)  Wt 162 lb (73.483 kg)  LMP 09/11/2012 Gen: NAD, resting comfortably HEENT: Mucous membranes are moist. Oropharynx normal Neck: no thyromegaly CV: RRR no murmurs rubs or gallops Declines breast exam as goes to oncology and double mastectomy Lungs: CTAB no crackles, wheeze, rhonchi Abdomen: soft/nontender/nondistended/normal bowel sounds. No rebound or guarding.  Declines GYN exam as done at GYN Ext: no edema, 2+ PT  pulses Skin: warm, dry, no rash Neuro: grossly normal, moves all extremities, PERRLA   Assessment/Plan:  59 y.o. female presenting for annual physical.  Health Maintenance counseling: 1. Anticipatory guidance: Patient counseled regarding regular dental exams, wearing seatbelts, wearing sunscreen. Does not see dermatology. No skin lesions she is concerned about. Sees obgyn to care for GU needs. Followed for breast cancer by oncology.  2. Risk factor reduction:  Advised patient of need for regular exercise and diet rich and fruits and vegetables to reduce risk of heart attack and stroke.  3. Immunizations/screenings/ancillary studies- Tdap today 4. Come back for fasting labs 5. Updated problem list to reflect current plans each problem.   Return precautions advised. Patient should be seen absolute minimum yearly. But 6 months also reasonable. When write to her about labs, consider discussing 6 month follow up.   Future fasting labs.  Suspect possibly may need statin, hopeful LFTs continue to improve off gluten.  Orders Placed This Encounter  Procedures  . Tdap vaccine greater than or equal to 7yo IM  . CBC    [place]    Standing Status: Future     Number of Occurrences:      Standing Expiration Date: 09/07/2015  . Comprehensive metabolic panel    [place]    Standing Status: Future     Number of Occurrences:      Standing Expiration Date: 09/07/2015    Order Specific Question:  Has the patient fasted?    Answer:  No  . Lipid panel    [place]    Standing Status: Future     Number of Occurrences:      Standing Expiration Date: 09/07/2015    Order Specific Question:  Has the patient fasted?    Answer:  No  . TSH        Standing Status: Future     Number of Occurrences:      Standing Expiration Date: 09/07/2015  . POCT urinalysis dipstick    Standing Status: Future     Number of Occurrences:      Standing Expiration Date: 09/07/2015

## 2014-09-10 ENCOUNTER — Other Ambulatory Visit: Payer: Self-pay | Admitting: Hematology and Oncology

## 2014-09-15 ENCOUNTER — Other Ambulatory Visit: Payer: Self-pay | Admitting: Obstetrics and Gynecology

## 2014-09-16 LAB — CYTOLOGY - PAP

## 2014-09-28 ENCOUNTER — Other Ambulatory Visit (HOSPITAL_BASED_OUTPATIENT_CLINIC_OR_DEPARTMENT_OTHER): Payer: BLUE CROSS/BLUE SHIELD

## 2014-09-28 ENCOUNTER — Other Ambulatory Visit: Payer: Self-pay | Admitting: Hematology and Oncology

## 2014-09-28 ENCOUNTER — Ambulatory Visit (HOSPITAL_COMMUNITY)
Admission: RE | Admit: 2014-09-28 | Discharge: 2014-09-28 | Disposition: A | Discharge status: Home / Self Care | Payer: BLUE CROSS/BLUE SHIELD | Source: Ambulatory Visit | Attending: Hematology and Oncology | Admitting: Hematology and Oncology

## 2014-09-28 ENCOUNTER — Other Ambulatory Visit (HOSPITAL_COMMUNITY): Payer: Self-pay | Admitting: Hematology and Oncology

## 2014-09-28 ENCOUNTER — Ambulatory Visit (HOSPITAL_BASED_OUTPATIENT_CLINIC_OR_DEPARTMENT_OTHER): Payer: BLUE CROSS/BLUE SHIELD | Admitting: Hematology and Oncology

## 2014-09-28 ENCOUNTER — Telehealth: Payer: Self-pay | Admitting: *Deleted

## 2014-09-28 ENCOUNTER — Telehealth: Payer: Self-pay | Admitting: Hematology and Oncology

## 2014-09-28 VITALS — BP 136/79 | HR 91 | Temp 98.4°F | Resp 18 | Ht 66.0 in | Wt 161.4 lb

## 2014-09-28 DIAGNOSIS — I82401 Acute embolism and thrombosis of unspecified deep veins of right lower extremity: Secondary | ICD-10-CM | POA: Diagnosis not present

## 2014-09-28 DIAGNOSIS — N951 Menopausal and female climacteric states: Secondary | ICD-10-CM

## 2014-09-28 DIAGNOSIS — C50311 Malignant neoplasm of lower-inner quadrant of right female breast: Secondary | ICD-10-CM

## 2014-09-28 DIAGNOSIS — Z7901 Long term (current) use of anticoagulants: Secondary | ICD-10-CM | POA: Diagnosis not present

## 2014-09-28 DIAGNOSIS — M791 Myalgia: Secondary | ICD-10-CM

## 2014-09-28 DIAGNOSIS — I82531 Chronic embolism and thrombosis of right popliteal vein: Secondary | ICD-10-CM

## 2014-09-28 DIAGNOSIS — Z170 Estrogen receptor positive status [ER+]: Secondary | ICD-10-CM

## 2014-09-28 LAB — COMPREHENSIVE METABOLIC PANEL (CC13)
ALT: 97 U/L — ABNORMAL HIGH (ref 0–55)
AST: 71 U/L — ABNORMAL HIGH (ref 5–34)
Albumin: 3.8 g/dL (ref 3.5–5.0)
Alkaline Phosphatase: 171 U/L — ABNORMAL HIGH (ref 40–150)
Anion Gap: 9 mEq/L (ref 3–11)
BUN: 10.7 mg/dL (ref 7.0–26.0)
CO2: 26 mEq/L (ref 22–29)
Calcium: 9.1 mg/dL (ref 8.4–10.4)
Chloride: 106 mEq/L (ref 98–109)
Creatinine: 0.7 mg/dL (ref 0.6–1.1)
EGFR: >90 mL/min/{1.73_m2} (ref >90)
Glucose: 109 mg/dl (ref 70–140)
Potassium: 4.3 mEq/L (ref 3.5–5.1)
Sodium: 142 mEq/L (ref 136–145)
Total Bilirubin: 0.45 mg/dL (ref 0.20–1.20)
Total Protein: 7 g/dL (ref 6.4–8.3)

## 2014-09-28 LAB — CBC WITH DIFFERENTIAL/PLATELET
BASO%: 0.4 % (ref 0.0–2.0)
Basophils Absolute: 0 10*3/uL (ref 0.0–0.1)
EOS%: 3.9 % (ref 0.0–7.0)
Eosinophils Absolute: 0.2 10*3/uL (ref 0.0–0.5)
HCT: 42.5 % (ref 34.8–46.6)
HGB: 13.7 g/dL (ref 11.6–15.9)
LYMPH%: 36.9 % (ref 14.0–49.7)
MCH: 28.2 pg (ref 25.1–34.0)
MCHC: 32.2 g/dL (ref 31.5–36.0)
MCV: 87.5 fL (ref 79.5–101.0)
MONO#: 0.6 10*3/uL (ref 0.1–0.9)
MONO%: 11.3 % (ref 0.0–14.0)
NEUT#: 2.5 10*3/uL (ref 1.5–6.5)
NEUT%: 47.5 % (ref 38.4–76.8)
Platelets: 245 10*3/uL (ref 145–400)
RBC: 4.86 10*6/uL (ref 3.70–5.45)
RDW: 14.5 % (ref 11.2–14.5)
WBC: 5.2 10*3/uL (ref 3.9–10.3)
lymph#: 1.9 10*3/uL (ref 0.9–3.3)

## 2014-09-28 LAB — D-DIMER, QUANTITATIVE: D-Dimer, Quant: 0.27 ug/mL-FEU (ref 0.00–0.48)

## 2014-09-28 NOTE — Progress Notes (Signed)
Patient Care Team: Marin Olp, MD as PCP - General (Family Medicine)  DIAGNOSIS: Primary cancer of lower-inner quadrant of right female breast   Staging form: Breast, AJCC 7th Edition     Clinical: Stage IA (T1c, N0, cM0) - Unsigned       Staging comments: Staged at breast conference 12.18.13      Pathologic: No stage assigned - Unsigned   SUMMARY OF ONCOLOGIC HISTORY:   Primary cancer of lower-inner quadrant of right female breast   06/18/2012 Mammogram screening mammogram in 06/2012 that showed a spiculated mass in the lower inner quadrant of the right breast. She had ultrasound performed that showed irregular mass at the 5:00 o'clock position 3 cm from the nipple measuring 1.1 cm.   06/19/2012 Initial Biopsy invasive ductal carcinoma with DCIS, grade 3 tumor that was ER 2% PR negative HER-2/neu negative. Ki-67 was elevated at 34%   06/24/2012 Breast MRI middle third of the lower inner quadrant of the right breast a 1.5 x 1.3 x 1.9 cm irregular enhancing mass.    06/24/2012 Initial Diagnosis Cancer of lower-inner quadrant of female breast   09/11/2012 Surgery Bilateral mastectomies   10/15/2012 - 03/03/2013 Chemotherapy AC X 4 followed by Carbo-Taxol weekly X 12   03/30/2013 Procedure DVT Right leg on Xarelto   05/19/2013 -  Anti-estrogen oral therapy Arimidex    CHIEF COMPLIANT: Follow-up of right leg DVT  INTERVAL HISTORY: Nancy Beard is a 59 year old lady with above-mentioned history of right-sided breast cancer treated with bilateral mastectomies and adjuvant chemotherapy. She was diagnosed with DVT after she finished with chemotherapy on her trip to [place]. Since then she has been on oral Xarelto. Ultrasound of the leg done today shows chronic DVT in the right popliteal vein. Patient is very concerned about discontinuing Xarelto therapy because of risk of recurrent DVT. D-dimer is pending from today. She reports no new problems or concerns.  REVIEW OF SYSTEMS:    Constitutional: Denies fevers, chills or abnormal weight loss Eyes: Denies blurriness of vision Ears, nose, mouth, throat, and face: Denies mucositis or sore throat Respiratory: Denies cough, dyspnea or wheezes Cardiovascular: Denies palpitation, chest discomfort or lower extremity swelling Gastrointestinal:  Denies nausea, heartburn or change in bowel habits Skin: Denies abnormal skin rashes Lymphatics: Denies new lymphadenopathy or easy bruising Neurological:Denies numbness, tingling or new weaknesses Behavioral/Psych: Mood is stable, no new changes  Breast:  denies any pain or lumps or nodules in either breasts All other systems were reviewed with the patient and are negative.  I have reviewed the past medical history, past surgical history, social history and family history with the patient and they are unchanged from previous note.  ALLERGIES:  is allergic to ciprofloxacin and gluten meal.  MEDICATIONS:  Current Outpatient Prescriptions  Medication Sig Dispense Refill  . alendronate (FOSAMAX) 70 MG tablet TAKE 1 TABLET BY MOUTH EVERY WEEK 12 tablet 0  . anastrozole (ARIMIDEX) 1 MG tablet TAKE 1 TABLET BY MOUTH EVERY DAY 90 tablet 1  . B Complex-C (SUPER B COMPLEX PO) Take 1 tablet by mouth daily.    . Cholecalciferol (VITAMIN D-3 PO) Take by mouth daily.    [name] gabapentin (NEURONTIN) 100 MG capsule Take 2 capsules (200 mg total) by mouth 3 (three) times daily. 540 capsule 3  . levothyroxine (SYNTHROID, LEVOTHROID) 25 MCG tablet Take 1 tablet (25 mcg total) by mouth daily. 90 tablet 3  . liothyronine (CYTOMEL) 25 MCG tablet Take 0.5 tablets (12.5 mcg total) by mouth  daily. 45 tablet 3  . Multiple Vitamin (MULTIVITAMIN) tablet Take 1 tablet by mouth daily.    . prochlorperazine (COMPAZINE) 10 MG tablet Take 1 tablet (10 mg total) by mouth every 6 (six) hours as needed for nausea or vomiting. 30 tablet 3  . valACYclovir (VALTREX) 500 MG tablet     . venlafaxine XR (EFFEXOR-XR) 75 MG  24 hr capsule Take 2 capsules (150 mg total) by mouth daily with breakfast. 180 capsule 5  . XARELTO 20 MG TABS tablet TAKE 1 TABLET (20 MG TOTAL) BY MOUTH DAILY. 90 tablet 1   No current facility-administered medications for this visit.    PHYSICAL EXAMINATION: ECOG PERFORMANCE STATUS: 0 - Asymptomatic  Filed Vitals:   09/28/14 1107  BP: 136/79  Pulse: 91  Temp: 98.4 F (36.9 C)  Resp: 18   Filed Weights   09/28/14 1107  Weight: 161 lb 6.4 oz (73.211 kg)    GENERAL:alert, no distress and comfortable SKIN: skin color, texture, turgor are normal, no rashes or significant lesions EYES: normal, Conjunctiva are pink and non-injected, sclera clear OROPHARYNX:no exudate, no erythema and lips, buccal mucosa, and tongue normal  NECK: supple, thyroid normal size, non-tender, without nodularity LYMPH:  no palpable lymphadenopathy in the cervical, axillary or inguinal LUNGS: clear to auscultation and percussion with normal breathing effort HEART: regular rate & rhythm and no murmurs and no lower extremity edema ABDOMEN:abdomen soft, non-tender and normal bowel sounds Musculoskeletal:no cyanosis of digits and no clubbing  NEURO: alert & oriented x 3 with fluent speech, no focal motor/sensory deficits BREAST: No palpable masses are felt in the axilla or the reconstructed breasts. (exam performed in the presence of a chaperone)  LABORATORY DATA:  I have reviewed the data as listed   Chemistry      Component Value Date/Time   NA 141 06/24/2014 1500   NA 142 08/05/2013 1206   K 4.1 06/24/2014 1500   K 3.8 08/05/2013 1206   CL 102 08/05/2013 1206   CL 105 01/06/2013 0827   CO2 28 06/24/2014 1500   CO2 25 08/05/2013 1206   BUN 11.5 06/24/2014 1500   BUN 11 08/05/2013 1206   CREATININE 0.8 06/24/2014 1500   CREATININE 0.54 08/05/2013 1206      Component Value Date/Time   CALCIUM 9.4 06/24/2014 1500   CALCIUM 9.2 08/05/2013 1206   ALKPHOS 193* 06/24/2014 1500   ALKPHOS 163*  12/04/2013 1616   AST 83* 06/24/2014 1500   AST 51* 12/04/2013 1616   ALT 131* 06/24/2014 1500   ALT 74* 12/04/2013 1616   BILITOT 0.41 06/24/2014 1500   BILITOT 0.8 12/04/2013 1616       Lab Results  Component Value Date   WBC 5.2 09/28/2014   HGB 13.7 09/28/2014   HCT 42.5 09/28/2014   MCV 87.5 09/28/2014   PLT 245 09/28/2014   NEUTROABS 2.5 09/28/2014     RADIOGRAPHIC STUDIES: I have personally reviewed the radiology reports and agreed with their findings. Ultrasound right leg primary report showed chronic DVT in right popliteal vein  ASSESSMENT & PLAN:  Primary cancer of lower-inner quadrant of right female breast Right breast invasive ductal carcinoma with DCIS, grade 3, ER 2%, PR 0%, HER-2 negative, Ki-67 34%, status post bilateral mastectomies and reconstruction, adjuvant chemotherapy with before meals 4 followed by [name] Taxol 12, antiestrogen therapy with Arimidex started 05/19/2013 eventhough she has only 2% ER positivity.  Arimidex toxicities: 1. Hot flashes: For which she is taking Effexor and Neurontin  benefit 2. Muscle aches and pains  Breast cancer surveillance: No role of Coumadin since she had bilateral mastectomies and breast examination chest wall axilla 09/28/2014 is normal  DVT right leg: Ultrasound of the leg done 09/28/2014 shows chronic popliteal vein DVT, D-dimer test is pending. I discussed with her about repeating the ultrasound in 3 months in follow-up. If she does not have any major difference, we will plan on discontinuing Xarelto therapy.   Return to clinic in 3 months for follow-up    Orders Placed This Encounter  Procedures  . [place] Extrem Low Right Comp    Standing Status: Future     Number of Occurrences:      Standing Expiration Date: 11/28/2015    Order Specific Question:  Reason for Exam (SYMPTOM  OR DIAGNOSIS REQUIRED)    Answer:  Chronic DVT evaluation to check for resolution    Order Specific Question:  Preferred imaging  location?    Answer:  [facility]   The patient has a good understanding of the overall plan. she agrees with it. She will call with any problems that may develop before her next visit here.   Rulon Eisenmenger, MD

## 2014-09-28 NOTE — Assessment & Plan Note (Signed)
Right breast invasive ductal carcinoma with DCIS, grade 3, ER 2%, PR 0%, HER-2 negative, Ki-67 34%, status post bilateral mastectomies and reconstruction, adjuvant chemotherapy with before meals 4 followed by [name] Taxol 12, antiestrogen therapy with Arimidex started 05/19/2013 eventhough she has only 2% ER positivity.  Arimidex toxicities: 1. Hot flashes: For which she is taking Effexor and Neurontin benefit 2. Muscle aches and pains  Breast cancer surveillance: No role of Coumadin since she had bilateral mastectomies and breast examination chest wall axilla 09/28/2014 is normal  DVT right leg: Ultrasound of the leg done 09/28/2014 shows chronic popliteal vein DVT, D-dimer test is pending. I discussed with her about repeating the ultrasound in 3 months in follow-up. If she does not have any major difference, we will plan on discontinuing Xarelto therapy.   Return to clinic in 3 months for follow-up

## 2014-09-28 NOTE — Progress Notes (Signed)
VASCULAR LAB PRELIMINARY  PRELIMINARY  PRELIMINARY  PRELIMINARY  Right lower extremity venous duplex completed.    Preliminary report:  Right:  No evidence of acute DVT, superficial thrombosis, or Baker's cyst.  A small amount of chronic DVT remains in the popliteal vein.    Mar Walmer, RVT 09/28/2014, 9:38 AM

## 2014-09-28 NOTE — Telephone Encounter (Signed)
Nancy Beard called from [facility] reporting "they do not schedule for DVT's.  Vascular lab does and has been made aware of today's request.  Vascular lab will await a call from [facility]."

## 2014-09-28 NOTE — Telephone Encounter (Signed)
appts made and avs pritned for pt °

## 2014-12-03 ENCOUNTER — Other Ambulatory Visit: Payer: Self-pay | Admitting: Hematology and Oncology

## 2014-12-15 ENCOUNTER — Ambulatory Visit (HOSPITAL_COMMUNITY)
Admission: RE | Admit: 2014-12-15 | Discharge: 2014-12-15 | Disposition: A | Discharge status: Home / Self Care | Payer: BLUE CROSS/BLUE SHIELD | Source: Ambulatory Visit | Attending: Hematology and Oncology | Admitting: Hematology and Oncology

## 2014-12-15 ENCOUNTER — Telehealth: Payer: Self-pay | Admitting: Hematology and Oncology

## 2014-12-15 DIAGNOSIS — I82531 Chronic embolism and thrombosis of right popliteal vein: Secondary | ICD-10-CM | POA: Insufficient documentation

## 2014-12-15 DIAGNOSIS — I82401 Acute embolism and thrombosis of unspecified deep veins of right lower extremity: Secondary | ICD-10-CM

## 2014-12-15 NOTE — Telephone Encounter (Signed)
Patient called in and rescheduled her appointment

## 2014-12-15 NOTE — Progress Notes (Signed)
*  PRELIMINARY RESULTS* Vascular Ultrasound Right lower extremity venous duplex has been completed.  Preliminary findings: Chronic thrombus noted in the right popliteal vein. This appears unchanged from studies on 09/28/14, 03/23/14, and 11/10/13. There is  NO ACUTE DVT in the RLE.     Landry Mellow, RDMS, RVT  12/15/2014, 4:45 PM

## 2014-12-16 ENCOUNTER — Ambulatory Visit: Payer: BLUE CROSS/BLUE SHIELD | Admitting: Hematology and Oncology

## 2014-12-16 ENCOUNTER — Telehealth: Payer: Self-pay | Admitting: *Deleted

## 2014-12-16 ENCOUNTER — Other Ambulatory Visit: Payer: BLUE CROSS/BLUE SHIELD

## 2014-12-16 NOTE — Telephone Encounter (Signed)
Received telephone advice record, sent to scan. Dr. Lindi Adie called and gave results of venous duplex to patient.

## 2014-12-20 ENCOUNTER — Telehealth: Payer: Self-pay

## 2014-12-20 NOTE — Telephone Encounter (Signed)
Left message on cell to call back (concerning overdue mammogram.

## 2014-12-23 NOTE — Telephone Encounter (Signed)
Pt has had complete masectomy

## 2014-12-31 ENCOUNTER — Telehealth: Payer: Self-pay | Admitting: *Deleted

## 2014-12-31 DIAGNOSIS — C50319 Malignant neoplasm of lower-inner quadrant of unspecified female breast: Secondary | ICD-10-CM

## 2014-12-31 MED ORDER — VENLAFAXINE HCL ER 75 MG PO CP24: 150.0000 mg | ORAL_CAPSULE | Freq: Every day | ORAL | Status: DC

## 2014-12-31 NOTE — Telephone Encounter (Signed)
PRESCRIPTION E-SCRIBED TO PHARMACY. NOTIFIED PHARMACY THAT PT. WANTS TO PICK UP PRESCRIPTION THIS MORNING BEFORE SHE LEAVES ON VACATION.

## 2015-01-06 ENCOUNTER — Other Ambulatory Visit: Payer: Self-pay | Admitting: Hematology and Oncology

## 2015-01-06 NOTE — Telephone Encounter (Signed)
Per office note of 09/28/14 if no change in venous duplex in 3 months, then discontinue Xarelto. Patient duplex on 6/1 showed no change. Refill not indicated.

## 2015-01-07 ENCOUNTER — Other Ambulatory Visit: Payer: Self-pay

## 2015-01-07 DIAGNOSIS — C50311 Malignant neoplasm of lower-inner quadrant of right female breast: Secondary | ICD-10-CM

## 2015-01-09 NOTE — Assessment & Plan Note (Signed)
Right breast invasive ductal carcinoma with DCIS, grade 3, ER 2%, PR 0%, HER-2 negative, Ki-67 34%, status post bilateral mastectomies and reconstruction, adjuvant chemotherapy with before meals 4 followed by [name] Taxol 12, antiestrogen therapy with Arimidex started 05/19/2013 eventhough she has only 2% ER positivity.  Arimidex toxicities: 1. Hot flashes: For which she is taking Effexor and Neurontin benefit 2. Muscle aches and pains  Breast cancer surveillance: No role of Coumadin since she had bilateral mastectomies and breast examination chest wall axilla 09/28/2014 is normal  DVT right leg: U/S Leg 12/15/14: Chronic deep vein thrombosis noted in the right popliteal vein. Appears unchanged from studies on 09/28/14, 03/23/14, and 11/10/13 Will discontinue xarelto. Patient understands that there is still an increased risk of blood clots. Return to clinic in 6 months for follow-up

## 2015-01-10 ENCOUNTER — Ambulatory Visit (HOSPITAL_BASED_OUTPATIENT_CLINIC_OR_DEPARTMENT_OTHER): Payer: BLUE CROSS/BLUE SHIELD | Admitting: Hematology and Oncology

## 2015-01-10 ENCOUNTER — Other Ambulatory Visit (HOSPITAL_BASED_OUTPATIENT_CLINIC_OR_DEPARTMENT_OTHER): Payer: BLUE CROSS/BLUE SHIELD

## 2015-01-10 ENCOUNTER — Telehealth: Payer: Self-pay | Admitting: Hematology and Oncology

## 2015-01-10 ENCOUNTER — Encounter: Payer: Self-pay | Admitting: Hematology and Oncology

## 2015-01-10 VITALS — BP 136/78 | HR 89 | Temp 98.1°F | Resp 20 | Ht 66.0 in | Wt 162.6 lb

## 2015-01-10 DIAGNOSIS — Z170 Estrogen receptor positive status [ER+]: Secondary | ICD-10-CM

## 2015-01-10 DIAGNOSIS — N951 Menopausal and female climacteric states: Secondary | ICD-10-CM

## 2015-01-10 DIAGNOSIS — C773 Secondary and unspecified malignant neoplasm of axilla and upper limb lymph nodes: Secondary | ICD-10-CM | POA: Diagnosis not present

## 2015-01-10 DIAGNOSIS — C50311 Malignant neoplasm of lower-inner quadrant of right female breast: Secondary | ICD-10-CM

## 2015-01-10 DIAGNOSIS — I82491 Acute embolism and thrombosis of other specified deep vein of right lower extremity: Secondary | ICD-10-CM | POA: Diagnosis not present

## 2015-01-10 DIAGNOSIS — Z79811 Long term (current) use of aromatase inhibitors: Secondary | ICD-10-CM | POA: Diagnosis not present

## 2015-01-10 DIAGNOSIS — C50512 Malignant neoplasm of lower-outer quadrant of left female breast: Secondary | ICD-10-CM | POA: Diagnosis not present

## 2015-01-10 LAB — COMPREHENSIVE METABOLIC PANEL (CC13)
ALT: 127 U/L — ABNORMAL HIGH (ref 0–55)
AST: 97 U/L — ABNORMAL HIGH (ref 5–34)
Albumin: 4.2 g/dL (ref 3.5–5.0)
Alkaline Phosphatase: 172 U/L — ABNORMAL HIGH (ref 40–150)
Anion Gap: 9 mEq/L (ref 3–11)
BUN: 11 mg/dL (ref 7.0–26.0)
CO2: 27 mEq/L (ref 22–29)
Calcium: 9.9 mg/dL (ref 8.4–10.4)
Chloride: 105 mEq/L (ref 98–109)
Creatinine: 0.7 mg/dL (ref 0.6–1.1)
EGFR: >90 mL/min/{1.73_m2} (ref >90)
Glucose: 91 mg/dl (ref 70–140)
Potassium: 4.1 mEq/L (ref 3.5–5.1)
Sodium: 140 mEq/L (ref 136–145)
Total Bilirubin: 0.36 mg/dL (ref 0.20–1.20)
Total Protein: 7.4 g/dL (ref 6.4–8.3)

## 2015-01-10 LAB — CBC WITH DIFFERENTIAL/PLATELET
BASO%: 0.7 % (ref 0.0–2.0)
Basophils Absolute: 0 10*3/uL (ref 0.0–0.1)
EOS%: 2.4 % (ref 0.0–7.0)
Eosinophils Absolute: 0.1 10*3/uL (ref 0.0–0.5)
HCT: 42.1 % (ref 34.8–46.6)
HGB: 13.8 g/dL (ref 11.6–15.9)
LYMPH%: 47.2 % (ref 14.0–49.7)
MCH: 28.5 pg (ref 25.1–34.0)
MCHC: 32.8 g/dL (ref 31.5–36.0)
MCV: 86.7 fL (ref 79.5–101.0)
MONO#: 0.6 10*3/uL (ref 0.1–0.9)
MONO%: 11.6 % (ref 0.0–14.0)
NEUT#: 2.1 10*3/uL (ref 1.5–6.5)
NEUT%: 38.1 % — ABNORMAL LOW (ref 38.4–76.8)
Platelets: 245 10*3/uL (ref 145–400)
RBC: 4.86 10*6/uL (ref 3.70–5.45)
RDW: 14.7 % — ABNORMAL HIGH (ref 11.2–14.5)
WBC: 5.5 10*3/uL (ref 3.9–10.3)
lymph#: 2.6 10*3/uL (ref 0.9–3.3)

## 2015-01-10 NOTE — Progress Notes (Signed)
Patient Care Team: Marin Olp, MD as PCP - General (Family Medicine)  DIAGNOSIS: Primary cancer of lower-inner quadrant of right female breast   Staging form: Breast, AJCC 7th Edition     Clinical: Stage IA (T1c, N0, cM0) - Unsigned       Staging comments: Staged at breast conference 12.18.13      Pathologic: No stage assigned - Unsigned   SUMMARY OF ONCOLOGIC HISTORY:   Primary cancer of lower-inner quadrant of right female breast   06/18/2012 Mammogram screening mammogram in 06/2012 that showed a spiculated mass in the lower inner quadrant of the right breast. She had ultrasound performed that showed irregular mass at the 5:00 o'clock position 3 cm from the nipple measuring 1.1 cm.   06/19/2012 Initial Biopsy invasive ductal carcinoma with DCIS, grade 3 tumor that was ER 2% PR negative HER-2/neu negative. Ki-67 was elevated at 34%   06/24/2012 Breast MRI middle third of the lower inner quadrant of the right breast a 1.5 x 1.3 x 1.9 cm irregular enhancing mass.    06/24/2012 Initial Diagnosis Cancer of lower-inner quadrant of female breast   09/11/2012 Surgery Bilateral mastectomies   10/15/2012 - 03/03/2013 Chemotherapy AC X 4 followed by Carbo-Taxol weekly X 12   03/30/2013 Procedure DVT Right leg on Xarelto   05/19/2013 -  Anti-estrogen oral therapy Arimidex    CHIEF COMPLIANT: Follow-up of breast cancer on Arimidex  INTERVAL HISTORY: Nancy Beard is a 59 year old with above-mentioned history of right breast cancer as well as DVT who is been on Xarelto and tolerating it fairly well. We had an ultrasound done which was negative for an acute DVT and it showed chronic changes. So at this time we have elected to discontinue Xarelto. She has been on oral antiestrogen therapy since November 2014 has some side effects from the treatment including emotional changes for which she is on Effexor. She also complains of leg aches and pains especially at work for which she is on Neurontin and she  would like see if he can increase the dosage little bit more.  REVIEW OF SYSTEMS:   Constitutional: Denies fevers, chills or abnormal weight loss Eyes: Denies blurriness of vision Ears, nose, mouth, throat, and face: Denies mucositis or sore throat Respiratory: Denies cough, dyspnea or wheezes Cardiovascular: Denies palpitation, chest discomfort or lower extremity swelling Gastrointestinal:  Denies nausea, heartburn or change in bowel habits Skin: Denies abnormal skin rashes Lymphatics: Denies new lymphadenopathy or easy bruising Neurological:Denies numbness, tingling or new weaknesses Behavioral/Psych: Mood is stable, no new changes  All other systems were reviewed with the patient and are negative.  I have reviewed the past medical history, past surgical history, social history and family history with the patient and they are unchanged from previous note.  ALLERGIES:  is allergic to ciprofloxacin and gluten meal.  MEDICATIONS:  Current Outpatient Prescriptions  Medication Sig Dispense Refill  . alendronate (FOSAMAX) 70 MG tablet TAKE 1 TABLET BY MOUTH EVERY WEEK 12 tablet 3  . anastrozole (ARIMIDEX) 1 MG tablet TAKE 1 TABLET BY MOUTH EVERY DAY 90 tablet 1  . B Complex-C (SUPER B COMPLEX PO) Take 1 tablet by mouth daily.    . Cholecalciferol (VITAMIN D-3 PO) Take by mouth daily.    [name] gabapentin (NEURONTIN) 100 MG capsule Take 2 capsules (200 mg total) by mouth 3 (three) times daily. 540 capsule 3  . levothyroxine (SYNTHROID, LEVOTHROID) 25 MCG tablet Take 1 tablet (25 mcg total) by mouth daily. 90 tablet  3  . liothyronine (CYTOMEL) 25 MCG tablet Take 0.5 tablets (12.5 mcg total) by mouth daily. 45 tablet 3  . Multiple Vitamin (MULTIVITAMIN) tablet Take 1 tablet by mouth daily.    . prochlorperazine (COMPAZINE) 10 MG tablet Take 1 tablet (10 mg total) by mouth every 6 (six) hours as needed for nausea or vomiting. 30 tablet 3  . TOBRADEX ST 0.3-0.05 % SUSP INSTILL 1 DROP INTO LEFT EYE 4  TIMES A DAY FOR 1 WEEK  0  . venlafaxine XR (EFFEXOR-XR) 75 MG 24 hr capsule Take 2 capsules (150 mg total) by mouth daily with breakfast. 180 capsule 0  . XARELTO 20 MG TABS tablet TAKE 1 TABLET (20 MG TOTAL) BY MOUTH DAILY. 90 tablet 1   No current facility-administered medications for this visit.    PHYSICAL EXAMINATION: ECOG PERFORMANCE STATUS: 1 - Symptomatic but completely ambulatory  Filed Vitals:   01/10/15 1454  BP: 136/78  Pulse: 89  Temp: 98.1 F (36.7 C)  Resp: 20   Filed Weights   01/10/15 1454  Weight: 162 lb 9.6 oz (73.755 kg)    GENERAL:alert, no distress and comfortable SKIN: skin color, texture, turgor are normal, no rashes or significant lesions EYES: normal, Conjunctiva are pink and non-injected, sclera clear OROPHARYNX:no exudate, no erythema and lips, buccal mucosa, and tongue normal  NECK: supple, thyroid normal size, non-tender, without nodularity LYMPH:  no palpable lymphadenopathy in the cervical, axillary or inguinal LUNGS: clear to auscultation and percussion with normal breathing effort HEART: regular rate & rhythm and no murmurs and no lower extremity edema ABDOMEN:abdomen soft, non-tender and normal bowel sounds Musculoskeletal:no cyanosis of digits and no clubbing  NEURO: alert & oriented x 3 with fluent speech, no focal motor/sensory deficits   LABORATORY DATA:  I have reviewed the data as listed   Chemistry      Component Value Date/Time   NA 142 09/28/2014 1051   NA 142 08/05/2013 1206   K 4.3 09/28/2014 1051   K 3.8 08/05/2013 1206   CL 102 08/05/2013 1206   CL 105 01/06/2013 0827   CO2 26 09/28/2014 1051   CO2 25 08/05/2013 1206   BUN 10.7 09/28/2014 1051   BUN 11 08/05/2013 1206   CREATININE 0.7 09/28/2014 1051   CREATININE 0.54 08/05/2013 1206      Component Value Date/Time   CALCIUM 9.1 09/28/2014 1051   CALCIUM 9.2 08/05/2013 1206   ALKPHOS 171* 09/28/2014 1051   ALKPHOS 163* 12/04/2013 1616   AST 71* 09/28/2014 1051    AST 51* 12/04/2013 1616   ALT 97* 09/28/2014 1051   ALT 74* 12/04/2013 1616   BILITOT 0.45 09/28/2014 1051   BILITOT 0.8 12/04/2013 1616       Lab Results  Component Value Date   WBC 5.5 01/10/2015   HGB 13.8 01/10/2015   HCT 42.1 01/10/2015   MCV 86.7 01/10/2015   PLT 245 01/10/2015   NEUTROABS 2.1 01/10/2015   ASSESSMENT & PLAN:  Primary cancer of lower-inner quadrant of right female breast Right breast invasive ductal carcinoma with DCIS, grade 3, ER 2%, PR 0%, HER-2 negative, Ki-67 34%, status post bilateral mastectomies and reconstruction, adjuvant chemotherapy with before meals 4 followed by [name] Taxol 12, antiestrogen therapy with Arimidex started 05/19/2013 eventhough she has only 2% ER positivity.  Arimidex toxicities: 1. Hot flashes: For which she is taking Effexor and Neurontin benefit 2. Muscle aches and pains  Breast cancer surveillance: No role of Coumadin since she had bilateral  mastectomies and breast examination chest wall axilla 09/28/2014 is normal  DVT right leg: U/S Leg 12/15/14: Chronic deep vein thrombosis noted in the right popliteal vein. Appears unchanged from studies on 09/28/14, 03/23/14, and 11/10/13 Will discontinue xarelto. Patient understands that there is still an increased risk of blood clots. Return to clinic in 6 months for follow-up  Patient would like to increase gabapentin to 300 three times a day because of leg cramps and pain especially when she sits at work for long time. She would like to stay on Effexor 75 mg daily because deftly helps her with her emotional state.  No orders of the defined types were placed in this encounter.   The patient has a good understanding of the overall plan. she agrees with it. she will call with any problems that may develop before the next visit here.   Rulon Eisenmenger, MD

## 2015-01-10 NOTE — Telephone Encounter (Signed)
Appointments made and avs printed for pt

## 2015-02-01 ENCOUNTER — Encounter: Payer: Self-pay | Admitting: Internal Medicine

## 2015-02-03 ENCOUNTER — Other Ambulatory Visit: Payer: Self-pay | Admitting: *Deleted

## 2015-02-03 MED ORDER — GABAPENTIN 100 MG PO CAPS: 300.0000 mg | ORAL_CAPSULE | Freq: Three times a day (TID) | ORAL | Status: DC

## 2015-02-15 ENCOUNTER — Other Ambulatory Visit: Payer: Self-pay | Admitting: Hematology and Oncology

## 2015-02-16 ENCOUNTER — Telehealth: Payer: Self-pay | Admitting: *Deleted

## 2015-02-16 NOTE — Telephone Encounter (Signed)
Patient called requesting "arimidex refill be sent in under Dr. Lindi Adie.  It was last filled by Charlestine Massed.  I have run out of this medicine.

## 2015-02-16 NOTE — Telephone Encounter (Signed)
Last OV 01/10/15.  Next OV 07/06/15.  Chart Reviewed.

## 2015-02-18 ENCOUNTER — Other Ambulatory Visit: Payer: Self-pay | Admitting: *Deleted

## 2015-02-18 MED ORDER — LIOTHYRONINE SODIUM 25 MCG PO TABS: 12.5000 ug | ORAL_TABLET | Freq: Every day | ORAL | Status: DC

## 2015-03-08 ENCOUNTER — Other Ambulatory Visit: Payer: Self-pay | Admitting: Hematology and Oncology

## 2015-03-09 ENCOUNTER — Telehealth: Payer: Self-pay | Admitting: *Deleted

## 2015-03-09 DIAGNOSIS — C50311 Malignant neoplasm of lower-inner quadrant of right female breast: Secondary | ICD-10-CM

## 2015-03-09 MED ORDER — GABAPENTIN 100 MG PO CAPS: 300.0000 mg | ORAL_CAPSULE | Freq: Three times a day (TID) | ORAL | Status: DC

## 2015-03-09 NOTE — Telephone Encounter (Signed)
"  I received refill for gabapentin from Dr. Lindi Adie.  My insurance will not fill this because it's a long term medication which they require a ninety-day supply.  Can he re-order this as a ninety-day supply to [facility] at [place].?  Thanks"

## 2015-04-01 ENCOUNTER — Other Ambulatory Visit: Payer: Self-pay | Admitting: Hematology and Oncology

## 2015-04-04 NOTE — Telephone Encounter (Signed)
Chart reviewed.

## 2015-06-06 ENCOUNTER — Encounter: Payer: Self-pay | Admitting: Family Medicine

## 2015-06-06 ENCOUNTER — Ambulatory Visit (INDEPENDENT_AMBULATORY_CARE_PROVIDER_SITE_OTHER): Payer: BLUE CROSS/BLUE SHIELD | Admitting: Family Medicine

## 2015-06-06 VITALS — BP 138/78 | HR 108 | Temp 100.5°F | Wt 158.0 lb

## 2015-06-06 DIAGNOSIS — R6889 Other general symptoms and signs: Secondary | ICD-10-CM

## 2015-06-06 DIAGNOSIS — R509 Fever, unspecified: Secondary | ICD-10-CM | POA: Diagnosis not present

## 2015-06-06 DIAGNOSIS — R059 Cough, unspecified: Secondary | ICD-10-CM

## 2015-06-06 DIAGNOSIS — R05 Cough: Secondary | ICD-10-CM

## 2015-06-06 LAB — POCT INFLUENZA A/B
Influenza A, POC: NEGATIVE
Influenza B, POC: NEGATIVE

## 2015-06-06 MED ORDER — GUAIFENESIN-CODEINE 100-10 MG/5ML PO SOLN: 2.5000 mL | Freq: Four times a day (QID) | ORAL | Status: DC | PRN

## 2015-06-06 NOTE — Patient Instructions (Signed)
Cough and fever No obvious pneumonia but your symptoms are slightly more severe than I like for upper respiratory infection. Since no shortness of breath and only low grade fever- we opted to monitor until Wednesday AM- you will get a CXR bright and early on Wednesday if still having fever also if you get shortness of breath or temperature gets above 102.   For cough at night can take codeine cough syrup to help you sleep. No mucinex along with this as has it in it. Can take in day as well but cannot drive for at least 6 hours after taking.   Can schedule tylenol 650 or 1000mg  with meals to help with temperature elevation

## 2015-06-06 NOTE — Progress Notes (Signed)
PCP: Garret Reddish, MD  Subjective:  Nancy Beard is a 59 y.o. year old very pleasant female patient who presents with Upper Respiratory infection symptoms including nasal congestion, sore throat, cough   Flu shot Wednesday last week Saturday started with cough dry at first, started mucinex- progressively worsened and became productive. Very fatigued Fever first day yesterday up to 99.6.  Some headache and sinus congestion. Some ear pressure No other treatments or antipyretics trialed No sick contacts but does work at [employer] and potential exposure.  Symptoms seem to be worsening  ROS-denies SOB, NVD, tooth pain. No sinus pain.   Pertinent Past Medical History-  Patient Active Problem List   Diagnosis Date Noted  . DVT (deep venous thrombosis) ([place]) on Xarelto 06/30/2013    Priority: High  . Primary cancer of lower-inner quadrant of right female breast ([place]) 06/24/2012    Priority: High  . Genital herpes 09/06/2014    Priority: Medium  . Vulvar intraepithelial neoplasia III (VIN III) 06/30/2013    Priority: Medium  . Osteoporosis 05/13/2013    Priority: Medium  . Hyperlipidemia 05/13/2008    Priority: Medium  . Hypothyroidism 01/31/2007    Priority: Medium  . Depression 01/31/2007    Priority: Medium  . Celiac disease/sprue 01/31/2007    Priority: Medium  . Neuropathy ([place]) 09/06/2014    Priority: Low  . Anticoagulant long-term use 05/01/2014    Priority: Low  . Persistent headaches 02/22/2014    Priority: Low  . Hot flashes related to aromatase inhibitor therapy 12/10/2013    Priority: Low    Medications- reviewed  Current Outpatient Prescriptions  Medication Sig Dispense Refill  . alendronate (FOSAMAX) 70 MG tablet TAKE 1 TABLET BY MOUTH EVERY WEEK 12 tablet 3  . anastrozole (ARIMIDEX) 1 MG tablet TAKE 1 TABLET BY MOUTH EVERY DAY 90 tablet 1  . B Complex-C (SUPER B COMPLEX PO) Take 1 tablet by mouth daily.    . Cholecalciferol (VITAMIN D-3 PO) Take by mouth  daily.    [name] gabapentin (NEURONTIN) 100 MG capsule Take 3 capsules (300 mg total) by mouth 3 (three) times daily. 837 capsule 3  . levothyroxine (SYNTHROID, LEVOTHROID) 25 MCG tablet Take 1 tablet (25 mcg total) by mouth daily. 90 tablet 3  . liothyronine (CYTOMEL) 25 MCG tablet Take 0.5 tablets (12.5 mcg total) by mouth daily. 45 tablet 3  . Multiple Vitamin (MULTIVITAMIN) tablet Take 1 tablet by mouth daily.    . prochlorperazine (COMPAZINE) 10 MG tablet Take 1 tablet (10 mg total) by mouth every 6 (six) hours as needed for nausea or vomiting. 30 tablet 3  . TOBRADEX ST 0.3-0.05 % SUSP INSTILL 1 DROP INTO LEFT EYE 4 TIMES A DAY FOR 1 WEEK  0  . venlafaxine XR (EFFEXOR-XR) 75 MG 24 hr capsule TAKE 2 CAPSULES (150 MG TOTAL) BY MOUTH DAILY WITH BREAKFAST. 180 capsule 0  . XARELTO 20 MG TABS tablet TAKE 1 TABLET (20 MG TOTAL) BY MOUTH DAILY. 90 tablet 1  . guaiFENesin-codeine 100-10 MG/5ML syrup Take 2.5-5 mLs by mouth every 6 (six) hours as needed for cough. 180 mL 0   No current facility-administered medications for this visit.    Objective: BP 138/78 mmHg  Pulse 108  Temp(Src) 100.5 F (38.1 C)  Wt 158 lb (71.668 kg)  SpO2 96%  LMP 09/11/2012 Gen: NAD, resting comfortably HEENT: Turbinates erythematous, TM normal, pharynx mildly erythematous with no tonsilar exudate or edema, no sinus tenderness CV: RRR no murmurs rubs or  gallops Lungs: initially crackle was heard in RLL- cleared with cough (atelectasis?) otherwise CTAB no crackles, wheeze, rhonchi Abdomen: soft/nontender/nondistended/normal bowel sounds.  Ext: no edema Skin: warm, dry, no rash Neuro: grossly normal, moves all extremities  Assessment/Plan:  Cough/fever- suspect Upper Respiratory infection History and exam today are suggestive of viral URI.  I am concerned with fever and initial crackle in RLL Though could have been atelectasis. Flu swab was negative. We opted to monitor and get CXR if fever above 102, shortness of  breath, or fever curve not improving by Wednesday. Symptomatic care with codeine cough syrup.  Finally, we reviewed reasons to return to care including if symptoms worsen or persist or new concerns arise.  Orders Placed This Encounter  Procedures  . DG Chest 2 View    Standing Status: Future     Number of Occurrences:      Standing Expiration Date: 08/05/2016    Order Specific Question:  Reason for Exam (SYMPTOM  OR DIAGNOSIS REQUIRED)    Answer:  cough, fever    Order Specific Question:  Is the patient pregnant?    Answer:  No    Order Specific Question:  Preferred imaging location?    Answer:  [facility]  . POC Influenza A/B   Meds ordered this encounter  Medications  . guaiFENesin-codeine 100-10 MG/5ML syrup    Sig: Take 2.5-5 mLs by mouth every 6 (six) hours as needed for cough.    Dispense:  180 mL    Refill:  0

## 2015-06-07 ENCOUNTER — Ambulatory Visit (INDEPENDENT_AMBULATORY_CARE_PROVIDER_SITE_OTHER)
Admission: RE | Admit: 2015-06-07 | Discharge: 2015-06-07 | Disposition: A | Discharge status: Home / Self Care | Payer: BLUE CROSS/BLUE SHIELD | Source: Ambulatory Visit | Attending: Family Medicine | Admitting: Family Medicine

## 2015-06-07 ENCOUNTER — Telehealth: Payer: Self-pay | Admitting: Family Medicine

## 2015-06-07 DIAGNOSIS — R509 Fever, unspecified: Secondary | ICD-10-CM | POA: Diagnosis not present

## 2015-06-07 DIAGNOSIS — R05 Cough: Secondary | ICD-10-CM

## 2015-06-07 DIAGNOSIS — R059 Cough, unspecified: Secondary | ICD-10-CM

## 2015-06-07 NOTE — Telephone Encounter (Signed)
Pt would like xray results. Pt is still not feeling well. Please advise. Pt was seen on 06/06/15

## 2015-06-07 NOTE — Telephone Encounter (Signed)
Informed pt we will give her a call once xray has been reviewed.

## 2015-06-08 ENCOUNTER — Telehealth: Payer: Self-pay

## 2015-06-08 MED ORDER — AZITHROMYCIN 250 MG PO TABS: ORAL_TABLET | ORAL | Status: DC

## 2015-06-08 NOTE — Telephone Encounter (Signed)
FYI Nancy Beard  Day 5 of illness. Minimal improvement. Cough productive green sputum, congestion, sinus fullness though not clear pain. Discussed I continue to think this is either URI or viral sinusitis by definition. Since patient will be unable to access care here over holiday weekend- we opted to go ahead and treat with azithromycin. X 5 days. There was no PNA on CXR. We discussed that if this is bronchitis likely 4-6 weeks and antibiotics unlikely to help. Hopeful this URI and resolves within 1-2 weeks- patient aware that even if she improves on azithromycin may simply be time course of URI  If she has continued symptoms into next week, needs to be seen to be reevaluated. She also needs to return to care if she develops fever or shortness of breath- she has neither at this time with temps just in 99s

## 2015-06-08 NOTE — Telephone Encounter (Signed)
Called pt and informed her that her chest xray was negative for pneiumonia. Pt request that an antibiotic be called in because she states she does not think she will get better soon enough without an antibiotic.

## 2015-06-14 ENCOUNTER — Emergency Department (HOSPITAL_BASED_OUTPATIENT_CLINIC_OR_DEPARTMENT_OTHER): Payer: Worker's Compensation

## 2015-06-14 ENCOUNTER — Encounter (HOSPITAL_BASED_OUTPATIENT_CLINIC_OR_DEPARTMENT_OTHER): Payer: Self-pay | Admitting: Emergency Medicine

## 2015-06-14 ENCOUNTER — Inpatient Hospital Stay (HOSPITAL_BASED_OUTPATIENT_CLINIC_OR_DEPARTMENT_OTHER)
Admission: EM | Admit: 2015-06-14 | Discharge: 2015-06-24 | DRG: 488 | Disposition: A | Discharge status: Rehabilitation Facility | Payer: Worker's Compensation | Attending: Orthopedic Surgery | Admitting: Orthopedic Surgery

## 2015-06-14 DIAGNOSIS — W109XXA Fall (on) (from) unspecified stairs and steps, initial encounter: Secondary | ICD-10-CM | POA: Diagnosis present

## 2015-06-14 DIAGNOSIS — Y92510 Bank as the place of occurrence of the external cause: Secondary | ICD-10-CM

## 2015-06-14 DIAGNOSIS — F329 Major depressive disorder, single episode, unspecified: Secondary | ICD-10-CM | POA: Diagnosis present

## 2015-06-14 DIAGNOSIS — Z79811 Long term (current) use of aromatase inhibitors: Secondary | ICD-10-CM

## 2015-06-14 DIAGNOSIS — I82499 Acute embolism and thrombosis of other specified deep vein of unspecified lower extremity: Secondary | ICD-10-CM | POA: Diagnosis not present

## 2015-06-14 DIAGNOSIS — B9620 Unspecified Escherichia coli [E. coli] as the cause of diseases classified elsewhere: Secondary | ICD-10-CM | POA: Diagnosis not present

## 2015-06-14 DIAGNOSIS — Z7901 Long term (current) use of anticoagulants: Secondary | ICD-10-CM | POA: Diagnosis not present

## 2015-06-14 DIAGNOSIS — I82432 Acute embolism and thrombosis of left popliteal vein: Secondary | ICD-10-CM | POA: Diagnosis not present

## 2015-06-14 DIAGNOSIS — C50911 Malignant neoplasm of unspecified site of right female breast: Secondary | ICD-10-CM | POA: Diagnosis present

## 2015-06-14 DIAGNOSIS — S83282A Other tear of lateral meniscus, current injury, left knee, initial encounter: Secondary | ICD-10-CM | POA: Diagnosis present

## 2015-06-14 DIAGNOSIS — N390 Urinary tract infection, site not specified: Secondary | ICD-10-CM | POA: Diagnosis not present

## 2015-06-14 DIAGNOSIS — S82143A Displaced bicondylar fracture of unspecified tibia, initial encounter for closed fracture: Secondary | ICD-10-CM

## 2015-06-14 DIAGNOSIS — A499 Bacterial infection, unspecified: Secondary | ICD-10-CM | POA: Diagnosis not present

## 2015-06-14 DIAGNOSIS — Z853 Personal history of malignant neoplasm of breast: Secondary | ICD-10-CM | POA: Diagnosis not present

## 2015-06-14 DIAGNOSIS — E611 Iron deficiency: Secondary | ICD-10-CM | POA: Diagnosis not present

## 2015-06-14 DIAGNOSIS — C50919 Malignant neoplasm of unspecified site of unspecified female breast: Secondary | ICD-10-CM | POA: Diagnosis present

## 2015-06-14 DIAGNOSIS — D62 Acute posthemorrhagic anemia: Secondary | ICD-10-CM | POA: Diagnosis not present

## 2015-06-14 DIAGNOSIS — M810 Age-related osteoporosis without current pathological fracture: Secondary | ICD-10-CM | POA: Diagnosis present

## 2015-06-14 DIAGNOSIS — Y990 Civilian activity done for income or pay: Secondary | ICD-10-CM

## 2015-06-14 DIAGNOSIS — Z86718 Personal history of other venous thrombosis and embolism: Secondary | ICD-10-CM

## 2015-06-14 DIAGNOSIS — I824Z2 Acute embolism and thrombosis of unspecified deep veins of left distal lower extremity: Secondary | ICD-10-CM | POA: Diagnosis not present

## 2015-06-14 DIAGNOSIS — Z419 Encounter for procedure for purposes other than remedying health state, unspecified: Secondary | ICD-10-CM

## 2015-06-14 DIAGNOSIS — Z7983 Long term (current) use of bisphosphonates: Secondary | ICD-10-CM

## 2015-06-14 DIAGNOSIS — Z9221 Personal history of antineoplastic chemotherapy: Secondary | ICD-10-CM | POA: Diagnosis not present

## 2015-06-14 DIAGNOSIS — I82409 Acute embolism and thrombosis of unspecified deep veins of unspecified lower extremity: Secondary | ICD-10-CM | POA: Diagnosis present

## 2015-06-14 DIAGNOSIS — E213 Hyperparathyroidism, unspecified: Secondary | ICD-10-CM | POA: Diagnosis present

## 2015-06-14 DIAGNOSIS — S82202A Unspecified fracture of shaft of left tibia, initial encounter for closed fracture: Secondary | ICD-10-CM

## 2015-06-14 DIAGNOSIS — K900 Celiac disease: Secondary | ICD-10-CM | POA: Diagnosis present

## 2015-06-14 DIAGNOSIS — E559 Vitamin D deficiency, unspecified: Secondary | ICD-10-CM | POA: Diagnosis present

## 2015-06-14 DIAGNOSIS — S82409A Unspecified fracture of shaft of unspecified fibula, initial encounter for closed fracture: Secondary | ICD-10-CM

## 2015-06-14 DIAGNOSIS — E785 Hyperlipidemia, unspecified: Secondary | ICD-10-CM | POA: Diagnosis present

## 2015-06-14 DIAGNOSIS — F32A Depression, unspecified: Secondary | ICD-10-CM | POA: Diagnosis present

## 2015-06-14 DIAGNOSIS — S82142S Displaced bicondylar fracture of left tibia, sequela: Secondary | ICD-10-CM | POA: Diagnosis not present

## 2015-06-14 DIAGNOSIS — S82142A Displaced bicondylar fracture of left tibia, initial encounter for closed fracture: Secondary | ICD-10-CM | POA: Diagnosis present

## 2015-06-14 DIAGNOSIS — R2689 Other abnormalities of gait and mobility: Secondary | ICD-10-CM | POA: Diagnosis not present

## 2015-06-14 DIAGNOSIS — S82402A Unspecified fracture of shaft of left fibula, initial encounter for closed fracture: Secondary | ICD-10-CM

## 2015-06-14 DIAGNOSIS — Z79899 Other long term (current) drug therapy: Secondary | ICD-10-CM | POA: Diagnosis not present

## 2015-06-14 DIAGNOSIS — S82209A Unspecified fracture of shaft of unspecified tibia, initial encounter for closed fracture: Secondary | ICD-10-CM | POA: Diagnosis present

## 2015-06-14 DIAGNOSIS — E039 Hypothyroidism, unspecified: Secondary | ICD-10-CM | POA: Diagnosis present

## 2015-06-14 DIAGNOSIS — M79605 Pain in left leg: Secondary | ICD-10-CM | POA: Diagnosis present

## 2015-06-14 HISTORY — DX: Vitamin D deficiency, unspecified: E55.9

## 2015-06-14 HISTORY — DX: Unspecified fracture of shaft of unspecified fibula, initial encounter for closed fracture: S82.409A

## 2015-06-14 HISTORY — DX: Unspecified fracture of shaft of unspecified tibia, initial encounter for closed fracture: S82.209A

## 2015-06-14 LAB — BASIC METABOLIC PANEL
Anion gap: 9 (ref 5–15)
BUN: 11 mg/dL (ref 6–20)
CO2: 22 mmol/L (ref 22–32)
Calcium: 8.9 mg/dL (ref 8.9–10.3)
Chloride: 106 mmol/L (ref 101–111)
Creatinine, Ser: 0.59 mg/dL (ref 0.44–1.00)
GFR calc Af Amer: >60 mL/min (ref >60)
GFR calc non Af Amer: >60 mL/min (ref >60)
Glucose, Bld: 116 mg/dL — ABNORMAL HIGH (ref 65–99)
Potassium: 3.9 mmol/L (ref 3.5–5.1)
Sodium: 137 mmol/L (ref 135–145)

## 2015-06-14 LAB — URINALYSIS, ROUTINE W REFLEX MICROSCOPIC
Bilirubin Urine: NEGATIVE
Glucose, UA: NEGATIVE mg/dL
Hgb urine dipstick: NEGATIVE
Ketones, ur: NEGATIVE mg/dL
Leukocytes, UA: NEGATIVE
Nitrite: NEGATIVE
Protein, ur: NEGATIVE mg/dL
Specific Gravity, Urine: 1.012 (ref 1.005–1.030)
pH: 7.5 (ref 5.0–8.0)

## 2015-06-14 LAB — CBC WITH DIFFERENTIAL/PLATELET
Basophils Absolute: 0 10*3/uL (ref 0.0–0.1)
Basophils Relative: 0 %
Eosinophils Absolute: 0.1 10*3/uL (ref 0.0–0.7)
Eosinophils Relative: 1 %
HCT: 36.1 % (ref 36.0–46.0)
Hemoglobin: 12.3 g/dL (ref 12.0–15.0)
Lymphocytes Relative: 28 %
Lymphs Abs: 2.9 10*3/uL (ref 0.7–4.0)
MCH: 29.2 pg (ref 26.0–34.0)
MCHC: 34.1 g/dL (ref 30.0–36.0)
MCV: 85.7 fL (ref 78.0–100.0)
Monocytes Absolute: 0.9 10*3/uL (ref 0.1–1.0)
Monocytes Relative: 9 %
Neutro Abs: 6.5 10*3/uL (ref 1.7–7.7)
Neutrophils Relative %: 62 %
Platelets: 279 10*3/uL (ref 150–400)
RBC: 4.21 MIL/uL (ref 3.87–5.11)
RDW: 14.1 % (ref 11.5–15.5)
WBC: 10.4 10*3/uL (ref 4.0–10.5)

## 2015-06-14 LAB — PROTIME-INR
INR: 0.95 (ref 0.00–1.49)
Prothrombin Time: 12.9 seconds (ref 11.6–15.2)

## 2015-06-14 MED ORDER — HYDROMORPHONE HCL 1 MG/ML IJ SOLN
1.0000 mg | Freq: Once | INTRAMUSCULAR | Status: AC
  Administered 2015-06-14: 1 mg via INTRAMUSCULAR
  Filled 2015-06-14: qty 1

## 2015-06-14 MED ORDER — HYDROMORPHONE HCL 1 MG/ML IJ SOLN
1.0000 mg | Freq: Once | INTRAMUSCULAR | Status: AC
  Administered 2015-06-14: 1 mg via INTRAVENOUS

## 2015-06-14 MED ORDER — ONDANSETRON HCL 4 MG/2ML IJ SOLN
4.0000 mg | Freq: Once | INTRAMUSCULAR | Status: AC
  Administered 2015-06-14: 4 mg via INTRAVENOUS

## 2015-06-14 MED ORDER — HYDROMORPHONE HCL 1 MG/ML IJ SOLN
1.0000 mg | Freq: Once | INTRAMUSCULAR | Status: AC
  Administered 2015-06-14: 1 mg via INTRAVENOUS
  Filled 2015-06-14: qty 1

## 2015-06-14 MED ORDER — ONDANSETRON HCL 4 MG/2ML IJ SOLN
INTRAMUSCULAR | Status: AC
  Administered 2015-06-14: 4 mg via INTRAVENOUS
  Filled 2015-06-14: qty 2

## 2015-06-14 MED ORDER — HYDROMORPHONE HCL 1 MG/ML IJ SOLN
INTRAMUSCULAR | Status: AC
  Filled 2015-06-14: qty 1

## 2015-06-14 NOTE — ED Notes (Signed)
Pt's oxygen sat intermittently drops below 90% since receiving pain medication. Pt placed on oxygen 2L via [place] with oxygen sat maintaining >94%.

## 2015-06-14 NOTE — ED Notes (Signed)
Patient and her husband debated back and forth over placement of knee immobilizer and decided to not place immobilizer for fear of pain. I notified nurse. Sending device with patient for placement after surgery.

## 2015-06-14 NOTE — ED Provider Notes (Addendum)
CSN: [id number]     Arrival date & time 06/14/15  1825 History  By signing my name below, I, Nancy Beard, attest that this documentation has been prepared under the direction and in the presence of [name], MD. Electronically Signed: [name], ED Scribe. 06/14/2015. 6:31 PM.     Chief Complaint  Patient presents with  . Fall   Patient is a 59 y.o. female presenting with fall. The history is provided by the patient. No language interpreter was used.  Fall This is a new problem. The current episode started 1 to 2 hours ago. The problem occurs constantly. The problem has been gradually worsening. Pertinent negatives include no headaches. She has tried nothing for the symptoms.  HPI Comments: Nancy Beard is a 59 y.o. Female with a hx of DVT brought in by EMS who presents to the Emergency Department complaining of a fall onset one hour ago. Pt reports falling down 3 steps, landing on her left knee. She reports hitting a wall, sustaining swelling and an abrasion to the knee. She reports worsening pain with flexion and extension of the knee. She reports hitting the top of her head on the wall, but denies headache. Pt was given Fentanyl en route. Pt denies trying anything for her symptoms PTA. She denies neck pain, numbness, weakness, or LOC. She denies use of blood thinners currently.  Past Medical History  Diagnosis Date  . Thyroid disease   . Depression   . Sprue   . Breast cancer ([place])     right  . Hypothyroidism   . DVT (deep venous thrombosis) ([place]) 2014    Right lower extremity, thigh  . Wears glasses   . Pneumonia     hx of years ago  . History of chemotherapy   . Neuropathy ([facility])     of fingers and toes  . HEMORRHOIDS, EXTERNAL 05/05/2007  . DIVERTICULOSIS, COLON 05/05/2007   Past Surgical History  Procedure Laterality Date  . Esophagogastroduodenoscopy  2007    sprue  . Colonoscopy  2006  . Cryoblation of cervix      long time ago  . Breast lumpectomy       benign lump  . Total mastectomy Bilateral 09/11/2012    Procedure: bilateral MASTECTOMY;  Surgeon: Odis Hollingshead, MD;  Location: [facility];  Service: General;  Laterality: Bilateral;  . Axillary sentinel node biopsy Right 09/11/2012    Procedure: AXILLARY SENTINEL lymph NODE  BIOPSY;  Surgeon: Odis Hollingshead, MD;  Location: [facility];  Service: General;  Laterality: Right;  right nuclear medicine injection 12:30   . Breast reconstruction with placement of tissue expander and flex hd (acellular hydrated dermis) Bilateral 09/11/2012    Procedure: BREAST RECONSTRUCTION WITH PLACEMENT OF TISSUE EXPANDER AND FLEX HD (ACELLULAR HYDRATED DERMIS) ADM;  Surgeon: Theodoro Kos, DO;  Location: [facility];  Service: Plastics;  Laterality: Bilateral;  . Incision and drainage of wound Left 09/16/2012    Procedure: Left Breast Evacuation of Hematoma;  Surgeon: Theodoro Kos, DO;  Location: [facility];  Service: Plastics;  Laterality: Left;  . Portacath placement Right 10/09/2012    Procedure: US GUIDED INSERTION PORT-A-CATH;  Surgeon: Odis Hollingshead, MD;  Location: [facility];  Service: General;  Laterality: Right;  Right Subclavian Vein  . Removal of bilateral tissue expanders with placement of bilateral breast implants Bilateral 06/24/2013    Procedure: REMOVAL OF BILATERAL TISSUE EXPANDERS WITH PLACEMENT OF BILATERAL BREAST IMPLANTS;  Surgeon: Theodoro Kos, DO;  Location: [facility];  Service: Plastics;  Laterality: Bilateral;  . Vulvectomy N/A 07/14/2013    Procedure: WIDE LOCAL  EXCISION VULVAR;  Surgeon: Alvino Chapel, MD;  Location: [facility];  Service: Gynecology;  Laterality: N/A;  . Port-a-cath removal Right 08/07/2013    Procedure: REMOVAL PORT-A-CATH;  Surgeon: Odis Hollingshead, MD;  Location: [facility];  Service: General;  Laterality: Right;  . Breast reconstruction Right 09/24/2013    Procedure: REVISION OF RIGHT BREAST RECONSTRUCTION WITH REPOSITIONING  RIGHT IMPLANT, POSSIBLE EXCISION CAPSULAR CONTRACTURE AND LIPOFILLING FOR FAT GRAFTING;  Surgeon: Theodoro Kos, DO;  Location: [facility];  Service: Plastics;  Laterality: Right;  . Liposuction with lipofilling Bilateral 09/24/2013    Procedure: LIPOSUCTION WITH LIPOFILLING;  Surgeon: Theodoro Kos, DO;  Location: [facility];  Service: Plastics;  Laterality: Bilateral;  Biltalteal filling breast   Family History  Problem Relation Age of Onset  . Breast cancer Paternal Aunt     dx in her 34s  . Lymphoma Paternal Uncle   . Breast cancer Paternal Grandmother     dx >50  . Heart attack Paternal Grandfather   . Breast cancer Other     2 maternal great aunts with breast cancer >50  . Osteoporosis Mother     controlled with diet/exercise  . COPD Father    Social History  Substance Use Topics  . Smoking status: Never Smoker   . Smokeless tobacco: Never Used  . Alcohol Use: No   OB History    No data available     Review of Systems  Musculoskeletal: Positive for arthralgias.  Neurological: Negative for syncope, weakness, numbness and headaches.  All other systems reviewed and are negative.  Allergies  Ciprofloxacin and Gluten meal  Home Medications   Prior to Admission medications   Medication Sig Start Date End Date Taking? Authorizing Provider  alendronate (FOSAMAX) 70 MG tablet TAKE 1 TABLET BY MOUTH EVERY WEEK 12/03/14   Nicholas Lose, MD  anastrozole (ARIMIDEX) 1 MG tablet TAKE 1 TABLET BY MOUTH EVERY DAY 02/16/15   Nicholas Lose, MD  azithromycin (ZITHROMAX) 250 MG tablet Take 2 tabs on day 1, then 1 tab daily until finished 06/08/15   Marin Olp, MD  B Complex-C (SUPER B COMPLEX PO) Take 1 tablet by mouth daily.    Historical Provider, MD  Cholecalciferol (VITAMIN D-3 PO) Take by mouth daily.    Historical Provider, MD  gabapentin (NEURONTIN) 100 MG capsule Take 3 capsules (300 mg total) by mouth 3 (three) times daily. 03/09/15   Nicholas Lose,  MD  guaiFENesin-codeine 100-10 MG/5ML syrup Take 2.5-5 mLs by mouth every 6 (six) hours as needed for cough. 06/06/15   Marin Olp, MD  levothyroxine (SYNTHROID, LEVOTHROID) 25 MCG tablet Take 1 tablet (25 mcg total) by mouth daily. 07/05/14   Marin Olp, MD  liothyronine (CYTOMEL) 25 MCG tablet Take 0.5 tablets (12.5 mcg total) by mouth daily. 02/18/15   Marin Olp, MD  Multiple Vitamin (MULTIVITAMIN) tablet Take 1 tablet by mouth daily.    Historical Provider, MD  prochlorperazine (COMPAZINE) 10 MG tablet Take 1 tablet (10 mg total) by mouth every 6 (six) hours as needed for nausea or vomiting. 06/24/14   Nicholas Lose, MD  TOBRADEX ST 0.3-0.05 % SUSP INSTILL 1 DROP INTO LEFT EYE 4 TIMES A DAY FOR 1 WEEK 12/10/14   Historical Provider, MD  venlafaxine XR (EFFEXOR-XR) 75 MG 24 hr capsule TAKE  2 CAPSULES (150 MG TOTAL) BY MOUTH DAILY WITH BREAKFAST. 04/04/15   Nicholas Lose, MD  XARELTO 20 MG TABS tablet TAKE 1 TABLET (20 MG TOTAL) BY MOUTH DAILY. 04/01/14   Nicholas Lose, MD   BP 143/79 mmHg  Pulse 122  Temp(Src) 98.5 F (36.9 C) (Oral)  Resp 24  Ht 5\' 5"  (1.651 m)  Wt 160 lb (72.576 kg)  BMI 26.63 kg/m2  SpO2 98%  LMP 09/11/2012 Physical Exam  Constitutional: She is oriented to person, place, and time. She appears well-developed and well-nourished. No distress.  HENT:  Head: Normocephalic and atraumatic.  Eyes: Conjunctivae and EOM are normal. Pupils are equal, round, and reactive to light.  Neck: Normal range of motion.  Cardiovascular: Normal rate, regular rhythm and normal heart sounds.   Pulmonary/Chest: Effort normal and breath sounds normal.  Abdominal: Soft. She exhibits no distension. There is no tenderness.  Musculoskeletal: Normal range of motion.  There is an abrasion to the left patella. There is tenderness and swelling inferior to the patella. There is pain with any ROM, making remainder of exam difficult. Distal PMS intact  Neurological: She is alert and  oriented to person, place, and time. She has normal reflexes. No cranial nerve deficit. She exhibits normal muscle tone.  Skin: Skin is warm and dry.  Psychiatric: She has a normal mood and affect. Judgment normal.  Nursing note and vitals reviewed.   ED Course  Procedures (including critical care time) DIAGNOSTIC STUDIES: Oxygen Saturation is 98% on RA, normal by my interpretation.    COORDINATION OF CARE: 6:38 PM-Discussed treatment plan with pt at bedside and pt agreed to plan.    Labs Review Labs Reviewed - No data to display  Imaging Review No results found. I have personally reviewed and evaluated these images and lab results as part of my medical decision-making.   EKG Interpretation None      MDM   Final diagnoses:  None    X-rays reveal a fracture of the proximal tibia and fibula. I've discussed these findings with Dr. Alvan Dame from orthopedics who is recommending [facility] and orthopedic repair. Dr. Alvan Dame has recommended applying a knee immobilizer, however the patient is unable to extend her leg far enough for this to be applied. She appears most comfortable in her current flexed position. She has good pulses, motor, and sensory and I will leave her leg in the current position.   I personally performed the services described in this documentation, which was scribed in my presence. The recorded information has been reviewed and is accurate.       [name], MD 06/14/15 [id number]  [name], MD 06/14/15 2251

## 2015-06-14 NOTE — ED Notes (Signed)
Unable to place knee immobilizer. Pt unable to tolerate straightening of leg. MD aware.

## 2015-06-14 NOTE — ED Notes (Signed)
Pt fell down hitting knee, states she hit on wall, no loc, left knee swollen with small abrasion

## 2015-06-14 NOTE — ED Notes (Signed)
[facility] here for transport at this time.  

## 2015-06-14 NOTE — ED Notes (Signed)
Pt fell going down steps, she lost balance with 3 steps left and stumbled down landing on left knee

## 2015-06-15 ENCOUNTER — Inpatient Hospital Stay (HOSPITAL_COMMUNITY): Payer: Worker's Compensation

## 2015-06-15 ENCOUNTER — Encounter (HOSPITAL_COMMUNITY)
Admission: EM | Disposition: A | Discharge status: Rehabilitation Facility | Payer: Self-pay | Source: Home / Self Care | Attending: Orthopedic Surgery

## 2015-06-15 ENCOUNTER — Inpatient Hospital Stay (HOSPITAL_COMMUNITY): Payer: Worker's Compensation | Admitting: Certified Registered Nurse Anesthetist

## 2015-06-15 ENCOUNTER — Encounter (HOSPITAL_COMMUNITY): Payer: Self-pay | Admitting: Certified Registered Nurse Anesthetist

## 2015-06-15 DIAGNOSIS — S82142A Displaced bicondylar fracture of left tibia, initial encounter for closed fracture: Secondary | ICD-10-CM | POA: Diagnosis present

## 2015-06-15 HISTORY — DX: Displaced bicondylar fracture of left tibia, initial encounter for closed fracture: S82.142A

## 2015-06-15 HISTORY — PX: EXTERNAL FIXATION LEG: SHX1549

## 2015-06-15 LAB — COMPREHENSIVE METABOLIC PANEL
ALT: 111 U/L — ABNORMAL HIGH (ref 14–54)
AST: 70 U/L — ABNORMAL HIGH (ref 15–41)
Albumin: 3.6 g/dL (ref 3.5–5.0)
Alkaline Phosphatase: 166 U/L — ABNORMAL HIGH (ref 38–126)
Anion gap: 7 (ref 5–15)
BUN: 7 mg/dL (ref 6–20)
CO2: 27 mmol/L (ref 22–32)
Calcium: 8.5 mg/dL — ABNORMAL LOW (ref 8.9–10.3)
Chloride: 104 mmol/L (ref 101–111)
Creatinine, Ser: 0.64 mg/dL (ref 0.44–1.00)
GFR calc Af Amer: >60 mL/min (ref >60)
GFR calc non Af Amer: >60 mL/min (ref >60)
Glucose, Bld: 132 mg/dL — ABNORMAL HIGH (ref 65–99)
Potassium: 3.9 mmol/L (ref 3.5–5.1)
Sodium: 138 mmol/L (ref 135–145)
Total Bilirubin: 0.7 mg/dL (ref 0.3–1.2)
Total Protein: 6.3 g/dL — ABNORMAL LOW (ref 6.5–8.1)

## 2015-06-15 LAB — CBC WITH DIFFERENTIAL/PLATELET
Basophils Absolute: 0 10*3/uL (ref 0.0–0.1)
Basophils Relative: 0 %
Eosinophils Absolute: 0.1 10*3/uL (ref 0.0–0.7)
Eosinophils Relative: 1 %
HCT: 35.3 % — ABNORMAL LOW (ref 36.0–46.0)
Hemoglobin: 11.9 g/dL — ABNORMAL LOW (ref 12.0–15.0)
Lymphocytes Relative: 25 %
Lymphs Abs: 2.4 10*3/uL (ref 0.7–4.0)
MCH: 29.9 pg (ref 26.0–34.0)
MCHC: 33.7 g/dL (ref 30.0–36.0)
MCV: 88.7 fL (ref 78.0–100.0)
Monocytes Absolute: 1.5 10*3/uL — ABNORMAL HIGH (ref 0.1–1.0)
Monocytes Relative: 16 %
Neutro Abs: 5.6 10*3/uL (ref 1.7–7.7)
Neutrophils Relative %: 58 %
Platelets: 249 10*3/uL (ref 150–400)
RBC: 3.98 MIL/uL (ref 3.87–5.11)
RDW: 14.4 % (ref 11.5–15.5)
WBC: 9.6 10*3/uL (ref 4.0–10.5)

## 2015-06-15 LAB — TSH: TSH: 4.474 u[IU]/mL (ref 0.350–4.500)

## 2015-06-15 LAB — TRANSFERRIN: Transferrin: 252 mg/dL (ref 192–382)

## 2015-06-15 LAB — PROTIME-INR
INR: 1.07 (ref 0.00–1.49)
Prothrombin Time: 14.1 seconds (ref 11.6–15.2)

## 2015-06-15 LAB — MAGNESIUM: Magnesium: 2 mg/dL (ref 1.7–2.4)

## 2015-06-15 LAB — APTT: aPTT: 26 seconds (ref 24–37)

## 2015-06-15 LAB — PHOSPHORUS: Phosphorus: 3.2 mg/dL (ref 2.5–4.6)

## 2015-06-15 LAB — PREALBUMIN: Prealbumin: 23.9 mg/dL (ref 18–38)

## 2015-06-15 SURGERY — EXTERNAL FIXATION, LOWER EXTREMITY
Anesthesia: General | Site: Leg Lower | Laterality: Left

## 2015-06-15 MED ORDER — HYDROCODONE-ACETAMINOPHEN 5-325 MG PO TABS
1.0000 | ORAL_TABLET | ORAL | Status: DC | PRN
  Administered 2015-06-15: 2 via ORAL
  Filled 2015-06-15: qty 2

## 2015-06-15 MED ORDER — NEOSTIGMINE METHYLSULFATE 10 MG/10ML IV SOLN
INTRAVENOUS | Status: DC | PRN
  Administered 2015-06-15: 3 mg via INTRAVENOUS

## 2015-06-15 MED ORDER — FENTANYL CITRATE (PF) 100 MCG/2ML IJ SOLN
25.0000 ug | INTRAMUSCULAR | Status: DC | PRN
  Administered 2015-06-15 (×5): 25 ug via INTRAVENOUS

## 2015-06-15 MED ORDER — GLYCOPYRROLATE 0.2 MG/ML IJ SOLN
INTRAMUSCULAR | Status: DC | PRN
  Administered 2015-06-15: 0.4 mg via INTRAVENOUS
  Administered 2015-06-15: 0.1 mg via INTRAVENOUS

## 2015-06-15 MED ORDER — DIPHENHYDRAMINE HCL 50 MG/ML IJ SOLN
25.0000 mg | Freq: Three times a day (TID) | INTRAMUSCULAR | Status: DC | PRN
  Administered 2015-06-15 – 2015-06-20 (×4): 25 mg via INTRAVENOUS
  Filled 2015-06-15 (×4): qty 1

## 2015-06-15 MED ORDER — MIDAZOLAM HCL 2 MG/2ML IJ SOLN
INTRAMUSCULAR | Status: DC | PRN
  Administered 2015-06-15: 2 mg via INTRAVENOUS

## 2015-06-15 MED ORDER — ONDANSETRON HCL 4 MG/2ML IJ SOLN
INTRAMUSCULAR | Status: AC
  Filled 2015-06-15: qty 2

## 2015-06-15 MED ORDER — ONDANSETRON HCL 4 MG/2ML IJ SOLN
INTRAMUSCULAR | Status: DC | PRN
  Administered 2015-06-15: 4 mg via INTRAVENOUS

## 2015-06-15 MED ORDER — HYDROMORPHONE HCL 1 MG/ML IJ SOLN: 1.0000 mg | INTRAMUSCULAR | Status: DC | PRN

## 2015-06-15 MED ORDER — ONDANSETRON HCL 4 MG/2ML IJ SOLN: 4.0000 mg | Freq: Three times a day (TID) | INTRAMUSCULAR | Status: AC | PRN

## 2015-06-15 MED ORDER — METHOCARBAMOL 500 MG PO TABS
500.0000 mg | ORAL_TABLET | Freq: Four times a day (QID) | ORAL | Status: DC | PRN
  Administered 2015-06-15: 500 mg via ORAL
  Filled 2015-06-15: qty 1

## 2015-06-15 MED ORDER — CEFAZOLIN SODIUM-DEXTROSE 2-3 GM-% IV SOLR
INTRAVENOUS | Status: DC | PRN
  Administered 2015-06-15: 2 g via INTRAVENOUS

## 2015-06-15 MED ORDER — OXYCODONE HCL 5 MG PO TABS
5.0000 mg | ORAL_TABLET | Freq: Once | ORAL | Status: AC | PRN
  Administered 2015-06-15: 5 mg via ORAL

## 2015-06-15 MED ORDER — MIDAZOLAM HCL 2 MG/2ML IJ SOLN
INTRAMUSCULAR | Status: AC
  Filled 2015-06-15: qty 2

## 2015-06-15 MED ORDER — HYDROMORPHONE HCL 1 MG/ML IJ SOLN
1.0000 mg | INTRAMUSCULAR | Status: DC | PRN
  Administered 2015-06-15 (×2): 1 mg via INTRAVENOUS
  Filled 2015-06-15 (×2): qty 1

## 2015-06-15 MED ORDER — PROMETHAZINE HCL 25 MG PO TABS: 12.5000 mg | ORAL_TABLET | Freq: Four times a day (QID) | ORAL | Status: DC | PRN

## 2015-06-15 MED ORDER — HYDROMORPHONE HCL 1 MG/ML IJ SOLN
0.5000 mg | INTRAMUSCULAR | Status: DC | PRN
  Administered 2015-06-15 (×2): 2 mg via INTRAVENOUS
  Filled 2015-06-15 (×2): qty 2

## 2015-06-15 MED ORDER — FENTANYL CITRATE (PF) 250 MCG/5ML IJ SOLN
INTRAMUSCULAR | Status: DC | PRN
  Administered 2015-06-15: 100 ug via INTRAVENOUS
  Administered 2015-06-15: 150 ug via INTRAVENOUS

## 2015-06-15 MED ORDER — SODIUM CHLORIDE 0.9 % IV SOLN
INTRAVENOUS | Status: DC
  Administered 2015-06-15 – 2015-06-16 (×3): via INTRAVENOUS

## 2015-06-15 MED ORDER — PHENYLEPHRINE HCL 10 MG/ML IJ SOLN
10.0000 mg | INTRAVENOUS | Status: DC | PRN
  Administered 2015-06-15: 50 ug/min via INTRAVENOUS

## 2015-06-15 MED ORDER — PROPOFOL 10 MG/ML IV BOLUS
INTRAVENOUS | Status: AC
  Filled 2015-06-15: qty 20

## 2015-06-15 MED ORDER — FENTANYL CITRATE (PF) 100 MCG/2ML IJ SOLN
INTRAMUSCULAR | Status: AC
  Filled 2015-06-15: qty 2

## 2015-06-15 MED ORDER — ROCURONIUM BROMIDE 50 MG/5ML IV SOLN
INTRAVENOUS | Status: AC
  Filled 2015-06-15: qty 1

## 2015-06-15 MED ORDER — LACTATED RINGERS IV SOLN
INTRAVENOUS | Status: DC
  Administered 2015-06-15 (×2): via INTRAVENOUS

## 2015-06-15 MED ORDER — LIDOCAINE HCL (CARDIAC) 20 MG/ML IV SOLN
INTRAVENOUS | Status: DC | PRN
  Administered 2015-06-15: 60 mg via INTRAVENOUS

## 2015-06-15 MED ORDER — OXYCODONE HCL 5 MG PO TABS
ORAL_TABLET | ORAL | Status: AC
  Filled 2015-06-15: qty 1

## 2015-06-15 MED ORDER — MORPHINE SULFATE (PF) 2 MG/ML IV SOLN
2.0000 mg | INTRAVENOUS | Status: DC | PRN
  Administered 2015-06-16 (×2): 2 mg via INTRAVENOUS
  Filled 2015-06-15 (×2): qty 1

## 2015-06-15 MED ORDER — METHOCARBAMOL 500 MG PO TABS
500.0000 mg | ORAL_TABLET | Freq: Four times a day (QID) | ORAL | Status: DC | PRN
  Administered 2015-06-16: 1000 mg via ORAL
  Administered 2015-06-16: 500 mg via ORAL
  Administered 2015-06-16 – 2015-06-19 (×6): 1000 mg via ORAL
  Filled 2015-06-15 (×8): qty 2
  Filled 2015-06-15: qty 1

## 2015-06-15 MED ORDER — SODIUM CHLORIDE 0.9 % IR SOLN
Status: DC | PRN
Start: 2015-06-15 — End: 2015-06-15
  Administered 2015-06-15: 1000 mL

## 2015-06-15 MED ORDER — FENTANYL CITRATE (PF) 250 MCG/5ML IJ SOLN
INTRAMUSCULAR | Status: AC
  Filled 2015-06-15: qty 5

## 2015-06-15 MED ORDER — OXYCODONE-ACETAMINOPHEN 5-325 MG PO TABS
1.0000 | ORAL_TABLET | Freq: Four times a day (QID) | ORAL | Status: DC | PRN
  Administered 2015-06-15 – 2015-06-17 (×6): 2 via ORAL
  Administered 2015-06-18 – 2015-06-22 (×4): 1 via ORAL
  Administered 2015-06-22 – 2015-06-24 (×9): 2 via ORAL
  Filled 2015-06-15: qty 1
  Filled 2015-06-15 (×4): qty 2
  Filled 2015-06-15: qty 1
  Filled 2015-06-15 (×2): qty 2
  Filled 2015-06-15: qty 1
  Filled 2015-06-15 (×4): qty 2
  Filled 2015-06-15 (×2): qty 1
  Filled 2015-06-15 (×2): qty 2
  Filled 2015-06-15: qty 1
  Filled 2015-06-15 (×3): qty 2

## 2015-06-15 MED ORDER — LIDOCAINE HCL (CARDIAC) 20 MG/ML IV SOLN
INTRAVENOUS | Status: AC
  Filled 2015-06-15: qty 5

## 2015-06-15 MED ORDER — ROCURONIUM BROMIDE 100 MG/10ML IV SOLN
INTRAVENOUS | Status: DC | PRN
  Administered 2015-06-15: 50 mg via INTRAVENOUS

## 2015-06-15 MED ORDER — ALUM & MAG HYDROXIDE-SIMETH 200-200-20 MG/5ML PO SUSP: 30.0000 mL | Freq: Four times a day (QID) | ORAL | Status: DC | PRN

## 2015-06-15 MED ORDER — ARTIFICIAL TEARS OP OINT
TOPICAL_OINTMENT | OPHTHALMIC | Status: AC
  Filled 2015-06-15: qty 3.5

## 2015-06-15 MED ORDER — DOCUSATE SODIUM 100 MG PO CAPS
100.0000 mg | ORAL_CAPSULE | Freq: Two times a day (BID) | ORAL | Status: DC
  Administered 2015-06-16 – 2015-06-24 (×16): 100 mg via ORAL
  Filled 2015-06-15 (×15): qty 1

## 2015-06-15 MED ORDER — OXYCODONE HCL 5 MG PO TABS
5.0000 mg | ORAL_TABLET | ORAL | Status: DC | PRN
  Administered 2015-06-16: 10 mg via ORAL
  Administered 2015-06-16: 5 mg via ORAL
  Administered 2015-06-16 – 2015-06-17 (×3): 10 mg via ORAL
  Administered 2015-06-17 – 2015-06-19 (×5): 5 mg via ORAL
  Administered 2015-06-19 (×4): 10 mg via ORAL
  Filled 2015-06-15 (×2): qty 2
  Filled 2015-06-15: qty 1
  Filled 2015-06-15: qty 2
  Filled 2015-06-15: qty 1
  Filled 2015-06-15 (×6): qty 2
  Filled 2015-06-15: qty 1
  Filled 2015-06-15: qty 2
  Filled 2015-06-15 (×2): qty 1

## 2015-06-15 MED ORDER — PROPOFOL 10 MG/ML IV BOLUS
INTRAVENOUS | Status: DC | PRN
  Administered 2015-06-15: 120 mg via INTRAVENOUS

## 2015-06-15 MED ORDER — OXYCODONE HCL 5 MG/5ML PO SOLN: 5.0000 mg | Freq: Once | ORAL | Status: AC | PRN

## 2015-06-15 MED ORDER — ONDANSETRON HCL 4 MG/2ML IJ SOLN: 4.0000 mg | Freq: Once | INTRAMUSCULAR | Status: DC | PRN

## 2015-06-15 MED ORDER — GLYCOPYRROLATE 0.2 MG/ML IJ SOLN
INTRAMUSCULAR | Status: AC
  Filled 2015-06-15: qty 1

## 2015-06-15 SURGICAL SUPPLY — 65 items
BANDAGE ELASTIC 4 VELCRO ST LF (GAUZE/BANDAGES/DRESSINGS) ×2 IMPLANT
BANDAGE ELASTIC 6 VELCRO ST LF (GAUZE/BANDAGES/DRESSINGS) ×2 IMPLANT
BANDAGE ESMARK 6X9 LF (GAUZE/BANDAGES/DRESSINGS) ×1 IMPLANT
BAR EXFX 500X11 NS LF (MISCELLANEOUS) ×2
BAR GLASS FIBER EXFX 11X500 (MISCELLANEOUS) ×2 IMPLANT
BNDG CMPR 9X6 STRL LF SNTH (GAUZE/BANDAGES/DRESSINGS) ×1
BNDG COHESIVE 6X5 TAN STRL LF (GAUZE/BANDAGES/DRESSINGS) ×2 IMPLANT
BNDG ESMARK 6X9 LF (GAUZE/BANDAGES/DRESSINGS) ×2
BNDG GAUZE ELAST 4 BULKY (GAUZE/BANDAGES/DRESSINGS) ×4 IMPLANT
BRUSH SCRUB DISP (MISCELLANEOUS) ×4 IMPLANT
CLEANER TIP ELECTROSURG 2X2 (MISCELLANEOUS) ×2 IMPLANT
COVER SURGICAL LIGHT HANDLE (MISCELLANEOUS) ×4 IMPLANT
CUFF TOURNIQUET SINGLE 18IN (TOURNIQUET CUFF) IMPLANT
CUFF TOURNIQUET SINGLE 24IN (TOURNIQUET CUFF) IMPLANT
CUFF TOURNIQUET SINGLE 34IN LL (TOURNIQUET CUFF) IMPLANT
DRAPE C-ARM 42X72 X-RAY (DRAPES) IMPLANT
DRAPE C-ARMOR (DRAPES) ×2 IMPLANT
DRAPE U-SHAPE 47X51 STRL (DRAPES) ×2 IMPLANT
DRSG ADAPTIC 3X8 NADH LF (GAUZE/BANDAGES/DRESSINGS) ×2 IMPLANT
ELECT REM PT RETURN 9FT ADLT (ELECTROSURGICAL) ×2
ELECTRODE REM PT RTRN 9FT ADLT (ELECTROSURGICAL) ×1 IMPLANT
EVACUATOR 1/8 PVC DRAIN (DRAIN) IMPLANT
GAUZE SPONGE 4X4 12PLY STRL (GAUZE/BANDAGES/DRESSINGS) ×2 IMPLANT
GLOVE BIO SURGEON STRL SZ7.5 (GLOVE) ×2 IMPLANT
GLOVE BIO SURGEON STRL SZ8 (GLOVE) ×2 IMPLANT
GLOVE BIOGEL PI IND STRL 6.5 (GLOVE) IMPLANT
GLOVE BIOGEL PI IND STRL 7.5 (GLOVE) ×1 IMPLANT
GLOVE BIOGEL PI IND STRL 8 (GLOVE) ×1 IMPLANT
GLOVE BIOGEL PI INDICATOR 6.5 (GLOVE) ×1
GLOVE BIOGEL PI INDICATOR 7.5 (GLOVE) ×1
GLOVE BIOGEL PI INDICATOR 8 (GLOVE) ×1
GOWN STRL REUS W/ TWL LRG LVL3 (GOWN DISPOSABLE) ×2 IMPLANT
GOWN STRL REUS W/ TWL XL LVL3 (GOWN DISPOSABLE) ×1 IMPLANT
GOWN STRL REUS W/TWL LRG LVL3 (GOWN DISPOSABLE) ×4
GOWN STRL REUS W/TWL XL LVL3 (GOWN DISPOSABLE) ×2
HANDPIECE INTERPULSE COAX TIP (DISPOSABLE)
KIT BASIN OR (CUSTOM PROCEDURE TRAY) ×2 IMPLANT
KIT ROOM TURNOVER OR (KITS) ×2 IMPLANT
MANIFOLD NEPTUNE II (INSTRUMENTS) ×2 IMPLANT
NEEDLE 22X1 1/2 (OR ONLY) (NEEDLE) IMPLANT
NS IRRIG 1000ML POUR BTL (IV SOLUTION) ×2 IMPLANT
PACK ORTHO EXTREMITY (CUSTOM PROCEDURE TRAY) ×2 IMPLANT
PAD ARMBOARD 7.5X6 YLW CONV (MISCELLANEOUS) ×4 IMPLANT
PADDING CAST COTTON 6X4 STRL (CAST SUPPLIES) ×6 IMPLANT
PIN CLAMP 2BAR 75MM BLUE (PIN) ×2 IMPLANT
PIN HALF YELLOW 5X160X35 (PIN) ×4 IMPLANT
SET HNDPC FAN SPRY TIP SCT (DISPOSABLE) IMPLANT
SPONGE LAP 18X18 X RAY DECT (DISPOSABLE) ×2 IMPLANT
SPONGE SCRUB IODOPHOR (GAUZE/BANDAGES/DRESSINGS) ×2 IMPLANT
STAPLER VISISTAT 35W (STAPLE) ×1 IMPLANT
STOCKINETTE IMPERVIOUS LG (DRAPES) ×2 IMPLANT
STRIP CLOSURE SKIN 1/2X4 (GAUZE/BANDAGES/DRESSINGS) IMPLANT
SUCTION FRAZIER TIP 10 FR DISP (SUCTIONS) IMPLANT
SUT ETHILON 3 0 PS 1 (SUTURE) IMPLANT
SUT VIC AB 0 CT1 27 (SUTURE) ×4
SUT VIC AB 0 CT1 27XBRD ANBCTR (SUTURE) ×2 IMPLANT
SUT VIC AB 2-0 CT1 27 (SUTURE) ×4
SUT VIC AB 2-0 CT1 TAPERPNT 27 (SUTURE) ×2 IMPLANT
SYR CONTROL 10ML LL (SYRINGE) IMPLANT
TOWEL OR 17X24 6PK STRL BLUE (TOWEL DISPOSABLE) ×4 IMPLANT
TOWEL OR 17X26 10 PK STRL BLUE (TOWEL DISPOSABLE) ×4 IMPLANT
TUBE CONNECTING 12X1/4 (SUCTIONS) ×2 IMPLANT
UNDERPAD 30X30 INCONTINENT (UNDERPADS AND DIAPERS) ×2 IMPLANT
WATER STERILE IRR 1000ML POUR (IV SOLUTION) ×4 IMPLANT
YANKAUER SUCT BULB TIP NO VENT (SUCTIONS) ×2 IMPLANT

## 2015-06-15 NOTE — Progress Notes (Signed)
Nursing called concerning the patient itching after having IV dilaudid at 6pm.  Ordered benadryl and changed dilaudid to morphine.  Patient also have oxycodone ordered for pain.

## 2015-06-15 NOTE — Progress Notes (Signed)
Patient arrived on unit via stretcher with [facility]. Patient alert and oriented x4. Patient oriented to staff, room and unit. Patient skin assessment completed with two RNs, no skin issues noted. Patient denies pain. Patient's IV clean, dry and intact. Safety Fall Prevention Plan was given, discussed and signed by patient. Verbal orders given, placed and have been reviewed and implemented.  Call light has been placed within reach and bed alarm has been activated. RN will continue to monitor the patient.  Nena Polio BSN, RN  Phone Number: [phone]

## 2015-06-15 NOTE — Anesthesia Postprocedure Evaluation (Signed)
Anesthesia Post Note  Patient: Nancy Beard  Procedure(s) Performed: Procedure(s) (LRB): EXTERNAL FIXATION LEG (Left)  Patient location during evaluation: PACU Anesthesia Type: General Level of consciousness: awake and awake and alert Pain management: pain level not controlled Vital Signs Assessment: post-procedure vital signs reviewed and stable Respiratory status: spontaneous breathing and nonlabored ventilation Anesthetic complications: no    Last Vitals:  Filed Vitals:   06/15/15 1000 06/15/15 1559  BP: 124/75 179/72  Pulse: 110 111  Temp: 36.9 C 37.2 C  Resp: 18 16    Last Pain:  Filed Vitals:   06/15/15 1644  PainSc: 10-Worst pain ever                 Jakylah Bassinger COKER

## 2015-06-15 NOTE — Brief Op Note (Signed)
06/14/2015 - 06/15/2015  3:43 PM  PATIENT:  Nancy Beard  59 y.o. female  PRE-OPERATIVE DIAGNOSIS:  Left bicondylar tibial plateau fracture  POST-OPERATIVE DIAGNOSIS:  Left bicondylar tibial plateau fracture  PROCEDURE:  Procedure(s): 1. CLOSED REDUCTION OF LEFT BICONDYLAR TIBIAL PLATEAU 2. APPLICATION OF SPANNING EXTERNAL FIXATION LEG (Left)  SURGEON:  Surgeon(s) and Role:    * Altamese [place], MD - Primary  PHYSICIAN ASSISTANT: PA Student  ANESTHESIA:   general  I/O:  Total I/O In: 1000 [I.V.:1000] Out: -   SPECIMEN:  No Specimen  TOURNIQUET:    DICTATION: .Other Dictation: Dictation Number [id number]

## 2015-06-15 NOTE — Progress Notes (Signed)
ByOrthopedic Tech Progress Note Patient Details:  Nancy Beard 1956-05-05 [id number]  Patient ID: Nancy Beard, female   DOB: 02-23-1956, 59 y.o.   MRN: [id number] By the time ortho tech arrived to apply watson jones  To lle ; RN stated that pt was about to go to surgery  Hildred Priest 06/15/2015, 12:55 PM

## 2015-06-15 NOTE — Transfer of Care (Signed)
Immediate Anesthesia Transfer of Care Note  Patient: Nancy Beard  Procedure(s) Performed: Procedure(s): EXTERNAL FIXATION LEG (Left)  Patient Location: PACU  Anesthesia Type:General  Level of Consciousness: awake, alert , oriented and patient cooperative  Airway & Oxygen Therapy: Patient Spontanous Breathing and Patient connected to nasal cannula oxygen  Post-op Assessment: Report given to RN, Post -op Vital signs reviewed and stable and Patient moving all extremities  Post vital signs: Reviewed and stable  Last Vitals:  Filed Vitals:   06/15/15 1000 06/15/15 1559  BP: 124/75 179/72  Pulse: 110 111  Temp: 36.9 C 37.2 C  Resp: 18 16    Complications: No apparent anesthesia complications

## 2015-06-15 NOTE — H&P (Signed)
Orthopaedic Trauma Service H&P/Consult     Chief Complaint: Left tibial plateau fracture HPI:   Nancy Beard is an 59 y.o. white female. Who fell down approximately 3 steps while at work yesterday evening. 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 She was walking down some steps when she lost her footing and fell. Patient had her head against the wall and then hit her knee on the ground. Patient had immediate onset of left leg pain. She was taken to [facility] for evaluation where she was found to have a left tibial plateau fracture. X-rays were taken with the patient in about 90 of flexion. She was then transferred to [place] without a knee immobilizer and without having a reduction maneuver performed. Orthopedic trauma service was consulted this morning for definitive management. Patient seen and evaluated and 6E25. She complains of severe pain in her left knee. She denies any numbness or tingling. Denies any additional injuries elsewhere. Denies headache or visual changes. She is wearing a knee immobilizer at this time. No ice to her left leg is noted. No compressive wrap on the extremity either   Patient has a history of hypothyroidism, celiac sprue and breast cancer  History of DVT right thigh also noted  Past Medical History  Diagnosis Date  . Thyroid disease   . Depression   . Sprue   . Breast cancer ([place])     right  . Hypothyroidism   . DVT (deep venous thrombosis) ([place]) 2014    Right lower extremity, thigh  . Wears glasses   . Pneumonia     hx of years ago  . History of chemotherapy   . Neuropathy ([facility])     of fingers and toes  . HEMORRHOIDS, EXTERNAL 05/05/2007  . DIVERTICULOSIS, COLON 05/05/2007    Past Surgical History  Procedure Laterality Date  . Esophagogastroduodenoscopy  2007    sprue  . Colonoscopy  2006  . Cryoblation of cervix      long time ago  . Breast lumpectomy      benign lump  . Total mastectomy  Bilateral 09/11/2012    Procedure: bilateral MASTECTOMY;  Surgeon: Odis Hollingshead, MD;  Location: [facility];  Service: General;  Laterality: Bilateral;  . Axillary sentinel node biopsy Right 09/11/2012    Procedure: AXILLARY SENTINEL lymph NODE  BIOPSY;  Surgeon: Odis Hollingshead, MD;  Location: [facility];  Service: General;  Laterality: Right;  right nuclear medicine injection 12:30   . Breast reconstruction with placement of tissue expander and flex hd (acellular hydrated dermis) Bilateral 09/11/2012    Procedure: BREAST RECONSTRUCTION WITH PLACEMENT OF TISSUE EXPANDER AND FLEX HD (ACELLULAR HYDRATED DERMIS) ADM;  Surgeon: Theodoro Kos, DO;  Location: [facility];  Service: Plastics;  Laterality: Bilateral;  . Incision and drainage of wound Left 09/16/2012    Procedure: Left Breast Evacuation of Hematoma;  Surgeon: Theodoro Kos, DO;  Location: [facility];  Service: Plastics;  Laterality: Left;  . Portacath placement Right 10/09/2012    Procedure: US GUIDED INSERTION PORT-A-CATH;  Surgeon: Odis Hollingshead, MD;  Location: [facility];  Service: General;  Laterality: Right;  Right Subclavian Vein  . Removal of bilateral tissue expanders with placement of bilateral breast implants Bilateral 06/24/2013    Procedure: REMOVAL OF BILATERAL TISSUE EXPANDERS WITH PLACEMENT OF BILATERAL BREAST IMPLANTS;  Surgeon: Theodoro Kos, DO;  Location: [facility] [facility];  Service: Plastics;  Laterality: Bilateral;  .  Vulvectomy N/A 07/14/2013    Procedure: WIDE LOCAL  EXCISION VULVAR;  Surgeon: Alvino Chapel, MD;  Location: [facility];  Service: Gynecology;  Laterality: N/A;  . Port-a-cath removal Right 08/07/2013    Procedure: REMOVAL PORT-A-CATH;  Surgeon: Odis Hollingshead, MD;  Location: [facility];  Service: General;  Laterality: Right;  . Breast reconstruction Right 09/24/2013    Procedure: REVISION OF RIGHT BREAST RECONSTRUCTION WITH REPOSITIONING RIGHT IMPLANT, POSSIBLE EXCISION  CAPSULAR CONTRACTURE AND LIPOFILLING FOR FAT GRAFTING;  Surgeon: Theodoro Kos, DO;  Location: [facility];  Service: Plastics;  Laterality: Right;  . Liposuction with lipofilling Bilateral 09/24/2013    Procedure: LIPOSUCTION WITH LIPOFILLING;  Surgeon: Theodoro Kos, DO;  Location: [facility];  Service: Plastics;  Laterality: Bilateral;  Biltalteal filling breast    Family History  Problem Relation Age of Onset  . Breast cancer Paternal Aunt     dx in her 67s  . Lymphoma Paternal Uncle   . Breast cancer Paternal Grandmother     dx >50  . Heart attack Paternal Grandfather   . Breast cancer Other     2 maternal great aunts with breast cancer >50  . Osteoporosis Mother     controlled with diet/exercise  . COPD Father    Social History:  reports that she has never smoked. She has never used smokeless tobacco. She reports that she does not drink alcohol or use illicit drugs.  Allergies:  Allergies  Allergen Reactions  . Ciprofloxacin Itching and Other (See Comments)    Patient states she had muscle spasms  . Gluten Meal     Medications Prior to Admission  Medication Sig Dispense Refill  . alendronate (FOSAMAX) 70 MG tablet TAKE 1 TABLET BY MOUTH EVERY WEEK 12 tablet 3  . anastrozole (ARIMIDEX) 1 MG tablet TAKE 1 TABLET BY MOUTH EVERY DAY 90 tablet 1  . azithromycin (ZITHROMAX) 250 MG tablet Take 2 tabs on day 1, then 1 tab daily until finished 6 tablet 0  . B Complex-C (SUPER B COMPLEX PO) Take 1 tablet by mouth daily.    . Cholecalciferol (VITAMIN D-3 PO) Take by mouth daily.    [name] gabapentin (NEURONTIN) 100 MG capsule Take 3 capsules (300 mg total) by mouth 3 (three) times daily. 837 capsule 3  . guaiFENesin-codeine 100-10 MG/5ML syrup Take 2.5-5 mLs by mouth every 6 (six) hours as needed for cough. 180 mL 0  . levothyroxine (SYNTHROID, LEVOTHROID) 25 MCG tablet Take 1 tablet (25 mcg total) by mouth daily. 90 tablet 3  . liothyronine (CYTOMEL) 25 MCG  tablet Take 0.5 tablets (12.5 mcg total) by mouth daily. 45 tablet 3  . Multiple Vitamin (MULTIVITAMIN) tablet Take 1 tablet by mouth daily.    . prochlorperazine (COMPAZINE) 10 MG tablet Take 1 tablet (10 mg total) by mouth every 6 (six) hours as needed for nausea or vomiting. 30 tablet 3  . TOBRADEX ST 0.3-0.05 % SUSP INSTILL 1 DROP INTO LEFT EYE 4 TIMES A DAY FOR 1 WEEK  0  . venlafaxine XR (EFFEXOR-XR) 75 MG 24 hr capsule TAKE 2 CAPSULES (150 MG TOTAL) BY MOUTH DAILY WITH BREAKFAST. 180 capsule 0  . XARELTO 20 MG TABS tablet TAKE 1 TABLET (20 MG TOTAL) BY MOUTH DAILY. 90 tablet 1    Results for orders placed or performed during the hospital encounter of 06/14/15 (from the past 48 hour(s))  Basic metabolic panel     Status: Abnormal   Collection  Time: 06/14/15  9:10 PM  Result Value Ref Range   Sodium 137 135 - 145 mmol/L   Potassium 3.9 3.5 - 5.1 mmol/L   Chloride 106 101 - 111 mmol/L   CO2 22 22 - 32 mmol/L   Glucose, Bld 116 (H) 65 - 99 mg/dL   BUN 11 6 - 20 mg/dL   Creatinine, Ser 0.59 0.44 - 1.00 mg/dL   Calcium 8.9 8.9 - 10.3 mg/dL   GFR calc non Af Amer >60 >60 mL/min   GFR calc Af Amer >60 >60 mL/min    Comment: (NOTE) The eGFR has been calculated using the CKD EPI equation. This calculation has not been validated in all clinical situations. eGFR's persistently <60 mL/min signify possible Chronic Kidney Disease.    Anion gap 9 5 - 15  CBC with Differential     Status: None   Collection Time: 06/14/15  9:10 PM  Result Value Ref Range   WBC 10.4 4.0 - 10.5 K/uL   RBC 4.21 3.87 - 5.11 MIL/uL   Hemoglobin 12.3 12.0 - 15.0 g/dL   HCT 36.1 36.0 - 46.0 %   MCV 85.7 78.0 - 100.0 fL   MCH 29.2 26.0 - 34.0 pg   MCHC 34.1 30.0 - 36.0 g/dL   RDW 14.1 11.5 - 15.5 %   Platelets 279 150 - 400 K/uL   Neutrophils Relative % 62 %   Neutro Abs 6.5 1.7 - 7.7 K/uL   Lymphocytes Relative 28 %   Lymphs Abs 2.9 0.7 - 4.0 K/uL   Monocytes Relative 9 %   Monocytes Absolute 0.9 0.1 -  1.0 K/uL   Eosinophils Relative 1 %   Eosinophils Absolute 0.1 0.0 - 0.7 K/uL   Basophils Relative 0 %   Basophils Absolute 0.0 0.0 - 0.1 K/uL  Protime-INR     Status: None   Collection Time: 06/14/15  9:10 PM  Result Value Ref Range   Prothrombin Time 12.9 11.6 - 15.2 seconds   INR 0.95 0.00 - 1.49  Urinalysis, Routine w reflex microscopic (not at [facility])     Status: None   Collection Time: 06/14/15  9:20 PM  Result Value Ref Range   Color, Urine YELLOW YELLOW   APPearance CLEAR CLEAR   Specific Gravity, Urine 1.012 1.005 - 1.030   pH 7.5 5.0 - 8.0   Glucose, UA NEGATIVE NEGATIVE mg/dL   Hgb urine dipstick NEGATIVE NEGATIVE   Bilirubin Urine NEGATIVE NEGATIVE   Ketones, ur NEGATIVE NEGATIVE mg/dL   Protein, ur NEGATIVE NEGATIVE mg/dL   Nitrite NEGATIVE NEGATIVE   Leukocytes, UA NEGATIVE NEGATIVE    Comment: MICROSCOPIC NOT DONE ON URINES WITH NEGATIVE PROTEIN, BLOOD, LEUKOCYTES, NITRITE, OR GLUCOSE <1000 mg/dL.  CBC WITH DIFFERENTIAL     Status: Abnormal   Collection Time: 06/15/15 10:27 AM  Result Value Ref Range   WBC 9.6 4.0 - 10.5 K/uL   RBC 3.98 3.87 - 5.11 MIL/uL   Hemoglobin 11.9 (L) 12.0 - 15.0 g/dL   HCT 35.3 (L) 36.0 - 46.0 %   MCV 88.7 78.0 - 100.0 fL   MCH 29.9 26.0 - 34.0 pg   MCHC 33.7 30.0 - 36.0 g/dL   RDW 14.4 11.5 - 15.5 %   Platelets 249 150 - 400 K/uL   Neutrophils Relative % 58 %   Neutro Abs 5.6 1.7 - 7.7 K/uL   Lymphocytes Relative 25 %   Lymphs Abs 2.4 0.7 - 4.0 K/uL   Monocytes Relative 16 %  Monocytes Absolute 1.5 (H) 0.1 - 1.0 K/uL   Eosinophils Relative 1 %   Eosinophils Absolute 0.1 0.0 - 0.7 K/uL   Basophils Relative 0 %   Basophils Absolute 0.0 0.0 - 0.1 K/uL   Dg Knee Complete 4 Views Left  06/14/2015  CLINICAL DATA:  Left knee pain.  Left patellar pain. EXAM: LEFT KNEE - COMPLETE 4+ VIEW COMPARISON:  None. FINDINGS: There is a comminuted fracture of the medial and lateral tibial plateaus. There is approximately 8 mm of depression of  the lateral tibial plateau. There is a moderate lipohemarthrosis. There is no dislocation. There is a possible nondisplaced fracture of the fibular head. There is no other fracture. IMPRESSION: Medial and lateral tibial plateau fractures of the left knee. Electronically Signed   By: Kathreen Devoid   On: 06/14/2015 20:08    Review of Systems  Constitutional: Negative for fever and chills.  Eyes: Negative for blurred vision and double vision.  Respiratory: Negative for shortness of breath and wheezing.   Cardiovascular: Negative for chest pain and palpitations.  Gastrointestinal: Negative for nausea, vomiting and abdominal pain.  Genitourinary:       Foley  Musculoskeletal:       Left knee pain  Neurological: Negative for tingling, tremors and headaches.    Blood pressure 124/75, pulse 110, temperature 98.4 F (36.9 C), temperature source Oral, resp. rate 18, height _0  (1.651 m), weight 75.433 kg (166 lb 4.8 oz), last menstrual period 09/11/2012, SpO2 98 %.  Body mass index is 27.67 kg/(m^2).  Physical Exam  Constitutional: Vital signs are normal. She appears well-developed and well-nourished. She is cooperative.  Uncomfortable appearing  HENT:  Head: Normocephalic and atraumatic.  Mouth/Throat: Oropharynx is clear and moist and mucous membranes are normal.  Neck: Normal range of motion and full passive range of motion without pain. Neck supple.  Cardiovascular: Normal rate, regular rhythm, S1 normal, S2 normal and normal heart sounds.   Pulmonary/Chest: Effort normal and breath sounds normal. No accessory muscle usage. No respiratory distress.  Abdominal: Soft. Normal appearance and bowel sounds are normal. There is no tenderness.  Musculoskeletal:  Left lower extremity Inspection:   A knee immobilizer in place   No compressive wrap   Knee is flexed within the immobilizer   Valgus deformity    Moderate swelling left lower leg as well as large knee effusion       Hip and ankle  are unremarkable  Bony eval:    Exquisitely tender palpation over her proximal tibia as well as with any movement of her left lower leg    Hip and ankle are nontender    No pain with axial loading or logrolling of her hip  Soft tissue:    Moderate swelling to the left lower extremity    Compartments are full but compressible and nontender    No pain with passive stretching of her anterior, lateral, superficial posterior and deep posterior compartments    Knee stability not assessed due to fracture    Ankle is stable  ROM:    Good ankle range of motion     Range of motion of her knee and hip not performed  Sensation:    DPN, SPN, TN sensory functions grossly intact Motor:    EHL, FHL, anterior tibialis, posterior tibialis, peroneals and gastrocsoleus motor functions intact  Vascular:    Extremity is warm    + DP pulse    compartment exam as above  Pelvis--no traumatic wounds  or rash, no ecchymosis, stable to manual stress, nontender  Right LEx:  No traumatic wounds, ecchymosis, or rash  Nontender  No effusions  Knee stable to varus/ valgus and anterior/posterior stress  Sens DPN, SPN, TN intact  Motor EHL, ext, flex, evers 5/5  DP 2+, PT 2+, No significant edema  B UEx shoulder, elbow, wrist, digits- no skin wounds, nontender, no instability, no blocks to motion  Sens  Ax/R/M/U intact  Mot   Ax/ R/ PIN/ M/ AIN/ U intact  Rad 2+      Neurological: She is alert.  Nursing note and vitals reviewed.     Assessment/Plan  59 year old white female status post low energy fall with left tibial plateau fracture  1. Left tibial plateau fracture  Patient has profound discomfort and pain with attempted manipulation of her left leg to try to straighten it. She has pretty significant valgus deformity at this present time. Given the presence of extensive soft tissue swelling it will be at least 10-14 days before we can proceed with definitive fixation. Would strongly recommend  application of a spanning external fixator to obtain appropriate length and reduction of fracture fragments. This would also enable [place] to get better imaging studies as the injury films are suboptimal as the patient's knee is flexed to about 90 on the film. The lateral suggestive of a bicondylar plateau fracture with metaphyseal impaction we would need a better imaging studies to confirm.   Patient will be nonweightbearing 6-8 weeks after definitive fixation  Aggressive ice and elevation to the level of the heart as well as a bulky compressive wrap to help with swelling control   Patients compartments are tight but there is no pain out of proportion or pain with passive stretch. Continue to monitor closely for compartment syndrome   Will try to get to the OR this afternoon  2. Pain management:  Percocet, OxyIR and Robaxin  3. ABL anemia/Hemodynamics  Stable, continue monitor  4. Medical issues   Hypothyroid   Home meds  5. DVT/PE prophylaxis:  Will hold on Lovenox for the time being as we are monitoring her for compartment syndrome. We will likely initiate Lovenox in 48 hours.  Okay to use foot pumps  Med rec shows pt being on xarelto, patient did not mention this.  Pharmacist reconciliation   6. Metabolic Bone Disease:  Given patient's history will conduct a metabolic workup  It appears she is also on Fosamax at this time so I presume she has a diagnosis of osteoporosis  We will stop the Fosamax during acute fracture healing phase. We'll need to determine how long she has been on this as it may be time for drug holiday if she's been on at greater than 3 years  Home meds indicate that she is on low-dose vitamin D3 as well  7. Activity:  Nonweightbearing left leg  Bed with assistance patient feels up to it  8. FEN/Foley/Lines:  Npo for now  Continue with Foley  9. Impediments to fracture healing:  Fosamax use  Thyroid dysfunction  Osteoporosis  10. Dispo:  Probable OR  today for application of spanning external fixator to left knee. If we are unable to obtain OR time we will do it tomorrow  Continue to monitor for compartment syndrome  As contact us for increasing pain medication needs, changes in motor or sensory function to left leg   Patient seen and evaluated with Dr. Marcelino Scot present  Jari Pigg, PA-C [facility] [phone] (  P) 06/15/2015, 10:57 AM

## 2015-06-15 NOTE — Anesthesia Procedure Notes (Signed)
Procedure Name: Intubation Date/Time: 06/15/2015 2:30 PM Performed by: Collier Bullock Pre-anesthesia Checklist: Patient identified, Emergency Drugs available, Suction available and Patient being monitored Patient Re-evaluated:Patient Re-evaluated prior to inductionOxygen Delivery Method: Circle system utilized Preoxygenation: Pre-oxygenation with 100% oxygen Intubation Type: IV induction Ventilation: Mask ventilation without difficulty Laryngoscope Size: Mac and 3 Grade View: Grade I Tube type: Oral Tube size: 7.0 mm Number of attempts: 1 Airway Equipment and Method: Stylet Placement Confirmation: ETT inserted through vocal cords under direct vision,  positive ETCO2 and breath sounds checked- equal and bilateral Secured at: 21 cm Tube secured with: Tape Dental Injury: Teeth and Oropharynx as per pre-operative assessment

## 2015-06-15 NOTE — Anesthesia Preprocedure Evaluation (Signed)
Anesthesia Evaluation  Patient identified by MRN, date of birth, ID band Patient awake    Reviewed: Allergy & Precautions, NPO status , Patient's Chart, lab work & pertinent test results  Airway Mallampati: II  TM Distance: >3 FB Neck ROM: Full    Dental  (+) Teeth Intact, Dental Advisory Given   Pulmonary    breath sounds clear to auscultation       Cardiovascular  Rhythm:Regular Rate:Normal     Neuro/Psych    GI/Hepatic   Endo/Other    Renal/GU      Musculoskeletal   Abdominal   Peds  Hematology   Anesthesia Other Findings   Reproductive/Obstetrics                             Anesthesia Physical Anesthesia Plan  ASA: III  Anesthesia Plan: General   Post-op Pain Management:    Induction: Intravenous  Airway Management Planned: Oral ETT  Additional Equipment:   Intra-op Plan:   Post-operative Plan: Extubation in OR  Informed Consent: I have reviewed the patients History and Physical, chart, labs and discussed the procedure including the risks, benefits and alternatives for the proposed anesthesia with the patient or authorized representative who has indicated his/her understanding and acceptance.   Dental advisory given  Plan Discussed with: CRNA and Anesthesiologist  Anesthesia Plan Comments: (L. Tibial plateau fracture H/O Breast ca S/P bilateral mastectomies Previous DVT  R. LE  Plan GA)        Anesthesia Quick Evaluation

## 2015-06-16 ENCOUNTER — Encounter (HOSPITAL_COMMUNITY): Payer: Self-pay | Admitting: Orthopedic Surgery

## 2015-06-16 LAB — HEMOGLOBIN A1C
Hgb A1c MFr Bld: 6.3 % — ABNORMAL HIGH (ref 4.8–5.6)
Mean Plasma Glucose: 134 mg/dL

## 2015-06-16 LAB — VITAMIN D 25 HYDROXY (VIT D DEFICIENCY, FRACTURES): Vit D, 25-Hydroxy: 28.3 ng/mL — ABNORMAL LOW (ref 30.0–100.0)

## 2015-06-16 LAB — PTH, INTACT AND CALCIUM
Calcium, Total (PTH): 8.6 mg/dL — ABNORMAL LOW (ref 8.7–10.3)
PTH: 71 pg/mL — ABNORMAL HIGH (ref 15–65)

## 2015-06-16 MED ORDER — ENOXAPARIN SODIUM 40 MG/0.4ML ~~LOC~~ SOLN: 40.0000 mg | SUBCUTANEOUS | Status: DC

## 2015-06-16 MED ORDER — VENLAFAXINE HCL ER 150 MG PO CP24
150.0000 mg | ORAL_CAPSULE | Freq: Every day | ORAL | Status: DC
  Administered 2015-06-17 – 2015-06-24 (×8): 150 mg via ORAL
  Filled 2015-06-16 (×10): qty 1

## 2015-06-16 MED ORDER — VITAMIN D (ERGOCALCIFEROL) 1.25 MG (50000 UNIT) PO CAPS
50000.0000 [IU] | ORAL_CAPSULE | ORAL | Status: DC
  Administered 2015-06-16 – 2015-06-23 (×2): 50000 [IU] via ORAL
  Filled 2015-06-16 (×2): qty 1

## 2015-06-16 MED ORDER — LIOTHYRONINE SODIUM 25 MCG PO TABS
12.5000 ug | ORAL_TABLET | Freq: Every day | ORAL | Status: DC
  Administered 2015-06-16 – 2015-06-18 (×3): 12.5 ug via ORAL
  Filled 2015-06-16 (×4): qty 1

## 2015-06-16 MED ORDER — PROCHLORPERAZINE MALEATE 10 MG PO TABS: 10.0000 mg | ORAL_TABLET | Freq: Four times a day (QID) | ORAL | Status: DC | PRN

## 2015-06-16 MED ORDER — ANASTROZOLE 1 MG PO TABS
1.0000 mg | ORAL_TABLET | Freq: Every day | ORAL | Status: DC
  Administered 2015-06-16 – 2015-06-24 (×8): 1 mg via ORAL
  Filled 2015-06-16 (×10): qty 1

## 2015-06-16 MED ORDER — ENOXAPARIN SODIUM 40 MG/0.4ML ~~LOC~~ SOLN
40.0000 mg | SUBCUTANEOUS | Status: DC
  Administered 2015-06-17 – 2015-06-20 (×4): 40 mg via SUBCUTANEOUS
  Filled 2015-06-16 (×4): qty 0.4

## 2015-06-16 MED ORDER — LEVOTHYROXINE SODIUM 25 MCG PO TABS
25.0000 ug | ORAL_TABLET | Freq: Every day | ORAL | Status: DC
  Administered 2015-06-17 – 2015-06-24 (×8): 25 ug via ORAL
  Filled 2015-06-16 (×9): qty 1

## 2015-06-16 MED ORDER — GABAPENTIN 300 MG PO CAPS
900.0000 mg | ORAL_CAPSULE | Freq: Every day | ORAL | Status: DC
  Administered 2015-06-16 – 2015-06-23 (×8): 900 mg via ORAL
  Filled 2015-06-16 (×8): qty 3

## 2015-06-16 NOTE — Op Note (Signed)
NAMEMACLAINE, SUTHARD             ACCOUNT NO.:  [id number]  MEDICAL RECORD NO.:  [id number]  LOCATION:  6E25C                        FACILITY:  [facility]  PHYSICIAN:  Astrid Divine. Marcelino Scot, M.D. DATE OF BIRTH:  Dec 06, 1955  DATE OF PROCEDURE:  06/15/2015 DATE OF DISCHARGE:                              OPERATIVE REPORT   PREOPERATIVE DIAGNOSIS:  Left bicondylar tibial plateau fracture.  POSTOPERATIVE DIAGNOSIS:  Left bicondylar tibial plateau fracture.  PROCEDURES: 1. Closed reduction of left bicondylar tibial plateau. 2. Application of spanning external fixator, left leg.  SURGEON:  Astrid Divine. Marcelino Scot, M.D.  ASSISTANT:  PA student.  ANESTHESIA:  General.  COMPLICATIONS:  None.  DISPOSITION:  To PACU.  CONDITION:  Stable.  BRIEF SUMMARY OF INDICATION FOR PROCEDURE:  Nancy Beard is a very pleasant 59 year old female who sustained a bicondylar tibial plateau fracture and was unable to achieve realignment secondary to pain and nearly fixed deformity and severe valgus.  We did attempt a closed reduction earlier today, which could not be tolerated and was also very unstable.  We discussed with her the risks and benefits of closed reduction and application of a frame including the possibility of pin tract infection, nerve injury, vessel injury, DVT, PE, heart attack, stroke, the need for further surgery including definitive fixation of her fracture and also motion arthritis and many others.  She and her husband acknowledged these risks and did wish to proceed.  We also discussed using mechanical DVT prophylaxis intraoperatively given her right leg DVT history.  BRIEF SUMMARY OF PROCEDURE:  The patient was given preoperative antibiotics, taken to the operating room where general anesthesia was induced.  She was then transferred to the operative table and a closed reduction maneuver performed of her bicondylar plateau.  It was then held in a reduced position throughout a  chlorhexidine and standard Betadine scrub and paint.  After draping, time-out was held and the C- arm brought in and 2 femoral X-Fix pins and 2 tibial X-Fix pins placed with position confirmed under orthogonal x-ray images.  This was followed by application of clamps and then again reduction of a reduction maneuver reducing alignment on both the coronal and sagittal planes.  This was confirmed with x-ray and then had this held by my assistant while I applied the bars and tightened the clamps to the bars. Final images showed maintenance of reduction with no acute complications.  Sterile dressings were applied to the pins and then Ace wrap from foot to thigh.  The patient was then awakened from anesthesia and transported to the PACU in stable condition.  PROGNOSIS:  Nancy Beard will be nonweightbearing with ice and elevation of her left lower extremity.  With a compressive wrap, she may resolve her soft tissue swelling earlier than 1-2 weeks, but at this time, we are anticipating a date for definitive reconstruction with plate and screws.  Given her cancer history, there is a possibility that we may obtain some bone for pathologic analysis at that time and this was discussed with the family.  We also will discuss using the fixator as definitive closed treatment.  At this time, there is no lytic lesion or other suggestion of malignancy, but this could  certainly be evaluated on the CT scan which is forthcoming as well.  She will be on a pharmacologic DVT prophylaxis with Lovenox pending reconstruction.     Astrid Divine. Marcelino Scot, M.D.     MHH/MEDQ  D:  06/15/2015  T:  06/16/2015  Job:  [id number]

## 2015-06-16 NOTE — Progress Notes (Signed)
Orthopaedic Trauma Service Progress Note  Subjective  Doing ok but having moderate pain still Denies numbness or tingling  Some itching last night after dilaudid  Review of Systems  Constitutional: Negative for fever and chills.  Respiratory: Negative for shortness of breath and wheezing.   Cardiovascular: Negative for chest pain and palpitations.  Gastrointestinal: Negative for nausea and vomiting.  Neurological: Negative for tingling and sensory change.     Objective   BP 98/54 mmHg  Pulse 101  Temp(Src) 98.6 F (37 C) (Oral)  Resp 18  Ht _0  (1.651 m)  Wt 75.433 kg (166 lb 4.8 oz)  BMI 27.67 kg/m2  SpO2 98%  LMP 09/11/2012  Intake/Output      11/30 0701 - 12/01 0700 12/01 0701 - 12/02 0700   P.O. 480 240   I.V. (mL/kg) 1200 (15.9)    Total Intake(mL/kg) 1680 (22.3) 240 (3.2)   Urine (mL/kg/hr) 1200 (0.7) 1200 (3.1)   Stool 0 (0) 0 (0)   Total Output 1200 1200   Net +480 -960        Urine Occurrence 0 x 0 x   Stool Occurrence 0 x 0 x     Labs Results for Nancy Beard, Nancy Beard (MRN [id number]) as of 06/16/2015 12:05  Ref. Range 06/15/2015 10:27  Sodium Latest Ref Range: 135-145 mmol/L 138  Potassium Latest Ref Range: 3.5-5.1 mmol/L 3.9  Chloride Latest Ref Range: 101-111 mmol/L 104  CO2 Latest Ref Range: 22-32 mmol/L 27  Mean Plasma Glucose Latest Units: mg/dL 134  BUN Latest Ref Range: 6-20 mg/dL 7  Creatinine Latest Ref Range: 0.44-1.00 mg/dL 0.64  Calcium Latest Ref Range: 8.9-10.3 mg/dL 8.5 (L)  EGFR (Non-African Amer.) Latest Ref Range: >60 mL/min >60  EGFR (African American) Latest Ref Range: >60 mL/min >60  Glucose Latest Ref Range: 65-99 mg/dL 132 (H)  Anion gap Latest Ref Range: 5-15  7  Calcium, Total (PTH) Latest Ref Range: 8.7-10.3 mg/dL 8.6 (L)  Phosphorus Latest Ref Range: 2.5-4.6 mg/dL 3.2  Magnesium Latest Ref Range: 1.7-2.4 mg/dL 2.0  Alkaline Phosphatase Latest Ref Range: 38-126 U/L 166 (H)  Albumin Latest Ref Range: 3.5-5.0 g/dL 3.6   AST Latest Ref Range: 15-41 U/L 70 (H)  ALT Latest Ref Range: 14-54 U/L 111 (H)  Total Protein Latest Ref Range: 6.5-8.1 g/dL 6.3 (L)  Total Bilirubin Latest Ref Range: 0.3-1.2 mg/dL 0.7  PREALBUMIN Latest Ref Range: 18-38 mg/dL 23.9  Transferrin Latest Ref Range: 192-382 mg/dL 252  Vit D, 25-Hydroxy Latest Ref Range: 30.0-100.0 ng/mL 28.3 (L)  WBC Latest Ref Range: 4.0-10.5 K/uL 9.6  RBC Latest Ref Range: 3.87-5.11 MIL/uL 3.98  Hemoglobin Latest Ref Range: 12.0-15.0 g/dL 11.9 (L)  HCT Latest Ref Range: 36.0-46.0 % 35.3 (L)  MCV Latest Ref Range: 78.0-100.0 fL 88.7  MCH Latest Ref Range: 26.0-34.0 pg 29.9  MCHC Latest Ref Range: 30.0-36.0 g/dL 33.7  RDW Latest Ref Range: 11.5-15.5 % 14.4  Platelets Latest Ref Range: 150-400 K/uL 249  Neutrophils Latest Units: % 58  Lymphocytes Latest Units: % 25  Monocytes Relative Latest Units: % 16  Eosinophil Latest Units: % 1  Basophil Latest Units: % 0  NEUT# Latest Ref Range: 1.7-7.7 K/uL 5.6  Lymphocyte # Latest Ref Range: 0.7-4.0 K/uL 2.4  Monocyte # Latest Ref Range: 0.1-1.0 K/uL 1.5 (H)  Eosinophils Absolute Latest Ref Range: 0.0-0.7 K/uL 0.1  Basophils Absolute Latest Ref Range: 0.0-0.1 K/uL 0.0  Prothrombin Time Latest Ref Range: 11.6-15.2 seconds 14.1  INR Latest Ref Range: 0.00-1.49  1.07  APTT Latest Ref Range: 24-37 seconds 26  Hemoglobin A1C Latest Ref Range: 4.8-5.6 % 6.3 (H)  PTH Latest Ref Range: 15-65 pg/mL 71 (H)  TSH Latest Ref Range: 0.350-4.500 uIU/mL 4.474     Exam  Gen: resting comfortably in bed, NAD Lungs: breathing unlabored Cardiac: RRR Abd: NT Ext:       Left Lower Extremity   pinsite dressings with some bloody drainage  ACE stable and intact  Ex fix stable  Foot resting in flexion  DPN, SPN, TN sensations grossly intact  EHL, FHL, Lesser toe motor intact  Ankle extension, flexion, inversion, eversion grossly intact  Tolerates passive motion of ankle  Ext warm  + DP pulse  Moderate swelling     Assessment and Plan   POD/HD#: 1  59 y/o female s/p low energy fall with complex bicondylar Left tibial plateau fracture  1. Fall at work  2. Comminuted bicondylar L tibial plateaus fracture  NWB   Ok to get out of bed with assist  PT/OT evals  Ice   Elevate leg above heart  Ok to move toes and ankle  Will have foot plate fabricated    Check soft tissue tomorrow- if swelling improving will keep in hospital through weekend and proceed with definitive fixation on tues. Otherwise will dc home tomorrow with follow up in office on 06/22/2015  3. Pain management:  Adjust pain meds accordingly  Dc dilaudid  If itching persists will dc percocet and try norco  4. ABL anemia/Hemodynamics  Stable  Cbc in am   5. Medical issues   Hypothyroid   Home meds   TSH ok   6. DVT/PE prophylaxis:  Lovenox   7.ID:   periop abx  8.Metabolic Bone Disease:  Vitamin D insufficiency and mild hyperparathyroidism   Add vitamin D supplementation   Would like to obtain 1,25 vitamin d level but lab no longer offered at this institution   9.Activity:  Up with assistance  NWB Left leg  10. FEN/Foley/Lines:  Diet as tolerated   11.Ex-fix/Splint care:  Ok to manipulate leg by fixator  Dressing change tomorrow  12. Dispo:  PT/OT evals  Soft tissue check tomorrow     Jari Pigg, PA-C [facility] [phone] (O) 06/16/2015 12:04 PM

## 2015-06-17 LAB — CBC
HCT: 35.3 % — ABNORMAL LOW (ref 36.0–46.0)
Hemoglobin: 11.4 g/dL — ABNORMAL LOW (ref 12.0–15.0)
MCH: 28.9 pg (ref 26.0–34.0)
MCHC: 32.3 g/dL (ref 30.0–36.0)
MCV: 89.6 fL (ref 78.0–100.0)
Platelets: 218 10*3/uL (ref 150–400)
RBC: 3.94 MIL/uL (ref 3.87–5.11)
RDW: 14.4 % (ref 11.5–15.5)
WBC: 12.2 10*3/uL — ABNORMAL HIGH (ref 4.0–10.5)

## 2015-06-17 NOTE — Progress Notes (Signed)
Orthopaedic Trauma Service (OTS)   Subjective: Patient reports pain as moderate.    Objective: Temp:  [98.4 F (36.9 C)-99.1 F (37.3 C)] 98.7 F (37.1 C) (12/02 1000) Pulse Rate:  [90-118] 118 (12/02 1000) Resp:  [18-20] 20 (12/02 1000) BP: (110-138)/(54-72) 110/60 mmHg (12/02 1000) SpO2:  [91 %-98 %] 98 % (12/02 1000) Weight:  [154 lb 12.2 oz (70.2 kg)] 154 lb 12.2 oz (70.2 kg) (12/01 1943) Physical Exam LLE Dressing changed; wounds intact, clean, dry  Edema/ swelling controlled  Sens: DPN, SPN, TN intact  Motor: EHL, FHL, and lessor toe ext and flex all intact grossly  Brisk cap refill, warm to touch Much reduced swelling and skin almost wrinkling in anticipated area of incision  Assessment/Plan: 2 Days Post-Op Procedure(s) (LRB): EXTERNAL FIXATION LEG (Left)  1. ORIF on Mon or Tues depending on OR availability 2. Will reasses skin on Mon am to make sure resolves as anticipated 3. Foot pump left, SCD on right 4. May shower if wishes; do not anticipate need for drsg change until OR; change if drainage 5. [place], MD [facility] [phone] (p)

## 2015-06-17 NOTE — Progress Notes (Signed)
Physical Therapy Evaluation Patient Details Name: Nancy Beard MRN: [id number] DOB: 04-18-1956 Today's Date: 06/17/2015   History of Present Illness  59 y.o. female with fall resulting in fracture of left bicondylar tibial plateau, s/p Application of spanning external fixator, left leg. Patient has a history of hypothyroidism, celiac sprue, breast cancer, and RLE DVT.  Clinical Impression  Patient is s/p above surgery presenting with functional limitations due to the deficits listed below (see PT Problem List). Tolerated bed mobility and transfer training well. Slow but stable with min assist for most transitions. Extensive education for precautions, weight-bearing status, and positioning of LLE. Pt pleasantly surprised at her ability to mobilize as well as she did today. Patient will benefit from skilled PT to increase their independence and safety with mobility to allow discharge to the venue listed below.       Follow Up Recommendations  (TBD based on progression and further surgery)    Equipment Recommendations  Rolling walker with 5" wheels;Wheelchair (measurements PT);Wheelchair cushion (measurements PT)    Recommendations for Other Services       Precautions / Restrictions Precautions Precautions: Fall Required Braces or Orthoses:  (external fixator) Restrictions Weight Bearing Restrictions: Yes LLE Weight Bearing: Non weight bearing      Mobility  Bed Mobility Overal bed mobility: Needs Assistance Bed Mobility: Supine to Sit     Supine to sit: HOB elevated;Min assist     General bed mobility comments: Assist with LLE through fixator, pt able to pull through PTs hand to rise to EOB. Cues for technique. Performed very slowly.  Transfers Overall transfer level: Needs assistance Equipment used: Rolling walker (2 wheeled) Transfers: Sit to/from [facility] Sit to Stand: Min assist Stand pivot transfers: Min assist       General transfer comment:  Min assist for balance with power up from lowest bed setting. Assist for LLE control through fixator to prevent weight-bearing and bumping RW.  Mild dizziness upon standing but resolved quickly. Assist to control RW with pivot to chair and even able to take 2 small steps backwards. VC to maintain NWB through LLE which she performed well with min assist.  Ambulation/Gait                Stairs            Wheelchair Mobility    Modified Rankin (Stroke Patients Only)       Balance Overall balance assessment: Needs assistance Sitting-balance support: No upper extremity supported;Feet supported Sitting balance-Leahy Scale: Good     Standing balance support: Bilateral upper extremity supported Standing balance-Leahy Scale: Poor                               Pertinent Vitals/Pain Pain Assessment: Faces Faces Pain Scale: Hurts even more Pain Location: LLE Pain Descriptors / Indicators: Aching Pain Intervention(s): Monitored during session;Premedicated before session;Repositioned;Limited activity within patient's tolerance    Home Living Family/patient expects to be discharged to:: Private residence Living Arrangements: Spouse/significant other Available Help at Discharge: Family;Available 24 hours/day (this week) Type of Home: House Home Access: Stairs to enter Entrance Stairs-Rails: None Entrance Stairs-Number of Steps: 2+1 Home Layout: Two level;Able to live on main level with bedroom/bathroom Home Equipment: None      Prior Function Level of Independence: Independent         Comments: works Paediatric nurse [employer]   Dominant Hand: Right  Extremity/Trunk Assessment   Upper Extremity Assessment: Defer to OT evaluation           Lower Extremity Assessment: LLE deficits/detail   LLE Deficits / Details: able to move toes and ankle      Communication   Communication: No difficulties  Cognition Arousal/Alertness:  Awake/alert Behavior During Therapy: WFL for tasks assessed/performed Overall Cognitive Status: Within Functional Limits for tasks assessed                      General Comments General comments (skin integrity, edema, etc.): RLE edematous. Time spent discussing self care, home management, safety with mobility, precautions, and positioning.    Exercises General Exercises - Lower Extremity Ankle Circles/Pumps: AROM;Both;10 reps;Seated Gluteal Sets: Strengthening;Both;10 reps;Seated      Assessment/Plan    PT Assessment Patient needs continued PT services  PT Diagnosis Difficulty walking;Acute pain   PT Problem List Decreased strength;Decreased range of motion;Decreased activity tolerance;Decreased balance;Decreased mobility;Decreased knowledge of use of DME;Decreased knowledge of precautions;Pain  PT Treatment Interventions DME instruction;Gait training;Stair training;Functional mobility training;Therapeutic activities;Therapeutic exercise;Balance training;Neuromuscular re-education;Patient/family education;Modalities;Wheelchair mobility training   PT Goals (Current goals can be found in the Care Plan section) Acute Rehab PT Goals Patient Stated Goal: No pain PT Goal Formulation: With patient/family Time For Goal Achievement: 07/01/15 Potential to Achieve Goals: Good    Frequency Min 5X/week   Barriers to discharge Decreased caregiver support will only have assist from husband first week at home    Co-evaluation               End of Session Equipment Utilized During Treatment: Gait belt Activity Tolerance: Patient tolerated treatment well Patient left: in chair;with call bell/phone within reach;with family/visitor present Nurse Communication: Mobility status;Weight bearing status         Time: 1709-1750 PT Time Calculation (min) (ACUTE ONLY): 41 min   Charges:   PT Evaluation $Initial PT Evaluation Tier I: 1 Procedure PT Treatments $Therapeutic  Activity: 8-22 mins $Self Care/Home Management: 8-22   PT G Codes:        Ellouise Newer 06/17/2015, 6:09 PM Camille Bal [place], [place]

## 2015-06-18 NOTE — Progress Notes (Signed)
Subjective: 3 Days Post-Op Procedure(s) (LRB): EXTERNAL FIXATION LEG (Left) Patient reports pain as 2 on 0-10 scale and 4 on 0-10 scale.    Objective: Vital signs in last 24 hours: Temp:  [98.1 F (36.7 C)-98.7 F (37.1 C)] 98.7 F (37.1 C) (12/03 0900) Pulse Rate:  [101-118] 114 (12/03 0900) Resp:  [16-20] 18 (12/03 0900) BP: (100-112)/(55-67) 112/67 mmHg (12/03 0900) SpO2:  [92 %-98 %] 96 % (12/03 0900) Weight:  [74.39 kg (164 lb)] 74.39 kg (164 lb) (12/02 2017)  Intake/Output from previous day: 12/02 0701 - 12/03 0700 In: 1120 [P.O.:1120] Out: 1725 [Urine:1725] Intake/Output this shift: Total I/O In: 120 [P.O.:120] Out: 900 [Urine:900]   Recent Labs  06/15/15 1027 06/17/15 0715  HGB 11.9* 11.4*    Recent Labs  06/15/15 1027 06/17/15 0715  WBC 9.6 12.2*  RBC 3.98 3.94  HCT 35.3* 35.3*  PLT 249 218    Recent Labs  06/15/15 1027  NA 138  K 3.9  CL 104  CO2 27  BUN 7  CREATININE 0.64  GLUCOSE 132*  CALCIUM 8.5*  8.6*    Recent Labs  06/15/15 1027  INR 1.07    ABD soft Neurovascular intact Sensation intact distally Intact pulses distally Dorsiflexion/Plantar flexion intact  Assessment/Plan: 3 Days Post-Op Procedure(s) (LRB): EXTERNAL FIXATION LEG (Left)  Active Problems:   Fracture tibia/fibula   Fracture, tibial plateau plan to go to OR on Monday or Tuesday.  Will follow over the weekend Up with therapy  Kadesia Robel J 06/18/2015, 9:49 AM

## 2015-06-18 NOTE — Progress Notes (Signed)
Called and gave report to 5N nurse Ginger. Will transfer pt via bed.

## 2015-06-18 NOTE — Progress Notes (Signed)
Orthopedic Tech Progress Note Patient Details:  Nancy Beard February 04, 1956 [id number]  Patient ID: Nancy Beard, female   DOB: Apr 25, 1956, 59 y.o.   MRN: [id number]   [name] 06/18/2015, 1:56 PMout of trapeze bars.

## 2015-06-18 NOTE — Progress Notes (Signed)
Occupational Therapy Treatment Patient Details Name: Nancy Beard MRN: [id number] DOB: 07-Jan-1956 Today's Date: 06/18/2015    History of present illness 60 y.o. female with fall resulting in fracture of left bicondylar tibial plateau, s/p Application of spanning external fixator, left leg. Patient has a history of hypothyroidism, celiac sprue, breast cancer, and RLE DVT.   OT comments  Seen for splint check. Tolerating footplate without problems. 5N Nsg educated via phone as pt being transferred to new floor. FootPlate to be removed q 2 hours to check for  pressure and allow pt to actively move L foot. Will follow acutely.   Follow Up Recommendations  No OT follow up;Supervision/Assistance - 24 hour    Equipment Recommendations  3 in 1 bedside comode    Recommendations for Other Services      Precautions / Restrictions Precautions Precautions: Fall Required Braces or Orthoses:  (external fixator) Restrictions Weight Bearing Restrictions: Yes LLE Weight Bearing: Non weight bearing                           Cognition   Behavior During Therapy: WFL for tasks assessed/performed Overall Cognitive Status: Within Functional Limits for tasks assessed                         Exercises  given theraband for BUE strengthening    Shoulder Instructions       General Comments  Tolerating footplate without problems. Husband able to verbalize purpose of footplate and observe proper positioning to assist staff with care.     Pertinent Vitals/ Pain       Pain Assessment: 0-10 Pain Score: 6  Pain Location: LLE Pain Descriptors / Indicators: Aching Pain Intervention(s): Limited activity within patient's tolerance;Ice applied  Home Living   Prior Functioning/Environment    Frequency Min 3X/week     Progress Toward Goals  OT Goals(current goals can now be found in the care plan section)  Progress towards OT goals: Progressing toward goals  Acute Rehab OT  Goals Patient Stated Goal: No pain OT Goal Formulation: With patient Time For Goal Achievement: 07/02/15 Potential to Achieve Goals: Good ADL Goals Pt Will Perform Lower Body Bathing: with set-up;with supervision;with adaptive equipment;sit to/from stand;with caregiver independent in assisting Pt Will Perform Lower Body Dressing: with set-up;with supervision;with adaptive equipment;sit to/from stand;with caregiver independent in assisting Pt Will Transfer to Toilet: with min guard assist;bedside commode (with caregiver assisting) Pt Will Perform Toileting - Clothing Manipulation and hygiene: with set-up;sitting/lateral leans Pt Will Perform Tub/Shower Transfer: with min guard assist;ambulating;3 in 1;with caregiver independent in assisting;rolling walker;Shower transfer Pt/caregiver will Perform Home Exercise Program: Increased strength;Both right and left upper extremity;Independently Additional ADL Goal #1: Pt will tolerate L footplate splint without complications to improve proper positioning of L foot into neutral dorsiflexion  Plan Discharge plan remains appropriate    Co-evaluation                 End of Session Equipment Utilized During Treatment: Rolling walker   Activity Tolerance Patient tolerated treatment well   Patient Left in bed;with call bell/phone within reach;with family/visitor present   Nurse Communication Mobility status;Other (comment) (footplate care)        Time: [id number] OT Time Calculation (min): 15 min  Charges: OT General Charges $OT Visit: 1 Procedure $Therapeutic Activity: 8-22 mins   Tiago Humphrey,HILLARY 06/18/2015, 5:37 PM   [facility], OTR/L  [phone] 06/18/2015

## 2015-06-18 NOTE — Progress Notes (Signed)
Physical Therapy Treatment Patient Details Name: Nancy Beard MRN: [id number] DOB: [birth year] Today's Date: 06/18/2015    History of Present Illness 59 y.o. female with fall resulting in fracture of left bicondylar tibial plateau, s/p Application of spanning external fixator, left leg. Patient has a history of hypothyroidism, celiac sprue, breast cancer, and RLE DVT.    PT Comments    Patient making slow yet steady progress with therapy and mobility.  She has yet to attempt ambulation.  Advised pt on pain relief techniques including positioning and ice.  Will continue to follow patient while on this venue of care to progress mobility.   Follow Up Recommendations  Home health PT;Supervision for mobility/OOB     Equipment Recommendations  Rolling walker with 5" wheels;3in1 (PT);Wheelchair (measurements PT);Wheelchair cushion (measurements PT)    Recommendations for Other Services OT consult     Precautions / Restrictions Precautions Precautions: Fall Required Braces or Orthoses:  (external fixator) Restrictions Weight Bearing Restrictions: Yes LLE Weight Bearing: Non weight bearing    Mobility  Bed Mobility Overal bed mobility: Needs Assistance Bed Mobility: Sit to Supine       Sit to supine: Mod assist   General bed mobility comments: assist for leg management, mod cues for sequencing  Transfers Overall transfer level: Needs assistance Equipment used: Rolling walker (2 wheeled) Transfers: Sit to/from [facility] Sit to Stand: Mod assist Stand pivot transfers: Min assist       General transfer comment: mod cues for sequencing and leg management, pt's husband originally holding her leg during transfer  Ambulation/Gait                 Stairs            Wheelchair Mobility    Modified Rankin (Stroke Patients Only)       Balance Overall balance assessment: Needs assistance Sitting-balance support: No upper extremity  supported;Feet supported Sitting balance-Leahy Scale: Good     Standing balance support: Bilateral upper extremity supported Standing balance-Leahy Scale: Poor                      Cognition Arousal/Alertness: Awake/alert Behavior During Therapy: WFL for tasks assessed/performed Overall Cognitive Status: Within Functional Limits for tasks assessed                      Exercises General Exercises - Lower Extremity Ankle Circles/Pumps: AROM;Both;10 reps;Seated Quad Sets: AROM;10 reps;Supine;Both Gluteal Sets: AROM;Both;10 reps;Supine Hip ABduction/ADduction: AAROM;Left;10 reps;Supine Straight Leg Raises: AAROM;Left;10 reps;Supine    General Comments General comments (skin integrity, edema, etc.): dry dressing, external fixator      Pertinent Vitals/Pain Pain Assessment: 0-10 Pain Score: 5  Pain Location: left lower leg Pain Descriptors / Indicators: Aching;Sore Pain Intervention(s): Limited activity within patient's tolerance;Monitored during session;Premedicated before session;Repositioned;Ice applied    Home Living                      Prior Function            PT Goals (current goals can now be found in the care plan section) Acute Rehab PT Goals Patient Stated Goal: incr independence PT Goal Formulation: With patient/family Time For Goal Achievement: 07/01/15 Potential to Achieve Goals: Good Progress towards PT goals: Progressing toward goals    Frequency  Min 5X/week    PT Plan Current plan remains appropriate    Co-evaluation  End of Session Equipment Utilized During Treatment: Gait belt Activity Tolerance: Patient tolerated treatment well Patient left: in bed;with call bell/phone within reach;with family/visitor present;with SCD's reapplied     Time: [id number] PT Time Calculation (min) (ACUTE ONLY): 52 min  Charges:  $Therapeutic Exercise: 8-22 mins $Therapeutic Activity: 8-22 mins $Self Care/Home  Management: 03/18/23                    G CodesMalka So, PT [phone]  Tywaun Hiltner 06/18/2015, 6:24 PM

## 2015-06-18 NOTE — Progress Notes (Signed)
Occupational Therapy Treatment/splint fabrication Patient Details Name: Nancy Beard MRN: [id number] DOB: Sep 10, 1955 Today's Date: 06/18/2015    History of present illness 59 y.o. female with fall resulting in fracture of left bicondylar tibial plateau, s/p Application of spanning external fixator, left leg. Patient has a history of hypothyroidism, celiac sprue, breast cancer, and RLE DVT.   OT comments  Pt seen for fabrication of L footplate to position foot in  Neutral dorsiflexion. Educated pt/husband on purpose of splint and correct positioning. Splint fitting well without any pressure on lateral aspect of foot. Will return for splint check.   Follow Up Recommendations  No OT follow up;Supervision/Assistance - 24 hour    Equipment Recommendations  3 in 1 bedside comode    Recommendations for Other Services      Precautions / Restrictions Precautions Precautions: Fall Required Braces or Orthoses:  (external fixator) Restrictions Weight Bearing Restrictions: Yes LLE Weight Bearing: Non weight bearing                            Extremity/Trunk Assessment  Able to achieve neutral dorsiflexion. Pt able to assist with dorsiflexing foot.                 General Comments  Footplate fabricated for L foot. Good positioning achieved. Padding added to plate to attempt to reduce slippage. Straps attached to external fixator. Pt tolerated well and stated it felt much better to have foot supported. Instructed pt to remove footplate every 2 hours to actively move foot. She can donn "peds pump" for an hour (while splint is off) if desired before replacing footplate.    Pertinent Vitals/ Pain       Pain Assessment: 0-10 Pain Score: 5  Pain Location: LLE Pain Descriptors / Indicators: Aching Pain Intervention(s): Limited activity within patient's tolerance;Repositioned;Ice applied  Home Living Family/patient expects to be discharged to:: Private residence Living  Arrangements: Spouse/significant other Available Help at Discharge: Family;Available 24 hours/day (this week) Type of Home: House Home Access: Stairs to enter [employer] of Steps: 2+1 Entrance Stairs-Rails: None Home Layout: Two level;Able to live on main level with bedroom/bathroom     Bathroom Shower/Tub: Walk-in shower;Door (3 inch curb)   Armed forces training and education officer: Yes How Accessible: Accessible via walker Home Equipment: None   Additional Comments: friend has RW she can use      Prior Functioning/Environment Level of Independence: Independent        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     Progress Toward Goals  OT Goals(current goals can now be found in the care plan section)  Progress towards OT goals: Progressing toward goals  Acute Rehab OT Goals Patient Stated Goal: No pain OT Goal Formulation: With patient Time For Goal Achievement: 07/02/15 Potential to Achieve Goals: Good ADL Goals Pt Will Perform Lower Body Bathing: with set-up;with supervision;with adaptive equipment;sit to/from stand;with caregiver independent in assisting Pt Will Perform Lower Body Dressing: with set-up;with supervision;with adaptive equipment;sit to/from stand;with caregiver independent in assisting Pt Will Transfer to Toilet: with min guard assist;bedside commode (with caregiver assisting) Pt Will Perform Toileting - Clothing Manipulation and hygiene: with set-up;sitting/lateral leans Pt Will Perform Tub/Shower Transfer: with min guard assist;ambulating;3 in 1;with caregiver independent in assisting;rolling walker;Shower transfer Pt/caregiver will Perform Home Exercise Program: Increased strength;Both right and left upper extremity;Independently Additional ADL Goal #1: Pt will tolerate L footplate splint without complications to improve proper positioning of  L foot into neutral dorsiflexion  Plan Discharge plan remains appropriate     Co-evaluation                 End of Session Equipment Utilized During Treatment: Rolling walker   Activity Tolerance Patient tolerated treatment well   Patient Left in bed;with call bell/phone within reach;with family/visitor present   Nurse Communication Mobility status;Other (comment) (instructions regarding footplate)        Time: [id number]  OT Time Calculation (min): 32 min  Charges: OT General Charges $OT Visit: 1 Procedure  $Orthotics Fit/Training: 23-37 mins $ OT Supplies: 1 Supply (75.00)  Nancy Beard,Nancy Beard 06/18/2015, 5:27 PM   Nancy Beard, OTR/L  [phone] 06/18/2015

## 2015-06-18 NOTE — Progress Notes (Signed)
Occupational Therapy Evaluation Patient Details Name: Nancy Beard MRN: [id number] DOB: September 20, 1955 Today's Date: 06/18/2015    History of Present Illness 59 y.o. female with fall resulting in fracture of left bicondylar tibial plateau, s/p Application of spanning external fixator, left leg. Patient has a history of hypothyroidism, celiac sprue, breast cancer, and RLE DVT.   Clinical Impression   PTA, pt independent with ADL and mobility and worked. Pt currently requires +2 min A with mobility and Max A with LB ADL. Pt will benefit from skilled OT acute services to facilitate safe D/C home. Pt to have surgery to internally fixate LLE. Will further assess ADL at that time. Discussed with pt/caregiver. Pt making excellent progress. Will return to fabricate L footplate.     Follow Up Recommendations  No OT follow up;Supervision/Assistance - 24 hour (initially)   Equipment Recommendations  3 in 1 bedside comode    Recommendations for Other Services       Precautions / Restrictions Precautions Precautions: Fall Required Braces or Orthoses:  (external fixator) Restrictions Weight Bearing Restrictions: Yes LLE Weight Bearing: Non weight bearing      Mobility Bed Mobility Overal bed mobility: Needs Assistance Bed Mobility: Supine to Sit     Supine to sit: HOB elevated;Min assist     General bed mobility comments: CNA mobilized LLE through assisting by lifting fixator. Moving slowly. good recall from earlier session with PT.  Transfers Overall transfer level: Needs assistance Equipment used: Rolling walker (2 wheeled)   Sit to Stand: Min assist;+2 physical assistance (for assistance with LLE) Stand pivot transfers: Min assist       General transfer comment: Husband able to give verbal instructioins to assist with mobility that he recalled from previous mobility session with PT    Balance     Sitting balance-Leahy Scale: Good       Standing balance-Leahy Scale:  Poor                              ADL Overall ADL's : Needs assistance/impaired     Grooming: Set up   Upper Body Bathing: Set up;Bed level   Lower Body Bathing: Maximal assistance;Sit to/from stand   Upper Body Dressing : Set up   Lower Body Dressing: Maximal assistance;Sit to/from stand   Toilet Transfer: +2 for physical assistance;RW;BSC;Stand-pivot;Minimal assistance;+2 for safety/equipment (simulated)   Toileting- Clothing Manipulation and Hygiene: Maximal assistance Toileting - Clothing Manipulation Details (indicate cue type and reason): foley     Functional mobility during ADLs: +2 for physical assistance;Rolling walker;Minimal assistance (+2 to manage LLE) General ADL Comments: Pt limited at this time due to pain with LLE movement. discussed home set up and likely progression of self care. will further assess ADL after fixation. Pt will need 3 in 1. discussed with pt/husband. also assessed pt for footplate.                      Pertinent Vitals/Pain Pain Assessment: 0-10 Pain Score: 5  Pain Location: LLE Pain Descriptors / Indicators: Aching Pain Intervention(s): Limited activity within patient's tolerance;Repositioned;Ice applied     Hand Dominance Right   Extremity/Trunk Assessment Upper Extremity Assessment Upper Extremity Assessment: Overall WFL for tasks assessed   Lower Extremity Assessment Lower Extremity Assessment: LLE deficits/detail LLE Deficits / Details: able to move toes and ankle  LLE: Unable to fully assess due to immobilization;Unable to fully assess due to pain  Cervical / Trunk Assessment Cervical / Trunk Assessment: Normal   Communication Communication Communication: No difficulties   Cognition Arousal/Alertness: Awake/alert Behavior During Therapy: WFL for tasks assessed/performed Overall Cognitive Status: Within Functional Limits for tasks assessed                     General Comments        Exercises       Shoulder Instructions      Home Living Family/patient expects to be discharged to:: Private residence Living Arrangements: Spouse/significant other Available Help at Discharge: Family;Available 24 hours/day (this week) Type of Home: House Home Access: Stairs to enter [employer] of Steps: 2+1 Entrance Stairs-Rails: None Home Layout: Two level;Able to live on main level with bedroom/bathroom     Bathroom Shower/Tub: Walk-in shower;Door (3 inch curb)   Armed forces training and education officer: Yes How Accessible: Accessible via walker Home Equipment: None   Additional Comments: friend has RW she can use      Prior Functioning/Environment Level of Independence: Independent        Comments: works Production manager    OT Diagnosis: Generalized weakness;Acute pain   OT Problem List: Decreased strength;Decreased range of motion;Decreased activity tolerance;Impaired balance (sitting and/or standing);Decreased knowledge of use of DME or AE;Decreased knowledge of precautions;Pain   OT Treatment/Interventions: Self-care/ADL training;Therapeutic exercise;DME and/or AE instruction;Splinting;Therapeutic activities;Patient/family education;Balance training    OT Goals(Current goals can be found in the care plan section) Acute Rehab OT Goals Patient Stated Goal: No pain OT Goal Formulation: With patient Time For Goal Achievement: 07/02/15 Potential to Achieve Goals: Good ADL Goals Pt Will Perform Lower Body Bathing: with set-up;with supervision;with adaptive equipment;sit to/from stand;with caregiver independent in assisting Pt Will Perform Lower Body Dressing: with set-up;with supervision;with adaptive equipment;sit to/from stand;with caregiver independent in assisting Pt Will Transfer to Toilet: with min guard assist;bedside commode (with caregiver assisting) Pt Will Perform Toileting - Clothing Manipulation and hygiene: with  set-up;sitting/lateral leans Pt Will Perform Tub/Shower Transfer: with min guard assist;ambulating;3 in 1;with caregiver independent in assisting;rolling walker;Shower transfer Pt/caregiver will Perform Home Exercise Program: Increased strength;Both right and left upper extremity;Independently Additional ADL Goal #1: Pt will tolerate L footplate splint without complications to improve proper positioning of L foot into neutral dorsiflexion  OT Frequency: Min 3X/week   Barriers to D/C:            Co-evaluation              End of Session Equipment Utilized During Treatment: Surveyor, mining Communication: Mobility status;Other (comment) (instructions regarding footplate)  Activity Tolerance: Patient tolerated treatment well Patient left: in bed;with call bell/phone within reach;with family/visitor present   Time: 1330-1355 OT Time Calculation (min): 25 min Charges:  OT General Charges $OT Visit: 1 Procedure OT Evaluation $Initial OT Evaluation Tier I: 1 Procedure OT Treatments $Self Care/Home Management : 8-22 mins  G-Codes:    Katrena Stehlin,HILLARY 06-19-2015, 5:28 PM   [facility], OTR/L  [phone] 19-Jun-2015

## 2015-06-19 MED ORDER — LIOTHYRONINE SODIUM 25 MCG PO TABS
12.5000 ug | ORAL_TABLET | Freq: Every day | ORAL | Status: DC
  Administered 2015-06-19 – 2015-06-24 (×6): 12.5 ug via ORAL
  Filled 2015-06-19 (×10): qty 1

## 2015-06-19 NOTE — Progress Notes (Signed)
Occupational Therapy Treatment Patient Details Name: Nancy Beard MRN: [id number] DOB: [birth year] Today's Date: 06/19/2015    History of present illness 59 y.o. female with fall resulting in fracture of left bicondylar tibial plateau, s/p Application of spanning external fixator, left leg. Patient has a history of hypothyroidism, celiac sprue, breast cancer, and RLE DVT.   OT comments  Continue to make adjustments to foot plate splint and wearing schedule. Pt tolerating well now.  Follow Up Recommendations  No OT follow up;Supervision/Assistance - 24 hour    Equipment Recommendations  3 in 1 bedside comode       Precautions / Restrictions Precautions Precautions: Fall Required Braces or Orthoses: Other Brace/Splint (external fixator) Other Brace/Splint: foot plate splint Restrictions Weight Bearing Restrictions: Yes LLE Weight Bearing: Non weight bearing                              Cognition   Behavior During Therapy: WFL for tasks assessed/performed Overall Cognitive Status: Within Functional Limits for tasks assessed                         Exercises Other Exercises Other Exercises: Pt seen for 3rd time today to check foot plate splint. Foot compression pump will not work with foot plate splint (husband in room and he said yes, they had found that out yesterday--wife remembered this after he said it). Since Ortho PA told pt she wanted pt to have foot pump on then it is now decided that pt will wear foot plate 2 hours, then foot pump 2 hours. Foot pump was adjusted to 100 pressure over 3 seconds due to pt stating 130 over 3 seconds was too much.  Nursing [facility]) aware that this has to be manually set each time after machine is turned off. Pt, husband, Nurse Roland Rack) and NT(Fatima) aware of the on/off schedule of foot pump and foot plate.           Pertinent Vitals/ Pain       Pain Assessment: No/denies pain         Frequency Min 3X/week      Progress Toward Goals  OT Goals(current goals can now be found in the care plan section)  Progress towards OT goals: Progressing toward goals     Plan Discharge plan remains appropriate          Activity Tolerance Patient tolerated treatment well   Patient Left in bed;with call bell/phone within reach   Nurse Communication  (See notes under Misc Exercises)        Time: 1443-1510 OT Time Calculation (min): 27 min  Charges: OT General Charges $OT Visit: 1 Procedure OT Treatments $Orthotics/Prosthetics Check: 23-37 mins  Almon Register [id number] 06/19/2015, 3:36 PM

## 2015-06-19 NOTE — Progress Notes (Signed)
Occupational Therapy Treatment Patient Details Name: Nancy Beard MRN: [id number] DOB: 09/30/1955 Today's Date: 06/19/2015    History of present illness 59 y.o. female with fall resulting in fracture of left bicondylar tibial plateau, s/p Application of spanning external fixator, left leg. Patient has a history of hypothyroidism, celiac sprue, breast cancer, and RLE DVT.   OT comments  This 59 yo female admitted with above presents to acute OT not tolerating her footplate splint last night, adjustments made and education completed with pt and NT--will check back one more time today to make sure all is going well with splint.  Follow Up Recommendations  No OT follow up;Supervision/Assistance - 24 hour    Equipment Recommendations  3 in 1 bedside comode       Precautions / Restrictions Precautions Precautions: Fall Required Braces or Orthoses: Other Brace/Splint (external fixator) Other Brace/Splint: foot plate splint Restrictions Weight Bearing Restrictions: Yes LLE Weight Bearing: Non weight bearing                              Cognition  WNL                             Exercises Other Exercises Other Exercises: Foot plate splint off when I entered room--per pt it really started bothering her/painful last night and the staff took it off. Pt is aware of why she needs to wear foot plate splint. Had pt perform 10 reps of dorsiflexion/plantar flexion of left foot, then had her dorsiflex foot while I put foot plate splint back on at a current tolerable stretch. Had NT [facility]) come in room and showed her how to adjust splint as needed based on how pt says she is doing. I also spoke with NT that best scenerio is to remove splint every 2 hours and let pt do full dorsi/plantar flexion, then reapply with pt holding her foot in dorsiflexion). NT aware that we need to not totally remove it for a long time frame, but can loosen and retighten as needed            Pertinent Vitals/ Pain        7/10 LLE, sore/painful--especially with movement; RN made aware pt requesting pain meds          Frequency Min 3X/week     Progress Toward Goals  OT Goals(current goals can now be found in the care plan section)  Progress towards OT goals: Progressing toward goals     Plan Discharge plan remains appropriate       End of Session     Activity Tolerance Patient tolerated treatment well   Patient Left in bed;with call bell/phone within reach, NT in room   Nurse Communication  (NT: see exercise section. Also spoke to RN Roland Rack) to speak to NT [facility]) if she has questions about splint)        Time: [phone] OT Time Calculation (min): 28 min  Charges: OT General Charges $OT Visit: 1 Procedure OT Treatments $Orthotics/Prosthetics Check: 23-37 mins  Almon Register [id number] 06/19/2015, 9:05 AM

## 2015-06-19 NOTE — Progress Notes (Signed)
Subjective: 4 Days Post-Op Procedure(s) (LRB): EXTERNAL FIXATION LEG (Left) Patient reports pain as 3 on 0-10 scale.    Objective: Vital signs in last 24 hours: Temp:  [98.1 F (36.7 C)-98.8 F (37.1 C)] 98.1 F (36.7 C) (12/04 0707) Pulse Rate:  [102-108] 102 (12/04 0707) Resp:  [16-18] 16 (12/04 0707) BP: (106-125)/(59-67) 120/67 mmHg (12/04 0707) SpO2:  [95 %-97 %] 97 % (12/04 0707)  Intake/Output from previous day: 12/03 0701 - 12/04 0700 In: 1080 [P.O.:1080] Out: 1300 [Urine:1300] Intake/Output this shift: Total I/O In: -  Out: 2000 [Urine:2000]   Recent Labs  06/17/15 0715  HGB 11.4*    Recent Labs  06/17/15 0715  WBC 12.2*  RBC 3.94  HCT 35.3*  PLT 218   No results for input(s): NA, K, CL, CO2, BUN, CREATININE, GLUCOSE, CALCIUM in the last 72 hours. No results for input(s): LABPT, INR in the last 72 hours.  ABD soft Neurovascular intact Sensation intact distally Intact pulses distally Dorsiflexion/Plantar flexion intact Incision: dressing C/D/I  Assessment/Plan: 4 Days Post-Op Procedure(s) (LRB): EXTERNAL FIXATION LEG (Left)  Active Problems:   Fracture tibia/fibula   Fracture, tibial plateau  Up with therapy  Surgery planned for Tuesday.  Patient is medically stable today and comfortable  Taiwan Talcott J 06/19/2015, 10:15 AM

## 2015-06-19 NOTE — Progress Notes (Signed)
PT Cancellation Note  Patient Details Name: Nancy Beard MRN: [id number] DOB: 1955/09/08   Cancelled Treatment:    Reason Eval/Treat Not Completed: Patient declined, no reason specified.  Patient fatigued.  Also waiting for company.  Will return at a later date.   Despina Pole 06/19/2015, 3:19 PM Carita Pian. Sanjuana Kava, [place] Pager [phone]

## 2015-06-19 NOTE — Progress Notes (Signed)
Occupational Therapy Treatment Patient Details Name: Nancy Beard MRN: [id number] DOB: [birth year] Today's Date: 06/19/2015    History of present illness 59 y.o. female with fall resulting in fracture of left bicondylar tibial plateau, s/p Application of spanning external fixator, left leg. Patient has a history of hypothyroidism, celiac sprue, breast cancer, and RLE DVT.   OT comments  This 59 yo female admitted with above presents to acute OT with showing increased tolerance of left footplate splint. Will continue to follow.  Follow Up Recommendations  No OT follow up;Supervision/Assistance - 24 hour    Equipment Recommendations  3 in 1 bedside comode       Precautions / Restrictions Precautions Precautions: Fall Required Braces or Orthoses: Other Brace/Splint (external fixator) Other Brace/Splint: foot plate splint Restrictions Weight Bearing Restrictions: Yes LLE Weight Bearing: Non weight bearing                              Cognition   Behavior During Therapy: WFL for tasks assessed/performed Overall Cognitive Status: Within Functional Limits for tasks assessed                         Exercises Other Exercises Other Exercises: Pt reports that PA came in and wanted foot based compression pump on her left foot in addition to foot plate splint. This changes the way the footplate splint needs to lay on her foot--now with the top of the footplate needing to rest right at the base of the toes (it does not touch the toes due to the extra layer of the foot pump between pt and the foot plate.). I called NT [place] ) in to show her how it needed to look now on pt's foot. Pt also did 10 reps of foot flexion/extension holding each direction for 10 seconds. Pt with help of staff [facility] aware--pt needs someone to remove footplate splint for her) to do foot exercises every couple of hours           Pertinent Vitals/ Pain       Pain Assessment: No/denies pain         Frequency Min 3X/week     Progress Toward Goals  OT Goals(current goals can now be found in the care plan section)  Progress towards OT goals: Progressing toward goals     Plan Discharge plan remains appropriate          Activity Tolerance Patient tolerated treatment well   Patient Left in bed;with call bell/phone within reach   Nurse Communication  (NT [facility]) made aware of changes concerning footplate splint ( see exercise section))        Time: [id number] OT Time Calculation (min): 24 min  Charges: OT General Charges $OT Visit: 1 Procedure OT Treatments $Orthotics/Prosthetics Check: 23-37 mins  Almon Register [id number] 06/19/2015, 1:00 PM

## 2015-06-20 ENCOUNTER — Encounter (HOSPITAL_COMMUNITY): Payer: Self-pay | Admitting: Orthopedic Surgery

## 2015-06-20 DIAGNOSIS — E559 Vitamin D deficiency, unspecified: Secondary | ICD-10-CM

## 2015-06-20 HISTORY — DX: Vitamin D deficiency, unspecified: E55.9

## 2015-06-20 MED ORDER — CEFAZOLIN SODIUM-DEXTROSE 2-3 GM-% IV SOLR
2.0000 g | INTRAVENOUS | Status: AC
  Administered 2015-06-21: 2 g via INTRAVENOUS

## 2015-06-20 NOTE — Progress Notes (Signed)
Occupational Therapy Treatment Patient Details Name: Nancy Beard MRN: [id number] DOB: [birth year] Today's Date: 06/20/2015    History of present illness 59 y.o. female with fall resulting in fracture of left bicondylar tibial plateau, s/p Application of spanning external fixator, left leg. Patient has a history of hypothyroidism, celiac sprue, breast cancer, and RLE DVT.   OT comments  Pt seen for splint check. Splint repositioned on foot and fitting well. Continue to recommend pt wear footplate and can remove as needed to move foot/wiggle toes for comfort.  Will follow after surgery to continue to assess needs for safe D/C home.   Follow Up Recommendations  No OT follow up;Supervision/Assistance - 24 hour    Equipment Recommendations  3 in 1 bedside comode    Recommendations for Other Services      Precautions / Restrictions Precautions Precautions: Fall Precaution Comments: Ex Fix LLE Required Braces or Orthoses: Other Brace/Splint Other Brace/Splint: foot plate splint Restrictions Weight Bearing Restrictions: Yes LLE Weight Bearing: Non weight bearing       Mobility     General transfer comment: Nsg presetn. Educated nsg on pt ability to either stand pivot to [facility] or use ant/post trasnfer to get to [facility] depending on comfort level.                          Cognition   Behavior During Therapy: WFL for tasks assessed/performed Overall Cognitive Status: Within Functional Limits for tasks assessed                       Extremity/Trunk Assessment               Exercises General Exercises - Lower Extremity Quad Sets: AROM;Both;10 reps Gluteal Sets: AROM;Both;10 reps Hip ABduction/ADduction: AROM;Both;10 reps Straight Leg Raises: AROM;Both;10 reps   Shoulder Instructions       General Comments  Pt seen for splint check. Pt not wearing splint on entry to room. splint repositioned. Pt reports no problems with splint. Information regarding  splint left on whiteboard in room    Pertinent Vitals/ Pain       Pain Assessment: 0-10 Pain Score: 3  Pain Location: LLE during movement Pain Descriptors / Indicators: Aching Pain Intervention(s): Limited activity within patient's tolerance  Home Living                                          Prior Functioning/Environment              Frequency Min 3X/week     Progress Toward Goals  Maurie Boettcher, OTR/L  [id number] 12/5/2016OT Goals(current goals can now be found in the care plan section)  Progress towards OT goals: Progressing toward goals  Acute Rehab OT Goals Patient Stated Goal: incr independence OT Goal Formulation: With patient Time For Goal Achievement: 07/02/15 Potential to Achieve Goals: Good ADL Goals Pt Will Perform Lower Body Bathing: with set-up;with supervision;with adaptive equipment;sit to/from stand;with caregiver independent in assisting Pt Will Perform Lower Body Dressing: with set-up;with supervision;with adaptive equipment;sit to/from stand;with caregiver independent in assisting Pt Will Transfer to Toilet: with min guard assist;bedside commode Pt Will Perform Toileting - Clothing Manipulation and hygiene: with set-up;sitting/lateral leans Pt Will Perform Tub/Shower Transfer: with min guard assist;ambulating;3 in 1;with caregiver independent in assisting;rolling walker;Shower transfer Pt/caregiver will Perform Home Exercise Program: Increased strength;Both  right and left upper extremity;Independently Additional ADL Goal #1: Pt will tolerate L footplate splint without complications to improve proper positioning of L foot into neutral dorsiflexion  Plan Discharge plan remains appropriate    Co-evaluation                 End of Session     Activity Tolerance Patient tolerated treatment well   Patient Left in bed;with call bell/phone within reach   Nurse Communication Mobility status;Other (comment)        [id number] OT Time Calculation (min): 12 min  Charges: OT General Charges $OT Visit: 1 Procedure OT Treatments $Orthotics/Prosthetics Check: 8-22 mins  Caedin Mogan,HILLARY 06/20/2015, 10:37 AM

## 2015-06-20 NOTE — Progress Notes (Signed)
Orthopaedic Trauma Service Progress Note  Subjective  Doing well Pain improved Sitting in chair BM yesterday Voiding w/o difficulty Foot plate fitting well    Review of Systems  Constitutional: Negative for fever and chills.  Respiratory: Negative for shortness of breath and wheezing.   Cardiovascular: Negative for chest pain and palpitations.  Gastrointestinal: Negative for nausea, vomiting and abdominal pain.  Neurological: Negative for tingling, sensory change and headaches.     Objective   BP 122/64 mmHg  Pulse 105  Temp(Src) 98.9 F (37.2 C) (Oral)  Resp 16  Ht 5\' 5"  (1.651 m)  Wt 74.39 kg (164 lb)  BMI 27.29 kg/m2  SpO2 98%  LMP 09/11/2012  Intake/Output      12/04 0701 - 12/05 0700 12/05 0701 - 12/06 0700   P.O. 480    Total Intake(mL/kg) 480 (6.5)    Urine (mL/kg/hr) 4900 (2.7)    Stool     Total Output 4900     Net -4420            Labs  No new labs  Exam  Gen: sitting in bedside chair, NAD, comfortable  Lungs: clear anterior fields Cardiac: RRR, s1 and s2 Abd: + BS,NTND Ext:       Left Lower Extremity  Ex fix stable  pinsites look great  Swelling decreased  Foot plate fitting well  Ext warm  + DP pulse  No DCT  Compartments soft and NT  DPN, SPN, TN sensation intact  EHL, FHL, AT, PT, peroneals, gastroc motor intact    Assessment and Plan   POD/HD#: 61   59 y/o female s/p low energy fall with complex bicondylar Left tibial plateau fracture  1. Fall at work  2. Comminuted bicondylar L tibial plateaus fracture             NWB               Ok to get out of bed with assist             PT/OT evals             Ice               Elevate leg above heart             Ok to move toes and ankle               OR tomorrow for ORIF   3. Pain management:             continue with current regimen   4. ABL anemia/Hemodynamics             Stable             Cbc in am   5. Medical issues               Hypothyroid           Home meds                         TSH ok   6. DVT/PE prophylaxis:             Lovenox   7.ID:               periop abx  8.Metabolic Bone Disease:             Vitamin D insufficiency and mild hyperparathyroidism  Add vitamin D supplementation                         Would like to obtain 1,25 vitamin d level but lab no longer offered at this institution- can do as outpt    9.Activity:             Up with assistance             NWB Left leg  10. FEN/Foley/Lines:             Diet as tolerated  NPO after MN               11.Ex-fix/Splint care:             Ok to manipulate leg by fixator               12. Dispo:             OR tomorrow for ORIF      Jari Pigg, PA-C [facility] [phone] (O) 06/20/2015 10:15 AM

## 2015-06-20 NOTE — Progress Notes (Signed)
Physical Therapy Treatment Patient Details Name: Nancy Beard MRN: [id number] DOB: 23-Oct-1955 Today's Date: 06/20/2015    History of Present Illness 59 y.o. female with fall resulting in fracture of left bicondylar tibial plateau, s/p Application of spanning external fixator, left leg. Patient has a history of hypothyroidism, celiac sprue, breast cancer, and RLE DVT.    PT Comments    Progressing toward goals as evident by ambulation of 18 ft in room and overall mobility level of min A/min guard. Pt compliant with NWB status L LE throughout session. Continue to progress as tolerated.   Follow Up Recommendations  Home health PT;Supervision for mobility/OOB     Equipment Recommendations  Rolling walker with 5" wheels;3in1 (PT);Wheelchair (measurements PT);Wheelchair cushion (measurements PT)    Recommendations for Other Services       Precautions / Restrictions Precautions Precautions: Fall Required Braces or Orthoses: Other Brace/Splint (external fixator) Other Brace/Splint: foot plate splint Restrictions Weight Bearing Restrictions: Yes LLE Weight Bearing: Non weight bearing    Mobility  Bed Mobility Overal bed mobility: Needs Assistance Bed Mobility: Sit to Supine     Supine to sit: HOB elevated;Min assist     General bed mobility comments: assistance bringing LLE to EOB and cues for technique and extra time  Transfers Overall transfer level: Needs assistance Equipment used: Rolling walker (2 wheeled) Transfers: Sit to/from Stand Sit to Stand: Min guard;Min assist         General transfer comment: min A for powering up to stand and min guard when descending for safety; cues for hand placement and technique; compliant with NWB status LLE  Ambulation/Gait Ambulation/Gait assistance: Min guard   Assistive device: Rolling walker (2 wheeled) Gait Pattern/deviations: Step-to pattern     General Gait Details: fatigued quickly; standing rest break needed;  compliant with NWB status L LE without cues   Stairs            Wheelchair Mobility    Modified Rankin (Stroke Patients Only)       Balance Overall balance assessment: Needs assistance Sitting-balance support: Bilateral upper extremity supported Sitting balance-Leahy Scale: Good     Standing balance support: Bilateral upper extremity supported Standing balance-Leahy Scale: Poor                      Cognition Arousal/Alertness: Awake/alert Behavior During Therapy: WFL for tasks assessed/performed Overall Cognitive Status: Within Functional Limits for tasks assessed                      Exercises General Exercises - Lower Extremity Quad Sets: AROM;Both;10 reps Gluteal Sets: AROM;Both;10 reps Hip ABduction/ADduction: AROM;Both;10 reps Straight Leg Raises: AROM;Both;10 reps    General Comments        Pertinent Vitals/Pain Pain Assessment: No/denies pain Pain Intervention(s): Ice applied;Monitored during session    Home Living                      Prior Function            PT Goals (current goals can now be found in the care plan section) Acute Rehab PT Goals PT Goal Formulation: With patient/family Time For Goal Achievement: 07/01/15 Potential to Achieve Goals: Good Progress towards PT goals: Progressing toward goals    Frequency  Min 5X/week    PT Plan Current plan remains appropriate    Co-evaluation             End of Session Equipment  Utilized During Treatment: Gait belt Activity Tolerance: Patient tolerated treatment well Patient left: in chair;with call bell/phone within reach;with nursing/sitter in room     Time: 0936-1008 PT Time Calculation (min) (ACUTE ONLY): 32 min  Charges:  $Gait Training: 8-22 mins $Therapeutic Exercise: 8-22 mins                    G CodesDarliss Cheney, PTA [phone] 06/20/2015, 10:23 AM

## 2015-06-20 NOTE — Care Management Note (Addendum)
Case Management Note  Patient Details  Name: Nancy Beard MRN: [id number] Date of Birth: 05-03-1956  Subjective/Objective:    60 yr old female s/p fall at work, admitted with left bicondylar tibial plateau fracture. Patient underwent ORIF on 06/17/15.                 Action/Plan: Case manager spoke with patient concerning [place] and DME needs. She is under worker's comp. Her employer is [employer]. Patient received name of her adjuster today-Todd Steverson with [place], [phone]. Case manager left voice message . Will continue to monitor.   Expected Discharge Date:                  Expected Discharge Plan:   Home with Home Health  In-House Referral:  NA  Discharge planning Services  CM Consult  Post Acute Care Choice:  Home Health, Durable Medical Equipment Choice offered to:   (Patient is under worker's comp. CM waiting for Adjuster  to assign)  DME Arranged:  3-N-1, Walker rolling, Wheelchair manual DME Agency:   (waiting for [employer] comp to assign agency)  HH Arranged:  PT [place] Agency:   (waiting for worker's comp adjuster to assign agency)  Status of Service:  In process, will continue to follow  Medicare Important Message Given:    Date Medicare IM Given:    Medicare IM give by:    Date Additional Medicare IM Given:    Additional Medicare Important Message give by:     If discussed at [place] of Stay Meetings, dates discussed:    Additional Comments:  Ninfa Meeker, RN 06/20/2015, 3:06 PM

## 2015-06-21 ENCOUNTER — Inpatient Hospital Stay (HOSPITAL_COMMUNITY): Payer: Worker's Compensation | Admitting: Anesthesiology

## 2015-06-21 ENCOUNTER — Encounter (HOSPITAL_COMMUNITY)
Admission: EM | Disposition: A | Discharge status: Rehabilitation Facility | Payer: Self-pay | Source: Home / Self Care | Attending: Orthopedic Surgery

## 2015-06-21 ENCOUNTER — Inpatient Hospital Stay (HOSPITAL_COMMUNITY): Payer: Worker's Compensation

## 2015-06-21 ENCOUNTER — Encounter (HOSPITAL_COMMUNITY): Payer: Self-pay | Admitting: Anesthesiology

## 2015-06-21 HISTORY — PX: ORIF TIBIA PLATEAU: SHX2132

## 2015-06-21 LAB — URINALYSIS, ROUTINE W REFLEX MICROSCOPIC
Bilirubin Urine: NEGATIVE
Glucose, UA: NEGATIVE mg/dL
Ketones, ur: NEGATIVE mg/dL
Nitrite: POSITIVE — AB
Protein, ur: NEGATIVE mg/dL
Specific Gravity, Urine: 1.016 (ref 1.005–1.030)
pH: 6 (ref 5.0–8.0)

## 2015-06-21 LAB — CBC WITH DIFFERENTIAL/PLATELET
Basophils Absolute: 0 10*3/uL (ref 0.0–0.1)
Basophils Relative: 0 %
Eosinophils Absolute: 0.1 10*3/uL (ref 0.0–0.7)
Eosinophils Relative: 2 %
HCT: 33.7 % — ABNORMAL LOW (ref 36.0–46.0)
Hemoglobin: 11.3 g/dL — ABNORMAL LOW (ref 12.0–15.0)
Lymphocytes Relative: 25 %
Lymphs Abs: 2.3 10*3/uL (ref 0.7–4.0)
MCH: 28.9 pg (ref 26.0–34.0)
MCHC: 33.5 g/dL (ref 30.0–36.0)
MCV: 86.2 fL (ref 78.0–100.0)
Monocytes Absolute: 1.1 10*3/uL — ABNORMAL HIGH (ref 0.1–1.0)
Monocytes Relative: 12 %
Neutro Abs: 5.6 10*3/uL (ref 1.7–7.7)
Neutrophils Relative %: 61 %
Platelets: 368 10*3/uL (ref 150–400)
RBC: 3.91 MIL/uL (ref 3.87–5.11)
RDW: 14.2 % (ref 11.5–15.5)
WBC: 9.1 10*3/uL (ref 4.0–10.5)

## 2015-06-21 LAB — COMPREHENSIVE METABOLIC PANEL
ALT: 184 U/L — ABNORMAL HIGH (ref 14–54)
AST: 192 U/L — ABNORMAL HIGH (ref 15–41)
Albumin: 2.9 g/dL — ABNORMAL LOW (ref 3.5–5.0)
Alkaline Phosphatase: 621 U/L — ABNORMAL HIGH (ref 38–126)
Anion gap: 10 (ref 5–15)
BUN: 9 mg/dL (ref 6–20)
CO2: 26 mmol/L (ref 22–32)
Calcium: 9.3 mg/dL (ref 8.9–10.3)
Chloride: 100 mmol/L — ABNORMAL LOW (ref 101–111)
Creatinine, Ser: 0.54 mg/dL (ref 0.44–1.00)
GFR calc Af Amer: >60 mL/min (ref >60)
GFR calc non Af Amer: >60 mL/min (ref >60)
Glucose, Bld: 117 mg/dL — ABNORMAL HIGH (ref 65–99)
Potassium: 3.8 mmol/L (ref 3.5–5.1)
Sodium: 136 mmol/L (ref 135–145)
Total Bilirubin: 0.9 mg/dL (ref 0.3–1.2)
Total Protein: 7 g/dL (ref 6.5–8.1)

## 2015-06-21 LAB — URINE MICROSCOPIC-ADD ON

## 2015-06-21 LAB — PROTIME-INR
INR: 1.08 (ref 0.00–1.49)
Prothrombin Time: 14.2 seconds (ref 11.6–15.2)

## 2015-06-21 LAB — APTT: aPTT: 26 seconds (ref 24–37)

## 2015-06-21 LAB — TYPE AND SCREEN
ABO/RH(D): O POS
Antibody Screen: NEGATIVE

## 2015-06-21 SURGERY — OPEN REDUCTION INTERNAL FIXATION (ORIF) TIBIAL PLATEAU
Anesthesia: General | Site: Leg Upper | Laterality: Left

## 2015-06-21 MED ORDER — ONDANSETRON HCL 4 MG/2ML IJ SOLN
INTRAMUSCULAR | Status: DC | PRN
  Administered 2015-06-21: 4 mg via INTRAVENOUS

## 2015-06-21 MED ORDER — ONDANSETRON HCL 4 MG PO TABS: 4.0000 mg | ORAL_TABLET | Freq: Four times a day (QID) | ORAL | Status: DC | PRN

## 2015-06-21 MED ORDER — NEOSTIGMINE METHYLSULFATE 10 MG/10ML IV SOLN
INTRAVENOUS | Status: DC | PRN
  Administered 2015-06-21: 4 mg via INTRAVENOUS

## 2015-06-21 MED ORDER — OXYCODONE HCL 5 MG PO TABS
5.0000 mg | ORAL_TABLET | ORAL | Status: DC | PRN
  Administered 2015-06-21 – 2015-06-22 (×3): 15 mg via ORAL
  Administered 2015-06-22: 10 mg via ORAL
  Administered 2015-06-22 (×2): 15 mg via ORAL
  Administered 2015-06-22: 10 mg via ORAL
  Administered 2015-06-23: 15 mg via ORAL
  Administered 2015-06-23 – 2015-06-24 (×3): 10 mg via ORAL
  Filled 2015-06-21 (×2): qty 2
  Filled 2015-06-21 (×2): qty 3
  Filled 2015-06-21 (×2): qty 2
  Filled 2015-06-21 (×3): qty 3
  Filled 2015-06-21: qty 2
  Filled 2015-06-21: qty 3

## 2015-06-21 MED ORDER — PROPOFOL 10 MG/ML IV BOLUS
INTRAVENOUS | Status: DC | PRN
  Administered 2015-06-21: 160 mg via INTRAVENOUS

## 2015-06-21 MED ORDER — METOCLOPRAMIDE HCL 5 MG PO TABS: 5.0000 mg | ORAL_TABLET | Freq: Three times a day (TID) | ORAL | Status: DC | PRN

## 2015-06-21 MED ORDER — SODIUM CHLORIDE 0.9 % IV SOLN
10.0000 mg | INTRAVENOUS | Status: DC | PRN
  Administered 2015-06-21: 10 ug/min via INTRAVENOUS

## 2015-06-21 MED ORDER — ENOXAPARIN SODIUM 40 MG/0.4ML ~~LOC~~ SOLN
40.0000 mg | SUBCUTANEOUS | Status: DC
Start: 2015-06-22 — End: 2015-06-24
  Administered 2015-06-22 – 2015-06-24 (×3): 40 mg via SUBCUTANEOUS
  Filled 2015-06-21 (×3): qty 0.4

## 2015-06-21 MED ORDER — MORPHINE SULFATE (PF) 2 MG/ML IV SOLN
2.0000 mg | INTRAVENOUS | Status: DC | PRN
  Administered 2015-06-21: 3 mg via INTRAVENOUS
  Administered 2015-06-21 (×2): 2 mg via INTRAVENOUS
  Administered 2015-06-21: 1 mg via INTRAVENOUS
  Administered 2015-06-22: 3 mg via INTRAVENOUS
  Administered 2015-06-22 (×3): 2 mg via INTRAVENOUS
  Filled 2015-06-21 (×4): qty 1
  Filled 2015-06-21 (×2): qty 2
  Filled 2015-06-21 (×3): qty 1

## 2015-06-21 MED ORDER — ONDANSETRON HCL 4 MG/2ML IJ SOLN: 4.0000 mg | Freq: Four times a day (QID) | INTRAMUSCULAR | Status: DC | PRN

## 2015-06-21 MED ORDER — ONDANSETRON HCL 4 MG/2ML IJ SOLN
INTRAMUSCULAR | Status: AC
  Filled 2015-06-21: qty 2

## 2015-06-21 MED ORDER — GLYCOPYRROLATE 0.2 MG/ML IJ SOLN
INTRAMUSCULAR | Status: DC | PRN
  Administered 2015-06-21: 0.6 mg via INTRAVENOUS

## 2015-06-21 MED ORDER — DEXAMETHASONE SODIUM PHOSPHATE 4 MG/ML IJ SOLN
INTRAMUSCULAR | Status: DC | PRN
  Administered 2015-06-21: 4 mg via INTRAVENOUS

## 2015-06-21 MED ORDER — PROMETHAZINE HCL 25 MG/ML IJ SOLN: 6.2500 mg | INTRAMUSCULAR | Status: DC | PRN

## 2015-06-21 MED ORDER — DEXAMETHASONE SODIUM PHOSPHATE 4 MG/ML IJ SOLN
INTRAMUSCULAR | Status: AC
  Filled 2015-06-21: qty 1

## 2015-06-21 MED ORDER — ARTIFICIAL TEARS OP OINT
TOPICAL_OINTMENT | OPHTHALMIC | Status: AC
  Filled 2015-06-21: qty 3.5

## 2015-06-21 MED ORDER — METHOCARBAMOL 500 MG PO TABS
1000.0000 mg | ORAL_TABLET | Freq: Four times a day (QID) | ORAL | Status: DC
  Administered 2015-06-21 – 2015-06-24 (×12): 1000 mg via ORAL
  Filled 2015-06-21 (×12): qty 2

## 2015-06-21 MED ORDER — ROCURONIUM BROMIDE 100 MG/10ML IV SOLN
INTRAVENOUS | Status: DC | PRN
  Administered 2015-06-21: 30 mg via INTRAVENOUS
  Administered 2015-06-21: 10 mg via INTRAVENOUS
  Administered 2015-06-21: 50 mg via INTRAVENOUS

## 2015-06-21 MED ORDER — ACETAMINOPHEN 325 MG PO TABS
650.0000 mg | ORAL_TABLET | Freq: Four times a day (QID) | ORAL | Status: DC | PRN
  Administered 2015-06-22: 650 mg via ORAL
  Filled 2015-06-21: qty 2

## 2015-06-21 MED ORDER — POTASSIUM CHLORIDE IN NACL 20-0.9 MEQ/L-% IV SOLN
INTRAVENOUS | Status: DC
  Administered 2015-06-22: 06:00:00 via INTRAVENOUS
  Filled 2015-06-21: qty 1000

## 2015-06-21 MED ORDER — FENTANYL CITRATE (PF) 250 MCG/5ML IJ SOLN
INTRAMUSCULAR | Status: AC
  Filled 2015-06-21: qty 5

## 2015-06-21 MED ORDER — MIDAZOLAM HCL 2 MG/2ML IJ SOLN: 0.5000 mg | Freq: Once | INTRAMUSCULAR | Status: DC | PRN

## 2015-06-21 MED ORDER — ACETAMINOPHEN 650 MG RE SUPP: 650.0000 mg | Freq: Four times a day (QID) | RECTAL | Status: DC | PRN

## 2015-06-21 MED ORDER — PROPOFOL 10 MG/ML IV BOLUS
INTRAVENOUS | Status: AC
  Filled 2015-06-21: qty 20

## 2015-06-21 MED ORDER — LIDOCAINE HCL (CARDIAC) 20 MG/ML IV SOLN
INTRAVENOUS | Status: DC | PRN
  Administered 2015-06-21: 20 mg via INTRAVENOUS

## 2015-06-21 MED ORDER — LACTATED RINGERS IV SOLN
INTRAVENOUS | Status: DC
  Administered 2015-06-21 (×2): via INTRAVENOUS

## 2015-06-21 MED ORDER — METOCLOPRAMIDE HCL 5 MG/ML IJ SOLN: 5.0000 mg | Freq: Three times a day (TID) | INTRAMUSCULAR | Status: DC | PRN

## 2015-06-21 MED ORDER — 0.9 % SODIUM CHLORIDE (POUR BTL) OPTIME
TOPICAL | Status: DC | PRN
  Administered 2015-06-21: 1000 mL

## 2015-06-21 MED ORDER — FENTANYL CITRATE (PF) 100 MCG/2ML IJ SOLN
INTRAMUSCULAR | Status: AC
  Administered 2015-06-21: 50 ug via INTRAVENOUS
  Filled 2015-06-21: qty 2

## 2015-06-21 MED ORDER — CEFAZOLIN SODIUM 1-5 GM-% IV SOLN
1.0000 g | Freq: Four times a day (QID) | INTRAVENOUS | Status: AC
  Administered 2015-06-21 – 2015-06-22 (×3): 1 g via INTRAVENOUS
  Filled 2015-06-21 (×3): qty 50

## 2015-06-21 MED ORDER — FENTANYL CITRATE (PF) 100 MCG/2ML IJ SOLN
25.0000 ug | INTRAMUSCULAR | Status: DC | PRN
  Administered 2015-06-21 (×3): 50 ug via INTRAVENOUS

## 2015-06-21 MED ORDER — ROCURONIUM BROMIDE 50 MG/5ML IV SOLN
INTRAVENOUS | Status: AC
  Filled 2015-06-21: qty 1

## 2015-06-21 MED ORDER — MIDAZOLAM HCL 5 MG/5ML IJ SOLN
INTRAMUSCULAR | Status: DC | PRN
  Administered 2015-06-21: 2 mg via INTRAVENOUS

## 2015-06-21 MED ORDER — FENTANYL CITRATE (PF) 100 MCG/2ML IJ SOLN
INTRAMUSCULAR | Status: AC
  Filled 2015-06-21: qty 2

## 2015-06-21 MED ORDER — MIDAZOLAM HCL 2 MG/2ML IJ SOLN
INTRAMUSCULAR | Status: AC
  Filled 2015-06-21: qty 2

## 2015-06-21 MED ORDER — FENTANYL CITRATE (PF) 250 MCG/5ML IJ SOLN
INTRAMUSCULAR | Status: AC
Start: 2015-06-21 — End: 2015-06-21
  Filled 2015-06-21: qty 5

## 2015-06-21 MED ORDER — FENTANYL CITRATE (PF) 100 MCG/2ML IJ SOLN
INTRAMUSCULAR | Status: DC | PRN
  Administered 2015-06-21: 50 ug via INTRAVENOUS
  Administered 2015-06-21: 25 ug via INTRAVENOUS
  Administered 2015-06-21 (×2): 50 ug via INTRAVENOUS
  Administered 2015-06-21 (×2): 25 ug via INTRAVENOUS
  Administered 2015-06-21: 50 ug via INTRAVENOUS
  Administered 2015-06-21: 250 ug via INTRAVENOUS
  Administered 2015-06-21: 25 ug via INTRAVENOUS

## 2015-06-21 MED ORDER — GLYCOPYRROLATE 0.2 MG/ML IJ SOLN
INTRAMUSCULAR | Status: AC
  Filled 2015-06-21: qty 3

## 2015-06-21 MED ORDER — MEPERIDINE HCL 25 MG/ML IJ SOLN: 6.2500 mg | INTRAMUSCULAR | Status: DC | PRN

## 2015-06-21 MED ORDER — METHOCARBAMOL 500 MG PO TABS
ORAL_TABLET | ORAL | Status: AC
  Administered 2015-06-21: 1000 mg via ORAL
  Filled 2015-06-21: qty 2

## 2015-06-21 SURGICAL SUPPLY — 91 items
BANDAGE ELASTIC 4 VELCRO ST LF (GAUZE/BANDAGES/DRESSINGS) ×2 IMPLANT
BANDAGE ELASTIC 6 VELCRO ST LF (GAUZE/BANDAGES/DRESSINGS) ×2 IMPLANT
BANDAGE ESMARK 6X9 LF (GAUZE/BANDAGES/DRESSINGS) ×1 IMPLANT
BIT DRILL 2.5X2.75 QC CALB (BIT) ×1 IMPLANT
BLADE SURG 10 STRL SS (BLADE) ×1 IMPLANT
BLADE SURG 15 STRL LF DISP TIS (BLADE) ×1 IMPLANT
BLADE SURG 15 STRL SS (BLADE)
BLADE SURG ROTATE 9660 (MISCELLANEOUS) IMPLANT
BNDG CMPR 9X6 STRL LF SNTH (GAUZE/BANDAGES/DRESSINGS)
BNDG COHESIVE 4X5 TAN STRL (GAUZE/BANDAGES/DRESSINGS) ×1 IMPLANT
BNDG ESMARK 6X9 LF (GAUZE/BANDAGES/DRESSINGS)
BNDG GAUZE ELAST 4 BULKY (GAUZE/BANDAGES/DRESSINGS) ×3 IMPLANT
BONE CANC CHIPS 40CC CAN1/2 (Bone Implant) ×2 IMPLANT
BRUSH SCRUB DISP (MISCELLANEOUS) ×6 IMPLANT
CANISTER SUCT 3000ML PPV (MISCELLANEOUS) ×1 IMPLANT
CHIPS CANC BONE 40CC CAN1/2 (Bone Implant) ×1 IMPLANT
COVER MAYO STAND STRL (DRAPES) ×1 IMPLANT
COVER SURGICAL LIGHT HANDLE (MISCELLANEOUS) ×2 IMPLANT
CUFF TOURNIQUET SINGLE 34IN LL (TOURNIQUET CUFF) IMPLANT
DRAPE C-ARM 42X72 X-RAY (DRAPES) ×2 IMPLANT
DRAPE C-ARMOR (DRAPES) ×2 IMPLANT
DRAPE EXTREMITY T 121X128X90 (DRAPE) IMPLANT
DRAPE INCISE IOBAN 66X45 STRL (DRAPES) ×2 IMPLANT
DRAPE ORTHO SPLIT 77X108 STRL (DRAPES)
DRAPE SURG ORHT 6 SPLT 77X108 (DRAPES) IMPLANT
DRAPE U-SHAPE 47X51 STRL (DRAPES) ×2 IMPLANT
DRILL BIT 2.5X100 214235007 (MISCELLANEOUS) ×1 IMPLANT
DRSG ADAPTIC 3X8 NADH LF (GAUZE/BANDAGES/DRESSINGS) ×2 IMPLANT
DRSG PAD ABDOMINAL 8X10 ST (GAUZE/BANDAGES/DRESSINGS) ×7 IMPLANT
ELECT CAUTERY BLADE 6.4 (BLADE) ×1 IMPLANT
ELECT REM PT RETURN 9FT ADLT (ELECTROSURGICAL) ×2
ELECTRODE REM PT RTRN 9FT ADLT (ELECTROSURGICAL) ×1 IMPLANT
EVACUATOR 1/8 PVC DRAIN (DRAIN) IMPLANT
EVACUATOR 3/16  PVC DRAIN (DRAIN)
EVACUATOR 3/16 PVC DRAIN (DRAIN) IMPLANT
GAUZE SPONGE 4X4 12PLY STRL (GAUZE/BANDAGES/DRESSINGS) ×2 IMPLANT
GLOVE BIO SURGEON STRL SZ7.5 (GLOVE) ×2 IMPLANT
GLOVE BIO SURGEON STRL SZ8 (GLOVE) ×3 IMPLANT
GLOVE BIOGEL PI IND STRL 7.5 (GLOVE) ×1 IMPLANT
GLOVE BIOGEL PI IND STRL 8 (GLOVE) ×1 IMPLANT
GLOVE BIOGEL PI INDICATOR 7.5 (GLOVE) ×1
GLOVE BIOGEL PI INDICATOR 8 (GLOVE) ×2
GLOVE XGUARD RR 2 7.5 (GLOVE) IMPLANT
GLOVE XGUARD RR2 7.5 (GLOVE) ×2
GOWN STRL REUS W/ TWL LRG LVL3 (GOWN DISPOSABLE) ×2 IMPLANT
GOWN STRL REUS W/ TWL XL LVL3 (GOWN DISPOSABLE) ×1 IMPLANT
GOWN STRL REUS W/TWL LRG LVL3 (GOWN DISPOSABLE) ×4
GOWN STRL REUS W/TWL XL LVL3 (GOWN DISPOSABLE) ×2
GRAFT BNE CHIP CANC 1-8 40 (Bone Implant) IMPLANT
IMMOBILIZER KNEE 22 UNIV (SOFTGOODS) ×3 IMPLANT
K-WIRE ACE 1.6X6 (WIRE) ×14
KIT BASIN OR (CUSTOM PROCEDURE TRAY) ×2 IMPLANT
KIT ROOM TURNOVER OR (KITS) ×2 IMPLANT
KWIRE ACE 1.6X6 (WIRE) IMPLANT
MANIFOLD NEPTUNE WASTE (CANNULA) ×1 IMPLANT
NDL SUT 6 .5 CRC .975X.05 MAYO (NEEDLE) IMPLANT
NEEDLE 22X1 1/2 (OR ONLY) (NEEDLE) ×1 IMPLANT
NEEDLE MAYO TAPER (NEEDLE)
NS IRRIG 1000ML POUR BTL (IV SOLUTION) ×2 IMPLANT
PACK ORTHO EXTREMITY (CUSTOM PROCEDURE TRAY) ×2 IMPLANT
PAD ARMBOARD 7.5X6 YLW CONV (MISCELLANEOUS) ×4 IMPLANT
PAD CAST 4YDX4 CTTN HI CHSV (CAST SUPPLIES) ×1 IMPLANT
PADDING CAST COTTON 4X4 STRL (CAST SUPPLIES) ×2
PADDING CAST COTTON 6X4 STRL (CAST SUPPLIES) ×2 IMPLANT
PLATE LOCK 7H STD LT PROX TIB (Plate) ×1 IMPLANT
SCREW CORT 3.5X32 815037032 (Screw) ×2 IMPLANT
SCREW CORTICAL 3.5MM  34MM (Screw) ×1 IMPLANT
SCREW CORTICAL 3.5MM 34MM (Screw) IMPLANT
SCREW LOCK CORT STAR 3.5X60 (Screw) ×1 IMPLANT
SCREW LOCK CORT STAR 3.5X70 (Screw) ×2 IMPLANT
SCREW LOCK CORT STAR 3.5X75 (Screw) ×1 IMPLANT
SCREW LOCK CORT STAR 3.5X80 (Screw) ×2 IMPLANT
SCREW LP 3.5X75MM (Screw) ×1 IMPLANT
SPONGE LAP 18X18 X RAY DECT (DISPOSABLE) ×2 IMPLANT
STAPLER VISISTAT 35W (STAPLE) ×2 IMPLANT
STOCKINETTE IMPERVIOUS LG (DRAPES) ×1 IMPLANT
SUCTION FRAZIER TIP 10 FR DISP (SUCTIONS) ×2 IMPLANT
SUT ETHILON 3 0 PS 1 (SUTURE) ×2 IMPLANT
SUT PROLENE 0 CT 2 (SUTURE) ×4 IMPLANT
SUT VIC AB 0 CT1 27 (SUTURE) ×2
SUT VIC AB 0 CT1 27XBRD ANBCTR (SUTURE) ×1 IMPLANT
SUT VIC AB 1 CT1 27 (SUTURE) ×4
SUT VIC AB 1 CT1 27XBRD ANBCTR (SUTURE) ×1 IMPLANT
SUT VIC AB 2-0 CT1 27 (SUTURE) ×4
SUT VIC AB 2-0 CT1 TAPERPNT 27 (SUTURE) ×2 IMPLANT
SYR 20ML ECCENTRIC (SYRINGE) IMPLANT
SYR 5ML LUER SLIP (SYRINGE) ×2 IMPLANT
TOWEL OR 17X24 6PK STRL BLUE (TOWEL DISPOSABLE) ×2 IMPLANT
TOWEL OR 17X26 10 PK STRL BLUE (TOWEL DISPOSABLE) ×4 IMPLANT
TUBE CONNECTING 12X1/4 (SUCTIONS) ×2 IMPLANT
YANKAUER SUCT BULB TIP NO VENT (SUCTIONS) ×2 IMPLANT

## 2015-06-21 NOTE — Anesthesia Postprocedure Evaluation (Signed)
Anesthesia Post Note  Patient: Nancy Beard  Procedure(s) Performed: Procedure(s) (LRB): OPEN REDUCTION INTERNAL FIXATION (ORIF) TIBIAL PLATEAU REMOVAL OF EXTERNAL FISTULA (Left)  Patient location during evaluation: PACU Anesthesia Type: General Level of consciousness: awake and alert and patient cooperative Pain management: pain level controlled Vital Signs Assessment: post-procedure vital signs reviewed and stable Respiratory status: spontaneous breathing and respiratory function stable Cardiovascular status: stable Anesthetic complications: no    Last Vitals:  Filed Vitals:   06/21/15 1655 06/21/15 1700  BP: 149/78 150/79  Pulse: 106 110  Temp:    Resp: 28 18    Last Pain:  Filed Vitals:   06/21/15 1702  PainSc: [name]

## 2015-06-21 NOTE — Anesthesia Procedure Notes (Signed)
Procedure Name: Intubation Date/Time: 06/21/2015 12:54 PM Performed by: Rush Farmer E Pre-anesthesia Checklist: Patient identified, Emergency Drugs available, Suction available, Patient being monitored and Timeout performed Patient Re-evaluated:Patient Re-evaluated prior to inductionOxygen Delivery Method: Circle system utilized Preoxygenation: Pre-oxygenation with 100% oxygen Intubation Type: IV induction Ventilation: Mask ventilation without difficulty Laryngoscope Size: Mac and 3 Grade View: Grade II Tube type: Oral Tube size: 7.0 mm Number of attempts: 1 Airway Equipment and Method: Stylet and LTA kit utilized Placement Confirmation: ETT inserted through vocal cords under direct vision,  positive ETCO2 and breath sounds checked- equal and bilateral Secured at: 21 cm Tube secured with: Tape Dental Injury: Teeth and Oropharynx as per pre-operative assessment and Injury to lip  Comments: AOI per Jenne Campus, RN with Dr. Glennon Mac supervising.

## 2015-06-21 NOTE — Anesthesia Preprocedure Evaluation (Addendum)
Anesthesia Evaluation  Patient identified by MRN, date of birth, ID band Patient awake    Reviewed: Allergy & Precautions, NPO status , Patient's Chart, lab work & pertinent test results  History of Anesthesia Complications Negative for: history of anesthetic complications  Airway Mallampati: II  TM Distance: >3 FB Neck ROM: Full    Dental  (+) Dental Advisory Given, Teeth Intact   Pulmonary neg pulmonary ROS,    breath sounds clear to auscultation       Cardiovascular (-) angina+ DVT (2014)  negative cardio ROS   Rhythm:Regular Rate:Normal     Neuro/Psych negative neurological ROS     GI/Hepatic Elevated LFTs Celiac disease   Endo/Other  Hypothyroidism   Renal/GU negative Renal ROS     Musculoskeletal   Abdominal   Peds  Hematology negative hematology ROS (+)   Anesthesia Other Findings Breast cancer: chemo, lumpectomy  Reproductive/Obstetrics                            Anesthesia Physical Anesthesia Plan  ASA: II  Anesthesia Plan: General   Post-op Pain Management:    Induction: Intravenous  Airway Management Planned: Oral ETT  Additional Equipment:   Intra-op Plan:   Post-operative Plan: Extubation in OR  Informed Consent: I have reviewed the patients History and Physical, chart, labs and discussed the procedure including the risks, benefits and alternatives for the proposed anesthesia with the patient or authorized representative who has indicated his/her understanding and acceptance.   Dental advisory given  Plan Discussed with: CRNA and Surgeon  Anesthesia Plan Comments: (Plan routine monitors, GETA)        Anesthesia Quick Evaluation

## 2015-06-21 NOTE — Progress Notes (Signed)
Orthopedic Tech Progress Note Patient Details:  Nancy Beard May 02, 1956 [id number]  Ortho Devices Ortho Device/Splint Location: applied ohf to bed Ortho Device/Splint Interventions: Ordered, Application   Braulio Bosch 06/21/2015, 8:01 PM

## 2015-06-21 NOTE — Transfer of Care (Signed)
Immediate Anesthesia Transfer of Care Note  Patient: Nancy Beard  Procedure(s) Performed: Procedure(s): OPEN REDUCTION INTERNAL FIXATION (ORIF) TIBIAL PLATEAU REMOVAL OF EXTERNAL FISTULA (Left)  Patient Location: PACU  Anesthesia Type:General  Level of Consciousness: awake, alert  and oriented  Airway & Oxygen Therapy: Patient Spontanous Breathing and Patient connected to nasal cannula oxygen  Post-op Assessment: Report given to RN, Post -op Vital signs reviewed and stable and Patient moving all extremities X 4  Post vital signs: Reviewed and stable  Last Vitals:  Filed Vitals:   06/21/15 0547 06/21/15 1600  BP: 130/71 141/79  Pulse: 102 87  Temp: 36.7 C 36.8 C  Resp: 16 14    Complications: No apparent anesthesia complications

## 2015-06-21 NOTE — Progress Notes (Signed)
Physical Therapy Cancellation Note    06/21/15 1612  PT Visit Information  Last PT Received On 06/21/15  Assistance Needed +1  Reason Eval/Treat Not Completed Patient at procedure or test/unavailable  History of Present Illness 59 y.o. female with fall resulting in fracture of left bicondylar tibial plateau, s/p Application of spanning external fixator, left leg. Patient has a history of hypothyroidism, celiac sprue, breast cancer, and RLE DVT.   Earney Navy, PTA [phone]

## 2015-06-21 NOTE — Care Management (Signed)
Patient's husband spoke with Case manager concerning fact that he is very concerned about wife after discharge. He states that he has to return to work and there will be noone to assist her at home. Case manager is contacting worker's Production assistant, radio and has left note for Dr. Marcelino Scot. Patient is currently in surgery. Case manager will continue to monitor.

## 2015-06-21 NOTE — Progress Notes (Signed)
  I discussed with the patient and her husband the risks and benefits of surgery for her left tibial plateau, including the possibility of infection, nerve injury, vessel injury, wound breakdown, arthritis, symptomatic hardware, DVT/ PE, loss of motion, and need for further surgery among others.  We also specifically discussed the elevated risk of soft tissue breakdown that could lead to amputation.  They both understood these risks and wished to proceed.  Altamese [place], MD [facility] [phone] (p)

## 2015-06-22 ENCOUNTER — Encounter (HOSPITAL_COMMUNITY): Payer: Self-pay | Admitting: Orthopedic Surgery

## 2015-06-22 DIAGNOSIS — N390 Urinary tract infection, site not specified: Secondary | ICD-10-CM | POA: Diagnosis not present

## 2015-06-22 LAB — CBC
HCT: 29.3 % — ABNORMAL LOW (ref 36.0–46.0)
Hemoglobin: 9.7 g/dL — ABNORMAL LOW (ref 12.0–15.0)
MCH: 28.5 pg (ref 26.0–34.0)
MCHC: 33.1 g/dL (ref 30.0–36.0)
MCV: 86.2 fL (ref 78.0–100.0)
Platelets: 366 10*3/uL (ref 150–400)
RBC: 3.4 MIL/uL — ABNORMAL LOW (ref 3.87–5.11)
RDW: 14 % (ref 11.5–15.5)
WBC: 10.3 10*3/uL (ref 4.0–10.5)

## 2015-06-22 LAB — BASIC METABOLIC PANEL
Anion gap: 9 (ref 5–15)
BUN: 8 mg/dL (ref 6–20)
CO2: 25 mmol/L (ref 22–32)
Calcium: 8.6 mg/dL — ABNORMAL LOW (ref 8.9–10.3)
Chloride: 97 mmol/L — ABNORMAL LOW (ref 101–111)
Creatinine, Ser: 0.62 mg/dL (ref 0.44–1.00)
GFR calc Af Amer: >60 mL/min (ref >60)
GFR calc non Af Amer: >60 mL/min (ref >60)
Glucose, Bld: 130 mg/dL — ABNORMAL HIGH (ref 65–99)
Potassium: 3.6 mmol/L (ref 3.5–5.1)
Sodium: 131 mmol/L — ABNORMAL LOW (ref 135–145)

## 2015-06-22 MED ORDER — PANTOPRAZOLE SODIUM 40 MG PO TBEC
40.0000 mg | DELAYED_RELEASE_TABLET | Freq: Every day | ORAL | Status: DC
  Administered 2015-06-22 – 2015-06-24 (×3): 40 mg via ORAL
  Filled 2015-06-22 (×3): qty 1

## 2015-06-22 MED ORDER — CALCIUM CITRATE 950 (200 CA) MG PO TABS
600.0000 mg | ORAL_TABLET | Freq: Every day | ORAL | Status: DC
  Administered 2015-06-22 – 2015-06-24 (×3): 600 mg via ORAL
  Filled 2015-06-22 (×3): qty 3

## 2015-06-22 MED ORDER — NITROFURANTOIN MONOHYD MACRO 100 MG PO CAPS
100.0000 mg | ORAL_CAPSULE | Freq: Two times a day (BID) | ORAL | Status: DC
  Administered 2015-06-22 – 2015-06-24 (×5): 100 mg via ORAL
  Filled 2015-06-22 (×7): qty 1

## 2015-06-22 MED ORDER — DIPHENHYDRAMINE HCL 25 MG PO CAPS: 25.0000 mg | ORAL_CAPSULE | Freq: Three times a day (TID) | ORAL | Status: DC | PRN

## 2015-06-22 NOTE — Progress Notes (Signed)
Physical Therapy Treatment Patient Details Name: Nancy Beard MRN: [id number] DOB: [birth year] Today's Date: 06/22/2015    History of Present Illness 59 y.o. female with fall resulting in fracture of left bicondylar tibial plateau, s/p Application of spanning external fixator, left leg. Patient has a history of hypothyroidism, celiac sprue, breast cancer, and RLE DVT.    PT Comments    Patient lead through exercises for increased ROM and strength of Lt knee. Patient was limited by pain. Continue to progress as tolerated.   Follow Up Recommendations  Home health PT;Supervision for mobility/OOB     Equipment Recommendations  Rolling walker with 5" wheels;3in1 (PT);Wheelchair (measurements PT);Wheelchair cushion (measurements PT)    Recommendations for Other Services       Precautions / Restrictions Precautions Precautions: Fall Required Braces or Orthoses: Other Brace/Splint (external fixator) Other Brace/Splint: foot plate splint Restrictions Weight Bearing Restrictions: Yes LLE Weight Bearing: Non weight bearing    Mobility  Bed Mobility Overal bed mobility: Needs Assistance Bed Mobility: Sit to Supine     Supine to sit: HOB elevated;Min assist Sit to supine: Mod assist   General bed mobility comments: assistance bringing LLE to EOB and cues for technique and extra time  Transfers Overall transfer level: Needs assistance Equipment used: Rolling walker (2 wheeled) Transfers: Sit to/from [facility] Sit to Stand: Min guard Stand pivot transfers: Min guard;Min assist       General transfer comment: cues for hand placement; compliant with NWB status LLE; tendency to slide foot to turn instead of take steps due to fatigue  Ambulation/Gait                 Stairs            Wheelchair Mobility    Modified Rankin (Stroke Patients Only)       Balance     Sitting balance-Leahy Scale: Good       Standing balance-Leahy Scale:  Poor                      Cognition Arousal/Alertness: Awake/alert Behavior During Therapy: WFL for tasks assessed/performed Overall Cognitive Status: Within Functional Limits for tasks assessed                      Exercises General Exercises - Lower Extremity Quad Sets: AROM;10 reps;Left Heel Slides: AAROM;Left;10 reps Hip ABduction/ADduction: AROM;10 reps;Left    General Comments        Pertinent Vitals/Pain Pain Assessment: 0-10 Pain Score: 6  Pain Location: lateral L knee Pain Descriptors / Indicators: Aching Pain Intervention(s): Limited activity within patient's tolerance;Monitored during session;Premedicated before session;Patient requesting pain meds-RN notified;Ice applied    Home Living                      Prior Function            PT Goals (current goals can now be found in the care plan section) Acute Rehab PT Goals PT Goal Formulation: With patient/family Time For Goal Achievement: 07/01/15 Potential to Achieve Goals: Good Progress towards PT goals: Progressing toward goals    Frequency  Min 5X/week    PT Plan Current plan remains appropriate    Co-evaluation             End of Session Equipment Utilized During Treatment: Gait belt Activity Tolerance: Patient tolerated treatment well Patient left: in chair;with call bell/phone within reach;with nursing/sitter in room  Time: [id number] PT Time Calculation (min) (ACUTE ONLY): 32 min  Charges:  $Therapeutic Exercise: 8-22 mins $Therapeutic Activity: 8-22 mins                    G Codes:      Salina April, PTA Pager: [phone]   06/22/2015, 3:23 PM

## 2015-06-22 NOTE — Op Note (Signed)
[name], [name]             ACCOUNT NO.:  [id number]  MEDICAL RECORD NO.:  [id number]  LOCATION:  5N07C                        FACILITY:  [facility]  PHYSICIAN:  Nancy Beard, M.D. DATE OF BIRTH:  [birth year]  DATE OF PROCEDURE:  06/21/2015 DATE OF DISCHARGE:                              OPERATIVE REPORT   PREOPERATIVE DIAGNOSES: 1. Left bicondylar tibial plateau fracture. 2. Retained external fixator.  POSTOPERATIVE DIAGNOSES: 1. Left bicondylar tibial plateau fracture. 2. Retained external fixator. 3. Lateral meniscus tear of the mid body.  PROCEDURE: 1. Open reduction and internal fixation of left bicondylar tibial     plateau. 2. Repair of lateral meniscus through an arthrotomy. 3. Anterior compartment fasciotomy. 4. Removal of external fixator under anesthesia. 5. Curettage and debridement of ulcerative pin sites.  SURGEON:  Nancy Divine. Marcelino Scot, MD  ASSISTANT:  Nancy Spinner, PA-C  ANESTHESIA:  General.  COMPLICATIONS:  None.  TOURNIQUET:  None.  ESTIMATED BLOOD LOSS:  Approximately 160 mL.  DISPOSITION:  To PACU.  CONDITION:  Stable.  BRIEF SUMMARY AND INDICATION FOR PROCEDURE:  Nancy Beard is a very pleasant 59 year old female, past medical history notable for breast malignancy who sustained a work-related fall resulting in bicondylar tibial plateau fracture which was treated with emergent external fixation.  Patient has subsequently been hospitalized for strict elevation, icing, and compression while attempting to mobilize with therapy.  She now returns to the OR for definitive internal fixation and removal of the fixator.  We did discuss with her preoperatively, risks and benefits of surgical repair including possibility of infection, nerve injury, vessel injury, DVT, PE, heart attack, stroke, nonunion, malunion, symptomatic hardware, arthritis, loss of motion, need for further surgery, increased possibility of total knee arthroplasty in her future.   I had full discussion of these and other risks, the patient and her husband wished to proceed.  We also did specifically discuss DVT prophylaxis given her history of DVT in the contralateral extremity.  BRIEF SUMMARY AND PROCEDURE:  Patient was given preoperative antibiotics, taken to the operating room where general anesthesia was induced.  Her left lower extremity underwent removal of the fixator because of some erythematous ulcerations around the thigh and tibia.  At that point, we performed a thorough chlorhexidine scrub of the entire left lower extremity.  We then performed a standard Betadine scrub and paint thereafter.  I began with the ulcerated pin sites where a curette was used to remove the necrotic and compromised tissue at the skin, subcutaneous fascia, and bone interfaces with the pin tract.  I performed this on the tibial pins and on the femoral pins.  Each pin site was treated in like manner thoroughly curettaged from skin to bone and then irrigated to remove all contaminated devitalized debris.  The pin sites themselves were then isolated from the wound with Ray-Tec sponges and Ioban.  Nancy Spinner, PA-C did assist me with this portion to expedite her care by working simultaneously.  We then turned our attention to the tibial plateau fracture where a curvilinear anterolateral incision was made.  We did incise the retinaculum proximal to the plateau and then incised the coronary ligament along it's insertion.  0 Prolene was used to  reflect it and at that point, I was able to identify a peripheral tear in the mid body just a little toward the anterior horn, it was through and through and was at the junction of the red and white zone.  It was repaired with the Prolene using vertical mattress sutures.  Also through the arthrotomy, we were able to see marked depression of the posterolateral surface. There was also some depression along the tibial eminence.  Consequently, I  found the exit of the fracture in the lateral tibial metaphysis and used a 15 blade to release it proximally back toward the joint we booked open with a posterior and distal hinge.  I was then able to clean out the fracture site and do some limited elevation followed by reduction of this fragment and K-wire fixation.  Unfortunately, onto the outside metaphyseal bone effectively reduced and the articular surface remained in a depressed position.  Consequently, after we had restored integrity to the outside bone, I then made a trapdoor in the metaphysis and with my assistant pulling longitudinal traction and varus in sequential fashion to elevate the joint surface, both along the lateral plateau as well as toward the eminence,  this did not displace the medial tibial plateau fracture component which was transverse and below the subchondral bone.  This was done under direct fluoro imaging for assistance after it was complete, it was secured provisionally with K- wire fixation and I did place 2 anterior to posterior pins underneath the lateral plateau to reduce the likelihood of articular displacement and said that this segment of bone would move as an entire block.  The K- wires were also helpful initially for use as joysticks.  40 mL of cancellous chips were then placed into the large cavitary defect and used to help tamp the joint space up into reduced position.  Once this was complete, we then placed additional K-wires through the plate using King-Tong clamp at the level of the subchondral bone to achieve compression across the joint surface and then placed standard conical screws to maximally appose the plate to the bone, particularly along the past posterior aspect as the King-Tong was more toward the central or anterior portion and then additional screws placed at the articular level using locked fixation after again increasing the compression across the articular surface with  tightening of the Erma Pinto and then screws were placed into the shaft.  The C-arm was brought in and AP and lateral images obtained with the knee in full extension confirming appropriate alignment, restoration of the surface, it was quite stable to varus and valgus force in full extension as well.  Hardware appeared to be in appropriate place, trajectory, and length.  Lastly, I returned to the leg where the distal edge of the incision extending under the skin and distally down to the ankle.  I performed an anterior compartment fasciotomy to reduce the likelihood of the potential for compartment syndrome given the timing of this acute fracture repair.  My assistant used an [employer] to retract the skin away from the fascia and then with the long Metzenbaum scissors, I spread both superficial and deep to the anterior compartment fascia and released it along it's entirety of the scissor tips pointed away from the superficial peroneal nerve down to the ankle.  Thorough irrigation was performed in a standard layered closure using #1 Vicryl for the retinaculum, 0 Prolene vertical mattress sutures for repair of the coronary ligament, and then 2-0 Vicryl and 3-0 nylon for  the subcu and skin.  Sterile gently compressive dressing from the foot to the thigh was applied and then knee immobilizer.  Patient awakened from anesthesia and transferred to PACU in stable condition.  PROGNOSIS:  Nancy Beard will go immediately back on her blood thinner for deep venous thrombosis prophylaxis which we anticipate continuing for least 20 days after discharge.  At this point, she may be going to rehab or may be going home with home health assistance.  She will have unrestricted range of motion of the knee and ankle.  She will have a hinged knee brace to facilitate early motion given her initial injury pattern, but appeared to have excellent ligamentous stability.  The severity of her articular injury and reduced  bone quality increased the likelihood of complications and subsequent fractures.  She may develop a nonunion of posttraumatic arthritis with subsidence of her graft.  We are hopeful these factors were mitigated by restoration of alignment or ligamentous integrity, repair of the lateral meniscus and the articular congruity, we were able to obtain today.     Nancy Beard, M.D.     MHH/MEDQ  D:  06/21/2015  T:  06/22/2015  Job:  [id number]

## 2015-06-22 NOTE — Care Management Note (Addendum)
Case Management Note  Patient Details  Name: Nancy Beard MRN: [id number] Date of Birth: 1956-05-14  Subjective/Objective:   59 yr old female s/p ORIF of left Bicondylar tib/plateau fracture. Removal of external fixator.                Action/Plan:Case manager has spoken with Rob Bunting, adjuster with [facility]. Received message to contact Bernadette Hoit, Workers comp Armed forces operational officer. [phone]. Case manager informed her of conversation with patient's husband concerning her going home, or to a facility. [name] states he has to return to work and patient will be unattended all day. Concerned for her safety. Case manager faxed orders for home health and DME to Rob Bunting on 06/21/15. Case manager will continue to monitor.  Expected Discharge Date:                  Expected Discharge Plan:     In-House Referral:  NA  Discharge planning Services  CM Consult  Post Acute Care Choice:  Home Health, Durable Medical Equipment Choice offered to:   (Patient is under worker's comp. CM waiting for Adjuster  to assign)  DME Arranged:  3-N-1, Walker rolling, Wheelchair manual DME Agency:   (waiting for [employer] comp to assign agency)  HH Arranged:  PT [place] Agency:   (waiting for worker's comp adjuster to assign agency)  Status of Service:  In process, will continue to follow  Medicare Important Message Given:    Date Medicare IM Given:    Medicare IM give by:    Date Additional Medicare IM Given:    Additional Medicare Important Message give by:     If discussed at [place] of Stay Meetings, dates discussed:    Additional Comments: 1330  Case manager received call from [place] with [employer] concerning home health and DME. Case manager faxed OP note, H&P, orders, demographics to him at [phone] alternate fax is [phone], phone # [phone].  06/23/15 11:01am : Patient's husband remains adamant that she can not go home in "present condition". Case manager  had physical therapist, Occupational therapist also attempt to explain to [name] the level of care patient needs and equipment, and that she would be fine to recover at home. [name] remains adamant that she requires further therapy. Case manager contacted [place] Case Manager @[phone] to update her. Ainsley Spinner, PA has requested a consult from [facility] for patient.  06/23/15  12:45pm  Case manager was informed that patient will be accepted to [facility], bed will be available on 06/24/15. Case manager provided Karene Fry, RN , admissions coordinator, with the contact information for patient's adjuster for [employer], [employer] phone number as well as Bernadette Hoit, Medical Nurse Case Freight forwarder for [employer].      Ninfa Meeker, RN   06/22/2015, 10:29 AM

## 2015-06-22 NOTE — Progress Notes (Signed)
Orthopedic Tech Progress Note Patient Details:  ARNOLD GOLDY Jul 01, 1956 [id number]  Ortho Devices Type of Ortho Device:  (footsie roll) Ortho Device/Splint Location: lle Ortho Device/Splint Interventions: Loanne Drilling, Zunairah Devers 06/22/2015, 10:05 AM

## 2015-06-22 NOTE — Progress Notes (Signed)
Orthopaedic Trauma Service Progress Note  Subjective  Doing ok  Severe pain last night Little sleep  Tolerating diet  Review of Systems  Constitutional: Negative for fever and chills.  Respiratory: Negative for shortness of breath and wheezing.   Cardiovascular: Negative for chest pain and palpitations.  Gastrointestinal: Negative for nausea and vomiting.  Genitourinary: Negative for dysuria and urgency.  Neurological: Negative for headaches.     Objective   BP 113/72 mmHg  Pulse 107  Temp(Src) 98.7 F (37.1 C) (Oral)  Resp 16  Ht '5\' 5"'  (1.651 m)  Wt 74.39 kg (164 lb)  BMI 27.29 kg/m2  SpO2 100%  LMP 09/11/2012  Intake/Output      12/06 0701 - 12/07 0700 12/07 0701 - 12/08 0700   P.O.     I.V. (mL/kg) 1000 (13.4)    Total Intake(mL/kg) 1000 (13.4)    Urine (mL/kg/hr) 1725 (1)    Stool     Blood 150 (0.1)    Total Output 1875     Net -875            Labs  Results for AARIANA, SHANKLAND (MRN [id number]) as of 06/22/2015 09:13  Ref. Range 06/22/2015 04:45  Sodium Latest Ref Range: 135-145 mmol/L 131 (L)  Potassium Latest Ref Range: 3.5-5.1 mmol/L 3.6  Chloride Latest Ref Range: 101-111 mmol/L 97 (L)  CO2 Latest Ref Range: 22-32 mmol/L 25  BUN Latest Ref Range: 6-20 mg/dL 8  Creatinine Latest Ref Range: 0.44-1.00 mg/dL 0.62  Calcium Latest Ref Range: 8.9-10.3 mg/dL 8.6 (L)  EGFR (Non-African Amer.) Latest Ref Range: >60 mL/min >60  EGFR (African American) Latest Ref Range: >60 mL/min >60  Glucose Latest Ref Range: 65-99 mg/dL 130 (H)  Anion gap Latest Ref Range: 5-15  9  WBC Latest Ref Range: 4.0-10.5 K/uL 10.3  RBC Latest Ref Range: 3.87-5.11 MIL/uL 3.40 (L)  Hemoglobin Latest Ref Range: 12.0-15.0 g/dL 9.7 (L)  HCT Latest Ref Range: 36.0-46.0 % 29.3 (L)  MCV Latest Ref Range: 78.0-100.0 fL 86.2  MCH Latest Ref Range: 26.0-34.0 pg 28.5  MCHC Latest Ref Range: 30.0-36.0 g/dL 33.1  RDW Latest Ref Range: 11.5-15.5 % 14.0  Platelets Latest Ref Range: 150-400  K/uL 366   Results for SHONIA, SKILLING (MRN [id number]) as of 06/22/2015 09:13  Ref. Range 06/21/2015 21:52  Appearance Latest Ref Range: CLEAR  CLOUDY (A)  Bacteria, UA Latest Ref Range: NONE SEEN  MANY (A)  Bilirubin Urine Latest Ref Range: NEGATIVE  NEGATIVE  Color, Urine Latest Ref Range: YELLOW  YELLOW  Glucose Latest Ref Range: NEGATIVE mg/dL NEGATIVE  Hgb urine dipstick Latest Ref Range: NEGATIVE  SMALL (A)  Ketones, ur Latest Ref Range: NEGATIVE mg/dL NEGATIVE  Leukocytes, UA Latest Ref Range: NEGATIVE  LARGE (A)  Nitrite Latest Ref Range: NEGATIVE  POSITIVE (A)  pH Latest Ref Range: 5.0-8.0  6.0  Protein Latest Ref Range: NEGATIVE mg/dL NEGATIVE  RBC / HPF Latest Ref Range: 0-5 RBC/hpf 6-30  Specific Gravity, Urine Latest Ref Range: 1.005-1.030  1.016  Squamous Epithelial / LPF Latest Ref Range: NONE SEEN  0-5 (A)  Urine-Other Unknown MUCOUS PRESENT  WBC, UA Latest Ref Range: 0-5 WBC/hpf TOO NUMEROUS TO C...   Exam  Gen: awake, alert, NAD, appears comfortable, resting in bed Lungs: clear B  Cardiac: tachy but reg, s1 and s2 Abd: + BS, NTND Ext:       Left Lower Extremity   Dressing c/d/i  Ext warm  + DP pulse  No  dct   Compartments soft  DPN, SPN, TN sensation intact  EHL, FHL, AT, PT, peroneals, gastroc motor intact  Active ankle extension weak  Swelling stable     Assessment and Plan   POD/HD#: 1   59 y/o female s/p low energy fall with complex bicondylar Left tibial plateau fracture  1. Fall at work  2. Comminuted bicondylar L tibial plateau fracture s/p ORIF              NWB               Ok to get out of bed with assist             PT/OT evals             Ice               Elevate leg above heart               Night splint to help prevent equinus contracture- ok to take off for ROM   Hinged knee brace  Unrestricted ROM L knee   L knee ROM- AROM, PROM. Prone exercises as well, if feasible. No ROM restrictions.  Quad sets, SLR, LAQ, SAQ, heel  slides, stretching, prone flexion and extension  No pillows under bend of knee when at rest, ok to place under heel to help work on extension or use zero knee bone foam               3. Pain management:             continue with current regimen     Use percocet as primary medication   Oxy IR for breakthrough pain    IV morphine for severe breakthrough pain  4. ABL anemia/Hemodynamics             Stable             Cbc in am   5. Medical issues               Hypothyroid                         Home meds                         TSH ok    UTI   U/a suggestive of UTI   Will start Nitrofurantoin ER 177m po q12h x 7 days   Urine culture pending, will adjust accordingly   6. DVT/PE prophylaxis, hx of R leg DVT:             Lovenox x 21 days  7.ID:               periop abx  8.Metabolic Bone Disease:             Vitamin D insufficiency and mild hyperparathyroidism                         Add vitamin D supplementation                         Would like to obtain 1,25 vitamin d level but lab no longer offered at this institution- can do as outpt      Add vitamin C and supplemental calcium   9.Activity:  Up with assistance             NWB Left leg  10. FEN/Foley/Lines:             Diet as tolerated  11. Dispo:             PT/OT  Home end of week     Jari Pigg, PA-C [facility] [phone] (O) 06/22/2015 9:12 AM

## 2015-06-22 NOTE — Progress Notes (Signed)
Orthopedic Tech Progress Note Patient Details:  Nancy Beard 1956-06-21 [id number]  Ortho Devices Type of Ortho Device:  (prafo boot) Ortho Device/Splint Location: lle Ortho Device/Splint Interventions: Application   Danie Hannig 06/22/2015, 9:52 AM

## 2015-06-22 NOTE — Progress Notes (Signed)
Occupational Therapy Treatment Patient Details Name: Nancy Beard MRN: [id number] DOB: 01/08/1956 Today's Date: 06/22/2015    History of present illness 59 y.o. female with fall resulting in fracture of left bicondylar tibial plateau, s/p Application of spanning external fixator, left leg. Patient has a history of hypothyroidism, celiac sprue, breast cancer, and RLE DVT.   OT comments  Pt. Very motivated and Making great gains with acute OT goals.  Able to maintain NWB status of LLE during bed mobility and stand pivot transfers x2.  Will continue to follow acutely.    Follow Up Recommendations  No OT follow up;Supervision/Assistance - 24 hour    Equipment Recommendations  3 in 1 bedside comode    Recommendations for Other Services      Precautions / Restrictions Precautions Precautions: Fall Other Brace/Splint: pt. with L prafo boot, awaiting hinged knee brace, able to transfer with KI on LLE per RN while awaiting hinged knee brace Restrictions Weight Bearing Restrictions: Yes LLE Weight Bearing: Non weight bearing       Mobility Bed Mobility Overal bed mobility: Needs Assistance Bed Mobility: Supine to Sit     Supine to sit: HOB elevated;Min assist     General bed mobility comments: assistance bringing LLE to EOB and cues for technique and extra time  Transfers Overall transfer level: Needs assistance Equipment used: Rolling walker (2 wheeled) Transfers: Sit to/from [facility] Sit to Stand: Min guard;Min assist Stand pivot transfers: Min assist       General transfer comment: pt. able to maintain nwb status and engage in pivotal scoots from bed to bsc to recliner    Balance                                   ADL Overall ADL's : Needs assistance/impaired     Grooming: Wash/dry hands;Sitting;Set up                   Toilet Transfer: Minimal assistance;Stand-pivot;BSC   Toileting- Clothing Manipulation and Hygiene:  Minimal assistance;Sit to/from stand       Functional mobility during ADLs: Minimal assistance General ADL Comments: pt. with noted improvement this session.  able to complete bsc transfer and then transfer to recliner      Vision                     Perception     Praxis      Cognition   Behavior During Therapy: [facility] for tasks assessed/performed Overall Cognitive Status: Within Functional Limits for tasks assessed                       Extremity/Trunk Assessment               Exercises     Shoulder Instructions       General Comments      Pertinent Vitals/ Pain       Pain Assessment: No/denies pain  Home Living                                          Prior Functioning/Environment              Frequency Min 3X/week     Progress Toward Goals  OT Goals(current goals can now be  found in the care plan section)  Progress towards OT goals: Progressing toward goals     Plan Discharge plan remains appropriate    Co-evaluation                 End of Session Equipment Utilized During Treatment: Gait belt;Rolling walker;Other (comment) (L KI, and L prafo boot (pt. wanted it on for entire session) but not the compression components)   Activity Tolerance Patient tolerated treatment well   Patient Left in chair;with call bell/phone within reach   Nurse Communication Mobility status;Other (comment) (spoke with RN she states ok for stand pivot with KI (hinged brace not arrived yet))        Time: [id number] OT Time Calculation (min): 26 min  Charges: OT General Charges $OT Visit: 1 Procedure OT Treatments $Self Care/Home Management : 23-37 mins  Janice Coffin, COTA/L 06/22/2015, 11:32 AM

## 2015-06-22 NOTE — Progress Notes (Signed)
Orthopedic Tech Progress Note Patient Details:  Nancy Beard August 25, 1955 [id number]  Patient ID: Darrall Dears, female   DOB: 1955/10/28, 59 y.o.   MRN: [id number] Called in advanced brace order; spoke with Dyann Kief, Dalena Plantz 06/22/2015, 9:39 AM

## 2015-06-23 DIAGNOSIS — S82142S Displaced bicondylar fracture of left tibia, sequela: Secondary | ICD-10-CM

## 2015-06-23 LAB — CBC
HCT: 27.5 % — ABNORMAL LOW (ref 36.0–46.0)
Hemoglobin: 9.1 g/dL — ABNORMAL LOW (ref 12.0–15.0)
MCH: 28.7 pg (ref 26.0–34.0)
MCHC: 33.1 g/dL (ref 30.0–36.0)
MCV: 86.8 fL (ref 78.0–100.0)
Platelets: 339 10*3/uL (ref 150–400)
RBC: 3.17 MIL/uL — ABNORMAL LOW (ref 3.87–5.11)
RDW: 14.1 % (ref 11.5–15.5)
WBC: 9.1 10*3/uL (ref 4.0–10.5)

## 2015-06-23 LAB — BASIC METABOLIC PANEL
Anion gap: 8 (ref 5–15)
BUN: 5 mg/dL — ABNORMAL LOW (ref 6–20)
CO2: 27 mmol/L (ref 22–32)
Calcium: 8.8 mg/dL — ABNORMAL LOW (ref 8.9–10.3)
Chloride: 99 mmol/L — ABNORMAL LOW (ref 101–111)
Creatinine, Ser: 0.55 mg/dL (ref 0.44–1.00)
GFR calc Af Amer: >60 mL/min (ref >60)
GFR calc non Af Amer: >60 mL/min (ref >60)
Glucose, Bld: 126 mg/dL — ABNORMAL HIGH (ref 65–99)
Potassium: 3.5 mmol/L (ref 3.5–5.1)
Sodium: 134 mmol/L — ABNORMAL LOW (ref 135–145)

## 2015-06-23 LAB — RETICULOCYTES
RBC.: 3.17 MIL/uL — ABNORMAL LOW (ref 3.87–5.11)
Retic Count, Absolute: 91.9 10*3/uL (ref 19.0–186.0)
Retic Ct Pct: 2.9 % (ref 0.4–3.1)

## 2015-06-23 LAB — FOLATE: Folate: 30.4 ng/mL (ref >5.9)

## 2015-06-23 LAB — IRON AND TIBC
Iron: 12 ug/dL — ABNORMAL LOW (ref 28–170)
Saturation Ratios: 5 % — ABNORMAL LOW (ref 10.4–31.8)
TIBC: 237 ug/dL — ABNORMAL LOW (ref 250–450)
UIBC: 225 ug/dL

## 2015-06-23 LAB — FERRITIN: Ferritin: 165 ng/mL (ref 11–307)

## 2015-06-23 LAB — VITAMIN B12: Vitamin B-12: 2551 pg/mL — ABNORMAL HIGH (ref 180–914)

## 2015-06-23 NOTE — Progress Notes (Signed)
Physical Therapy Treatment Patient Details Name: JUSTICE GIACOMO MRN: [id number] DOB: Aug 12, 1955 Today's Date: 06/23/2015    History of Present Illness 59 y.o. female with fall resulting in fracture of left bicondylar tibial plateau, s/p Application of spanning external fixator, left leg. Patient has a history of hypothyroidism, celiac sprue, breast cancer, and RLE DVT.    PT Comments    Patient is making progress toward mobility goals with ambulation of 20 ft min guard with compliance of NWB status Lt LE. Overall mobility level of min A/min guard for transfers and gait training. Patient presented with increased activity tolerance today and is motivated to participate in PT. Patient's husband works outside of home most of the day and pt will need assistance if d/c home. Patient will benefit from further skilled PT to increase independence and safety with mobility. Recommending [facility] for further skilled PT services.   Follow Up Recommendations  [facility];Home health PT;Supervision for mobility/OOB     Equipment Recommendations  Rolling walker with 5" wheels;3in1 (PT);Wheelchair (measurements PT);Wheelchair cushion (measurements PT)    Recommendations for Other Services       Precautions / Restrictions Precautions Precautions: Fall Required Braces or Orthoses: Other Brace/Splint (external fixator) Other Brace/Splint: foot plate splint Restrictions Weight Bearing Restrictions: Yes LLE Weight Bearing: Non weight bearing    Mobility  Bed Mobility               General bed mobility comments: up in chair upon arrival  Transfers Overall transfer level: Needs assistance Equipment used: Rolling walker (2 wheeled) Transfers: Sit to/from [facility] Sit to Stand: Min guard;Min assist Stand pivot transfers: Min guard       General transfer comment: compliant with NWB status; carry over of hand placement and technique; min A for elevation of Lt LE to scoot to edge of  chair; no assist for standing   Ambulation/Gait Ambulation/Gait assistance: Min guard Ambulation Distance (Feet): 20 Feet Assistive device: Rolling walker (2 wheeled) Gait Pattern/deviations: Step-to pattern Gait velocity: decreased   General Gait Details: standing rest break required; ability to step to instead of slide Lt foot for ambulation compared to previous tx; cues for upright posture and sequecing of gait pattern with use of AD to decrease lean on walker vs using it to weight shift and offload Lt LE for advancement   Stairs            Wheelchair Mobility    Modified Rankin (Stroke Patients Only)       Balance Overall balance assessment: Needs assistance Sitting-balance support: Feet supported Sitting balance-Leahy Scale: Good     Standing balance support: Bilateral upper extremity supported;During functional activity Standing balance-Leahy Scale: Poor                      Cognition Arousal/Alertness: Awake/alert Behavior During Therapy: WFL for tasks assessed/performed Overall Cognitive Status: Within Functional Limits for tasks assessed                      Exercises General Exercises - Lower Extremity Quad Sets: AROM;10 reps;Left Heel Slides: AAROM;Left;10 reps Hip ABduction/ADduction: AROM;10 reps;Left    General Comments General comments (skin integrity, edema, etc.): pt had increased activity tolerance today compared to previous tx and is motivated to participate in therapy      Pertinent Vitals/Pain Pain Assessment: Faces Faces Pain Scale: Hurts even more (with activity only) Pain Location: Lt LE Pain Descriptors / Indicators: Aching Pain Intervention(s): Limited activity  within patient's tolerance;Monitored during session;Patient requesting pain meds-RN notified;Repositioned    Home Living                      Prior Function            PT Goals (current goals can now be found in the care plan section) Acute  Rehab PT Goals Patient Stated Goal: go to inpatient rehab before home PT Goal Formulation: With patient/family Time For Goal Achievement: 07/01/15 Potential to Achieve Goals: Good    Frequency  Min 5X/week    PT Plan Current plan remains appropriate    Co-evaluation             End of Session Equipment Utilized During Treatment: Gait belt Activity Tolerance: Patient tolerated treatment well Patient left: in chair;with call bell/phone within reach;with nursing/sitter in room     Time: [id number] PT Time Calculation (min) (ACUTE ONLY): 40 min  Charges:  $Gait Training: 8-22 mins $Therapeutic Exercise: 8-22 mins $Therapeutic Activity: 8-22 mins                    G Codes:      Salina April, PTA Pager: [phone]   06/23/2015, 12:08 PM

## 2015-06-23 NOTE — Progress Notes (Signed)
Orthopedic Tech Progress Note Patient Details:  Nancy Beard [birth year] [id number] Brace order completed by hanger vendor. Patient ID: Nancy Beard, female   DOB: March 01, 1956, 59 y.o.   MRN: [id number]   Braulio Bosch 06/23/2015, 3:00 PM

## 2015-06-23 NOTE — H&P (Signed)
Physical Medicine and Rehabilitation Admission H&P    Chief Complaint  Patient presents with  . Fall  : HPI: Nancy Beard is a 59 y.o. right handed female admitted 06/15/2015. Independent prior to admission living with her husband. Two-level home bedroom on the main floor. Steps to entry. Reported fall 06/14/2015 down approximately 3 steps while at work. Patient works at [employer] [place]. She lost her footing and fell landing on her left hip. No loss of consciousness. X-rays and imaging revealed left bicondylar tibial plateau fracture. Underwent closed reduction of left bicondylar tibial plateau fracture application of spanning external fixator 06/15/2015 per Dr. Marcelino Scot and on 06/22/2015 underwent ORIF as well as repair of lateral meniscus through an arthrotomy, anterior compartment fasciotomy and removal of external fixator. Nonweightbearing left lower extremity. Subcutaneous Lovenox for DVT prophylaxis. Acute blood loss anemia 9.1 and monitored. Urine study 06/21/2015 greater than 100,000 gram-negative rods placed empirically on Macrobid with sensitivities pending. Hospital course pain management. Physical and occupational therapy evaluations completed and ongoing. M.D. has requested physical medicine rehabilitation consult. Patient was admitted for comprehensive rehabilitation program    ROS Constitutional: Negative for chills.  HENT: Negative for hearing loss.  Eyes: Negative for blurred vision and double vision.  Respiratory: Negative for cough and shortness of breath.  Cardiovascular: Negative for chest pain, palpitations and leg swelling.  Gastrointestinal: Positive for constipation. Negative for nausea, vomiting and abdominal pain.  Genitourinary: Negative for dysuria and hematuria.  Musculoskeletal: Positive for myalgias and joint pain.   Recent fall landing on left hip  Skin: Negative for rash.  Neurological: Negative for seizures, loss of consciousness, weakness and  headaches.  Psychiatric/Behavioral: Positive for depression.  All other systems reviewed and are negative    Past Medical History  Diagnosis Date  . Thyroid disease   . Depression   . Sprue   . Breast cancer ([facility])     right  . Hypothyroidism   . DVT (deep venous thrombosis) ([place]) 2014    Right lower extremity, thigh  . Wears glasses   . Pneumonia     hx of years ago  . History of chemotherapy   . Neuropathy ([facility])     of fingers and toes  . HEMORRHOIDS, EXTERNAL 05/05/2007  . DIVERTICULOSIS, COLON 05/05/2007  . Vitamin D insufficiency 06/20/2015   Past Surgical History  Procedure Laterality Date  . Esophagogastroduodenoscopy  2007    sprue  . Colonoscopy  2006  . Cryoblation of cervix      long time ago  . Breast lumpectomy      benign lump  . Total mastectomy Bilateral 09/11/2012    Procedure: bilateral MASTECTOMY;  Surgeon: Odis Hollingshead, MD;  Location: [facility];  Service: General;  Laterality: Bilateral;  . Axillary sentinel node biopsy Right 09/11/2012    Procedure: AXILLARY SENTINEL lymph NODE  BIOPSY;  Surgeon: Odis Hollingshead, MD;  Location: [facility];  Service: General;  Laterality: Right;  right nuclear medicine injection 12:30   . Breast reconstruction with placement of tissue expander and flex hd (acellular hydrated dermis) Bilateral 09/11/2012    Procedure: BREAST RECONSTRUCTION WITH PLACEMENT OF TISSUE EXPANDER AND FLEX HD (ACELLULAR HYDRATED DERMIS) ADM;  Surgeon: Theodoro Kos, DO;  Location: [facility];  Service: Plastics;  Laterality: Bilateral;  . Incision and drainage of wound Left 09/16/2012    Procedure: Left Breast Evacuation of Hematoma;  Surgeon: Theodoro Kos, DO;  Location: [facility];  Service: Plastics;  Laterality: Left;  .  Portacath placement Right 10/09/2012    Procedure: US GUIDED INSERTION PORT-A-CATH;  Surgeon: Odis Hollingshead, MD;  Location: [facility];  Service: General;  Laterality: Right;  Right Subclavian Vein  . Removal of bilateral  tissue expanders with placement of bilateral breast implants Bilateral 06/24/2013    Procedure: REMOVAL OF BILATERAL TISSUE EXPANDERS WITH PLACEMENT OF BILATERAL BREAST IMPLANTS;  Surgeon: Theodoro Kos, DO;  Location: [facility];  Service: Plastics;  Laterality: Bilateral;  . Vulvectomy N/A 07/14/2013    Procedure: WIDE LOCAL  EXCISION VULVAR;  Surgeon: Alvino Chapel, MD;  Location: [facility];  Service: Gynecology;  Laterality: N/A;  . Port-a-cath removal Right 08/07/2013    Procedure: REMOVAL PORT-A-CATH;  Surgeon: Odis Hollingshead, MD;  Location: [facility];  Service: General;  Laterality: Right;  . Breast reconstruction Right 09/24/2013    Procedure: REVISION OF RIGHT BREAST RECONSTRUCTION WITH REPOSITIONING RIGHT IMPLANT, POSSIBLE EXCISION CAPSULAR CONTRACTURE AND LIPOFILLING FOR FAT GRAFTING;  Surgeon: Theodoro Kos, DO;  Location: [facility];  Service: Plastics;  Laterality: Right;  . Liposuction with lipofilling Bilateral 09/24/2013    Procedure: LIPOSUCTION WITH LIPOFILLING;  Surgeon: Theodoro Kos, DO;  Location: [facility];  Service: Plastics;  Laterality: Bilateral;  Biltalteal filling breast  . External fixation leg Left 06/15/2015    Procedure: EXTERNAL FIXATION LEG;  Surgeon: Altamese Antimony, MD;  Location: [place];  Service: Orthopedics;  Laterality: Left;  . Orif tibia plateau Left 06/21/2015    Procedure: OPEN REDUCTION INTERNAL FIXATION (ORIF) TIBIAL PLATEAU REMOVAL OF EXTERNAL FISTULA;  Surgeon: Altamese Jal, MD;  Location: [facility];  Service: Orthopedics;  Laterality: Left;   Family History  Problem Relation Age of Onset  . Breast cancer Paternal Aunt     dx in her 60s  . Lymphoma Paternal Uncle   . Breast cancer Paternal Grandmother     dx >50  . Heart attack Paternal Grandfather   . Breast cancer Other     2 maternal great aunts with breast cancer >50  . Osteoporosis Mother     controlled with diet/exercise  .  COPD Father    Social History:  reports that she has never smoked. She has never used smokeless tobacco. She reports that she does not drink alcohol or use illicit drugs. Allergies:  Allergies  Allergen Reactions  . Ciprofloxacin Itching and Other (See Comments)    Patient states she had muscle spasms  . Dilaudid [Hydromorphone Hcl] Itching  . Gluten Meal    Medications Prior to Admission  Medication Sig Dispense Refill  . alendronate (FOSAMAX) 70 MG tablet TAKE 1 TABLET BY MOUTH EVERY WEEK (Patient taking differently: TAKE 70 MG BY MOUTH EVERY WEEK ON SUNDAYS) 12 tablet 3  . anastrozole (ARIMIDEX) 1 MG tablet TAKE 1 TABLET BY MOUTH EVERY DAY (Patient taking differently: TAKE 1 MG BY MOUTH EVERY DAY) 90 tablet 1  . B Complex-C (SUPER B COMPLEX PO) Take 1 tablet by mouth daily.    . Cholecalciferol (VITAMIN D-3 PO) Take 1 tablet by mouth daily.     [name] gabapentin (NEURONTIN) 100 MG capsule Take 3 capsules (300 mg total) by mouth 3 (three) times daily. (Patient taking differently: Take 900 mg by mouth daily. ) 837 capsule 3  . levothyroxine (SYNTHROID, LEVOTHROID) 25 MCG tablet Take 1 tablet (25 mcg total) by mouth daily. 90 tablet 3  . liothyronine (CYTOMEL) 25 MCG tablet Take 0.5 tablets (12.5 mcg total) by mouth daily.  45 tablet 3  . Multiple Vitamin (MULTIVITAMIN) tablet Take 1 tablet by mouth daily.    . prochlorperazine (COMPAZINE) 10 MG tablet Take 1 tablet (10 mg total) by mouth every 6 (six) hours as needed for nausea or vomiting. 30 tablet 3  . venlafaxine XR (EFFEXOR-XR) 75 MG 24 hr capsule TAKE 2 CAPSULES (150 MG TOTAL) BY MOUTH DAILY WITH BREAKFAST. 180 capsule 0  . azithromycin (ZITHROMAX) 250 MG tablet Take 2 tabs on day 1, then 1 tab daily until finished (Patient not taking: Reported on 06/15/2015) 6 tablet 0  . guaiFENesin-codeine 100-10 MG/5ML syrup Take 2.5-5 mLs by mouth every 6 (six) hours as needed for cough. (Patient not taking: Reported on 06/15/2015) 180 mL 0  . XARELTO  20 MG TABS tablet TAKE 1 TABLET (20 MG TOTAL) BY MOUTH DAILY. (Patient not taking: Reported on 06/15/2015) 90 tablet 1    Home: [place] expects to be discharged to:: Private residence Living Arrangements: Spouse/significant other Available Help at Discharge: Family, Available 24 hours/day (this week) Type of Home: House Home Access: Stairs to enter [employer] of Steps: 2+1 Entrance Stairs-Rails: None Home Layout: Two level, Able to live on main level with bedroom/bathroom Bathroom Shower/Tub: Walk-in shower, Door (3 inch curb) Biochemist, clinical: Programmer, systems: Yes Home Equipment: None Additional Comments: friend has RW she can use   Functional History: Prior Function Level of Independence: Independent Comments: works Industrial/product designer Status:  Mobility: Bed Mobility Overal bed mobility: Needs Assistance Bed Mobility: Sit to Supine Supine to sit: HOB elevated, Min assist Sit to supine: Mod assist General bed mobility comments: up in chair upon arrival Transfers Overall transfer level: Needs assistance Equipment used: Rolling walker (2 wheeled) Transfers: Sit to/from Stand, Stand Pivot Transfers Sit to Stand: Min guard, Min assist Stand pivot transfers: Min guard General transfer comment: compliant with NWB status; carry over of hand placement and technique; min A for elevation of Lt LE to scoot to edge of chair; no assist for standing  Ambulation/Gait Ambulation/Gait assistance: Min guard Ambulation Distance (Feet): 20 Feet Assistive device: Rolling walker (2 wheeled) Gait Pattern/deviations: Step-to pattern General Gait Details: standing rest break required; ability to step to instead of slide Lt foot for ambulation compared to previous tx; cues for upright posture and sequecing of gait pattern with use of AD to decrease lean on walker vs using it to weight shift and offload Lt LE for advancement Gait velocity:  decreased    ADL: ADL Overall ADL's : Needs assistance/impaired Grooming: Wash/dry hands, Sitting, Set up Upper Body Bathing: Set up, Bed level Lower Body Bathing: Maximal assistance, Sit to/from stand Upper Body Dressing : Set up Lower Body Dressing: Maximal assistance, Sit to/from stand Toilet Transfer: Minimal assistance, Stand-pivot, BSC Toileting- Clothing Manipulation and Hygiene: Minimal assistance, Sit to/from stand Toileting - Clothing Manipulation Details (indicate cue type and reason): foley Functional mobility during ADLs: Minimal assistance General ADL Comments: pt. with noted improvement this session.  able to complete bsc transfer and then transfer to recliner  Cognition: Cognition Overall Cognitive Status: Within Functional Limits for tasks assessed Orientation Level: Oriented X4 Cognition Arousal/Alertness: Awake/alert Behavior During Therapy: WFL for tasks assessed/performed Overall Cognitive Status: Within Functional Limits for tasks assessed  Physical Exam: Blood pressure 118/69, pulse 104, temperature 98.3 F (36.8 C), temperature source Oral, resp. rate 16, height _0  (1.651 m), weight 74.39 kg (164 lb), last menstrual period 09/11/2012, SpO2 98 %. Physical Exam Constitutional: She is oriented to  person, place, and time. She appears well-developed.  HENT: oral mucosa pink and moist Head: Normocephalic.  Eyes: EOM are normal.  Neck: Normal range of motion. Neck supple. No thyromegaly present.  Cardiovascular: Normal rate and regular rhythm. no murmurs Respiratory: Effort normal and breath sounds normal. No respiratory distress. No wheezes or rales GI: Soft. Bowel sounds are normal. She exhibits no distension. Non-tender. Musculoskeletal:  Left leg in KI locked in extension. Right leg with scattered bruises.  Neurological: She is alert and oriented to person, place, and time.  Pt alert and appropriate. Left leg limited due to brace pain. Left foot  remains NVI.   Skin:  LLE incisions clean with sutures, intact. Minimal drainage, foam dressings Psychiatric: She has a normal mood and affect. Her behavior is normal. Thought content normal   Results for orders placed or performed during the hospital encounter of 06/14/15 (from the past 48 hour(s))  Urinalysis, Routine w reflex microscopic (not at [facility])     Status: Abnormal   Collection Time: 06/21/15  9:52 PM  Result Value Ref Range   Color, Urine YELLOW YELLOW   APPearance CLOUDY (A) CLEAR   Specific Gravity, Urine 1.016 1.005 - 1.030   pH 6.0 5.0 - 8.0   Glucose, UA NEGATIVE NEGATIVE mg/dL   Hgb urine dipstick SMALL (A) NEGATIVE   Bilirubin Urine NEGATIVE NEGATIVE   Ketones, ur NEGATIVE NEGATIVE mg/dL   Protein, ur NEGATIVE NEGATIVE mg/dL   Nitrite POSITIVE (A) NEGATIVE   Leukocytes, UA LARGE (A) NEGATIVE  Urine microscopic-add on     Status: Abnormal   Collection Time: 06/21/15  9:52 PM  Result Value Ref Range   Squamous Epithelial / LPF 0-5 (A) NONE SEEN   WBC, UA TOO NUMEROUS TO COUNT 0 - 5 WBC/hpf   RBC / HPF 6-30 0 - 5 RBC/hpf   Bacteria, UA MANY (A) NONE SEEN   Urine-Other MUCOUS PRESENT   Urine culture     Status: None (Preliminary result)   Collection Time: 06/21/15  9:53 PM  Result Value Ref Range   Specimen Description URINE, CATHETERIZED    Special Requests NONE    Culture >=100,000 COLONIES/mL GRAM NEGATIVE RODS    Report Status PENDING   Basic metabolic panel     Status: Abnormal   Collection Time: 06/22/15  4:45 AM  Result Value Ref Range   Sodium 131 (L) 135 - 145 mmol/L   Potassium 3.6 3.5 - 5.1 mmol/L   Chloride 97 (L) 101 - 111 mmol/L   CO2 25 22 - 32 mmol/L   Glucose, Bld 130 (H) 65 - 99 mg/dL   BUN 8 6 - 20 mg/dL   Creatinine, Ser 0.62 0.44 - 1.00 mg/dL   Calcium 8.6 (L) 8.9 - 10.3 mg/dL   GFR calc non Af Amer >60 >60 mL/min   GFR calc Af Amer >60 >60 mL/min    Comment: (NOTE) The eGFR has been calculated using the CKD EPI equation. This  calculation has not been validated in all clinical situations. eGFR's persistently <60 mL/min signify possible Chronic Kidney Disease.    Anion gap 9 5 - 15  CBC     Status: Abnormal   Collection Time: 06/22/15  4:45 AM  Result Value Ref Range   WBC 10.3 4.0 - 10.5 K/uL   RBC 3.40 (L) 3.87 - 5.11 MIL/uL   Hemoglobin 9.7 (L) 12.0 - 15.0 g/dL   HCT 29.3 (L) 36.0 - 46.0 %   MCV 86.2 78.0 -  100.0 fL   MCH 28.5 26.0 - 34.0 pg   MCHC 33.1 30.0 - 36.0 g/dL   RDW 14.0 11.5 - 15.5 %   Platelets 366 150 - 400 K/uL  CBC     Status: Abnormal   Collection Time: 06/23/15  3:28 AM  Result Value Ref Range   WBC 9.1 4.0 - 10.5 K/uL   RBC 3.17 (L) 3.87 - 5.11 MIL/uL   Hemoglobin 9.1 (L) 12.0 - 15.0 g/dL   HCT 27.5 (L) 36.0 - 46.0 %   MCV 86.8 78.0 - 100.0 fL   MCH 28.7 26.0 - 34.0 pg   MCHC 33.1 30.0 - 36.0 g/dL   RDW 14.1 11.5 - 15.5 %   Platelets 339 150 - 400 K/uL  Basic metabolic panel     Status: Abnormal   Collection Time: 06/23/15  3:28 AM  Result Value Ref Range   Sodium 134 (L) 135 - 145 mmol/L   Potassium 3.5 3.5 - 5.1 mmol/L   Chloride 99 (L) 101 - 111 mmol/L   CO2 27 22 - 32 mmol/L   Glucose, Bld 126 (H) 65 - 99 mg/dL   BUN 5 (L) 6 - 20 mg/dL   Creatinine, Ser 0.55 0.44 - 1.00 mg/dL   Calcium 8.8 (L) 8.9 - 10.3 mg/dL   GFR calc non Af Amer >60 >60 mL/min   GFR calc Af Amer >60 >60 mL/min    Comment: (NOTE) The eGFR has been calculated using the CKD EPI equation. This calculation has not been validated in all clinical situations. eGFR's persistently <60 mL/min signify possible Chronic Kidney Disease.    Anion gap 8 5 - 15  Vitamin B12     Status: Abnormal   Collection Time: 06/23/15  3:28 AM  Result Value Ref Range   Vitamin B-12 2551 (H) 180 - 914 pg/mL    Comment: (NOTE) This assay is not validated for testing neonatal or myeloproliferative syndrome specimens for Vitamin B12 levels.   Folate     Status: None   Collection Time: 06/23/15  3:28 AM  Result Value Ref  Range   Folate 30.4 >5.9 ng/mL  Iron and TIBC     Status: Abnormal   Collection Time: 06/23/15  3:28 AM  Result Value Ref Range   Iron 12 (L) 28 - 170 ug/dL   TIBC 237 (L) 250 - 450 ug/dL   Saturation Ratios 5 (L) 10.4 - 31.8 %   UIBC 225 ug/dL  Ferritin     Status: None   Collection Time: 06/23/15  3:28 AM  Result Value Ref Range   Ferritin 165 11 - 307 ng/mL  Reticulocytes     Status: Abnormal   Collection Time: 06/23/15  3:28 AM  Result Value Ref Range   Retic Ct Pct 2.9 0.4 - 3.1 %   RBC. 3.17 (L) 3.87 - 5.11 MIL/uL   Retic Count, Manual 91.9 19.0 - 186.0 K/uL   Dg Knee Complete 4 Views Left  06/21/2015  CLINICAL DATA:  ORIF. EXAM: DG C-ARM 61-120 MIN; LEFT KNEE - COMPLETE 4+ VIEW COMPARISON:  CT 06/15/2015 . FINDINGS: ORIF tibial plateau fracture with good anatomic alignment. Hardware intact. 3 images obtained. 1 minute 8 seconds fluoroscopy time. IMPRESSION: ORIF left tibial plateau fracture.  Hardware intact. Electronically Signed   By: Marcello Moores  Register   On: 06/21/2015 15:38   Dg Knee Left Port  06/21/2015  CLINICAL DATA:  Tibial plateau fracture. Status post internal fixation. Subsequent encounter. EXAM: PORTABLE LEFT  KNEE - 1-2 VIEW COMPARISON:  None. FINDINGS: Lateral fixation plate and screws are seen transfixing a comminuted proximal tibial fracture in anatomic alignment. Proximal fibular fracture again noted, also in near anatomic alignment. IMPRESSION: Internal fixation of proximal tibial fracture in anatomic alignment. Electronically Signed   By: Earle Gell M.D.   On: 06/21/2015 16:48   Dg C-arm 61-120 Min  06/21/2015  CLINICAL DATA:  ORIF. EXAM: DG C-ARM 61-120 MIN; LEFT KNEE - COMPLETE 4+ VIEW COMPARISON:  CT 06/15/2015 . FINDINGS: ORIF tibial plateau fracture with good anatomic alignment. Hardware intact. 3 images obtained. 1 minute 8 seconds fluoroscopy time. IMPRESSION: ORIF left tibial plateau fracture.  Hardware intact. Electronically Signed   By: Marcello Moores  Register    On: 06/21/2015 15:38       Medical Problem List and Plan: 1.  Weakness with gait deficits secondary to left tibial plateau fracture after a fall while at work. Nonweightbearing 2.  DVT Prophylaxis/Anticoagulation: Subcutaneous Lovenox. Monitor platelet counts of any signs of bleeding. Check vascular study on admit 3. Pain Management: Neurontin 900 mg daily at bedtime, Robaxin and oxycodone as needed. 4. Acute blood loss anemia. Follow-up CBC 5. Neuropsych: This patient is capable of making decisions on her own behalf. 6. Skin/Wound Care: Routine skin checks 7. Fluids/Electrolytes/Nutrition: Routine i's and o's with follow-up chemistries 8. Hypothyroidism. Cytomel/Synthroid 9. History of right breast cancer. Continue on arimidex 10.Mood/Depression. Effexor 150 mg daily. Provide emotional support 11. UTI/gram-negative rod. Continue Macrobid 7 days  Post Admission Physician Evaluation: 1. Functional deficits secondary  to left tibial plateau fx. 2. Patient is admitted to receive collaborative, interdisciplinary care between the physiatrist, rehab nursing staff, and therapy team. 3. Patient's level of medical complexity and substantial therapy needs in context of that medical necessity cannot be provided at a lesser intensity of care such as a SNF. 4. Patient has experienced substantial functional loss from his/her baseline which was documented above under the "Functional History" and "Functional Status" headings.  Judging by the patient's diagnosis, physical exam, and functional history, the patient has potential for functional progress which will result in measurable gains while on inpatient rehab.  These gains will be of substantial and practical use upon discharge  in facilitating mobility and self-care at the household level. 5. Physiatrist will provide 24 hour management of medical needs as well as oversight of the therapy plan/treatment and provide guidance as appropriate regarding the  interaction of the two. 6. 24 hour rehab nursing will assist with bladder management, bowel management, safety, skin/wound care, disease management, medication administration and pain management  and help integrate therapy concepts, techniques,education, etc. 7. PT will assess and treat for/with: Lower extremity strength, range of motion, stamina, balance, functional mobility, safety, adaptive techniques and equipment, ortho precautions.   Goals are: mod I. 8. OT will assess and treat for/with: ADL's, functional mobility, safety, upper extremity strength, adaptive techniques and equipment, ortho precautions.   Goals are: mod I. Therapy may proceed with showering this patient withy allevyn/foam dressing in place. 9. SLP will assess and treat for/with: n/a.  Goals are: n/a. 10. Case Management and Social Worker will assess and treat for psychological issues and discharge planning. 11. Team conference will be held weekly to assess progress toward goals and to determine barriers to discharge. 12. Patient will receive at least 3 hours of therapy per day at least 5 days per week. 13. ELOS: 5-6 days       14. Prognosis:  excellent     Alroy Dust  Alen Blew, MD, [place] [facility] 06/24/2015   06/23/2015

## 2015-06-23 NOTE — Consult Note (Signed)
Physical Medicine and Rehabilitation Consult Reason for Consult: Left bicondylar tibial plateau fracture/lateral meniscus tear Referring Physician: Dr. Marcelino Scot   HPI: Nancy Beard is a 59 y.o. right handed female admitted 06/15/2015. Independent prior to admission living with her husband. Two-level home bedroom on the main floor. Steps to entry. Reported fall 06/14/2015 down approximately 3 steps while at work. Patient works at [employer] [place]. She lost her footing and fell landing on her left hip. No loss of consciousness. X-rays and imaging revealed left bicondylar tibial plateau fracture. Underwent closed reduction of left bicondylar tibial plateau fracture application of spanning external fixator 06/15/2015 per Dr. Marcelino Scot and on 06/22/2015 underwent ORIF as well as repair of lateral meniscus through an arthrotomy, anterior compartment fasciotomy and removal of external fixator. Nonweightbearing left lower extremity. Subcutaneous Lovenox for DVT prophylaxis. Hospital course pain management. Physical and occupational therapy evaluations completed and ongoing. M.D. has requested physical medicine rehabilitation consult.   Review of Systems  Constitutional: Negative for chills.  HENT: Negative for hearing loss.   Eyes: Negative for blurred vision and double vision.  Respiratory: Negative for cough and shortness of breath.   Cardiovascular: Negative for chest pain, palpitations and leg swelling.  Gastrointestinal: Positive for constipation. Negative for nausea, vomiting and abdominal pain.  Genitourinary: Negative for dysuria and hematuria.  Musculoskeletal: Positive for myalgias and joint pain.       Recent fall landing on left hip  Skin: Negative for rash.  Neurological: Negative for seizures, loss of consciousness, weakness and headaches.  Psychiatric/Behavioral: Positive for depression.  All other systems reviewed and are negative.  Past Medical History  Diagnosis Date  .  Thyroid disease   . Depression   . Sprue   . Breast cancer ([place])     right  . Hypothyroidism   . DVT (deep venous thrombosis) ([place]) 2014    Right lower extremity, thigh  . Wears glasses   . Pneumonia     hx of years ago  . History of chemotherapy   . Neuropathy ([facility])     of fingers and toes  . HEMORRHOIDS, EXTERNAL 05/05/2007  . DIVERTICULOSIS, COLON 05/05/2007  . Vitamin D insufficiency 06/20/2015   Past Surgical History  Procedure Laterality Date  . Esophagogastroduodenoscopy  2007    sprue  . Colonoscopy  2006  . Cryoblation of cervix      long time ago  . Breast lumpectomy      benign lump  . Total mastectomy Bilateral 09/11/2012    Procedure: bilateral MASTECTOMY;  Surgeon: Odis Hollingshead, MD;  Location: [facility];  Service: General;  Laterality: Bilateral;  . Axillary sentinel node biopsy Right 09/11/2012    Procedure: AXILLARY SENTINEL lymph NODE  BIOPSY;  Surgeon: Odis Hollingshead, MD;  Location: [facility];  Service: General;  Laterality: Right;  right nuclear medicine injection 12:30   . Breast reconstruction with placement of tissue expander and flex hd (acellular hydrated dermis) Bilateral 09/11/2012    Procedure: BREAST RECONSTRUCTION WITH PLACEMENT OF TISSUE EXPANDER AND FLEX HD (ACELLULAR HYDRATED DERMIS) ADM;  Surgeon: Theodoro Kos, DO;  Location: [facility];  Service: Plastics;  Laterality: Bilateral;  . Incision and drainage of wound Left 09/16/2012    Procedure: Left Breast Evacuation of Hematoma;  Surgeon: Theodoro Kos, DO;  Location: [facility];  Service: Plastics;  Laterality: Left;  . Portacath placement Right 10/09/2012    Procedure: US GUIDED INSERTION PORT-A-CATH;  Surgeon: Odis Hollingshead, MD;  Location:  [facility];  Service: General;  Laterality: Right;  Right Subclavian Vein  . Removal of bilateral tissue expanders with placement of bilateral breast implants Bilateral 06/24/2013    Procedure: REMOVAL OF BILATERAL TISSUE EXPANDERS WITH PLACEMENT OF  BILATERAL BREAST IMPLANTS;  Surgeon: Theodoro Kos, DO;  Location: [place] [facility];  Service: Plastics;  Laterality: Bilateral;  . Vulvectomy N/A 07/14/2013    Procedure: WIDE LOCAL  EXCISION VULVAR;  Surgeon: Alvino Chapel, MD;  Location: [facility];  Service: Gynecology;  Laterality: N/A;  . Port-a-cath removal Right 08/07/2013    Procedure: REMOVAL PORT-A-CATH;  Surgeon: Odis Hollingshead, MD;  Location: [place];  Service: General;  Laterality: Right;  . Breast reconstruction Right 09/24/2013    Procedure: REVISION OF RIGHT BREAST RECONSTRUCTION WITH REPOSITIONING RIGHT IMPLANT, POSSIBLE EXCISION CAPSULAR CONTRACTURE AND LIPOFILLING FOR FAT GRAFTING;  Surgeon: Theodoro Kos, DO;  Location: [facility];  Service: Plastics;  Laterality: Right;  . Liposuction with lipofilling Bilateral 09/24/2013    Procedure: LIPOSUCTION WITH LIPOFILLING;  Surgeon: Theodoro Kos, DO;  Location: [facility];  Service: Plastics;  Laterality: Bilateral;  Biltalteal filling breast  . External fixation leg Left 06/15/2015    Procedure: EXTERNAL FIXATION LEG;  Surgeon: Altamese Greenfield, MD;  Location: [facility];  Service: Orthopedics;  Laterality: Left;  . Orif tibia plateau Left 06/21/2015    Procedure: OPEN REDUCTION INTERNAL FIXATION (ORIF) TIBIAL PLATEAU REMOVAL OF EXTERNAL FISTULA;  Surgeon: Altamese [place], MD;  Location: [facility];  Service: Orthopedics;  Laterality: Left;   Family History  Problem Relation Age of Onset  . Breast cancer Paternal Aunt     dx in her 31s  . Lymphoma Paternal Uncle   . Breast cancer Paternal Grandmother     dx >50  . Heart attack Paternal Grandfather   . Breast cancer Other     2 maternal great aunts with breast cancer >50  . Osteoporosis Mother     controlled with diet/exercise  . COPD Father    Social History:  reports that she has never smoked. She has never used smokeless tobacco. She reports that she does not drink alcohol  or use illicit drugs. Allergies:  Allergies  Allergen Reactions  . Ciprofloxacin Itching and Other (See Comments)    Patient states she had muscle spasms  . Dilaudid [Hydromorphone Hcl] Itching  . Gluten Meal    Medications Prior to Admission  Medication Sig Dispense Refill  . alendronate (FOSAMAX) 70 MG tablet TAKE 1 TABLET BY MOUTH EVERY WEEK (Patient taking differently: TAKE 70 MG BY MOUTH EVERY WEEK ON SUNDAYS) 12 tablet 3  . anastrozole (ARIMIDEX) 1 MG tablet TAKE 1 TABLET BY MOUTH EVERY DAY (Patient taking differently: TAKE 1 MG BY MOUTH EVERY DAY) 90 tablet 1  . B Complex-C (SUPER B COMPLEX PO) Take 1 tablet by mouth daily.    . Cholecalciferol (VITAMIN D-3 PO) Take 1 tablet by mouth daily.     [name] gabapentin (NEURONTIN) 100 MG capsule Take 3 capsules (300 mg total) by mouth 3 (three) times daily. (Patient taking differently: Take 900 mg by mouth daily. ) 837 capsule 3  . levothyroxine (SYNTHROID, LEVOTHROID) 25 MCG tablet Take 1 tablet (25 mcg total) by mouth daily. 90 tablet 3  . liothyronine (CYTOMEL) 25 MCG tablet Take 0.5 tablets (12.5 mcg total) by mouth daily. 45 tablet 3  . Multiple Vitamin (MULTIVITAMIN) tablet Take 1 tablet by mouth daily.    . prochlorperazine (  COMPAZINE) 10 MG tablet Take 1 tablet (10 mg total) by mouth every 6 (six) hours as needed for nausea or vomiting. 30 tablet 3  . venlafaxine XR (EFFEXOR-XR) 75 MG 24 hr capsule TAKE 2 CAPSULES (150 MG TOTAL) BY MOUTH DAILY WITH BREAKFAST. 180 capsule 0  . azithromycin (ZITHROMAX) 250 MG tablet Take 2 tabs on day 1, then 1 tab daily until finished (Patient not taking: Reported on 06/15/2015) 6 tablet 0  . guaiFENesin-codeine 100-10 MG/5ML syrup Take 2.5-5 mLs by mouth every 6 (six) hours as needed for cough. (Patient not taking: Reported on 06/15/2015) 180 mL 0  . XARELTO 20 MG TABS tablet TAKE 1 TABLET (20 MG TOTAL) BY MOUTH DAILY. (Patient not taking: Reported on 06/15/2015) 90 tablet 1    Home: [place] expects to be discharged to:: Private residence Living Arrangements: Spouse/significant other Available Help at Discharge: Family, Available 24 hours/day (this week) Type of Home: House Home Access: Stairs to enter [employer] of Steps: 2+1 Entrance Stairs-Rails: None Home Layout: Two level, Able to live on main level with bedroom/bathroom Bathroom Shower/Tub: Walk-in shower, Door (3 inch curb) Biochemist, clinical: Programmer, systems: Yes Home Equipment: None Additional Comments: friend has RW she can use  Functional History: Prior Function Level of Independence: Independent Comments: works Clinical biochemist Status:  Mobility: Bed Mobility Overal bed mobility: Needs Assistance Bed Mobility: Sit to Supine Supine to sit: HOB elevated, Min assist Sit to supine: Mod assist General bed mobility comments: assistance bringing LLE to EOB and cues for technique and extra time Transfers Overall transfer level: Needs assistance Equipment used: Rolling walker (2 wheeled) Transfers: Sit to/from Stand, Stand Pivot Transfers Sit to Stand: Min guard Stand pivot transfers: Min guard, Min assist General transfer comment: cues for hand placement; compliant with NWB status LLE; tendency to slide foot to turn instead of take steps due to fatigue Ambulation/Gait Ambulation/Gait assistance: Min guard Ambulation Distance (Feet): 18 Feet Assistive device: Rolling walker (2 wheeled) Gait Pattern/deviations: Step-to pattern General Gait Details: fatigued quickly; standing rest break needed; compliant with NWB status L LE without cues    ADL: ADL Overall ADL's : Needs assistance/impaired Grooming: Wash/dry hands, Sitting, Set up Upper Body Bathing: Set up, Bed level Lower Body Bathing: Maximal assistance, Sit to/from stand Upper Body Dressing : Set up Lower Body Dressing: Maximal assistance, Sit to/from stand Toilet Transfer: Minimal assistance,  Stand-pivot, BSC Toileting- Clothing Manipulation and Hygiene: Minimal assistance, Sit to/from stand Toileting - Clothing Manipulation Details (indicate cue type and reason): foley Functional mobility during ADLs: Minimal assistance General ADL Comments: pt. with noted improvement this session.  able to complete bsc transfer and then transfer to recliner  Cognition: Cognition Overall Cognitive Status: Within Functional Limits for tasks assessed Orientation Level: Oriented X4 Cognition Arousal/Alertness: Awake/alert Behavior During Therapy: WFL for tasks assessed/performed Overall Cognitive Status: Within Functional Limits for tasks assessed  Blood pressure 117/60, pulse 95, temperature 98 F (36.7 C), temperature source Oral, resp. rate 16, height 5\' 5"  (1.651 m), weight 74.39 kg (164 lb), last menstrual period 09/11/2012, SpO2 98 %. Physical Exam  Constitutional: She is oriented to person, place, and time. She appears well-developed.  HENT:  Head: Normocephalic.  Eyes: EOM are normal.  Neck: Normal range of motion. Neck supple. No thyromegaly present.  Cardiovascular: Normal rate and regular rhythm.   Respiratory: Effort normal and breath sounds normal. No respiratory distress.  GI: Soft. Bowel sounds are normal. She exhibits no distension.  Musculoskeletal:  Left leg in KI locked in extension. Right leg with scattered bruises.   Neurological: She is alert and oriented to person, place, and time.  Pt alert and appropriate. Left leg limited due to brace pain. Left foot NVI  Skin:  Left lower extremity with Cam Walker boot in place. Neurovascular sensation intact  Psychiatric: She has a normal mood and affect. Her behavior is normal. Thought content normal.    Results for orders placed or performed during the hospital encounter of 06/14/15 (from the past 24 hour(s))  CBC     Status: Abnormal   Collection Time: 06/23/15  3:28 AM  Result Value Ref Range   WBC 9.1 4.0 - 10.5 K/uL     RBC 3.17 (L) 3.87 - 5.11 MIL/uL   Hemoglobin 9.1 (L) 12.0 - 15.0 g/dL   HCT 27.5 (L) 36.0 - 46.0 %   MCV 86.8 78.0 - 100.0 fL   MCH 28.7 26.0 - 34.0 pg   MCHC 33.1 30.0 - 36.0 g/dL   RDW 14.1 11.5 - 15.5 %   Platelets 339 150 - 400 K/uL  Basic metabolic panel     Status: Abnormal   Collection Time: 06/23/15  3:28 AM  Result Value Ref Range   Sodium 134 (L) 135 - 145 mmol/L   Potassium 3.5 3.5 - 5.1 mmol/L   Chloride 99 (L) 101 - 111 mmol/L   CO2 27 22 - 32 mmol/L   Glucose, Bld 126 (H) 65 - 99 mg/dL   BUN 5 (L) 6 - 20 mg/dL   Creatinine, Ser 0.55 0.44 - 1.00 mg/dL   Calcium 8.8 (L) 8.9 - 10.3 mg/dL   GFR calc non Af Amer >60 >60 mL/min   GFR calc Af Amer >60 >60 mL/min   Anion gap 8 5 - 15  Vitamin B12     Status: Abnormal   Collection Time: 06/23/15  3:28 AM  Result Value Ref Range   Vitamin B-12 2551 (H) 180 - 914 pg/mL  Folate     Status: None   Collection Time: 06/23/15  3:28 AM  Result Value Ref Range   Folate 30.4 >5.9 ng/mL  Iron and TIBC     Status: Abnormal   Collection Time: 06/23/15  3:28 AM  Result Value Ref Range   Iron 12 (L) 28 - 170 ug/dL   TIBC 237 (L) 250 - 450 ug/dL   Saturation Ratios 5 (L) 10.4 - 31.8 %   UIBC 225 ug/dL  Ferritin     Status: None   Collection Time: 06/23/15  3:28 AM  Result Value Ref Range   Ferritin 165 11 - 307 ng/mL  Reticulocytes     Status: Abnormal   Collection Time: 06/23/15  3:28 AM  Result Value Ref Range   Retic Ct Pct 2.9 0.4 - 3.1 %   RBC. 3.17 (L) 3.87 - 5.11 MIL/uL   Retic Count, Manual 91.9 19.0 - 186.0 K/uL   Dg Knee Complete 4 Views Left  06/21/2015  CLINICAL DATA:  ORIF. EXAM: DG C-ARM 61-120 MIN; LEFT KNEE - COMPLETE 4+ VIEW COMPARISON:  CT 06/15/2015 . FINDINGS: ORIF tibial plateau fracture with good anatomic alignment. Hardware intact. 3 images obtained. 1 minute 8 seconds fluoroscopy time. IMPRESSION: ORIF left tibial plateau fracture.  Hardware intact. Electronically Signed   By: Marcello Moores  Register   On:  06/21/2015 15:38   Dg Knee Left Port  06/21/2015  CLINICAL DATA:  Tibial plateau fracture. Status post internal fixation. Subsequent encounter. EXAM:  PORTABLE LEFT KNEE - 1-2 VIEW COMPARISON:  None. FINDINGS: Lateral fixation plate and screws are seen transfixing a comminuted proximal tibial fracture in anatomic alignment. Proximal fibular fracture again noted, also in near anatomic alignment. IMPRESSION: Internal fixation of proximal tibial fracture in anatomic alignment. Electronically Signed   By: Earle Gell M.D.   On: 06/21/2015 16:48   Dg C-arm 61-120 Min  06/21/2015  CLINICAL DATA:  ORIF. EXAM: DG C-ARM 61-120 MIN; LEFT KNEE - COMPLETE 4+ VIEW COMPARISON:  CT 06/15/2015 . FINDINGS: ORIF tibial plateau fracture with good anatomic alignment. Hardware intact. 3 images obtained. 1 minute 8 seconds fluoroscopy time. IMPRESSION: ORIF left tibial plateau fracture.  Hardware intact. Electronically Signed   By: Marcello Moores  Register   On: 06/21/2015 15:38    Assessment/Plan: Diagnosis: left tibial plateau fracture after fall down stairs 1. Does the need for close, 24 hr/day medical supervision in concert with the patient's rehab needs make it unreasonable for this patient to be served in a less intensive setting? Yes 2. Co-Morbidities requiring supervision/potential complications: wound care, pain mgt 3. Due to bladder management, bowel management, safety, skin/wound care, disease management, medication administration, pain management and patient education, does the patient require 24 hr/day rehab nursing? Yes 4. Does the patient require coordinated care of a physician, rehab nurse, PT (1-2 hrs/day, 5 days/week) and OT (1-2 hrs/day, 5 days/week) to address physical and functional deficits in the context of the above medical diagnosis(es)? Yes Addressing deficits in the following areas: balance, endurance, locomotion, strength, transferring, bowel/bladder control, bathing, dressing, feeding, grooming,  toileting and psychosocial support 5. Can the patient actively participate in an intensive therapy program of at least 3 hrs of therapy per day at least 5 days per week? Yes 6. The potential for patient to make measurable gains while on inpatient rehab is excellent 7. Anticipated functional outcomes upon discharge from inpatient rehab are modified independent  with PT, modified independent with OT, n/a with SLP. 8. Estimated rehab length of stay to reach the above functional goals is: 4-6 days 9. Does the patient have adequate social supports and living environment to accommodate these discharge functional goals? Yes 10. Anticipated D/C setting: Home 11. Anticipated post D/C treatments: HH therapy and Outpatient therapy 12. Overall Rehab/Functional Prognosis: excellent  RECOMMENDATIONS: This patient's condition is appropriate for continued rehabilitative care in the following setting: CIR Patient has agreed to participate in recommended program. Yes Note that insurance prior authorization may be required for reimbursement for recommended care.  Comment: Rehab Admissions Coordinator to follow up.  Thanks,  Meredith Staggers, MD, Mellody Drown     06/23/2015

## 2015-06-23 NOTE — Progress Notes (Cosign Needed)
[facility] Progress Note  Subjective  Doing ok Pain tolerable Not much of an appetite Concerned about going home, feels its unsafe for her, husband feels similarly  She feels very weak when using walker   Review of Systems  Constitutional: Negative for fever and chills.  Respiratory: Negative for shortness of breath and wheezing.   Cardiovascular: Negative for chest pain and palpitations.  Gastrointestinal: Negative for nausea and vomiting.  Neurological: Negative for tingling, sensory change and headaches.     Objective   BP 117/60 mmHg  Pulse 95  Temp(Src) 98 F (36.7 C) (Oral)  Resp 16  Ht _0  (1.651 m)  Wt 74.39 kg (164 lb)  BMI 27.29 kg/m2  SpO2 98%  LMP 09/11/2012  Intake/Output      12/07 0701 - 12/08 0700 12/08 0701 - 12/09 0700   P.O. 840    I.V. (mL/kg)     Total Intake(mL/kg) 840 (11.3)    Urine (mL/kg/hr)     Blood     Total Output       Net +840          Urine Occurrence 2 x      Labs  Results for Nancy, Beard (MRN [id number]) as of 06/23/2015 09:26  Ref. Range 06/23/2015 03:28  Sodium Latest Ref Range: 135-145 mmol/L 134 (L)  Potassium Latest Ref Range: 3.5-5.1 mmol/L 3.5  Chloride Latest Ref Range: 101-111 mmol/L 99 (L)  CO2 Latest Ref Range: 22-32 mmol/L 27  BUN Latest Ref Range: 6-20 mg/dL 5 (L)  Creatinine Latest Ref Range: 0.44-1.00 mg/dL 0.55  Calcium Latest Ref Range: 8.9-10.3 mg/dL 8.8 (L)  EGFR (Non-African Amer.) Latest Ref Range: >60 mL/min >60  EGFR (African American) Latest Ref Range: >60 mL/min >60  Glucose Latest Ref Range: 65-99 mg/dL 126 (H)  Anion gap Latest Ref Range: 5-15  8  Iron Latest Ref Range: 28-170 ug/dL 12 (L)  UIBC Latest Units: ug/dL 225  TIBC Latest Ref Range: 250-450 ug/dL 237 (L)  Saturation Ratios Latest Ref Range: 10.4-31.8 % 5 (L)  Ferritin Latest Ref Range: 11-307 ng/mL 165  Folate Latest Ref Range: >5.9 ng/mL 30.4  Vitamin B-12 Latest Ref Range: 180-914 pg/mL 2551 (H)  WBC  Latest Ref Range: 4.0-10.5 K/uL 9.1  RBC Latest Ref Range: 3.87-5.11 MIL/uL 3.17 (L)  Hemoglobin Latest Ref Range: 12.0-15.0 g/dL 9.1 (L)  HCT Latest Ref Range: 36.0-46.0 % 27.5 (L)  MCV Latest Ref Range: 78.0-100.0 fL 86.8  MCH Latest Ref Range: 26.0-34.0 pg 28.7  MCHC Latest Ref Range: 30.0-36.0 g/dL 33.1  RDW Latest Ref Range: 11.5-15.5 % 14.1  Platelets Latest Ref Range: 150-400 K/uL 339  RBC. Latest Ref Range: 3.87-5.11 MIL/uL 3.17 (L)  Retic Ct Pct Latest Ref Range: 0.4-3.1 % 2.9  Retic Count, Manual Latest Ref Range: 19.0-186.0 K/uL 91.9    Exam  Gen: awake and alert, sitting in bedside chair, NAD Lungs: clear  Cardiac: RRR, s1 and s2 Abd: + BS, NTND Ext:       Left Lower Extremity      Dressing c/d/i             Ext warm             + DP pulse             No dct               Compartments soft             DPN,  SPN, TN sensation intact             EHL, FHL, AT, PT, peroneals, gastroc motor intact             Active ankle extension weak             Swelling stable  Hinged knee brace fitting well, adjusted to go into hyperextension   PRAFO boot fitting well    Assessment and Plan   POD/HD#: 2  59 y/o female s/p low energy fall with complex bicondylar Left tibial plateau fracture  1. Fall at work  2. Comminuted bicondylar L tibial plateau fracture s/p ORIF              NWB               Ok to get out of bed with assist             PT/OT evals             Ice               Elevate leg above heart                          Night splint to help prevent equinus contracture- ok to take off for ROM               Hinged knee brace             Unrestricted ROM L knee                         L knee ROM- AROM, PROM. Prone exercises as well, if feasible. No ROM restrictions.  Quad sets, SLR, LAQ, SAQ, heel slides, stretching, prone flexion and extension             No pillows under bend of knee when at rest, ok to place under heel to help work on extension or use zero  knee bone foam               3. Pain management:             continue with current regimen                           Use percocet as primary medication                         Oxy IR for breakthrough pain                           IV morphine for severe breakthrough pain  4. ABL anemia/Hemodynamics             Stable             Cbc in am   5. Medical issues               Hypothyroid                         Home meds                         TSH ok               UTI  U/a suggestive of UTI                         Will start Nitrofurantoin ER 184m po q12h, day 2/7                         Urine culture pending, will adjust accordingly    Elevated vitamin B 12   Pt was taking supplemental vitamin B PTA   Will dc this medication   Recheck labs thru PCP in several months   6. DVT/PE prophylaxis, hx of R leg DVT:             Lovenox x 21 days  7.ID:               periop abx  8.Metabolic Bone Disease:             Vitamin D insufficiency and mild hyperparathyroidism                         Add vitamin D supplementation                         Would like to obtain 1,25 vitamin d level but lab no longer offered at this institution- can do as outpt                            Add vitamin C and supplemental calcium   9.Activity:             Up with assistance             NWB Left leg  10. FEN/Foley/Lines:             Diet as tolerated  11. Dispo:             PT/OT             CIR consult  KJari Pigg PA-C [facility] [phone]) 06/23/2015 9:25 AM

## 2015-06-23 NOTE — Progress Notes (Signed)
Inpatient Rehabilitation  Received insurance authorization and await phone call from husband for acceptance.  Will follow up in am.    Carmelia Roller., CCC/SLP [facility]mission [place]  Cell [phone]

## 2015-06-23 NOTE — Progress Notes (Signed)
Rehab admissions - Evaluated for possible admission.  Met with patient.  She would like inpatient rehab prior to home.  Husband works long hours 7 am to 9 pm some days.  Patient and husband feel they need rehab prior to home.  I will contact workers comp Tourist information centre manager and seek authorization for acute inpatient rehab admission.  Hopeful for rehab bed to become available tomorrow.  I will follow up in am.  Call me for questions.  #[phone]

## 2015-06-24 ENCOUNTER — Inpatient Hospital Stay (HOSPITAL_COMMUNITY)
Admission: AD | Admit: 2015-06-24 | Discharge: 2015-07-01 | DRG: 563 | Disposition: A | Discharge status: Home / Self Care | Payer: Worker's Compensation | Source: Intra-hospital | Attending: Physical Medicine & Rehabilitation | Admitting: Physical Medicine & Rehabilitation

## 2015-06-24 DIAGNOSIS — S82142B Displaced bicondylar fracture of left tibia, initial encounter for open fracture type I or II: Secondary | ICD-10-CM | POA: Diagnosis present

## 2015-06-24 DIAGNOSIS — D473 Essential (hemorrhagic) thrombocythemia: Secondary | ICD-10-CM | POA: Diagnosis present

## 2015-06-24 DIAGNOSIS — I82459 Acute embolism and thrombosis of unspecified peroneal vein: Secondary | ICD-10-CM

## 2015-06-24 DIAGNOSIS — Y9289 Other specified places as the place of occurrence of the external cause: Secondary | ICD-10-CM

## 2015-06-24 DIAGNOSIS — I82499 Acute embolism and thrombosis of other specified deep vein of unspecified lower extremity: Secondary | ICD-10-CM | POA: Diagnosis not present

## 2015-06-24 DIAGNOSIS — Y990 Civilian activity done for income or pay: Secondary | ICD-10-CM

## 2015-06-24 DIAGNOSIS — R7303 Prediabetes: Secondary | ICD-10-CM | POA: Diagnosis present

## 2015-06-24 DIAGNOSIS — F32A Depression, unspecified: Secondary | ICD-10-CM | POA: Diagnosis present

## 2015-06-24 DIAGNOSIS — W19XXXD Unspecified fall, subsequent encounter: Secondary | ICD-10-CM | POA: Diagnosis present

## 2015-06-24 DIAGNOSIS — Z86718 Personal history of other venous thrombosis and embolism: Secondary | ICD-10-CM

## 2015-06-24 DIAGNOSIS — K5900 Constipation, unspecified: Secondary | ICD-10-CM | POA: Diagnosis present

## 2015-06-24 DIAGNOSIS — E611 Iron deficiency: Secondary | ICD-10-CM | POA: Diagnosis present

## 2015-06-24 DIAGNOSIS — E039 Hypothyroidism, unspecified: Secondary | ICD-10-CM | POA: Diagnosis present

## 2015-06-24 DIAGNOSIS — C50919 Malignant neoplasm of unspecified site of unspecified female breast: Secondary | ICD-10-CM | POA: Diagnosis present

## 2015-06-24 DIAGNOSIS — S82142A Displaced bicondylar fracture of left tibia, initial encounter for closed fracture: Secondary | ICD-10-CM | POA: Diagnosis present

## 2015-06-24 DIAGNOSIS — Z853 Personal history of malignant neoplasm of breast: Secondary | ICD-10-CM

## 2015-06-24 DIAGNOSIS — N390 Urinary tract infection, site not specified: Secondary | ICD-10-CM | POA: Diagnosis present

## 2015-06-24 DIAGNOSIS — I82409 Acute embolism and thrombosis of unspecified deep veins of unspecified lower extremity: Secondary | ICD-10-CM | POA: Diagnosis present

## 2015-06-24 DIAGNOSIS — E559 Vitamin D deficiency, unspecified: Secondary | ICD-10-CM | POA: Diagnosis present

## 2015-06-24 DIAGNOSIS — F329 Major depressive disorder, single episode, unspecified: Secondary | ICD-10-CM | POA: Diagnosis present

## 2015-06-24 DIAGNOSIS — A499 Bacterial infection, unspecified: Secondary | ICD-10-CM

## 2015-06-24 DIAGNOSIS — R609 Edema, unspecified: Secondary | ICD-10-CM | POA: Diagnosis not present

## 2015-06-24 DIAGNOSIS — I82432 Acute embolism and thrombosis of left popliteal vein: Secondary | ICD-10-CM | POA: Diagnosis not present

## 2015-06-24 DIAGNOSIS — D62 Acute posthemorrhagic anemia: Secondary | ICD-10-CM | POA: Diagnosis present

## 2015-06-24 DIAGNOSIS — R739 Hyperglycemia, unspecified: Secondary | ICD-10-CM | POA: Insufficient documentation

## 2015-06-24 DIAGNOSIS — K900 Celiac disease: Secondary | ICD-10-CM | POA: Diagnosis not present

## 2015-06-24 DIAGNOSIS — S82142S Displaced bicondylar fracture of left tibia, sequela: Secondary | ICD-10-CM | POA: Diagnosis not present

## 2015-06-24 DIAGNOSIS — I824Z2 Acute embolism and thrombosis of unspecified deep veins of left distal lower extremity: Secondary | ICD-10-CM | POA: Diagnosis present

## 2015-06-24 DIAGNOSIS — D75839 Thrombocytosis, unspecified: Secondary | ICD-10-CM

## 2015-06-24 DIAGNOSIS — R2689 Other abnormalities of gait and mobility: Secondary | ICD-10-CM | POA: Diagnosis not present

## 2015-06-24 DIAGNOSIS — R748 Abnormal levels of other serum enzymes: Secondary | ICD-10-CM

## 2015-06-24 LAB — URINE CULTURE: Culture: >=100000

## 2015-06-24 LAB — CBC
HCT: 26.8 % — ABNORMAL LOW (ref 36.0–46.0)
Hemoglobin: 8.9 g/dL — ABNORMAL LOW (ref 12.0–15.0)
MCH: 28.8 pg (ref 26.0–34.0)
MCHC: 33.2 g/dL (ref 30.0–36.0)
MCV: 86.7 fL (ref 78.0–100.0)
Platelets: 443 10*3/uL — ABNORMAL HIGH (ref 150–400)
RBC: 3.09 MIL/uL — ABNORMAL LOW (ref 3.87–5.11)
RDW: 14.1 % (ref 11.5–15.5)
WBC: 8 10*3/uL (ref 4.0–10.5)

## 2015-06-24 LAB — BASIC METABOLIC PANEL
Anion gap: 11 (ref 5–15)
BUN: 6 mg/dL (ref 6–20)
CO2: 25 mmol/L (ref 22–32)
Calcium: 8.7 mg/dL — ABNORMAL LOW (ref 8.9–10.3)
Chloride: 99 mmol/L — ABNORMAL LOW (ref 101–111)
Creatinine, Ser: 0.54 mg/dL (ref 0.44–1.00)
GFR calc Af Amer: >60 mL/min (ref >60)
GFR calc non Af Amer: >60 mL/min (ref >60)
Glucose, Bld: 97 mg/dL (ref 65–99)
Potassium: 4.1 mmol/L (ref 3.5–5.1)
Sodium: 135 mmol/L (ref 135–145)

## 2015-06-24 MED ORDER — LEVOTHYROXINE SODIUM 25 MCG PO TABS
25.0000 ug | ORAL_TABLET | Freq: Every day | ORAL | Status: DC
  Administered 2015-06-25 – 2015-07-01 (×7): 25 ug via ORAL
  Filled 2015-06-24 (×9): qty 1

## 2015-06-24 MED ORDER — ONDANSETRON HCL 4 MG/2ML IJ SOLN: 4.0000 mg | Freq: Four times a day (QID) | INTRAMUSCULAR | Status: DC | PRN

## 2015-06-24 MED ORDER — PANTOPRAZOLE SODIUM 40 MG PO TBEC
40.0000 mg | DELAYED_RELEASE_TABLET | Freq: Every day | ORAL | Status: DC
  Administered 2015-06-25 – 2015-07-01 (×7): 40 mg via ORAL
  Filled 2015-06-24 (×7): qty 1

## 2015-06-24 MED ORDER — ONDANSETRON HCL 4 MG PO TABS
4.0000 mg | ORAL_TABLET | Freq: Four times a day (QID) | ORAL | Status: DC | PRN
  Administered 2015-06-26 – 2015-06-29 (×8): 4 mg via ORAL
  Filled 2015-06-24 (×9): qty 1

## 2015-06-24 MED ORDER — METHOCARBAMOL 500 MG PO TABS
500.0000 mg | ORAL_TABLET | Freq: Four times a day (QID) | ORAL | Status: DC | PRN
  Administered 2015-06-26: 500 mg via ORAL
  Filled 2015-06-24 (×2): qty 1

## 2015-06-24 MED ORDER — FERROUS SULFATE 325 (65 FE) MG PO TABS
325.0000 mg | ORAL_TABLET | Freq: Three times a day (TID) | ORAL | Status: DC
  Administered 2015-06-24 – 2015-06-29 (×13): 325 mg via ORAL
  Filled 2015-06-24 (×15): qty 1

## 2015-06-24 MED ORDER — OXYCODONE HCL 5 MG PO TABS
5.0000 mg | ORAL_TABLET | ORAL | Status: DC | PRN
  Administered 2015-06-25 – 2015-06-26 (×4): 10 mg via ORAL
  Administered 2015-06-26: 5 mg via ORAL
  Administered 2015-06-27 – 2015-06-28 (×5): 10 mg via ORAL
  Administered 2015-06-28: 5 mg via ORAL
  Administered 2015-06-28 – 2015-06-30 (×3): 15 mg via ORAL
  Administered 2015-06-30 – 2015-07-01 (×4): 10 mg via ORAL
  Filled 2015-06-24 (×4): qty 2
  Filled 2015-06-24: qty 3
  Filled 2015-06-24 (×2): qty 2
  Filled 2015-06-24: qty 3
  Filled 2015-06-24 (×3): qty 2
  Filled 2015-06-24: qty 3
  Filled 2015-06-24: qty 2
  Filled 2015-06-24: qty 1
  Filled 2015-06-24 (×4): qty 2

## 2015-06-24 MED ORDER — ENOXAPARIN SODIUM 40 MG/0.4ML ~~LOC~~ SOLN: 40.0000 mg | SUBCUTANEOUS | Status: DC

## 2015-06-24 MED ORDER — LIOTHYRONINE SODIUM 25 MCG PO TABS
12.5000 ug | ORAL_TABLET | Freq: Every day | ORAL | Status: DC
  Administered 2015-06-25 – 2015-07-01 (×7): 12.5 ug via ORAL
  Filled 2015-06-24 (×8): qty 1

## 2015-06-24 MED ORDER — DOCUSATE SODIUM 100 MG PO CAPS: 100.0000 mg | ORAL_CAPSULE | Freq: Two times a day (BID) | ORAL | Status: DC

## 2015-06-24 MED ORDER — ACETAMINOPHEN 325 MG PO TABS: 650.0000 mg | ORAL_TABLET | Freq: Four times a day (QID) | ORAL | Status: DC | PRN

## 2015-06-24 MED ORDER — VENLAFAXINE HCL ER 150 MG PO CP24
150.0000 mg | ORAL_CAPSULE | Freq: Every day | ORAL | Status: DC
  Administered 2015-06-25 – 2015-07-01 (×7): 150 mg via ORAL
  Filled 2015-06-24 (×7): qty 1

## 2015-06-24 MED ORDER — ACETAMINOPHEN 650 MG RE SUPP: 650.0000 mg | Freq: Four times a day (QID) | RECTAL | Status: DC | PRN

## 2015-06-24 MED ORDER — FERROUS SULFATE 325 (65 FE) MG PO TABS
325.0000 mg | ORAL_TABLET | Freq: Three times a day (TID) | ORAL | Status: DC
  Administered 2015-06-24: 325 mg via ORAL
  Filled 2015-06-24: qty 1

## 2015-06-24 MED ORDER — GABAPENTIN 300 MG PO CAPS
900.0000 mg | ORAL_CAPSULE | Freq: Every day | ORAL | Status: DC
Start: 2015-06-24 — End: 2015-07-01
  Administered 2015-06-24 – 2015-06-30 (×7): 900 mg via ORAL
  Filled 2015-06-24 (×7): qty 3

## 2015-06-24 MED ORDER — SENNOSIDES-DOCUSATE SODIUM 8.6-50 MG PO TABS
2.0000 | ORAL_TABLET | Freq: Every day | ORAL | Status: DC
  Administered 2015-06-24: 2 via ORAL
  Filled 2015-06-24: qty 2

## 2015-06-24 MED ORDER — NITROFURANTOIN MONOHYD MACRO 100 MG PO CAPS
100.0000 mg | ORAL_CAPSULE | Freq: Two times a day (BID) | ORAL | Status: DC
  Administered 2015-06-24 – 2015-06-27 (×6): 100 mg via ORAL
  Filled 2015-06-24 (×8): qty 1

## 2015-06-24 MED ORDER — SORBITOL 70 % SOLN: 30.0000 mL | Freq: Every day | Status: DC | PRN

## 2015-06-24 MED ORDER — VITAMIN D (ERGOCALCIFEROL) 1.25 MG (50000 UNIT) PO CAPS
50000.0000 [IU] | ORAL_CAPSULE | ORAL | Status: DC
  Administered 2015-06-30: 50000 [IU] via ORAL
  Filled 2015-06-24: qty 1

## 2015-06-24 MED ORDER — CALCIUM CITRATE 950 (200 CA) MG PO TABS
600.0000 mg | ORAL_TABLET | Freq: Every day | ORAL | Status: DC
  Administered 2015-06-25 – 2015-07-01 (×7): 600 mg via ORAL
  Filled 2015-06-24 (×9): qty 3

## 2015-06-24 MED ORDER — ENOXAPARIN SODIUM 40 MG/0.4ML ~~LOC~~ SOLN
40.0000 mg | SUBCUTANEOUS | Status: DC
  Administered 2015-06-25: 40 mg via SUBCUTANEOUS
  Filled 2015-06-24: qty 0.4

## 2015-06-24 MED ORDER — ANASTROZOLE 1 MG PO TABS
1.0000 mg | ORAL_TABLET | Freq: Every day | ORAL | Status: DC
  Administered 2015-06-25 – 2015-07-01 (×7): 1 mg via ORAL
  Filled 2015-06-24 (×9): qty 1

## 2015-06-24 NOTE — Progress Notes (Signed)
Nancy Beard Rehab Admission Coordinator Signed Physical Medicine and Rehabilitation PMR Pre-admission 06/24/2015 10:45 AM  Related encounter: ED to Hosp-Admission (Current) from 06/14/2015 in [facility] Collapse All   PMR Admission Coordinator Pre-Admission Assessment  Patient: Nancy Beard is an 59 y.o., female MRN: [id number] DOB: October 09, 1955 Height: 5\' 5"  (165.1 cm) Weight: 74.39 kg (164 lb)  Insurance Information HMO: PPO: PCP: IPA: 80/20: OTHER:  PRIMARY: Generic Worker's Comp Policy#: [id number] Subscriber: Self CM Name: Nancy Beard Phone#: [phone] Fax#: [phone] Pre-Cert#: approved for 6 days Employer: [employer] Benefits: Phone #: [phone] Name: Nancy Beard (adjuster) Eff. Date: 06/14/15 date of injury Deduct: Out of Pocket Max: Life Max:  CIR: SNF:  Outpatient: Co-Pay:  Home Health: [facility] through [facility] Co-Pay:  DME: Delivered walker and possibly 3:1 to home Co-Pay:  Providers:   Emergency Contact Information Contact Information    Name Relation Home Work Mobile   Nancy Beard Spouse [phone]  [phone]     Current Medical History  Patient Admitting Diagnosis: Left tibial plateau fracture after fall down stairs  History of Present Illness: Nancy Beard is a 59 y.o. right handed female admitted 06/15/2015. Independent prior to admission living with her husband. Two-level home bedroom on the main floor. Steps to entry. Reported fall 06/14/2015 down approximately 3 steps while at work. Patient works at [employer] [place]. She lost her footing and fell landing on her left hip. No loss of consciousness. X-rays and imaging revealed  left bicondylar tibial plateau fracture. Underwent closed reduction of left bicondylar tibial plateau fracture application of spanning external fixator 06/15/2015 per Dr. Marcelino Beard and on 06/22/2015 underwent ORIF as well as repair of lateral meniscus through an arthrotomy, anterior compartment fasciotomy and removal of external fixator. Nonweightbearing left lower extremity. Subcutaneous Lovenox for DVT prophylaxis. Acute blood loss anemia 9.1 and monitored. Urine study 06/21/2015 greater than 100,000 gram-negative rods placed empirically on Macrobid with sensitivities pending. Hospital course pain management. Physical and occupational therapy evaluations completed and ongoing. M.D. has requested physical medicine rehabilitation consult. Patient was admitted for comprehensive rehabilitation program   Past Medical History  Past Medical History  Diagnosis Date  . Thyroid disease   . Depression   . Sprue   . Breast cancer ([facility])     right  . Hypothyroidism   . DVT (deep venous thrombosis) ([facility]) 2014    Right lower extremity, thigh  . Wears glasses   . Pneumonia     hx of years ago  . History of chemotherapy   . Neuropathy ([facility])     of fingers and toes  . HEMORRHOIDS, EXTERNAL 05/05/2007  . DIVERTICULOSIS, COLON 05/05/2007  . Vitamin D insufficiency 06/20/2015    Family History  family history includes Breast cancer in her other, paternal aunt, and paternal grandmother; COPD in her father; Heart attack in her paternal grandfather; Lymphoma in her paternal uncle; Osteoporosis in her mother.  Prior Rehab/Hospitalizations:  Has the patient had major surgery during 100 days prior to admission? None prior to this admission   Current Medications   Current facility-administered medications:  . acetaminophen (TYLENOL) tablet 650 mg, 650 mg, Oral, Q6H PRN, 650 mg at 06/22/15 0027 **OR** acetaminophen (TYLENOL) suppository 650 mg, 650 mg,  Rectal, Q6H PRN, Ainsley Spinner, PA-C . alum & mag hydroxide-simeth (MAALOX/MYLANTA) 200-200-20 MG/5ML suspension 30 mL, 30 mL, Oral, Q6H PRN, Ainsley Spinner, PA-C . anastrozole (ARIMIDEX) tablet 1 mg, 1 mg, Oral, Daily,  Ainsley Spinner, PA-C, 1 mg at 06/24/15 1044 . calcium citrate (CALCITRATE - dosed in mg elemental calcium) tablet 600 mg of elemental calcium, 600 mg of elemental calcium, Oral, Daily, Ainsley Spinner, PA-C, 600 mg of elemental calcium at 06/24/15 1043 . diphenhydrAMINE (BENADRYL) capsule 25 mg, 25 mg, Oral, Q8H PRN, Altamese [place], MD . docusate sodium (COLACE) capsule 100 mg, 100 mg, Oral, BID, Ainsley Spinner, PA-C, 100 mg at 06/24/15 1043 . enoxaparin (LOVENOX) injection 40 mg, 40 mg, Subcutaneous, Q24H, Ainsley Spinner, PA-C, 40 mg at 06/24/15 0825 . ferrous sulfate tablet 325 mg, 325 mg, Oral, TID WC, Ainsley Spinner, PA-C, 325 mg at 06/24/15 1044 . gabapentin (NEURONTIN) capsule 900 mg, 900 mg, Oral, QHS, Ainsley Spinner, PA-C, 900 mg at 06/23/15 2144 . levothyroxine (SYNTHROID, LEVOTHROID) tablet 25 mcg, 25 mcg, Oral, QAC breakfast, Ainsley Spinner, PA-C, 25 mcg at 06/24/15 0547 . liothyronine (CYTOMEL) tablet 12.5 mcg, 12.5 mcg, Oral, QAC breakfast, Altamese [place], MD, 12.5 mcg at 06/24/15 0826 . methocarbamol (ROBAXIN) tablet 1,000 mg, 1,000 mg, Oral, QID, Ainsley Spinner, PA-C, 1,000 mg at 06/24/15 1043 . metoCLOPramide (REGLAN) tablet 5-10 mg, 5-10 mg, Oral, Q8H PRN **OR** metoCLOPramide (REGLAN) injection 5-10 mg, 5-10 mg, Intravenous, Q8H PRN, Ainsley Spinner, PA-C . morphine 2 MG/ML injection 2-3 mg, 2-3 mg, Intravenous, Q2H PRN, Ainsley Spinner, PA-C, 2 mg at 06/22/15 2031 . nitrofurantoin (macrocrystal-monohydrate) (MACROBID) capsule 100 mg, 100 mg, Oral, Q12H, Ainsley Spinner, PA-C, 100 mg at 06/24/15 1044 . ondansetron (ZOFRAN) tablet 4 mg, 4 mg, Oral, Q6H PRN **OR** ondansetron (ZOFRAN) injection 4 mg, 4 mg, Intravenous, Q6H PRN, Ainsley Spinner, PA-C . oxyCODONE (Oxy IR/ROXICODONE) immediate release tablet 5-15 mg,  5-15 mg, Oral, Q3H PRN, Ainsley Spinner, PA-C, 10 mg at 06/24/15 0547 . oxyCODONE-acetaminophen (PERCOCET/ROXICET) 5-325 MG per tablet 1-2 tablet, 1-2 tablet, Oral, Q6H PRN, Ainsley Spinner, PA-C, 2 tablet at 06/24/15 0848 . pantoprazole (PROTONIX) EC tablet 40 mg, 40 mg, Oral, Daily, Ainsley Spinner, PA-C, 40 mg at 06/24/15 1043 . promethazine (PHENERGAN) tablet 12.5 mg, 12.5 mg, Oral, Q6H PRN, Ainsley Spinner, PA-C . venlafaxine XR [facility]) 24 hr capsule 150 mg, 150 mg, Oral, Q breakfast, Ainsley Spinner, PA-C, 150 mg at 06/24/15 0825 . Vitamin D (Ergocalciferol) (DRISDOL) capsule 50,000 Units, 50,000 Units, Oral, Q Elsie Amis, PA-C, 50,000 Units at 06/23/15 1006  Patients Current Diet: Diet gluten free Room service appropriate?: Yes; Fluid consistency:: Thin  Precautions / Restrictions Precautions Precautions: Fall Precaution Comments: Ex Fix LLE Other Brace/Splint: foot plate splint-foam heel rest in bed; Hinged Bledsoe knee brace Restrictions Weight Bearing Restrictions: Yes LLE Weight Bearing: Non weight bearing   Has the patient had 2 or more falls or a fall with injury in the past year? 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 She was fully independent and enjoyed spending time with her family.   Home Assistive Devices / Equipment Home Assistive Devices/Equipment: Eyeglasses Home Equipment: None  Prior Device Use: Indicate devices/aids used by the patient prior to current illness, exacerbation or injury? None of the above  Prior Functional Level Prior Function Level of Independence: Independent Comments: works Engineer, mining Care: Did the patient need help bathing, dressing, using the toilet or eating? Independent  Indoor Mobility: Did the patient need assistance with walking from room to room (with or without device)? Independent  Stairs: Did  the patient need assistance with internal or  external stairs (with or without device)? Independent  Functional Cognition: Did the patient need help planning regular tasks such as shopping or remembering to take medications? Independent  Current Functional Level Cognition  Overall Cognitive Status: Within Functional Limits for tasks assessed Orientation Level: Oriented X4   Extremity Assessment (includes Sensation/Coordination)  Upper Extremity Assessment: Overall WFL for tasks assessed  Lower Extremity Assessment: LLE deficits/detail LLE Deficits / Details: able to move toes and ankle  LLE: Unable to fully assess due to immobilization, Unable to fully assess due to pain    ADLs  Overall ADL's : Needs assistance/impaired Grooming: Wash/dry hands, Sitting, Set up Upper Body Bathing: Set up, Bed level Lower Body Bathing: Maximal assistance, Sit to/from stand Upper Body Dressing : Set up Lower Body Dressing: Maximal assistance, Sit to/from stand Toilet Transfer: Minimal assistance, Stand-pivot, BSC Toileting- Clothing Manipulation and Hygiene: Minimal assistance, Sit to/from stand Toileting - Clothing Manipulation Details (indicate cue type and reason): foley Functional mobility during ADLs: Minimal assistance General ADL Comments: pt. with noted improvement this session. able to complete bsc transfer and then transfer to recliner    Mobility  Overal bed mobility: Needs Assistance Bed Mobility: Sit to Supine Supine to sit: HOB elevated, Min assist Sit to supine: Mod assist General bed mobility comments: up in chair upon arrival    Transfers  Overall transfer level: Needs assistance Equipment used: Rolling walker (2 wheeled) Transfers: Sit to/from Stand, Stand Pivot Transfers Sit to Stand: Min guard, Min assist Stand pivot transfers: Min guard General transfer comment: compliant with NWB status; carry over of hand placement and technique; min A for elevation of Lt  LE to scoot to edge of chair; no assist for standing     Ambulation / Gait / Stairs / Wheelchair Mobility  Ambulation/Gait Ambulation/Gait assistance: Physicist, medical (Feet): 20 Feet Assistive device: Rolling walker (2 wheeled) Gait Pattern/deviations: Step-to pattern General Gait Details: standing rest break required; ability to step to instead of slide Lt foot for ambulation compared to previous tx; cues for upright posture and sequecing of gait pattern with use of AD to decrease lean on walker vs using it to weight shift and offload Lt LE for advancement Gait velocity: decreased    Posture / Balance Balance Overall balance assessment: Needs assistance Sitting-balance support: Feet supported Sitting balance-Leahy Scale: Good Standing balance support: Bilateral upper extremity supported, During functional activity Standing balance-Leahy Scale: Poor    Special needs/care consideration BiPAP/CPAP CPM Continuous Drip IV Dialysis Life Vest Oxygen Special Bed Trach Size Wound Vac (area) Skin: patient reports a rash on her back that she suspects is due to the sheets here Bowel mgmt:06/21/15  Bladder mgmt: Getting up to bedside commode  Diabetic mgmt: Hemoglobin A1c 6.3, patient states not a diabetic      Previous Home Environment Living Arrangements: Spouse/significant other Available Help at Discharge: Family, Available 24 hours/day (this week) Type of Home: House Home Layout: Two level, Able to live on main level with bedroom/bathroom Home Access: Stairs to enter Entrance Stairs-Rails: None Entrance Stairs-Number of Steps: 2+1 Bathroom Shower/Tub: Gaffer, Door (3 inch curb) Biochemist, clinical: Programmer, systems: Yes How Accessible: Accessible via walker Home Care Services: No Additional Comments: friend has RW she can use  Discharge Living Setting Plans for Discharge Living Setting: Patient's home Type of Home at Discharge:  House Discharge Home Layout: Two level, Able to live on main level with bedroom/bathroom Discharge Home Access: Stairs to enter Entrance Stairs-Rails: None Entrance Stairs-Number of  Steps: 2 Discharge Bathroom Shower/Tub: Walk-in shower (with threshold ) Discharge Bathroom Toilet: Standard Discharge Bathroom Accessibility: Yes How Accessible: Accessible via walker Does the patient have any problems obtaining your medications?: No  Social/Family/Support Systems Patient Roles: Spouse, Parent, Other (Comment) Jacquelynn Cree) Contact Information: Husband: Peggyann Shoals) [place], cell [phone] Anticipated Caregiver: n/a (pt with Mod I goals but discussed 4-6 days LOS with husband) Ability/Limitations of Caregiver: Husband works full time, often 7am-9am Careers adviser: Evenings only Discharge Plan Discussed with Primary Caregiver: Yes Is Caregiver In Agreement with Plan?: Yes Does Caregiver/Family have Issues with Lodging/Transportation while Pt is in Rehab?: No   Goals/Additional Needs Patient/Family Goal for Rehab: PT/OT Mod I  Expected length of stay: 4-6 days Cultural Considerations: 7th day [employer]ist  Dietary Needs: Gluten free diet with thin liquids  Equipment Needs: TBA Pt/Family Agrees to Admission and willing to participate: Yes Program Orientation Provided & Reviewed with Pt/Caregiver Including Roles & Responsibilities: Yes Additional Information Needs: n/a  Decrease burden of Care through IP rehab admission: Not anticipated   Possible need for SNF placement upon discharge: Not anticipated   Patient Condition: This patient's condition remains as documented in the consult dated 06/23/15, in which the Rehabilitation Physician determined and documented that the patient's condition is appropriate for intensive rehabilitative care in an inpatient rehabilitation facility. Will admit to inpatient rehab today.  Preadmission Screen Completed By: Nancy Beard, 06/24/2015  11:14 AM ______________________________________________________________________  Discussed status with Dr. Naaman Plummer on 06/24/15 at 62 and received telephone approval for admission today.  Admission Coordinator: Nancy Beard, time 1110/Date 06/24/15          Cosigned by: Meredith Staggers, MD at 06/24/2015 11:31 AM  Revision History     Date/Time User Provider Type Action   06/24/2015 11:31 AM Meredith Staggers, MD Physician Cosign   06/24/2015 11:14 AM Nancy Beard Rehab Admission Coordinator Sign

## 2015-06-24 NOTE — Progress Notes (Signed)
Meredith Staggers, MD Physician Signed Physical Medicine and Rehabilitation Consult Note 06/23/2015 9:24 AM  Related encounter: ED to Hosp-Admission (Current) from 06/14/2015 in [facility] All Collapse All        Physical Medicine and Rehabilitation Consult Reason for Consult: Left bicondylar tibial plateau fracture/lateral meniscus tear Referring Physician: Dr. Marcelino Scot   HPI: Nancy Beard is a 59 y.o. right handed female admitted 06/15/2015. Independent prior to admission living with her husband. Two-level home bedroom on the main floor. Steps to entry. Reported fall 06/14/2015 down approximately 3 steps while at work. Patient works at [employer] [place]. She lost her footing and fell landing on her left hip. No loss of consciousness. X-rays and imaging revealed left bicondylar tibial plateau fracture. Underwent closed reduction of left bicondylar tibial plateau fracture application of spanning external fixator 06/15/2015 per Dr. Marcelino Scot and on 06/22/2015 underwent ORIF as well as repair of lateral meniscus through an arthrotomy, anterior compartment fasciotomy and removal of external fixator. Nonweightbearing left lower extremity. Subcutaneous Lovenox for DVT prophylaxis. Hospital course pain management. Physical and occupational therapy evaluations completed and ongoing. M.D. has requested physical medicine rehabilitation consult.   Review of Systems  Constitutional: Negative for chills.  HENT: Negative for hearing loss.  Eyes: Negative for blurred vision and double vision.  Respiratory: Negative for cough and shortness of breath.  Cardiovascular: Negative for chest pain, palpitations and leg swelling.  Gastrointestinal: Positive for constipation. Negative for nausea, vomiting and abdominal pain.  Genitourinary: Negative for dysuria and hematuria.  Musculoskeletal: Positive for myalgias and joint pain.   Recent fall landing on left hip    Skin: Negative for rash.  Neurological: Negative for seizures, loss of consciousness, weakness and headaches.  Psychiatric/Behavioral: Positive for depression.  All other systems reviewed and are negative.  Past Medical History  Diagnosis Date  . Thyroid disease   . Depression   . Sprue   . Breast cancer ([facility])     right  . Hypothyroidism   . DVT (deep venous thrombosis) ([facility]) 2014    Right lower extremity, thigh  . Wears glasses   . Pneumonia     hx of years ago  . History of chemotherapy   . Neuropathy ([facility])     of fingers and toes  . HEMORRHOIDS, EXTERNAL 05/05/2007  . DIVERTICULOSIS, COLON 05/05/2007  . Vitamin D insufficiency 06/20/2015   Past Surgical History  Procedure Laterality Date  . Esophagogastroduodenoscopy  2007    sprue  . Colonoscopy  2006  . Cryoblation of cervix      long time ago  . Breast lumpectomy      benign lump  . Total mastectomy Bilateral 09/11/2012    Procedure: bilateral MASTECTOMY; Surgeon: Odis Hollingshead, MD; Location: [facility]; Service: General; Laterality: Bilateral;  . Axillary sentinel node biopsy Right 09/11/2012    Procedure: AXILLARY SENTINEL lymph NODE BIOPSY; Surgeon: Odis Hollingshead, MD; Location: [facility]; Service: General; Laterality: Right; right nuclear medicine injection 12:30   . Breast reconstruction with placement of tissue expander and flex hd (acellular hydrated dermis) Bilateral 09/11/2012    Procedure: BREAST RECONSTRUCTION WITH PLACEMENT OF TISSUE EXPANDER AND FLEX HD (ACELLULAR HYDRATED DERMIS) ADM; Surgeon: Theodoro Kos, DO; Location: [facility]; Service: Plastics; Laterality: Bilateral;  . Incision and drainage of wound Left 09/16/2012    Procedure: Left Breast Evacuation of Hematoma; Surgeon: Theodoro Kos, DO; Location: [facility]; Service: Plastics; Laterality: Left;  . Portacath  placement Right  10/09/2012    Procedure: US GUIDED INSERTION PORT-A-CATH; Surgeon: Odis Hollingshead, MD; Location: [facility]; Service: General; Laterality: Right; Right Subclavian Vein  . Removal of bilateral tissue expanders with placement of bilateral breast implants Bilateral 06/24/2013    Procedure: REMOVAL OF BILATERAL TISSUE EXPANDERS WITH PLACEMENT OF BILATERAL BREAST IMPLANTS; Surgeon: Theodoro Kos, DO; Location: [facility]; Service: Plastics; Laterality: Bilateral;  . Vulvectomy N/A 07/14/2013    Procedure: WIDE LOCAL EXCISION VULVAR; Surgeon: Alvino Chapel, MD; Location: [facility]; Service: Gynecology; Laterality: N/A;  . Port-a-cath removal Right 08/07/2013    Procedure: REMOVAL PORT-A-CATH; Surgeon: Odis Hollingshead, MD; Location: [facility]; Service: General; Laterality: Right;  . Breast reconstruction Right 09/24/2013    Procedure: REVISION OF RIGHT BREAST RECONSTRUCTION WITH REPOSITIONING RIGHT IMPLANT, POSSIBLE EXCISION CAPSULAR CONTRACTURE AND LIPOFILLING FOR FAT GRAFTING; Surgeon: Theodoro Kos, DO; Location: [facility]; Service: Plastics; Laterality: Right;  . Liposuction with lipofilling Bilateral 09/24/2013    Procedure: LIPOSUCTION WITH LIPOFILLING; Surgeon: Theodoro Kos, DO; Location: [facility]; Service: Plastics; Laterality: Bilateral; [place] filling breast  . External fixation leg Left 06/15/2015    Procedure: EXTERNAL FIXATION LEG; Surgeon: Altamese Waterflow, MD; Location: [facility]; Service: Orthopedics; Laterality: Left;  . Orif tibia plateau Left 06/21/2015    Procedure: OPEN REDUCTION INTERNAL FIXATION (ORIF) TIBIAL PLATEAU REMOVAL OF EXTERNAL FISTULA; Surgeon: Altamese [place], MD; Location: [facility]; Service: Orthopedics; Laterality: Left;   Family History  Problem Relation Age of Onset  . Breast cancer Paternal Aunt      dx in her 42s  . Lymphoma Paternal Uncle   . Breast cancer Paternal Grandmother     dx >50  . Heart attack Paternal Grandfather   . Breast cancer Other     2 maternal great aunts with breast cancer >50  . Osteoporosis Mother     controlled with diet/exercise  . COPD Father    Social History:  reports that she has never smoked. She has never used smokeless tobacco. She reports that she does not drink alcohol or use illicit drugs. Allergies:  Allergies  Allergen Reactions  . Ciprofloxacin Itching and Other (See Comments)    Patient states she had muscle spasms  . Dilaudid [Hydromorphone Hcl] Itching  . Gluten Meal    Medications Prior to Admission  Medication Sig Dispense Refill  . alendronate (FOSAMAX) 70 MG tablet TAKE 1 TABLET BY MOUTH EVERY WEEK (Patient taking differently: TAKE 70 MG BY MOUTH EVERY WEEK ON SUNDAYS) 12 tablet 3  . anastrozole (ARIMIDEX) 1 MG tablet TAKE 1 TABLET BY MOUTH EVERY DAY (Patient taking differently: TAKE 1 MG BY MOUTH EVERY DAY) 90 tablet 1  . B Complex-C (SUPER B COMPLEX PO) Take 1 tablet by mouth daily.    . Cholecalciferol (VITAMIN D-3 PO) Take 1 tablet by mouth daily.     [name] gabapentin (NEURONTIN) 100 MG capsule Take 3 capsules (300 mg total) by mouth 3 (three) times daily. (Patient taking differently: Take 900 mg by mouth daily. ) 837 capsule 3  . levothyroxine (SYNTHROID, LEVOTHROID) 25 MCG tablet Take 1 tablet (25 mcg total) by mouth daily. 90 tablet 3  . liothyronine (CYTOMEL) 25 MCG tablet Take 0.5 tablets (12.5 mcg total) by mouth daily. 45 tablet 3  . Multiple Vitamin (MULTIVITAMIN) tablet Take 1 tablet by mouth daily.    . prochlorperazine (COMPAZINE) 10 MG tablet Take 1 tablet (10 mg total) by mouth every 6 (six) hours  as needed for nausea or vomiting. 30 tablet 3  . venlafaxine XR (EFFEXOR-XR) 75 MG 24 hr capsule TAKE 2 CAPSULES (150 MG  TOTAL) BY MOUTH DAILY WITH BREAKFAST. 180 capsule 0  . azithromycin (ZITHROMAX) 250 MG tablet Take 2 tabs on day 1, then 1 tab daily until finished (Patient not taking: Reported on 06/15/2015) 6 tablet 0  . guaiFENesin-codeine 100-10 MG/5ML syrup Take 2.5-5 mLs by mouth every 6 (six) hours as needed for cough. (Patient not taking: Reported on 06/15/2015) 180 mL 0  . XARELTO 20 MG TABS tablet TAKE 1 TABLET (20 MG TOTAL) BY MOUTH DAILY. (Patient not taking: Reported on 06/15/2015) 90 tablet 1    Home: [place] expects to be discharged to:: Private residence Living Arrangements: Spouse/significant other Available Help at Discharge: Family, Available 24 hours/day (this week) Type of Home: House Home Access: Stairs to enter [employer] of Steps: 2+1 Entrance Stairs-Rails: None Home Layout: Two level, Able to live on main level with bedroom/bathroom Bathroom Shower/Tub: Walk-in shower, Door (3 inch curb) Biochemist, clinical: Programmer, systems: Yes Home Equipment: None Additional Comments: friend has RW she can use  Functional History: Prior Function Level of Independence: Independent Comments: works Clinical biochemist Status:  Mobility: Bed Mobility Overal bed mobility: Needs Assistance Bed Mobility: Sit to Supine Supine to sit: HOB elevated, Min assist Sit to supine: Mod assist General bed mobility comments: assistance bringing LLE to EOB and cues for technique and extra time Transfers Overall transfer level: Needs assistance Equipment used: Rolling walker (2 wheeled) Transfers: Sit to/from Stand, Stand Pivot Transfers Sit to Stand: Min guard Stand pivot transfers: Min guard, Min assist General transfer comment: cues for hand placement; compliant with NWB status LLE; tendency to slide foot to turn instead of take steps due to fatigue Ambulation/Gait Ambulation/Gait assistance: Min guard Ambulation Distance (Feet):  18 Feet Assistive device: Rolling walker (2 wheeled) Gait Pattern/deviations: Step-to pattern General Gait Details: fatigued quickly; standing rest break needed; compliant with NWB status L LE without cues    ADL: ADL Overall ADL's : Needs assistance/impaired Grooming: Wash/dry hands, Sitting, Set up Upper Body Bathing: Set up, Bed level Lower Body Bathing: Maximal assistance, Sit to/from stand Upper Body Dressing : Set up Lower Body Dressing: Maximal assistance, Sit to/from stand Toilet Transfer: Minimal assistance, Stand-pivot, BSC Toileting- Clothing Manipulation and Hygiene: Minimal assistance, Sit to/from stand Toileting - Clothing Manipulation Details (indicate cue type and reason): foley Functional mobility during ADLs: Minimal assistance General ADL Comments: pt. with noted improvement this session. able to complete bsc transfer and then transfer to recliner  Cognition: Cognition Overall Cognitive Status: Within Functional Limits for tasks assessed Orientation Level: Oriented X4 Cognition Arousal/Alertness: Awake/alert Behavior During Therapy: WFL for tasks assessed/performed Overall Cognitive Status: Within Functional Limits for tasks assessed  Blood pressure 117/60, pulse 95, temperature 98 F (36.7 C), temperature source Oral, resp. rate 16, height 5\' 5"  (1.651 m), weight 74.39 kg (164 lb), last menstrual period 09/11/2012, SpO2 98 %. Physical Exam  Constitutional: She is oriented to person, place, and time. She appears well-developed.  HENT:  Head: Normocephalic.  Eyes: EOM are normal.  Neck: Normal range of motion. Neck supple. No thyromegaly present.  Cardiovascular: Normal rate and regular rhythm.  Respiratory: Effort normal and breath sounds normal. No respiratory distress.  GI: Soft. Bowel sounds are normal. She exhibits no distension.  Musculoskeletal:  Left leg in KI locked in extension. Right leg with scattered bruises.  Neurological: She is alert and  oriented to person, place, and time.  Pt alert and appropriate. Left leg limited due to brace pain. Left foot NVI  Skin:  Left lower extremity with Cam Walker boot in place. Neurovascular sensation intact  Psychiatric: She has a normal mood and affect. Her behavior is normal. Thought content normal.     Lab Results Last 24 Hours    Results for orders placed or performed during the hospital encounter of 06/14/15 (from the past 24 hour(s))  CBC Status: Abnormal   Collection Time: 06/23/15 3:28 AM  Result Value Ref Range   WBC 9.1 4.0 - 10.5 K/uL   RBC 3.17 (L) 3.87 - 5.11 MIL/uL   Hemoglobin 9.1 (L) 12.0 - 15.0 g/dL   HCT 27.5 (L) 36.0 - 46.0 %   MCV 86.8 78.0 - 100.0 fL   MCH 28.7 26.0 - 34.0 pg   MCHC 33.1 30.0 - 36.0 g/dL   RDW 14.1 11.5 - 15.5 %   Platelets 339 150 - 400 K/uL  Basic metabolic panel Status: Abnormal   Collection Time: 06/23/15 3:28 AM  Result Value Ref Range   Sodium 134 (L) 135 - 145 mmol/L   Potassium 3.5 3.5 - 5.1 mmol/L   Chloride 99 (L) 101 - 111 mmol/L   CO2 27 22 - 32 mmol/L   Glucose, Bld 126 (H) 65 - 99 mg/dL   BUN 5 (L) 6 - 20 mg/dL   Creatinine, Ser 0.55 0.44 - 1.00 mg/dL   Calcium 8.8 (L) 8.9 - 10.3 mg/dL   GFR calc non Af Amer >60 >60 mL/min   GFR calc Af Amer >60 >60 mL/min   Anion gap 8 5 - 15  Vitamin B12 Status: Abnormal   Collection Time: 06/23/15 3:28 AM  Result Value Ref Range   Vitamin B-12 2551 (H) 180 - 914 pg/mL  Folate Status: None   Collection Time: 06/23/15 3:28 AM  Result Value Ref Range   Folate 30.4 >5.9 ng/mL  Iron and TIBC Status: Abnormal   Collection Time: 06/23/15 3:28 AM  Result Value Ref Range   Iron 12 (L) 28 - 170 ug/dL   TIBC 237 (L) 250 - 450 ug/dL   Saturation Ratios 5 (L) 10.4 - 31.8 %   UIBC 225 ug/dL  Ferritin Status: None   Collection  Time: 06/23/15 3:28 AM  Result Value Ref Range   Ferritin 165 11 - 307 ng/mL  Reticulocytes Status: Abnormal   Collection Time: 06/23/15 3:28 AM  Result Value Ref Range   Retic Ct Pct 2.9 0.4 - 3.1 %   RBC. 3.17 (L) 3.87 - 5.11 MIL/uL   Retic Count, Manual 91.9 19.0 - 186.0 K/uL      Imaging Results (Last 48 hours)    Dg Knee Complete 4 Views Left  06/21/2015 CLINICAL DATA: ORIF. EXAM: DG C-ARM 61-120 MIN; LEFT KNEE - COMPLETE 4+ VIEW COMPARISON: CT 06/15/2015 . FINDINGS: ORIF tibial plateau fracture with good anatomic alignment. Hardware intact. 3 images obtained. 1 minute 8 seconds fluoroscopy time. IMPRESSION: ORIF left tibial plateau fracture. Hardware intact. Electronically Signed By: Marcello Moores Register On: 06/21/2015 15:38   Dg Knee Left Port  06/21/2015 CLINICAL DATA: Tibial plateau fracture. Status post internal fixation. Subsequent encounter. EXAM: PORTABLE LEFT KNEE - 1-2 VIEW COMPARISON: None. FINDINGS: Lateral fixation plate and screws are seen transfixing a comminuted proximal tibial fracture in anatomic alignment. Proximal fibular fracture again noted, also in near anatomic alignment. IMPRESSION: Internal fixation of proximal tibial fracture in anatomic alignment. Electronically Signed  By: Earle Gell M.D. On: 06/21/2015 16:48   Dg C-arm 61-120 Min  06/21/2015 CLINICAL DATA: ORIF. EXAM: DG C-ARM 61-120 MIN; LEFT KNEE - COMPLETE 4+ VIEW COMPARISON: CT 06/15/2015 . FINDINGS: ORIF tibial plateau fracture with good anatomic alignment. Hardware intact. 3 images obtained. 1 minute 8 seconds fluoroscopy time. IMPRESSION: ORIF left tibial plateau fracture. Hardware intact. Electronically Signed By: Marcello Moores Register On: 06/21/2015 15:38     Assessment/Plan: Diagnosis: left tibial plateau fracture after fall down stairs 1. Does the need for close, 24 hr/day medical supervision in concert with the patient's rehab needs make it  unreasonable for this patient to be served in a less intensive setting? Yes 2. Co-Morbidities requiring supervision/potential complications: wound care, pain mgt 3. Due to bladder management, bowel management, safety, skin/wound care, disease management, medication administration, pain management and patient education, does the patient require 24 hr/day rehab nursing? Yes 4. Does the patient require coordinated care of a physician, rehab nurse, PT (1-2 hrs/day, 5 days/week) and OT (1-2 hrs/day, 5 days/week) to address physical and functional deficits in the context of the above medical diagnosis(es)? Yes Addressing deficits in the following areas: balance, endurance, locomotion, strength, transferring, bowel/bladder control, bathing, dressing, feeding, grooming, toileting and psychosocial support 5. Can the patient actively participate in an intensive therapy program of at least 3 hrs of therapy per day at least 5 days per week? Yes 6. The potential for patient to make measurable gains while on inpatient rehab is excellent 7. Anticipated functional outcomes upon discharge from inpatient rehab are modified independent with PT, modified independent with OT, n/a with SLP. 8. Estimated rehab length of stay to reach the above functional goals is: 4-6 days 9. Does the patient have adequate social supports and living environment to accommodate these discharge functional goals? Yes 10. Anticipated D/C setting: Home 11. Anticipated post D/C treatments: HH therapy and Outpatient therapy 12. Overall Rehab/Functional Prognosis: excellent  RECOMMENDATIONS: This patient's condition is appropriate for continued rehabilitative care in the following setting: CIR Patient has agreed to participate in recommended program. Yes Note that insurance prior authorization may be required for reimbursement for recommended care.  Comment: Rehab Admissions Coordinator to follow up.  Thanks,  Meredith Staggers, MD,  [facility]     06/23/2015       Revision History     Date/Time User Provider Type Action   06/23/2015 11:07 AM Meredith Staggers, MD Physician Sign   06/23/2015 9:44 AM Cathlyn Parsons, PA-C Physician Assistant [place]   View Details Report       Routing History     Date/Time From To Method   06/23/2015 11:07 AM Meredith Staggers, MD Meredith Staggers, MD In Basket   06/23/2015 11:07 AM Meredith Staggers, MD Marin Olp, MD In [facility]

## 2015-06-24 NOTE — PMR Pre-admission (Signed)
PMR Admission Coordinator Pre-Admission Assessment  Patient: Nancy Beard is an 59 y.o., female MRN: [id number] DOB: 03/07/1956 Height: 5\' 5"  (165.1 cm) Weight: 74.39 kg (164 lb)              Insurance Information HMO:     PPO:      PCP:      IPA:      80/20:      OTHER:  PRIMARY: Generic Worker's Comp      Policy#: [id number]      Subscriber: Self CM Name: Bernadette Hoit      Phone#: [phone]     Fax#: [phone] Pre-Cert#: approved for 6 days      Employer: [employer]  Benefits:  Phone #: [phone]     Name: Rob Bunting (adjuster) Eff. Date: 06/14/15 date of injury      Deduct:       Out of Pocket Max:       Life Max:  CIR:       SNF:  Outpatient:      Co-Pay:  Home Health: [facility] through [facility]      Co-Pay:  DME:  Delivered walker and possibly 3:1 to home    Co-Pay:  Providers:   Emergency Contact Information Contact Information    Name Relation Home Work Mobile   Facey,Mitchell Spouse [phone]  [phone]     Current Medical History  Patient Admitting Diagnosis: Left tibial plateau fracture after fall down stairs  History of Present Illness: Nancy Beard is a 59 y.o. right handed female admitted 06/15/2015. Independent prior to admission living with her husband. Two-level home bedroom on the main floor. Steps to entry. Reported fall 06/14/2015 down approximately 3 steps while at work. Patient works at [employer] [place]. She lost her footing and fell landing on her left hip. No loss of consciousness. X-rays and imaging revealed left bicondylar tibial plateau fracture. Underwent closed reduction of left bicondylar tibial plateau fracture application of spanning external fixator 06/15/2015 per Dr. Marcelino Scot and on 06/22/2015 underwent ORIF as well as repair of lateral meniscus through an arthrotomy, anterior compartment fasciotomy and removal of external fixator. Nonweightbearing left lower extremity. Subcutaneous Lovenox for DVT prophylaxis. Acute blood  loss anemia 9.1 and monitored. Urine study 06/21/2015 greater than 100,000 gram-negative rods placed empirically on Macrobid with sensitivities pending. Hospital course pain management. Physical and occupational therapy evaluations completed and ongoing. M.D. has requested physical medicine rehabilitation consult. Patient was admitted for comprehensive rehabilitation program   Past Medical History  Past Medical History  Diagnosis Date  . Thyroid disease   . Depression   . Sprue   . Breast cancer ([place])     right  . Hypothyroidism   . DVT (deep venous thrombosis) ([place]) 2014    Right lower extremity, thigh  . Wears glasses   . Pneumonia     hx of years ago  . History of chemotherapy   . Neuropathy ([facility])     of fingers and toes  . HEMORRHOIDS, EXTERNAL 05/05/2007  . DIVERTICULOSIS, COLON 05/05/2007  . Vitamin D insufficiency 06/20/2015    Family History  family history includes Breast cancer in her other, paternal aunt, and paternal grandmother; COPD in her father; Heart attack in her paternal grandfather; Lymphoma in her paternal uncle; Osteoporosis in her mother.  Prior Rehab/Hospitalizations:  Has the patient had major surgery during 100 days prior to admission? None prior to this admission   Current Medications   Current facility-administered  medications:  .  acetaminophen (TYLENOL) tablet 650 mg, 650 mg, Oral, Q6H PRN, 650 mg at 06/22/15 0027 **OR** acetaminophen (TYLENOL) suppository 650 mg, 650 mg, Rectal, Q6H PRN, Ainsley Spinner, PA-C .  alum & mag hydroxide-simeth (MAALOX/MYLANTA) 200-200-20 MG/5ML suspension 30 mL, 30 mL, Oral, Q6H PRN, Ainsley Spinner, PA-C .  anastrozole (ARIMIDEX) tablet 1 mg, 1 mg, Oral, Daily, Ainsley Spinner, PA-C, 1 mg at 06/24/15 1044 .  calcium citrate (CALCITRATE - dosed in mg elemental calcium) tablet 600 mg of elemental calcium, 600 mg of elemental calcium, Oral, Daily, Ainsley Spinner, PA-C, 600 mg of elemental calcium at 06/24/15 1043 .  diphenhydrAMINE  (BENADRYL) capsule 25 mg, 25 mg, Oral, Q8H PRN, Altamese [place], MD .  docusate sodium (COLACE) capsule 100 mg, 100 mg, Oral, BID, Ainsley Spinner, PA-C, 100 mg at 06/24/15 1043 .  enoxaparin (LOVENOX) injection 40 mg, 40 mg, Subcutaneous, Q24H, Ainsley Spinner, PA-C, 40 mg at 06/24/15 0825 .  ferrous sulfate tablet 325 mg, 325 mg, Oral, TID WC, Ainsley Spinner, PA-C, 325 mg at 06/24/15 1044 .  gabapentin (NEURONTIN) capsule 900 mg, 900 mg, Oral, QHS, Ainsley Spinner, PA-C, 900 mg at 06/23/15 2144 .  levothyroxine (SYNTHROID, LEVOTHROID) tablet 25 mcg, 25 mcg, Oral, QAC breakfast, Ainsley Spinner, PA-C, 25 mcg at 06/24/15 0547 .  liothyronine (CYTOMEL) tablet 12.5 mcg, 12.5 mcg, Oral, QAC breakfast, Altamese , MD, 12.5 mcg at 06/24/15 0826 .  methocarbamol (ROBAXIN) tablet 1,000 mg, 1,000 mg, Oral, QID, Ainsley Spinner, PA-C, 1,000 mg at 06/24/15 1043 .  metoCLOPramide (REGLAN) tablet 5-10 mg, 5-10 mg, Oral, Q8H PRN **OR** metoCLOPramide (REGLAN) injection 5-10 mg, 5-10 mg, Intravenous, Q8H PRN, Ainsley Spinner, PA-C .  morphine 2 MG/ML injection 2-3 mg, 2-3 mg, Intravenous, Q2H PRN, Ainsley Spinner, PA-C, 2 mg at 06/22/15 2031 .  nitrofurantoin (macrocrystal-monohydrate) (MACROBID) capsule 100 mg, 100 mg, Oral, Q12H, Ainsley Spinner, PA-C, 100 mg at 06/24/15 1044 .  ondansetron (ZOFRAN) tablet 4 mg, 4 mg, Oral, Q6H PRN **OR** ondansetron (ZOFRAN) injection 4 mg, 4 mg, Intravenous, Q6H PRN, Ainsley Spinner, PA-C .  oxyCODONE (Oxy IR/ROXICODONE) immediate release tablet 5-15 mg, 5-15 mg, Oral, Q3H PRN, Ainsley Spinner, PA-C, 10 mg at 06/24/15 0547 .  oxyCODONE-acetaminophen (PERCOCET/ROXICET) 5-325 MG per tablet 1-2 tablet, 1-2 tablet, Oral, Q6H PRN, Ainsley Spinner, PA-C, 2 tablet at 06/24/15 0848 .  pantoprazole (PROTONIX) EC tablet 40 mg, 40 mg, Oral, Daily, Ainsley Spinner, PA-C, 40 mg at 06/24/15 1043 .  promethazine (PHENERGAN) tablet 12.5 mg, 12.5 mg, Oral, Q6H PRN, Ainsley Spinner, PA-C .  venlafaxine XR [facility]) 24 hr capsule 150 mg, 150 mg, Oral, Q  breakfast, Ainsley Spinner, PA-C, 150 mg at 06/24/15 0825 .  Vitamin D (Ergocalciferol) (DRISDOL) capsule 50,000 Units, 50,000 Units, Oral, Q Elsie Amis, PA-C, 50,000 Units at 06/23/15 1006  Patients Current Diet: Diet gluten free Room service appropriate?: Yes; Fluid consistency:: Thin  Precautions / Restrictions Precautions Precautions: Fall Precaution Comments: Ex Fix LLE Other Brace/Splint: foot plate splint-foam heel rest in bed; Hinged Bledsoe knee brace Restrictions Weight Bearing Restrictions: Yes LLE Weight Bearing: Non weight bearing   Has the patient had 2 or more falls or a fall with injury in the past year? 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  She was fully independent and enjoyed spending time with her family.    Home Assistive Devices / Paramedic  Devices/Equipment: Eyeglasses Home Equipment: None  Prior Device Use: Indicate devices/aids used by the patient prior to current illness, exacerbation or injury? None of the above  Prior Functional Level Prior Function Level of Independence: Independent Comments: works Engineer, mining Care: Did the patient need help bathing, dressing, using the toilet or eating?  Independent  Indoor Mobility: Did the patient need assistance with walking from room to room (with or without device)? Independent  Stairs: Did the patient need assistance with internal or external stairs (with or without device)? Independent  Functional Cognition: Did the patient need help planning regular tasks such as shopping or remembering to take medications? Independent  Current Functional Level Cognition  Overall Cognitive Status: Within Functional Limits for tasks assessed Orientation Level: Oriented X4    Extremity Assessment (includes Sensation/Coordination)  Upper Extremity Assessment: Overall  WFL for tasks assessed  Lower Extremity Assessment: LLE deficits/detail LLE Deficits / Details: able to move toes and ankle  LLE: Unable to fully assess due to immobilization, Unable to fully assess due to pain    ADLs  Overall ADL's : Needs assistance/impaired Grooming: Wash/dry hands, Sitting, Set up Upper Body Bathing: Set up, Bed level Lower Body Bathing: Maximal assistance, Sit to/from stand Upper Body Dressing : Set up Lower Body Dressing: Maximal assistance, Sit to/from stand Toilet Transfer: Minimal assistance, Stand-pivot, BSC Toileting- Clothing Manipulation and Hygiene: Minimal assistance, Sit to/from stand Toileting - Clothing Manipulation Details (indicate cue type and reason): foley Functional mobility during ADLs: Minimal assistance General ADL Comments: pt. with noted improvement this session.  able to complete bsc transfer and then transfer to recliner    Mobility  Overal bed mobility: Needs Assistance Bed Mobility: Sit to Supine Supine to sit: HOB elevated, Min assist Sit to supine: Mod assist General bed mobility comments: up in chair upon arrival    Transfers  Overall transfer level: Needs assistance Equipment used: Rolling walker (2 wheeled) Transfers: Sit to/from Stand, Stand Pivot Transfers Sit to Stand: Min guard, Min assist Stand pivot transfers: Min guard General transfer comment: compliant with NWB status; carry over of hand placement and technique; min A for elevation of Lt LE to scoot to edge of chair; no assist for standing     Ambulation / Gait / Stairs / Wheelchair Mobility  Ambulation/Gait Ambulation/Gait assistance: Physicist, medical (Feet): 20 Feet Assistive device: Rolling walker (2 wheeled) Gait Pattern/deviations: Step-to pattern General Gait Details: standing rest break required; ability to step to instead of slide Lt foot for ambulation compared to previous tx; cues for upright posture and sequecing of gait pattern with use of  AD to decrease lean on walker vs using it to weight shift and offload Lt LE for advancement Gait velocity: decreased    Posture / Balance Balance Overall balance assessment: Needs assistance Sitting-balance support: Feet supported Sitting balance-Leahy Scale: Good Standing balance support: Bilateral upper extremity supported, During functional activity Standing balance-Leahy Scale: Poor    Special needs/care consideration BiPAP/CPAP CPM Continuous Drip IV Dialysis Life Vest Oxygen Special Bed Trach Size Wound Vac (area) Skin: patient reports a rash on her back that she suspects is due to the sheets here Bowel mgmt:06/21/15  Bladder mgmt: Getting up to bedside commode  Diabetic mgmt: Hemoglobin A1c 6.3, patient states not a diabetic      Previous Home Environment Living Arrangements: Spouse/significant other Available Help at Discharge: Family, Available 24 hours/day (this week) Type of Home: House Home Layout: Two level, Able to live on main level  with bedroom/bathroom Home Access: Stairs to enter Entrance Stairs-Rails: None Entrance Stairs-Number of Steps: 2+1 Bathroom Shower/Tub: Gaffer, Door (3 inch curb) Armed forces training and education officer: Yes How Accessible: Accessible via walker Home Care Services: No Additional Comments: friend has RW she can use  Discharge Living Setting Plans for Discharge Living Setting: Patient's home Type of Home at Discharge: House Discharge Home Layout: Two level, Able to live on main level with bedroom/bathroom Discharge Home Access: Stairs to enter Entrance Stairs-Rails: None Entrance Stairs-Number of Steps: 2 Discharge Bathroom Shower/Tub: Walk-in shower (with threshold ) Discharge Bathroom Toilet: Standard Discharge Bathroom Accessibility: Yes How Accessible: Accessible via walker Does the patient have any problems obtaining your medications?: No  Social/Family/Support Systems Patient Roles: Spouse, Parent,  Other (Comment) (Nana) Contact Information: Husband: Peggyann Shoals) [place], cell [phone] Anticipated Caregiver: n/a (pt with Mod I goals but discussed 4-6 days LOS with husband) Ability/Limitations of Caregiver: Husband works full time, often 7am-9am Careers adviser: Evenings only Discharge Plan Discussed with Primary Caregiver: Yes Is Caregiver In Agreement with Plan?: Yes Does Caregiver/Family have Issues with Lodging/Transportation while Pt is in Rehab?: No   Goals/Additional Needs Patient/Family Goal for Rehab: PT/OT Mod I  Expected length of stay: 4-6 days Cultural Considerations: 7th day [employer]  Dietary Needs: Gluten free diet with thin liquids  Equipment Needs: TBA Pt/Family Agrees to Admission and willing to participate: Yes Program Orientation Provided & Reviewed with Pt/Caregiver Including Roles  & Responsibilities: Yes Additional Information Needs: n/a  Decrease burden of Care through IP rehab admission: Not anticipated   Possible need for SNF placement upon discharge: Not anticipated   Patient Condition: This patient's condition remains as documented in the consult dated 06/23/15, in which the Rehabilitation Physician determined and documented that the patient's condition is appropriate for intensive rehabilitative care in an inpatient rehabilitation facility. Will admit to inpatient rehab today.  Preadmission Screen Completed By:  Gunnar Fusi, 06/24/2015 11:14 AM ______________________________________________________________________   Discussed status with Dr. Naaman Plummer on 06/24/15 at 15 and received telephone approval for admission today.  Admission Coordinator:  Gunnar Fusi, time 1110/Date 06/24/15

## 2015-06-24 NOTE — Progress Notes (Signed)
Patient admitted onto inpatient rehab to room 4W 08. Patient given information packet, therapy schedule and all questions answered. [place]

## 2015-06-24 NOTE — Discharge Summary (Signed)
Orthopaedic Trauma Service (OTS)  Patient ID: Nancy Beard MRN: [id number] DOB/AGE: 10-02-1955 59 y.o.  Admit date: 06/14/2015 Discharge date: 06/24/2015  Admission Diagnoses: Left tibial plateau fracture H/o breast CA Hypothyroidism Hyperlipidemia Celiac sprue Osteoporosis Depression  H/o DVT R leg    Discharge Diagnoses:  Principal Problem:   Fracture, tibial plateau Active Problems:   Hypothyroidism   Hyperlipidemia   Depression   Celiac disease/sprue   Osteoporosis   Fracture tibia/fibula   Vitamin D insufficiency   UTI (urinary tract infection)   Iron deficiency   Breast cancer ([place])   Procedures Performed:  06/15/2015- Dr. Marcelino Scot 1. Closed reduction of left bicondylar tibial plateau. 2. Application of spanning external fixator, left leg  06/21/2015- Dr. Marcelino Scot 1. Open reduction and internal fixation of left bicondylar tibial     plateau. 2. Repair of lateral meniscus through an arthrotomy. 3. Anterior compartment fasciotomy. 4. Removal of external fixator under anesthesia. 5. Curettage and debridement of ulcerative pin sites   Discharged Condition: good  Hospital Course:  Patient is a 59 year old white female who is admitted on 06/14/2015 after sustaining a ground-level fall while at work. Patient was found to have a comminuted left tibial plateau fracture. Orthopedic trauma service was consult of the morning of 06/15/2015. After her clinical appearance are concerned with the position of her leg as well as with her level of pain. Decision was made to take her to the operating room on 06/15/2015 for spanning external fixation of her left tibial plateau. Over the next several days her swelling had diminished quite a bit and we felt that it was safe to proceed with definitive fixation during this hospitalization. Patient was taken back to the operating room on 06/21/2015 for formal ORIF of her left bicondylar tibial plateau fracture.  Patient's hospital  stay was relatively uncomplicated. Primary issue was pain control. This was achieved with IV and oral pain medication. She was placed on Lovenox for DVT and PE prophylaxis. Patient does have a history of DVTs and was treated for about a year and a half xarelto and came off of it earlier this year. Patient worked well with therapy starting the day after her external fixator was placed. This continued through up until her next surgery and resume the day after her definitive procedure. There were concerns that the patient and her husband voiced in terms of discharging to home. Husband does work long hours and I feel that they were unable to safely care for the patient. As such a request for inpatient rehabilitation was made. A consult was obtained and patient was deemed to be an appropriate candidate for inpatient rehabilitation.  Due to the low energy mechanism of her fracture we also did check her vitamin D levels which were in the insufficient range as well as iron deficiency. She was started on vitamin D supplementation. Patient does also have celiac sprue which may contribute to her poor bone quality. Patient was also found to have a UTI and was started on Macrobid for 7 days as well. Patient discharged on postop day 3 to inpatient rehabilitation  Consults: rehabilitation medicine  Significant Diagnostic Studies: labs:  Results for Nancy, Beard (MRN [id number]) as of 06/24/2015 09:09  Ref. Range 06/24/2015 05:04  Sodium Latest Ref Range: 135-145 mmol/L 135  Potassium Latest Ref Range: 3.5-5.1 mmol/L 4.1  Chloride Latest Ref Range: 101-111 mmol/L 99 (L)  CO2 Latest Ref Range: 22-32 mmol/L 25  BUN Latest Ref Range: 6-20 mg/dL  6  Creatinine Latest Ref Range: 0.44-1.00 mg/dL 0.54  Calcium Latest Ref Range: 8.9-10.3 mg/dL 8.7 (L)  EGFR (Non-African Amer.) Latest Ref Range: >60 mL/min >60  EGFR (African American) Latest Ref Range: >60 mL/min >60  Glucose Latest Ref Range: 65-99 mg/dL 97  Anion gap  Latest Ref Range: 5-15  11   Results for Nancy, Beard (MRN [id number]) as of 06/24/2015 09:09  Ref. Range 06/23/2015 03:28  Iron Latest Ref Range: 28-170 ug/dL 12 (L)  UIBC Latest Units: ug/dL 225  TIBC Latest Ref Range: 250-450 ug/dL 237 (L)  Saturation Ratios Latest Ref Range: 10.4-31.8 % 5 (L)  Ferritin Latest Ref Range: 11-307 ng/mL 165  Folate Latest Ref Range: >5.9 ng/mL 30.4  Vitamin B-12 Latest Ref Range: 180-914 pg/mL 2551 (H)  WBC Latest Ref Range: 4.0-10.5 K/uL 9.1  RBC Latest Ref Range: 3.87-5.11 MIL/uL 3.17 (L)  Hemoglobin Latest Ref Range: 12.0-15.0 g/dL 9.1 (L)  HCT Latest Ref Range: 36.0-46.0 % 27.5 (L)  MCV Latest Ref Range: 78.0-100.0 fL 86.8  MCH Latest Ref Range: 26.0-34.0 pg 28.7  MCHC Latest Ref Range: 30.0-36.0 g/dL 33.1  RDW Latest Ref Range: 11.5-15.5 % 14.1  Platelets Latest Ref Range: 150-400 K/uL 339  RBC. Latest Ref Range: 3.87-5.11 MIL/uL 3.17 (L)  Retic Ct Pct Latest Ref Range: 0.4-3.1 % 2.9  Retic Count, Manual Latest Ref Range: 19.0-186.0 K/uL 91.9  Glucose Latest Ref Range: 65-99 mg/dL 126 (H)   Results for Nancy, Beard (MRN [id number]) as of 06/24/2015 09:09  Ref. Range 06/15/2015 10:27  Hemoglobin A1C Latest Ref Range: 4.8-5.6 % 6.3 (H)  Results for Nancy, Beard (MRN [id number]) as of 06/24/2015 09:09  Ref. Range 06/15/2015 10:27  TSH Latest Ref Range: 0.350-4.500 uIU/mL 4.474   Results for Nancy, Beard (MRN [id number]) as of 06/24/2015 09:09  Ref. Range 06/15/2015 10:27  Transferrin Latest Ref Range: 192-382 mg/dL 252  Vit D, 25-Hydroxy Latest Ref Range: 30.0-100.0 ng/mL 28.3 (L)   Treatments: IV hydration, antibiotics: Ancef and macrobid, analgesia: percocet, oxy IR, dilaudid, robaxin, anticoagulation: LMW heparin, therapies: PT, OT and RN and surgery: as above   Discharge Exam:   Orthopaedic Trauma Service Progress Note  Subjective  Doing much better Pain improved Excited about going to inpatient rehab   No other  complaints   Review of Systems  Constitutional: Negative for fever and chills.  Respiratory: Negative for shortness of breath and wheezing.   Gastrointestinal: Negative for nausea, vomiting and abdominal pain.  Genitourinary: Negative for dysuria.  Neurological: Negative for tingling and sensory change.     Objective   BP 118/64 mmHg  Pulse 86  Temp(Src) 97.4 F (36.3 C) (Oral)  Resp 16  Ht '5\' 5"'  (1.651 m)  Wt 74.39 kg (164 lb)  BMI 27.29 kg/m2  SpO2 99%  LMP 09/11/2012  Intake/Output       12/08 0701 - 12/09 0700 12/09 0701 - 12/10 0700    P.O. 600     Total Intake(mL/kg) 600 (8.1)     Net +600            Urine Occurrence 4 x 1 x      Labs  Results for AAILYAH, DUNBAR (MRN [id number]) as of 06/24/2015 09:09   Ref. Range  06/24/2015 05:04   Sodium  Latest Ref Range: 135-145 mmol/L  135   Potassium  Latest Ref Range: 3.5-5.1 mmol/L  4.1   Chloride  Latest Ref Range: 101-111 mmol/L  99 (L)   CO2  Latest Ref Range: 22-32 mmol/L  25   BUN  Latest Ref Range: 6-20 mg/dL  6   Creatinine  Latest Ref Range: 0.44-1.00 mg/dL  0.54   Calcium  Latest Ref Range: 8.9-10.3 mg/dL  8.7 (L)   EGFR (Non-African Amer.)  Latest Ref Range: >60 mL/min  >60   EGFR (African American)  Latest Ref Range: >60 mL/min  >60   Glucose  Latest Ref Range: 65-99 mg/dL  97   Anion gap  Latest Ref Range: 5-15   11     Exam  Gen: awake and alert, resting comfortably in bed, NAD Lungs: clear   Cardiac: RRR, s1 and s2 Abd: + BS, NTND Ext:        Left Lower Extremity               Dressing c/d/i             Dressing changed             Incision stable             Mild erythema around soft tissues             No obvious drainage or purulence             All ex fix pinsites look great             Ext warm             + DP pulse             No dct               Compartments soft             DPN, SPN, TN sensation intact             EHL, FHL, AT, PT, peroneals, gastroc motor intact              Active ankle extension weak             Swelling stable             Hinged knee brace fitting well, hyperextension to full flexion             pt using zero knee bone foam this am    Assessment and Plan    POD/HD#:3  59 y/o female s/p low energy fall with complex bicondylar Left tibial plateau fracture  1. Fall at work  2. Comminuted bicondylar L tibial plateau fracture s/p ORIF              NWB               Ok to get out of bed with assist             PT/OT evals             Ice               Elevate leg above heart                          Night splint to help prevent equinus contracture- ok to take off for ROM               Ok to use zero knee when in bed and take off PRAFO  theraband ankle exercises ok at this time                Hinged knee brace             Unrestricted ROM L knee                         L knee ROM- AROM, PROM. Prone exercises as well, if feasible. No ROM restrictions.  Quad sets, SLR, LAQ, SAQ, heel slides, stretching, prone flexion and extension             No pillows under bend of knee when at rest, ok to place under heel to help work on extension or use zero knee bone foam               Monitor soft tissue              OK TO SHOWER AND [place] SURGICAL WOUNDS WITH SOAP AND WATER. Apply dry dressing afterwards               3. Pain management:             continue with current regimen                           Use percocet as primary medication                         Oxy IR for breakthrough pain                           IV morphine for severe breakthrough pain  4. ABL anemia/Hemodynamics             Stable               5. Medical issues               Hypothyroid                         Home meds                         TSH ok               UTI                                                  Nitrofurantoin ER 149m po q12h, day 3/7                                     >10^5 of E. Coli                                        60k gram + cocci  Sensitivities pending                Elevated vitamin B 12                         Pt was taking supplemental vitamin B PTA                         Will dc this medication                         Recheck labs thru PCP in several months   6. DVT/PE prophylaxis, hx of R leg DVT:             Lovenox x 21 days  7.ID:               periop abx  8.Metabolic Bone Disease:             Vitamin D insufficiency and mild hyperparathyroidism                         Add vitamin D supplementation                         Would like to obtain 1,25 vitamin d level but lab no longer offered at this institution- can do as outpt                            Add vitamin C and supplemental calcium   9.Activity:             Up with assistance             NWB Left leg  10. FEN/Foley/Lines:             Diet as tolerated  11. Dispo:             PT/OT             CIR today             Follow up with ortho 7-10 days after dc from CIR     Disposition: Inpatient Rehab     Medication List    STOP taking these medications        alendronate 70 MG tablet  Commonly known as:  FOSAMAX     azithromycin 250 MG tablet  Commonly known as:  ZITHROMAX     guaiFENesin-codeine 100-10 MG/5ML syrup     SUPER B COMPLEX PO     XARELTO 20 MG Tabs tablet  Generic drug:  rivaroxaban      TAKE these medications        anastrozole 1 MG tablet  Commonly known as:  ARIMIDEX  TAKE 1 TABLET BY MOUTH EVERY DAY     gabapentin 100 MG capsule  Commonly known as:  NEURONTIN  Take 3 capsules (300 mg total) by mouth 3 (three) times daily.     levothyroxine 25 MCG tablet  Commonly known as:  SYNTHROID, LEVOTHROID  Take 1 tablet (25 mcg total) by mouth daily.     liothyronine 25 MCG tablet  Commonly known as:  CYTOMEL  Take 0.5 tablets (12.5 mcg total) by mouth daily.     multivitamin tablet  Take 1 tablet by mouth daily.      prochlorperazine 10 MG tablet  Commonly known  as:  COMPAZINE  Take 1 tablet (10 mg total) by mouth every 6 (six) hours as needed for nausea or vomiting.     venlafaxine XR 75 MG 24 hr capsule  Commonly known as:  EFFEXOR-XR  TAKE 2 CAPSULES (150 MG TOTAL) BY MOUTH DAILY WITH BREAKFAST.     VITAMIN D-3 PO  Take 1 tablet by mouth daily.       Follow-up Information    Follow up with HANDY,MICHAEL H, MD. Schedule an appointment as soon as possible for a visit in 7 days.   Specialty:  Orthopedic Surgery   Why:  For wound re-check, For suture removal   Contact information:   [address] [place] [address] [phone]       Discharge Instructions and Plan: 59 y/o female s/p low energy fall with complex bicondylar Left tibial plateau fracture  1. Fall at work  2. Comminuted bicondylar L tibial plateau fracture s/p ORIF              NWB               Ok to get out of bed with assist             PT/OT evals             Ice               Elevate leg above heart                          Night splint to help prevent equinus contracture- ok to take off for ROM               Ok to use zero knee when in bed and take off PRAFO                         theraband ankle exercises ok at this time                Hinged knee brace             Unrestricted ROM L knee                         L knee ROM- AROM, PROM. Prone exercises as well, if feasible. No ROM restrictions.  Quad sets, SLR, LAQ, SAQ, heel slides, stretching, prone flexion and extension             No pillows under bend of knee when at rest, ok to place under heel to help work on extension or use zero knee bone foam               Monitor soft tissue              OK TO SHOWER AND [place] SURGICAL WOUNDS WITH SOAP AND WATER. Apply dry dressing afterwards               3. Pain management:             continue with current regimen                           Use percocet as primary medication                          Oxy IR  for breakthrough pain                           IV morphine for severe breakthrough pain  4. ABL anemia/Hemodynamics             Stable               5. Medical issues               Hypothyroid                         Home meds                         TSH ok               UTI                                                 Nitrofurantoin ER 165m po q12h, day 3/7                                     >10^5 of E. Coli                                       60k gram + cocci                                                              Sensitivities pending                Elevated vitamin B 12                         Pt was taking supplemental vitamin B PTA                         Will dc this medication                         Recheck labs thru PCP in several months   6. DVT/PE prophylaxis, hx of R leg DVT:             Lovenox x 21 days from most recent surgery   7.ID:               periop abx  8.Metabolic Bone Disease:             Vitamin D insufficiency and mild hyperparathyroidism                         Add vitamin D supplementation                         Would like to obtain 1,25 vitamin d level but lab no longer offered at this institution- can  do as outpt                            Add vitamin C and supplemental calcium   9.Activity:             Up with assistance             NWB Left leg  10. FEN/Foley/Lines:             Diet as tolerated  11. Dispo:             PT/OT             CIR today             Follow up with ortho 7-10 days after dc from CIR    Signed:  Jari Pigg, PA-C [facility] [phone] (P) 06/24/2015, 9:29 AM

## 2015-06-24 NOTE — Discharge Instructions (Signed)
Orthopaedic Trauma Service Discharge Instructions   General Discharge Instructions  WEIGHT BEARING STATUS: Nonweightbearing Left leg  RANGE OF MOTION/ACTIVITY: unrestricted range of motion Left knee.   Wound Care: dressing changes as needed, see instructions below   Discharge Wound Care Instructions  Do NOT apply any ointments, solutions or lotions to pin sites or surgical wounds.  These prevent needed drainage and even though solutions like hydrogen peroxide kill bacteria, they also damage cells lining the pin sites that help fight infection.  Applying lotions or ointments can keep the wounds moist and can cause them to breakdown and open up as well. This can increase the risk for infection. When in doubt call the office.  Surgical incisions should be dressed daily.  If any drainage is noted, use one layer of adaptic, then gauze, Kerlix, and an ace wrap.  Once the incision is completely dry and without drainage, it may be left open to air out.  Showering may begin 36-48 hours later.  Cleaning gently with soap and water.  Traumatic wounds should be dressed daily as well.    One layer of adaptic, gauze, Kerlix, then ace wrap.  The adaptic can be discontinued once the draining has ceased    If you have a wet to dry dressing: wet the gauze with saline the squeeze as much saline out so the gauze is moist (not soaking wet), place moistened gauze over wound, then place a dry gauze over the moist one, followed by Kerlix wrap, then ace wrap.    PAIN MEDICATION USE AND EXPECTATIONS  You have likely been given narcotic medications to help control your pain.  After a traumatic event that results in an fracture (broken bone) with or without surgery, it is ok to use narcotic pain medications to help control one's pain.  We understand that everyone responds to pain differently and each individual patient will be evaluated on a regular basis for the continued need for narcotic medications. Ideally,  narcotic medication use should last no more than 6-8 weeks (coinciding with fracture healing).   As a patient it is your responsibility as well to monitor narcotic medication use and report the amount and frequency you use these medications when you come to your office visit.   We would also advise that if you are using narcotic medications, you should take a dose prior to therapy to maximize you participation.  IF YOU ARE ON NARCOTIC MEDICATIONS IT IS NOT PERMISSIBLE TO OPERATE A MOTOR VEHICLE (MOTORCYCLE/CAR/TRUCK/MOPED) OR HEAVY MACHINERY DO NOT MIX NARCOTICS WITH OTHER CNS (CENTRAL NERVOUS SYSTEM) DEPRESSANTS SUCH AS ALCOHOL  Diet: as you were eating previously.  Can use over the counter stool softeners and bowel preparations, such as Miralax, to help with bowel movements.  Narcotics can be constipating.  Be sure to drink plenty of fluids    STOP SMOKING OR USING NICOTINE PRODUCTS!!!!  As discussed nicotine severely impairs your body's ability to heal surgical and traumatic wounds but also impairs bone healing.  Wounds and bone heal by forming microscopic blood vessels (angiogenesis) and nicotine is a vasoconstrictor (essentially, shrinks blood vessels).  Therefore, if vasoconstriction occurs to these microscopic blood vessels they essentially disappear and are unable to deliver necessary nutrients to the healing tissue.  This is one modifiable factor that you can do to dramatically increase your chances of healing your injury.    (This means no smoking, no nicotine gum, patches, etc)  DO NOT USE NONSTEROIDAL ANTI-INFLAMMATORY DRUGS (NSAID'S)  Using products such as  Advil (ibuprofen), Aleve (naproxen), Motrin (ibuprofen) for additional pain control during fracture healing can delay and/or prevent the healing response.  If you would like to take over the counter (OTC) medication, Tylenol (acetaminophen) is ok.  However, some narcotic medications that are given for pain control contain  acetaminophen as well. Therefore, you should not exceed more than 4000 mg of tylenol in a day if you do not have liver disease.  Also note that there are may OTC medicines, such as cold medicines and allergy medicines that my contain tylenol as well.  If you have any questions about medications and/or interactions please ask your doctor/PA or your pharmacist.      ICE AND ELEVATE INJURED/OPERATIVE EXTREMITY  Using ice and elevating the injured extremity above your heart can help with swelling and pain control.  Icing in a pulsatile fashion, such as 20 minutes on and 20 minutes off, can be followed.    Do not place ice directly on skin. Make sure there is a barrier between to skin and the ice pack.    Using frozen items such as frozen peas works well as the conform nicely to the are that needs to be iced.  USE AN ACE WRAP OR TED HOSE FOR SWELLING CONTROL  In addition to icing and elevation, Ace wraps or TED hose are used to help limit and resolve swelling.  It is recommended to use Ace wraps or TED hose until you are informed to stop.    When using Ace Wraps start the wrapping distally (farthest away from the body) and wrap proximally (closer to the body)   Example: If you had surgery on your leg or thing and you do not have a splint on, start the ace wrap at the toes and work your way up to the thigh        If you had surgery on your upper extremity and do not have a splint on, start the ace wrap at your fingers and work your way up to the upper arm  IF YOU ARE IN A SPLINT OR CAST DO NOT [place]   If your splint gets wet for any reason please contact the office immediately. You may shower in your splint or cast as long as you keep it dry.  This can be done by wrapping in a cast cover or garbage back (or similar)  Do Not stick any thing down your splint or cast such as pencils, money, or hangers to try and scratch yourself with.  If you feel itchy take benadryl as prescribed on the  bottle for itching  IF YOU ARE IN A CAM BOOT (BLACK BOOT)  You may remove boot periodically. Perform daily dressing changes as noted below.  Wash the liner of the boot regularly and wear a sock when wearing the boot. It is recommended that you sleep in the boot until told otherwise  [place] OR CONCERTS: [id number]

## 2015-06-24 NOTE — H&P (View-Only) (Signed)
Physical Medicine and Rehabilitation Admission H&P    Chief Complaint  Patient presents with  . Fall  : HPI: Nancy Beard is a 59 y.o. right handed female admitted 06/15/2015. Independent prior to admission living with her husband. Two-level home bedroom on the main floor. Steps to entry. Reported fall 06/14/2015 down approximately 3 steps while at work. Patient works at [employer] [place]. She lost her footing and fell landing on her left hip. No loss of consciousness. X-rays and imaging revealed left bicondylar tibial plateau fracture. Underwent closed reduction of left bicondylar tibial plateau fracture application of spanning external fixator 06/15/2015 per Dr. Marcelino Scot and on 06/22/2015 underwent ORIF as well as repair of lateral meniscus through an arthrotomy, anterior compartment fasciotomy and removal of external fixator. Nonweightbearing left lower extremity. Subcutaneous Lovenox for DVT prophylaxis. Acute blood loss anemia 9.1 and monitored. Urine study 06/21/2015 greater than 100,000 gram-negative rods placed empirically on Macrobid with sensitivities pending. Hospital course pain management. Physical and occupational therapy evaluations completed and ongoing. M.D. has requested physical medicine rehabilitation consult. Patient was admitted for comprehensive rehabilitation program    ROS Constitutional: Negative for chills.  HENT: Negative for hearing loss.  Eyes: Negative for blurred vision and double vision.  Respiratory: Negative for cough and shortness of breath.  Cardiovascular: Negative for chest pain, palpitations and leg swelling.  Gastrointestinal: Positive for constipation. Negative for nausea, vomiting and abdominal pain.  Genitourinary: Negative for dysuria and hematuria.  Musculoskeletal: Positive for myalgias and joint pain.   Recent fall landing on left hip  Skin: Negative for rash.  Neurological: Negative for seizures, loss of consciousness, weakness and  headaches.  Psychiatric/Behavioral: Positive for depression.  All other systems reviewed and are negative    Past Medical History  Diagnosis Date  . Thyroid disease   . Depression   . Sprue   . Breast cancer ([facility])     right  . Hypothyroidism   . DVT (deep venous thrombosis) ([place]) 2014    Right lower extremity, thigh  . Wears glasses   . Pneumonia     hx of years ago  . History of chemotherapy   . Neuropathy ([facility])     of fingers and toes  . HEMORRHOIDS, EXTERNAL 05/05/2007  . DIVERTICULOSIS, COLON 05/05/2007  . Vitamin D insufficiency 06/20/2015   Past Surgical History  Procedure Laterality Date  . Esophagogastroduodenoscopy  2007    sprue  . Colonoscopy  2006  . Cryoblation of cervix      long time ago  . Breast lumpectomy      benign lump  . Total mastectomy Bilateral 09/11/2012    Procedure: bilateral MASTECTOMY;  Surgeon: Odis Hollingshead, MD;  Location: [facility];  Service: General;  Laterality: Bilateral;  . Axillary sentinel node biopsy Right 09/11/2012    Procedure: AXILLARY SENTINEL lymph NODE  BIOPSY;  Surgeon: Odis Hollingshead, MD;  Location: [facility];  Service: General;  Laterality: Right;  right nuclear medicine injection 12:30   . Breast reconstruction with placement of tissue expander and flex hd (acellular hydrated dermis) Bilateral 09/11/2012    Procedure: BREAST RECONSTRUCTION WITH PLACEMENT OF TISSUE EXPANDER AND FLEX HD (ACELLULAR HYDRATED DERMIS) ADM;  Surgeon: Theodoro Kos, DO;  Location: [facility];  Service: Plastics;  Laterality: Bilateral;  . Incision and drainage of wound Left 09/16/2012    Procedure: Left Breast Evacuation of Hematoma;  Surgeon: Theodoro Kos, DO;  Location: [facility];  Service: Plastics;  Laterality: Left;  .  Portacath placement Right 10/09/2012    Procedure: US GUIDED INSERTION PORT-A-CATH;  Surgeon: Odis Hollingshead, MD;  Location: [facility];  Service: General;  Laterality: Right;  Right Subclavian Vein  . Removal of bilateral  tissue expanders with placement of bilateral breast implants Bilateral 06/24/2013    Procedure: REMOVAL OF BILATERAL TISSUE EXPANDERS WITH PLACEMENT OF BILATERAL BREAST IMPLANTS;  Surgeon: Theodoro Kos, DO;  Location: [facility];  Service: Plastics;  Laterality: Bilateral;  . Vulvectomy N/A 07/14/2013    Procedure: WIDE LOCAL  EXCISION VULVAR;  Surgeon: Alvino Chapel, MD;  Location: [facility];  Service: Gynecology;  Laterality: N/A;  . Port-a-cath removal Right 08/07/2013    Procedure: REMOVAL PORT-A-CATH;  Surgeon: Odis Hollingshead, MD;  Location: [facility];  Service: General;  Laterality: Right;  . Breast reconstruction Right 09/24/2013    Procedure: REVISION OF RIGHT BREAST RECONSTRUCTION WITH REPOSITIONING RIGHT IMPLANT, POSSIBLE EXCISION CAPSULAR CONTRACTURE AND LIPOFILLING FOR FAT GRAFTING;  Surgeon: Theodoro Kos, DO;  Location: [facility];  Service: Plastics;  Laterality: Right;  . Liposuction with lipofilling Bilateral 09/24/2013    Procedure: LIPOSUCTION WITH LIPOFILLING;  Surgeon: Theodoro Kos, DO;  Location: [facility];  Service: Plastics;  Laterality: Bilateral;  Biltalteal filling breast  . External fixation leg Left 06/15/2015    Procedure: EXTERNAL FIXATION LEG;  Surgeon: Altamese Antimony, MD;  Location: [place];  Service: Orthopedics;  Laterality: Left;  . Orif tibia plateau Left 06/21/2015    Procedure: OPEN REDUCTION INTERNAL FIXATION (ORIF) TIBIAL PLATEAU REMOVAL OF EXTERNAL FISTULA;  Surgeon: Altamese Jal, MD;  Location: [facility];  Service: Orthopedics;  Laterality: Left;   Family History  Problem Relation Age of Onset  . Breast cancer Paternal Aunt     dx in her 60s  . Lymphoma Paternal Uncle   . Breast cancer Paternal Grandmother     dx >50  . Heart attack Paternal Grandfather   . Breast cancer Other     2 maternal great aunts with breast cancer >50  . Osteoporosis Mother     controlled with diet/exercise  .  COPD Father    Social History:  reports that she has never smoked. She has never used smokeless tobacco. She reports that she does not drink alcohol or use illicit drugs. Allergies:  Allergies  Allergen Reactions  . Ciprofloxacin Itching and Other (See Comments)    Patient states she had muscle spasms  . Dilaudid [Hydromorphone Hcl] Itching  . Gluten Meal    Medications Prior to Admission  Medication Sig Dispense Refill  . alendronate (FOSAMAX) 70 MG tablet TAKE 1 TABLET BY MOUTH EVERY WEEK (Patient taking differently: TAKE 70 MG BY MOUTH EVERY WEEK ON SUNDAYS) 12 tablet 3  . anastrozole (ARIMIDEX) 1 MG tablet TAKE 1 TABLET BY MOUTH EVERY DAY (Patient taking differently: TAKE 1 MG BY MOUTH EVERY DAY) 90 tablet 1  . B Complex-C (SUPER B COMPLEX PO) Take 1 tablet by mouth daily.    . Cholecalciferol (VITAMIN D-3 PO) Take 1 tablet by mouth daily.     [name] gabapentin (NEURONTIN) 100 MG capsule Take 3 capsules (300 mg total) by mouth 3 (three) times daily. (Patient taking differently: Take 900 mg by mouth daily. ) 837 capsule 3  . levothyroxine (SYNTHROID, LEVOTHROID) 25 MCG tablet Take 1 tablet (25 mcg total) by mouth daily. 90 tablet 3  . liothyronine (CYTOMEL) 25 MCG tablet Take 0.5 tablets (12.5 mcg total) by mouth daily.  45 tablet 3  . Multiple Vitamin (MULTIVITAMIN) tablet Take 1 tablet by mouth daily.    . prochlorperazine (COMPAZINE) 10 MG tablet Take 1 tablet (10 mg total) by mouth every 6 (six) hours as needed for nausea or vomiting. 30 tablet 3  . venlafaxine XR (EFFEXOR-XR) 75 MG 24 hr capsule TAKE 2 CAPSULES (150 MG TOTAL) BY MOUTH DAILY WITH BREAKFAST. 180 capsule 0  . azithromycin (ZITHROMAX) 250 MG tablet Take 2 tabs on day 1, then 1 tab daily until finished (Patient not taking: Reported on 06/15/2015) 6 tablet 0  . guaiFENesin-codeine 100-10 MG/5ML syrup Take 2.5-5 mLs by mouth every 6 (six) hours as needed for cough. (Patient not taking: Reported on 06/15/2015) 180 mL 0  . XARELTO  20 MG TABS tablet TAKE 1 TABLET (20 MG TOTAL) BY MOUTH DAILY. (Patient not taking: Reported on 06/15/2015) 90 tablet 1    Home: [place] expects to be discharged to:: Private residence Living Arrangements: Spouse/significant other Available Help at Discharge: Family, Available 24 hours/day (this week) Type of Home: House Home Access: Stairs to enter [employer] of Steps: 2+1 Entrance Stairs-Rails: None Home Layout: Two level, Able to live on main level with bedroom/bathroom Bathroom Shower/Tub: Walk-in shower, Door (3 inch curb) Biochemist, clinical: Programmer, systems: Yes Home Equipment: None Additional Comments: friend has RW she can use   Functional History: Prior Function Level of Independence: Independent Comments: works Industrial/product designer Status:  Mobility: Bed Mobility Overal bed mobility: Needs Assistance Bed Mobility: Sit to Supine Supine to sit: HOB elevated, Min assist Sit to supine: Mod assist General bed mobility comments: up in chair upon arrival Transfers Overall transfer level: Needs assistance Equipment used: Rolling walker (2 wheeled) Transfers: Sit to/from Stand, Stand Pivot Transfers Sit to Stand: Min guard, Min assist Stand pivot transfers: Min guard General transfer comment: compliant with NWB status; carry over of hand placement and technique; min A for elevation of Lt LE to scoot to edge of chair; no assist for standing  Ambulation/Gait Ambulation/Gait assistance: Min guard Ambulation Distance (Feet): 20 Feet Assistive device: Rolling walker (2 wheeled) Gait Pattern/deviations: Step-to pattern General Gait Details: standing rest break required; ability to step to instead of slide Lt foot for ambulation compared to previous tx; cues for upright posture and sequecing of gait pattern with use of AD to decrease lean on walker vs using it to weight shift and offload Lt LE for advancement Gait velocity:  decreased    ADL: ADL Overall ADL's : Needs assistance/impaired Grooming: Wash/dry hands, Sitting, Set up Upper Body Bathing: Set up, Bed level Lower Body Bathing: Maximal assistance, Sit to/from stand Upper Body Dressing : Set up Lower Body Dressing: Maximal assistance, Sit to/from stand Toilet Transfer: Minimal assistance, Stand-pivot, BSC Toileting- Clothing Manipulation and Hygiene: Minimal assistance, Sit to/from stand Toileting - Clothing Manipulation Details (indicate cue type and reason): foley Functional mobility during ADLs: Minimal assistance General ADL Comments: pt. with noted improvement this session.  able to complete bsc transfer and then transfer to recliner  Cognition: Cognition Overall Cognitive Status: Within Functional Limits for tasks assessed Orientation Level: Oriented X4 Cognition Arousal/Alertness: Awake/alert Behavior During Therapy: WFL for tasks assessed/performed Overall Cognitive Status: Within Functional Limits for tasks assessed  Physical Exam: Blood pressure 118/69, pulse 104, temperature 98.3 F (36.8 C), temperature source Oral, resp. rate 16, height _0  (1.651 m), weight 74.39 kg (164 lb), last menstrual period 09/11/2012, SpO2 98 %. Physical Exam Constitutional: She is oriented to  person, place, and time. She appears well-developed.  HENT: oral mucosa pink and moist Head: Normocephalic.  Eyes: EOM are normal.  Neck: Normal range of motion. Neck supple. No thyromegaly present.  Cardiovascular: Normal rate and regular rhythm. no murmurs Respiratory: Effort normal and breath sounds normal. No respiratory distress. No wheezes or rales GI: Soft. Bowel sounds are normal. She exhibits no distension. Non-tender. Musculoskeletal:  Left leg in KI locked in extension. Right leg with scattered bruises.  Neurological: She is alert and oriented to person, place, and time.  Pt alert and appropriate. Left leg limited due to brace pain. Left foot  remains NVI.   Skin:  LLE incisions clean with sutures, intact. Minimal drainage, foam dressings Psychiatric: She has a normal mood and affect. Her behavior is normal. Thought content normal   Results for orders placed or performed during the hospital encounter of 06/14/15 (from the past 48 hour(s))  Urinalysis, Routine w reflex microscopic (not at [facility])     Status: Abnormal   Collection Time: 06/21/15  9:52 PM  Result Value Ref Range   Color, Urine YELLOW YELLOW   APPearance CLOUDY (A) CLEAR   Specific Gravity, Urine 1.016 1.005 - 1.030   pH 6.0 5.0 - 8.0   Glucose, UA NEGATIVE NEGATIVE mg/dL   Hgb urine dipstick SMALL (A) NEGATIVE   Bilirubin Urine NEGATIVE NEGATIVE   Ketones, ur NEGATIVE NEGATIVE mg/dL   Protein, ur NEGATIVE NEGATIVE mg/dL   Nitrite POSITIVE (A) NEGATIVE   Leukocytes, UA LARGE (A) NEGATIVE  Urine microscopic-add on     Status: Abnormal   Collection Time: 06/21/15  9:52 PM  Result Value Ref Range   Squamous Epithelial / LPF 0-5 (A) NONE SEEN   WBC, UA TOO NUMEROUS TO COUNT 0 - 5 WBC/hpf   RBC / HPF 6-30 0 - 5 RBC/hpf   Bacteria, UA MANY (A) NONE SEEN   Urine-Other MUCOUS PRESENT   Urine culture     Status: None (Preliminary result)   Collection Time: 06/21/15  9:53 PM  Result Value Ref Range   Specimen Description URINE, CATHETERIZED    Special Requests NONE    Culture >=100,000 COLONIES/mL GRAM NEGATIVE RODS    Report Status PENDING   Basic metabolic panel     Status: Abnormal   Collection Time: 06/22/15  4:45 AM  Result Value Ref Range   Sodium 131 (L) 135 - 145 mmol/L   Potassium 3.6 3.5 - 5.1 mmol/L   Chloride 97 (L) 101 - 111 mmol/L   CO2 25 22 - 32 mmol/L   Glucose, Bld 130 (H) 65 - 99 mg/dL   BUN 8 6 - 20 mg/dL   Creatinine, Ser 0.62 0.44 - 1.00 mg/dL   Calcium 8.6 (L) 8.9 - 10.3 mg/dL   GFR calc non Af Amer >60 >60 mL/min   GFR calc Af Amer >60 >60 mL/min    Comment: (NOTE) The eGFR has been calculated using the CKD EPI equation. This  calculation has not been validated in all clinical situations. eGFR's persistently <60 mL/min signify possible Chronic Kidney Disease.    Anion gap 9 5 - 15  CBC     Status: Abnormal   Collection Time: 06/22/15  4:45 AM  Result Value Ref Range   WBC 10.3 4.0 - 10.5 K/uL   RBC 3.40 (L) 3.87 - 5.11 MIL/uL   Hemoglobin 9.7 (L) 12.0 - 15.0 g/dL   HCT 29.3 (L) 36.0 - 46.0 %   MCV 86.2 78.0 -  100.0 fL   MCH 28.5 26.0 - 34.0 pg   MCHC 33.1 30.0 - 36.0 g/dL   RDW 14.0 11.5 - 15.5 %   Platelets 366 150 - 400 K/uL  CBC     Status: Abnormal   Collection Time: 06/23/15  3:28 AM  Result Value Ref Range   WBC 9.1 4.0 - 10.5 K/uL   RBC 3.17 (L) 3.87 - 5.11 MIL/uL   Hemoglobin 9.1 (L) 12.0 - 15.0 g/dL   HCT 27.5 (L) 36.0 - 46.0 %   MCV 86.8 78.0 - 100.0 fL   MCH 28.7 26.0 - 34.0 pg   MCHC 33.1 30.0 - 36.0 g/dL   RDW 14.1 11.5 - 15.5 %   Platelets 339 150 - 400 K/uL  Basic metabolic panel     Status: Abnormal   Collection Time: 06/23/15  3:28 AM  Result Value Ref Range   Sodium 134 (L) 135 - 145 mmol/L   Potassium 3.5 3.5 - 5.1 mmol/L   Chloride 99 (L) 101 - 111 mmol/L   CO2 27 22 - 32 mmol/L   Glucose, Bld 126 (H) 65 - 99 mg/dL   BUN 5 (L) 6 - 20 mg/dL   Creatinine, Ser 0.55 0.44 - 1.00 mg/dL   Calcium 8.8 (L) 8.9 - 10.3 mg/dL   GFR calc non Af Amer >60 >60 mL/min   GFR calc Af Amer >60 >60 mL/min    Comment: (NOTE) The eGFR has been calculated using the CKD EPI equation. This calculation has not been validated in all clinical situations. eGFR's persistently <60 mL/min signify possible Chronic Kidney Disease.    Anion gap 8 5 - 15  Vitamin B12     Status: Abnormal   Collection Time: 06/23/15  3:28 AM  Result Value Ref Range   Vitamin B-12 2551 (H) 180 - 914 pg/mL    Comment: (NOTE) This assay is not validated for testing neonatal or myeloproliferative syndrome specimens for Vitamin B12 levels.   Folate     Status: None   Collection Time: 06/23/15  3:28 AM  Result Value Ref  Range   Folate 30.4 >5.9 ng/mL  Iron and TIBC     Status: Abnormal   Collection Time: 06/23/15  3:28 AM  Result Value Ref Range   Iron 12 (L) 28 - 170 ug/dL   TIBC 237 (L) 250 - 450 ug/dL   Saturation Ratios 5 (L) 10.4 - 31.8 %   UIBC 225 ug/dL  Ferritin     Status: None   Collection Time: 06/23/15  3:28 AM  Result Value Ref Range   Ferritin 165 11 - 307 ng/mL  Reticulocytes     Status: Abnormal   Collection Time: 06/23/15  3:28 AM  Result Value Ref Range   Retic Ct Pct 2.9 0.4 - 3.1 %   RBC. 3.17 (L) 3.87 - 5.11 MIL/uL   Retic Count, Manual 91.9 19.0 - 186.0 K/uL   Dg Knee Complete 4 Views Left  06/21/2015  CLINICAL DATA:  ORIF. EXAM: DG C-ARM 61-120 MIN; LEFT KNEE - COMPLETE 4+ VIEW COMPARISON:  CT 06/15/2015 . FINDINGS: ORIF tibial plateau fracture with good anatomic alignment. Hardware intact. 3 images obtained. 1 minute 8 seconds fluoroscopy time. IMPRESSION: ORIF left tibial plateau fracture.  Hardware intact. Electronically Signed   By: Marcello Moores  Register   On: 06/21/2015 15:38   Dg Knee Left Port  06/21/2015  CLINICAL DATA:  Tibial plateau fracture. Status post internal fixation. Subsequent encounter. EXAM: PORTABLE LEFT  KNEE - 1-2 VIEW COMPARISON:  None. FINDINGS: Lateral fixation plate and screws are seen transfixing a comminuted proximal tibial fracture in anatomic alignment. Proximal fibular fracture again noted, also in near anatomic alignment. IMPRESSION: Internal fixation of proximal tibial fracture in anatomic alignment. Electronically Signed   By: Earle Gell M.D.   On: 06/21/2015 16:48   Dg C-arm 61-120 Min  06/21/2015  CLINICAL DATA:  ORIF. EXAM: DG C-ARM 61-120 MIN; LEFT KNEE - COMPLETE 4+ VIEW COMPARISON:  CT 06/15/2015 . FINDINGS: ORIF tibial plateau fracture with good anatomic alignment. Hardware intact. 3 images obtained. 1 minute 8 seconds fluoroscopy time. IMPRESSION: ORIF left tibial plateau fracture.  Hardware intact. Electronically Signed   By: Marcello Moores  Register    On: 06/21/2015 15:38       Medical Problem List and Plan: 1.  Weakness with gait deficits secondary to left tibial plateau fracture after a fall while at work. Nonweightbearing 2.  DVT Prophylaxis/Anticoagulation: Subcutaneous Lovenox. Monitor platelet counts of any signs of bleeding. Check vascular study on admit 3. Pain Management: Neurontin 900 mg daily at bedtime, Robaxin and oxycodone as needed. 4. Acute blood loss anemia. Follow-up CBC 5. Neuropsych: This patient is capable of making decisions on her own behalf. 6. Skin/Wound Care: Routine skin checks 7. Fluids/Electrolytes/Nutrition: Routine i's and o's with follow-up chemistries 8. Hypothyroidism. Cytomel/Synthroid 9. History of right breast cancer. Continue on arimidex 10.Mood/Depression. Effexor 150 mg daily. Provide emotional support 11. UTI/gram-negative rod. Continue Macrobid 7 days  Post Admission Physician Evaluation: 1. Functional deficits secondary  to left tibial plateau fx. 2. Patient is admitted to receive collaborative, interdisciplinary care between the physiatrist, rehab nursing staff, and therapy team. 3. Patient's level of medical complexity and substantial therapy needs in context of that medical necessity cannot be provided at a lesser intensity of care such as a SNF. 4. Patient has experienced substantial functional loss from his/her baseline which was documented above under the "Functional History" and "Functional Status" headings.  Judging by the patient's diagnosis, physical exam, and functional history, the patient has potential for functional progress which will result in measurable gains while on inpatient rehab.  These gains will be of substantial and practical use upon discharge  in facilitating mobility and self-care at the household level. 5. Physiatrist will provide 24 hour management of medical needs as well as oversight of the therapy plan/treatment and provide guidance as appropriate regarding the  interaction of the two. 6. 24 hour rehab nursing will assist with bladder management, bowel management, safety, skin/wound care, disease management, medication administration and pain management  and help integrate therapy concepts, techniques,education, etc. 7. PT will assess and treat for/with: Lower extremity strength, range of motion, stamina, balance, functional mobility, safety, adaptive techniques and equipment, ortho precautions.   Goals are: mod I. 8. OT will assess and treat for/with: ADL's, functional mobility, safety, upper extremity strength, adaptive techniques and equipment, ortho precautions.   Goals are: mod I. Therapy may proceed with showering this patient withy allevyn/foam dressing in place. 9. SLP will assess and treat for/with: n/a.  Goals are: n/a. 10. Case Management and Social Worker will assess and treat for psychological issues and discharge planning. 11. Team conference will be held weekly to assess progress toward goals and to determine barriers to discharge. 12. Patient will receive at least 3 hours of therapy per day at least 5 days per week. 13. ELOS: 5-6 days       14. Prognosis:  excellent     Alroy Dust  Alen Blew, MD, [place] [facility] 06/24/2015   06/23/2015

## 2015-06-24 NOTE — Progress Notes (Signed)
Orthopaedic Trauma Service Progress Note  Subjective  Doing much better Pain improved Excited about going to inpatient rehab  No other complaints   Review of Systems  Constitutional: Negative for fever and chills.  Respiratory: Negative for shortness of breath and wheezing.   Gastrointestinal: Negative for nausea, vomiting and abdominal pain.  Genitourinary: Negative for dysuria.  Neurological: Negative for tingling and sensory change.     Objective   BP 118/64 mmHg  Pulse 86  Temp(Src) 97.4 F (36.3 C) (Oral)  Resp 16  Ht '5\' 5"'  (1.651 m)  Wt 74.39 kg (164 lb)  BMI 27.29 kg/m2  SpO2 99%  LMP 09/11/2012  Intake/Output      12/08 0701 - 12/09 0700 12/09 0701 - 12/10 0700   P.O. 600    Total Intake(mL/kg) 600 (8.1)    Net +600          Urine Occurrence 4 x 1 x     Labs  Results for Nancy Beard, Nancy Beard (MRN [id number]) as of 06/24/2015 09:09  Ref. Range 06/24/2015 05:04  Sodium Latest Ref Range: 135-145 mmol/L 135  Potassium Latest Ref Range: 3.5-5.1 mmol/L 4.1  Chloride Latest Ref Range: 101-111 mmol/L 99 (L)  CO2 Latest Ref Range: 22-32 mmol/L 25  BUN Latest Ref Range: 6-20 mg/dL 6  Creatinine Latest Ref Range: 0.44-1.00 mg/dL 0.54  Calcium Latest Ref Range: 8.9-10.3 mg/dL 8.7 (L)  EGFR (Non-African Amer.) Latest Ref Range: >60 mL/min >60  EGFR (African American) Latest Ref Range: >60 mL/min >60  Glucose Latest Ref Range: 65-99 mg/dL 97  Anion gap Latest Ref Range: 5-15  11    Exam  Gen: awake and alert, resting comfortably in bed, NAD Lungs: clear   Cardiac: RRR, s1 and s2 Abd: + BS, NTND Ext:        Left Lower Extremity               Dressing c/d/i  Dressing changed  Incision stable  Mild erythema around soft tissues  No obvious drainage or purulence  All ex fix pinsites look great             Ext warm             + DP pulse             No dct               Compartments soft             DPN, SPN, TN sensation intact             EHL, FHL, AT,  PT, peroneals, gastroc motor intact             Active ankle extension weak             Swelling stable             Hinged knee brace fitting well, hyperextension to full flexion             pt using zero knee bone foam this am    Assessment and Plan    POD/HD#:3  59 y/o female s/p low energy fall with complex bicondylar Left tibial plateau fracture  1. Fall at work  2. Comminuted bicondylar L tibial plateau fracture s/p ORIF              NWB               Ok to get out of bed with assist  PT/OT evals             Ice               Elevate leg above heart                          Night splint to help prevent equinus contracture- ok to take off for ROM    Ok to use zero knee when in bed and take off PRAFO   theraband ankle exercises ok at this time               Hinged knee brace             Unrestricted ROM L knee                         L knee ROM- AROM, PROM. Prone exercises as well, if feasible. No ROM restrictions.  Quad sets, SLR, LAQ, SAQ, heel slides, stretching, prone flexion and extension             No pillows under bend of knee when at rest, ok to place under heel to help work on extension or use zero knee bone foam    Monitor soft tissue   OK TO SHOWER AND [place] SURGICAL WOUNDS WITH SOAP AND WATER. Apply dry dressing afterwards               3. Pain management:             continue with current regimen                           Use percocet as primary medication                         Oxy IR for breakthrough pain                           IV morphine for severe breakthrough pain  4. ABL anemia/Hemodynamics             Stable               5. Medical issues               Hypothyroid                         Home meds                         TSH ok               UTI                                                  Nitrofurantoin ER 18m po q12h, day 3/7    >10^5 of E. Coli     60k gram + cocci                             Sensitivities  pending               Elevated vitamin B 12  Pt was taking supplemental vitamin B PTA                         Will dc this medication                         Recheck labs thru PCP in several months   6. DVT/PE prophylaxis, hx of R leg DVT:             Lovenox x 21 days  7.ID:               periop abx  8.Metabolic Bone Disease:             Vitamin D insufficiency and mild hyperparathyroidism                         Add vitamin D supplementation                         Would like to obtain 1,25 vitamin d level but lab no longer offered at this institution- can do as outpt                            Add vitamin C and supplemental calcium   9.Activity:             Up with assistance             NWB Left leg  10. FEN/Foley/Lines:             Diet as tolerated  11. Dispo:             PT/OT             CIR today  Follow up with ortho 7-10 days after dc from [place], PA-C [facility] [phone] (O) 06/24/2015 9:08 AM

## 2015-06-24 NOTE — Care Management (Signed)
Patient's worker's comp medical nurse case manager- Dealer, is aware that patient will be going to Inpatient rehab, she came in to visit with patient this am. This Case manager signing off.

## 2015-06-24 NOTE — Progress Notes (Signed)
Pt ready to be d/c to CIR per MD. Report was called to [facility] on 4MW. Pt will be transported via nursing staff to [phone] with belongings.   [place], Jerry Caras

## 2015-06-24 NOTE — Progress Notes (Signed)
Rehab admissions - Met with patient.  She has informed husband of plan for admit to inpatient rehab.  We have approval for 6 day stay.  Bed available and will admit to acute inpatient rehab today.  Call me for questions.  #[phone]

## 2015-06-24 NOTE — Interval H&P Note (Signed)
Nancy Beard was admitted today to Inpatient Rehabilitation with the diagnosis of left tibial plateau fx.  The patient's history has been reviewed, patient examined, and there is no change in status.  Patient continues to be appropriate for intensive inpatient rehabilitation.  I have reviewed the patient's chart and labs.  Questions were answered to the patient's satisfaction. The PAPE has been reviewed and assessment remains appropriate.  SWARTZ,ZACHARY T 06/24/2015, 4:54 PM

## 2015-06-25 ENCOUNTER — Inpatient Hospital Stay (HOSPITAL_COMMUNITY): Payer: Self-pay | Admitting: Occupational Therapy

## 2015-06-25 ENCOUNTER — Inpatient Hospital Stay (HOSPITAL_COMMUNITY): Payer: Worker's Compensation | Admitting: Occupational Therapy

## 2015-06-25 ENCOUNTER — Inpatient Hospital Stay (HOSPITAL_COMMUNITY): Payer: Worker's Compensation | Admitting: *Deleted

## 2015-06-25 ENCOUNTER — Inpatient Hospital Stay (HOSPITAL_COMMUNITY): Payer: Worker's Compensation

## 2015-06-25 DIAGNOSIS — R609 Edema, unspecified: Secondary | ICD-10-CM

## 2015-06-25 DIAGNOSIS — R2689 Other abnormalities of gait and mobility: Secondary | ICD-10-CM

## 2015-06-25 LAB — ANTITHROMBIN III: AntiThromb III Func: 102 % (ref 75–120)

## 2015-06-25 MED ORDER — BISACODYL 10 MG RE SUPP: 10.0000 mg | Freq: Every day | RECTAL | Status: DC | PRN

## 2015-06-25 MED ORDER — SENNOSIDES-DOCUSATE SODIUM 8.6-50 MG PO TABS
2.0000 | ORAL_TABLET | Freq: Two times a day (BID) | ORAL | Status: DC
  Administered 2015-06-25 – 2015-07-01 (×11): 2 via ORAL
  Filled 2015-06-25 (×12): qty 2

## 2015-06-25 MED ORDER — ENOXAPARIN SODIUM 80 MG/0.8ML ~~LOC~~ SOLN: 1.0000 mg/kg | Freq: Two times a day (BID) | SUBCUTANEOUS | Status: DC

## 2015-06-25 MED ORDER — ENOXAPARIN SODIUM 80 MG/0.8ML ~~LOC~~ SOLN
1.0000 mg/kg | Freq: Two times a day (BID) | SUBCUTANEOUS | Status: DC
  Administered 2015-06-25 – 2015-06-29 (×8): 75 mg via SUBCUTANEOUS
  Filled 2015-06-25 (×8): qty 0.8

## 2015-06-25 MED ORDER — ENOXAPARIN SODIUM 30 MG/0.3ML ~~LOC~~ SOLN: 30.0000 mg | Freq: Two times a day (BID) | SUBCUTANEOUS | Status: DC

## 2015-06-25 NOTE — Progress Notes (Signed)
ANTICOAGULATION CONSULT NOTE - Initial Consult  Pharmacy Consult for enoxaparin  Indication: VTE treatment  Allergies  Allergen Reactions  . Ciprofloxacin Itching and Other (See Comments)    Patient states she had muscle spasms  . Dilaudid [Hydromorphone Hcl] Itching  . Gluten Meal Other (See Comments)    Patient Measurements: Height: 5\' 5"  (165.1 cm) Weight: 165 lb 2 oz (74.9 kg) IBW/kg (Calculated) : 57  Vital Signs: Temp: 98.8 F (37.1 C) (12/10 1423) Temp Source: Oral (12/10 1423) BP: 120/70 mmHg (12/10 1423) Pulse Rate: 116 (12/10 1423)  Labs:  Recent Labs  06/23/15 0328 06/24/15 0504 06/24/15 1807  HGB 9.1*  --  8.9*  HCT 27.5*  --  26.8*  PLT 339  --  443*  CREATININE 0.55 0.54  --     Estimated Creatinine Clearance: 75.8 mL/min (by C-G formula based on Cr of 0.54).   Medical History: Past Medical History  Diagnosis Date  . Thyroid disease   . Depression   . Sprue   . Breast cancer ([place])     right  . Hypothyroidism   . DVT (deep venous thrombosis) ([place]) 2014    Right lower extremity, thigh  . Wears glasses   . Pneumonia     hx of years ago  . History of chemotherapy   . Neuropathy ([facility])     of fingers and toes  . HEMORRHOIDS, EXTERNAL 05/05/2007  . DIVERTICULOSIS, COLON 05/05/2007  . Vitamin D insufficiency 06/20/2015    Assessment: 59 yo F found to have LE DVT. Pharmacy consulted to dose enoxaparin. Last dose of prophylactic enoxaparin was at 0800 this am. Hgb low at 8.9, plts 443. No s/s of bleed.  Goal of Therapy:  Anti-Xa level 0.6-1 units/ml 4hrs after LMWH dose given Monitor platelets by anticoagulation protocol: Yes   Plan:  Start enoxaparin 75mg  [place] Q12 Monitor CBC, s/s of bleed

## 2015-06-25 NOTE — Plan of Care (Signed)
Problem: RH PAIN MANAGEMENT Goal: RH STG PAIN MANAGED AT OR BELOW PT'S PAIN GOAL Outcome: Progressing Denies pain at this time

## 2015-06-25 NOTE — Evaluation (Signed)
Physical Therapy Assessment and Plan  Patient Details  Name: Nancy Beard MRN: [id number] Date of Birth: 08/07/1955  PT Diagnosis: Difficulty walking, Muscle weakness and Pain in L LE Rehab Potential: Good ELOS: 7-10 days   Today's Date: 06/25/2015 PT Individual Time: 0800-0900 PT Individual Time Calculation (min): 60 min    Problem List:  Patient Active Problem List   Diagnosis Date Noted  . Iron deficiency 06/24/2015  . Bacterial UTI 06/24/2015  . Breast cancer ([place])   . UTI (urinary tract infection) 06/22/2015  . Vitamin D insufficiency 06/20/2015  . Fracture of left tibial plateau 06/15/2015  . Fracture tibia/fibula 06/14/2015  . Genital herpes 09/06/2014  . Neuropathy ([place]) 09/06/2014  . Anticoagulant long-term use 05/01/2014  . Persistent headaches 02/22/2014  . Hot flashes related to aromatase inhibitor therapy 12/10/2013  . Vulvar intraepithelial neoplasia III (VIN III) 06/30/2013  . DVT (deep venous thrombosis) ([place]) 06/30/2013  . Osteoporosis 05/13/2013  . Primary cancer of lower-inner quadrant of right female breast ([place]) 06/24/2012  . Hyperlipidemia 05/13/2008  . Hypothyroidism 01/31/2007  . Depression 01/31/2007  . Celiac disease/sprue 01/31/2007    Past Medical History:  Past Medical History  Diagnosis Date  . Thyroid disease   . Depression   . Sprue   . Breast cancer ([place])     right  . Hypothyroidism   . DVT (deep venous thrombosis) ([place]) 2014    Right lower extremity, thigh  . Wears glasses   . Pneumonia     hx of years ago  . History of chemotherapy   . Neuropathy ([facility])     of fingers and toes  . HEMORRHOIDS, EXTERNAL 05/05/2007  . DIVERTICULOSIS, COLON 05/05/2007  . Vitamin D insufficiency 06/20/2015   Past Surgical History:  Past Surgical History  Procedure Laterality Date  . Esophagogastroduodenoscopy  2007    sprue  . Colonoscopy  2006  . Cryoblation of cervix      long time ago  . Breast lumpectomy      benign lump  . Total  mastectomy Bilateral 09/11/2012    Procedure: bilateral MASTECTOMY;  Surgeon: Odis Hollingshead, MD;  Location: [facility];  Service: General;  Laterality: Bilateral;  . Axillary sentinel node biopsy Right 09/11/2012    Procedure: AXILLARY SENTINEL lymph NODE  BIOPSY;  Surgeon: Odis Hollingshead, MD;  Location: [facility];  Service: General;  Laterality: Right;  right nuclear medicine injection 12:30   . Breast reconstruction with placement of tissue expander and flex hd (acellular hydrated dermis) Bilateral 09/11/2012    Procedure: BREAST RECONSTRUCTION WITH PLACEMENT OF TISSUE EXPANDER AND FLEX HD (ACELLULAR HYDRATED DERMIS) ADM;  Surgeon: Theodoro Kos, DO;  Location: [place];  Service: Plastics;  Laterality: Bilateral;  . Incision and drainage of wound Left 09/16/2012    Procedure: Left Breast Evacuation of Hematoma;  Surgeon: Theodoro Kos, DO;  Location: [place];  Service: Plastics;  Laterality: Left;  . Portacath placement Right 10/09/2012    Procedure: US GUIDED INSERTION PORT-A-CATH;  Surgeon: Odis Hollingshead, MD;  Location: [place];  Service: General;  Laterality: Right;  Right Subclavian Vein  . Removal of bilateral tissue expanders with placement of bilateral breast implants Bilateral 06/24/2013    Procedure: REMOVAL OF BILATERAL TISSUE EXPANDERS WITH PLACEMENT OF BILATERAL BREAST IMPLANTS;  Surgeon: Theodoro Kos, DO;  Location: [place];  Service: Plastics;  Laterality: Bilateral;  . Vulvectomy N/A 07/14/2013    Procedure: WIDE LOCAL  EXCISION VULVAR;  Surgeon:  Alvino Chapel, MD;  Location: [facility];  Service: Gynecology;  Laterality: N/A;  . Port-a-cath removal Right 08/07/2013    Procedure: REMOVAL PORT-A-CATH;  Surgeon: Odis Hollingshead, MD;  Location: [place];  Service: General;  Laterality: Right;  . Breast reconstruction Right 09/24/2013    Procedure: REVISION OF RIGHT BREAST RECONSTRUCTION WITH REPOSITIONING RIGHT IMPLANT, POSSIBLE  EXCISION CAPSULAR CONTRACTURE AND LIPOFILLING FOR FAT GRAFTING;  Surgeon: Theodoro Kos, DO;  Location: [place];  Service: Plastics;  Laterality: Right;  . Liposuction with lipofilling Bilateral 09/24/2013    Procedure: LIPOSUCTION WITH LIPOFILLING;  Surgeon: Theodoro Kos, DO;  Location: [facility];  Service: Plastics;  Laterality: Bilateral;  Biltalteal filling breast  . External fixation leg Left 06/15/2015    Procedure: EXTERNAL FIXATION LEG;  Surgeon: Altamese [place], MD;  Location: [place];  Service: Orthopedics;  Laterality: Left;  . Orif tibia plateau Left 06/21/2015    Procedure: OPEN REDUCTION INTERNAL FIXATION (ORIF) TIBIAL PLATEAU REMOVAL OF EXTERNAL FISTULA;  Surgeon: Altamese [place], MD;  Location: [facility] [facility];  Service: Orthopedics;  Laterality: Left;    Assessment & Plan Clinical Impression:Nancy Beard is a 59 y.o. right handed female admitted 06/15/2015. Independent prior to admission living with her husband. Two-level home bedroom on the main floor. Steps to entry. Reported fall 06/14/2015 down approximately 3 steps while at work. Patient works at [employer] [place]. She lost her footing and fell landing on her left hip. No loss of consciousness. X-rays and imaging revealed left bicondylar tibial plateau fracture. Underwent closed reduction of left bicondylar tibial plateau fracture application of spanning external fixator 06/15/2015 per Dr. Marcelino Scot and on 06/22/2015 underwent ORIF as well as repair of lateral meniscus through an arthrotomy, anterior compartment fasciotomy and removal of external fixator. Nonweightbearing left lower extremity. Subcutaneous Lovenox for DVT prophylaxis. Acute blood loss anemia 9.1 and monitored. Urine study 06/21/2015 greater than 100,000 gram-negative rods placed empirically on Macrobid with sensitivities pending. Hospital course pain management. Physical and occupational therapy evaluations completed and ongoing. M.D. has requested  physical medicine rehabilitation consult. Patient was admitted for comprehensive rehabilitation program    Patient transferred to CIR on 06/24/2015 .   Patient currently requires max with mobility secondary to muscle weakness, decreased cardiorespiratoy endurance and decreased standing balance and decreased balance strategies.  Prior to hospitalization, patient was independent  with mobility and lived with Spouse in a House home.  Home access is 3+1Stairs to enter.  Patient will benefit from skilled PT intervention to maximize safe functional mobility, minimize fall risk and decrease caregiver burden for planned discharge home with 24 hour supervision.  Anticipate patient will benefit from follow up [place] at discharge.  PT - End of Session Activity Tolerance: Tolerates < 10 min activity, no significant change in vital signs Endurance Deficit: Yes Endurance Deficit Description: Pt needed rest break following 2-3 min of activity, and between each task PT Assessment Rehab Potential (ACUTE/IP ONLY): Good Barriers to Discharge: Decreased caregiver support;Inaccessible home environment Barriers to Discharge Comments: 3 stairs no rails. Spouse reports they plan to have RN care first week after DC, but spouse needs to keep working PT Patient demonstrates impairments in the following area(s): Balance;Endurance;Pain PT Transfers Functional Problem(s): Bed Mobility;Bed to Chair;Car PT Locomotion Functional Problem(s): Ambulation;Wheelchair Mobility;Stairs PT Plan PT Intensity: Minimum of 1-2 x/day ,45 to 90 minutes PT Frequency: 5 out of 7 days PT Duration Estimated Length of Stay: 7-10 days PT Treatment/Interventions: Ambulation/gait training;Balance/vestibular training;Discharge planning;DME/adaptive equipment  instruction;Functional mobility training;Neuromuscular re-education;Pain management;Patient/family education;Psychosocial support;Skin care/wound management;Splinting/orthotics;Stair  training;Therapeutic Activities;Therapeutic Exercise;UE/LE Strength taining/ROM;Wheelchair propulsion/positioning PT Transfers Anticipated Outcome(s): Supervision PT Locomotion Anticipated Outcome(s): Supervision household distances PT Recommendation Follow Up Recommendations: Home health PT;24 hour supervision/assistance Patient destination: Home Equipment Recommended: To be determined;None recommended by PT;Other (comment) (Pt reports DME has already been delivered) Equipment Details: RW, WC, BSC  Skilled Therapeutic Intervention Pt tx initiated upon eval. Pt oriented to unit, call bell, PT POC, and principles of rehab. Spouse initially present, but went home for the day. PT requested clothing upon return. Spouse concerned about LOS due to high falls risk and recent falls. Pt was educated on fall risk reduction and precautions. Pt instructed in transfer training for basic stand-pivot transfer NWB with and without RW, sit<>stands NWB, sitting balance, standing balance training, stair training, and gait with RW x20'. Pt required up to Max A for mobility due to generalized weakness and weight of LLE brace. Due to time constraints, and pt needing to use bathroom upon arrival, car transfer was not performed and brace was not removed to assess ROM on LLE. Please follow-up as able. Pt instructed in seated marching and LAQ during seated rest breaks. Proper WC and elevating leg rest provided and nursing was asked about compression hose on RLE for DVT prevention. In general, pt reports that true NWB in standing puts uncomfortable traction on LLE, but resting weight of cast on ground alleviates pain. Otherwise pt reports 0/10 and seems to have excellent safety awareness.    PT Evaluation Precautions/Restrictions Precautions Precautions: Fall Required Braces or Orthoses: Other Brace/Splint Other Brace/Splint: LE brace Restrictions Weight Bearing Restrictions: Yes LLE Weight Bearing: Non weight  bearing General   Vital Signs Pain Pain Assessment Pain Assessment: No/denies pain Pain Score: 0-No pain Pain Onset: With Activity Pain Intervention(s): Medication (See eMAR) (prior to therapy) Home Living/Prior Functioning Home Living Available Help at Discharge: Family;Available PRN/intermittently Type of Home: House Home Access: Stairs to enter [employer] of Steps: 3+1 Entrance Stairs-Rails: None Home Layout: Two level;Able to live on main level with bedroom/bathroom Bathroom Shower/Tub: Walk-in shower;Door [employer] Toilet: Standard Bathroom Accessibility: Yes  Lives With: Spouse Prior Function Level of Independence: Independent with gait;Independent with transfers;Independent with basic ADLs  Able to Take Stairs?: Yes Driving: Yes Vocation: Full time employment Comments: works Merchandiser, retail - History Baseline Vision: Wears glasses all the time Patient Visual Report: No change from baseline  Cognition Overall Cognitive Status: Within Functional Limits for tasks assessed Arousal/Alertness: Awake/alert Orientation Level: Oriented X4 Memory: Appears intact Awareness: Appears intact Problem Solving: Appears intact Safety/Judgment: Appears intact Sensation Sensation Light Touch: Appears Intact Motor WFL    Mobility Bed Mobility Bed Mobility: Supine to Sit;Sitting - Scoot to Edge of Bed Supine to Sit: 3: Mod assist;With rails;HOB elevated Transfers Transfers: Yes Stand Pivot Transfers: 2: Max assist;From elevated surface;With armrests Stand Pivot Transfer Details: Verbal cues for precautions/safety;Verbal cues for technique;Verbal cues for sequencing;Verbal cues for safe use of DME/AE Stand Pivot Transfer Details (indicate cue type and reason): Lifting and lowering assist. Pt unable to keep LLE completely off floor during transitional movements.  Locomotion  Ambulation Ambulation: Yes Ambulation/Gait Assistance: 3: Mod  assist Ambulation Distance (Feet): 20 Feet Assistive device: Rolling walker Ambulation/Gait Assistance Details: Manual facilitation for weight shifting;Verbal cues for safe use of DME/AE;Verbal cues for gait pattern;Verbal cues for precautions/safety;Verbal cues for technique;Verbal cues for sequencing;Visual cues/gestures for precautions/safety;Visual cues for safe use of DME/AE Ambulation/Gait Assistance Details: Demonstration, trunk steadying  assist needed during swing on RLE. Pt able to keep LLE off ground during gait, but limited by weakness and fatigue.  Gait Gait: Yes Gait Pattern: Impaired Gait Pattern: Step-to pattern Gait velocity: decreased Stairs / Additional Locomotion Stairs: Yes Stairs Assistance: 2: Max Industrial/product designer Assistance Details (indicate cue type and reason): Lifting and lowering safely Stair Management Technique: Alternating pattern;Step to pattern;Forwards Number of Stairs: 1 Height of Stairs: 5 Wheelchair Mobility Wheelchair Mobility: Yes Wheelchair Assistance: 4: Energy manager: Both upper extremities Wheelchair Parts Management: Needs assistance Distance: 50  Trunk/Postural Assessment  Cervical Assessment Cervical Assessment: Within Functional Limits Thoracic Assessment Thoracic Assessment: Within Functional Limits Lumbar Assessment Lumbar Assessment: Within Functional Limits Postural Control Postural Control: Within Functional Limits  Balance Balance Balance Assessed: Yes Static Sitting Balance Static Sitting - Balance Support: No upper extremity supported;Feet supported Static Sitting - Level of Assistance: 7: Independent Dynamic Sitting Balance Dynamic Sitting - Balance Support: No upper extremity supported;During functional activity;Feet supported Dynamic Sitting - Level of Assistance: 5: Stand by assistance Static Standing Balance Static Standing - Balance Support: Bilateral upper extremity supported;During functional  activity Static Standing - Level of Assistance: 4: Min assist Dynamic Standing Balance Dynamic Standing - Balance Support: Bilateral upper extremity supported;During functional activity Dynamic Standing - Level of Assistance: 3: Mod assist Extremity Assessment  RUE Assessment RUE Assessment: Within Functional Limits LUE Assessment LUE Assessment: Within Functional Limits RLE Assessment RLE Assessment: Within Functional Limits LLE Assessment LLE Assessment: Exceptions to [facility] LLE Strength LLE Overall Strength Comments: Tested functionally due to time constraints and brace donned, but pt unable to move against gravity with brace, so likely ~3-/5   See Function Navigator for Current Functional Status.   Refer to Care Plan for Long Term Goals  Recommendations for other services: None  Discharge Criteria: Patient will be discharged from PT if patient refuses treatment 3 consecutive times without medical reason, if treatment goals not met, if there is a change in medical status, if patient makes no progress towards goals or if patient is discharged from hospital.  The above assessment, treatment plan, treatment alternatives and goals were discussed and mutually agreed upon: by patient  Euna Armon, Corinna Lines, PT, DPT  06/25/2015, 10:43 AM

## 2015-06-25 NOTE — Progress Notes (Signed)
59 y.o. right handed female admitted 06/15/2015. Independent prior to admission living with her husband. Two-level home bedroom on the main floor. Steps to entry. Reported fall 06/14/2015 down approximately 3 steps while at work. Patient works at [employer] [place]. She lost her footing and fell landing on her left hip. No loss of consciousness. X-rays and imaging revealed left bicondylar tibial plateau fracture. Underwent closed reduction of left bicondylar tibial plateau fracture application of spanning external fixator 06/15/2015 per Dr. Marcelino Scot and on 06/22/2015 underwent ORIF as well as repair of lateral meniscus through an arthrotomy, anterior compartment fasciotomy and removal of external fixator. Nonweightbearing left lower extremity. Subcutaneous Lovenox for DVT prophylaxis. Acute blood loss anemia 9.1 and monitored. Urine study 06/21/2015 greater than 100,000 gram-negative rods placed empirically on Macrobid   Subjective/Complaints: Questions about brace No BM x 3 days ROS:  noeg CP, Neg SOB, neg urinary c/os Objective: Vital Signs: Blood pressure 136/68, pulse 119, temperature 98 F (36.7 C), temperature source Oral, resp. rate 18, height 5\' 5"  (1.651 m), weight 74.9 kg (165 lb 2 oz), last menstrual period 09/11/2012, SpO2 98 %. No results found. Results for orders placed or performed during the hospital encounter of 06/24/15 (from the past 72 hour(s))  CBC     Status: Abnormal   Collection Time: 06/24/15  6:07 PM  Result Value Ref Range   WBC 8.0 4.0 - 10.5 K/uL   RBC 3.09 (L) 3.87 - 5.11 MIL/uL   Hemoglobin 8.9 (L) 12.0 - 15.0 g/dL   HCT 26.8 (L) 36.0 - 46.0 %   MCV 86.7 78.0 - 100.0 fL   MCH 28.8 26.0 - 34.0 pg   MCHC 33.2 30.0 - 36.0 g/dL   RDW 14.1 11.5 - 15.5 %   Platelets 443 (H) 150 - 400 K/uL     HEENT: normal Cardio: RRR and no murmur Resp: CTA B/L and unlabored GI: BS positive and non tender Extremity:  Pulses positive and No Edema Skin:   Intact and Wound Left hinged  knee orthosis Neuro: Alert/Oriented, Normal Sensory and Abnormal Motor 2- Left Hip flexor, 3- toe flex and ext on left, normal UE strength normal RLE strength Musc/Skel:  Swelling LLE Gen NAD   Assessment/Plan: 1. Functional deficits secondary to  left tibial plateau fracture after a fall while at work. which require 3+ hours per day of interdisciplinary therapy in a comprehensive inpatient rehab setting. Physiatrist is providing close team supervision and 24 hour management of active medical problems listed below. Physiatrist and rehab team continue to assess barriers to discharge/monitor patient progress toward functional and medical goals. FIM:     Medical Problem List and Plan: 1.  Weakness with gait deficits secondary to left tibial plateau fracture after a fall while at work. Nonweightbearing 2.  DVT Prophylaxis/Anticoagulation: Subcutaneous Lovenox. Monitor platelet counts of any signs of bleeding. Check vascular study on admit 3. Pain Management: Neurontin 900 mg daily at bedtime, Robaxin and oxycodone as needed.well controlled 4. Acute blood loss anemia. Follow-up CBC 5. Neuropsych: This patient is capable of making decisions on her own behalf. 6. Skin/Wound Care: Routine skin checks 7. Fluids/Electrolytes/Nutrition: Routine i's and o's with follow-up chemistries 8. Hypothyroidism. Cytomel/Synthroid 9. History of right breast cancer. Continue on arimidex, no joint pains 10.Mood/Depression. Effexor 150 mg daily. Provide emotional support 11. UTI/gram-negative rod. Continue Macrobid 7 days, enterococcus and ecoli are S 12.  Constipation will give dulc supp today, increase laxatives  Function - Comprehension Comprehension: Auditory Comprehension assist level: Follows complex conversation/direction with no assist  Function - Expression Expression: Verbal Expression assist level: Expresses complex ideas: With no assist  Function - Social  Interaction Social Interaction assist level: Interacts appropriately with others - No medications needed.  Function - Problem Solving Problem solving assist level: Solves complex problems: Recognizes & self-corrects  Function - Memory Memory assist level: Complete Independence: No helper Patient normally able to recall (first 3 days only): Current season, Location of own room, Staff names and faces, That he or she is in a hospital    LOS (Days) 1 A FACE TO [place] E 06/25/2015, 8:03 AM

## 2015-06-25 NOTE — Progress Notes (Addendum)
Occupational Therapy Session Note  Patient Details  Name: Nancy Beard MRN: [id number] Date of Birth: [birth year]  Today's Date: 06/25/2015 OT Individual Time:  - 1600-1715  (85min)      Short Term Goals: Week 1:  OT Short Term Goal 1 (Week 1): STG=LTG due to LOS  Skilled Therapeutic Interventions/Progress Updates:    Focus of treatment was bed mobility, transfers,, standing balance, transfers, therapeutic activities while engaged in dressing.  Pt donned UB with SEt  Up assist; needed assist with left leg and boot.  Transferred to wc and propelled to car.  Transferred to car with min assist with lifting left leg in car.  Educated on using leg lifter or sheet for lifting assistance.  Pt. Propelled back to room and doffed clothes and donned hospital gown.  Did lateral leans for donning/doffing pants over hips.  Husband present during entire session.  .Left with all needs in reach.  ,   Therapy Documentation Precautions:  Precautions Precautions: Fall Required Braces or Orthoses: Other Brace/Splint Other Brace/Splint: LE brace Restrictions Weight Bearing Restrictions: Yes LLE Weight Bearing: Non weight bearing General:     Pain: Pain Assessment Pain Assessment: 0-10 Pain Score:4 Pain Type: Acute pain Pain Location: Leg Pain Orientation: Left Pain Descriptors / Indicators: Sore Pain Onset: Gradual Pain Intervention(s): Medication (See eMAR)           See Function Navigator for Current Functional Status.   Therapy/Group: Individual Therapy  Lisa Roca 06/25/2015, 5:36 PM

## 2015-06-25 NOTE — Evaluation (Signed)
Occupational Therapy Assessment and Plan  Patient Details  Name: Nancy Beard MRN: [id number] Date of Birth: 13-Mar-1956  OT Diagnosis: acute pain and muscle weakness (generalized) Rehab Potential: Rehab Potential (ACUTE ONLY): Good ELOS: 7-10 days   Today's Date: 06/25/2015 OT Individual Time: 6659-9357 OT Individual Time Calculation (min): 52 min     Problem List:  Patient Active Problem List   Diagnosis Date Noted  . Iron deficiency 06/24/2015  . Bacterial UTI 06/24/2015  . Breast cancer ([facility])   . UTI (urinary tract infection) 06/22/2015  . Vitamin D insufficiency 06/20/2015  . Fracture of left tibial plateau 06/15/2015  . Fracture tibia/fibula 06/14/2015  . Genital herpes 09/06/2014  . Neuropathy ([place]) 09/06/2014  . Anticoagulant long-term use 05/01/2014  . Persistent headaches 02/22/2014  . Hot flashes related to aromatase inhibitor therapy 12/10/2013  . Vulvar intraepithelial neoplasia III (VIN III) 06/30/2013  . DVT (deep venous thrombosis) ([facility]) 06/30/2013  . Osteoporosis 05/13/2013  . Primary cancer of lower-inner quadrant of right female breast ([facility]) 06/24/2012  . Hyperlipidemia 05/13/2008  . Hypothyroidism 01/31/2007  . Depression 01/31/2007  . Celiac disease/sprue 01/31/2007    Past Medical History:  Past Medical History  Diagnosis Date  . Thyroid disease   . Depression   . Sprue   . Breast cancer ([facility])     right  . Hypothyroidism   . DVT (deep venous thrombosis) ([facility]) 2014    Right lower extremity, thigh  . Wears glasses   . Pneumonia     hx of years ago  . History of chemotherapy   . Neuropathy ([facility])     of fingers and toes  . HEMORRHOIDS, EXTERNAL 05/05/2007  . DIVERTICULOSIS, COLON 05/05/2007  . Vitamin D insufficiency 06/20/2015   Past Surgical History:  Past Surgical History  Procedure Laterality Date  . Esophagogastroduodenoscopy  2007    sprue  . Colonoscopy  2006  . Cryoblation of cervix      long time ago  . Breast lumpectomy       benign lump  . Total mastectomy Bilateral 09/11/2012    Procedure: bilateral MASTECTOMY;  Surgeon: Odis Hollingshead, MD;  Location: [facility];  Service: General;  Laterality: Bilateral;  . Axillary sentinel node biopsy Right 09/11/2012    Procedure: AXILLARY SENTINEL lymph NODE  BIOPSY;  Surgeon: Odis Hollingshead, MD;  Location: [facility];  Service: General;  Laterality: Right;  right nuclear medicine injection 12:30   . Breast reconstruction with placement of tissue expander and flex hd (acellular hydrated dermis) Bilateral 09/11/2012    Procedure: BREAST RECONSTRUCTION WITH PLACEMENT OF TISSUE EXPANDER AND FLEX HD (ACELLULAR HYDRATED DERMIS) ADM;  Surgeon: Theodoro Kos, DO;  Location: [facility];  Service: Plastics;  Laterality: Bilateral;  . Incision and drainage of wound Left 09/16/2012    Procedure: Left Breast Evacuation of Hematoma;  Surgeon: Theodoro Kos, DO;  Location: [facility];  Service: Plastics;  Laterality: Left;  . Portacath placement Right 10/09/2012    Procedure: US GUIDED INSERTION PORT-A-CATH;  Surgeon: Odis Hollingshead, MD;  Location: [facility];  Service: General;  Laterality: Right;  Right Subclavian Vein  . Removal of bilateral tissue expanders with placement of bilateral breast implants Bilateral 06/24/2013    Procedure: REMOVAL OF BILATERAL TISSUE EXPANDERS WITH PLACEMENT OF BILATERAL BREAST IMPLANTS;  Surgeon: Theodoro Kos, DO;  Location: [facility];  Service: Plastics;  Laterality: Bilateral;  . Vulvectomy N/A 07/14/2013    Procedure: WIDE LOCAL  EXCISION VULVAR;  Surgeon: Alvino Chapel, MD;  Location: [facility];  Service: Gynecology;  Laterality: N/A;  . Port-a-cath removal Right 08/07/2013    Procedure: REMOVAL PORT-A-CATH;  Surgeon: Odis Hollingshead, MD;  Location: [facility];  Service: General;  Laterality: Right;  . Breast reconstruction Right 09/24/2013    Procedure: REVISION OF RIGHT BREAST RECONSTRUCTION WITH REPOSITIONING  RIGHT IMPLANT, POSSIBLE EXCISION CAPSULAR CONTRACTURE AND LIPOFILLING FOR FAT GRAFTING;  Surgeon: Theodoro Kos, DO;  Location: [facility];  Service: Plastics;  Laterality: Right;  . Liposuction with lipofilling Bilateral 09/24/2013    Procedure: LIPOSUCTION WITH LIPOFILLING;  Surgeon: Theodoro Kos, DO;  Location: [facility];  Service: Plastics;  Laterality: Bilateral;  Biltalteal filling breast  . External fixation leg Left 06/15/2015    Procedure: EXTERNAL FIXATION LEG;  Surgeon: Altamese Paderborn, MD;  Location: [facility];  Service: Orthopedics;  Laterality: Left;  . Orif tibia plateau Left 06/21/2015    Procedure: OPEN REDUCTION INTERNAL FIXATION (ORIF) TIBIAL PLATEAU REMOVAL OF EXTERNAL FISTULA;  Surgeon: Altamese Houghton, MD;  Location: [facility];  Service: Orthopedics;  Laterality: Left;    Assessment & Plan Clinical Impression: Nancy Beard is a 59 y.o. right handed female admitted 06/15/2015. Independent prior to admission living with her husband. Two-level home bedroom on the main floor. Steps to entry. Reported fall 06/14/2015 down approximately 3 steps while at work. Patient works at [employer] [place]. She lost her footing and fell landing on her left hip. No loss of consciousness. X-rays and imaging revealed left bicondylar tibial plateau fracture. Underwent closed reduction of left bicondylar tibial plateau fracture application of spanning external fixator 06/15/2015 per Dr. Marcelino Scot and on 06/22/2015 underwent ORIF as well as repair of lateral meniscus through an arthrotomy, anterior compartment fasciotomy and removal of external fixator. Nonweightbearing left lower extremity. Subcutaneous Lovenox for DVT prophylaxis. Acute blood loss anemia 9.1 and monitored. Urine study 06/21/2015 greater than 100,000 gram-negative rods placed empirically on Macrobid with sensitivities pending. Hospital course pain management. Physical and occupational therapy evaluations completed and  ongoing. M.D. has requested physical medicine rehabilitation consult. Patient was admitted for comprehensive rehabilitation program  Patient transferred to CIR on 06/24/2015 .    Patient currently requires min with basic self-care skills secondary to decreased standing balance, decreased postural control and decreased balance strategies.  Prior to hospitalization, patient could complete ADLs/IADLs with independent .  Patient will benefit from skilled intervention to decrease level of assist with basic self-care skills, increase independence with basic self-care skills and increase level of independence with iADL prior to discharge home with care partner.  Anticipate patient will require intermittent supervision and follow up home health.  OT - End of Session Activity Tolerance: Tolerates 30+ min activity with multiple rests Endurance Deficit: Yes OT Assessment Rehab Potential (ACUTE ONLY): Good Barriers to Discharge: Decreased caregiver support Barriers to Discharge Comments: Pt husband works during days, she will be alone at home during the day OT Patient demonstrates impairments in the following area(s): Balance;Endurance;Safety;Pain OT Basic ADL's Functional Problem(s): Bathing;Dressing;Toileting OT Advanced ADL's Functional Problem(s): Simple Meal Preparation;Laundry;Light Housekeeping OT Transfers Functional Problem(s): Toilet;Tub/Shower OT Additional Impairment(s): None OT Plan OT Intensity: Minimum of 1-2 x/day, 45 to 90 minutes OT Frequency: 5 out of 7 days OT Duration/Estimated Length of Stay: 7-10 days OT Treatment/Interventions: Balance/vestibular training;Discharge planning;Community reintegration;DME/adaptive equipment instruction;Functional mobility training;Patient/family education;Pain management;Therapeutic Activities;Therapeutic Exercise;UE/LE Strength taining/ROM;UE/LE Coordination activities;Wheelchair propulsion/positioning OT Self Feeding Anticipated Outcome(s):  Independent OT Basic Self-Care Anticipated Outcome(s): Mod I  OT Toileting Anticipated Outcome(s): Mod I OT Bathroom Transfers Anticipated Outcome(s): Mod I toilet; Supervision shower OT Recommendation Patient destination: Home Follow Up Recommendations: Home health OT Equipment Recommended: To be determined Equipment Details: Pt has BSC   Skilled Therapeutic Intervention Pt seen for OT eval and ADL bathing and dressing session. Pt sitting up in w/c upon arrival, declining pain and excited for showering task. She ambulated throughout room using RW with steadying assist. She bathed seated on tub bench, using lateral leans to complete buttock hygiene. Pt without personal clothes at this time, hospital gown donned seated on tub bench. Recommended big, loose fitting clothing for rehab to ease LB dressing task over LE brace. She returned to w/c and set-up with grooming items at the sink. Left in w/c at sink blow drying hair. NT made aware of pt's position and asked to check in on pt as she was left without call bell in immediate reach.  Pt educated throughout session regarding role of OT, POC, OT goals, need for assist, and d/c planning.   OT Evaluation Precautions/Restrictions  Precautions Precautions: Fall Required Braces or Orthoses: Other Brace/Splint Other Brace/Splint: LE brace Restrictions Weight Bearing Restrictions: Yes LLE Weight Bearing: Non weight bearing General Chart Reviewed: Yes Pain Pain Assessment Pain Assessment: No/denies pain Pain Score: 0-No pain Home Living/Prior Functioning Home Living Available Help at Discharge: Family, Available PRN/intermittently Type of Home: House Home Access: Stairs to enter Technical brewer of Steps: 3+1 Entrance Stairs-Rails: None Home Layout: Two level, Able to live on main level with bedroom/bathroom Bathroom Shower/Tub: Gaffer, Door, Chiropodist: Programmer, systems: Yes  Lives With:  Spouse IADL History Homemaking Responsibilities: Yes Meal Prep Responsibility: Therapist, occupational Responsibility: Primary Cleaning Responsibility: Primary Prior Function Level of Independence: Independent with gait, Independent with transfers, Independent with basic ADLs, Independent with homemaking with ambulation  Able to Take Stairs?: Yes Driving: Yes Vocation: Full time employment Leisure: Hobbies-yes (Comment) Comments: works Merchandiser, retail- History Baseline Vision/History: Wears glasses Wears Glasses: At all times Patient Visual Report: No change from baseline  Cognition Overall Cognitive Status: Within Functional Limits for tasks assessed Arousal/Alertness: Awake/alert Orientation Level: Person;Place;Situation Person: Oriented Place: Oriented Situation: Oriented Year: 2016 Month: December Day of Week: Correct Memory: Appears intact Immediate Memory Recall: Sock;Blue;Bed Memory Recall: Sock;Blue;Bed Memory Recall Sock: Without Cue Memory Recall Blue: Without Cue Memory Recall Bed: Without Cue Awareness: Appears intact Problem Solving: Appears intact Safety/Judgment: Appears intact Sensation Sensation Light Touch: Appears Intact Coordination Gross Motor Movements are Fluid and Coordinated: No Fine Motor Movements are Fluid and Coordinated: Yes Coordination and Movement Description: Impaired due to NWB LLE Motor  Motor Motor: Within Functional Limits Trunk/Postural Assessment  Cervical Assessment Cervical Assessment: Within Functional Limits Thoracic Assessment Thoracic Assessment: Within Functional Limits Lumbar Assessment Lumbar Assessment: Within Functional Limits Postural Control Postural Control: Within Functional Limits  Balance Balance Balance Assessed: Yes Static Sitting Balance Static Sitting - Balance Support: No upper extremity supported;Feet supported Static Sitting - Level of Assistance: 7: Independent Dynamic  Sitting Balance Dynamic Sitting - Balance Support: No upper extremity supported;During functional activity;Feet supported Dynamic Sitting - Level of Assistance: 5: Stand by assistance Static Standing Balance Static Standing - Balance Support: Bilateral upper extremity supported;During functional activity Static Standing - Level of Assistance: 4: Min assist Dynamic Standing Balance Dynamic Standing - Balance Support: During functional activity;Bilateral upper extremity supported Dynamic Standing - Level of Assistance: 3: Mod assist Extremity/Trunk Assessment RUE Assessment RUE Assessment: Within Functional Limits  LUE Assessment LUE Assessment: Within Functional Limits   See Function Navigator for Current Functional Status.   Refer to Care Plan for Long Term Goals  Recommendations for other services: None  Discharge Criteria: Patient will be discharged from OT if patient refuses treatment 3 consecutive times without medical reason, if treatment goals not met, if there is a change in medical status, if patient makes no progress towards goals or if patient is discharged from hospital.  The above assessment, treatment plan, treatment alternatives and goals were discussed and mutually agreed upon: by patient  Ernestina Patches 06/25/2015, 12:39 PM

## 2015-06-25 NOTE — Progress Notes (Signed)
VASCULAR LAB PRELIMINARY  PRELIMINARY  PRELIMINARY  PRELIMINARY  Bilateral lower extremity venous duplex  completed.    Preliminary report:  Right:  No evidence of DVT, superficial thrombosis, or Baker's cyst.  Left: DVT noted in the peroneal vein.  No evidence of superficial thrombosis.  No Baker's cyst.   Gertrue Willette, RVT 06/25/2015, 3:58 PM

## 2015-06-25 NOTE — Progress Notes (Addendum)
Orthopaedic Trauma Service (OTS)   Subjective: Patient reports pain as mild.  DVT study showed resolution in the right LE DVT from several years ago, and a new peroneal vein DVT on the left.  Objective: Temp:  [98 F (36.7 C)-98.8 F (37.1 C)] 98.8 F (37.1 C) (12/10 1423) Pulse Rate:  [116-119] 116 (12/10 1423) Resp:  [18] 18 (12/10 1423) BP: (120-136)/(68-70) 120/70 mmHg (12/10 1423) SpO2:  [98 %-99 %] 99 % (12/10 1423)  Physical Exam LLE Dressing intact, clean, dry  Edema/ swelling controlled  Sens: DPN, SPN, TN intact  Motor: EHL, FHL, and lessor toe ext and flex all intact grossly  Brisk cap refill, warm to touch  Assessment/Plan: 1. DVT Left peroneal vein 2. S/p ORIF L bicondylar plateau  1. Given h/o malignancy and contralateral DVT that required long term tx to resolve, will pursue more aggressive tx despite distal location.  Increase lovenox to treatment dose at 1mg /kg bid and begin hypercoag w/u 2. Continue PT/ OT with aggressive motion and NWB on LLE 3. May remove hinge with PT supervision or when flat in bed with support   Altamese [place], MD [facility] [phone] (p)

## 2015-06-26 ENCOUNTER — Inpatient Hospital Stay (HOSPITAL_COMMUNITY): Payer: Worker's Compensation | Admitting: Occupational Therapy

## 2015-06-26 DIAGNOSIS — I824Z2 Acute embolism and thrombosis of unspecified deep veins of left distal lower extremity: Secondary | ICD-10-CM

## 2015-06-26 NOTE — Progress Notes (Signed)
59 y.o. right handed female admitted 06/15/2015. Independent prior to admission living with her husband. Two-level home bedroom on the main floor. Steps to entry. Reported fall 06/14/2015 down approximately 3 steps while at work. Patient works at [employer] [place]. She lost her footing and fell landing on her left hip. No loss of consciousness. X-rays and imaging revealed left bicondylar tibial plateau fracture. Underwent closed reduction of left bicondylar tibial plateau fracture application of spanning external fixator 06/15/2015 per Dr. Marcelino Scot and on 06/22/2015 underwent ORIF as well as repair of lateral meniscus through an arthrotomy, anterior compartment fasciotomy and removal of external fixator. Nonweightbearing left lower extremity. Subcutaneous Lovenox for DVT prophylaxis.  Subjective/Complaints: Pt anxious about situation and DVT Left  peroneal DVT, prior hx of right  Cont BM during PT ROS:  noeg CP, Neg SOB, neg urinary c/os Objective: Vital Signs: Blood pressure 123/64, pulse 105, temperature 98 F (36.7 C), temperature source Oral, resp. rate 18, height 5\' 5"  (1.651 m), weight 74.9 kg (165 lb 2 oz), last menstrual period 09/11/2012, SpO2 98 %. No results found. Results for orders placed or performed during the hospital encounter of 06/24/15 (from the past 72 hour(s))  CBC     Status: Abnormal   Collection Time: 06/24/15  6:07 PM  Result Value Ref Range   WBC 8.0 4.0 - 10.5 K/uL   RBC 3.09 (L) 3.87 - 5.11 MIL/uL   Hemoglobin 8.9 (L) 12.0 - 15.0 g/dL   HCT 26.8 (L) 36.0 - 46.0 %   MCV 86.7 78.0 - 100.0 fL   MCH 28.8 26.0 - 34.0 pg   MCHC 33.2 30.0 - 36.0 g/dL   RDW 14.1 11.5 - 15.5 %   Platelets 443 (H) 150 - 400 K/uL  Antithrombin III     Status: None   Collection Time: 06/25/15  8:47 PM  Result Value Ref Range   AntiThromb III Func 102 75 - 120 %     HEENT: normal Cardio: RRR and no murmur Resp: CTA B/L and unlabored GI: BS positive and non tender Extremity:  Pulses  positive and No Edema Skin:   Intact and Wound Left hinged knee orthosis Neuro: Alert/Oriented, Normal Sensory and Abnormal Motor 2- Left Hip flexor, 3- toe flex and ext on left, normal UE strength normal RLE strength Musc/Skel:  Swelling LLE Gen NAD   Assessment/Plan: 1. Functional deficits secondary to  left tibial plateau fracture after a fall while at work. which require 3+ hours per day of interdisciplinary therapy in a comprehensive inpatient rehab setting. Physiatrist is providing close team supervision and 24 hour management of active medical problems listed below. Physiatrist and rehab team continue to assess barriers to discharge/monitor patient progress toward functional and medical goals. FIM: Function - Bathing Position: Shower Body parts bathed by patient: Right arm, Left arm, Chest, Abdomen, Front perineal area, Buttocks, Back, Right lower leg, Left upper leg, Right upper leg Bathing not applicable: Left lower leg Assist Level: Supervision or verbal cues   Medical Problem List and Plan: 1.  Weakness with gait deficits secondary to left tibial plateau fracture after a fall while at work. Nonweightbearing 2.  DVT Prophylaxis/Anticoagulation: Subcutaneous Lovenox, full dose. For LEFT  peroneal DVT, per ortho, hypercoag due to breast Ca, prior hx DVT on right side, was on Xarelto x 18 mo, will likely need Xarelto on D/C, had no complications from this med             3. Pain Management: Neurontin 900 mg  daily at bedtime, Robaxin and oxycodone as needed.well controlled 4. Acute blood loss anemia. Last hgb 8.9 5. Neuropsych: This patient is capable of making decisions on her own behalf. 6. Skin/Wound Care: Routine skin checks 7. Fluids/Electrolytes/Nutrition: Routine i's and o's with follow-up chemistries 8. Hypothyroidism. Cytomel/Synthroid 9. History of right breast cancer. Continue on arimidex, no joint pains 10.Mood/Depression. Effexor 150 mg daily. Provide emotional  support 11. UTI/gram-negative rod. Continue Macrobid 7 days, enterococcus and ecoli are S 12.  Constipation  increase laxatives, formed stool yest  Function- Upper Body Dressing/Undressing What is the patient wearing?: Bra, Pull over shirt/dress Bra - Perfomed by patient: Thread/unthread right bra strap, Thread/unthread left bra strap, Hook/unhook bra (pull down sports bra) Pull over shirt/dress - Perfomed by patient: Thread/unthread right sleeve, Thread/unthread left sleeve, Put head through opening, Pull shirt over trunk Assist Level: Set up Function - Lower Body Dressing/Undressing What is the patient wearing?: Underwear, Pants, Socks, Shoes Position: Sitting EOB Underwear - Performed by patient: Thread/unthread right underwear leg, Thread/unthread left underwear leg, Pull underwear up/down Pants- Performed by patient: Thread/unthread right pants leg, Pull pants up/down Pants- Performed by helper: Thread/unthread left pants leg Socks - Performed by patient: Don/doff right sock Shoes - Performed by patient: Don/doff right shoe (assist with Prafo boot on left leg) Assist for footwear: Supervision/touching assist Assist for lower body dressing: Touching or steadying assistance (Pt > 75%)  Function - Toileting Toileting steps completed by patient: Adjust clothing prior to toileting, Performs perineal hygiene, Adjust clothing after toileting Toileting Assistive Devices: Grab bar or rail Assist level: Touching or steadying assistance (Pt.75%)  Function - Air cabin crew transfer assistive device: Elevated toilet seat/BSC over toilet Assist level to toilet: Touching or steadying assistance (Pt > 75%) Assist level from toilet: Touching or steadying assistance (Pt > 75%)  Function - Chair/bed transfer Chair/bed transfer method: Stand pivot Chair/bed transfer assist level: Maximal assist (Pt 25 - 49%/lift and lower) Chair/bed transfer assistive device: Bedrails, Armrests, Walker,  Orthosis Chair/bed transfer details: Verbal cues for sequencing, Verbal cues for technique, Verbal cues for precautions/safety, Verbal cues for safe use of DME/AE, Manual facilitation for placement, Manual facilitation for weight shifting  Function - Locomotion: Wheelchair Will patient use wheelchair at discharge?: Yes Type: Manual Max wheelchair distance: 50 Assist Level: Touching or steadying assistance (Pt > 75%) Assist Level: Touching or steadying assistance (Pt > 75%) Wheel 150 feet activity did not occur: Safety/medical concerns Turns around,maneuvers to table,bed, and toilet,negotiates 3% grade,maneuvers on rugs and over doorsills: No Function - Locomotion: Ambulation Assistive device: Walker-rolling, Orthosis Max distance: 15 Assist level: Moderate assist (Pt 50 - 74%) Assist level: Moderate assist (Pt 50 - 74%) Walk 50 feet with 2 turns activity did not occur: Safety/medical concerns Walk 150 feet activity did not occur: Safety/medical concerns Walk 10 feet on uneven surfaces activity did not occur: Safety/medical concerns  Function - Comprehension Comprehension: Auditory Comprehension assist level: Follows complex conversation/direction with no assist  Function - Expression Expression: Verbal Expression assist level: Expresses complex ideas: With no assist  Function - Social Interaction Social Interaction assist level: Interacts appropriately with others - No medications needed.  Function - Problem Solving Problem solving assist level: Solves complex problems: Recognizes & self-corrects  Function - Memory Memory assist level: Complete Independence: No helper Patient normally able to recall (first 3 days only): Current season, Location of own room, Staff names and faces, That he or she is in a hospital    LOS (Days) 2 A FACE  TO FACE EVALUATION WAS PERFORMED  Valerie Fredin E 06/26/2015, 8:01 AM

## 2015-06-26 NOTE — Progress Notes (Signed)
Occupational Therapy Session Note  Patient Details  Name: Nancy Beard MRN: [id number] Date of Birth: 1956-01-20  Today's Date: 06/26/2015 OT Individual Time:  -   [id number]  (62 min)      Short Term Goals: Week 1:  OT Short Term Goal 1 (Week 1): STG=LTG due to LOS Week 2:    Week 3:     Skilled Therapeutic Interventions/Progress Updates:    Engaged in functional mobility using 2WW.  Addressed mobility, balance in functional ADL.  Pt ambulated with 2WW to ADL apt.  Engaged in mobility in the kitchen with OT instucting pt on safety with walker and best practices for balance and doing task in safe manner.  Pt demonstrates getting items out of refrigerator, opening low cabinets to get a pot, and passing items from one area to another.  Educated pt on walker bag, basket and rolling cart for carrying items.  No cloth walker bags in stock.  Pt returned demonstrated on kitchen safety with min instructional cues.  Can benefit from further practice.  .    Therapy Documentation Precautions:  Precautions Precautions: Fall Required Braces or Orthoses: Other Brace/Splint Other Brace/Splint: LE brace Restrictions Weight Bearing Restrictions: Yes LLE Weight Bearing: Non weight bearing      Pain: Pain Assessment Pain Assessment: 0-10 Pain Score: 3  (with therapy) Pain Type: Acute pain Pain Location: Leg Pain Orientation: Left Pain Descriptors / Indicators: Sore Pain Frequency: Intermittent Pain Onset: With Activity Pain Intervention(s): Medication (See eMAR)     :    See Function Navigator for Current Functional Status.   Therapy/Group: Individual Therapy  Lisa Roca 06/26/2015, 5:19 PM

## 2015-06-27 ENCOUNTER — Inpatient Hospital Stay (HOSPITAL_COMMUNITY): Payer: Worker's Compensation | Admitting: Physical Therapy

## 2015-06-27 ENCOUNTER — Inpatient Hospital Stay (HOSPITAL_COMMUNITY): Payer: Worker's Compensation

## 2015-06-27 ENCOUNTER — Inpatient Hospital Stay (HOSPITAL_COMMUNITY): Payer: Self-pay | Admitting: Physical Therapy

## 2015-06-27 DIAGNOSIS — R748 Abnormal levels of other serum enzymes: Secondary | ICD-10-CM

## 2015-06-27 DIAGNOSIS — E559 Vitamin D deficiency, unspecified: Secondary | ICD-10-CM

## 2015-06-27 DIAGNOSIS — F329 Major depressive disorder, single episode, unspecified: Secondary | ICD-10-CM

## 2015-06-27 DIAGNOSIS — K900 Celiac disease: Secondary | ICD-10-CM

## 2015-06-27 DIAGNOSIS — E611 Iron deficiency: Secondary | ICD-10-CM

## 2015-06-27 DIAGNOSIS — E038 Other specified hypothyroidism: Secondary | ICD-10-CM

## 2015-06-27 DIAGNOSIS — I82432 Acute embolism and thrombosis of left popliteal vein: Secondary | ICD-10-CM

## 2015-06-27 DIAGNOSIS — D473 Essential (hemorrhagic) thrombocythemia: Secondary | ICD-10-CM

## 2015-06-27 DIAGNOSIS — D75839 Thrombocytosis, unspecified: Secondary | ICD-10-CM

## 2015-06-27 DIAGNOSIS — E034 Atrophy of thyroid (acquired): Secondary | ICD-10-CM

## 2015-06-27 DIAGNOSIS — R740 Nonspecific elevation of levels of transaminase and lactic acid dehydrogenase [LDH]: Secondary | ICD-10-CM

## 2015-06-27 LAB — COMPREHENSIVE METABOLIC PANEL
ALT: 278 U/L — ABNORMAL HIGH (ref 14–54)
AST: 237 U/L — ABNORMAL HIGH (ref 15–41)
Albumin: 2.7 g/dL — ABNORMAL LOW (ref 3.5–5.0)
Alkaline Phosphatase: 802 U/L — ABNORMAL HIGH (ref 38–126)
Anion gap: 8 (ref 5–15)
BUN: 6 mg/dL (ref 6–20)
CO2: 29 mmol/L (ref 22–32)
Calcium: 9 mg/dL (ref 8.9–10.3)
Chloride: 100 mmol/L — ABNORMAL LOW (ref 101–111)
Creatinine, Ser: 0.58 mg/dL (ref 0.44–1.00)
GFR calc Af Amer: >60 mL/min (ref >60)
GFR calc non Af Amer: >60 mL/min (ref >60)
Glucose, Bld: 119 mg/dL — ABNORMAL HIGH (ref 65–99)
Potassium: 3.5 mmol/L (ref 3.5–5.1)
Sodium: 137 mmol/L (ref 135–145)
Total Bilirubin: 0.9 mg/dL (ref 0.3–1.2)
Total Protein: 6.6 g/dL (ref 6.5–8.1)

## 2015-06-27 LAB — CBC WITH DIFFERENTIAL/PLATELET
Basophils Absolute: 0.1 10*3/uL (ref 0.0–0.1)
Basophils Relative: 1 %
Eosinophils Absolute: 0.2 10*3/uL (ref 0.0–0.7)
Eosinophils Relative: 2 %
HCT: 28.9 % — ABNORMAL LOW (ref 36.0–46.0)
Hemoglobin: 9.5 g/dL — ABNORMAL LOW (ref 12.0–15.0)
Lymphocytes Relative: 38 %
Lymphs Abs: 3.7 10*3/uL (ref 0.7–4.0)
MCH: 28.6 pg (ref 26.0–34.0)
MCHC: 32.9 g/dL (ref 30.0–36.0)
MCV: 87 fL (ref 78.0–100.0)
Monocytes Absolute: 1.2 10*3/uL — ABNORMAL HIGH (ref 0.1–1.0)
Monocytes Relative: 12 %
Neutro Abs: 4.5 10*3/uL (ref 1.7–7.7)
Neutrophils Relative %: 47 %
Platelets: 570 10*3/uL — ABNORMAL HIGH (ref 150–400)
RBC: 3.32 MIL/uL — ABNORMAL LOW (ref 3.87–5.11)
RDW: 14.6 % (ref 11.5–15.5)
WBC: 9.7 10*3/uL (ref 4.0–10.5)

## 2015-06-27 MED ORDER — FOSFOMYCIN TROMETHAMINE 3 G PO PACK
3.0000 g | PACK | Freq: Once | ORAL | Status: AC
  Administered 2015-06-27: 3 g via ORAL
  Filled 2015-06-27: qty 3

## 2015-06-27 NOTE — Progress Notes (Signed)
Patient information reviewed and entered into [facility] system by Geanette Buonocore, RN, CRRN, PPS Coordinator.  Information including medical coding and functional independence measure will be reviewed and updated through discharge.    

## 2015-06-27 NOTE — Progress Notes (Signed)
Occupational Therapy Session Note  Patient Details  Name: Nancy Beard MRN: [id number] Date of Birth: Nov 26, 1955  Today's Date: 06/27/2015 OT Individual Time: 0930-1030 OT Individual Time Calculation (min): 60 min   Short Term Goals: Week 1:  OT Short Term Goal 1 (Week 1): STG=LTG due to LOS  Skilled Therapeutic Interventions/Progress Updates: Therapeutic activities with focus on functional transfers, home safety, AE use (reacher).   Pt received seated in w/c awaiting therapist.   Pt declared that she had bathed during the weekend and deferred BADL during this session.   Pt requested assist with discharge planning, to address shower transfers.   Using shower simulation pt completed transfer, backing to lip of shower and recovered to shower chair placed near edge with overall mod assist and instructional cues.   OT educated pt on available DME to include shower stool, grab bars (suction and standard), tub bench, and replacement faucet for standard faucet to accommodate hand shower attachment (with images printed).  Pt educated and trained on use of reacher at w/c level and use of RW accessories (bags) to enhance safety.   Pt provided 1 wheelchair glove d/t complaint of pressure on palms while using RW.   Pt returned to her room at end of session with all needs within reach.       Therapy Documentation Precautions:  Precautions Precautions: Fall Required Braces or Orthoses: Other Brace/Splint Other Brace/Splint: LE brace Restrictions Weight Bearing Restrictions: Yes LLE Weight Bearing: Non weight bearing  Pain: No/denies pain   See Function Navigator for Current Functional Status.   Therapy/Group: Individual Therapy   Second session: Time: 1330-1400 Time Calculation (min):  30 min  Pain Assessment: No/denies pain  Skilled Therapeutic Interventions: Therapeutic activities with focus on functional mobility, endurance, dynamic standing balance, and home safety (tub bench  transfer).   Pt educated on use of walker accessories (bag for reacher and another for personal items).   Pt demo'd ability to use reacher to remove elevating leg rest security strap unassisted.   Pt ambulated within congested area (hospital gift shop) with min guard assist while seeking assistance from clerk.   Pt was escorted to tub room and completed stand-pivot transfer to bench with instructional cues and standby assist.   Per dw Probation officer, pt agreed to attempt shower level bathing using tub bench for trial prior to d/c.   OT provided home measurements worksheet and literature on faucet with diverter for hand shower for her husband's inspection while in prep for d/c.   See FIM for current functional status  Therapy/Group: Individual Therapy  [place] 06/27/2015, 12:11 PM

## 2015-06-27 NOTE — Progress Notes (Signed)
Subjective/Complaints: Pt seen this AM sitting in her recliner.  Pt states she initially had some pain overnight, but after receiving her pain meds, she slept very well through the night.  She denies pain this AM.    ROS:  Denies CP, SOB, n/v/d.  Objective: Vital Signs: No results found. Results for orders placed or performed during the hospital encounter of 06/24/15 (from the past 72 hour(s))  CBC     Status: Abnormal   Collection Time: 06/24/15  6:07 PM  Result Value Ref Range   WBC 8.0 4.0 - 10.5 K/uL   RBC 3.09 (L) 3.87 - 5.11 MIL/uL   Hemoglobin 8.9 (L) 12.0 - 15.0 g/dL   HCT 26.8 (L) 36.0 - 46.0 %   MCV 86.7 78.0 - 100.0 fL   MCH 28.8 26.0 - 34.0 pg   MCHC 33.2 30.0 - 36.0 g/dL   RDW 14.1 11.5 - 15.5 %   Platelets 443 (H) 150 - 400 K/uL  Antithrombin III     Status: None   Collection Time: 06/25/15  8:47 PM  Result Value Ref Range   AntiThromb III Func 102 75 - 120 %  CBC WITH DIFFERENTIAL     Status: Abnormal   Collection Time: 06/27/15  4:44 AM  Result Value Ref Range   WBC 9.7 4.0 - 10.5 K/uL   RBC 3.32 (L) 3.87 - 5.11 MIL/uL   Hemoglobin 9.5 (L) 12.0 - 15.0 g/dL   HCT 28.9 (L) 36.0 - 46.0 %   MCV 87.0 78.0 - 100.0 fL   MCH 28.6 26.0 - 34.0 pg   MCHC 32.9 30.0 - 36.0 g/dL   RDW 14.6 11.5 - 15.5 %   Platelets 570 (H) 150 - 400 K/uL   Neutrophils Relative % 47 %   Neutro Abs 4.5 1.7 - 7.7 K/uL   Lymphocytes Relative 38 %   Lymphs Abs 3.7 0.7 - 4.0 K/uL   Monocytes Relative 12 %   Monocytes Absolute 1.2 (H) 0.1 - 1.0 K/uL   Eosinophils Relative 2 %   Eosinophils Absolute 0.2 0.0 - 0.7 K/uL   Basophils Relative 1 %   Basophils Absolute 0.1 0.0 - 0.1 K/uL  Comprehensive metabolic panel     Status: Abnormal   Collection Time: 06/27/15  4:44 AM  Result Value Ref Range   Sodium 137 135 - 145 mmol/L   Potassium 3.5 3.5 - 5.1 mmol/L   Chloride 100 (L) 101 - 111 mmol/L   CO2 29 22 - 32 mmol/L   Glucose, Bld 119 (H) 65 - 99 mg/dL   BUN 6 6 - 20 mg/dL   Creatinine,  Ser 0.58 0.44 - 1.00 mg/dL   Calcium 9.0 8.9 - 10.3 mg/dL   Total Protein 6.6 6.5 - 8.1 g/dL   Albumin 2.7 (L) 3.5 - 5.0 g/dL   AST 237 (H) 15 - 41 U/L   ALT 278 (H) 14 - 54 U/L   Alkaline Phosphatase 802 (H) 38 - 126 U/L   Total Bilirubin 0.9 0.3 - 1.2 mg/dL   GFR calc non Af Amer >60 >60 mL/min   GFR calc Af Amer >60 >60 mL/min    Comment: (NOTE) The eGFR has been calculated using the CKD EPI equation. This calculation has not been validated in all clinical situations. eGFR's persistently <60 mL/min signify possible Chronic Kidney Disease.    Anion gap 8 5 - 15    BP 113/61 mmHg  Pulse 95  Temp(Src) 97.5 F (36.4 C) (Oral)  Resp 17  Ht '5\' 5"'  (1.651 m)  Wt 74.9 kg (165 lb 2 oz)  BMI 27.48 kg/m2  SpO2 98%  LMP 09/11/2012 Gen NAD. Vital signs reviewed.  HEENT: Normocephalic, atraumatic Cardio: RRR and no murmur Resp: CTA B/L and unlabored GI: BS WNL, non tender Musc/Skel:  Swelling LLE, LLE TTP Neuro: Alert/Oriented Sensory to light touch intact Motor: Left hip flexion 2-/5, ankle dorsi/plantar flexion 3+ RLE hip flexion 4/5 (pain inhibition), ankle dorsi/plantar flexion 5/5 B/l UE 5/5 Skin:   Warm and dry. Wound Left hinged knee orthosis  Assessment/Plan: 1. Functional deficits secondary to  left tibial plateau fracture after a fall while at work. which require 3+ hours per day of interdisciplinary therapy in a comprehensive inpatient rehab setting. Physiatrist is providing close team supervision and 24 hour management of active medical problems listed below. Physiatrist and rehab team continue to assess barriers to discharge/monitor patient progress toward functional and medical goals. FIM: Function - Bathing Position: Shower Body parts bathed by patient: Right arm, Left arm, Chest, Abdomen, Front perineal area, Buttocks, Right upper leg, Left upper leg, Right lower leg Bathing not applicable: Left lower leg Assist Level: Supervision or verbal cues   Medical  Problem List and Plan: 1.  Weakness with gait deficits secondary to left tibial plateau fracture after a fall while at work.  Nonweightbearing 2.  DVT Prophylaxis/Anticoagulation: Subcutaneous Lovenox, full dose. For LEFT  peroneal DVT, per ortho, hypercoag due to breast Ca, prior hx DVT on right side, was on Xarelto x 18 mo  Will likely need Xarelto on D/C 3. Pain Management: Neurontin 900 mg daily at bedtime, Robaxin and oxycodone as needed.well controlled 4. Acute blood loss anemia  Hb 9.5 on 12/12  CBC ordered for 12/15 5. Neuropsych: This patient is capable of making decisions on her own behalf. 6. Skin/Wound Care: Routine skin checks 7. Fluids/Electrolytes/Nutrition: Routine i's and o's with periodic chemistries  Gluten free diet  BMP on 12/12 within acceptable range  CMP reordered for 12/15 8. Hypothyroidism. Cytomel/Synthroid 9. History of right breast cancer. Continue on arimidex, no joint pains 10.Mood/Depression. Effexor 150 mg daily. Provide emotional support 11. UTI. Macrobid d/ced due to potential hepatotoxicity.    Fosfomycin ordered 12/12 12.  Constipation: Appears resolved. 13. Thrombocytosis: Likely reactive.  Will cont to monitor  CBC ordered for 12/15 14. Elevated LFTs: Medications reviewed   Macrobid d/ced.    CMP reordered for 12/15  Function- Upper Body Dressing/Undressing What is the patient wearing?: Bra, Pull over shirt/dress Bra - Perfomed by patient: Thread/unthread right bra strap, Thread/unthread left bra strap, Hook/unhook bra (pull down sports bra) Pull over shirt/dress - Perfomed by patient: Thread/unthread right sleeve, Thread/unthread left sleeve, Put head through opening, Pull shirt over trunk Assist Level: Set up Function - Lower Body Dressing/Undressing What is the patient wearing?: Underwear, Pants, Shoes, Socks Position: Sitting EOB Underwear - Performed by patient: Thread/unthread right underwear leg, Thread/unthread left underwear leg, Pull  underwear up/down Pants- Performed by patient: Thread/unthread right pants leg, Pull pants up/down Pants- Performed by helper: Thread/unthread left pants leg Socks - Performed by patient: Don/doff right sock Shoes - Performed by patient: Don/doff right shoe Assist for footwear: Supervision/touching assist Assist for lower body dressing: Touching or steadying assistance (Pt > 75%)  Function - Toileting Toileting steps completed by patient: Adjust clothing prior to toileting, Performs perineal hygiene, Adjust clothing after toileting Toileting Assistive Devices: Grab bar or rail Assist level: Touching or steadying assistance (Pt.75%)  Function - Toilet  Transfers Toilet transfer assistive device: Elevated toilet seat/BSC over toilet Assist level to toilet: Touching or steadying assistance (Pt > 75%) Assist level from toilet: Touching or steadying assistance (Pt > 75%)  Function - Chair/bed transfer Chair/bed transfer method: Stand pivot Chair/bed transfer assist level: Maximal assist (Pt 25 - 49%/lift and lower) Chair/bed transfer assistive device: Bedrails, Armrests, Walker, Orthosis Chair/bed transfer details: Verbal cues for sequencing, Verbal cues for technique, Verbal cues for precautions/safety, Verbal cues for safe use of DME/AE, Manual facilitation for placement, Manual facilitation for weight shifting  Function - Locomotion: Wheelchair Will patient use wheelchair at discharge?: Yes Type: Manual Max wheelchair distance: 50 Assist Level: Touching or steadying assistance (Pt > 75%) Assist Level: Touching or steadying assistance (Pt > 75%) Wheel 150 feet activity did not occur: Safety/medical concerns Turns around,maneuvers to table,bed, and toilet,negotiates 3% grade,maneuvers on rugs and over doorsills: No Function - Locomotion: Ambulation Assistive device: Walker-rolling, Orthosis Max distance: 15 Assist level: Moderate assist (Pt 50 - 74%) Assist level: Moderate assist (Pt  50 - 74%) Walk 50 feet with 2 turns activity did not occur: Safety/medical concerns Walk 150 feet activity did not occur: Safety/medical concerns Walk 10 feet on uneven surfaces activity did not occur: Safety/medical concerns  Function - Comprehension Comprehension: Auditory Comprehension assist level: Follows complex conversation/direction with no assist  Function - Expression Expression: Verbal Expression assist level: Expresses complex ideas: With no assist  Function - Social Interaction Social Interaction assist level: Interacts appropriately with others - No medications needed.  Function - Problem Solving Problem solving assist level: Solves complex problems: Recognizes & self-corrects  Function - Memory Memory assist level: Complete Independence: No helper Patient normally able to recall (first 3 days only): Current season, Location of own room, Staff names and faces, That he or she is in a hospital   LOS (Days) 3 A FACE TO FACE EVALUATION WAS PERFORMED   Twila Rappa Lorie Phenix 06/27/2015, 8:24 AM

## 2015-06-27 NOTE — Progress Notes (Signed)
Social Work Assessment and Plan  Patient Details  Name: Nancy Beard MRN: [id number] Date of Birth: 12-31-1955  Today's Date: 06/27/2015  Problem List:  Patient Active Problem List   Diagnosis Date Noted  . Transaminitis 06/27/2015  . Thrombocytosis ([place]) 06/27/2015  . Iron deficiency 06/24/2015  . Bacterial UTI 06/24/2015  . Breast cancer ([place])   . UTI (urinary tract infection) 06/22/2015  . Vitamin D insufficiency 06/20/2015  . Fracture of left tibial plateau 06/15/2015  . Fracture tibia/fibula 06/14/2015  . Genital herpes 09/06/2014  . Neuropathy ([place]) 09/06/2014  . Anticoagulant long-term use 05/01/2014  . Persistent headaches 02/22/2014  . Hot flashes related to aromatase inhibitor therapy 12/10/2013  . Vulvar intraepithelial neoplasia III (VIN III) 06/30/2013  . DVT (deep venous thrombosis) ([place]) 06/30/2013  . Osteoporosis 05/13/2013  . Primary cancer of lower-inner quadrant of right female breast ([place]) 06/24/2012  . Hyperlipidemia 05/13/2008  . Hypothyroidism 01/31/2007  . Depression 01/31/2007  . Celiac disease/sprue 01/31/2007   Past Medical History:  Past Medical History  Diagnosis Date  . Thyroid disease   . Depression   . Sprue   . Breast cancer ([place])     right  . Hypothyroidism   . DVT (deep venous thrombosis) ([place]) 2014    Right lower extremity, thigh  . Wears glasses   . Pneumonia     hx of years ago  . History of chemotherapy   . Neuropathy ([facility])     of fingers and toes  . HEMORRHOIDS, EXTERNAL 05/05/2007  . DIVERTICULOSIS, COLON 05/05/2007  . Vitamin D insufficiency 06/20/2015   Past Surgical History:  Past Surgical History  Procedure Laterality Date  . Esophagogastroduodenoscopy  2007    sprue  . Colonoscopy  2006  . Cryoblation of cervix      long time ago  . Breast lumpectomy      benign lump  . Total mastectomy Bilateral 09/11/2012    Procedure: bilateral MASTECTOMY;  Surgeon: Odis Hollingshead, MD;  Location: [place];  Service:  General;  Laterality: Bilateral;  . Axillary sentinel node biopsy Right 09/11/2012    Procedure: AXILLARY SENTINEL lymph NODE  BIOPSY;  Surgeon: Odis Hollingshead, MD;  Location: [place];  Service: General;  Laterality: Right;  right nuclear medicine injection 12:30   . Breast reconstruction with placement of tissue expander and flex hd (acellular hydrated dermis) Bilateral 09/11/2012    Procedure: BREAST RECONSTRUCTION WITH PLACEMENT OF TISSUE EXPANDER AND FLEX HD (ACELLULAR HYDRATED DERMIS) ADM;  Surgeon: Theodoro Kos, DO;  Location: [place] [place];  Service: Plastics;  Laterality: Bilateral;  . Incision and drainage of wound Left 09/16/2012    Procedure: Left Breast Evacuation of Hematoma;  Surgeon: Theodoro Kos, DO;  Location: [facility];  Service: Plastics;  Laterality: Left;  . Portacath placement Right 10/09/2012    Procedure: US GUIDED INSERTION PORT-A-CATH;  Surgeon: Odis Hollingshead, MD;  Location: [place];  Service: General;  Laterality: Right;  Right Subclavian Vein  . Removal of bilateral tissue expanders with placement of bilateral breast implants Bilateral 06/24/2013    Procedure: REMOVAL OF BILATERAL TISSUE EXPANDERS WITH PLACEMENT OF BILATERAL BREAST IMPLANTS;  Surgeon: Theodoro Kos, DO;  Location: [place];  Service: Plastics;  Laterality: Bilateral;  . Vulvectomy N/A 07/14/2013    Procedure: WIDE LOCAL  EXCISION VULVAR;  Surgeon: Alvino Chapel, MD;  Location: [facility];  Service: Gynecology;  Laterality: N/A;  . Port-a-cath removal Right 08/07/2013    Procedure:  REMOVAL PORT-A-CATH;  Surgeon: Odis Hollingshead, MD;  Location: [place];  Service: General;  Laterality: Right;  . Breast reconstruction Right 09/24/2013    Procedure: REVISION OF RIGHT BREAST RECONSTRUCTION WITH REPOSITIONING RIGHT IMPLANT, POSSIBLE EXCISION CAPSULAR CONTRACTURE AND LIPOFILLING FOR FAT GRAFTING;  Surgeon: Theodoro Kos, DO;  Location: [facility];   Service: Plastics;  Laterality: Right;  . Liposuction with lipofilling Bilateral 09/24/2013    Procedure: LIPOSUCTION WITH LIPOFILLING;  Surgeon: Theodoro Kos, DO;  Location: [facility];  Service: Plastics;  Laterality: Bilateral;  Biltalteal filling breast  . External fixation leg Left 06/15/2015    Procedure: EXTERNAL FIXATION LEG;  Surgeon: Altamese Verona, MD;  Location: [place] [place];  Service: Orthopedics;  Laterality: Left;  . Orif tibia plateau Left 06/21/2015    Procedure: OPEN REDUCTION INTERNAL FIXATION (ORIF) TIBIAL PLATEAU REMOVAL OF EXTERNAL FISTULA;  Surgeon: Altamese Eastpoint, MD;  Location: [place];  Service: Orthopedics;  Laterality: Left;   Social History:  reports that she has never smoked. She has never used smokeless tobacco. She reports that she does not drink alcohol or use illicit drugs.  Family / Support Systems Marital Status: Married How Long?: 40 years Patient Roles: Spouse, Parent, Other (Comment) (employee; dtr; grandmother; niece; sister) Spouse/Significant Other: Ameila Weldon - husband - [phone]) [phone] (h);  [phone] Children: Legrand Como - adult son who is married and has a 25 year old son Other Supports: mother; sister; uncle/aunt; church Anticipated Caregiver: Pt with able-bodied husband, but he works long hours, so pt will need some assistance initially at d/c. Ability/Limitations of Caregiver: Husband works full time, often 7am-9pm Careers adviser: Evenings only Family Dynamics: Pt with a supportive family.  Sister is retired and can help some.  Social History Preferred language: English Religion: Seventh Day Adventist Cultural Background: Seventh-Day Adventist - does eat some meat Read: Yes Write: Yes Employment Status: Employed Name of Employer: [employer]ist of Employment: 7 Return to Work Plans: Pt would like to return to work as she is able. Legal History/Current Legal Issues: Pt is working with adjustor with Workers' Comp re:  this claim and whether they will be responsible or not. Guardian/Conservator: N/A - MD has determined pt is capable of making her own decisions   Abuse/Neglect Physical Abuse: Denies Verbal Abuse: Denies Sexual Abuse: Denies Exploitation of patient/patient's resources: Denies Self-Neglect: Denies  Emotional Status Pt's affect, behavior and adjustment status: Pt is very positive and motivated to rehabilitate.  She believes this happened for a reason and she trusts that will be revealed in time.  She is invested in the rehab process and feels she has already learning a lot and is making progress.  Pt relies heavily on her faith and has "trust in God" that He will see her through this. Recent Psychosocial Issues: Pt with recent breast CA in the last three years with a double masectomy and reconstruction surgeries, followed by chemotherapy.  Doing well now. Psychiatric History: none reported currently.  Noted depression in pt's past medical hx, but she did not discuss and is really well emotionally now. Substance Abuse History: none reported  Patient / Family Perceptions, Expectations & Goals Pt/Family understanding of illness & functional limitations: Pt has a good understanding of her condition and medical condition.  She is already processing how to implement what she's learned on CIR into her home routine. Premorbid pt/family roles/activities: Pt worked fulltime, but enjoys spending time with family, reading, knitting, and watching TV. Anticipated changes  in roles/activities/participation: Pt hopes to resume most activities at d/c, except working right away.  She knows she will be more sedentary for a while. Pt/family expectations/goals: Pt wants to get physically stronger and have some time to heal prior to returning home.  She also wants to learn what she can to be as independent and safe at home as possible.  She feels she is at risk of falling and wants to prevent that.  Community  [employer] Agencies: None Premorbid Home Care/DME Agencies: Other (Comment) (Pt has received some DME through her workers' comp Tourist information centre manager - agency unknown.  Has rolling walker, w/c, and bedside commode.) Transportation available at discharge: husband  Discharge Planning Living Arrangements: Spouse/significant other Support Systems: Spouse/significant other, Children, Parent, Other relatives, Friends/neighbors, Social worker community, Other (Comment) Curator - Tourist information centre manager for Workers' Comp - [phone]) Type of Residence: Private residence Administrator, sports: Multimedia programmer (specify), Nurse, mental health (specify name) ([employer] and Worker's Compsensation) Pensions consultant: Employment, Secondary school teacher Screen Referred: No Money Management: Patient, Spouse Does the patient have any problems obtaining your medications?: No Home Management: Pt and husband equally share household chores, but husband can manage these while pt is unable to help. Patient/Family Preliminary Plans: Pt plans to discharge to her home with intermittent help from husband and is planning to have a caregiver from [employer] for a little while. Barriers to Discharge: Steps - 3 to enter home without railings - thinks husband can assist Social Work Anticipated Follow Up Needs: HH/OP Expected length of stay: 7 to 10 days  Clinical Impression CSW met with pt to introduce self and role of CSW, as well as to complete assessment.  Pt with good family support and a positive attitude, who is grateful to be on CIR as a chance to get stronger and to learn strategies to be safe at home.  Pt can already see progress.  Pt's husband was working, so CSW will touch base with him via telephone and talk with pt's Workers' Therapist, occupational.  Pt has already received some DME from them, but feels she will need someone with her at home for about a week or so.  CSW to discuss this  with case Freight forwarder.  Pt will need a ramp or husband will need to be trained on bumping pt up/down 3 stairs to enter/exit the home.  CSW will continue to follow pt and assist as needed with d/c plan.  Cenia Zaragosa, Silvestre Mesi 06/27/2015, 2:02 PM

## 2015-06-27 NOTE — Progress Notes (Signed)
Physical Therapy Session Note  Patient Details  Name: Nancy Beard MRN: [id number] Date of Birth: 05/01/1956  Today's Date: 06/27/2015 PT Individual Time: 1600-1630 PT Individual Time Calculation (min): 30 min   Short Term Goals: Week 1:  PT Short Term Goal 1 (Week 1): STG=LTG due to LOS Supervision overall except stairs TBD  Skilled Therapeutic Interventions/Progress Updates:   Pt received resting in recliner and agreeable to therapy session.  Transfer from recliner to w/c with RW and supervision for ambulatory transfer.  Pt able to position self in w/c and required min assist for locking L elevating leg rest in place.  Pt propelled w/c >200 feet throughout rehab unit, in therapy apartment with focus on navigating around furniture and turning around in tight spaces (bathroom).  PT instructed patient in w/c obstacle course comprised of weaving through cones, propelling over floor mat, and turning around final post.  Pt completed obstacle course 2x with forward negotiation and 2x with backwards negotiation with mod I.  Pt returned to room at end of session and left in w/c at sink to sponge bathe with call bell in reach and needs met.    Therapy Documentation Precautions:  Precautions Precautions: Fall Required Braces or Orthoses: Other Brace/Splint Other Brace/Splint: LE brace Restrictions Weight Bearing Restrictions: Yes LLE Weight Bearing: Non weight bearing Pain: Pain Assessment Pain Assessment: No/denies pain Pain Score: 0-No pain   See Function Navigator for Current Functional Status.   Therapy/Group: Individual Therapy  Caitlin E Penven-Crew 06/27/2015, 5:17 PM  

## 2015-06-27 NOTE — Progress Notes (Signed)
Physical Therapy Session Note  Patient Details  Name: Nancy Beard MRN: [id number] Date of Birth: 08/18/1955  Today's Date: 06/27/2015 PT Individual Time: 0800-0900 PT Individual Time Calculation (min): 60 min   Short Term Goals: Week 1:  PT Short Term Goal 1 (Week 1): STG=LTG due to LOS Supervision overall except stairs TBD  Skilled Therapeutic Interventions/Progress Updates:    Pt received seated in recliner, no c/o pain and recently taken pain medication. Assisted with lower body dressing minA to thread LLE with brace through underwear and pants. Upper body dressing performed with set-up assist. Educated pt in rehab process, anticipated outcomes. Discussed home setup, and pt reports no handrails on 3 STE, pt's husband physically capable of assisting but will need to do hands-on training to get w/c in/out of house. Stand pivot transfer with RW recliner>w/c with close S. Seated in w/c at sink, pt performed teeth brushing and personal hygiene with mod I. W/c propulsion to gym with BUE, RLE with mod I. LLE brace removed for AROM. Performed heel slides, ankle pumps, hip abduction/adduction, glute sets x3 sets of 10 reps each. Returned to w/c squat pivot with armrest removed, with S. Returned to room with w/c propulsion as above; remained seated in w/c at completion of session all needs in reach.   Therapy Documentation Precautions:  Precautions Precautions: Fall Required Braces or Orthoses: Other Brace/Splint Other Brace/Splint: LE brace Restrictions Weight Bearing Restrictions: Yes LLE Weight Bearing: Non weight bearing Pain: Pain Assessment Pain Assessment: No/denies pain Pain Score: 0-No pain   See Function Navigator for Current Functional Status.   Therapy/Group: Individual Therapy  Luberta Mutter 06/27/2015, 8:50 AM

## 2015-06-27 NOTE — IPOC Note (Signed)
Overall Plan of Care [facility]) Patient Details Name: Nancy Beard MRN: [id number] DOB: 10/04/1955  Admitting Diagnosis: TIBIAL PLATEAU FX  Hospital Problems: Principal Problem:   Fracture of left tibial plateau Active Problems:   Hypothyroidism   Depression   Celiac disease/sprue   DVT (deep venous thrombosis) (HCC)   Vitamin D insufficiency   Iron deficiency   Bacterial UTI   Transaminitis   Thrombocytosis (HCC)     Functional Problem List: Nursing Bowel, Medication Management, Pain, Skin Integrity  PT Balance, Endurance, Pain  OT Balance, Endurance, Safety, Pain  SLP    TR         Basic ADL's: OT Bathing, Dressing, Toileting     Advanced  ADL's: OT Simple Meal Preparation, Laundry, Light Housekeeping     Transfers: PT Bed Mobility, Bed to Chair, Teacher, early years/pre, Tub/Shower     Locomotion: PT Ambulation, Emergency planning/management officer, Stairs     Additional Impairments: OT None  SLP        TR      Anticipated Outcomes Item Anticipated Outcome  Self Feeding Independent  Swallowing      Basic self-care  Mod I  Toileting  Mod I   Bathroom Transfers Mod I toilet; Supervision shower  Bowel/Bladder  Continent of bowel and bladder LBM 12/6  Transfers  Supervision  Locomotion  Supervision household distances  Communication     Cognition     Pain  Pain level less than 4  Safety/Judgment  Supervision   Therapy Plan: PT Intensity: Minimum of 1-2 x/day ,45 to 90 minutes PT Frequency: 5 out of 7 days PT Duration Estimated Length of Stay: 7-10 days OT Intensity: Minimum of 1-2 x/day, 45 to 90 minutes OT Frequency: 5 out of 7 days OT Duration/Estimated Length of Stay: 7-10 days         Team Interventions: Nursing Interventions Patient/Family Education, Bowel Management, Pain Management, Medication Management, Skin Care/Wound Management  PT interventions Ambulation/gait training, Balance/vestibular training, Discharge planning, DME/adaptive equipment  instruction, Functional mobility training, Neuromuscular re-education, Pain management, Patient/family education, Psychosocial support, Skin care/wound management, Splinting/orthotics, Stair training, Therapeutic Activities, Therapeutic Exercise, UE/LE Strength taining/ROM, Wheelchair propulsion/positioning  OT Interventions Training and development officer, Discharge planning, Community reintegration, Engineer, drilling, Functional mobility training, Patient/family education, Pain management, Therapeutic Activities, Therapeutic Exercise, UE/LE Strength taining/ROM, UE/LE Coordination activities, Wheelchair propulsion/positioning  SLP Interventions    TR Interventions    SW/CM Interventions Discharge Planning, Barrister's clerk, Patient/Family Education    Team Discharge Planning: Destination: PT-Home ,OT- Home , SLP-  Projected Follow-up: PT-Home health PT, 24 hour supervision/assistance, OT-  Home health OT, SLP-  Projected Equipment Needs: PT-To be determined, None recommended by PT, Other (comment) (Pt reports DME has already been delivered), OT- To be determined, SLP-  Equipment Details: PT-RW, WC, BSC, OT-Pt has BSC Patient/family involved in discharge planning: PT- Patient,  OT-Patient, SLP-   MD ELOS: 7-10 days. Medical Rehab Prognosis:  Excellent Assessment: 59 y.o. right handed female admitted 06/15/2015. Independent prior to admission living with her husband. Reported fall 06/14/2015 down approximately 3 steps while at work. Patient works at [employer] [place]. She lost her footing and fell landing on her left hip. No loss of consciousness. X-rays and imaging revealed left bicondylar tibial plateau fracture. Underwent closed reduction of left bicondylar tibial plateau fracture application of spanning external fixator 06/15/2015 per Dr. Marcelino Scot and on 06/22/2015 underwent ORIF as well as repair of lateral meniscus through an arthrotomy, anterior compartment fasciotomy and removal  of  external fixator. Nonweightbearing left lower extremity. Acute blood loss anemia 9.1 and monitored. Urine study 06/21/2015 greater than 100,000 gram-negative rods treated with abx.  Elevated LFTs, Left peroneal DVT.    See Team Conference Notes for weekly updates to the plan of care

## 2015-06-27 NOTE — Progress Notes (Signed)
Inpatient [facility] Individual Statement of Services  Patient Name:  Nancy Beard  Date:  06/27/2015  Welcome to the [facility].  Our goal is to provide you with an individualized program based on your diagnosis and situation, designed to meet your specific needs.  With this comprehensive rehabilitation program, you will be expected to participate in at least 3 hours of rehabilitation therapies Monday-Friday, with modified therapy programming on the weekends.  Your rehabilitation program will include the following services:  Physical Therapy (PT), Occupational Therapy (OT), 24 hour per day rehabilitation nursing, Case Management (Social Worker), Rehabilitation Medicine, Nutrition Services and Pharmacy Services  Weekly team conferences will be held on Wednesdays to discuss your progress.  Your Social Worker will talk with you frequently to get your input and to update you on team discussions.  Team conferences with you and your family in attendance may also be held.  Expected length of stay:  7 to 10 days  Overall anticipated outcome:  Supervision  Depending on your progress and recovery, your program may change. Your Social Worker will coordinate services and will keep you informed of any changes. Your Social Worker's name and contact numbers are listed  below.  The following services may also be recommended but are not provided by the [facility] will be made to provide these services after discharge if needed.  Arrangements include referral to agencies that provide these services.  Your insurance has been verified to be:  Workers' Health and safety inspector and [employer] Your primary doctor is:  Dr. Garret Reddish  Pertinent information will be shared with your doctor and your insurance  company.  Social Worker:  Alfonse Alpers, LCSW  [phone] or ([phone]  Information discussed with and copy given to patient by: Trey Sailors, 06/27/2015, 1:35 PM

## 2015-06-27 NOTE — Progress Notes (Signed)
Physical Therapy Session Note  Patient Details  Name: Nancy Beard MRN: [id number] Date of Birth: 13-Jan-1956  Today's Date: 06/27/2015 PT Individual Time: [id number] PT Individual Time Calculation (min): 33 min   Short Term Goals: Week 1:  PT Short Term Goal 1 (Week 1): STG=LTG due to LOS Supervision overall except stairs TBD  Skilled Therapeutic Interventions/Progress Updates:   Pt received in recliner; pt denies pain-premedicated at lunch.  Discussed with pt transportation options at D/C, home entry/exit options, equipment and home set up.  Pt reports that she performed a simulated car transfer on eval and feels that it will be best to enter the car in the back seat and keep her LLE elevated on the seat during transportation; pt may benefit from actual car transfer when husband present.  Pt transferred stand pivot recliner > w/c with min A to steady RW during sit > stand.  Performed w/c mobility on unit x 150' mod I.  In ADL apartment pt performed gait training over carpet with focus on safety and sequencing of lateral hopping and retro hopping in narrow spaces (home environment) and releasing RW reaching out of BOS for items in drawers for dynamic standing balance training with min A due to increased R lateral leaning during RW advancement.  During rest break pt also stating that her husband will not be able install a ramp and is planning on bumping her up/down stairs in a w/c.  Provided pt handout with pictures of how to perform w/c bumping up/down multiple stairs and educated pt on need for 2 people to perform safely.  Discussed with pt possibility of temporary rental ramp; will discuss option with Education officer, museum tomorrow.  Pt states she has a w/c and RW already at home that was ordered for her on acute.  Advised pt to have husband check size of w/c, if a cushion was provided and if ELR were ordered.  Pt verbalized understanding and stated her husband would be getting measurements of home  doorways.  Pt asking if she would be able to negotiate through doorways and turns in the home with ELR as well as manage ELR without assistance; advised pt to practice w/c mobility and parts management at next PT session.  At end of session pt transferred back into recliner with min A and assistance to remove L ELR.  Pt left in recliner with all items within reach and LE elevated.   Therapy Documentation Precautions:  Precautions Precautions: Fall Required Braces or Orthoses: Other Brace/Splint Other Brace/Splint: LE brace Restrictions Weight Bearing Restrictions: Yes LLE Weight Bearing: Non weight bearing Pain: Pain Assessment Pain Assessment: 0-10 Pain Score: 0-No pain Pain Type: Acute pain Pain Location: Leg Pain Orientation: Left Pain Descriptors / Indicators: Sore Pain Intervention(s): Medication (See eMAR)   See Function Navigator for Current Functional Status.   Therapy/Group: Individual Therapy  Nancy Beard [facility] 06/27/2015, 8:08 PM

## 2015-06-28 ENCOUNTER — Inpatient Hospital Stay (HOSPITAL_COMMUNITY): Payer: Worker's Compensation | Admitting: Physical Therapy

## 2015-06-28 ENCOUNTER — Inpatient Hospital Stay (HOSPITAL_COMMUNITY): Payer: Worker's Compensation

## 2015-06-28 ENCOUNTER — Inpatient Hospital Stay (HOSPITAL_COMMUNITY): Payer: Worker's Compensation | Admitting: Occupational Therapy

## 2015-06-28 DIAGNOSIS — R739 Hyperglycemia, unspecified: Secondary | ICD-10-CM

## 2015-06-28 LAB — BETA-2-GLYCOPROTEIN I ABS, IGG/M/A
Beta-2 Glyco I IgG: <9 GPI IgG units (ref 0–20)
Beta-2-Glycoprotein I IgA: <9 GPI IgA units (ref 0–25)
Beta-2-Glycoprotein I IgM: <9 GPI IgM units (ref 0–32)

## 2015-06-28 LAB — CARDIOLIPIN ANTIBODIES, IGG, IGM, IGA
Anticardiolipin IgA: <9 APL U/mL (ref 0–11)
Anticardiolipin IgG: <9 GPL U/mL (ref 0–14)
Anticardiolipin IgM: <9 MPL U/mL (ref 0–12)

## 2015-06-28 LAB — HOMOCYSTEINE: Homocysteine: 5.2 umol/L (ref 0.0–15.0)

## 2015-06-28 LAB — PROTEIN C, TOTAL: Protein C, Total: 142 % (ref 60–150)

## 2015-06-28 LAB — TISSUE TRANSGLUTAMINASE, IGA: Tissue Transglutaminase Ab, IgA: 90 U/mL — ABNORMAL HIGH (ref 0–3)

## 2015-06-28 NOTE — Progress Notes (Signed)
ANTICOAGULATION CONSULT NOTE - Initial Consult  Pharmacy Consult for enoxaparin  Indication: VTE treatment  Allergies  Allergen Reactions  . Ciprofloxacin Itching and Other (See Comments)    Patient states she had muscle spasms  . Dilaudid [Hydromorphone Hcl] Itching  . Gluten Meal Other (See Comments)    Patient Measurements: Height: 5\' 5"  (165.1 cm) Weight: 165 lb 2 oz (74.9 kg) IBW/kg (Calculated) : 57  Vital Signs: Temp: 98.6 F (37 C) (12/13 0516) Temp Source: Oral (12/13 0516) BP: 111/63 mmHg (12/13 0516) Pulse Rate: 92 (12/13 0516)  Labs:  Recent Labs  06/27/15 0444  HGB 9.5*  HCT 28.9*  PLT 570*  CREATININE 0.58    Estimated Creatinine Clearance: 75.8 mL/min (by C-G formula based on Cr of 0.58).   Assessment: 59 yo F found to have LE DVT while on prophylactic dose lovenox. Changed to full dose lovenox on 12/10. Hgb 9.5, plt 570. Scr 0.58, est. crcl ~ 75 ml/min. Likely switch to xarelto when ready to go home.  Goal of Therapy:  Anti-Xa level 0.6-1 units/ml 4hrs after LMWH dose given Monitor platelets by anticoagulation protocol: Yes   Plan:  Start enoxaparin 75mg  [place] Q12 Monitor CBC, renal function, s/s of bleed F/u plan for oral anticoagulation.

## 2015-06-28 NOTE — Progress Notes (Signed)
Physical Therapy Session Note  Patient Details  Name: Nancy Beard MRN: [id number] Date of Birth: [birth year]  Today's Date: 06/28/2015 PT Individual Time: 0905-1002 PT Individual Time Calculation (min): 57 min   Short Term Goals: Week 1:  PT Short Term Goal 1 (Week 1): STG=LTG due to LOS Supervision overall except stairs TBD  Skilled Therapeutic Interventions/Progress Updates:    Pt received resting in w/c and agreeable to therapy session.  PT assisted pt with donning LLE brace and PRAFO in w/c and pt propelled w/c mod I to therapy gym.  PT instructed patient in dynamic standing balance activity assembling/deconstructing pipe tree x2 trials with seated rest break in between set up/break down.  Pt able to construct pipe tree design with single UE support and supervision progressing to no UE support with supervision and occasional steady assist for balance.  PT instructed patient in UEB x10 min at 10 watts for UE strengthening and overall cardiopulmonary endurance.  PT adjusted pt's walker for improved efficiency and comfort with gait. PT instructed patient in gait training x35' with RW and close supervision with 1 LOB which patient recovered with steady assist from PT.  Pt requesting to propel w/c remaining distance to room and performed steady assist amb transfer to toilet at end of session. Pt left sitting on toilet and instructed to call NT for assist when finished using the bathroom.    Therapy Documentation Precautions:  Precautions Precautions: Fall Required Braces or Orthoses: Other Brace/Splint Other Brace/Splint: LE brace Restrictions Weight Bearing Restrictions: Yes LLE Weight Bearing: Non weight bearing Pain: Pain Assessment Pain Assessment: 0-10 Pain Score: 4  Pain Type: Acute pain Pain Location: Ankle Pain Orientation: Left Pain Intervention(s): RN made aware;Emotional support Multiple Pain Sites: No   See Function Navigator for Current Functional  Status.   Therapy/Group: Individual Therapy  Jakai Risse E Penven-Crew 06/28/2015, 9:45 AM

## 2015-06-28 NOTE — Progress Notes (Signed)
Social Work Patient ID: Nancy Beard, female   DOB: 10/12/1955, 59 y.o.   MRN: [id number]   CSW spoke with pt's workers' compensation case manager, Dealer, to update her on need for temporary ramp at home and that pt has some tasks that she will need supervision for.  Case manager seemed taken off guard by this, as she was told pt's goals were mod I and that she would only be here 4 to 6 days.  Explained to her that we have evaluated her further once she transferred to rehab and found her balance to be something that needed to be worked on and that she may need 7 to 10 days.  CSW stated that CSW can send therapy notes and help with the request for an extension.  CSW also faxed case manager a doctor's order for temporary ramp to be installed.  CSW updated pt on the above.  CSW to call Beth back with more information and pt's therapy schedule for tomorrow.

## 2015-06-28 NOTE — Progress Notes (Signed)
Occupational Therapy Session Note  Patient Details  Name: Nancy Beard MRN: [id number] Date of Birth: 1956/06/17  Today's Date: 06/28/2015 OT Individual Time: [id number] OT Individual Time Calculation (min): 30 min    Skilled Therapeutic Interventions/Progress Updates:    Mrs. Turowski completed transfer from bed to wheelchair with min guard assist using the RW.  Had her independently roll her wheelchair down to the ADL apartment for work on simple home management task using her RW for support.  Pt worked on making the bed using her RW.  She was able to maintain standing balance with the RW with min guard assist to complete task.  Pt needed 3 rest breaks to complete donning fitted sheet, comforter, and pillow cases on pillows.  Transferred back to the wheelchair with conclusion of activity and rolled herself back to the room.  She transferred to bed squat pivot without use of an assistive device.  Pt left with call button and phone within reach.    Therapy Documentation Precautions:  Precautions Precautions: Fall Required Braces or Orthoses: Other Brace/Splint Other Brace/Splint: LE brace Restrictions Weight Bearing Restrictions: Yes LLE Weight Bearing: Non weight bearing  Pain: Pain Assessment Pain Assessment: No/denies pain Pain Score: 0-No pain ADL: See Function Navigator for Current Functional Status.   Therapy/Group: Individual Therapy  Siddhanth Denk OTR/L 06/28/2015, 3:59 PM

## 2015-06-28 NOTE — Progress Notes (Signed)
Physical Therapy Session Note  Patient Details  Name: Nancy Beard MRN: [id number] Date of Birth: 1955/11/02  Today's Date: 06/28/2015 PT Individual Time: 1400-1430 PT Individual Time Calculation (min): 30 min   Short Term Goals: Week 1:  PT Short Term Goal 1 (Week 1): STG=LTG due to LOS Supervision overall except stairs TBD  Skilled Therapeutic Interventions/Progress Updates:    Pt received supine in bed, no c/o pain and agreeable to treatment. Pt requests to don TEDs to LLE after receiving them earlier from OT. TEDs donned to LLE totalA, and pt requests to dont RLE teds independently due to time constraints in session. Squat pivot transfer bed>w/c with S. W/c propulsion to/from therapy gym x150' with mod I. Gait with RW and CGA>close S x50' including two turns. Pt reports improved balance and stability since therapist earlier in the day elevated height. Fatigue after trial and pt unable to ambulate further. Returned to room with w/c propulsion as described above. Squat pivot transfer to bed, remained supine with all needs in reach at completion of session.   Therapy Documentation Precautions:  Precautions Precautions: Fall Required Braces or Orthoses: Other Brace/Splint Other Brace/Splint: LE brace Restrictions Weight Bearing Restrictions: Yes LLE Weight Bearing: Non weight bearing Pain: Pain Assessment Pain Assessment: No/denies pain Pain Score: 0-No pain   See Function Navigator for Current Functional Status.   Therapy/Group: Individual Therapy  Luberta Mutter 06/28/2015, 3:38 PM

## 2015-06-28 NOTE — Progress Notes (Addendum)
Occupational Therapy Session Note  Patient Details  Name: Nancy Beard MRN: [id number] Date of Birth: [birth year]  Today's Date: 06/28/2015 OT Individual Time: 0930-1030 70 Min  Short Term Goals: Week 1:  OT Short Term Goal 1 (Week 1): STG=LTG due to LOS  Skilled Therapeutic Interventions/Progress Updates: ADL-retraining at shower level with emphasis on effective use of DME (tub bench in standard tub), home safety, transfers, and adapted bathing/dressing skills.   Pt received supine in bed as meal tray arrives.   With setup to place tray at bedside table, pt completes self-feeding unassisted.   With leg brace removed and PRAFO provided, pt completes squat pivot transfer to w/c, observing post-op precautions during transfer, and is escorted to tub room to use tub bench for bathing.   OT instructs pt on use of shower barriers for wound care and LH sponge to reach left foot and back.   Pt then transfers to bench with steadying assist and bathes unassisted on tub bench.  Pt returns to w/c from tub bench with mod assist to lift and buttocks and dresses at sink sitting in w/c, performing w/c push-up to allow OT assist with donning underwear and pants.   Pt left with RN attending to change of dressing on left leg..     Therapy Documentation Precautions:  Precautions Precautions: Fall Required Braces or Orthoses: Other Brace/Splint Other Brace/Splint: LE brace Restrictions Weight Bearing Restrictions: Yes LLE Weight Bearing: Non weight bearing   Vital Signs: Therapy Vitals Temp: 98.6 F (37 C) Temp Source: Oral Pulse Rate: 92 Resp: 17 BP: 111/63 mmHg Patient Position (if appropriate): Lying Oxygen Therapy SpO2: 96 % O2 Device: Not Delivered   Pain: Pain Assessment Pain Assessment: No/denies pain Pain Onset: With Activity Pain Intervention(s): Medication (See eMAR) (prior to therapy)   See Function Navigator for Current Functional Status.   Therapy/Group: Individual  Therapy  Clint Biello 06/28/2015, 7:14 AM

## 2015-06-28 NOTE — Progress Notes (Signed)
Subjective/Complaints: Pt seen this AM sitting in her wheelchair.  Pt states she had a good night last night and was pleased to be able to get in the shower this AM.  She has a lot of gas and feels as if she is going to have a BM soon.    ROS:  Denies CP, SOB, n/v/d.  Objective: Vital Signs: No results found. Results for orders placed or performed during the hospital encounter of 06/24/15 (from the past 72 hour(s))  Antithrombin III     Status: None   Collection Time: 06/25/15  8:47 PM  Result Value Ref Range   AntiThromb III Func 102 75 - 120 %  CBC WITH DIFFERENTIAL     Status: Abnormal   Collection Time: 06/27/15  4:44 AM  Result Value Ref Range   WBC 9.7 4.0 - 10.5 K/uL   RBC 3.32 (L) 3.87 - 5.11 MIL/uL   Hemoglobin 9.5 (L) 12.0 - 15.0 g/dL   HCT 28.9 (L) 36.0 - 46.0 %   MCV 87.0 78.0 - 100.0 fL   MCH 28.6 26.0 - 34.0 pg   MCHC 32.9 30.0 - 36.0 g/dL   RDW 14.6 11.5 - 15.5 %   Platelets 570 (H) 150 - 400 K/uL   Neutrophils Relative % 47 %   Neutro Abs 4.5 1.7 - 7.7 K/uL   Lymphocytes Relative 38 %   Lymphs Abs 3.7 0.7 - 4.0 K/uL   Monocytes Relative 12 %   Monocytes Absolute 1.2 (H) 0.1 - 1.0 K/uL   Eosinophils Relative 2 %   Eosinophils Absolute 0.2 0.0 - 0.7 K/uL   Basophils Relative 1 %   Basophils Absolute 0.1 0.0 - 0.1 K/uL  Comprehensive metabolic panel     Status: Abnormal   Collection Time: 06/27/15  4:44 AM  Result Value Ref Range   Sodium 137 135 - 145 mmol/L   Potassium 3.5 3.5 - 5.1 mmol/L   Chloride 100 (L) 101 - 111 mmol/L   CO2 29 22 - 32 mmol/L   Glucose, Bld 119 (H) 65 - 99 mg/dL   BUN 6 6 - 20 mg/dL   Creatinine, Ser 0.58 0.44 - 1.00 mg/dL   Calcium 9.0 8.9 - 10.3 mg/dL   Total Protein 6.6 6.5 - 8.1 g/dL   Albumin 2.7 (L) 3.5 - 5.0 g/dL   AST 237 (H) 15 - 41 U/L   ALT 278 (H) 14 - 54 U/L   Alkaline Phosphatase 802 (H) 38 - 126 U/L   Total Bilirubin 0.9 0.3 - 1.2 mg/dL   GFR calc non Af Amer >60 >60 mL/min   GFR calc Af Amer >60 >60 mL/min   Comment: (NOTE) The eGFR has been calculated using the CKD EPI equation. This calculation has not been validated in all clinical situations. eGFR's persistently <60 mL/min signify possible Chronic Kidney Disease.    Anion gap 8 5 - 15    BP 111/63 mmHg  Pulse 92  Temp(Src) 98.6 F (37 C) (Oral)  Resp 17  Ht '5\' 5"'  (1.651 m)  Wt 74.9 kg (165 lb 2 oz)  BMI 27.48 kg/m2  SpO2 96%  LMP 09/11/2012 Gen NAD. Vital signs reviewed.  HEENT: Normocephalic, atraumatic Cardio: RRR and no murmur Resp: CTA B/L and unlabored GI: BS WNL, non tender Musc/Skel:  Swelling LLE, LLE TTP Neuro: Alert/Oriented Sensory to light touch intact Motor: Left hip flexion 3-/5, ankle dorsi/plantar flexion 3+ RLE hip flexion 5/5, ankle dorsi/plantar flexion 5/5 B/l UE 5/5 Skin:  Warm and dry. Wound left hinged knee orthosis  Assessment/Plan: 1. Functional deficits secondary to  left tibial plateau fracture after a fall while at work. which require 3+ hours per day of interdisciplinary therapy in a comprehensive inpatient rehab setting. Physiatrist is providing close team supervision and 24 hour management of active medical problems listed below. Physiatrist and rehab team continue to assess barriers to discharge/monitor patient progress toward functional and medical goals. FIM: Function - Bathing Position: Shower Body parts bathed by patient: Right arm, Left arm, Chest, Abdomen, Front perineal area, Buttocks, Right upper leg, Left upper leg, Right lower leg, Left lower leg, Back Bathing not applicable: Left lower leg Assist Level: Set up, Supervision or verbal cues   Medical Problem List and Plan: 1.  Weakness with gait deficits secondary to left tibial plateau fracture after a fall while at work.  Nonweightbearing 2.  DVT Prophylaxis/Anticoagulation: Subcutaneous Lovenox, full dose. For LEFT  peroneal DVT, per ortho, hypercoag due to breast Ca, prior hx DVT on right side, was on Xarelto x 18 mo  Will  likely need Xarelto on D/C 3. Pain Management: Neurontin 900 mg daily at bedtime, Robaxin and oxycodone as needed.well controlled 4. Acute blood loss anemia  Hb 9.5 on 12/12  CBC ordered for 12/15 5. Neuropsych: This patient is capable of making decisions on her own behalf. 6. Skin/Wound Care: Routine skin checks 7. Fluids/Electrolytes/Nutrition: Routine i's and o's with periodic chemistries  Gluten free diet, eating 100% of meals  BMP on 12/12 within acceptable range  CMP reordered for 12/15 8. Hypothyroidism. Cytomel/Synthroid 9. History of right breast cancer. Continue on arimidex, no joint pains 10.Mood/Depression. Effexor 150 mg daily. Provide emotional support 11. UTI. Macrobid d/ced due to potential hepatotoxicity.    Fosfomycin ordered 12/12 12.  Constipation: Appears resolved. 13. Thrombocytosis: Likely reactive.  Will cont to monitor  CBC ordered for 12/15 14. Elevated LFTs: Medications reviewed   Macrobid d/ced.    CMP reordered for 12/15 15. Hyperglycemia  Fasting CBG 119 12/13  HbA1c ordered for 12/15  Function- Upper Body Dressing/Undressing What is the patient wearing?: Bra, Pull over shirt/dress Bra - Perfomed by patient: Thread/unthread right bra strap, Thread/unthread left bra strap, Hook/unhook bra (pull down sports bra) Pull over shirt/dress - Perfomed by patient: Thread/unthread right sleeve, Thread/unthread left sleeve, Put head through opening, Pull shirt over trunk Assist Level: Set up Function - Lower Body Dressing/Undressing What is the patient wearing?: Underwear, Pants, Shoes, Socks Position: Sitting EOB Underwear - Performed by patient: Thread/unthread right underwear leg, Thread/unthread left underwear leg, Pull underwear up/down Pants- Performed by patient: Thread/unthread right pants leg, Pull pants up/down Pants- Performed by helper: Thread/unthread left pants leg Socks - Performed by patient: Don/doff right sock Shoes - Performed by patient:  Don/doff right shoe Assist for footwear: Supervision/touching assist Assist for lower body dressing: Touching or steadying assistance (Pt > 75%)  Function - Toileting Toileting steps completed by patient: Adjust clothing prior to toileting, Performs perineal hygiene, Adjust clothing after toileting Toileting steps completed by helper: Adjust clothing prior to toileting, Adjust clothing after toileting (per Fawn Kirk, NT report) Toileting Assistive Devices: Grab bar or rail Assist level: Touching or steadying assistance (Pt.75%)  Function - Air cabin crew transfer assistive device: Elevated toilet seat/BSC over toilet Assist level to toilet: Touching or steadying assistance (Pt > 75%) Assist level from toilet: Touching or steadying assistance (Pt > 75%)  Function - Chair/bed transfer Chair/bed transfer method: Stand pivot, Ambulatory Chair/bed transfer assist level:  Touching or steadying assistance (Pt > 75%) Chair/bed transfer assistive device: Walker, Armrests Chair/bed transfer details: Verbal cues for sequencing, Verbal cues for technique, Verbal cues for precautions/safety, Verbal cues for safe use of DME/AE  Function - Locomotion: Wheelchair Will patient use wheelchair at discharge?: Yes Type: Manual Max wheelchair distance: >200 Assist Level: No help, No cues, assistive device, takes more than reasonable amount of time Assist Level: No help, No cues, assistive device, takes more than reasonable amount of time Wheel 150 feet activity did not occur: Safety/medical concerns Assist Level: No help, No cues, assistive device, takes more than reasonable amount of time Turns around,maneuvers to table,bed, and toilet,negotiates 3% grade,maneuvers on rugs and over doorsills: Yes Function - Locomotion: Ambulation Assistive device: Walker-rolling, Orthosis Max distance: 15 Assist level: Touching or steadying assistance (Pt > 75%) Assist level: Touching or steadying assistance  (Pt > 75%) Walk 50 feet with 2 turns activity did not occur: Safety/medical concerns Walk 150 feet activity did not occur: Safety/medical concerns Walk 10 feet on uneven surfaces activity did not occur: Safety/medical concerns  Function - Comprehension Comprehension: Auditory Comprehension assist level: Follows complex conversation/direction with no assist  Function - Expression Expression: Verbal Expression assist level: Expresses complex ideas: With no assist  Function - Social Interaction Social Interaction assist level: Interacts appropriately with others with medication or extra time (anti-anxiety, antidepressant).  Function - Problem Solving Problem solving assist level: Solves complex problems: With extra time  Function - Memory Memory assist level: Complete Independence: No helper Patient normally able to recall (first 3 days only): Current season, Location of own room, Staff names and faces, That he or she is in a hospital   LOS (Days) 4 A FACE TO FACE EVALUATION WAS PERFORMED   Nancy Beard Nancy Beard 06/28/2015, 8:49 AM

## 2015-06-29 ENCOUNTER — Inpatient Hospital Stay (HOSPITAL_COMMUNITY): Payer: Worker's Compensation | Admitting: Physical Therapy

## 2015-06-29 ENCOUNTER — Inpatient Hospital Stay (HOSPITAL_COMMUNITY): Payer: Self-pay | Admitting: Occupational Therapy

## 2015-06-29 LAB — PROTHROMBIN GENE MUTATION

## 2015-06-29 LAB — PROTEIN C ACTIVITY: Protein C Activity: 167 % (ref 73–180)

## 2015-06-29 LAB — PROTEIN S, TOTAL: Protein S Ag, Total: 134 % (ref 60–150)

## 2015-06-29 LAB — LUPUS ANTICOAGULANT PANEL
DRVVT: 49.6 s — ABNORMAL HIGH (ref 0.0–44.0)
PTT Lupus Anticoagulant: 23 s (ref 0.0–40.6)

## 2015-06-29 LAB — DRVVT MIX: dRVVT Mix: 43.2 s (ref 0.0–44.0)

## 2015-06-29 LAB — PROTEIN S ACTIVITY: Protein S Activity: 64 % (ref 63–140)

## 2015-06-29 MED ORDER — RIVAROXABAN 20 MG PO TABS
20.0000 mg | ORAL_TABLET | Freq: Every day | ORAL | Status: DC
Start: 2015-07-21 — End: 2015-06-29

## 2015-06-29 MED ORDER — FE FUMARATE-B12-VIT C-FA-IFC PO CAPS
1.0000 | ORAL_CAPSULE | Freq: Three times a day (TID) | ORAL | Status: DC
  Administered 2015-06-29 – 2015-07-01 (×7): 1 via ORAL
  Filled 2015-06-29 (×9): qty 1

## 2015-06-29 MED ORDER — RIVAROXABAN 15 MG PO TABS
15.0000 mg | ORAL_TABLET | Freq: Two times a day (BID) | ORAL | Status: DC
  Filled 2015-06-29 (×2): qty 1

## 2015-06-29 NOTE — Discharge Instructions (Addendum)
Inpatient Rehab Discharge Instructions  [facility] Discharge date and time: No discharge date for patient encounter.   Activities/Precautions/ Functional Status: Activity: Nonweightbearing left lower extremity Diet: gluten-free Wound Care: keep wound clean and dry Functional status:  ___ No restrictions     ___ Walk up steps independently ___ 24/7 supervision/assistance   ___ Walk up steps with assistance ___ Intermittent supervision/assistance  ___ Bathe/dress independently ___ Walk with walker     ___ Bathe/dress with assistance ___ Walk Independently    ___ Shower independently _x__ Walk with assistance    ___ Shower with assistance ___ No alcohol     ___ Return to work/school ________  COMMUNITY REFERRALS UPON DISCHARGE:   Home Health:   PT  Agency:  [facility]  Phone:  [phone] Medical Equipment/Items Ordered:  Tub transfer bench; 3-in-1; w/c; rolling walker; ramp  Agency/Supplier:  Pt's workers' compensation case manager has arranged all of this for you through SMS. Other:  [facility]  (for care 7am-7pm)       Phone:  [phone]  Call [facility] if you have any questions regarding the above services she set up for you.  Special Instructions:    My questions have been answered and I understand these instructions. I will adhere to these goals and the provided educational materials after my discharge from the hospital.  Patient/Caregiver Signature _______________________________ Date __________  Clinician Signature _______________________________________ Date __________  Please bring this form and your medication list with you to all your follow-up doctor's appointments.     Information on my medicine - XARELTO (rivaroxaban)  This medication education was reviewed with me or my healthcare representative as part of my discharge preparation.    WHY WAS XARELTO PRESCRIBED FOR YOU? Xarelto was prescribed to treat blood clots that  may have been found in the veins of your legs (deep vein thrombosis) or in your lungs (pulmonary embolism) and to reduce the risk of them occurring again.  What do you need to know about Xarelto? The starting dose is one 15 mg tablet taken TWICE daily with food for the FIRST 21 DAYS then on 07/21/2015  the dose is changed to one 20 mg tablet taken ONCE A DAY with your evening meal.  DO NOT stop taking Xarelto without talking to the health care provider who prescribed the medication.  Refill your prescription for 20 mg tablets before you run out.  After discharge, you should have regular check-up appointments with your healthcare provider that is prescribing your Xarelto.  In the future your dose may need to be changed if your kidney function changes by a significant amount.  What do you do if you miss a dose? If you are taking Xarelto TWICE DAILY and you miss a dose, take it as soon as you remember. You may take two 15 mg tablets (total 30 mg) at the same time then resume your regularly scheduled 15 mg twice daily the next day.  If you are taking Xarelto ONCE DAILY and you miss a dose, take it as soon as you remember on the same day then continue your regularly scheduled once daily regimen the next day. Do not take two doses of Xarelto at the same time.   Important Safety Information Xarelto is a blood thinner medicine that can cause bleeding. You should call your healthcare provider right away if you experience any of the following: ? Bleeding from an injury or your nose that does not stop. ?  Unusual colored urine (red or dark brown) or unusual colored stools (red or black). ? Unusual bruising for unknown reasons. ? A serious fall or if you hit your head (even if there is no bleeding).  Some medicines may interact with Xarelto and might increase your risk of bleeding while on Xarelto. To help avoid this, consult your healthcare provider or pharmacist prior to using any new prescription  or non-prescription medications, including herbals, vitamins, non-steroidal anti-inflammatory drugs (NSAIDs) and supplements.  This website has more information on Xarelto: [email or web address]

## 2015-06-29 NOTE — Progress Notes (Signed)
Physical Therapy Session Note  Patient Details  Name: Nancy Beard MRN: [id number] Date of Birth: August 25, 1955  Today's Date: 06/29/2015 PT Individual Time: 0830-1000 Treatment Session 2: 1430-1530 PT Individual Time Calculation (min): 90 min Treatment Session 2: 60 min  Short Term Goals: Week 1:  PT Short Term Goal 1 (Week 1): STG=LTG due to LOS Supervision overall except stairs TBD  Skilled Therapeutic Interventions/Progress Updates:    Treatment Session 1: Pt received in bed - agreeable to PT session, premedicated. Therapeutic Activity - Pt requests to toilet. PT dons pt's L knee immobilizer brace and B TED hose and pt completes supine to sit transfer from flat bed without rails mod I - using UEs to assist L LE off of bed. Pt ambulates in/out of bathroom with RW and SBA for safety, transfers on/off BSC over toilet with SBA, urinates without assistance, and pt manages own clothing & hygiene. Pt washes hands and brushes teeth in stand at sink with UE support and SBA. Pt dresses upper body with set up assist and lower body with assist to place underwear and pants leg through L leg and pt pulls clothing over bottom. Pt puts make-up on and curls hair with curling iron mod I from w/c level. Pt completes a 2nd toilet transfer and this time has a bowel movement. W/C Management - Pt self propels manual w/c with B UEs 200' x 2 reps mod I. PT examines pt's personal w/c and finds it to be a good fit and PT adjusts legrest length to pt's comfort for each LE. Therapeutic Exercise - PT instructs pt in B UE strengthening exercises to improve pt's ability to compensate for L LE NWB with arms: chair push-ups from w/c, biceps curls with 5# dumbells, triceps extension with light green (level 3) Rep band, overhead press with 5# weighted bar - shoulder in neutral position to reduce risk of impingement: x 10 reps each. Pt ended up in recliner with legs elevated and pillow under L knee - all needs in reach.   Treatment  Session 2: Pt received in bed - SW and pharmacist present speaking with pt. Therapeutic Exercise - PT removes L orthotics from LE and instructs pt in the following exercises: heel slides, SLR (AAROM), supine hip abduction/adduction, ankle circles, glute sets: 2 x 10 reps. Gait Training - PT instructs pt in ambulation with RW x 60' req SBA for safety. Pt ended up in w/c while nurse tech changed pt's bed. Continue per PT POC.    Therapy Documentation Precautions:  Precautions Precautions: Fall Required Braces or Orthoses: Other Brace/Splint Other Brace/Splint: LE brace Restrictions Weight Bearing Restrictions: Yes LLE Weight Bearing: Non weight bearing Pain: Pain Assessment Pain Assessment: No/denies pain Treatment Session 2: Pt denies pain.   See Function Navigator for Current Functional Status.   Therapy/Group: Individual Therapy  [name] M 06/29/2015, 8:42 AM

## 2015-06-29 NOTE — Progress Notes (Signed)
ANTICOAGULATION CONSULT NOTE - Initial Consult  Pharmacy Consult for enoxaparin  Indication: VTE treatment  Allergies  Allergen Reactions  . Ciprofloxacin Itching and Other (See Comments)    Patient states she had muscle spasms  . Dilaudid [Hydromorphone Hcl] Itching  . Gluten Meal Other (See Comments)    Patient Measurements: Height: 5\' 5"  (165.1 cm) Weight: 165 lb 2 oz (74.9 kg) IBW/kg (Calculated) : 57  Vital Signs: Temp: 98 F (36.7 C) (12/14 0521) Temp Source: Oral (12/14 0521) BP: 121/66 mmHg (12/14 0521) Pulse Rate: 96 (12/14 0521)  Labs:  Recent Labs  06/27/15 0444  HGB 9.5*  HCT 28.9*  PLT 570*  CREATININE 0.58    Estimated Creatinine Clearance: 75.8 mL/min (by C-G formula based on Cr of 0.58).   Assessment: 59 yo F found to have LE DVT while on prophylactic dose lovenox. Changed to full dose lovenox on 12/10. Now transition to [place]. Hgb 9.5, plt 570. Scr 0.58, est. crcl ~ 75 ml/min. Last lovenox dose was this morning at 0743.  Goal of Therapy:  Monitor platelets by anticoagulation protocol: Yes   Plan:  Xarelto 15 mg po BID for 21 days then change to 20 mg daily on 07/21/2015 Monitor CBC, renal function, s/s of bleed Xarelto education with pharmacist  Maryanna Shape, PharmD, BCPS  Clinical Pharmacist  Pager: [phone]

## 2015-06-29 NOTE — Patient Care Conference (Signed)
Inpatient RehabilitationTeam Conference and Plan of Care Update Date: 06/29/2015   Time: 2:17 PM    Patient Name: Nancy Beard      Medical Record Number: [id number]  Date of Birth: 02/19/1956 Sex: Female         Room/Bed: 4M08C/4M08C-01 Payor Info: Payor: [employer] / Plan: [employer] / Product Type: *No Product type* /    Admitting Diagnosis: TIBIAL PLATEAU FX  Admit Date/Time:  06/24/2015  4:07 PM Admission Comments: No comment available   Primary Diagnosis:  Fracture of left tibial plateau Principal Problem: Fracture of left tibial plateau  Patient Active Problem List   Diagnosis Date Noted  . Transaminitis 06/27/2015  . Thrombocytosis ([place]) 06/27/2015  . Iron deficiency 06/24/2015  . Bacterial UTI 06/24/2015  . Breast cancer ([place])   . UTI (urinary tract infection) 06/22/2015  . Vitamin D insufficiency 06/20/2015  . Fracture of left tibial plateau 06/15/2015  . Fracture tibia/fibula 06/14/2015  . Genital herpes 09/06/2014  . Neuropathy ([place]) 09/06/2014  . Anticoagulant long-term use 05/01/2014  . Persistent headaches 02/22/2014  . Hot flashes related to aromatase inhibitor therapy 12/10/2013  . Vulvar intraepithelial neoplasia III (VIN III) 06/30/2013  . DVT (deep venous thrombosis) ([place]) 06/30/2013  . Osteoporosis 05/13/2013  . Primary cancer of lower-inner quadrant of right female breast ([place]) 06/24/2012  . Hyperlipidemia 05/13/2008  . Hypothyroidism 01/31/2007  . Depression 01/31/2007  . Celiac disease/sprue 01/31/2007    Expected Discharge Date: Expected Discharge Date: 07/01/15  Team Members Present: Physician leading conference: Dr. Delice Lesch Social Worker Present: Alfonse Alpers, LCSW Nurse Present: Dorien Chihuahua, RN PT Present: Guilford Shi, PT OT Present: Willeen Cass, OT PPS Coordinator present : Daiva Nakayama, RN, CRRN     Current Status/Progress Goal Weekly Team Focus  Medical   Weakness with gait deficits secondary to  left tibial plateau fracture after a fall while at work. Nonweightbearing LLE  Stabalize pain, bowel reg  see above   Bowel/Bladder   Continent of bowel and bladder; LBM 12/11; on antibiotics for UTI  Mod I  Assess and treat for constipation as needed   Swallow/Nutrition/ Hydration             ADL's   Overall Min Assist to steady during transfers and for lower body BADL  Overall Mod I for BADL, supervision for transfers and homemaking  AE training, transfers, adherence to post-op precautions, endurance, functional mobility   Mobility   Grossly Supervision  Mostly mod I, but with supervision car transfer and tot A to bump pt up/down stairs in w/c  Progressive independence in mobility, activity tolerance/endurance training, B UE strengthening/conditioning, L LE ROM   Communication             Safety/Cognition/ Behavioral Observations            Pain   C/o pain in left leg relieved by oxycodone 10mg  prn  <4  Assess and treat for pain q shift and prn   Skin   Incision to leg- covered with surgical dressing  Mod assist  Assess skin q shift and prn    Rehab Goals Patient on target to meet rehab goals: Yes Rehab Goals Revised: some goals were upgraded to modified independent *See Care Plan and progress notes for long and short-term goals.  Barriers to Discharge: Elevated LFTs, anticoagulation training    Possible Resolutions to Barriers:  New med d/ced, recheck labs, transition to oral anticoag meds    Discharge Planning/Teaching Needs:  Pt plans to go to her home with [facility] PT, 12 hours of an aide while husband is at work, and DME and ramp.  Husband to assist when he is not at work.  Pt is independent to direct her care with her caregivers.   Team Discussion:  Pt's pain is under better control, but her liver enzymes are elevated and MD is making medication changes to bring them down.  He will also transition pt to oral anticoagulant prior to d/c.  Pt will need a tub bench and  supervision with bathing.  Pt is approaching supervision with most things with PT and is on track to meet goals.  Her main barrier to going home is getting into the home.  PT recommending ramp, as bumping 4 steps will be hard on pt and the two required helpers.  OT feels pt is learning how to navigate through her ADLs.  TED hose are recommended for pt to wear continuously and pt will need caregiver to help her with this.  RN stated that pt is continent, but having some difficulties with constipation, likely due to pain medications.  May require a laxative.  Pt will need HH PT at d/c.  Revisions to Treatment Plan:  None   Continued Need for Acute Rehabilitation Level of Care: The patient requires daily medical management by a physician with specialized training in physical medicine and rehabilitation for the following conditions: Daily direction of a multidisciplinary physical rehabilitation program to ensure safe treatment while eliciting the highest outcome that is of practical value to the patient.: Yes Daily analysis of laboratory values and/or radiology reports with any subsequent need for medication adjustment of medical intervention for : Post surgical problems  Macalister Arnaud, Silvestre Mesi 06/29/2015, 2:17 PM

## 2015-06-29 NOTE — Progress Notes (Signed)
Occupational Therapy Session Note  Patient Details  Name: Nancy Beard MRN: [id number] Date of Birth: Jul 27, 1955  Today's Date: 06/29/2015 OT Individual Time: 1100-1200 OT Individual Time Calculation (min): 60 min    Short Term Goals: Week 1:  OT Short Term Goal 1 (Week 1): STG=LTG due to LOS  Skilled Therapeutic Interventions/Progress Updates:    1:1 Pt received from recliner. Pt demonstrated safe RW use to w/c at door of room. Self propelled down the hall to the ortho gym. Pt requested to practice car transfers. Pt able to perform with supervision with RW into the car but min A to get out of the car for management of left LE.  Pt ambulated over to mat and practiced doffing and donning PRAFO and Bledsoe brace in long sitting with LE fully supported. Pt able to perform it and instruct caregivers how to Assist prn. Pt ambulated to tub room and able to get in and out of bathtub with supervision using her brace to manage her leg.  Pt returned to w/c in ortho gym and then propelled to kitchen. Continued education and functional problem solving on simple meal prep at home and how to transport items. Pt also perform w/c obstacle course- navigating through tight spaces simulating a home space. Return to bed to rest at end of session with more than extra time for management of LE.   Therapy Documentation Precautions:  Precautions Precautions: Fall Required Braces or Orthoses: Other Brace/Splint Other Brace/Splint: LE brace Restrictions Weight Bearing Restrictions: Yes LLE Weight Bearing: Non weight bearing Pain:  2/10 in lower left LE  See Function Navigator for Current Functional Status.   Therapy/Group: Individual Therapy  Willeen Cass [facility] 06/29/2015, 2:34 PM

## 2015-06-29 NOTE — Progress Notes (Signed)
Subjective/Complaints: Pt seen this AM sitting in her wheelchair.  Pt is doing well.  She denies pain after recently having received her pain meds.  She denies dyuria.    ROS:  Denies Dysuria, pain, CP, SOB, n/v/d.  Objective: Vital Signs: No results found. Results for orders placed or performed during the hospital encounter of 06/24/15 (from the past 72 hour(s))  CBC WITH DIFFERENTIAL     Status: Abnormal   Collection Time: 06/27/15  4:44 AM  Result Value Ref Range   WBC 9.7 4.0 - 10.5 K/uL   RBC 3.32 (L) 3.87 - 5.11 MIL/uL   Hemoglobin 9.5 (L) 12.0 - 15.0 g/dL   HCT 28.9 (L) 36.0 - 46.0 %   MCV 87.0 78.0 - 100.0 fL   MCH 28.6 26.0 - 34.0 pg   MCHC 32.9 30.0 - 36.0 g/dL   RDW 14.6 11.5 - 15.5 %   Platelets 570 (H) 150 - 400 K/uL   Neutrophils Relative % 47 %   Neutro Abs 4.5 1.7 - 7.7 K/uL   Lymphocytes Relative 38 %   Lymphs Abs 3.7 0.7 - 4.0 K/uL   Monocytes Relative 12 %   Monocytes Absolute 1.2 (H) 0.1 - 1.0 K/uL   Eosinophils Relative 2 %   Eosinophils Absolute 0.2 0.0 - 0.7 K/uL   Basophils Relative 1 %   Basophils Absolute 0.1 0.0 - 0.1 K/uL  Comprehensive metabolic panel     Status: Abnormal   Collection Time: 06/27/15  4:44 AM  Result Value Ref Range   Sodium 137 135 - 145 mmol/L   Potassium 3.5 3.5 - 5.1 mmol/L   Chloride 100 (L) 101 - 111 mmol/L   CO2 29 22 - 32 mmol/L   Glucose, Bld 119 (H) 65 - 99 mg/dL   BUN 6 6 - 20 mg/dL   Creatinine, Ser 0.58 0.44 - 1.00 mg/dL   Calcium 9.0 8.9 - 10.3 mg/dL   Total Protein 6.6 6.5 - 8.1 g/dL   Albumin 2.7 (L) 3.5 - 5.0 g/dL   AST 237 (H) 15 - 41 U/L   ALT 278 (H) 14 - 54 U/L   Alkaline Phosphatase 802 (H) 38 - 126 U/L   Total Bilirubin 0.9 0.3 - 1.2 mg/dL   GFR calc non Af Amer >60 >60 mL/min   GFR calc Af Amer >60 >60 mL/min    Comment: (NOTE) The eGFR has been calculated using the CKD EPI equation. This calculation has not been validated in all clinical situations. eGFR's persistently <60 mL/min signify  possible Chronic Kidney Disease.    Anion gap 8 5 - 15    BP 121/66 mmHg  Pulse 96  Temp(Src) 98 F (36.7 C) (Oral)  Resp 14  Ht '5\' 5"'  (1.651 m)  Wt 74.9 kg (165 lb 2 oz)  BMI 27.48 kg/m2  SpO2 98%  LMP 09/11/2012 Gen NAD. Vital signs reviewed.  HEENT: Normocephalic, atraumatic Cardio: RRR and no murmur Resp: CTA B/L and unlabored GI: BS WNL, non tender Musc/Skel:  Swelling LLE, LLE TTP Neuro: Alert/Oriented Sensory to light touch intact Motor: Left hip flexion 3-/5, ankle dorsi/plantar flexion 4/5 RLE hip flexion 5/5, ankle dorsi/plantar flexion 5/5 B/l UE 5/5 Skin:   Warm and dry. Wound left hinged knee orthosis  Assessment/Plan: 1. Functional deficits secondary to  left tibial plateau fracture after a fall while at work. which require 3+ hours per day of interdisciplinary therapy in a comprehensive inpatient rehab setting. Physiatrist is providing close team supervision and  24 hour management of active medical problems listed below. Physiatrist and rehab team continue to assess barriers to discharge/monitor patient progress toward functional and medical goals. FIM: Function - Bathing Position: Shower Body parts bathed by patient: Right arm, Left arm, Chest, Abdomen, Front perineal area, Buttocks, Right upper leg, Left upper leg, Right lower leg, Left lower leg, Back Bathing not applicable: Left lower leg Assist Level: Set up, Supervision or verbal cues   Medical Problem List and Plan: 1.  Weakness with gait deficits secondary to left tibial plateau fracture after a fall while at work.  Nonweightbearing  Team conference today 2.  DVT Prophylaxis/Anticoagulation: Subcutaneous Lovenox, full dose. For LEFT  peroneal DVT, per ortho, hypercoag due to breast Ca, prior hx DVT on right side, was on Xarelto x 18 mo  Will likely need Xarelto on D/C 3. Pain Management: Neurontin 900 mg daily at bedtime, Robaxin and oxycodone as needed.well controlled 4. Acute blood loss  anemia  Hb 9.5 on 12/12  CBC ordered for 12/15 5. Neuropsych: This patient is capable of making decisions on her own behalf. 6. Skin/Wound Care: Routine skin checks 7. Fluids/Electrolytes/Nutrition: Routine i's and o's with periodic chemistries  Gluten free diet, eating 100% of meals  BMP on 12/12 within acceptable range  CMP reordered for 12/15 8. Hypothyroidism. Cytomel/Synthroid 9. History of right breast cancer. Continue on arimidex, no joint pains 10.Mood/Depression. Effexor 150 mg daily. Provide emotional support 11. UTI. Macrobid d/ced due to potential hepatotoxicity.    Fosfomycin ordered 12/12 12.  Constipation: Resolved. 13. Thrombocytosis: Likely reactive.  Will cont to monitor  CBC ordered for 12/15 14. Elevated LFTs: Medications reviewed   Macrobid d/ced.    CMP reordered for 12/15 15. Hyperglycemia  Fasting CBG 119 12/13  HbA1c ordered for 12/15  Function- Upper Body Dressing/Undressing What is the patient wearing?: Bra, Pull over shirt/dress Bra - Perfomed by patient: Thread/unthread right bra strap, Thread/unthread left bra strap, Hook/unhook bra (pull down sports bra) Pull over shirt/dress - Perfomed by patient: Thread/unthread right sleeve, Thread/unthread left sleeve, Put head through opening, Pull shirt over trunk Assist Level: Set up Function - Lower Body Dressing/Undressing What is the patient wearing?: Ted Hose Position: Wheelchair/chair at [employer] - Performed by patient: Thread/unthread right underwear leg Underwear - Performed by helper: Thread/unthread left underwear leg, Pull underwear up/down Pants- Performed by patient: Thread/unthread right pants leg, Thread/unthread left pants leg Pants- Performed by helper: Pull pants up/down Socks - Performed by patient: Don/doff right sock Shoes - Performed by patient: Don/doff right shoe, Fasten right TED Hose - Performed by patient: Don/doff right TED hose TED Hose - Performed by helper: Don/doff left  TED hose Assist for footwear: Maximal assist (LLE TEDs only) Assist for lower body dressing: Touching or steadying assistance (Pt > 75%) Set up : Don/doff TED stockings  Function - Toileting Toileting steps completed by patient: Adjust clothing prior to toileting (handoff to NT) Toileting steps completed by helper: Adjust clothing prior to toileting, Adjust clothing after toileting (per Fawn Kirk, NT report) Toileting Assistive Devices: Grab bar or rail Assist level: Touching or steadying assistance (Pt.75%)  Function - Air cabin crew transfer assistive device: Elevated toilet seat/BSC over toilet Assist level to toilet: Touching or steadying assistance (Pt > 75%) Assist level from toilet:  (NT handoff)  Function - Chair/bed transfer Chair/bed transfer method: Stand pivot, Ambulatory Chair/bed transfer assist level: Touching or steadying assistance (Pt > 75%) Chair/bed transfer assistive device: Walker, Armrests Chair/bed transfer details: Verbal cues  for sequencing, Verbal cues for technique, Verbal cues for precautions/safety, Verbal cues for safe use of DME/AE  Function - Locomotion: Wheelchair Will patient use wheelchair at discharge?: Yes Type: Manual Max wheelchair distance: >200 Assist Level: No help, No cues, assistive device, takes more than reasonable amount of time Assist Level: No help, No cues, assistive device, takes more than reasonable amount of time Wheel 150 feet activity did not occur: Safety/medical concerns Assist Level: No help, No cues, assistive device, takes more than reasonable amount of time Turns around,maneuvers to table,bed, and toilet,negotiates 3% grade,maneuvers on rugs and over doorsills: Yes Function - Locomotion: Ambulation Assistive device: Walker-rolling, Orthosis Max distance: 15 Assist level: Touching or steadying assistance (Pt > 75%) Assist level: Touching or steadying assistance (Pt > 75%) Walk 50 feet with 2 turns activity  did not occur: Safety/medical concerns Walk 150 feet activity did not occur: Safety/medical concerns Walk 10 feet on uneven surfaces activity did not occur: Safety/medical concerns  Function - Comprehension Comprehension: Auditory Comprehension assist level: Follows complex conversation/direction with no assist  Function - Expression Expression: Verbal Expression assist level: Expresses complex ideas: With no assist  Function - Social Interaction Social Interaction assist level: Interacts appropriately with others - No medications needed.  Function - Problem Solving Problem solving assist level: Solves complex problems: Recognizes & self-corrects  Function - Memory Memory assist level: Complete Independence: No helper Patient normally able to recall (first 3 days only): Current season, Location of own room, Staff names and faces, That he or she is in a hospital   LOS (Days) 5 A FACE TO FACE EVALUATION WAS PERFORMED   Ankit Lorie Phenix 06/29/2015, 7:59 AM

## 2015-06-30 ENCOUNTER — Other Ambulatory Visit: Payer: Self-pay | Admitting: Hematology and Oncology

## 2015-06-30 ENCOUNTER — Inpatient Hospital Stay (HOSPITAL_COMMUNITY): Payer: Worker's Compensation | Admitting: Physical Therapy

## 2015-06-30 ENCOUNTER — Inpatient Hospital Stay (HOSPITAL_COMMUNITY): Payer: Worker's Compensation

## 2015-06-30 DIAGNOSIS — I82499 Acute embolism and thrombosis of other specified deep vein of unspecified lower extremity: Secondary | ICD-10-CM

## 2015-06-30 DIAGNOSIS — R609 Edema, unspecified: Secondary | ICD-10-CM

## 2015-06-30 DIAGNOSIS — R739 Hyperglycemia, unspecified: Secondary | ICD-10-CM | POA: Insufficient documentation

## 2015-06-30 LAB — COMPREHENSIVE METABOLIC PANEL
ALT: 147 U/L — ABNORMAL HIGH (ref 14–54)
AST: 57 U/L — ABNORMAL HIGH (ref 15–41)
Albumin: 2.9 g/dL — ABNORMAL LOW (ref 3.5–5.0)
Alkaline Phosphatase: 681 U/L — ABNORMAL HIGH (ref 38–126)
Anion gap: 9 (ref 5–15)
BUN: 8 mg/dL (ref 6–20)
CO2: 29 mmol/L (ref 22–32)
Calcium: 9.4 mg/dL (ref 8.9–10.3)
Chloride: 100 mmol/L — ABNORMAL LOW (ref 101–111)
Creatinine, Ser: 0.62 mg/dL (ref 0.44–1.00)
GFR calc Af Amer: >60 mL/min (ref >60)
GFR calc non Af Amer: >60 mL/min (ref >60)
Glucose, Bld: 144 mg/dL — ABNORMAL HIGH (ref 65–99)
Potassium: 4.5 mmol/L (ref 3.5–5.1)
Sodium: 138 mmol/L (ref 135–145)
Total Bilirubin: 0.8 mg/dL (ref 0.3–1.2)
Total Protein: 7.4 g/dL (ref 6.5–8.1)

## 2015-06-30 LAB — CBC WITH DIFFERENTIAL/PLATELET
Basophils Absolute: 0 10*3/uL (ref 0.0–0.1)
Basophils Relative: 1 %
Eosinophils Absolute: 0.2 10*3/uL (ref 0.0–0.7)
Eosinophils Relative: 2 %
HCT: 32.3 % — ABNORMAL LOW (ref 36.0–46.0)
Hemoglobin: 10.3 g/dL — ABNORMAL LOW (ref 12.0–15.0)
Lymphocytes Relative: 34 %
Lymphs Abs: 3 10*3/uL (ref 0.7–4.0)
MCH: 28.7 pg (ref 26.0–34.0)
MCHC: 31.9 g/dL (ref 30.0–36.0)
MCV: 90 fL (ref 78.0–100.0)
Monocytes Absolute: 0.9 10*3/uL (ref 0.1–1.0)
Monocytes Relative: 10 %
Neutro Abs: 4.7 10*3/uL (ref 1.7–7.7)
Neutrophils Relative %: 53 %
Platelets: 683 10*3/uL — ABNORMAL HIGH (ref 150–400)
RBC: 3.59 MIL/uL — ABNORMAL LOW (ref 3.87–5.11)
RDW: 14.9 % (ref 11.5–15.5)
WBC: 8.8 10*3/uL (ref 4.0–10.5)

## 2015-06-30 NOTE — Progress Notes (Signed)
Occupational Therapy Session Note  Patient Details  Name: BRENDA-LEE SALMON MRN: [id number] Date of Birth: [birth year]  Today's Date: 06/30/2015 OT Individual Time: [id number] OT Individual Time Calculation (min): 60 min   Short Term Goals: Week 1:  OT Short Term Goal 1 (Week 1): STG=LTG due to LOS  Skilled Therapeutic Interventions/Progress Updates: ADL-retraining at shower level with focus on adherence to post-op precautions at LLE, transfers, dynamic balance, toiletiing and adapted bathing/dressing using DME.   With setup assist to apply brace and PRAFO, pt completes bed mobility to rise to edge of bed   Pt ambulates to bathroom with supervision, completes transfer to [facility] over toilet and toilets unassisted.   Pt progresses to shower level bathing with standby assist and recovers to w/c to dress upper body at sink.   Pt returns to bed to dress lower body, with preference with long sitting (legs supported fully in bed) d/t discomfort when suspending LLE while donning underwear and pants.   Pt dons brace and PRAFO in bed after setup to provide orthotics.      Therapy Documentation Precautions:  Precautions Precautions: Fall Required Braces or Orthoses: Knee Immobilizer - Left Knee Immobilizer - Left: On at all times Other Brace/Splint: Unlocked -10 to 120 degrees ROM Restrictions Weight Bearing Restrictions: Yes LLE Weight Bearing: Non weight bearing  Vital Signs: Therapy Vitals Pulse Rate: 98 BP: 119/78 mmHg Patient Position (if appropriate): Sitting Oxygen Therapy SpO2: 99 % O2 Device: Not Delivered   Pain: Pain Assessment Pain Assessment: No/denies pain See Function Navigator for Current Functional Status.   Therapy/Group: Individual Therapy  Lavida Patch 06/30/2015, 10:05 AM

## 2015-06-30 NOTE — Progress Notes (Signed)
Physical Therapy Discharge Summary  Patient Details  Name: DHANVI BOESEN MRN: [id number] Date of Birth: 01-15-1956  Today's Date: 06/30/2015 PT Individual Time: 0930-1100 Treatment Session 2: 1300-1400 PT Individual Time Calculation (min): 90 min Treatment Session 2: 60 min   Patient has met 8 of 9 long term goals due to improved activity tolerance, improved balance, increased strength, increased range of motion, decreased pain and ability to compensate for deficits.  Patient to discharge at a mod I w/c level and supervision limited ambulation level level Modified Independent.   Patient's care partner is independent to provide the necessary physical assistance at discharge.  Reasons goals not met: Dependent goal of bumping w/c up/down stairs not needed due to ramp being installed.   Recommendation:  Patient will benefit from ongoing skilled PT services in home health setting to continue to advance safe functional mobility, address ongoing impairments in L LE ROM and strength, activity tolerance, safety with upright mobility, stair training, and minimize fall risk.  Equipment: manual w/c  Reasons for discharge: treatment goals met and discharge from hospital  Patient/family agrees with progress made and goals achieved: Yes  Therapeutic Interventions: Treatment Session 1: Pt received up in w/c with nursing present, administering meds, and pt on the phone with the temporary ramp company. Therapeutic Activity - Discharge assessment initiated. Pt completes all bed mobility mod I, basic transfer with RW mod I, car transfer req supervision practiced in simulated backseat position and front seat position with RW. Gait Training - PT instructs pt in ambulation with RW x 80' req SBA - no LOB noted. W/C Management - Pt demonstrates mod I w/c propulsion with B UEs all over unit and demonstrates ability to manage legrests Independently. PT adjusts calf pad bracket to make folding/unfolding calf pad  against legrest easier and pt is very satisfied with this adjustment. Pt ended in bed to rest with orthotics removed from R LE.   Treatment Session 2: Pt received in bed - requesting to toilet. Therapeutic Activity - Pt completes squat-pivot transfer mod I to/from [facility] over toilet, manages pants & hygiene herself. PT instructs pt in picking up item from floor holding RW for support req CGA for safety. Gait Training - PT instructs pt in ambulation over compliant floor mat req min A for balance x 10'. Therapeutic Exercise - PT removes R LE orthotics and instructs pt in gentle open-chain A/AROM to R LE at mat level: heel slides, SLR, supine hip abduction/adduction, ankle circles clockwise & counterclockwise (x 20 reps each direction), and glute sets 5 seconds on/off: x 10 reps each.  Pt assisted back to bed with ice pack placed on L knee - pt educated to only keep ice on for 20 minutes, then 20 minutes off, and can repeat this cycle as long as she wants. Pt verbalizes understanding. All needs in reach of pt. Family training with husband schedule for tomorrow, in order to bump pt up/down stairs in w/c for entry into home.    PT Discharge Precautions/Restrictions Precautions Precautions: Fall Required Braces or Orthoses: Knee Immobilizer - Left Knee Immobilizer - Left: On at all times Other Brace/Splint: Unlocked -10 to 120 degrees ROM Restrictions Weight Bearing Restrictions: Yes LLE Weight Bearing: Non weight bearing Vital Signs Therapy Vitals Pulse Rate: 98 BP: 119/78 mmHg Patient Position (if appropriate): Sitting Oxygen Therapy SpO2: 99 % O2 Device: Not Delivered Pain Pain Assessment Pain Assessment: No/denies pain  Treatment Session 2: Pt c/o 4/10 pain in L LE - RN notified.  Vision/Perception  Perception Comments: appears wfl  Cognition Overall Cognitive Status: Within Functional Limits for tasks assessed Arousal/Alertness: Awake/alert Orientation Level: Oriented X4 Attention:  Focused;Sustained Focused Attention: Appears intact Sustained Attention: Appears intact Memory: Appears intact Awareness: Appears intact Problem Solving: Appears intact Safety/Judgment: Appears intact Sensation Sensation Light Touch: Appears Intact Stereognosis: Not tested Hot/Cold: Not tested Proprioception: Appears Intact Coordination Gross Motor Movements are Fluid and Coordinated: No Fine Motor Movements are Fluid and Coordinated: Not tested Coordination and Movement Description: weakness and intermittent antalgic movement due to leg brace on L LE Finger Nose Finger Test: wfl B UEs Heel Shin Test: impaired L LE due to weakness and pain Motor  Motor Motor: Within Functional Limits  Mobility Bed Mobility Bed Mobility: Rolling Right;Rolling Left;Right Sidelying to Sit;Sit to Supine Rolling Right: 6: Modified independent (Device/Increase time) Rolling Left: 6: Modified independent (Device/Increase time) Right Sidelying to Sit: 6: Modified independent (Device/Increase time) Sit to Supine: 6: Modified independent (Device/Increase time) Transfers Transfers: Yes Sit to Stand: 6: Modified independent (Device/Increase time) Stand to Sit: 6: Modified independent (Device/Increase time) Stand Pivot Transfers: 6: Modified independent (Device/Increase time) Locomotion  Ambulation Ambulation: Yes Ambulation/Gait Assistance: 5: Supervision Ambulation Distance (Feet): 80 Feet Assistive device: Rolling walker Ambulation/Gait Assistance Details: Verbal cues for precautions/safety Gait Gait: Yes Gait Pattern: Impaired Gait Pattern: Trunk flexed (NWB L LE) Gait velocity: decreased Stairs / Additional Locomotion Stairs: No (ramp to be installed) Architect: Yes Wheelchair Assistance: 6: Modified independent (Device/Increase time) Environmental health practitioner: Both upper extremities Wheelchair Parts Management: Independent Distance: 200'  Trunk/Postural  Assessment  Cervical Assessment Cervical Assessment: Within Functional Limits Thoracic Assessment Thoracic Assessment: Within Functional Limits Lumbar Assessment Lumbar Assessment: Within Functional Limits Postural Control Postural Control: Within Functional Limits  Balance Balance Balance Assessed: Yes Static Sitting Balance Static Sitting - Balance Support: No upper extremity supported;Feet supported Static Sitting - Level of Assistance: 7: Independent Dynamic Sitting Balance Dynamic Sitting - Balance Support: Left upper extremity supported;Right upper extremity supported;Feet supported Dynamic Sitting - Level of Assistance: 6: Modified independent (Device/Increase time) Static Standing Balance Static Standing - Balance Support: Bilateral upper extremity supported;During functional activity Static Standing - Level of Assistance: 6: Modified independent (Device/Increase time) Dynamic Standing Balance Dynamic Standing - Balance Support: Bilateral upper extremity supported;During functional activity Dynamic Standing - Level of Assistance: 6: Modified independent (Device/Increase time) Extremity Assessment  RUE Assessment RUE Assessment: Within Functional Limits LUE Assessment LUE Assessment: Within Functional Limits RLE Assessment RLE Assessment: Within Functional Limits LLE Assessment LLE Assessment: Exceptions to WFL LLE AROM (degrees) Overall AROM Left Lower Extremity: Deficits;Due to decreased strength LLE Overall AROM Comments: hip flexion limited 50%; knee extension limited 75%; ankle wfl LLE Strength LLE Overall Strength: Deficits;Due to pain LLE Overall Strength Comments: hip flexion 2+/5, knee extension 2+/5, hip abduction 2/5, hip adduction 2/5, ankle DF 4/5   See Function Navigator for Current Functional Status.  [facility] M 07/01/2015, 3:29 PM

## 2015-06-30 NOTE — Progress Notes (Signed)
Social Work Discharge Note  The overall goal for the admission was met for:   Discharge location: Yes - home  Length of Stay: Yes - 7 days  Discharge activity level: Yes - modified independent to supervision with some tasks  Home/community participation: Yes  Services provided included: MD, RD, PT, OT, RN, Pharmacy and SW  Financial Services: Worker's Comp  Follow-up services arranged: Home Health: Interim for PT, DME: tub transfer bench, w/c, rolling walker, 3-in-1, Other: 12 hours a day monday-friday of home care - [facility]  and Patient/Family has no preference for HH/DME agencies  Temporary ramp from [place].  Pt's workers' Therapist, occupational is arranging all of this.  Comments (or additional information):  Pt is going home to her home with her husband and workers' compensation services to assist at home.  Pt have the above care and DME.  Ramp to be installed prior to pt's d/c.  Follow up appointments are made.  Patient/Family verbalized understanding of follow-up arrangements: Yes  Individual responsible for coordination of the follow-up plan: pt with support from husband and workers' compensation  Confirmed correct DME delivered: Trey Sailors 06/30/2015    Prevatt, Silvestre Mesi

## 2015-06-30 NOTE — Discharge Summary (Signed)
Discharge summary job # [id number]

## 2015-06-30 NOTE — Progress Notes (Cosign Needed)
Social Work Patient ID: Darrall Dears, female   DOB: 03/03/1956, 59 y.o.   MRN: [id number]   CSW met with pt yesterday to update her on team conference and to give her d/c date.  Pt is pleased to be going home on Friday, but is concerned ramp won't be done by then.  CSW has made and received multiple phone calls with pt's workers' compensation Tourist information centre manager and SMS (people coordinating services).  CSW has also attempted to call Landon with [employer] to see if he could have a ramp installed quickly without success - no return call either.  CSW also attempted to speak with pt's husband to update him on pt's progress without success - no return call from voicemail.  Pt and husband left CSW a voicemail late last night for them to let me know that pt was approved to have a nurse with her for 12 hours a day for at least the first week.  Pt wanted to make sure that everything else was in place.  Pt's husband to meet Landon with [employer] today at 11:15 and Harmon Pier told them that he was concerned about how long it would take for workers' comp to approve.  CSW passed this on to case manager who will f/u to make sure that Harmon Pier knows the approval has been granted and it should not hold up the ramp.  CSW will add all necessary information to pt's discharge paperwork and will continue to remain available, as needed.

## 2015-06-30 NOTE — Progress Notes (Signed)
Subjective/Complaints: Pt seen this AM sitting up in her bed.  She had a good night last night.  Yesterday evening, she believes she ate something that upset her stomach, but that has resolved.    ROS:  Denies Dysuria, pain, CP, SOB, n/v/d.  Objective: Vital Signs: No results found. No results found for this or any previous visit (from the past 72 hour(s)).  BP 121/72 mmHg  Pulse 93  Temp(Src) 98.1 F (36.7 C) (Oral)  Resp 12  Ht 5\' 5"  (1.651 m)  Wt 74.9 kg (165 lb 2 oz)  BMI 27.48 kg/m2  SpO2 98%  LMP 09/11/2012 Gen NAD. Vital signs reviewed.  HEENT: Normocephalic, atraumatic Cardio: RRR and no murmur Resp: CTA B/L and unlabored GI: BS WNL, non tender Musc/Skel:  Swelling LLE, LLE TTP (improving) Neuro: Alert/Oriented Sensory to light touch intact Motor: Left hip flexion 3/5, ankle dorsi/plantar flexion 5/5 RLE hip flexion 5/5, ankle dorsi/plantar flexion 5/5 B/l UE 5/5 Skin:   Warm and dry. Wound left hinged knee orthosis  Assessment/Plan: 1. Functional deficits secondary to  left tibial plateau fracture after a fall while at work. which require 3+ hours per day of interdisciplinary therapy in a comprehensive inpatient rehab setting. Physiatrist is providing close team supervision and 24 hour management of active medical problems listed below. Physiatrist and rehab team continue to assess barriers to discharge/monitor patient progress toward functional and medical goals. FIM: Function - Bathing Position: Shower Body parts bathed by patient: Right arm, Left arm, Chest, Abdomen, Front perineal area, Buttocks, Right upper leg, Left upper leg, Right lower leg, Left lower leg, Back Bathing not applicable: Left lower leg Assist Level: Set up, Supervision or verbal cues   Medical Problem List and Plan: 1.  Weakness with gait deficits secondary to left tibial plateau fracture after a fall while at work.  Nonweightbearing  Team conference yesterday - pt awaiting ramp  installation  2.  DVT Prophylaxis/Anticoagulation: Subcutaneous Lovenox, full dose. For LEFT  peroneal DVT, per ortho, hypercoag due to breast Ca, prior hx DVT on right side, was on Xarelto x 18 mo  Will transition to Xarelto after reviewing labs from today 3. Pain Management: Neurontin 900 mg daily at bedtime, Robaxin and oxycodone as needed.well controlled 4. Acute blood loss anemia  Hb 9.5 on 12/12  CBC pending 5. Neuropsych: This patient is capable of making decisions on her own behalf. 6. Skin/Wound Care: Routine skin checks 7. Fluids/Electrolytes/Nutrition: Routine i's and o's with periodic chemistries  Gluten free diet, eating 100% of meals  BMP on 12/12 within acceptable range  CMP pending 8. Hypothyroidism. Cytomel/Synthroid 9. History of right breast cancer. Continue on arimidex, no joint pains 10.Mood/Depression. Effexor 150 mg daily. Provide emotional support 11. UTI. Macrobid d/ced due to potential hepatotoxicity.    Fosfomycin ordered 12/12 12.  Constipation: Resolved. 13. Thrombocytosis: Likely reactive.  Will cont to monitor  CBC pending 14. Elevated LFTs: Medications reviewed   Macrobid d/ced    CMP pending, will evaluate and make further adjustments if necessary 15. Hyperglycemia  HbA1c pending  Function- Upper Body Dressing/Undressing What is the patient wearing?: Bra, Pull over shirt/dress Bra - Perfomed by patient: Thread/unthread right bra strap, Thread/unthread left bra strap, Hook/unhook bra (pull down sports bra) Pull over shirt/dress - Perfomed by patient: Thread/unthread right sleeve, Thread/unthread left sleeve, Put head through opening, Pull shirt over trunk Assist Level: Set up Set up : To obtain clothing/put away Function - Lower Body Dressing/Undressing What is the patient wearing?: Underwear,  Pants, Shoes, [employer], AFO Position: Sitting EOB Underwear - Performed by patient: Thread/unthread right underwear leg, Pull underwear up/down Underwear -  Performed by helper: Thread/unthread left underwear leg Pants- Performed by patient: Thread/unthread right pants leg, Pull pants up/down Pants- Performed by helper: Thread/unthread left pants leg Socks - Performed by patient: Don/doff right sock Shoes - Performed by patient: Don/doff right shoe, Fasten right AFO - Performed by helper: Don/doff left AFO TED Hose - Performed by patient: Don/doff right TED hose TED Hose - Performed by helper: Don/doff right TED hose, Don/doff left TED hose Assist for footwear: Maximal assist Assist for lower body dressing: Set up Set up : Don/doff TED stockings  Function - Toileting Toileting steps completed by patient: Adjust clothing prior to toileting, Performs perineal hygiene, Adjust clothing after toileting Toileting steps completed by helper: Adjust clothing prior to toileting, Adjust clothing after toileting (per Fawn Kirk, NT report) Toileting Assistive Devices: Grab bar or rail Assist level: Supervision or verbal cues  Function - Air cabin crew transfer assistive device: Elevated toilet seat/BSC over toilet Assist level to toilet: Supervision or verbal cues Assist level from toilet: Supervision or verbal cues  Function - Chair/bed transfer Chair/bed transfer method: Stand pivot Chair/bed transfer assist level: Supervision or verbal cues Chair/bed transfer assistive device: Orthosis, Walker Chair/bed transfer details: Verbal cues for safe use of DME/AE  Function - Locomotion: Wheelchair Will patient use wheelchair at discharge?: Yes Type: Manual Max wheelchair distance: 200' Assist Level: No help, No cues, assistive device, takes more than reasonable amount of time Assist Level: No help, No cues, assistive device, takes more than reasonable amount of time Wheel 150 feet activity did not occur: Safety/medical concerns Assist Level: No help, No cues, assistive device, takes more than reasonable amount of time Turns  around,maneuvers to table,bed, and toilet,negotiates 3% grade,maneuvers on rugs and over doorsills: Yes Function - Locomotion: Ambulation Assistive device: Walker-rolling Max distance: 62' Assist level: Supervision or verbal cues Assist level: Supervision or verbal cues Walk 50 feet with 2 turns activity did not occur: Safety/medical concerns Assist level: Supervision or verbal cues Walk 150 feet activity did not occur: Safety/medical concerns Walk 10 feet on uneven surfaces activity did not occur: Safety/medical concerns  Function - Comprehension Comprehension: Auditory Comprehension assist level: Follows complex conversation/direction with no assist  Function - Expression Expression: Verbal Expression assist level: Expresses complex ideas: With no assist  Function - Social Interaction Social Interaction assist level: Interacts appropriately with others - No medications needed.  Function - Problem Solving Problem solving assist level: Solves complex problems: Recognizes & self-corrects  Function - Memory Memory assist level: Complete Independence: No helper Patient normally able to recall (first 3 days only): Current season, Location of own room, Staff names and faces, That he or she is in a hospital   LOS (Days) 6 A FACE TO FACE EVALUATION WAS PERFORMED   Sheng Pritz Lorie Phenix 06/30/2015, 6:40 AM

## 2015-06-30 NOTE — Discharge Summary (Signed)
Nancy Beard, Nancy Beard             ACCOUNT NO.:  [id number]  MEDICAL RECORD NO.:  [id number]  LOCATION:  4M08C                        FACILITY:  [place]  PHYSICIAN:  Delice Lesch, MD        DATE OF BIRTH:  1956/06/04  DATE OF ADMISSION:  06/24/2015 DATE OF DISCHARGE:  07/01/2015                              DISCHARGE SUMMARY   DISCHARGE DIAGNOSES: 1. Functional deficits secondary to left tibial plateau fracture after a fall while at work. 2. Left peroneal deep vein thrombosis. 3. Pain management. 4. Acute blood loss anemia. 5. Hypothyroidism. 6. History of breast cancer. 7. Depression. 8. Escherichia coli urinary tract infection. 9. Constipation, resolved. 10.Elevated LFTs improved 11.Thrombocytosis  12.Pre diabetic    HISTORY OF PRESENT ILLNESS:  This is a 59 year old right-handed female, independent prior to admission, lives with her husband.  Two-level home, bedroom first floor.  Reported fall on June 14, 2015, down approximately 3 steps while at work at [employer] [place].  She lost her footing, fell landing on her left hip, no loss of consciousness.  X-rays and imaging revealed left bicondylar tibial plateau fracture.  Underwent closed reduction of left bicondylar tibial plateau fracture, application of external fixator on June 15, 2015, later underwent ORIF and repair of lateral meniscus, anterior compartment fasciotomy, removal of external fixator.  Nonweightbearing left lower extremity.  Subcutaneous Lovenox for DVT prophylaxis.  Acute blood loss anemia 9.1 and monitored. Urine study on June 21, 2015, greater than 100,000 gram-negative rods, placed empirically on Macrobid with sensitivities pending. Hospital course, pain management.  The patient was admitted for comprehensive rehab program.  PAST MEDICAL HISTORY:  See discharge diagnoses.  SOCIAL HISTORY:  Lives with husband, independent prior to admission. Working at [employer] [place].  FUNCTIONAL STATUS  UPON ADMISSION TO REHAB SERVICES:  Minimal assist to ambulate 20 feet with rolling walker, minimal assist sit to stand, min to mod assist activities of daily living.  PHYSICAL EXAMINATION:  VITAL SIGNS:  Blood pressure 118/69, pulse 104, temperature 98, respirations 16. GENERAL:  This was an alert female, oriented x3. LUNGS:  Clear to auscultation without wheeze. CARDIAC:  Regular rate and rhythm without murmur. ABDOMEN:  Soft, nontender.  Good bowel sounds. LEFT LOWER EXTREMITY:  Incision site clean and dry without odor.  REHABILITATION HOSPITAL COURSE:  The patient was admitted to inpatient Rehab Services with therapies initiated on a 3-hour daily basis consisting of physical therapy, occupational therapy, and rehabilitation nursing.  The following issues were addressed during the patient's rehabilitation stay.  Pertaining to Nancy Beard' left tibial plateau fracture remained stable, she would follow up Dr. Marcelino Scot of Orthopedic Services.  Neurovascular sensation intact.  Nonweightbearing. Arrangements were made for ramp installation for home.  Routine Dopplers did reveal a left peroneal DVT.  She had been on Lovenox upon admission to [facility].  Hypercoag panels were unremarkable.  She had had a history of DVT in the past on the right side, maintained on Xarelto. She was transitioned to Xarelto and started on June 30, 2015.  Pain management use of Neurontin as well as Robaxin and oxycodone as needed. Acute blood loss anemia remained stable, maintained on iron supplement, latest hemoglobin of 10.3.  Blood pressure is well controlled.  She had some mild elevations in LFTs felt possibly related to her Macrobid that was started empirically for UTI.  She did receive one dose of fosfomycin.  LFTs greatly improved.Pre diabetic 5.7 and plan follow up with PCP.   Bouts of constipation resolved with laxative assistance.  She did continue on Synthroid for history of hypothyroidism with  noted history of breast cancer.  She continued with Arimidex.  The patient received weekly collaborative interdisciplinary team conferences to discuss estimated length of stay, family teaching, any barriers to discharge.  The patient ambulates in and out of the bathroom with rolling walker, standby assist for safety.  Transfers on and off bedside commode over toilet with standby assist.  Washes hands, brushes teeth in standing position sink side.  Propels her wheelchair modified independent.  She was able to gather her belongings for activities of daily living and homemaking, ambulating 60 feet standby assist.  Full family teaching was completed.  Plan discharge to home with ongoing therapies dictated per [facility].  DISCHARGE MEDICATIONS: 1. Arimidex 1 mg p.o. daily. 2. Calcium citrate 600 mg p.o. daily. 3. Trinsicon 1 capsule p.o. t.i.d. 4. Neurontin 900 mg p.o. at bedtime. 5. Synthroid 25 mcg p.o. daily. 6. Cytomel 12.5 mcg p.o. daily. 7. Robaxin 500 mg every 6 hours as needed. 8. Oxycodone 5-15 mg every 3 hours as needed pain, dispense of 90     tablets. 9. Protonix 40 mg p.o. daily. 10.Senokot-S 2 tablets p.o. b.i.d. 11.Effexor 150 mg p.o. daily. 12.Vitamin D 50,000 units every Thursday. 13.Xarelto as directed  DIET:  Regular consistency.  DISCHARGE INSTRUCTIONS:  She would follow up Dr. Altamese Cardwell, Orthopedic Services call for appointment.  Nonweightbearing left lower extremity.  Advised to keep wound clean and dry.  Follow up Dr. Posey Pronto, outpatient Rehab Services as needed. Patient will follow up with her PCP Dr. Rushie Chestnut in regards to thrombocytosis and prediabetic    Nancy Beard, P.A.   ______________________________ Delice Lesch, MD    DA/MEDQ  D:  06/30/2015  T:  06/30/2015  Job:  [id number]  cc:   Garret Reddish, MD Astrid Divine. Marcelino Scot, M.D.

## 2015-06-30 NOTE — Progress Notes (Signed)
Preliminary results by tech - Venous Duplex Lower Ext. Completed. Residual deep vein thrombosis in the left peroneal vein is still present with no evidence propagation of thrombus. No evidence of deep or superficial vein thrombosis in the right leg.  Oda Cogan, BS, RDMS, RVT

## 2015-06-30 NOTE — Telephone Encounter (Signed)
Reviewed with Dr. Lindi Adie.

## 2015-06-30 NOTE — Progress Notes (Signed)
Social Work Patient ID: Nancy Beard, female   DOB: 1955/09/09, 59 y.o.   MRN: [id number]   CSW spoke with pt's husband to make sure all arrangements were in place.  He will come for family education tomorrow at 3pm to learn how to bump the w/c up the stairs in case the ramp is not installed.  It does look like the ramp will be in place for pt to use at d/c.  Husband is concerned about the workers' comp not being the primary payor in our system.  CSW was able to look at pt's record and found that is the primary payor.  CSW will reassure pt of this.  CSW remains available as needed.

## 2015-07-01 ENCOUNTER — Ambulatory Visit (HOSPITAL_COMMUNITY): Payer: Self-pay | Admitting: Physical Therapy

## 2015-07-01 DIAGNOSIS — R7303 Prediabetes: Secondary | ICD-10-CM

## 2015-07-01 LAB — FACTOR 5 LEIDEN

## 2015-07-01 LAB — HEMOGLOBIN A1C
Hgb A1c MFr Bld: 5.7 % — ABNORMAL HIGH (ref 4.8–5.6)
Mean Plasma Glucose: 117 mg/dL

## 2015-07-01 MED ORDER — VITAMIN D (ERGOCALCIFEROL) 1.25 MG (50000 UNIT) PO CAPS: 50000.0000 [IU] | ORAL_CAPSULE | ORAL | Status: DC

## 2015-07-01 MED ORDER — VENLAFAXINE HCL ER 150 MG PO CP24: 150.0000 mg | ORAL_CAPSULE | Freq: Every day | ORAL | Status: DC

## 2015-07-01 MED ORDER — LEVOTHYROXINE SODIUM 25 MCG PO TABS: 25.0000 ug | ORAL_TABLET | Freq: Every day | ORAL | Status: DC

## 2015-07-01 MED ORDER — FE FUMARATE-B12-VIT C-FA-IFC PO CAPS: 1.0000 | ORAL_CAPSULE | Freq: Three times a day (TID) | ORAL | Status: DC

## 2015-07-01 MED ORDER — RIVAROXABAN 15 MG PO TABS
15.0000 mg | ORAL_TABLET | Freq: Two times a day (BID) | ORAL | Status: DC
  Administered 2015-07-01: 15 mg via ORAL
  Filled 2015-07-01 (×3): qty 1

## 2015-07-01 MED ORDER — METHOCARBAMOL 500 MG PO TABS: 500.0000 mg | ORAL_TABLET | Freq: Four times a day (QID) | ORAL | Status: DC | PRN

## 2015-07-01 MED ORDER — PANTOPRAZOLE SODIUM 40 MG PO TBEC: 40.0000 mg | DELAYED_RELEASE_TABLET | Freq: Every day | ORAL | Status: DC

## 2015-07-01 MED ORDER — GABAPENTIN 300 MG PO CAPS: 900.0000 mg | ORAL_CAPSULE | Freq: Every day | ORAL | Status: DC

## 2015-07-01 MED ORDER — SENNOSIDES-DOCUSATE SODIUM 8.6-50 MG PO TABS: 2.0000 | ORAL_TABLET | Freq: Two times a day (BID) | ORAL | Status: DC

## 2015-07-01 MED ORDER — RIVAROXABAN 15 MG PO TABS: ORAL_TABLET | ORAL | Status: DC

## 2015-07-01 MED ORDER — OXYCODONE HCL 5 MG PO TABS: 5.0000 mg | ORAL_TABLET | ORAL | Status: DC | PRN

## 2015-07-01 NOTE — Progress Notes (Signed)
Physical Therapy Session Note  Patient Details  Name: Nancy Beard MRN: [id number] Date of Birth: Dec 23, 1955  Today's Date: 07/01/2015 PT Individual Time: 1500-1515 PT Individual Time Calculation (min): 15 min   Short Term Goals: Week 1:  PT Short Term Goal 1 (Week 1): STG=LTG due to LOS Supervision overall except stairs TBD  Skilled Therapeutic Interventions/Progress Updates:    Pt received up in recliner with husband arriving shortly after PT session began. W/C Management - PT demonstrates to husband and wife how w/c works and husband/pt demonstrate understanding: locking/un-locking brakes, flipping back/forward armrests, folding/unfolding frame of w/c, flipping up/down anti-tippers, donning/doffing elevating legrests, elevating/lowering legrests. PT instructs pt and husband in moving into gym in order to practice bumping pt up/down stairs while in w/c and husband refuses, stating that he recently separated some ribs and does not want to do any heavy lifting and, besides, the ramp has been installed. PT then explained how tub transfer bench works at home and pt & husband verbalize understanding. No further PT needs indicated at this time. Pt safe to d/c home with HHPT for follow up.   Therapy Documentation Precautions:  Precautions Precautions: Fall Required Braces or Orthoses: Knee Immobilizer - Left Knee Immobilizer - Left: On at all times Other Brace/Splint: Unlocked -10 to 120 degrees ROM Restrictions Weight Bearing Restrictions: Yes LLE Weight Bearing: Non weight bearing Pain: Pain Assessment Pain Assessment: 0-10 Pain Score: 2  Pain Type: Acute pain Pain Location: Knee Pain Orientation: Left Pain Descriptors / Indicators: Aching;Sore Pain Onset: On-going Pain Intervention(s): Rest;Emotional support Multiple Pain Sites: No   See Function Navigator for Current Functional Status.   Therapy/Group: Individual Therapy  Anaiyah Anglemyer M 07/01/2015, 3:13 PM

## 2015-07-01 NOTE — Progress Notes (Signed)
Pt discharged home with husband. Discharge information previously provided. All questions answered, pt verbalized understanding. Oxy IR 10mg  given per pt's request. Pt escorted off unit in w/c with personal belonging by Lenna Sciara, NT.

## 2015-07-01 NOTE — Progress Notes (Signed)
Subjective/Complaints: Pt seen this AM sitting up in bed.  She is excited to go home today and spend the holidays with her family.    ROS:  Denies Dysuria, pain, CP, SOB, n/v/d.  Objective: Vital Signs: No results found. Results for orders placed or performed during the hospital encounter of 06/24/15 (from the past 72 hour(s))  Comprehensive metabolic panel     Status: Abnormal   Collection Time: 06/30/15  8:04 AM  Result Value Ref Range   Sodium 138 135 - 145 mmol/L   Potassium 4.5 3.5 - 5.1 mmol/L   Chloride 100 (L) 101 - 111 mmol/L   CO2 29 22 - 32 mmol/L   Glucose, Bld 144 (H) 65 - 99 mg/dL   BUN 8 6 - 20 mg/dL   Creatinine, Ser 0.62 0.44 - 1.00 mg/dL   Calcium 9.4 8.9 - 10.3 mg/dL   Total Protein 7.4 6.5 - 8.1 g/dL   Albumin 2.9 (L) 3.5 - 5.0 g/dL   AST 57 (H) 15 - 41 U/L   ALT 147 (H) 14 - 54 U/L   Alkaline Phosphatase 681 (H) 38 - 126 U/L   Total Bilirubin 0.8 0.3 - 1.2 mg/dL   GFR calc non Af Amer >60 >60 mL/min   GFR calc Af Amer >60 >60 mL/min    Comment: (NOTE) The eGFR has been calculated using the CKD EPI equation. This calculation has not been validated in all clinical situations. eGFR's persistently <60 mL/min signify possible Chronic Kidney Disease.    Anion gap 9 5 - 15  CBC with Differential/Platelet     Status: Abnormal   Collection Time: 06/30/15  8:04 AM  Result Value Ref Range   WBC 8.8 4.0 - 10.5 K/uL   RBC 3.59 (L) 3.87 - 5.11 MIL/uL   Hemoglobin 10.3 (L) 12.0 - 15.0 g/dL   HCT 32.3 (L) 36.0 - 46.0 %   MCV 90.0 78.0 - 100.0 fL   MCH 28.7 26.0 - 34.0 pg   MCHC 31.9 30.0 - 36.0 g/dL   RDW 14.9 11.5 - 15.5 %   Platelets 683 (H) 150 - 400 K/uL   Neutrophils Relative % 53 %   Neutro Abs 4.7 1.7 - 7.7 K/uL   Lymphocytes Relative 34 %   Lymphs Abs 3.0 0.7 - 4.0 K/uL   Monocytes Relative 10 %   Monocytes Absolute 0.9 0.1 - 1.0 K/uL   Eosinophils Relative 2 %   Eosinophils Absolute 0.2 0.0 - 0.7 K/uL   Basophils Relative 1 %   Basophils Absolute  0.0 0.0 - 0.1 K/uL  Hemoglobin A1c     Status: Abnormal   Collection Time: 06/30/15  8:04 AM  Result Value Ref Range   Hgb A1c MFr Bld 5.7 (H) 4.8 - 5.6 %    Comment: (NOTE)         Pre-diabetes: 5.7 - 6.4         Diabetes: >6.4         Glycemic control for adults with diabetes: <7.0    Mean Plasma Glucose 117 mg/dL    Comment: (NOTE) Performed At: [facility] [place], [place] [id number] Lindon Romp MD KN:[id number]     BP 119/68 mmHg  Pulse 90  Temp(Src) 98.6 F (37 C) (Oral)  Resp 16  Ht _0  (1.651 m)  Wt 74.9 kg (165 lb 2 oz)  BMI 27.48 kg/m2  SpO2 97%  LMP 09/11/2012 Gen NAD. Vital signs reviewed.  HEENT: Normocephalic,  atraumatic Cardio: RRR and no murmur Resp: CTA B/L and unlabored GI: BS WNL, non tender Musc/Skel:  Swelling LLE proximal 2/3rds, LLE TTP (improving) Neuro: Alert/Oriented Sensory to light touch intact Motor: Left hip flexion 3-/5, ankle dorsi/plantar flexion 5/5 RLE hip flexion 5/5, ankle dorsi/plantar flexion 5/5 B/l UE 5/5 Skin:   Warm and dry. LLE with dressing c/d/i.  Assessment/Plan: 1. Functional deficits secondary to  left tibial plateau fracture after a fall while at work. which require 3+ hours per day of interdisciplinary therapy in a comprehensive inpatient rehab setting. Physiatrist is providing close team supervision and 24 hour management of active medical problems listed below. Physiatrist and rehab team continue to assess barriers to discharge/monitor patient progress toward functional and medical goals. FIM: Function - Bathing Position: Shower Body parts bathed by patient: Right arm, Left arm, Chest, Abdomen, Front perineal area, Buttocks, Right upper leg, Left upper leg, Right lower leg, Left lower leg, Back Bathing not applicable: Left lower leg Assist Level: Set up   Medical Problem List and Plan: 1.  Weakness with gait deficits secondary to left tibial plateau fracture after a fall while at  work.  Nonweightbearing  Ramp to be placed today and pt to be d/ced late afternoon after education with husband 2.  DVT Prophylaxis/Anticoagulation: Subcutaneous Lovenox, full dose. For LEFT  peroneal DVT, per ortho, hypercoag due to breast Ca, prior hx DVT on right side, was on Xarelto x 18 mo  Transitioned to Xarelto  3. Pain Management: Neurontin 900 mg daily at bedtime, Robaxin and oxycodone as needed.well controlled 4. Acute blood loss anemia  Hb 10.3 on 12/15 5. Neuropsych: This patient is capable of making decisions on her own behalf. 6. Skin/Wound Care: Routine skin checks 7. Fluids/Electrolytes/Nutrition: Routine i's and o's with periodic chemistries  Gluten free diet, eating 100% of meals  BMP on 12/15 within acceptable range 8. Hypothyroidism. Cytomel/Synthroid 9. History of right breast cancer. Continue on arimidex, no joint pains 10.Mood/Depression. Effexor 150 mg daily. Provide emotional support 11. UTI: Resolved. Macrobid d/ced due to potential hepatotoxicity.    Fosfomycin ordered 12/12 12.  Constipation: Resolved. 13. Thrombocytosis: Likely reactive.  Will cont to monitor  Will have pt follow up with PCP 14. Elevated LFTs: Medications reviewed   Macrobid d/ced    Improved after d/cing Macrobid.   Will have pt follow up with PCP to follow to ensure continued improvement 15. Pre-diabetic  HbA1c 5.7  Will have pt follow up with PCP  Function- Upper Body Dressing/Undressing What is the patient wearing?: Bra, Pull over shirt/dress Bra - Perfomed by patient: Thread/unthread right bra strap, Thread/unthread left bra strap, Hook/unhook bra (pull down sports bra) Pull over shirt/dress - Perfomed by patient: Thread/unthread right sleeve, Thread/unthread left sleeve, Put head through opening, Pull shirt over trunk Assist Level: Set up Set up : To obtain clothing/put away Function - Lower Body Dressing/Undressing What is the patient wearing?: Underwear, Pants, Non-skid slipper  socks, Shoes Position: Bed Underwear - Performed by patient: Thread/unthread right underwear leg, Thread/unthread left underwear leg, Pull underwear up/down Underwear - Performed by helper: Thread/unthread left underwear leg Pants- Performed by patient: Thread/unthread right pants leg, Thread/unthread left pants leg, Pull pants up/down, Fasten/unfasten pants Pants- Performed by helper: Thread/unthread left pants leg Socks - Performed by patient: Don/doff right sock Shoes - Performed by patient: Don/doff right shoe, Fasten right AFO - Performed by patient: Don/doff right AFO AFO - Performed by helper: Don/doff left AFO TED Hose - Performed by patient: Don/doff  right TED hose TED Hose - Performed by helper: Don/doff right TED hose, Don/doff left TED hose Assist for footwear: Partial/moderate assist Assist for lower body dressing: Set up Set up : Don/doff TED stockings  Function - Toileting Toileting steps completed by patient: Adjust clothing prior to toileting, Performs perineal hygiene, Adjust clothing after toileting Toileting steps completed by helper: Adjust clothing prior to toileting, Adjust clothing after toileting (per Fawn Kirk, NT report) Toileting Assistive Devices: Grab bar or rail Assist level: No help/no cues  Function Midwife transfer assistive device: Elevated toilet seat/BSC over toilet, Walker, Grab bar Assist level to toilet: No Help, no cues, assistive device, takes more than a reasonable amount of time Assist level from toilet: No Help, no cues, assistive device, takes more than a reasonable amount of time  Function - Chair/bed transfer Chair/bed transfer method: Stand pivot Chair/bed transfer assist level: No Help, no cues, assistive device, takes more than a reasonable amount of time Chair/bed transfer assistive device: Walker, Armrests Chair/bed transfer details: Verbal cues for safe use of DME/AE  Function - Locomotion: Wheelchair Will  patient use wheelchair at discharge?: Yes Type: Manual Max wheelchair distance: 200' Assist Level: No help, No cues, assistive device, takes more than reasonable amount of time Assist Level: No help, No cues, assistive device, takes more than reasonable amount of time Wheel 150 feet activity did not occur: Safety/medical concerns Assist Level: No help, No cues, assistive device, takes more than reasonable amount of time Turns around,maneuvers to table,bed, and toilet,negotiates 3% grade,maneuvers on rugs and over doorsills: Yes Function - Locomotion: Ambulation Assistive device: Walker-rolling Max distance: 22' Assist level: Supervision or verbal cues Assist level: Supervision or verbal cues Walk 50 feet with 2 turns activity did not occur: Safety/medical concerns Assist level: Supervision or verbal cues Walk 150 feet activity did not occur: Safety/medical concerns Walk 10 feet on uneven surfaces activity did not occur: Safety/medical concerns Assist level: Touching or steadying assistance (Pt > 75%)  Function - Comprehension Comprehension: Auditory Comprehension assist level: Follows complex conversation/direction with no assist  Function - Expression Expression: Verbal Expression assist level: Expresses complex ideas: With no assist  Function - Social Interaction Social Interaction assist level: Interacts appropriately with others - No medications needed.  Function - Problem Solving Problem solving assist level: Solves complex problems: Recognizes & self-corrects  Function - Memory Memory assist level: Complete Independence: No helper Patient normally able to recall (first 3 days only): Current season, Location of own room, Staff names and faces, That he or she is in a hospital   LOS (Days) 7 A FACE TO FACE EVALUATION WAS PERFORMED   Ankit Lorie Phenix 07/01/2015, 9:19 AM

## 2015-07-02 NOTE — Progress Notes (Signed)
Occupational Therapy Discharge Summary  Patient Details  Name: Nancy Beard MRN: [id number] Date of Birth: 1955-09-21   Patient has met 8 of 11 long term goals due to improved activity tolerance, improved balance and ability to compensate for deficits.  Patient to discharge at overall Modified Independent level at w/c level, supervision for bathtub transfers.  Patient's care partner is independent to provide the necessary physical assistance at discharge.    Reasons goals not met: Progression with performance of iADL limited d/t fatigue and orthotics management; adequate at d/c due to supportive family and social contacts.  Recommendation:  Patient will benefit from ongoing skilled PT services in home health setting to continue to advance functional skills in the area of functional dynamic standing balance, general strength and endurance, and functional mobility required for performance of desired iADL.  Equipment: Bath bench, 3:1 (BSC)  Reasons for discharge: discharge from hospital  Patient/family agrees with progress made and goals achieved: Yes  OT Discharge Precautions/Restrictions  Precautions Precautions: Fall Required Braces or Orthoses: Knee Immobilizer - Left Knee Immobilizer - Left: On at all times Other Brace/Splint: Unlocked -10 to 120 degrees ROM Restrictions Weight Bearing Restrictions: Yes LLE Weight Bearing: Non weight bearing   Pain Pain Assessment Pain Assessment: No/denies pain   ADL ADL ADL Comments: see Functional Assessment Tool   Vision/Perception  Vision- History Baseline Vision/History: Wears glasses Wears Glasses: At all times Patient Visual Report: No change from baseline Vision- Assessment Vision Assessment?: No apparent visual deficits Perception Comments: WFL   Cognition Overall Cognitive Status: Within Functional Limits for tasks assessed Arousal/Alertness: Awake/alert Orientation Level: Oriented X4 Attention:  Alternating Alternating Attention: Appears intact Memory: Appears intact Awareness: Appears intact Problem Solving: Appears intact Safety/Judgment: Appears intact   Sensation Sensation Light Touch: Appears Intact Stereognosis: Appears Intact Hot/Cold: Appears Intact Proprioception: Appears Intact Additional Comments: WFL @ BUE Coordination Gross Motor Movements are Fluid and Coordinated: No Fine Motor Movements are Fluid and Coordinated: Yes Coordination and Movement Description: weakness and intermittent antalgic movement due to leg brace on L LE Finger Nose Finger Test: wfl B UEs Heel Shin Test: impaired L LE due to weakness and pain   Motor  Motor Motor: Within Functional Limits   Mobility  Bed Mobility Bed Mobility: Rolling Right;Rolling Left;Right Sidelying to Sit;Sit to Supine Rolling Right: 6: Modified independent (Device/Increase time) Rolling Left: 6: Modified independent (Device/Increase time) Right Sidelying to Sit: 6: Modified independent (Device/Increase time) Sit to Supine: 6: Modified independent (Device/Increase time) Transfers Transfers: Sit to Stand;Stand to Sit Sit to Stand: 6: Modified independent (Device/Increase time) Stand to Sit: 6: Modified independent (Device/Increase time)   Trunk/Postural Assessment  Cervical Assessment Cervical Assessment: Within Functional Limits Thoracic Assessment Thoracic Assessment: Within Functional Limits Lumbar Assessment Lumbar Assessment: Within Functional Limits Postural Control Postural Control: Within Functional Limits   Balance Balance Balance Assessed: Yes Static Sitting Balance Static Sitting - Balance Support: No upper extremity supported;Feet supported Static Sitting - Level of Assistance: 7: Independent Dynamic Sitting Balance Dynamic Sitting - Balance Support: Left upper extremity supported;Right upper extremity supported;Feet supported Dynamic Sitting - Level of Assistance: 6: Modified  independent (Device/Increase time) Static Standing Balance Static Standing - Balance Support: Bilateral upper extremity supported;During functional activity Static Standing - Level of Assistance: 6: Modified independent (Device/Increase time) Dynamic Standing Balance Dynamic Standing - Balance Support: Bilateral upper extremity supported;During functional activity Dynamic Standing - Level of Assistance: 6: Modified independent (Device/Increase time)   Extremity/Trunk Assessment RUE Assessment RUE Assessment: Within Functional Limits  LUE Assessment LUE Assessment: Within Functional Limits   See Function Navigator for Current Functional Status.  [place] 07/02/2015, 7:08 AM

## 2015-07-05 NOTE — Care Management (Signed)
Case manager received a request for review information  From [employer]. Case manager left voice messages on 12/14 and 06/30/2015.  CM received a call back from [place] on 07/05/15 ,  CM provided her with patient's discharge date and informed her that patient was admitted as worker's comp.

## 2015-07-05 NOTE — Assessment & Plan Note (Signed)
Right breast invasive ductal carcinoma with DCIS, grade 3, ER 2%, PR 0%, HER-2 negative, Ki-67 34%, status post bilateral mastectomies and reconstruction, adjuvant chemotherapy with before meals 4 followed by [name] Taxol 12, antiestrogen therapy with Arimidex started 05/19/2013 eventhough she has only 2% ER positivity.  Arimidex toxicities: 1. Hot flashes: For which she is taking Effexor and Neurontin benefit 2. Muscle aches and pains  Breast cancer surveillance: No role of Coumadin since she had bilateral mastectomies and breast examination chest wall axilla 09/28/2014 is normal  DVT right leg: U/S Leg 12/15/14: Chronic deep vein thrombosis noted in the right popliteal vein. Appears unchanged from studies on 09/28/14, 03/23/14, and 11/10/13 We discontinued xarelto.   Leg cramps: Gabapentin to 300 three times a day and on Effexor 75 mg daily b Hospitalization 06/24/15 to 07/01/15 (left tibia fracture), Left peroneal DVT  RTC 1 year

## 2015-07-06 ENCOUNTER — Encounter: Payer: Self-pay | Admitting: Hematology and Oncology

## 2015-07-06 ENCOUNTER — Ambulatory Visit (HOSPITAL_BASED_OUTPATIENT_CLINIC_OR_DEPARTMENT_OTHER): Payer: Worker's Compensation | Admitting: Hematology and Oncology

## 2015-07-06 VITALS — BP 116/71 | HR 116 | Temp 98.1°F | Resp 18

## 2015-07-06 DIAGNOSIS — I82492 Acute embolism and thrombosis of other specified deep vein of left lower extremity: Secondary | ICD-10-CM

## 2015-07-06 DIAGNOSIS — I82432 Acute embolism and thrombosis of left popliteal vein: Secondary | ICD-10-CM

## 2015-07-06 DIAGNOSIS — I82591 Chronic embolism and thrombosis of other specified deep vein of right lower extremity: Secondary | ICD-10-CM

## 2015-07-06 DIAGNOSIS — M810 Age-related osteoporosis without current pathological fracture: Secondary | ICD-10-CM

## 2015-07-06 DIAGNOSIS — Z7901 Long term (current) use of anticoagulants: Secondary | ICD-10-CM | POA: Diagnosis not present

## 2015-07-06 DIAGNOSIS — C50311 Malignant neoplasm of lower-inner quadrant of right female breast: Secondary | ICD-10-CM | POA: Diagnosis not present

## 2015-07-06 MED ORDER — RIVAROXABAN 20 MG PO TABS: 20.0000 mg | ORAL_TABLET | Freq: Every day | ORAL | Status: DC

## 2015-07-06 MED ORDER — PROCHLORPERAZINE MALEATE 10 MG PO TABS: 10.0000 mg | ORAL_TABLET | Freq: Four times a day (QID) | ORAL | Status: DC | PRN

## 2015-07-06 NOTE — Progress Notes (Signed)
Patient Care Team: Marin Olp, MD as PCP - General (Family Medicine)  DIAGNOSIS: Primary cancer of lower-inner quadrant of right female breast [facility])   Staging form: Breast, AJCC 7th Edition     Clinical: Stage IA (T1c, N0, cM0) - Unsigned       Staging comments: Staged at breast conference 12.18.13      Pathologic: No stage assigned - Unsigned   SUMMARY OF ONCOLOGIC HISTORY:   Primary cancer of lower-inner quadrant of right female breast ([employer])   06/18/2012 Mammogram screening mammogram in 06/2012 that showed a spiculated mass in the lower inner quadrant of the right breast. She had ultrasound performed that showed irregular mass at the 5:00 o'clock position 3 cm from the nipple measuring 1.1 cm.   06/19/2012 Initial Biopsy invasive ductal carcinoma with DCIS, grade 3 tumor that was ER 2% PR negative HER-2/neu negative. Ki-67 was elevated at 34%   06/24/2012 Breast MRI middle third of the lower inner quadrant of the right breast a 1.5 x 1.3 x 1.9 cm irregular enhancing mass.    06/24/2012 Initial Diagnosis Cancer of lower-inner quadrant of female breast   09/11/2012 Surgery Bilateral mastectomies   10/15/2012 - 03/03/2013 Chemotherapy AC X 4 followed by Carbo-Taxol weekly X 12   03/30/2013 Procedure DVT Right leg on Xarelto   05/19/2013 - 07/06/2015 Anti-estrogen oral therapy Arimidex discontinued because of  left leg fracture (with a history of osteoporosis)   06/24/2015 Imaging Left peroneal vein DVT when she developed left tibia and fibular fracture    CHIEF COMPLIANT: Follow-up after recent hospitalization for left leg fracture and new DVT  INTERVAL HISTORY: Nancy Beard is a 59 year old-history of right breast cancer who was previously on Arimidex therapy. She also had osteoporosis for which she was taking bisphosphonates. Previously she had DVT of the right leg and was on Xarelto for one and half year. We finally discontinued it after serial ultrasounds did not show any  worsening of the DVT. She was recently hospitalized after a fall and broke her tibia and fibula. She had extensive surgery performed and incidentally was found to have a left peroneal vein DVT. She was started back on Xarelto and asked to follow-up with [place] today. She is still recovering from it and is on no weightbearing.  REVIEW OF SYSTEMS:   Constitutional: Denies fevers, chills or abnormal weight loss Eyes: Denies blurriness of vision Ears, nose, mouth, throat, and face: Denies mucositis or sore throat Respiratory: Denies cough, dyspnea or wheezes Cardiovascular: Denies palpitation, chest discomfort or lower extremity swelling Gastrointestinal:  Denies nausea, heartburn or change in bowel habits Skin: Denies abnormal skin rashes Lymphatics: Denies new lymphadenopathy or easy bruising Neurological:Denies numbness, tingling or new weaknesses Behavioral/Psych: Mood is stable, no new changes  Extremities: Left leg in cast from recent fracture immobilized All other systems were reviewed with the patient and are negative.  I have reviewed the past medical history, past surgical history, social history and family history with the patient and they are unchanged from previous note.  ALLERGIES:  is allergic to ciprofloxacin; macrobid; dilaudid; and gluten meal.  MEDICATIONS:  Current Outpatient Prescriptions  Medication Sig Dispense Refill  . anastrozole (ARIMIDEX) 1 MG tablet TAKE 1 TABLET BY MOUTH EVERY DAY (Patient taking differently: TAKE 1 MG BY MOUTH EVERY DAY) 90 tablet 1  . Cholecalciferol (VITAMIN D-3 PO) Take 1 tablet by mouth daily.     . ferrous [id number] C-folic acid (TRINSICON / FOLTRIN) capsule Take 1 capsule by  mouth 3 (three) times daily after meals. 90 capsule 0  . gabapentin (NEURONTIN) 300 MG capsule Take 3 capsules (900 mg total) by mouth at bedtime. 30 capsule 1  . levothyroxine (SYNTHROID, LEVOTHROID) 25 MCG tablet Take 1 tablet (25 mcg total) by mouth daily. 90  tablet 3  . liothyronine (CYTOMEL) 25 MCG tablet Take 0.5 tablets (12.5 mcg total) by mouth daily. 45 tablet 3  . methocarbamol (ROBAXIN) 500 MG tablet Take 1 tablet (500 mg total) by mouth every 6 (six) hours as needed for muscle spasms. 90 tablet 0  . Multiple Vitamin (MULTIVITAMIN) tablet Take 1 tablet by mouth daily.    [name] oxyCODONE (OXY IR/ROXICODONE) 5 MG immediate release tablet Take 1-3 tablets (5-15 mg total) by mouth every 3 (three) hours as needed for moderate pain or breakthrough pain. 90 tablet 0  . pantoprazole (PROTONIX) 40 MG tablet Take 1 tablet (40 mg total) by mouth daily. 30 tablet 1  . prochlorperazine (COMPAZINE) 10 MG tablet Take 1 tablet (10 mg total) by mouth every 6 (six) hours as needed. 30 tablet 3  . Rivaroxaban (XARELTO) 15 MG TABS tablet 15 mg twice daily until 07/21/2015 and begin 20 mg daily 90 tablet 1  . [START ON 07/21/2015] rivaroxaban (XARELTO) 20 MG TABS tablet Take 1 tablet (20 mg total) by mouth daily with supper. 30 tablet 11  . senna-docusate (SENOKOT-S) 8.6-50 MG tablet Take 2 tablets by mouth 2 (two) times daily.    [name] venlafaxine XR (EFFEXOR-XR) 150 MG 24 hr capsule Take 1 capsule (150 mg total) by mouth daily with breakfast. 90 capsule 1  . Vitamin D, Ergocalciferol, (DRISDOL) 50000 UNITS CAPS capsule Take 1 capsule (50,000 Units total) by mouth every Thursday. 30 capsule 1   No current facility-administered medications for this visit.    PHYSICAL EXAMINATION: ECOG PERFORMANCE STATUS: 1 - Symptomatic but completely ambulatory  Filed Vitals:   07/06/15 1546  BP: 116/71  Pulse: 116  Temp: 98.1 F (36.7 C)  Resp: 18   Filed Weights    GENERAL:alert, no distress and comfortable SKIN: skin color, texture, turgor are normal, no rashes or significant lesions EYES: normal, Conjunctiva are pink and non-injected, sclera clear OROPHARYNX:no exudate, no erythema and lips, buccal mucosa, and tongue normal  NECK: supple, thyroid normal size, non-tender,  without nodularity LYMPH:  no palpable lymphadenopathy in the cervical, axillary or inguinal LUNGS: clear to auscultation and percussion with normal breathing effort HEART: regular rate & rhythm and no murmurs and no lower extremity edema ABDOMEN:abdomen soft, non-tender and normal bowel sounds Musculoskeletal:no cyanosis of digits and no clubbing  NEURO: alert & oriented x 3 with fluent speech, no focal motor/sensory deficits  LABORATORY DATA:  I have reviewed the data as listed   Chemistry      Component Value Date/Time   NA 138 06/30/2015 0804   NA 140 01/10/2015 1433   K 4.5 06/30/2015 0804   K 4.1 01/10/2015 1433   CL 100* 06/30/2015 0804   CL 105 01/06/2013 0827   CO2 29 06/30/2015 0804   CO2 27 01/10/2015 1433   BUN 8 06/30/2015 0804   BUN 11.0 01/10/2015 1433   CREATININE 0.62 06/30/2015 0804   CREATININE 0.7 01/10/2015 1433      Component Value Date/Time   CALCIUM 9.4 06/30/2015 0804   CALCIUM 8.6* 06/15/2015 1027   CALCIUM 9.9 01/10/2015 1433   ALKPHOS 681* 06/30/2015 0804   ALKPHOS 172* 01/10/2015 1433   AST 57* 06/30/2015 0804  AST 97* 01/10/2015 1433   ALT 147* 06/30/2015 0804   ALT 127* 01/10/2015 1433   BILITOT 0.8 06/30/2015 0804   BILITOT 0.36 01/10/2015 1433       Lab Results  Component Value Date   WBC 8.8 06/30/2015   HGB 10.3* 06/30/2015   HCT 32.3* 06/30/2015   MCV 90.0 06/30/2015   PLT 683* 06/30/2015   NEUTROABS 4.7 06/30/2015    ASSESSMENT & PLAN:  Primary cancer of lower-inner quadrant of right female breast Right breast invasive ductal carcinoma with DCIS, grade 3, ER 2%, PR 0%, HER-2 negative, Ki-67 34%, status post bilateral mastectomies and reconstruction, adjuvant chemotherapy with before meals 4 followed by [name] Taxol 12, antiestrogen therapy with Arimidex started 05/19/2013 eventhough she has only 2% ER positivity stopped 07/06/2015  Arimidex toxicities: 1. Hot flashes: For which she is taking Effexor and Neurontin  benefit 2. Muscle aches and pains 3. Osteoporosis: Patient is on Fosamax with calcium and vitamin D Based upon the recent tibia and fibular fracture, I discontinued anastrozole therapy. Because the risks of further osteoporotic fractures are far outweighed any benefits from antiestrogen treatment because she was only 2% ER positive.  DVT right leg: U/S Leg 12/15/14: Chronic deep vein thrombosis noted in the right popliteal vein. Appeared unchanged from studies on 09/28/14, 03/23/14, and 11/10/13 We discontinued xarelto .  New DVT of left peroneal vein colon from recent fracture: Currently on xarelto 15 by mouth twice a day. January 5 she'll be changing to 20 mg once a day. I sent a prescription today. Apparently this should be billed to worker's compensation [employer] ([place] [phone] or office No [phone] or email [email or web address])  Leg cramps: Gabapentin to 300 three times a day and on Effexor 75 mg daily b Hospitalization 06/24/15 to 07/01/15 (left tibia fracture), Left peroneal DVT Iron deficiency anemia: On oral iron therapy hemoglobin appears to be improving slowly  RTC 3 months for follow-up.   No orders of the defined types were placed in this encounter.   The patient has a good understanding of the overall plan. she agrees with it. she will call with any problems that may develop before the next visit here.   Rulon Eisenmenger, MD 07/06/2015

## 2015-07-07 ENCOUNTER — Encounter: Payer: Self-pay | Admitting: *Deleted

## 2015-07-22 ENCOUNTER — Other Ambulatory Visit: Payer: Self-pay | Admitting: *Deleted

## 2015-07-22 MED ORDER — METHOCARBAMOL 500 MG PO TABS: 500.0000 mg | ORAL_TABLET | Freq: Four times a day (QID) | ORAL | Status: DC | PRN

## 2015-07-23 ENCOUNTER — Other Ambulatory Visit: Payer: Self-pay | Admitting: Family Medicine

## 2015-08-04 ENCOUNTER — Ambulatory Visit (INDEPENDENT_AMBULATORY_CARE_PROVIDER_SITE_OTHER): Payer: BLUE CROSS/BLUE SHIELD | Admitting: Family Medicine

## 2015-08-04 ENCOUNTER — Encounter: Payer: Self-pay | Admitting: Family Medicine

## 2015-08-04 ENCOUNTER — Telehealth: Payer: Self-pay | Admitting: Family Medicine

## 2015-08-04 VITALS — BP 116/70 | HR 108 | Temp 98.2°F | Wt 160.0 lb

## 2015-08-04 DIAGNOSIS — R350 Frequency of micturition: Secondary | ICD-10-CM | POA: Diagnosis not present

## 2015-08-04 DIAGNOSIS — R3915 Urgency of urination: Secondary | ICD-10-CM | POA: Diagnosis not present

## 2015-08-04 LAB — POCT URINALYSIS DIPSTICK
Bilirubin, UA: NEGATIVE
Glucose, UA: NEGATIVE
Ketones, UA: NEGATIVE
Leukocytes, UA: NEGATIVE
Nitrite, UA: NEGATIVE
Protein, UA: NEGATIVE
Spec Grav, UA: >=1.03
Urobilinogen, UA: 0.2
pH, UA: 5

## 2015-08-04 NOTE — Telephone Encounter (Signed)
Patient would like to know if the doctor would call something into the pharmacy for a UTI

## 2015-08-04 NOTE — Telephone Encounter (Signed)
Pt will need OV.

## 2015-08-04 NOTE — Telephone Encounter (Signed)
Made patient an appt for today at 1:30pm

## 2015-08-04 NOTE — Patient Instructions (Signed)
UTI not clear from urine sample- we will send for culture to rule in or rule out.   I'll send you a mychart message about results

## 2015-08-04 NOTE — Progress Notes (Signed)
Garret Reddish, MD  Subjective:  Nancy Beard is a 60 y.o. year old very pleasant female patient who presents for/with See problem oriented charting ROS- no vaginal discharge. No fever/chills/nausea/vomiting/suprapubic tenderness  Past Medical History-  Patient Active Problem List   Diagnosis Date Noted  . DVT (deep venous thrombosis) ([place]) 06/30/2013    Priority: High  . Primary cancer of lower-inner quadrant of right female breast ([place]) 06/24/2012    Priority: High  . Genital herpes 09/06/2014    Priority: Medium  . Vulvar intraepithelial neoplasia III (VIN III) 06/30/2013    Priority: Medium  . Osteoporosis 05/13/2013    Priority: Medium  . Hyperlipidemia 05/13/2008    Priority: Medium  . Hypothyroidism 01/31/2007    Priority: Medium  . Depression 01/31/2007    Priority: Medium  . Celiac disease/sprue 01/31/2007    Priority: Medium  . Neuropathy ([place]) 09/06/2014    Priority: Low  . Anticoagulant long-term use 05/01/2014    Priority: Low  . Persistent headaches 02/22/2014    Priority: Low  . Hot flashes related to aromatase inhibitor therapy 12/10/2013    Priority: Low  . Swelling   . Hyperglycemia   . Transaminitis 06/27/2015  . Thrombocytosis ([place]) 06/27/2015  . Iron deficiency 06/24/2015  . Bacterial UTI 06/24/2015  . Breast cancer ([place])   . UTI (urinary tract infection) 06/22/2015  . Vitamin D insufficiency 06/20/2015  . Fracture of left tibial plateau 06/15/2015  . Fracture tibia/fibula 06/14/2015    Medications- reviewed and updated Current Outpatient Prescriptions  Medication Sig Dispense Refill  . alendronate (FOSAMAX) 70 MG tablet Take 70 mg by mouth once a week.    . Cholecalciferol (VITAMIN D-3 PO) Take 1 tablet by mouth daily.     [name] gabapentin (NEURONTIN) 300 MG capsule Take 3 capsules (900 mg total) by mouth at bedtime. 30 capsule 1  . levothyroxine (SYNTHROID, LEVOTHROID) 25 MCG tablet Take 1 tablet (25 mcg total) by mouth daily. 90 tablet 3   . liothyronine (CYTOMEL) 25 MCG tablet Take 0.5 tablets (12.5 mcg total) by mouth daily. 45 tablet 3  . Multiple Vitamin (MULTIVITAMIN) tablet Take 1 tablet by mouth daily.    . prochlorperazine (COMPAZINE) 10 MG tablet Take 1 tablet (10 mg total) by mouth every 6 (six) hours as needed. 30 tablet 3  . rivaroxaban (XARELTO) 20 MG TABS tablet Take 1 tablet (20 mg total) by mouth daily with supper. 30 tablet 11  . venlafaxine XR (EFFEXOR-XR) 150 MG 24 hr capsule Take 1 capsule (150 mg total) by mouth daily with breakfast. 90 capsule 1  . Vitamin D, Ergocalciferol, (DRISDOL) 50000 UNITS CAPS capsule Take 1 capsule (50,000 Units total) by mouth every Thursday. 30 capsule 1  . methocarbamol (ROBAXIN) 500 MG tablet Take 1 tablet (500 mg total) by mouth every 6 (six) hours as needed for muscle spasms. (Patient not taking: Reported on 08/04/2015) 90 tablet 0  . oxyCODONE (OXY IR/ROXICODONE) 5 MG immediate release tablet Take 1-3 tablets (5-15 mg total) by mouth every 3 (three) hours as needed for moderate pain or breakthrough pain. (Patient not taking: Reported on 08/04/2015) 90 tablet 0   No current facility-administered medications for this visit.    Objective: BP 116/70 mmHg  Pulse 108  Temp(Src) 98.2 F (36.8 C)  Wt 160 lb (72.576 kg)  LMP 09/11/2012 Gen: NAD, resting comfortably CV: Regular rhythm- mild tachycardia, no murmurs rubs or gallops Lungs: CTAB no crackles, wheeze, rhonchi Abdomen: soft/nontender/nondistended/normal bowel sounds. No rebound or  guarding.  Ext: no edema, left leg in brace Skin: warm, dry Neuro: grossly normal, moves all extremities  Results for orders placed or performed in visit on 08/04/15 (from the past 24 hour(s))  POC Urinalysis Dipstick     Status: None   Collection Time: 08/04/15  1:23 PM  Result Value Ref Range   Color, UA yellow    Clarity, UA clear    Glucose, UA n    Bilirubin, UA n    Ketones, UA n    Spec Grav, UA >=1.030    Blood, UA trace-lysed     pH, UA 5.0    Protein, UA n    Urobilinogen, UA 0.2    Nitrite, UA n    Leukocytes, UA Negative Negative    Assessment/Plan:  Urinary urgency and frequency S: 2-3 days of symptoms. Started with some urgency but quickly noted frequency and some nocturia. No dysuria. Slight odor to urine. Stable in course. Cranberry juice helping slightly. Overall feels well except for urinary symptoms with no systemic issues.  A/P: Unclear etiology as not clear UTI. UA shows trace lysed blood only. Spec grav suggests mild dehydration as well. Advised to push fluids. UA could be false negative. We will get urine culture and only treat if positive >100k colonies. If worsening symptoms- consider recollection (could drop off)  Return precautions advised.   Orders Placed This Encounter  Procedures  . Urine culture    [facility]  . POC Urinalysis Dipstick

## 2015-08-06 LAB — URINE CULTURE: Colony Count: 30000

## 2015-08-08 ENCOUNTER — Telehealth: Payer: Self-pay | Admitting: Family Medicine

## 2015-08-08 NOTE — Telephone Encounter (Signed)
Pt call about lad results and would like a call back

## 2015-08-09 NOTE — Telephone Encounter (Signed)
Pt called and notified

## 2015-08-10 ENCOUNTER — Other Ambulatory Visit: Payer: Self-pay | Admitting: Hematology and Oncology

## 2015-08-10 NOTE — Telephone Encounter (Signed)
Chart reviewed.

## 2015-08-25 ENCOUNTER — Encounter: Payer: Self-pay | Admitting: Hematology and Oncology

## 2015-08-25 NOTE — Progress Notes (Signed)
Per [facility], no auth is needed for xarelto. I sent all to [facility] pharmacy

## 2015-08-25 NOTE — Progress Notes (Signed)
I sent prior auth req to [facility] for xarelto

## 2015-09-21 ENCOUNTER — Other Ambulatory Visit: Payer: Self-pay | Admitting: Obstetrics and Gynecology

## 2015-09-22 LAB — CYTOLOGY - PAP

## 2015-10-04 ENCOUNTER — Ambulatory Visit (HOSPITAL_BASED_OUTPATIENT_CLINIC_OR_DEPARTMENT_OTHER): Payer: BLUE CROSS/BLUE SHIELD

## 2015-10-04 ENCOUNTER — Telehealth: Payer: Self-pay | Admitting: Hematology and Oncology

## 2015-10-04 ENCOUNTER — Encounter: Payer: Self-pay | Admitting: Hematology and Oncology

## 2015-10-04 ENCOUNTER — Ambulatory Visit (HOSPITAL_BASED_OUTPATIENT_CLINIC_OR_DEPARTMENT_OTHER): Payer: BLUE CROSS/BLUE SHIELD | Admitting: Hematology and Oncology

## 2015-10-04 VITALS — BP 116/80 | HR 103 | Temp 97.8°F | Resp 18 | Wt 156.9 lb

## 2015-10-04 DIAGNOSIS — C50311 Malignant neoplasm of lower-inner quadrant of right female breast: Secondary | ICD-10-CM

## 2015-10-04 DIAGNOSIS — E611 Iron deficiency: Secondary | ICD-10-CM

## 2015-10-04 DIAGNOSIS — M810 Age-related osteoporosis without current pathological fracture: Secondary | ICD-10-CM

## 2015-10-04 DIAGNOSIS — Z853 Personal history of malignant neoplasm of breast: Secondary | ICD-10-CM

## 2015-10-04 DIAGNOSIS — I82401 Acute embolism and thrombosis of unspecified deep veins of right lower extremity: Secondary | ICD-10-CM | POA: Diagnosis not present

## 2015-10-04 DIAGNOSIS — Z7901 Long term (current) use of anticoagulants: Secondary | ICD-10-CM

## 2015-10-04 DIAGNOSIS — D509 Iron deficiency anemia, unspecified: Secondary | ICD-10-CM | POA: Diagnosis not present

## 2015-10-04 DIAGNOSIS — R7401 Elevation of levels of liver transaminase levels: Secondary | ICD-10-CM

## 2015-10-04 DIAGNOSIS — R740 Nonspecific elevation of levels of transaminase and lactic acid dehydrogenase [LDH]: Secondary | ICD-10-CM

## 2015-10-04 LAB — CBC WITH DIFFERENTIAL/PLATELET
BASO%: 0.3 % (ref 0.0–2.0)
Basophils Absolute: 0 10*3/uL (ref 0.0–0.1)
EOS%: 2.6 % (ref 0.0–7.0)
Eosinophils Absolute: 0.2 10*3/uL (ref 0.0–0.5)
HCT: 40.6 % (ref 34.8–46.6)
HGB: 13.8 g/dL (ref 11.6–15.9)
LYMPH%: 42.7 % (ref 14.0–49.7)
MCH: 28.9 pg (ref 25.1–34.0)
MCHC: 34 g/dL (ref 31.5–36.0)
MCV: 85.1 fL (ref 79.5–101.0)
MONO#: 0.8 10*3/uL (ref 0.1–0.9)
MONO%: 11.3 % (ref 0.0–14.0)
NEUT#: 2.9 10*3/uL (ref 1.5–6.5)
NEUT%: 43.1 % (ref 38.4–76.8)
Platelets: 244 10*3/uL (ref 145–400)
RBC: 4.77 10*6/uL (ref 3.70–5.45)
RDW: 14.9 % — ABNORMAL HIGH (ref 11.2–14.5)
WBC: 6.6 10*3/uL (ref 3.9–10.3)
lymph#: 2.8 10*3/uL (ref 0.9–3.3)

## 2015-10-04 LAB — COMPREHENSIVE METABOLIC PANEL
ALT: 54 U/L (ref 0–55)
AST: 36 U/L — ABNORMAL HIGH (ref 5–34)
Albumin: 4 g/dL (ref 3.5–5.0)
Alkaline Phosphatase: 194 U/L — ABNORMAL HIGH (ref 40–150)
Anion Gap: 7 mEq/L (ref 3–11)
BUN: 11.3 mg/dL (ref 7.0–26.0)
CO2: 25 mEq/L (ref 22–29)
Calcium: 9.2 mg/dL (ref 8.4–10.4)
Chloride: 106 mEq/L (ref 98–109)
Creatinine: 0.7 mg/dL (ref 0.6–1.1)
EGFR: >90 mL/min/{1.73_m2} (ref >90)
Glucose: 117 mg/dl (ref 70–140)
Potassium: 3.9 mEq/L (ref 3.5–5.1)
Sodium: 138 mEq/L (ref 136–145)
Total Bilirubin: 0.41 mg/dL (ref 0.20–1.20)
Total Protein: 7.4 g/dL (ref 6.4–8.3)

## 2015-10-04 NOTE — Assessment & Plan Note (Signed)
Right breast invasive ductal carcinoma with DCIS, grade 3, ER 2%, PR 0%, HER-2 negative, Ki-67 34%, status post bilateral mastectomies and reconstruction, adjuvant chemotherapy with before meals 4 followed by [name] Taxol 12, antiestrogen therapy with Arimidex started 05/19/2013 eventhough she has only 2% ER positivity stopped 07/06/2015 (due to osteoporotic fracture tibia and due to 2% ER positivity)  DVT right leg: U/S Leg 12/15/14: Chronic deep vein thrombosis noted in the right popliteal vein. Appeared unchanged from studies on 09/28/14, 03/23/14, and 11/10/13 Hospitalization 06/24/15 to 07/01/15 (left tibia fracture), Left peroneal DVT  Recurrent DVT of left peroneal vein from recent fracture: Currently on xarelto. Apparently this should be billed to worker's compensation [place] [facility] [phone] or office No [phone] or email [email or web address])  Leg cramps: Gabapentin to 300 three times a day and on Effexor 75 mg daily   Iron deficiency anemia: On oral iron therapy hemoglobin appears to be improving slowly  RTC 6 months for follow-up.

## 2015-10-04 NOTE — Telephone Encounter (Signed)
appt made and avs printed. Sent patient back to lab today per 3/21 pof

## 2015-10-04 NOTE — Progress Notes (Signed)
Patient Care Team: Marin Olp, MD as PCP - General (Family Medicine)  DIAGNOSIS: Primary cancer of lower-inner quadrant of right female breast [facility])   Staging form: Breast, AJCC 7th Edition     Clinical: Stage IA (T1c, N0, cM0) - Unsigned       Staging comments: Staged at breast conference 12.18.13      Pathologic: No stage assigned - Unsigned   SUMMARY OF ONCOLOGIC HISTORY:   Primary cancer of lower-inner quadrant of right female breast ([place])   06/18/2012 Mammogram screening mammogram in 06/2012 that showed a spiculated mass in the lower inner quadrant of the right breast. She had ultrasound performed that showed irregular mass at the 5:00 o'clock position 3 cm from the nipple measuring 1.1 cm.   06/19/2012 Initial Biopsy invasive ductal carcinoma with DCIS, grade 3 tumor that was ER 2% PR negative HER-2/neu negative. Ki-67 was elevated at 34%   06/24/2012 Breast MRI middle third of the lower inner quadrant of the right breast a 1.5 x 1.3 x 1.9 cm irregular enhancing mass.    06/24/2012 Initial Diagnosis Cancer of lower-inner quadrant of female breast   09/11/2012 Surgery Bilateral mastectomies   10/15/2012 - 03/03/2013 Chemotherapy AC X 4 followed by Carbo-Taxol weekly X 12   03/30/2013 Procedure DVT Right leg on Xarelto   05/19/2013 - 07/06/2015 Anti-estrogen oral therapy Arimidex discontinued because of  left leg fracture (with a history of osteoporosis)   06/24/2015 Imaging Left peroneal vein DVT when she developed left tibia and fibular fracture    CHIEF COMPLIANT: Follow-up of breast cancer and Iron deficiency anemia  INTERVAL HISTORY: Nancy Beard is a 61 year old lady with above-mentioned history right breast cancer who underwent bilateral mastectomies followed by adjuvant chemotherapy and took antiestrogen therapy and we discontinued it because she had a tibial fracture because of osteoporosis. She developed a recurrent left peroneal vein DVT and is currently on Xarelto.  She is currently undergoing physical therapy and appears to be improving slowly over time.  REVIEW OF SYSTEMS:   Constitutional: Denies fevers, chills or abnormal weight loss Eyes: Denies blurriness of vision Ears, nose, mouth, throat, and face: Denies mucositis or sore throat Respiratory: Denies cough, dyspnea or wheezes Cardiovascular: Denies palpitation, chest discomfort Gastrointestinal:  Denies nausea, heartburn or change in bowel habits Skin: Denies abnormal skin rashes Lymphatics: Denies new lymphadenopathy or easy bruising Neurological:Denies numbness, tingling or new weaknesses Behavioral/Psych: Mood is stable, no new changes  Extremities: Left leg tibial fracture Breast:  denies any pain or lumps or nodules in either breasts All other systems were reviewed with the patient and are negative.  I have reviewed the past medical history, past surgical history, social history and family history with the patient and they are unchanged from previous note.  ALLERGIES:  is allergic to ciprofloxacin; macrobid; dilaudid; and gluten meal.  MEDICATIONS:  Current Outpatient Prescriptions  Medication Sig Dispense Refill  . alendronate (FOSAMAX) 70 MG tablet Take 70 mg by mouth once a week.    . Cholecalciferol (VITAMIN D-3 PO) Take 1 tablet by mouth daily.     [name] gabapentin (NEURONTIN) 300 MG capsule Take 3 capsules (900 mg total) by mouth at bedtime. 30 capsule 1  . levothyroxine (SYNTHROID, LEVOTHROID) 25 MCG tablet Take 1 tablet (25 mcg total) by mouth daily. 90 tablet 3  . liothyronine (CYTOMEL) 25 MCG tablet Take 0.5 tablets (12.5 mcg total) by mouth daily. 45 tablet 3  . methocarbamol (ROBAXIN) 500 MG tablet Take 1 tablet (  500 mg total) by mouth every 6 (six) hours as needed for muscle spasms. (Patient not taking: Reported on 08/04/2015) 90 tablet 0  . Multiple Vitamin (MULTIVITAMIN) tablet Take 1 tablet by mouth daily.    [name] oxyCODONE (OXY IR/ROXICODONE) 5 MG immediate release tablet  Take 1-3 tablets (5-15 mg total) by mouth every 3 (three) hours as needed for moderate pain or breakthrough pain. (Patient not taking: Reported on 08/04/2015) 90 tablet 0  . prochlorperazine (COMPAZINE) 10 MG tablet Take 1 tablet (10 mg total) by mouth every 6 (six) hours as needed. 30 tablet 3  . rivaroxaban (XARELTO) 20 MG TABS tablet Take 1 tablet (20 mg total) by mouth daily with supper. 30 tablet 11  . venlafaxine XR (EFFEXOR-XR) 150 MG 24 hr capsule Take 1 capsule (150 mg total) by mouth daily with breakfast. 90 capsule 1  . Vitamin D, Ergocalciferol, (DRISDOL) 50000 UNITS CAPS capsule Take 1 capsule (50,000 Units total) by mouth every Thursday. 30 capsule 1   No current facility-administered medications for this visit.    PHYSICAL EXAMINATION: ECOG PERFORMANCE STATUS: 1 - Symptomatic but completely ambulatory  Filed Vitals:   10/04/15 1535  BP: 116/80  Pulse: 103  Temp: 97.8 F (36.6 C)  Resp: 18   Filed Weights   10/04/15 1535  Weight: 156 lb 14.4 oz (71.169 kg)    GENERAL:alert, no distress and comfortable SKIN: skin color, texture, turgor are normal, no rashes or significant lesions EYES: normal, Conjunctiva are pink and non-injected, sclera clear OROPHARYNX:no exudate, no erythema and lips, buccal mucosa, and tongue normal  NECK: supple, thyroid normal size, non-tender, without nodularity LYMPH:  no palpable lymphadenopathy in the cervical, axillary or inguinal LUNGS: clear to auscultation and percussion with normal breathing effort HEART: regular rate & rhythm and no murmurs and no lower extremity edema ABDOMEN:abdomen soft, non-tender and normal bowel sounds MUSCULOSKELETAL:no cyanosis of digits and no clubbing  NEURO: alert & oriented x 3 with fluent speech, no focal motor/sensory deficits EXTREMITIES: Left leg tibial fracture  LABORATORY DATA:  I have reviewed the data as listed   Chemistry      Component Value Date/Time   NA 138 10/04/2015 1558   NA 138  06/30/2015 0804   K 3.9 10/04/2015 1558   K 4.5 06/30/2015 0804   CL 100* 06/30/2015 0804   CL 105 01/06/2013 0827   CO2 25 10/04/2015 1558   CO2 29 06/30/2015 0804   BUN 11.3 10/04/2015 1558   BUN 8 06/30/2015 0804   CREATININE 0.7 10/04/2015 1558   CREATININE 0.62 06/30/2015 0804      Component Value Date/Time   CALCIUM 9.2 10/04/2015 1558   CALCIUM 9.4 06/30/2015 0804   CALCIUM 8.6* 06/15/2015 1027   ALKPHOS 194* 10/04/2015 1558   ALKPHOS 681* 06/30/2015 0804   AST 36* 10/04/2015 1558   AST 57* 06/30/2015 0804   ALT 54 10/04/2015 1558   ALT 147* 06/30/2015 0804   BILITOT 0.41 10/04/2015 1558   BILITOT 0.8 06/30/2015 0804       Lab Results  Component Value Date   WBC 6.6 10/04/2015   HGB 13.8 10/04/2015   HCT 40.6 10/04/2015   MCV 85.1 10/04/2015   PLT 244 10/04/2015   NEUTROABS 2.9 10/04/2015     ASSESSMENT & PLAN:  Primary cancer of lower-inner quadrant of right female breast Right breast invasive ductal carcinoma with DCIS, grade 3, ER 2%, PR 0%, HER-2 negative, Ki-67 34%, status post bilateral mastectomies and reconstruction, adjuvant chemotherapy  with before meals 4 followed by [name] Taxol 12, antiestrogen therapy with Arimidex started 05/19/2013 eventhough she has only 2% ER positivity stopped 07/06/2015 (due to osteoporotic fracture tibia and due to 2% ER positivity)  DVT right leg: U/S Leg 12/15/14: Chronic deep vein thrombosis noted in the right popliteal vein. Appeared unchanged from studies on 09/28/14, 03/23/14, and 11/10/13 Hospitalization 06/24/15 to 07/01/15 (left tibia fracture), Left peroneal DVT  Recurrent DVT of left peroneal vein from recent fracture: Currently on xarelto. Apparently this should be billed to worker's compensation [place] [facility] [phone] or office No [phone] or email [email or web address])  Leg cramps: Gabapentin to 300 three times a day and on Effexor 75 mg daily , Since her leg cramps are improved, she will taper off  and discontinue both gabapentin and Effexor. Elevated transaminases: AST and ALT have improved significantly alkaline phosphatase is also improved significantly although it is slightly elevated still at 194. Iron deficiency anemia: On oral iron therapy hemoglobin appears to be improving slowly. Hemoglobin is 13.8. I will call her with the results of this test tomorrow.  RTC one year for follow-up.   Orders Placed This Encounter  Procedures  . CBC with Differential    Standing Status: Future     Number of Occurrences: 1     Standing Expiration Date: 10/03/2016  . Comprehensive metabolic panel    Standing Status: Future     Number of Occurrences: 1     Standing Expiration Date: 10/03/2016  . Iron and TIBC    Standing Status: Future     Number of Occurrences: 1     Standing Expiration Date: 10/03/2016  . Ferritin    Standing Status: Future     Number of Occurrences: 1     Standing Expiration Date: 10/03/2016   The patient has a good understanding of the overall plan. she agrees with it. she will call with any problems that may develop before the next visit here.   Rulon Eisenmenger, MD 10/04/2015

## 2015-10-05 LAB — FERRITIN: Ferritin: 23 ng/ml (ref 9–269)

## 2015-10-05 LAB — IRON AND TIBC
%SAT: 22 % (ref 21–57)
Iron: 81 ug/dL (ref 41–142)
TIBC: 371 ug/dL (ref 236–444)
UIBC: 291 ug/dL (ref 120–384)

## 2015-11-15 ENCOUNTER — Encounter: Payer: Self-pay | Admitting: Hematology and Oncology

## 2015-11-15 NOTE — Progress Notes (Signed)
[facility] prior [facility] req for xarelto

## 2015-11-16 ENCOUNTER — Encounter: Payer: Self-pay | Admitting: Hematology and Oncology

## 2015-11-16 NOTE — Progress Notes (Signed)
sent [facility] prior auth req 11/15/15- per [facility] no prior auth req for [employer]

## 2015-12-11 ENCOUNTER — Other Ambulatory Visit: Payer: Self-pay | Admitting: Hematology and Oncology

## 2015-12-13 ENCOUNTER — Other Ambulatory Visit: Payer: Self-pay | Admitting: Hematology and Oncology

## 2015-12-16 ENCOUNTER — Other Ambulatory Visit: Payer: Self-pay | Admitting: Orthopedic Surgery

## 2015-12-16 DIAGNOSIS — I82491 Acute embolism and thrombosis of other specified deep vein of right lower extremity: Secondary | ICD-10-CM

## 2015-12-25 DIAGNOSIS — J189 Pneumonia, unspecified organism: Secondary | ICD-10-CM | POA: Diagnosis not present

## 2015-12-25 DIAGNOSIS — R05 Cough: Secondary | ICD-10-CM | POA: Diagnosis not present

## 2015-12-27 ENCOUNTER — Other Ambulatory Visit: Payer: Self-pay | Admitting: Hematology and Oncology

## 2015-12-27 DIAGNOSIS — C50311 Malignant neoplasm of lower-inner quadrant of right female breast: Secondary | ICD-10-CM

## 2016-01-09 ENCOUNTER — Ambulatory Visit
Admission: RE | Admit: 2016-01-09 | Discharge: 2016-01-09 | Disposition: A | Discharge status: Home / Self Care | Payer: Worker's Compensation | Source: Ambulatory Visit | Attending: Orthopedic Surgery | Admitting: Orthopedic Surgery

## 2016-01-09 DIAGNOSIS — I82491 Acute embolism and thrombosis of other specified deep vein of right lower extremity: Secondary | ICD-10-CM

## 2016-02-08 ENCOUNTER — Other Ambulatory Visit: Payer: Self-pay | Admitting: Family Medicine

## 2016-02-08 NOTE — Telephone Encounter (Signed)
Rx refill sent to pharmacy. 

## 2016-03-02 ENCOUNTER — Other Ambulatory Visit: Payer: Self-pay

## 2016-03-02 ENCOUNTER — Telehealth: Payer: Self-pay

## 2016-03-02 DIAGNOSIS — C50311 Malignant neoplasm of lower-inner quadrant of right female breast: Secondary | ICD-10-CM

## 2016-03-02 DIAGNOSIS — R51 Headache: Secondary | ICD-10-CM

## 2016-03-02 DIAGNOSIS — R519 Headache, unspecified: Secondary | ICD-10-CM

## 2016-03-02 NOTE — Telephone Encounter (Signed)
Brain MRI scheduled for 03/05/16 5:00.  Called pt and reviewed date and time. No further questions or concerns at time of call.

## 2016-03-02 NOTE — Progress Notes (Signed)
Received VM from pt who calls with concerns regarding new onset of headaches.  Called pt back to discuss in more detail.  Pt has history of triple negative breast cancer diagnosed in 2013 who is on yearly follow ups who reports new onset of headaches and dizziness within last month.  Pt reports they have gotten progressively worse and stated yesterday it was extremely painful.  Pt denies vision or balance changes.  Information reviewed with Dr. Lindi Adie who ordered brain MRI to be done within the next week.  Order entered and pt notified of plan of care moving forward.  No additional questions or concerns at time of call.

## 2016-03-06 ENCOUNTER — Ambulatory Visit (HOSPITAL_COMMUNITY)
Admission: RE | Admit: 2016-03-06 | Discharge: 2016-03-06 | Disposition: A | Discharge status: Home / Self Care | Payer: BLUE CROSS/BLUE SHIELD | Source: Ambulatory Visit | Attending: Hematology and Oncology | Admitting: Hematology and Oncology

## 2016-03-06 ENCOUNTER — Other Ambulatory Visit: Payer: Self-pay

## 2016-03-06 DIAGNOSIS — I6782 Cerebral ischemia: Secondary | ICD-10-CM | POA: Insufficient documentation

## 2016-03-06 DIAGNOSIS — R519 Headache, unspecified: Secondary | ICD-10-CM

## 2016-03-06 DIAGNOSIS — C50311 Malignant neoplasm of lower-inner quadrant of right female breast: Secondary | ICD-10-CM | POA: Insufficient documentation

## 2016-03-06 DIAGNOSIS — R51 Headache: Secondary | ICD-10-CM | POA: Insufficient documentation

## 2016-03-06 LAB — POCT I-STAT CREATININE: Creatinine, Ser: 0.7 mg/dL (ref 0.44–1.00)

## 2016-03-06 MED ORDER — GADOBENATE DIMEGLUMINE 529 MG/ML IV SOLN
15.0000 mL | Freq: Once | INTRAVENOUS | Status: AC | PRN
  Administered 2016-03-06: 15 mL via INTRAVENOUS

## 2016-06-06 ENCOUNTER — Other Ambulatory Visit: Payer: Self-pay | Admitting: Family Medicine

## 2016-06-06 ENCOUNTER — Telehealth: Payer: Self-pay | Admitting: Family Medicine

## 2016-06-06 ENCOUNTER — Other Ambulatory Visit: Payer: Self-pay

## 2016-06-06 MED ORDER — LEVOTHYROXINE SODIUM 25 MCG PO TABS: 25.0000 ug | ORAL_TABLET | Freq: Every day | ORAL | 3 refills | Status: DC

## 2016-06-06 NOTE — Telephone Encounter (Signed)
° ° °  Pt request refill of the following:  levothyroxine (SYNTHROID, LEVOTHROID) 25 MCG tablet   Pt need to have this refill today    Phamacy: [facility]

## 2016-08-01 ENCOUNTER — Other Ambulatory Visit: Payer: Self-pay | Admitting: Family Medicine

## 2016-08-11 ENCOUNTER — Other Ambulatory Visit: Payer: Self-pay | Admitting: Hematology and Oncology

## 2016-08-29 ENCOUNTER — Other Ambulatory Visit: Payer: Self-pay

## 2016-08-29 MED ORDER — VENLAFAXINE HCL ER 150 MG PO CP24: 150.0000 mg | ORAL_CAPSULE | Freq: Every day | ORAL | 1 refills | Status: DC

## 2016-10-01 ENCOUNTER — Ambulatory Visit (HOSPITAL_BASED_OUTPATIENT_CLINIC_OR_DEPARTMENT_OTHER): Payer: BLUE CROSS/BLUE SHIELD | Admitting: Hematology and Oncology

## 2016-10-01 ENCOUNTER — Encounter: Payer: Self-pay | Admitting: Hematology and Oncology

## 2016-10-01 ENCOUNTER — Other Ambulatory Visit: Payer: Self-pay | Admitting: Obstetrics and Gynecology

## 2016-10-01 DIAGNOSIS — I82592 Chronic embolism and thrombosis of other specified deep vein of left lower extremity: Secondary | ICD-10-CM

## 2016-10-01 DIAGNOSIS — C50311 Malignant neoplasm of lower-inner quadrant of right female breast: Secondary | ICD-10-CM

## 2016-10-01 DIAGNOSIS — Z124 Encounter for screening for malignant neoplasm of cervix: Secondary | ICD-10-CM | POA: Diagnosis not present

## 2016-10-01 DIAGNOSIS — Z7901 Long term (current) use of anticoagulants: Secondary | ICD-10-CM | POA: Diagnosis not present

## 2016-10-01 DIAGNOSIS — R740 Nonspecific elevation of levels of transaminase and lactic acid dehydrogenase [LDH]: Secondary | ICD-10-CM | POA: Diagnosis not present

## 2016-10-01 DIAGNOSIS — Z01419 Encounter for gynecological examination (general) (routine) without abnormal findings: Secondary | ICD-10-CM | POA: Diagnosis not present

## 2016-10-01 DIAGNOSIS — Z6825 Body mass index (BMI) 25.0-25.9, adult: Secondary | ICD-10-CM | POA: Diagnosis not present

## 2016-10-01 DIAGNOSIS — Z853 Personal history of malignant neoplasm of breast: Secondary | ICD-10-CM | POA: Diagnosis not present

## 2016-10-01 NOTE — Assessment & Plan Note (Signed)
Right breast invasive ductal carcinoma with DCIS, grade 3, ER 2%, PR 0%, HER-2 negative, Ki-67 34%, status post bilateral mastectomies and reconstruction, adjuvant chemotherapy with before meals 4 followed by carbo Taxol 12, antiestrogen therapy with Arimidex started 05/19/2013 eventhough she has only 2% ER positivity stopped 07/06/2015 (due to osteoporotic fracture tibia and due to 2% ER positivity)  DVT right leg: U/S Leg 12/15/14: Chronic deep vein thrombosis noted in the right popliteal vein. Appeared unchanged from studies on 09/28/14, 03/23/14, and 11/10/13 Hospitalization 06/24/15 to 07/01/15 (left tibia fracture), Left peroneal DVT  Recurrent DVT of left peroneal vein from recent fracture: Currently on xarelto. Apparently this should be billed to worker's compensation [employer] (Beth Hagler [phone] or office No [phone] or email [email or web address])  Leg cramps: improved, she has tapered off and discontinued both gabapentin and Effexor. Elevated transaminases: AST and ALT have improved significantly  Iron deficiency anemia: On oral iron therapy hemoglobin appears to be improving slowly. Hemoglobin is   RTC one year for follow-up. 

## 2016-10-01 NOTE — Progress Notes (Signed)
Patient Care Team: Marin Olp, MD as PCP - General (Family Medicine)  DIAGNOSIS:  Encounter Diagnosis  Name Primary?  . Primary cancer of lower-inner quadrant of right female breast ([place])     SUMMARY OF ONCOLOGIC HISTORY:   Primary cancer of lower-inner quadrant of right female breast ([place])   06/18/2012 Mammogram    screening mammogram in 06/2012 that showed a spiculated mass in the lower inner quadrant of the right breast. She had ultrasound performed that showed irregular mass at the 5:00 o'clock position 3 cm from the nipple measuring 1.1 cm.      06/19/2012 Initial Biopsy    invasive ductal carcinoma with DCIS, grade 3 tumor that was ER 2% PR negative HER-2/neu negative. Ki-67 was elevated at 34%      06/24/2012 Breast MRI    middle third of the lower inner quadrant of the right breast a 1.5 x 1.3 x 1.9 cm irregular enhancing mass.       06/24/2012 Initial Diagnosis    Cancer of lower-inner quadrant of female breast      09/11/2012 Surgery    Bilateral mastectomies      10/15/2012 - 03/03/2013 Chemotherapy    AC X 4 followed by Carbo-Taxol weekly X 12      03/30/2013 Procedure    DVT Right leg on Xarelto      05/19/2013 - 07/06/2015 Anti-estrogen oral therapy    Arimidex discontinued because of  left leg fracture (with a history of osteoporosis)      06/24/2015 Imaging    Left peroneal vein DVT when she developed left tibia and fibular fracture       CHIEF COMPLIANT: Surveillance of breast cancer  INTERVAL HISTORY: Nancy Beard is a 61 year old with above-mentioned history of breast cancer diagnosed in 2013 and is currently on surveillance. She had bilateral mastectomies. She also had DVT of right lower extremity for which she did Xarelto. She stop Arimidex therapy because of leg fracture in 2016. She is recovering very well from this fracture. She continues to have some discomfort in the leg. Denies any lumps or nodules in the axilla.  REVIEW OF  SYSTEMS:   Constitutional: Denies fevers, chills or abnormal weight loss Eyes: Denies blurriness of vision Ears, nose, mouth, throat, and face: Denies mucositis or sore throat Respiratory: Denies cough, dyspnea or wheezes Cardiovascular: Denies palpitation, chest discomfort Gastrointestinal:  Denies nausea, heartburn or change in bowel habits Skin: Denies abnormal skin rashes Lymphatics: Denies new lymphadenopathy or easy bruising Neurological:Denies numbness, tingling or new weaknesses Behavioral/Psych: Mood is stable, no new changes  Extremities: No lower extremity edema Breast:  denies any pain or lumps or nodules in either chest wall or axilla All other systems were reviewed with the patient and are negative.  I have reviewed the past medical history, past surgical history, social history and family history with the patient and they are unchanged from previous note.  ALLERGIES:  is allergic to ciprofloxacin; macrobid [nitrofurantoin]; dilaudid [hydromorphone hcl]; and gluten meal.  MEDICATIONS:  Current Outpatient Prescriptions  Medication Sig Dispense Refill  . alendronate (FOSAMAX) 70 MG tablet Take 70 mg by mouth once a week.    [name] alendronate (FOSAMAX) 70 MG tablet TAKE 1 TABLET BY MOUTH EVERY WEEK 12 tablet 3  . Cholecalciferol (VITAMIN D-3 PO) Take 1 tablet by mouth daily.     [name] gabapentin (NEURONTIN) 300 MG capsule Take 3 capsules (900 mg total) by mouth at bedtime. 30 capsule 1  . levothyroxine (  SYNTHROID, LEVOTHROID) 25 MCG tablet Take 1 tablet (25 mcg total) by mouth daily. 90 tablet 3  . levothyroxine (SYNTHROID, LEVOTHROID) 25 MCG tablet TAKE 1 TABLET (25 MCG TOTAL) BY MOUTH DAILY. 90 tablet 3  . liothyronine (CYTOMEL) 25 MCG tablet TAKE 1/2 TABLET BY MOUTH EVERY DAY 45 tablet 3  . methocarbamol (ROBAXIN) 500 MG tablet Take 1 tablet (500 mg total) by mouth every 6 (six) hours as needed for muscle spasms. (Patient not taking: Reported on 08/04/2015) 90 tablet 0  . Multiple  Vitamin (MULTIVITAMIN) tablet Take 1 tablet by mouth daily.    [name] oxyCODONE (OXY IR/ROXICODONE) 5 MG immediate release tablet Take 1-3 tablets (5-15 mg total) by mouth every 3 (three) hours as needed for moderate pain or breakthrough pain. (Patient not taking: Reported on 08/04/2015) 90 tablet 0  . prochlorperazine (COMPAZINE) 10 MG tablet Take 1 tablet (10 mg total) by mouth every 6 (six) hours as needed. 30 tablet 3  . rivaroxaban (XARELTO) 20 MG TABS tablet Take 1 tablet (20 mg total) by mouth daily with supper. 30 tablet 11  . venlafaxine XR (EFFEXOR-XR) 150 MG 24 hr capsule Take 1 capsule (150 mg total) by mouth daily with breakfast. 90 capsule 1  . Vitamin D, Ergocalciferol, (DRISDOL) 50000 UNITS CAPS capsule Take 1 capsule (50,000 Units total) by mouth every Thursday. 30 capsule 1   No current facility-administered medications for this visit.     PHYSICAL EXAMINATION: ECOG PERFORMANCE STATUS: 1 - Symptomatic but completely ambulatory  Vitals:   10/01/16 1404  BP: 137/62  Pulse: 86  Resp: 18  Temp: 98.1 F (36.7 C)   Filed Weights   10/01/16 1404  Weight: 161 lb 1.6 oz (73.1 kg)    GENERAL:alert, no distress and comfortable SKIN: skin color, texture, turgor are normal, no rashes or significant lesions EYES: normal, Conjunctiva are pink and non-injected, sclera clear OROPHARYNX:no exudate, no erythema and lips, buccal mucosa, and tongue normal  NECK: supple, thyroid normal size, non-tender, without nodularity LYMPH:  no palpable lymphadenopathy in the cervical, axillary or inguinal LUNGS: clear to auscultation and percussion with normal breathing effort HEART: regular rate & rhythm and no murmurs and no lower extremity edema ABDOMEN:abdomen soft, non-tender and normal bowel sounds MUSCULOSKELETAL:no cyanosis of digits and no clubbing  NEURO: alert & oriented x 3 with fluent speech, no focal motor/sensory deficits EXTREMITIES: No lower extremity edema  LABORATORY DATA:  I  have reviewed the data as listed   Chemistry      Component Value Date/Time   NA 138 10/04/2015 1558   K 3.9 10/04/2015 1558   CL 100 (L) 06/30/2015 0804   CL 105 01/06/2013 0827   CO2 25 10/04/2015 1558   BUN 11.3 10/04/2015 1558   CREATININE 0.70 03/06/2016 1742   CREATININE 0.7 10/04/2015 1558      Component Value Date/Time   CALCIUM 9.2 10/04/2015 1558   ALKPHOS 194 (H) 10/04/2015 1558   AST 36 (H) 10/04/2015 1558   ALT 54 10/04/2015 1558   BILITOT 0.41 10/04/2015 1558       Lab Results  Component Value Date   WBC 6.6 10/04/2015   HGB 13.8 10/04/2015   HCT 40.6 10/04/2015   MCV 85.1 10/04/2015   PLT 244 10/04/2015   NEUTROABS 2.9 10/04/2015    ASSESSMENT & PLAN:  Primary cancer of lower-inner quadrant of right female breast Right breast invasive ductal carcinoma with DCIS, grade 3, ER 2%, PR 0%, HER-2 negative, Ki-67 34%, status post  bilateral mastectomies and reconstruction, adjuvant chemotherapy with before meals 4 followed by [name] Taxol 12, antiestrogen therapy with Arimidex started 05/19/2013 eventhough she has only 2% ER positivity stopped 07/06/2015 (due to osteoporotic fracture tibia and due to 2% ER positivity)  DVT right leg: U/S Leg 12/15/14: Chronic deep vein thrombosis noted in the right popliteal vein. Appeared unchanged from studies on 09/28/14, 03/23/14, and 11/10/13 Hospitalization 06/24/15 to 07/01/15 (left tibia fracture), Left peroneal DVT  Recurrent DVT of left peroneal vein from recent fracture: Currently on xarelto. Apparently this should be billed to worker's compensation [facility] [phone] or office No [phone] or email [email or web address])  Leg cramps: improved, she has tapered off and discontinued both gabapentin. Elevated transaminases: AST and ALT had improved significantly. Patient gets blood work to her primary care physician  Iron deficiency anemia: Resolved  RTC one year for follow-up.   I spent 25 minutes  talking to the patient of which more than half was spent in counseling and coordination of care.  No orders of the defined types were placed in this encounter.  The patient has a good understanding of the overall plan. she agrees with it. she will call with any problems that may develop before the next visit here.   Rulon Eisenmenger, MD 10/01/16

## 2016-10-02 LAB — CYTOLOGY - PAP

## 2016-10-03 ENCOUNTER — Ambulatory Visit: Payer: Self-pay | Admitting: Hematology and Oncology

## 2016-11-26 ENCOUNTER — Emergency Department (INDEPENDENT_AMBULATORY_CARE_PROVIDER_SITE_OTHER): Payer: BLUE CROSS/BLUE SHIELD

## 2016-11-26 ENCOUNTER — Emergency Department
Admission: EM | Admit: 2016-11-26 | Discharge: 2016-11-26 | Disposition: A | Discharge status: Home / Self Care | Payer: BLUE CROSS/BLUE SHIELD | Source: Home / Self Care | Attending: Family Medicine | Admitting: Family Medicine

## 2016-11-26 DIAGNOSIS — J069 Acute upper respiratory infection, unspecified: Secondary | ICD-10-CM

## 2016-11-26 DIAGNOSIS — R05 Cough: Secondary | ICD-10-CM

## 2016-11-26 DIAGNOSIS — B9789 Other viral agents as the cause of diseases classified elsewhere: Secondary | ICD-10-CM

## 2016-11-26 MED ORDER — GUAIFENESIN-CODEINE 100-10 MG/5ML PO SYRP: 5.0000 mL | ORAL_SOLUTION | Freq: Three times a day (TID) | ORAL | 0 refills | Status: DC | PRN

## 2016-11-26 NOTE — Discharge Instructions (Signed)
°  Virtussin (guaifenesin-codeine) is a strong narcotic cough medication.  Only take up to 3 times daily as needed for severe cough.  Be sure to take with large glass of water.  It may cause drowsiness. Do not drive, drink alcohol or take other sedating medications such as Nyquil while taking this medication.

## 2016-11-26 NOTE — ED Triage Notes (Signed)
Dry cough x 1 week, worse last night

## 2016-11-26 NOTE — ED Provider Notes (Signed)
CSN: [id number]     Arrival date & time 11/26/16  5277 History   First MD Initiated Contact with Patient 11/26/16 1038     Chief Complaint  Patient presents with  . Cough   (Consider location/radiation/quality/duration/timing/severity/associated sxs/prior Treatment) HPI  Nancy Beard is a 61 y.o. female presenting to UC with c/o dry hacking cough for about 1 week. Cough was worse last night.  She notes her husband was treated for a viral illness a few weeks ago but was recently admitted to hospital for IV antibiotics for sinusitis. He was later found to have a trace amount of pneumonia, per pt.  Pt denies fever, chills, n/v/d.    Past Medical History:  Diagnosis Date  . Breast cancer ([facility])    right  . Depression   . DIVERTICULOSIS, COLON 05/05/2007  . DVT (deep venous thrombosis) ([facility]) 2014   Right lower extremity, thigh  . HEMORRHOIDS, EXTERNAL 05/05/2007  . History of chemotherapy   . Hypothyroidism   . Neuropathy ([facility])    of fingers and toes  . Pneumonia    hx of years ago  . Sprue   . Thyroid disease   . Vitamin D insufficiency 06/20/2015  . Wears glasses    Past Surgical History:  Procedure Laterality Date  . AXILLARY SENTINEL NODE BIOPSY Right 09/11/2012   Procedure: AXILLARY SENTINEL lymph NODE  BIOPSY;  Surgeon: Odis Hollingshead, MD;  Location: [facility];  Service: General;  Laterality: Right;  right nuclear medicine injection 12:30   . BREAST LUMPECTOMY     benign lump  . BREAST RECONSTRUCTION Right 09/24/2013   Procedure: REVISION OF RIGHT BREAST RECONSTRUCTION WITH REPOSITIONING RIGHT IMPLANT, POSSIBLE EXCISION CAPSULAR CONTRACTURE AND LIPOFILLING FOR FAT GRAFTING;  Surgeon: Theodoro Kos, DO;  Location: [place];  Service: Plastics;  Laterality: Right;  . BREAST RECONSTRUCTION WITH PLACEMENT OF TISSUE EXPANDER AND FLEX HD (ACELLULAR HYDRATED DERMIS) Bilateral 09/11/2012   Procedure: BREAST RECONSTRUCTION WITH PLACEMENT OF TISSUE EXPANDER AND FLEX HD  (ACELLULAR HYDRATED DERMIS) ADM;  Surgeon: Theodoro Kos, DO;  Location: [facility];  Service: Plastics;  Laterality: Bilateral;  . COLONOSCOPY  2006  . cryoblation of cervix     long time ago  . ESOPHAGOGASTRODUODENOSCOPY  2007   sprue  . EXTERNAL FIXATION LEG Left 06/15/2015   Procedure: EXTERNAL FIXATION LEG;  Surgeon: Altamese Mansura, MD;  Location: [facility];  Service: Orthopedics;  Laterality: Left;  . INCISION AND DRAINAGE OF WOUND Left 09/16/2012   Procedure: Left Breast Evacuation of Hematoma;  Surgeon: Theodoro Kos, DO;  Location: [facility];  Service: Plastics;  Laterality: Left;  . LIPOSUCTION WITH LIPOFILLING Bilateral 09/24/2013   Procedure: LIPOSUCTION WITH LIPOFILLING;  Surgeon: Theodoro Kos, DO;  Location: [place];  Service: Plastics;  Laterality: Bilateral;  Biltalteal filling breast  . ORIF TIBIA PLATEAU Left 06/21/2015   Procedure: OPEN REDUCTION INTERNAL FIXATION (ORIF) TIBIAL PLATEAU REMOVAL OF EXTERNAL FISTULA;  Surgeon: Altamese , MD;  Location: [place] [facility];  Service: Orthopedics;  Laterality: Left;  . PORT-A-CATH REMOVAL Right 08/07/2013   Procedure: REMOVAL PORT-A-CATH;  Surgeon: Odis Hollingshead, MD;  Location: [facility];  Service: General;  Laterality: Right;  . PORTACATH PLACEMENT Right 10/09/2012   Procedure: US GUIDED INSERTION PORT-A-CATH;  Surgeon: Odis Hollingshead, MD;  Location: [facility];  Service: General;  Laterality: Right;  Right Subclavian Vein  . REMOVAL OF BILATERAL TISSUE EXPANDERS WITH PLACEMENT OF BILATERAL BREAST IMPLANTS Bilateral 06/24/2013  Procedure: REMOVAL OF BILATERAL TISSUE EXPANDERS WITH PLACEMENT OF BILATERAL BREAST IMPLANTS;  Surgeon: Theodoro Kos, DO;  Location: [facility];  Service: Plastics;  Laterality: Bilateral;  . TOTAL MASTECTOMY Bilateral 09/11/2012   Procedure: bilateral MASTECTOMY;  Surgeon: Odis Hollingshead, MD;  Location: [place] [facility];  Service: General;  Laterality: Bilateral;  .  VULVECTOMY N/A 07/14/2013   Procedure: WIDE LOCAL  EXCISION VULVAR;  Surgeon: Alvino Chapel, MD;  Location: [facility];  Service: Gynecology;  Laterality: N/A;   Family History  Problem Relation Age of Onset  . Breast cancer Paternal Aunt        dx in her 83s  . Lymphoma Paternal Uncle   . Breast cancer Paternal Grandmother        dx >50  . Heart attack Paternal Grandfather   . Breast cancer Other        2 maternal great aunts with breast cancer >50  . Osteoporosis Mother        controlled with diet/exercise  . COPD Father    Social History  Substance Use Topics  . Smoking status: Never Smoker  . Smokeless tobacco: Never Used  . Alcohol use No   OB History    No data available     Review of Systems  Constitutional: Negative for chills and fever.  HENT: Positive for congestion, postnasal drip, rhinorrhea and sore throat (scratchy). Negative for ear pain, trouble swallowing and voice change.   Respiratory: Positive for cough. Negative for shortness of breath.   Cardiovascular: Negative for chest pain and palpitations.  Gastrointestinal: Negative for abdominal pain, diarrhea, nausea and vomiting.  Musculoskeletal: Negative for arthralgias, back pain and myalgias.  Skin: Negative for rash.    Allergies  Ciprofloxacin; Macrobid [nitrofurantoin]; Dilaudid [hydromorphone hcl]; and Gluten meal  Home Medications   Prior to Admission medications   Medication Sig Start Date End Date Taking? Authorizing Provider  alendronate (FOSAMAX) 70 MG tablet TAKE 1 TABLET BY MOUTH EVERY WEEK 12/27/15   Nicholas Lose, MD  Cholecalciferol (VITAMIN D-3 PO) Take 1 tablet by mouth daily.     [provider]  guaiFENesin-codeine (ROBITUSSIN AC) 100-10 MG/5ML syrup Take 5-10 mLs by mouth 3 (three) times daily as needed for cough or congestion. 11/26/16   Noland Fordyce, PA-C  levothyroxine (SYNTHROID, LEVOTHROID) 25 MCG tablet TAKE 1 TABLET (25 MCG TOTAL) BY MOUTH DAILY. 08/03/16    Marin Olp, MD  liothyronine (CYTOMEL) 25 MCG tablet TAKE 1/2 TABLET BY MOUTH EVERY DAY 06/06/16   Marin Olp, MD  Multiple Vitamin (MULTIVITAMIN) tablet Take 1 tablet by mouth daily.    [provider]  venlafaxine XR (EFFEXOR-XR) 150 MG 24 hr capsule Take 1 capsule (150 mg total) by mouth daily with breakfast. 08/29/16   Nicholas Lose, MD  Vitamin D, Ergocalciferol, (DRISDOL) 50000 UNITS CAPS capsule Take 1 capsule (50,000 Units total) by mouth every Thursday. 07/01/15   Angiulli, Lavon Paganini, PA-C   Meds Ordered and Administered this Visit  Medications - No data to display  BP (!) 143/92 (BP Location: Left Arm)   Pulse 93   Temp 98.5 F (36.9 C) (Oral)   Ht 5\' 2"  (1.575 m)   Wt 163 lb (73.9 kg)   LMP 09/11/2012   SpO2 96%   BMI 29.81 kg/m  No data found.   Physical Exam  Constitutional: She appears well-developed and well-nourished. No distress.  HENT:  Head: Normocephalic and atraumatic.  Right Ear: Tympanic membrane normal.  Left  Ear: Tympanic membrane normal.  Nose: Nose normal.  Mouth/Throat: Uvula is midline, oropharynx is clear and moist and mucous membranes are normal.  Eyes: Conjunctivae are normal. No scleral icterus.  Neck: Normal range of motion.  Cardiovascular: Normal rate and regular rhythm.   Pulmonary/Chest: Effort normal and breath sounds normal. No respiratory distress. She has no wheezes. She has no rales. She exhibits no tenderness.  Abdominal: Soft. She exhibits no distension. There is no tenderness.  Musculoskeletal: Normal range of motion.  Neurological: She is alert.  Skin: Skin is warm and dry. She is not diaphoretic.  Nursing note and vitals reviewed.   Urgent Care Course     Procedures (including critical care time)  Labs Review Labs Reviewed - No data to display  Imaging Review Dg Chest 2 View  Result Date: 11/26/2016 CLINICAL DATA:  61 year old female with cough for 1 week. Exposure to husband who recently was  diagnosed with pneumonia. Nonsmoker. Breast cancer post mastectomy. Initial encounter. EXAM: CHEST  2 VIEW COMPARISON:  06/07/2015. FINDINGS: Post breast surgery/reconstruction. Minimal scarring projects over the heart. No infiltrate, congestive heart failure or pneumothorax. Minimal apical pleural scarring stable. No plain film evidence of pulmonary malignancy. Heart size within normal limits. No acute osseous abnormality. IMPRESSION: No active cardiopulmonary disease. Electronically Signed   By: Genia Del M.D.   On: 11/26/2016 11:14    MDM   1. Viral URI with cough    Hx and exam c/w viral illness  Rx: Virtussin (pt states Tessalon has not helped in the past)  Encouraged fluids and rest. f/u with PCP in 1 week as needed.     Noland Fordyce, PA-C 11/26/16 [phone]

## 2016-12-15 ENCOUNTER — Other Ambulatory Visit: Payer: Self-pay | Admitting: Hematology and Oncology

## 2016-12-15 DIAGNOSIS — C50311 Malignant neoplasm of lower-inner quadrant of right female breast: Secondary | ICD-10-CM

## 2017-01-23 DIAGNOSIS — M1732 Unilateral post-traumatic osteoarthritis, left knee: Secondary | ICD-10-CM | POA: Diagnosis not present

## 2017-02-28 DIAGNOSIS — H109 Unspecified conjunctivitis: Secondary | ICD-10-CM | POA: Diagnosis not present

## 2017-04-12 ENCOUNTER — Ambulatory Visit (INDEPENDENT_AMBULATORY_CARE_PROVIDER_SITE_OTHER): Payer: BLUE CROSS/BLUE SHIELD | Admitting: Osteopathic Medicine

## 2017-04-12 ENCOUNTER — Encounter: Payer: Self-pay | Admitting: Osteopathic Medicine

## 2017-04-12 VITALS — BP 136/86 | HR 97 | Ht 66.0 in | Wt 166.0 lb

## 2017-04-12 DIAGNOSIS — R7301 Impaired fasting glucose: Secondary | ICD-10-CM | POA: Diagnosis not present

## 2017-04-12 DIAGNOSIS — J069 Acute upper respiratory infection, unspecified: Secondary | ICD-10-CM | POA: Diagnosis not present

## 2017-04-12 DIAGNOSIS — Z8639 Personal history of other endocrine, nutritional and metabolic disease: Secondary | ICD-10-CM | POA: Diagnosis not present

## 2017-04-12 DIAGNOSIS — B009 Herpesviral infection, unspecified: Secondary | ICD-10-CM | POA: Insufficient documentation

## 2017-04-12 DIAGNOSIS — Z8659 Personal history of other mental and behavioral disorders: Secondary | ICD-10-CM | POA: Diagnosis not present

## 2017-04-12 DIAGNOSIS — D0510 Intraductal carcinoma in situ of unspecified breast: Secondary | ICD-10-CM | POA: Insufficient documentation

## 2017-04-12 DIAGNOSIS — E034 Atrophy of thyroid (acquired): Secondary | ICD-10-CM

## 2017-04-12 DIAGNOSIS — Z0000 Encounter for general adult medical examination without abnormal findings: Secondary | ICD-10-CM | POA: Diagnosis not present

## 2017-04-12 DIAGNOSIS — R946 Abnormal results of thyroid function studies: Secondary | ICD-10-CM | POA: Diagnosis not present

## 2017-04-12 DIAGNOSIS — R7989 Other specified abnormal findings of blood chemistry: Secondary | ICD-10-CM

## 2017-04-12 DIAGNOSIS — M810 Age-related osteoporosis without current pathological fracture: Secondary | ICD-10-CM | POA: Diagnosis not present

## 2017-04-12 DIAGNOSIS — Z23 Encounter for immunization: Secondary | ICD-10-CM

## 2017-04-12 DIAGNOSIS — B9789 Other viral agents as the cause of diseases classified elsewhere: Secondary | ICD-10-CM

## 2017-04-12 DIAGNOSIS — Z780 Asymptomatic menopausal state: Secondary | ICD-10-CM

## 2017-04-12 DIAGNOSIS — E611 Iron deficiency: Secondary | ICD-10-CM

## 2017-04-12 MED ORDER — BENZONATATE 200 MG PO CAPS: 200.0000 mg | ORAL_CAPSULE | Freq: Three times a day (TID) | ORAL | 0 refills | Status: DC | PRN

## 2017-04-12 MED ORDER — ONDANSETRON HCL 8 MG PO TABS: 8.0000 mg | ORAL_TABLET | Freq: Three times a day (TID) | ORAL | 1 refills | Status: DC | PRN

## 2017-04-12 MED ORDER — VENLAFAXINE HCL ER 150 MG PO CP24: 150.0000 mg | ORAL_CAPSULE | Freq: Every day | ORAL | 3 refills | Status: DC

## 2017-04-12 MED ORDER — IPRATROPIUM BROMIDE 0.06 % NA SOLN: 2.0000 | Freq: Four times a day (QID) | NASAL | 1 refills | Status: DC

## 2017-04-12 MED ORDER — VENLAFAXINE HCL ER 150 MG PO CP24: 150.0000 mg | ORAL_CAPSULE | Freq: Every day | ORAL | 1 refills | Status: DC

## 2017-04-12 NOTE — Patient Instructions (Addendum)
Over-the-Counter Medications & Home Remedies for Upper Respiratory Illness  Note: the following list assumes no pregnancy, normal liver & kidney function and no other drug interactions. Dr. Roen Macgowan has highlighted medications which are safe for you to use, but these may not be appropriate for everyone. Always ask a pharmacist or qualified medical provider if you have any questions!   Aches/Pains, Fever, Headache Acetaminophen (Tylenol) 500 mg tablets - take max 2 tablets (1000 mg) every 6 hours (4 times per day)  Ibuprofen (Motrin) 200 mg tablets - take max 4 tablets (800 mg) every 6 hours*  Sinus Congestion Prescription Atrovent as directed Nasal Saline if desired Oxymetolazone (Afrin, others) sparing use due to rebound congestion, NEVER use in kids Phenylephrine (Sudafed) 10 mg tablets every 4 hours (or the 12-hour formulation)* Diphenhydramine (Benadryl) 25 mg tablets - take max 2 tablets every 4 hours  Cough & Sore Throat Prescription cough pills or syrups as directed Dextromethorphan (Robitussin, others) - cough suppressant Guaifenesin (Robitussin, Mucinex, others) - expectorant (helps cough up mucus) (Dextromethorphan and Guaifenesin also come in a combination tablet) Lozenges w/ Benzocaine + Menthol (Cepacol) Honey - as much as you want! Teas which "coat the throat" - look for ingredients Elm Bark, Licorice Root, Marshmallow Root  Other Antibiotics if these are prescribed - take ALL, even if you're feeling better  Zinc Lozenges within 24 hours of symptoms onset - mixed evidence this shortens the duration of the common cold Don't waste your money on Vitamin C or Echinacea  *Caution in patients with high blood pressure   

## 2017-04-12 NOTE — Progress Notes (Addendum)
HPI: Nancy Beard is a 61 y.o. female  who presents to [facility] today, 04/12/17,  for chief complaint of:  Chief Complaint  Patient presents with  . Establish Care    ANNUAL, COUGH, AND HOT FLASHES    Very pleasant patient here to establish care. Overdue for routine physical and would like to get some labs.  Has some concerns about her antidepressant medication. Has been on Effexor for many years and ran out of prescription a few weeks ago. Since then has noticed increasing hot flashes and mood swings. Has been on this for some time and it has helped her mood and irritability. She is some concerns that her husband doesn't seem to think she needs to be on antidepressants, she states he is "old school" but she feels she does better on the medicines than not.  History of breast cancer, finished treatment. Years ago. Occasionally will have some nausea and takes the ondansetron that she was given after chemotherapy but has been out of this and the like refill.  History of osteoporosis: Has been on Fosamax weekly for several years. Overdue for repeat bone density test.  Hypothyroid: Has been sometime since levels were checked. She takes Synthroid and Cytomel.  Cough ongoing for a few days, little bit of a scratchy throat. No fever/chills.    Past medical, surgical, social and family history reviewed: Patient Active Problem List   Diagnosis Date Noted  . Swelling   . Hyperglycemia   . Transaminitis 06/27/2015  . Thrombocytosis ([place]) 06/27/2015  . Iron deficiency 06/24/2015  . Bacterial UTI 06/24/2015  . Breast cancer ([place])   . UTI (urinary tract infection) 06/22/2015  . Vitamin D insufficiency 06/20/2015  . Fracture of left tibial plateau 06/15/2015  . Fracture tibia/fibula 06/14/2015  . Genital herpes 09/06/2014  . Neuropathy 09/06/2014  . Anticoagulant long-term use 05/01/2014  . Persistent headaches 02/22/2014  . Hot flashes related to  aromatase inhibitor therapy 12/10/2013  . Vulvar intraepithelial neoplasia III (VIN III) 06/30/2013  . DVT (deep venous thrombosis) ([place]) 06/30/2013  . Osteoporosis 05/13/2013  . Primary cancer of lower-inner quadrant of right female breast ([place]) 06/24/2012  . Hyperlipidemia 05/13/2008  . Hypothyroidism 01/31/2007  . Depression 01/31/2007  . Celiac disease/sprue 01/31/2007   Past Surgical History:  Procedure Laterality Date  . AXILLARY SENTINEL NODE BIOPSY Right 09/11/2012   Procedure: AXILLARY SENTINEL lymph NODE  BIOPSY;  Surgeon: Odis Hollingshead, MD;  Location: [facility];  Service: General;  Laterality: Right;  right nuclear medicine injection 12:30   . BREAST LUMPECTOMY     benign lump  . BREAST RECONSTRUCTION Right 09/24/2013   Procedure: REVISION OF RIGHT BREAST RECONSTRUCTION WITH REPOSITIONING RIGHT IMPLANT, POSSIBLE EXCISION CAPSULAR CONTRACTURE AND LIPOFILLING FOR FAT GRAFTING;  Surgeon: Theodoro Kos, DO;  Location: [facility];  Service: Plastics;  Laterality: Right;  . BREAST RECONSTRUCTION WITH PLACEMENT OF TISSUE EXPANDER AND FLEX HD (ACELLULAR HYDRATED DERMIS) Bilateral 09/11/2012   Procedure: BREAST RECONSTRUCTION WITH PLACEMENT OF TISSUE EXPANDER AND FLEX HD (ACELLULAR HYDRATED DERMIS) ADM;  Surgeon: Theodoro Kos, DO;  Location: [facility];  Service: Plastics;  Laterality: Bilateral;  . COLONOSCOPY  2006  . cryoblation of cervix     long time ago  . ESOPHAGOGASTRODUODENOSCOPY  2007   sprue  . EXTERNAL FIXATION LEG Left 06/15/2015   Procedure: EXTERNAL FIXATION LEG;  Surgeon: Altamese Milton, MD;  Location: [facility];  Service: Orthopedics;  Laterality: Left;  . INCISION AND  DRAINAGE OF WOUND Left 09/16/2012   Procedure: Left Breast Evacuation of Hematoma;  Surgeon: Theodoro Kos, DO;  Location: [facility];  Service: Plastics;  Laterality: Left;  . LIPOSUCTION WITH LIPOFILLING Bilateral 09/24/2013   Procedure: LIPOSUCTION WITH LIPOFILLING;  Surgeon: Theodoro Kos, DO;  Location:  [facility];  Service: Plastics;  Laterality: Bilateral;  Biltalteal filling breast  . ORIF TIBIA PLATEAU Left 06/21/2015   Procedure: OPEN REDUCTION INTERNAL FIXATION (ORIF) TIBIAL PLATEAU REMOVAL OF EXTERNAL FISTULA;  Surgeon: Altamese Morrison, MD;  Location: [facility];  Service: Orthopedics;  Laterality: Left;  . PORT-A-CATH REMOVAL Right 08/07/2013   Procedure: REMOVAL PORT-A-CATH;  Surgeon: Odis Hollingshead, MD;  Location: [facility];  Service: General;  Laterality: Right;  . PORTACATH PLACEMENT Right 10/09/2012   Procedure: US GUIDED INSERTION PORT-A-CATH;  Surgeon: Odis Hollingshead, MD;  Location: [facility];  Service: General;  Laterality: Right;  Right Subclavian Vein  . REMOVAL OF BILATERAL TISSUE EXPANDERS WITH PLACEMENT OF BILATERAL BREAST IMPLANTS Bilateral 06/24/2013   Procedure: REMOVAL OF BILATERAL TISSUE EXPANDERS WITH PLACEMENT OF BILATERAL BREAST IMPLANTS;  Surgeon: Theodoro Kos, DO;  Location: [facility];  Service: Plastics;  Laterality: Bilateral;  . TOTAL MASTECTOMY Bilateral 09/11/2012   Procedure: bilateral MASTECTOMY;  Surgeon: Odis Hollingshead, MD;  Location: [facility];  Service: General;  Laterality: Bilateral;  . VULVECTOMY N/A 07/14/2013   Procedure: WIDE LOCAL  EXCISION VULVAR;  Surgeon: Alvino Chapel, MD;  Location: [facility];  Service: Gynecology;  Laterality: N/A;   Social History  Substance Use Topics  . Smoking status: Never Smoker  . Smokeless tobacco: Never Used  . Alcohol use No   Family History  Problem Relation Age of Onset  . Breast cancer Paternal Aunt        dx in her 75s  . Lymphoma Paternal Uncle   . Breast cancer Paternal Grandmother        dx >50  . Heart attack Paternal Grandfather   . Breast cancer Other        2 maternal great aunts with breast cancer >50  . Osteoporosis Mother        controlled with diet/exercise  . COPD Father      Current medication list and  allergy/intolerance information reviewed:   Current Outpatient Prescriptions  Medication Sig Dispense Refill  . alendronate (FOSAMAX) 70 MG tablet TAKE 1 TABLET BY MOUTH EVERY WEEK 12 tablet 3  . Cholecalciferol (VITAMIN D-3 PO) Take 1 tablet by mouth daily.     [name] guaiFENesin-codeine (ROBITUSSIN AC) 100-10 MG/5ML syrup Take 5-10 mLs by mouth 3 (three) times daily as needed for cough or congestion. 80 mL 0  . levothyroxine (SYNTHROID, LEVOTHROID) 25 MCG tablet TAKE 1 TABLET (25 MCG TOTAL) BY MOUTH DAILY. 90 tablet 3  . liothyronine (CYTOMEL) 25 MCG tablet TAKE 1/2 TABLET BY MOUTH EVERY DAY 45 tablet 3  . Multiple Vitamin (MULTIVITAMIN) tablet Take 1 tablet by mouth daily.    [name] venlafaxine XR (EFFEXOR-XR) 150 MG 24 hr capsule Take 1 capsule (150 mg total) by mouth daily with breakfast. 90 capsule 1  . Vitamin D, Ergocalciferol, (DRISDOL) 50000 UNITS CAPS capsule Take 1 capsule (50,000 Units total) by mouth every Thursday. 30 capsule 1   No current facility-administered medications for this visit.    Allergies  Allergen Reactions  . Ciprofloxacin Itching and Other (See Comments)    Patient states she had muscle spasms  . Macrobid [Nitrofurantoin] Other (  See Comments)    Significant transaminitis  . Dilaudid [Hydromorphone Hcl] Itching  . Gluten Meal Other (See Comments)      Review of Systems:  Constitutional:  No  fever, no chills, No recent illness, No unintentional weight changes. No significant fatigue.   HEENT: No  headache, no vision change, no hearing change, +sore throat, +sinus pressure  Cardiac: No  chest pain, No  pressure, No palpitations, No  Orthopnea  Respiratory:  No  shortness of breath. +Cough  Gastrointestinal: No  abdominal pain, No  nausea, No  vomiting,  No  blood in stool, No  diarrhea, No  constipation    Musculoskeletal: No new myalgia/arthralgia  Genitourinary: No  incontinence, No  abnormal genital bleeding, No abnormal genital discharge  Skin: No   Rash, No other wounds/concerning lesions  Hem/Onc: No  easy bruising/bleeding  Endocrine: No cold intolerance,  +heat intolerance. No polyuria/polydipsia/polyphagia   Neurologic: No  weakness, No  dizziness, No  slurred speech/focal weakness/facial droop  Psychiatric: +concerns with depression, No  concerns with anxiety, No sleep problems, No mood problems  Exam:  BP 136/86   Pulse 97   Ht 5\' 6"  (1.676 m)   Wt 166 lb (75.3 kg)   LMP 09/11/2012   BMI 26.79 kg/m   Constitutional: VS see above. General Appearance: alert, well-developed, well-nourished, NAD  Eyes: Normal lids and conjunctive, non-icteric sclera  Ears, Nose, Mouth, Throat: MMM, Normal external inspection ears/nares/mouth/lips/gums. TM normal bilaterally. Pharynx/tonsils no erythema, no exudate. Nasal mucosa normal.   Neck: No masses, trachea midline. No thyroid enlargement. No tenderness/mass appreciated. No lymphadenopathy  Respiratory: Normal respiratory effort. no wheeze, no rhonchi, no rales  Cardiovascular: S1/S2 normal, no murmur, no rub/gallop auscultated. RRR. No lower extremity edema.   Musculoskeletal: Gait normal. No clubbing/cyanosis of digits.   Neurological: Normal balance/coordination. No tremor  Skin: warm, dry, intact  Psychiatric: Normal judgment/insight. Normal mood and affect. Oriented x3.     ASSESSMENT/PLAN:   Postmenopausal - Likely on Effexor for postmenopausal symptoms as well as depression. Restart this. - Plan: venlafaxine XR (EFFEXOR-XR) 150 MG 24 hr capsule, VITAMIN D 25 Hydroxy (Vit-D Deficiency, Fractures)  Hypothyroidism due to acquired atrophy of thyroid - Hold off on thyroid medication refill until we have levels - Plan: TSH  History of depression - Plan: venlafaxine XR (EFFEXOR-XR) 150 MG 24 hr capsule  Osteoporosis without current pathological fracture, unspecified osteoporosis type - Plan: VITAMIN D 25 Hydroxy (Vit-D Deficiency, Fractures)  Iron deficiency - Plan:  CBC, Fe+TIBC+Fer  Annual physical exam - Plan: CBC, COMPLETE METABOLIC PANEL WITH GFR, Lipid panel, TSH, Fe+TIBC+Fer  History of high cholesterol - Plan: Lipid panel  Viral URI with cough - Plan: ipratropium (ATROVENT) 0.06 % nasal spray, benzonatate (TESSALON) 200 MG capsule  Need for immunization against influenza - Plan: Flu Vaccine QUAD 36+ mos IM    Patient Instructions  Over-the-Counter Medications & Home Remedies for Upper Respiratory Illness  Note: the following list assumes no pregnancy, normal liver & kidney function and no other drug interactions. Dr. Sheppard Coil has highlighted medications which are safe for you to use, but these may not be appropriate for everyone. Always ask a pharmacist or qualified medical provider if you have any questions!   Aches/Pains, Fever, Headache Acetaminophen (Tylenol) 500 mg tablets - take max 2 tablets (1000 mg) every 6 hours (4 times per day)  Ibuprofen (Motrin) 200 mg tablets - take max 4 tablets (800 mg) every 6 hours*  Sinus Congestion Prescription Atrovent as  directed Nasal Saline if desired Oxymetolazone (Afrin, others) sparing use due to rebound congestion, NEVER use in kids Phenylephrine (Sudafed) 10 mg tablets every 4 hours (or the 12-hour formulation)* Diphenhydramine (Benadryl) 25 mg tablets - take max 2 tablets every 4 hours  Cough & Sore Throat Prescription cough pills or syrups as directed Dextromethorphan (Robitussin, others) - cough suppressant Guaifenesin (Robitussin, Mucinex, others) - expectorant (helps cough up mucus) (Dextromethorphan and Guaifenesin also come in a combination tablet) Lozenges w/ Benzocaine + Menthol (Cepacol) Honey - as much as you want! Teas which "coat the throat" - look for ingredients Elm Bark, Licorice Root, Marshmallow Root  Other Antibiotics if these are prescribed - take ALL, even if you're feeling better  Zinc Lozenges within 24 hours of symptoms onset - mixed evidence this shortens the  duration of the common cold Don't waste your money on Vitamin C or Echinacea  *Caution in patients with high blood pressure       Visit summary with medication list and pertinent instructions was printed for patient to review. All questions at time of visit were answered - patient instructed to contact office with any additional concerns. ER/RTC precautions were reviewed with the patient. Follow-up plan: Return in about 4 weeks (around 05/10/2017) for [facility] .  Note: Total time spent 45 minutes, greater than 50% of the visit was spent face-to-face counseling and coordinating care for the following: The primary encounter diagnosis was Postmenopausal. Diagnoses of Hypothyroidism due to acquired atrophy of thyroid, History of depression, Osteoporosis without current pathological fracture, unspecified osteoporosis type, Iron deficiency, Annual physical exam, History of high cholesterol, Viral URI with cough, and Need for immunization against influenza were also pertinent to this visit..   Addendum 04/15/17   Results for orders placed or performed in visit on 04/12/17 (from the past 72 hour(s))  CBC     Status: Abnormal   Collection Time: 04/12/17 11:09 AM  Result Value Ref Range   WBC 7.3 3.8 - 10.8 Thousand/uL   RBC 5.14 (H) 3.80 - 5.10 Million/uL   Hemoglobin 13.8 11.7 - 15.5 g/dL   HCT 41.8 35.0 - 45.0 %   MCV 81.3 80.0 - 100.0 fL   MCH 26.8 (L) 27.0 - 33.0 pg   MCHC 33.0 32.0 - 36.0 g/dL   RDW 13.3 11.0 - 15.0 %   Platelets 303 140 - 400 Thousand/uL   MPV 11.9 7.5 - 12.5 fL  COMPLETE METABOLIC PANEL WITH GFR     Status: Abnormal   Collection Time: 04/12/17 11:09 AM  Result Value Ref Range   Glucose, Bld 102 (H) 65 - 99 mg/dL    Comment: .            Fasting reference interval . For someone without known diabetes, a glucose value between 100 and 125 mg/dL is consistent with prediabetes and should be confirmed with a follow-up test. .    BUN 14 7 - 25 mg/dL    Creat 0.61 0.50 - 0.99 mg/dL    Comment: For patients >85 years of age, the reference limit for Creatinine is approximately 13% higher for people identified as African-American. .    GFR, Est Non African American 97 > OR = 60 mL/min/1.55m2   GFR, Est African American 113 > OR = 60 mL/min/1.59m2   BUN/Creatinine Ratio NOT APPLICABLE 6 - 22 (calc)   Sodium 139 135 - 146 mmol/L   Potassium 4.6 3.5 - 5.3 mmol/L   Chloride 106 98 - 110 mmol/L  CO2 21 20 - 32 mmol/L   Calcium 9.1 8.6 - 10.4 mg/dL   Total Protein 7.1 6.1 - 8.1 g/dL   Albumin 4.4 3.6 - 5.1 g/dL   Globulin 2.7 1.9 - 3.7 g/dL (calc)   AG Ratio 1.6 1.0 - 2.5 (calc)   Total Bilirubin 0.5 0.2 - 1.2 mg/dL   Alkaline phosphatase (APISO) 134 (H) 33 - 130 U/L   AST 29 10 - 35 U/L   ALT 31 (H) 6 - 29 U/L  Lipid panel     Status: Abnormal   Collection Time: 04/12/17 11:09 AM  Result Value Ref Range   Cholesterol 205 (H) <200 mg/dL   HDL 43 (L) >50 mg/dL   Triglycerides 148 <150 mg/dL   LDL Cholesterol (Calc) 135 (H) mg/dL (calc)    Comment: Reference range: <100 . Desirable range <100 mg/dL for primary prevention;   <70 mg/dL for patients with CHD or diabetic patients  with > or = 2 CHD risk factors. [name] LDL-C is now calculated using the Martin-Hopkins  calculation, which is a validated novel method providing  better accuracy than the Friedewald equation in the  estimation of LDL-C.  Cresenciano Genre et al. Annamaria Helling. 2706;237(62): 2061-2068  (http://education.[facility].com/faq/FAQ164)    Total CHOL/HDL Ratio 4.8 <5.0 (calc)   Non-HDL Cholesterol (Calc) 162 (H) <130 mg/dL (calc)    Comment: For patients with diabetes plus 1 major ASCVD risk  factor, treating to a non-HDL-C goal of <100 mg/dL  (LDL-C of <70 mg/dL) is considered a therapeutic  option.   TSH     Status: Abnormal   Collection Time: 04/12/17 11:09 AM  Result Value Ref Range   TSH 6.29 (H) 0.40 - 4.50 mIU/L  Fe+TIBC+Fer     Status: Abnormal   Collection  Time: 04/12/17 11:09 AM  Result Value Ref Range   Iron 80 45 - 160 mcg/dL   TIBC 458 (H) 250 - 450 mcg/dL (calc)   %SAT 17 11 - 50 % (calc)   Ferritin 9 (L) 20 - 288 ng/mL  VITAMIN D 25 Hydroxy (Vit-D Deficiency, Fractures)     Status: Abnormal   Collection Time: 04/12/17 11:09 AM  Result Value Ref Range   Vit D, 25-Hydroxy 28 (L) 30 - 100 ng/mL    Comment: Vitamin D Status         25-OH Vitamin D: . Deficiency:                    <20 ng/mL Insufficiency:             20 - 29 ng/mL Optimal:                 > or = 30 ng/mL . For 25-OH Vitamin D testing on patients on  D2-supplementation and patients for whom quantitation  of D2 and D3 fractions is required, the [facility]ureD(TM) 25-OH VIT D, (D2,D3), LC/MS/MS is recommended: order  code [phone] (patients >43yrs). . For more information on this test, go to: http://education.[facility].com/faq/FAQ163 (This link is being provided for  informational/educational purposes only.)      Abnormal TSH - Plan: T4, free  Elevated fasting glucose - Plan: Hemoglobin A1c

## 2017-04-15 ENCOUNTER — Telehealth: Payer: Self-pay

## 2017-04-15 MED ORDER — GUAIFENESIN-CODEINE 100-10 MG/5ML PO SYRP: 5.0000 mL | ORAL_SOLUTION | Freq: Four times a day (QID) | ORAL | 0 refills | Status: DC | PRN

## 2017-04-15 NOTE — Telephone Encounter (Signed)
Sent in stronger cough medicine. If symptoms overall persist past 10 days or if develops fever or other concerning symptoms, please come into the clinic for chest x-ray

## 2017-04-15 NOTE — Addendum Note (Signed)
Addended by: Maryla Morrow on: 04/15/2017 07:52 AM   Modules accepted: Orders

## 2017-04-15 NOTE — Telephone Encounter (Signed)
Nancy Beard states her cough is worse and is now productive with a clear sputum. She states the tessalon is not helping with the cough. She would like a different medication. Denies wheezing, fever, chills, sweats or any other new symptoms.

## 2017-04-16 ENCOUNTER — Telehealth: Payer: Self-pay

## 2017-04-16 LAB — VITAMIN D 25 HYDROXY (VIT D DEFICIENCY, FRACTURES): Vit D, 25-Hydroxy: 28 ng/mL — ABNORMAL LOW (ref 30–100)

## 2017-04-16 LAB — CBC
HCT: 41.8 % (ref 35.0–45.0)
Hemoglobin: 13.8 g/dL (ref 11.7–15.5)
MCH: 26.8 pg — ABNORMAL LOW (ref 27.0–33.0)
MCHC: 33 g/dL (ref 32.0–36.0)
MCV: 81.3 fL (ref 80.0–100.0)
MPV: 11.9 fL (ref 7.5–12.5)
Platelets: 303 10*3/uL (ref 140–400)
RBC: 5.14 10*6/uL — ABNORMAL HIGH (ref 3.80–5.10)
RDW: 13.3 % (ref 11.0–15.0)
WBC: 7.3 10*3/uL (ref 3.8–10.8)

## 2017-04-16 LAB — COMPLETE METABOLIC PANEL WITH GFR
AG Ratio: 1.6 (calc) (ref 1.0–2.5)
ALT: 31 U/L — ABNORMAL HIGH (ref 6–29)
AST: 29 U/L (ref 10–35)
Albumin: 4.4 g/dL (ref 3.6–5.1)
Alkaline phosphatase (APISO): 134 U/L — ABNORMAL HIGH (ref 33–130)
BUN: 14 mg/dL (ref 7–25)
CO2: 21 mmol/L (ref 20–32)
Calcium: 9.1 mg/dL (ref 8.6–10.4)
Chloride: 106 mmol/L (ref 98–110)
Creat: 0.61 mg/dL (ref 0.50–0.99)
GFR, Est African American: 113 mL/min/{1.73_m2} (ref >=60)
GFR, Est Non African American: 97 mL/min/{1.73_m2} (ref >=60)
Globulin: 2.7 g/dL (calc) (ref 1.9–3.7)
Glucose, Bld: 102 mg/dL — ABNORMAL HIGH (ref 65–99)
Potassium: 4.6 mmol/L (ref 3.5–5.3)
Sodium: 139 mmol/L (ref 135–146)
Total Bilirubin: 0.5 mg/dL (ref 0.2–1.2)
Total Protein: 7.1 g/dL (ref 6.1–8.1)

## 2017-04-16 LAB — HEMOGLOBIN A1C W/OUT EAG: Hgb A1c MFr Bld: 5.6 % of total Hgb (ref <5.7)

## 2017-04-16 LAB — TSH: TSH: 6.29 mIU/L — ABNORMAL HIGH (ref 0.40–4.50)

## 2017-04-16 LAB — LIPID PANEL
Cholesterol: 205 mg/dL — ABNORMAL HIGH (ref <200)
HDL: 43 mg/dL — ABNORMAL LOW (ref >50)
LDL Cholesterol (Calc): 135 mg/dL (calc) — ABNORMAL HIGH
Non-HDL Cholesterol (Calc): 162 mg/dL (calc) — ABNORMAL HIGH (ref <130)
Total CHOL/HDL Ratio: 4.8 (calc) (ref <5.0)
Triglycerides: 148 mg/dL (ref <150)

## 2017-04-16 LAB — TEST AUTHORIZATION

## 2017-04-16 LAB — IRON,TIBC AND FERRITIN PANEL
%SAT: 17 % (calc) (ref 11–50)
Ferritin: 9 ng/mL — ABNORMAL LOW (ref 20–288)
Iron: 80 ug/dL (ref 45–160)
TIBC: 458 mcg/dL (calc) — ABNORMAL HIGH (ref 250–450)

## 2017-04-16 LAB — T4, FREE: Free T4: 0.8 ng/dL (ref 0.8–1.8)

## 2017-04-16 NOTE — Telephone Encounter (Signed)
Opened in error. See other telephone note

## 2017-04-16 NOTE — Telephone Encounter (Signed)
Patient has been informed. Hallis Meditz,CMA  

## 2017-04-17 ENCOUNTER — Encounter: Payer: Self-pay | Admitting: Osteopathic Medicine

## 2017-04-17 ENCOUNTER — Ambulatory Visit (INDEPENDENT_AMBULATORY_CARE_PROVIDER_SITE_OTHER): Payer: BLUE CROSS/BLUE SHIELD

## 2017-04-17 ENCOUNTER — Ambulatory Visit (INDEPENDENT_AMBULATORY_CARE_PROVIDER_SITE_OTHER): Payer: BLUE CROSS/BLUE SHIELD | Admitting: Osteopathic Medicine

## 2017-04-17 VITALS — BP 135/88 | HR 109 | Temp 97.5°F | Ht 65.5 in | Wt 165.3 lb

## 2017-04-17 DIAGNOSIS — R059 Cough, unspecified: Secondary | ICD-10-CM

## 2017-04-17 DIAGNOSIS — R0989 Other specified symptoms and signs involving the circulatory and respiratory systems: Secondary | ICD-10-CM

## 2017-04-17 DIAGNOSIS — R05 Cough: Secondary | ICD-10-CM

## 2017-04-17 DIAGNOSIS — J40 Bronchitis, not specified as acute or chronic: Secondary | ICD-10-CM

## 2017-04-17 MED ORDER — PREDNISONE 20 MG PO TABS: 20.0000 mg | ORAL_TABLET | Freq: Two times a day (BID) | ORAL | 0 refills | Status: DC

## 2017-04-17 MED ORDER — AZITHROMYCIN 250 MG PO TABS
ORAL_TABLET | ORAL | 0 refills | Status: DC
Start: 2017-04-17 — End: 2017-05-10

## 2017-04-17 NOTE — Telephone Encounter (Signed)
Patient would like to go ahead and have a chest X-ray. She states the cough is getting worse. Please advise.

## 2017-04-17 NOTE — Telephone Encounter (Signed)
Pt advised, transferred to scheduling for appointment.

## 2017-04-17 NOTE — Progress Notes (Signed)
HPI: Nancy Beard is a 61 y.o. female  who presents to [facility] today, 04/17/17,  for chief complaint of:  Chief Complaint  Patient presents with  . Cough    Cough/sinus congestion seems to be getting a bit worse. About 7 days altogether at this point. Tessalon Perles were not very helpful, codeine cough syrup was a bit better. No fever, no shortness of breath, no productive cough.   Past medical history, surgical history, social history and family history reviewed.  Patient Active Problem List   Diagnosis Date Noted  . Herpes simplex 04/12/2017  . Intraductal carcinoma in situ of breast 04/12/2017  . Swelling   . Hyperglycemia   . Transaminitis 06/27/2015  . Thrombocytosis ([place]) 06/27/2015  . Iron deficiency 06/24/2015  . Breast cancer ([place])   . Vitamin D insufficiency 06/20/2015  . Fracture of left tibial plateau 06/15/2015  . Fracture tibia/fibula 06/14/2015  . Genital herpes 09/06/2014  . Neuropathy 09/06/2014  . Anticoagulant long-term use 05/01/2014  . Persistent headaches 02/22/2014  . Hot flashes related to aromatase inhibitor therapy 12/10/2013  . Vulvar intraepithelial neoplasia III (VIN III) 06/30/2013  . Status post bilateral breast reconstruction 06/30/2013  . Osteoporosis 05/13/2013  . Acquired absence of bilateral breasts and nipples 09/23/2012  . Primary cancer of lower-inner quadrant of right female breast ([place]) 06/24/2012  . Hyperlipidemia 05/13/2008  . Hypothyroidism 01/31/2007  . Depression 01/31/2007  . Celiac disease/sprue 01/31/2007    Current medication list and allergy/intolerance information reviewed.   Current Outpatient Prescriptions on File Prior to Visit  Medication Sig Dispense Refill  . alendronate (FOSAMAX) 70 MG tablet TAKE 1 TABLET BY MOUTH EVERY WEEK 12 tablet 3  . benzonatate (TESSALON) 200 MG capsule Take 1 capsule (200 mg total) by mouth 3 (three) times daily as needed for cough. 20 capsule  0  . Cholecalciferol (VITAMIN D-3 PO) Take 1 tablet by mouth daily.     [name] guaiFENesin-codeine (ROBITUSSIN AC) 100-10 MG/5ML syrup Take 5 mLs by mouth 4 (four) times daily as needed for cough. 118 mL 0  . ipratropium (ATROVENT) 0.06 % nasal spray Place 2 sprays into both nostrils 4 (four) times daily. 15 mL 1  . levothyroxine (SYNTHROID, LEVOTHROID) 25 MCG tablet TAKE 1 TABLET (25 MCG TOTAL) BY MOUTH DAILY. 90 tablet 3  . liothyronine (CYTOMEL) 25 MCG tablet TAKE 1/2 TABLET BY MOUTH EVERY DAY 45 tablet 3  . Multiple Vitamin (MULTIVITAMIN) tablet Take 1 tablet by mouth daily.    . ondansetron (ZOFRAN) 8 MG tablet Take 1 tablet (8 mg total) by mouth every 8 (eight) hours as needed for nausea or vomiting. 30 tablet 1  . venlafaxine XR (EFFEXOR-XR) 150 MG 24 hr capsule Take 1 capsule (150 mg total) by mouth daily with breakfast. 90 capsule 3   No current facility-administered medications on file prior to visit.    Allergies  Allergen Reactions  . Ciprofloxacin Itching and Other (See Comments)    Patient states she had muscle spasms  . Macrobid [Nitrofurantoin] Other (See Comments)    Significant transaminitis  . Dilaudid [Hydromorphone Hcl] Itching  . Gluten Meal Other (See Comments)      Review of Systems:  Constitutional: +recent illness  HEENT: +headache, no vision change  Cardiac: No  chest pain, No  pressure, No palpitations  Respiratory:  No  shortness of breath. +Cough  Gastrointestinal: No  abdominal pain, no change on bowel habits  Musculoskeletal: No new myalgia/arthralgia  Skin:  No  Rash  Exam:  BP 135/88   Pulse (!) 109   Temp (!) 97.5 F (36.4 C)   Ht 5' 5.5" (1.664 m)   Wt 165 lb 4.8 oz (75 kg)   LMP 09/11/2012   SpO2 97%   BMI 27.09 kg/m   Constitutional: VS see above. General Appearance: alert, well-developed, well-nourished, NAD  Eyes: Normal lids and conjunctive, non-icteric sclera  Ears, Nose, Mouth, Throat: MMM, Normal external inspection  ears/nares/mouth/lips/gums. TM normal bilaterally. Normal oropharynx and tonsils.  Neck: No masses, trachea midline. No enlarged lymph nodes.  Respiratory: Normal respiratory effort. no wheeze, no rhonchi, no rales  Cardiovascular: S1/S2 normal, no murmur, no rub/gallop auscultated. RRR.   Musculoskeletal: Gait normal. Symmetric and independent movement of all extremities  Neurological: Normal balance/coordination. No tremor.  Skin: warm, dry, intact.   Psychiatric: Normal judgment/insight. Normal mood and affect. Oriented x3.   Chest x-ray personally reviewed, no concerns. Dg Chest 2 View  Result Date: 04/18/2017 CLINICAL DATA:  Off and chest congestion for the past 2 weeks. History of breast malignancy, nonsmoker. EXAM: CHEST  2 VIEW COMPARISON:  PA and lateral chest x-ray of Nov 26, 2016 FINDINGS: The lungs are adequately inflated and clear. The heart and pulmonary vascularity are normal. The mediastinum is normal in width. There is calcification in the wall of the aortic arch. There is no pleural effusion. The bony thorax exhibits no acute abnormality. IMPRESSION: There is no acute cardiopulmonary abnormality. Thoracic aortic atherosclerosis. Electronically Signed   By: David  Martinique M.D.   On: 04/18/2017 08:15       ASSESSMENT/PLAN:   Exam are consistent with viral bronchitis, antibiotics only if needed, will trial steroids first.  Bronchitis  Cough - Plan: DG Chest 2 View, predniSONE (DELTASONE) 20 MG tablet, azithromycin (ZITHROMAX) 250 MG tablet    Patient Instructions  Can take 10 mL of the cough syrup before bed time if needed Can also use Benadryl at night  Can try steroids for inflammation  If no better or if worse by the weekend, can fill the antibiotics     Acute Bronchitis, Adult Acute bronchitis is sudden (acute) swelling of the air tubes (bronchi) in the lungs. Acute bronchitis causes these tubes to fill with mucus, which can make it hard to breathe. It can  also cause coughing or wheezing. In adults, acute bronchitis usually goes away within 2 weeks. A cough caused by bronchitis may last up to 3 weeks. Smoking, allergies, and asthma can make the condition worse. Repeated episodes of bronchitis may cause further lung problems, such as chronic obstructive pulmonary disease (COPD). What are the causes? This condition can be caused by germs and by substances that irritate the lungs, including:  Cold and flu viruses. This condition is most often caused by the same virus that causes a cold.  Bacteria.  Exposure to tobacco smoke, dust, fumes, and air pollution.  What increases the risk? This condition is more likely to develop in people who:  Have close contact with someone with acute bronchitis.  Are exposed to lung irritants, such as tobacco smoke, dust, fumes, and vapors.  Have a weak immune system.  Have a respiratory condition such as asthma.  What are the signs or symptoms? Symptoms of this condition include:  A cough.  Coughing up clear, yellow, or green mucus.  Wheezing.  Chest congestion.  Shortness of breath.  A fever.  Body aches.  Chills.  A sore throat.  How is this diagnosed? This  condition is usually diagnosed with a physical exam. During the exam, your health care provider may order tests, such as chest X-rays, to rule out other conditions. He or she may also:  Test a sample of your mucus for bacterial infection.  Check the level of oxygen in your blood. This is done to check for pneumonia.  Do a chest X-ray or lung function testing to rule out pneumonia and other conditions.  Perform blood tests.  Your health care provider will also ask about your symptoms and medical history. How is this treated? Most cases of acute bronchitis clear up over time without treatment. Your health care provider may recommend:  Drinking more fluids. Drinking more makes your mucus thinner, which may make it easier to  breathe.  Taking a medicine for a fever or cough.  Taking an antibiotic medicine.  Using an inhaler to help improve shortness of breath and to control a cough.  Using a cool mist vaporizer or humidifier to make it easier to breathe.  Follow these instructions at home: Medicines  Take over-the-counter and prescription medicines only as told by your health care provider.  If you were prescribed an antibiotic, take it as told by your health care provider. Do not stop taking the antibiotic even if you start to feel better. General instructions  Get plenty of rest.  Drink enough fluids to keep your urine clear or pale yellow.  Avoid smoking and secondhand smoke. Exposure to cigarette smoke or irritating chemicals will make bronchitis worse. If you smoke and you need help quitting, ask your health care provider. Quitting smoking will help your lungs heal faster.  Use an inhaler, cool mist vaporizer, or humidifier as told by your health care provider.  Keep all follow-up visits as told by your health care provider. This is important. How is this prevented? To lower your risk of getting this condition again:  Wash your hands often with soap and water. If soap and water are not available, use hand sanitizer.  Avoid contact with people who have cold symptoms.  Try not to touch your hands to your mouth, nose, or eyes.  Make sure to get the flu shot every year.  Contact a health care provider if:  Your symptoms do not improve in 2 weeks of treatment. Get help right away if:  You cough up blood.  You have chest pain.  You have severe shortness of breath.  You become dehydrated.  You faint or keep feeling like you are going to faint.  You keep vomiting.  You have a severe headache.  Your fever or chills gets worse. This information is not intended to replace advice given to you by your health care provider. Make sure you discuss any questions you have with your health care  provider. Document Released: 08/09/2004 Document Revised: 01/25/2016 Document Reviewed: 12/21/2015 Elsevier Interactive Patient Education  2017 [employer].      Follow-up plan: Return if symptoms worsen or fail to improve.  Visit summary with medication list and pertinent instructions was printed for patient to review, alert [place] if any changes needed. All questions at time of visit were answered - patient instructed to contact office with any additional concerns. ER/RTC precautions were reviewed with the patient and understanding verbalized.

## 2017-04-17 NOTE — Telephone Encounter (Signed)
Recommend office visit so I can also listen to lungs and review Xray with her.

## 2017-04-17 NOTE — Patient Instructions (Signed)
Can take 10 mL of the cough syrup before bed time if needed Can also use Benadryl at night  Can try steroids for inflammation  If no better or if worse by the weekend, can fill the antibiotics     Acute Bronchitis, Adult Acute bronchitis is sudden (acute) swelling of the air tubes (bronchi) in the lungs. Acute bronchitis causes these tubes to fill with mucus, which can make it hard to breathe. It can also cause coughing or wheezing. In adults, acute bronchitis usually goes away within 2 weeks. A cough caused by bronchitis may last up to 3 weeks. Smoking, allergies, and asthma can make the condition worse. Repeated episodes of bronchitis may cause further lung problems, such as chronic obstructive pulmonary disease (COPD). What are the causes? This condition can be caused by germs and by substances that irritate the lungs, including:  Cold and flu viruses. This condition is most often caused by the same virus that causes a cold.  Bacteria.  Exposure to tobacco smoke, dust, fumes, and air pollution.  What increases the risk? This condition is more likely to develop in people who:  Have close contact with someone with acute bronchitis.  Are exposed to lung irritants, such as tobacco smoke, dust, fumes, and vapors.  Have a weak immune system.  Have a respiratory condition such as asthma.  What are the signs or symptoms? Symptoms of this condition include:  A cough.  Coughing up clear, yellow, or green mucus.  Wheezing.  Chest congestion.  Shortness of breath.  A fever.  Body aches.  Chills.  A sore throat.  How is this diagnosed? This condition is usually diagnosed with a physical exam. During the exam, your health care provider may order tests, such as chest X-rays, to rule out other conditions. He or she may also:  Test a sample of your mucus for bacterial infection.  Check the level of oxygen in your blood. This is done to check for pneumonia.  Do a chest  X-ray or lung function testing to rule out pneumonia and other conditions.  Perform blood tests.  Your health care provider will also ask about your symptoms and medical history. How is this treated? Most cases of acute bronchitis clear up over time without treatment. Your health care provider may recommend:  Drinking more fluids. Drinking more makes your mucus thinner, which may make it easier to breathe.  Taking a medicine for a fever or cough.  Taking an antibiotic medicine.  Using an inhaler to help improve shortness of breath and to control a cough.  Using a cool mist vaporizer or humidifier to make it easier to breathe.  Follow these instructions at home: Medicines  Take over-the-counter and prescription medicines only as told by your health care provider.  If you were prescribed an antibiotic, take it as told by your health care provider. Do not stop taking the antibiotic even if you start to feel better. General instructions  Get plenty of rest.  Drink enough fluids to keep your urine clear or pale yellow.  Avoid smoking and secondhand smoke. Exposure to cigarette smoke or irritating chemicals will make bronchitis worse. If you smoke and you need help quitting, ask your health care provider. Quitting smoking will help your lungs heal faster.  Use an inhaler, cool mist vaporizer, or humidifier as told by your health care provider.  Keep all follow-up visits as told by your health care provider. This is important. How is this prevented? To lower your  risk of getting this condition again:  Wash your hands often with soap and water. If soap and water are not available, use hand sanitizer.  Avoid contact with people who have cold symptoms.  Try not to touch your hands to your mouth, nose, or eyes.  Make sure to get the flu shot every year.  Contact a health care provider if:  Your symptoms do not improve in 2 weeks of treatment. Get help right away if:  You cough  up blood.  You have chest pain.  You have severe shortness of breath.  You become dehydrated.  You faint or keep feeling like you are going to faint.  You keep vomiting.  You have a severe headache.  Your fever or chills gets worse. This information is not intended to replace advice given to you by your health care provider. Make sure you discuss any questions you have with your health care provider. Document Released: 08/09/2004 Document Revised: 01/25/2016 Document Reviewed: 12/21/2015 Elsevier Interactive Patient Education  2017 [employer].

## 2017-04-18 ENCOUNTER — Other Ambulatory Visit: Payer: Self-pay | Admitting: Osteopathic Medicine

## 2017-04-18 MED ORDER — LEVOTHYROXINE SODIUM 50 MCG PO TABS: 50.0000 ug | ORAL_TABLET | Freq: Every day | ORAL | 0 refills | Status: DC

## 2017-04-18 NOTE — Progress Notes (Signed)
Adjust thryoid meds

## 2017-05-10 ENCOUNTER — Ambulatory Visit (INDEPENDENT_AMBULATORY_CARE_PROVIDER_SITE_OTHER): Payer: BLUE CROSS/BLUE SHIELD | Admitting: Osteopathic Medicine

## 2017-05-10 ENCOUNTER — Encounter: Payer: Self-pay | Admitting: Osteopathic Medicine

## 2017-05-10 VITALS — BP 118/78 | HR 91 | Ht 65.5 in | Wt 164.0 lb

## 2017-05-10 DIAGNOSIS — E039 Hypothyroidism, unspecified: Secondary | ICD-10-CM | POA: Diagnosis not present

## 2017-05-10 DIAGNOSIS — Z23 Encounter for immunization: Secondary | ICD-10-CM

## 2017-05-10 DIAGNOSIS — Z8639 Personal history of other endocrine, nutritional and metabolic disease: Secondary | ICD-10-CM

## 2017-05-10 DIAGNOSIS — Z0000 Encounter for general adult medical examination without abnormal findings: Secondary | ICD-10-CM

## 2017-05-10 NOTE — Progress Notes (Signed)
HPI: Nancy Beard is a 61 y.o. female with PMH  has a past medical history of Breast cancer ([place]); Depression; DIVERTICULOSIS, COLON (05/05/2007); DVT (deep venous thrombosis) ([place]) (2014); DVT (deep venous thrombosis) ([place]) (06/30/2013); HEMORRHOIDS, EXTERNAL (05/05/2007); History of chemotherapy; Hypothyroidism; Neuropathy; Pneumonia; Sprue; Thyroid disease; Vitamin D insufficiency (06/20/2015); and Wears glasses.  who presents to [facility] today, 05/10/17,  for chief complaint of:  Chief Complaint  Patient presents with  . Annual Exam      Patient here for annual physical / wellness exam.  See preventive care reviewed as below.  Recent labs reviewed in detail with the patient.   Additional concerns today include:  None   Past medical, surgical, social and family history reviewed:  Patient Active Problem List   Diagnosis Date Noted  . Herpes simplex 04/12/2017  . Intraductal carcinoma in situ of breast 04/12/2017  . Swelling   . Hyperglycemia   . Transaminitis 06/27/2015  . Thrombocytosis ([place]) 06/27/2015  . Iron deficiency 06/24/2015  . Breast cancer ([place])   . Vitamin D insufficiency 06/20/2015  . Fracture of left tibial plateau 06/15/2015  . Fracture tibia/fibula 06/14/2015  . Genital herpes 09/06/2014  . Neuropathy 09/06/2014  . Anticoagulant long-term use 05/01/2014  . Persistent headaches 02/22/2014  . Hot flashes related to aromatase inhibitor therapy 12/10/2013  . Vulvar intraepithelial neoplasia III (VIN III) 06/30/2013  . Status post bilateral breast reconstruction 06/30/2013  . Osteoporosis 05/13/2013  . Acquired absence of bilateral breasts and nipples 09/23/2012  . Primary cancer of lower-inner quadrant of right female breast ([place]) 06/24/2012  . Hyperlipidemia 05/13/2008  . Hypothyroidism 01/31/2007  . Depression 01/31/2007  . Celiac disease/sprue 01/31/2007    Past Surgical History:  Procedure Laterality Date   . AXILLARY SENTINEL NODE BIOPSY Right 09/11/2012   Procedure: AXILLARY SENTINEL lymph NODE  BIOPSY;  Surgeon: Odis Hollingshead, MD;  Location: [place];  Service: General;  Laterality: Right;  right nuclear medicine injection 12:30   . BREAST LUMPECTOMY     benign lump  . BREAST RECONSTRUCTION Right 09/24/2013   Procedure: REVISION OF RIGHT BREAST RECONSTRUCTION WITH REPOSITIONING RIGHT IMPLANT, POSSIBLE EXCISION CAPSULAR CONTRACTURE AND LIPOFILLING FOR FAT GRAFTING;  Surgeon: Theodoro Kos, DO;  Location: [place];  Service: Plastics;  Laterality: Right;  . BREAST RECONSTRUCTION WITH PLACEMENT OF TISSUE EXPANDER AND FLEX HD (ACELLULAR HYDRATED DERMIS) Bilateral 09/11/2012   Procedure: BREAST RECONSTRUCTION WITH PLACEMENT OF TISSUE EXPANDER AND FLEX HD (ACELLULAR HYDRATED DERMIS) ADM;  Surgeon: Theodoro Kos, DO;  Location: [place];  Service: Plastics;  Laterality: Bilateral;  . COLONOSCOPY  2006  . cryoblation of cervix     long time ago  . ESOPHAGOGASTRODUODENOSCOPY  2007   sprue  . EXTERNAL FIXATION LEG Left 06/15/2015   Procedure: EXTERNAL FIXATION LEG;  Surgeon: Altamese Martin, MD;  Location: [place];  Service: Orthopedics;  Laterality: Left;  . INCISION AND DRAINAGE OF WOUND Left 09/16/2012   Procedure: Left Breast Evacuation of Hematoma;  Surgeon: Theodoro Kos, DO;  Location: [place];  Service: Plastics;  Laterality: Left;  . LIPOSUCTION WITH LIPOFILLING Bilateral 09/24/2013   Procedure: LIPOSUCTION WITH LIPOFILLING;  Surgeon: Theodoro Kos, DO;  Location: [place];  Service: Plastics;  Laterality: Bilateral;  Biltalteal filling breast  . ORIF TIBIA PLATEAU Left 06/21/2015   Procedure: OPEN REDUCTION INTERNAL FIXATION (ORIF) TIBIAL PLATEAU REMOVAL OF EXTERNAL FISTULA;  Surgeon: Altamese Munden, MD;  Location: [place];  Service: Orthopedics;  Laterality: Left;  . PORT-A-CATH REMOVAL Right 08/07/2013   Procedure: REMOVAL PORT-A-CATH;  Surgeon: Odis Hollingshead, MD;   Location: [place];  Service: General;  Laterality: Right;  . PORTACATH PLACEMENT Right 10/09/2012   Procedure: US GUIDED INSERTION PORT-A-CATH;  Surgeon: Odis Hollingshead, MD;  Location: [place];  Service: General;  Laterality: Right;  Right Subclavian Vein  . REMOVAL OF BILATERAL TISSUE EXPANDERS WITH PLACEMENT OF BILATERAL BREAST IMPLANTS Bilateral 06/24/2013   Procedure: REMOVAL OF BILATERAL TISSUE EXPANDERS WITH PLACEMENT OF BILATERAL BREAST IMPLANTS;  Surgeon: Theodoro Kos, DO;  Location: [place];  Service: Plastics;  Laterality: Bilateral;  . TOTAL MASTECTOMY Bilateral 09/11/2012   Procedure: bilateral MASTECTOMY;  Surgeon: Odis Hollingshead, MD;  Location: [place];  Service: General;  Laterality: Bilateral;  . VULVECTOMY N/A 07/14/2013   Procedure: WIDE LOCAL  EXCISION VULVAR;  Surgeon: Alvino Chapel, MD;  Location: [facility];  Service: Gynecology;  Laterality: N/A;    Social History  Substance Use Topics  . Smoking status: Never Smoker  . Smokeless tobacco: Never Used  . Alcohol use No    Family History  Problem Relation Age of Onset  . Breast cancer Paternal Aunt        dx in her 57s  . Lymphoma Paternal Uncle   . Breast cancer Paternal Grandmother        dx >50  . Heart attack Paternal Grandfather   . Breast cancer Other        2 maternal great aunts with breast cancer >50  . Osteoporosis Mother        controlled with diet/exercise  . COPD Father      Current medication list and allergy/intolerance information reviewed:    Current Outpatient Prescriptions  Medication Sig Dispense Refill  . alendronate (FOSAMAX) 70 MG tablet TAKE 1 TABLET BY MOUTH EVERY WEEK 12 tablet 3  . azithromycin (ZITHROMAX) 250 MG tablet 2 tabs po x1 on Day 1, then 1 tab po daily on Days 2 - 5 6 tablet 0  . benzonatate (TESSALON) 200 MG capsule Take 1 capsule (200 mg total) by mouth 3 (three) times daily as needed for cough. 20 capsule 0   . Cholecalciferol (VITAMIN D-3 PO) Take 1 tablet by mouth daily.     [name] guaiFENesin-codeine (ROBITUSSIN AC) 100-10 MG/5ML syrup Take 5 mLs by mouth 4 (four) times daily as needed for cough. 118 mL 0  . ipratropium (ATROVENT) 0.06 % nasal spray Place 2 sprays into both nostrils 4 (four) times daily. 15 mL 1  . levothyroxine (SYNTHROID, LEVOTHROID) 50 MCG tablet Take 1 tablet (50 mcg total) by mouth daily before breakfast. When bottle is running low, will be due to recheck labs 60 tablet 0  . liothyronine (CYTOMEL) 25 MCG tablet TAKE 1/2 TABLET BY MOUTH EVERY DAY 45 tablet 3  . Multiple Vitamin (MULTIVITAMIN) tablet Take 1 tablet by mouth daily.    . ondansetron (ZOFRAN) 8 MG tablet Take 1 tablet (8 mg total) by mouth every 8 (eight) hours as needed for nausea or vomiting. 30 tablet 1  . predniSONE (DELTASONE) 20 MG tablet Take 1 tablet (20 mg total) by mouth 2 (two) times daily with a meal. 10 tablet 0  . venlafaxine XR (EFFEXOR-XR) 150 MG 24 hr capsule Take 1 capsule (150 mg total) by mouth daily with breakfast. 90 capsule 3   No current facility-administered medications for this visit.     Allergies  Allergen  Reactions  . Ciprofloxacin Itching and Other (See Comments)    Patient states she had muscle spasms  . Macrobid [Nitrofurantoin] Other (See Comments)    Significant transaminitis  . Dilaudid [Hydromorphone Hcl] Itching  . Gluten Meal Other (See Comments)      Review of Systems:  Constitutional:  No  fever, no chills, No recent illness,  HEENT: No  headache, no vision change  Cardiac: No  chest pain, No  pressure, No palpitations  Respiratory:  No  shortness of breath. No  Cough  Gastrointestinal: No  abdominal pain, No  nausea, No  vomiting,  No  blood in stool, No  diarrhea, No  constipation   Musculoskeletal: No new myalgia/arthralgia  Genitourinary: No  incontinence, No  abnormal genital bleeding, No abnormal genital discharge  Skin: No  Rash, No other  wounds/concerning lesions  Endocrine: No cold intolerance,  No heat intolerance. No polyuria/polydipsia/polyphagia   Neurologic: No  weakness, No  dizziness, No  slurred speech/focal weakness/facial droop  Psychiatric: No  concerns with depression, No  concerns with anxiety, No sleep problems, No mood problems  Exam:  BP 118/78   Pulse 91   Ht 5' 5.5" (1.664 m)   Wt 164 lb (74.4 kg)   LMP 09/11/2012   BMI 26.88 kg/m   Constitutional: VS see above. General Appearance: alert, well-developed, well-nourished, NAD  Eyes: Normal lids and conjunctive, non-icteric sclera  Ears, Nose, Mouth, Throat: MMM, Normal external inspection ears/nares/mouth/lips/gums. TM normal bilaterally. Pharynx/tonsils no erythema, no exudate. Nasal mucosa normal.   Neck: No masses, trachea midline. No thyroid enlargement. No tenderness/mass appreciated. No lymphadenopathy  Respiratory: Normal respiratory effort. no wheeze, no rhonchi, no rales  Cardiovascular: S1/S2 normal, no murmur, no rub/gallop auscultated. RRR. No lower extremity edema.   Gastrointestinal: Nontender, no masses. No hepatomegaly, no splenomegaly. No hernia appreciated. Bowel sounds normal. Rectal exam deferred.   Musculoskeletal: Gait normal. No clubbing/cyanosis of digits.   Neurological: Normal balance/coordination. No tremor.   Skin: warm, dry, intact. No rash/ulcer.    Psychiatric: Normal judgment/insight. Normal mood and affect. Oriented x3.    ASSESSMENT/PLAN:   Annual physical exam  History of high cholesterol  Need for shingles vaccine - Plan: Varicella-zoster vaccine IM (Shingrix), CANCELED: Varicella-zoster vaccine IM (Shingrix)  Acquired hypothyroidism - Plan: TSH     FEMALE PREVENTIVE CARE Updated 05/10/17   ANNUAL SCREENING/COUNSELING  Diet/Exercise - HEALTHY HABITS DISCUSSED TO DECREASE CV RISK History  Smoking Status  . Never Smoker  Smokeless Tobacco  . Never Used   History  Alcohol Use No    Depression screen PHQ 2/9 04/12/2017  Decreased Interest 1  Down, Depressed, Hopeless 1  PHQ - 2 Score 2  Altered sleeping 1  Tired, decreased energy 1  Change in appetite 1  Feeling bad or failure about yourself  0  Trouble concentrating 0  Moving slowly or fidgety/restless 0  Suicidal thoughts -  PHQ-9 Score 5  Some recent data might be hidden    Domestic violence concerns - no  HTN SCREENING - SEE [place]  Sexually active in the past year - Yes with female.  Need/want STI testing today? - no  Concerns about libido or pain with sex? - no  Plans for pregnancy? - postmenopausal   INFECTIOUS DISEASE SCREENING  HIV - does not need  GC/CT - does not need  HepC - DOB [birth year] - does not need  TB - does not need  DISEASE SCREENING  Lipid - does  not need  DM2 - does not need  Osteoporosis - women age 66+ - needs  CANCER SCREENING  Cervical - does not need  Breast - does not need  Lung - does not need  Colon - needs -   ADULT VACCINATION  Influenza - annual vaccine recommended  Td - booster every 10 years   Zoster - Shingrix recommended 50+  PCV13 - was not indicated  PPSV23 - was not indicated Immunization History  Administered Date(s) Administered  . Influenza,inj,Quad PF,6+ Mos 04/12/2017  . Influenza-Unspecified 05/30/2014, 06/01/2015, 04/15/2016  . Tdap 09/06/2014  . Zoster Recombinat (Shingrix) 05/10/2017     Visit summary with medication list and pertinent instructions was printed for patient to review. All questions at time of visit were answered - patient instructed to contact office with any additional concerns. ER/RTC precautions were reviewed with the patient. Follow-up plan: Return in about 6 months (around 11/08/2017) for recheck prediabetes and 2nd shingles shot, sooner if needed .

## 2017-05-11 ENCOUNTER — Telehealth: Payer: Self-pay | Admitting: Osteopathic Medicine

## 2017-05-11 NOTE — Telephone Encounter (Signed)
Please call patient:  We discussed Cologuard for colon cancer screening, I believe [facility] does not always cover this test. Before we send the order, she needs to call her insurance company to see if CPT code is covered

## 2017-05-13 NOTE — Telephone Encounter (Signed)
CPT code is [phone]

## 2017-05-13 NOTE — Telephone Encounter (Signed)
Pt advised.

## 2017-05-14 NOTE — Telephone Encounter (Signed)
Pt returned clinic call and advised her insurance will cover the cologuard. Her call ref: [id number]. Order form completed and faxed.  Left VM updating Pt.

## 2017-05-21 ENCOUNTER — Other Ambulatory Visit: Payer: Self-pay | Admitting: Nurse Practitioner

## 2017-05-21 DIAGNOSIS — L738 Other specified follicular disorders: Secondary | ICD-10-CM | POA: Diagnosis not present

## 2017-05-21 DIAGNOSIS — L820 Inflamed seborrheic keratosis: Secondary | ICD-10-CM | POA: Diagnosis not present

## 2017-05-21 DIAGNOSIS — L720 Epidermal cyst: Secondary | ICD-10-CM | POA: Diagnosis not present

## 2017-05-21 DIAGNOSIS — L821 Other seborrheic keratosis: Secondary | ICD-10-CM | POA: Diagnosis not present

## 2017-06-04 DIAGNOSIS — L720 Epidermal cyst: Secondary | ICD-10-CM | POA: Diagnosis not present

## 2017-06-26 LAB — TSH: TSH: 3.01 mIU/L (ref 0.40–4.50)

## 2017-06-27 ENCOUNTER — Other Ambulatory Visit: Payer: Self-pay | Admitting: Osteopathic Medicine

## 2017-06-27 LAB — HEMOGLOBIN A1C
Hgb A1c MFr Bld: 6 % of total Hgb — ABNORMAL HIGH (ref <5.7)
Mean Plasma Glucose: 126 (calc)
eAG (mmol/L): 7 (calc)

## 2017-06-27 LAB — T4, FREE: Free T4: 0.9 ng/dL (ref 0.8–1.8)

## 2017-06-27 MED ORDER — LEVOTHYROXINE SODIUM 50 MCG PO TABS: 50.0000 ug | ORAL_TABLET | Freq: Every day | ORAL | 3 refills | Status: DC

## 2017-06-28 ENCOUNTER — Other Ambulatory Visit: Payer: Self-pay

## 2017-06-28 MED ORDER — LEVOTHYROXINE SODIUM 50 MCG PO TABS: 50.0000 ug | ORAL_TABLET | Freq: Every day | ORAL | 3 refills | Status: DC

## 2017-08-22 DIAGNOSIS — L910 Hypertrophic scar: Secondary | ICD-10-CM | POA: Diagnosis not present

## 2017-08-22 DIAGNOSIS — L720 Epidermal cyst: Secondary | ICD-10-CM | POA: Diagnosis not present

## 2017-08-28 ENCOUNTER — Other Ambulatory Visit: Payer: Self-pay | Admitting: Osteopathic Medicine

## 2017-08-28 NOTE — Telephone Encounter (Signed)
Pharmacy requesting refill for medication. Pls advise if appropriate. Thanks.

## 2017-09-25 DIAGNOSIS — M1732 Unilateral post-traumatic osteoarthritis, left knee: Secondary | ICD-10-CM | POA: Diagnosis not present

## 2017-09-25 DIAGNOSIS — M25562 Pain in left knee: Secondary | ICD-10-CM | POA: Diagnosis not present

## 2017-10-01 ENCOUNTER — Inpatient Hospital Stay: Payer: BLUE CROSS/BLUE SHIELD | Attending: Hematology and Oncology | Admitting: Adult Health

## 2017-10-01 ENCOUNTER — Telehealth: Payer: Self-pay | Admitting: Adult Health

## 2017-10-01 DIAGNOSIS — Z853 Personal history of malignant neoplasm of breast: Secondary | ICD-10-CM | POA: Diagnosis not present

## 2017-10-01 DIAGNOSIS — R740 Nonspecific elevation of levels of transaminase and lactic acid dehydrogenase [LDH]: Secondary | ICD-10-CM | POA: Diagnosis not present

## 2017-10-01 DIAGNOSIS — C50311 Malignant neoplasm of lower-inner quadrant of right female breast: Secondary | ICD-10-CM

## 2017-10-01 NOTE — Progress Notes (Signed)
[place] Follow up:    Nancy Reeve, DO [place] [address]   DIAGNOSIS: Cancer Staging Primary cancer of lower-inner quadrant of right female breast [facility]) Staging form: Breast, AJCC 7th Edition - Clinical: Stage IA (T1c, N0, cM0) - Unsigned Staging comments: Staged at breast conference 12.18.13  - Pathologic: No stage assigned - Unsigned   SUMMARY OF ONCOLOGIC HISTORY:   Primary cancer of lower-inner quadrant of right female breast ([place])   06/18/2012 Mammogram    screening mammogram in 06/2012 that showed a spiculated mass in the lower inner quadrant of the right breast. She had ultrasound performed that showed irregular mass at the 5:00 o'clock position 3 cm from the nipple measuring 1.1 cm.      06/19/2012 Initial Biopsy    invasive ductal carcinoma with DCIS, grade 3 tumor that was ER 2% PR negative HER-2/neu negative. Ki-67 was elevated at 34%      06/24/2012 Breast MRI    middle third of the lower inner quadrant of the right breast a 1.5 x 1.3 x 1.9 cm irregular enhancing mass.       06/24/2012 Initial Diagnosis    Cancer of lower-inner quadrant of female breast      09/11/2012 Surgery    Bilateral mastectomies      10/15/2012 - 03/03/2013 Chemotherapy    AC X 4 followed by Carbo-Taxol weekly X 12      03/30/2013 Procedure    DVT Right leg on Xarelto      05/19/2013 - 07/06/2015 Anti-estrogen oral therapy    Arimidex discontinued because of  left leg fracture (with a history of osteoporosis)      06/24/2015 Imaging    Left peroneal vein DVT when she developed left tibia and fibular fracture       CURRENT THERAPY: Observation  INTERVAL HISTORY: Nancy Beard 62 y.o. female returns for evlauation of her h/o breast cancer.  She continues on observation alone and is feeling well.  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  She recently moved to  [place] and is enjoying being closer to her 32 year old mother and son and his family.  She is doing well and has no s/s for recurrence.     Patient Active Problem List   Diagnosis Date Noted  . Herpes simplex 04/12/2017  . Swelling   . Hyperglycemia   . Transaminitis 06/27/2015  . Thrombocytosis ([place]) 06/27/2015  . Iron deficiency 06/24/2015  . Vitamin D insufficiency 06/20/2015  . Fracture of left tibial plateau 06/15/2015  . Fracture tibia/fibula 06/14/2015  . Genital herpes 09/06/2014  . Neuropathy 09/06/2014  . Anticoagulant long-term use 05/01/2014  . Persistent headaches 02/22/2014  . Hot flashes related to aromatase inhibitor therapy 12/10/2013  . Vulvar intraepithelial neoplasia III (VIN III) 06/30/2013  . Status post bilateral breast reconstruction 06/30/2013  . Osteoporosis 05/13/2013  . Acquired absence of bilateral breasts and nipples 09/23/2012  . Primary cancer of lower-inner quadrant of right female breast ([place]) 06/24/2012  . Hyperlipidemia 05/13/2008  . Hypothyroidism 01/31/2007  . Depression 01/31/2007  . Celiac disease/sprue 01/31/2007    is allergic to ciprofloxacin; macrobid [nitrofurantoin]; dilaudid [hydromorphone hcl]; and gluten meal.  MEDICAL HISTORY: Past Medical History:  Diagnosis Date  . Breast cancer ([place])    right  . Depression   . DIVERTICULOSIS, COLON 05/05/2007  . DVT (deep venous thrombosis) ([place]) 2014  Right lower extremity, thigh  . DVT (deep venous thrombosis) ([place]) 06/30/2013   August 2014. Continued Xarelto due to persistent chronic DVT R popliteal vein. Plan repeat per oncology March 2016   . HEMORRHOIDS, EXTERNAL 05/05/2007  . History of chemotherapy   . Hypothyroidism   . Neuropathy    of fingers and toes  . Pneumonia    hx of years ago  . Sprue   . Thyroid disease   . Vitamin D insufficiency 06/20/2015  . Wears glasses     SURGICAL HISTORY: Past Surgical History:  Procedure Laterality Date  . AXILLARY  SENTINEL NODE BIOPSY Right 09/11/2012   Procedure: AXILLARY SENTINEL lymph NODE  BIOPSY;  Surgeon: Odis Hollingshead, MD;  Location: [place];  Service: General;  Laterality: Right;  right nuclear medicine injection 12:30   . BREAST LUMPECTOMY     benign lump  . BREAST RECONSTRUCTION Right 09/24/2013   Procedure: REVISION OF RIGHT BREAST RECONSTRUCTION WITH REPOSITIONING RIGHT IMPLANT, POSSIBLE EXCISION CAPSULAR CONTRACTURE AND LIPOFILLING FOR FAT GRAFTING;  Surgeon: Theodoro Kos, DO;  Location: [place];  Service: Plastics;  Laterality: Right;  . BREAST RECONSTRUCTION WITH PLACEMENT OF TISSUE EXPANDER AND FLEX HD (ACELLULAR HYDRATED DERMIS) Bilateral 09/11/2012   Procedure: BREAST RECONSTRUCTION WITH PLACEMENT OF TISSUE EXPANDER AND FLEX HD (ACELLULAR HYDRATED DERMIS) ADM;  Surgeon: Theodoro Kos, DO;  Location: [place];  Service: Plastics;  Laterality: Bilateral;  . COLONOSCOPY  2006  . cryoblation of cervix     long time ago  . ESOPHAGOGASTRODUODENOSCOPY  2007   sprue  . EXTERNAL FIXATION LEG Left 06/15/2015   Procedure: EXTERNAL FIXATION LEG;  Surgeon: Altamese Evaro, MD;  Location: [place];  Service: Orthopedics;  Laterality: Left;  . INCISION AND DRAINAGE OF WOUND Left 09/16/2012   Procedure: Left Breast Evacuation of Hematoma;  Surgeon: Theodoro Kos, DO;  Location: [place];  Service: Plastics;  Laterality: Left;  . LIPOSUCTION WITH LIPOFILLING Bilateral 09/24/2013   Procedure: LIPOSUCTION WITH LIPOFILLING;  Surgeon: Theodoro Kos, DO;  Location: [place];  Service: Plastics;  Laterality: Bilateral;  Biltalteal filling breast  . ORIF TIBIA PLATEAU Left 06/21/2015   Procedure: OPEN REDUCTION INTERNAL FIXATION (ORIF) TIBIAL PLATEAU REMOVAL OF EXTERNAL FISTULA;  Surgeon: Altamese [place], MD;  Location: [place];  Service: Orthopedics;  Laterality: Left;  . PORT-A-CATH REMOVAL Right 08/07/2013   Procedure: REMOVAL PORT-A-CATH;  Surgeon: Odis Hollingshead, MD;  Location: [place];  Service: General;  Laterality: Right;  . PORTACATH PLACEMENT Right 10/09/2012   Procedure: US GUIDED INSERTION PORT-A-CATH;  Surgeon: Odis Hollingshead, MD;  Location: [place];  Service: General;  Laterality: Right;  Right Subclavian Vein  . REMOVAL OF BILATERAL TISSUE EXPANDERS WITH PLACEMENT OF BILATERAL BREAST IMPLANTS Bilateral 06/24/2013   Procedure: REMOVAL OF BILATERAL TISSUE EXPANDERS WITH PLACEMENT OF BILATERAL BREAST IMPLANTS;  Surgeon: Theodoro Kos, DO;  Location: [place];  Service: Plastics;  Laterality: Bilateral;  . TOTAL MASTECTOMY Bilateral 09/11/2012   Procedure: bilateral MASTECTOMY;  Surgeon: Odis Hollingshead, MD;  Location: [place];  Service: General;  Laterality: Bilateral;  . VULVECTOMY N/A 07/14/2013   Procedure: WIDE LOCAL  EXCISION VULVAR;  Surgeon: Alvino Chapel, MD;  Location: [facility];  Service: Gynecology;  Laterality: N/A;    SOCIAL HISTORY: Social History   Socioeconomic History  . Marital status: Married    Spouse name: Not on file  . Number of children: Not on file  .  Years of education: Not on file  . Highest education level: Not on file  Social Needs  . Financial resource strain: Not on file  . Food insecurity - worry: Not on file  . Food insecurity - inability: Not on file  . Transportation needs - medical: Not on file  . Transportation needs - non-medical: Not on file  Occupational History  . Not on file  Tobacco Use  . Smoking status: Never Smoker  . Smokeless tobacco: Never Used  Substance and Sexual Activity  . Alcohol use: No  . Drug use: No  . Sexual activity: Yes    Birth control/protection: Post-menopausal  Other Topics Concern  . Not on file  Social History Narrative   Married. 1 son- 1 grandson '43 lives in [place].       Works for [employer] full time.       Hobbies: reading/knitting/crochet/outside in fresh air    FAMILY HISTORY: Family History   Problem Relation Age of Onset  . Breast cancer Paternal Aunt        dx in her 74s  . Lymphoma Paternal Uncle   . Breast cancer Paternal Grandmother        dx >50  . Heart attack Paternal Grandfather   . Breast cancer Other        2 maternal great aunts with breast cancer >50  . Osteoporosis Mother        controlled with diet/exercise  . COPD Father     Review of Systems  Constitutional: Negative for appetite change, chills, fatigue, fever and unexpected weight change.  HENT:   Negative for hearing loss, lump/mass and trouble swallowing.   Eyes: Negative for eye problems and icterus.  Respiratory: Negative for chest tightness, cough and shortness of breath.   Cardiovascular: Negative for chest pain, leg swelling and palpitations.  Gastrointestinal: Negative for abdominal distention, abdominal pain, constipation, diarrhea, nausea and vomiting.  Endocrine: Negative for hot flashes.  Skin: Negative for itching and rash.  Neurological: Negative for dizziness, extremity weakness, headaches and numbness.  Hematological: Negative for adenopathy.  Psychiatric/Behavioral: Negative for depression. The patient is not nervous/anxious.       PHYSICAL EXAMINATION  ECOG PERFORMANCE STATUS: 0 - Asymptomatic  Vitals:   10/01/17 1446  BP: (!) 153/75  Pulse: 87  Resp: 18  Temp: 97.6 F (36.4 C)  SpO2: 97%    Physical Exam  Constitutional: She is oriented to person, place, and time and well-developed, well-nourished, and in no distress.  HENT:  Head: Normocephalic and atraumatic.  Mouth/Throat: Oropharynx is clear and moist. No oropharyngeal exudate.  Eyes: Pupils are equal, round, and reactive to light. No scleral icterus.  Neck: Neck supple.  Cardiovascular: Normal rate, regular rhythm and normal heart sounds.  Pulmonary/Chest: Effort normal and breath sounds normal. No respiratory distress. She has no wheezes. She has no rales.  Bilateral breast s/p mastectomy without nodules,  masses or sign of recurrence  Abdominal: Soft. Bowel sounds are normal. She exhibits no distension. There is no tenderness.  Musculoskeletal: She exhibits no edema.  Lymphadenopathy:    She has no cervical adenopathy.  Neurological: She is alert and oriented to person, place, and time.  Skin: Skin is warm and dry. No rash noted.  Psychiatric: Mood and affect normal.    LABORATORY DATA:  CBC    Component Value Date/Time   WBC 7.3 04/12/2017 1109   RBC 5.14 (H) 04/12/2017 1109   HGB 13.8 04/12/2017 1109  HGB 13.8 10/04/2015 1558   HCT 41.8 04/12/2017 1109   HCT 40.6 10/04/2015 1558   PLT 303 04/12/2017 1109   PLT 244 10/04/2015 1558   MCV 81.3 04/12/2017 1109   MCV 85.1 10/04/2015 1558   MCH 26.8 (L) 04/12/2017 1109   MCHC 33.0 04/12/2017 1109   RDW 13.3 04/12/2017 1109   RDW 14.9 (H) 10/04/2015 1558   LYMPHSABS 2.8 10/04/2015 1558   MONOABS 0.8 10/04/2015 1558   EOSABS 0.2 10/04/2015 1558   BASOSABS 0.0 10/04/2015 1558    CMP     Component Value Date/Time   NA 139 04/12/2017 1109   NA 138 10/04/2015 1558   K 4.6 04/12/2017 1109   K 3.9 10/04/2015 1558   CL 106 04/12/2017 1109   CL 105 01/06/2013 0827   CO2 21 04/12/2017 1109   CO2 25 10/04/2015 1558   GLUCOSE 102 (H) 04/12/2017 1109   GLUCOSE 117 10/04/2015 1558   GLUCOSE 97 01/06/2013 0827   BUN 14 04/12/2017 1109   BUN 11.3 10/04/2015 1558   CREATININE 0.61 04/12/2017 1109   CREATININE 0.7 10/04/2015 1558   CALCIUM 9.1 04/12/2017 1109   CALCIUM 9.2 10/04/2015 1558   PROT 7.1 04/12/2017 1109   PROT 7.4 10/04/2015 1558   ALBUMIN 4.0 10/04/2015 1558   AST 29 04/12/2017 1109   AST 36 (H) 10/04/2015 1558   ALT 31 (H) 04/12/2017 1109   ALT 54 10/04/2015 1558   ALKPHOS 194 (H) 10/04/2015 1558   BILITOT 0.5 04/12/2017 1109   BILITOT 0.41 10/04/2015 1558   GFRNONAA 97 04/12/2017 1109   GFRAA 113 04/12/2017 1109     ASSESSMENT and PLAN:   Primary cancer of lower-inner quadrant of right female  breast Right breast invasive ductal carcinoma with DCIS, grade 3, ER 2%, PR 0%, HER-2 negative, Ki-67 34%, status post bilateral mastectomies and reconstruction, adjuvant chemotherapy with before meals 4 followed by [name] Taxol 12, antiestrogen therapy with Arimidex started 05/19/2013 eventhough she has only 2% ER positivity stopped 07/06/2015 (due to osteoporotic fracture tibia and due to 2% ER positivity)  DVT right leg: U/S Leg 12/15/14: Chronic deep vein thrombosis noted in the right popliteal vein. Appeared unchanged from studies on 09/28/14, 03/23/14, and 11/10/13 Hospitalization12/9/16 to 07/01/15 (left tibia fracture), Left peroneal DVT  Recurrent DVTof left peroneal vein from recent fracture: Has completed Xarelto. Apparently this should be billed to worker's compensation [facility] [phone] or office No [phone] or email [email or web address])  Leg cramps: improved, she has tapered off and discontinued both gabapentin. Elevated transaminases: AST and ALT had improved significantly. Patient gets blood work to her primary care physician regularly Iron deficiency anemia: Resolved  [name] is doing well and has no sign of recurrence.  We reviewe dher health maintenance and that she should continue with healthy diet, and exercise 30 minutes per day most days of the week.   RTC one year for follow-up with survivorship clinic.     All questions were answered. The patient knows to call the clinic with any problems, questions or concerns. We can certainly see the patient much sooner if necessary.  A total of (30) minutes of face-to-face time was spent with this patient with greater than 50% of that time in counseling and care-coordination.  This note was electronically signed. Scot Dock, NP 10/02/2017

## 2017-10-01 NOTE — Assessment & Plan Note (Addendum)
Right breast invasive ductal carcinoma with DCIS, grade 3, ER 2%, PR 0%, HER-2 negative, Ki-67 34%, status post bilateral mastectomies and reconstruction, adjuvant chemotherapy with before meals 4 followed by [name] Taxol 12, antiestrogen therapy with Arimidex started 05/19/2013 eventhough she has only 2% ER positivity stopped 07/06/2015 (due to osteoporotic fracture tibia and due to 2% ER positivity)  DVT right leg: U/S Leg 12/15/14: Chronic deep vein thrombosis noted in the right popliteal vein. Appeared unchanged from studies on 09/28/14, 03/23/14, and 11/10/13 Hospitalization12/9/16 to 07/01/15 (left tibia fracture), Left peroneal DVT  Recurrent DVTof left peroneal vein from recent fracture: Has completed Xarelto. Apparently this should be billed to worker's compensation Nancy Beard [phone] or office No [phone] or email [email or web address])  Leg cramps: improved, she has tapered off and discontinued both gabapentin. Elevated transaminases: AST and ALT had improved significantly. Patient gets blood work to her primary care physician regularly Iron deficiency anemia: Resolved  [name] is doing well and has no sign of recurrence.  We reviewe dher health maintenance and that she should continue with healthy diet, and exercise 30 minutes per day most days of the week.   RTC one year for follow-up with survivorship clinic.

## 2017-10-01 NOTE — Telephone Encounter (Signed)
Gave avs and calendar ° °

## 2017-10-02 ENCOUNTER — Encounter: Payer: Self-pay | Admitting: Adult Health

## 2017-10-02 ENCOUNTER — Other Ambulatory Visit: Payer: Self-pay | Admitting: Osteopathic Medicine

## 2017-11-03 ENCOUNTER — Other Ambulatory Visit: Payer: Self-pay | Admitting: Osteopathic Medicine

## 2017-11-08 ENCOUNTER — Ambulatory Visit: Payer: BLUE CROSS/BLUE SHIELD | Admitting: Osteopathic Medicine

## 2017-11-08 ENCOUNTER — Encounter: Payer: Self-pay | Admitting: Osteopathic Medicine

## 2017-11-08 VITALS — BP 137/88 | HR 83 | Temp 98.2°F | Wt 172.9 lb

## 2017-11-08 DIAGNOSIS — E611 Iron deficiency: Secondary | ICD-10-CM

## 2017-11-08 DIAGNOSIS — R030 Elevated blood-pressure reading, without diagnosis of hypertension: Secondary | ICD-10-CM | POA: Diagnosis not present

## 2017-11-08 DIAGNOSIS — E559 Vitamin D deficiency, unspecified: Secondary | ICD-10-CM | POA: Diagnosis not present

## 2017-11-08 DIAGNOSIS — Z23 Encounter for immunization: Secondary | ICD-10-CM

## 2017-11-08 DIAGNOSIS — R7303 Prediabetes: Secondary | ICD-10-CM

## 2017-11-08 LAB — CBC
HCT: 42.1 % (ref 35.0–45.0)
Hemoglobin: 14.1 g/dL (ref 11.7–15.5)
MCH: 28.1 pg (ref 27.0–33.0)
MCHC: 33.5 g/dL (ref 32.0–36.0)
MCV: 83.9 fL (ref 80.0–100.0)
MPV: 11.5 fL (ref 7.5–12.5)
Platelets: 280 10*3/uL (ref 140–400)
RBC: 5.02 10*6/uL (ref 3.80–5.10)
RDW: 13.7 % (ref 11.0–15.0)
WBC: 6 10*3/uL (ref 3.8–10.8)

## 2017-11-08 LAB — LIPID PANEL
Cholesterol: 226 mg/dL — ABNORMAL HIGH (ref <200)
HDL: 46 mg/dL — ABNORMAL LOW (ref >50)
LDL Cholesterol (Calc): 151 mg/dL (calc) — ABNORMAL HIGH
Non-HDL Cholesterol (Calc): 180 mg/dL (calc) — ABNORMAL HIGH (ref <130)
Total CHOL/HDL Ratio: 4.9 (calc) (ref <5.0)
Triglycerides: 158 mg/dL — ABNORMAL HIGH (ref <150)

## 2017-11-08 LAB — POCT GLYCOSYLATED HEMOGLOBIN (HGB A1C): Hemoglobin A1C: 6

## 2017-11-08 MED ORDER — VENLAFAXINE HCL ER 150 MG PO CP24: 150.0000 mg | ORAL_CAPSULE | Freq: Every day | ORAL | 3 refills | Status: DC

## 2017-11-08 NOTE — Progress Notes (Signed)
HPI: Nancy Beard is a 62 y.o. female who  has a past medical history of Breast cancer ([place]), Depression, DIVERTICULOSIS, COLON (05/05/2007), DVT (deep venous thrombosis) ([place]) (2014), DVT (deep venous thrombosis) ([place]) (06/30/2013), HEMORRHOIDS, EXTERNAL (05/05/2007), History of chemotherapy, Hypothyroidism, Neuropathy, Pneumonia, Sprue, Thyroid disease, Vitamin D insufficiency (06/20/2015), and Wears glasses.  she presents to [facility] today, 11/08/17,  for chief complaint of:  Follow-up Prediabetes 2nd Shingles  Pre-DM 03/2017 was 5.6% Last A1C 06/2017 was 6.0% Today 11/08/17 was 6.0% Doing okay with diet/exercise.  There is actually a program at her church that goes through diabetes care/diet and preventive measures.  Elevated BP on intake, she was rushing to get here. Recheck prior to leaving the office was improved. No CP/SOB, no HA/VC.   Due for 2nd shingles vaccine 6 month dose Immunization History  Administered Date(s) Administered  . Influenza,inj,Quad PF,6+ Mos 04/12/2017  . Influenza-Unspecified 05/30/2014, 06/01/2015, 04/15/2016  . Tdap 09/06/2014  . Zoster Recombinat (Shingrix) 05/10/2017      Past medical history, surgical history, and family history reviewed.  Current medication list and allergy/intolerance information reviewed.   (See remainder of HPI, as well as ROS, PE below)    Results for orders placed or performed in visit on 11/08/17 (from the past 72 hour(s))  POCT HgB A1C     Status: None   Collection Time: 11/08/17  9:04 AM  Result Value Ref Range   Hemoglobin A1C 6.0      ASSESSMENT/PLAN: The primary encounter diagnosis was Prediabetes. Diagnoses of Elevated blood pressure reading, Need for shingles vaccine, Vitamin D deficiency, and Low iron were also pertinent to this visit.   Chronic conditions are stable.,  No concerns.  Refills of chronic medications as below  Discussed lifestyle modifications for  prediabetes.  Patient would like to hold off on medications for now, this is reasonable as long as A1c continues to stay below diabetic range   Meds ordered this encounter  Medications  . venlafaxine XR (EFFEXOR-XR) 150 MG 24 hr capsule    Sig: Take 1 capsule (150 mg total) by mouth daily with breakfast.    Dispense:  90 capsule    Refill:  3    Follow-up plan: Return in about 6 months (around 05/10/2018) for [place], sooner if needed .      ^^^^^^^^^^^^^^^^^^^^^^^^^^^^^^^ ^^^^^^^^^^^^^^^^^^^^^^^^^^^^^^^ ^^^^^^^^^^^^^^^^^^^^^^^^^^^^^^^  Outpatient Encounter Medications as of 11/08/2017  Medication Sig  . alendronate (FOSAMAX) 70 MG tablet TAKE 1 TABLET BY MOUTH EVERY WEEK  . Cholecalciferol (VITAMIN D-3 PO) Take 1 tablet by mouth daily.   [name] levothyroxine (SYNTHROID, LEVOTHROID) 50 MCG tablet Take 1 tablet (50 mcg total) by mouth daily before breakfast. When bottle is running low, will be due to recheck labs  . liothyronine (CYTOMEL) 25 MCG tablet TAKE 1/2 TABLET BY MOUTH EVERY DAY  . Multiple Vitamin (MULTIVITAMIN) tablet Take 1 tablet by mouth daily.  . ondansetron (ZOFRAN) 8 MG tablet Take 1 tablet (8 mg total) by mouth every 8 (eight) hours as needed for nausea or vomiting.  . venlafaxine XR (EFFEXOR-XR) 150 MG 24 hr capsule Take 1 capsule (150 mg total) by mouth daily with breakfast. Pt must keep appt on 11/08/17 for refills   No facility-administered encounter medications on file as of 11/08/2017.    Allergies  Allergen Reactions  . Ciprofloxacin Itching and Other (See Comments)    Patient states she had muscle spasms  . Macrobid [Nitrofurantoin] Other (See Comments)  Significant transaminitis  . Dilaudid [Hydromorphone Hcl] Itching  . Gluten Meal Other (See Comments)      Review of Systems:  Constitutional: No recent illness  HEENT: No  headache, no vision change  Cardiac: No  chest pain, No  pressure, No palpitations  Respiratory:  No  shortness  of breath. No  Cough  Gastrointestinal: No  abdominal pain  Musculoskeletal: No new myalgia/arthralgia  Psychiatric: No  concerns with depression, No  concerns with anxiety  Exam:  BP 137/88 (BP Location: Left Arm, Patient Position: Sitting, Cuff Size: Normal)   Pulse 83   Temp 98.2 F (36.8 C) (Oral)   Wt 172 lb 14.4 oz (78.4 kg)   LMP 09/11/2012   BMI 28.33 kg/m   Constitutional: VS see above. General Appearance: alert, well-developed, well-nourished, NAD  Eyes: Normal lids and conjunctive, non-icteric sclera  Ears, Nose, Mouth, Throat: MMM, Normal external inspection ears/nares/mouth/lips/gums.  Neck: No masses, trachea midline.   Respiratory: Normal respiratory effort. no wheeze, no rhonchi, no rales  Cardiovascular: S1/S2 normal, no murmur, no rub/gallop auscultated. RRR.   Musculoskeletal: Gait normal. Symmetric and independent movement of all extremities  Neurological: Normal balance/coordination. No tremor.  Skin: warm, dry, intact.   Psychiatric: Normal judgment/insight. Normal mood and affect. Oriented x3.   Visit summary with medication list and pertinent instructions was printed for patient to review, alert [place] if any changes needed. All questions at time of visit were answered - patient instructed to contact office with any additional concerns. ER/RTC precautions were reviewed with the patient and understanding verbalized.   Follow-up plan: Return in about 6 months (around 05/10/2018) for [place], sooner if needed .    Please note: voice recognition software was used to produce this document, and typos may escape review. Please contact Dr. Sheppard Coil for any needed clarifications.

## 2017-11-09 LAB — IRON,TIBC AND FERRITIN PANEL
%SAT: 20 % (calc) (ref 11–50)
Ferritin: 16 ng/mL — ABNORMAL LOW (ref 20–288)
Iron: 84 ug/dL (ref 45–160)
TIBC: 413 mcg/dL (calc) (ref 250–450)

## 2017-11-09 LAB — VITAMIN D 25 HYDROXY (VIT D DEFICIENCY, FRACTURES): Vit D, 25-Hydroxy: 29 ng/mL — ABNORMAL LOW (ref 30–100)

## 2017-12-11 DIAGNOSIS — Z124 Encounter for screening for malignant neoplasm of cervix: Secondary | ICD-10-CM | POA: Diagnosis not present

## 2017-12-11 DIAGNOSIS — Z01419 Encounter for gynecological examination (general) (routine) without abnormal findings: Secondary | ICD-10-CM | POA: Diagnosis not present

## 2017-12-11 DIAGNOSIS — Z6828 Body mass index (BMI) 28.0-28.9, adult: Secondary | ICD-10-CM | POA: Diagnosis not present

## 2018-03-07 ENCOUNTER — Emergency Department (HOSPITAL_BASED_OUTPATIENT_CLINIC_OR_DEPARTMENT_OTHER)
Admission: EM | Admit: 2018-03-07 | Discharge: 2018-03-07 | Disposition: A | Discharge status: Home / Self Care | Payer: BLUE CROSS/BLUE SHIELD | Attending: Emergency Medicine | Admitting: Emergency Medicine

## 2018-03-07 ENCOUNTER — Emergency Department (HOSPITAL_BASED_OUTPATIENT_CLINIC_OR_DEPARTMENT_OTHER): Payer: BLUE CROSS/BLUE SHIELD

## 2018-03-07 ENCOUNTER — Encounter (HOSPITAL_BASED_OUTPATIENT_CLINIC_OR_DEPARTMENT_OTHER): Payer: Self-pay | Admitting: *Deleted

## 2018-03-07 ENCOUNTER — Other Ambulatory Visit: Payer: Self-pay

## 2018-03-07 DIAGNOSIS — Z853 Personal history of malignant neoplasm of breast: Secondary | ICD-10-CM | POA: Insufficient documentation

## 2018-03-07 DIAGNOSIS — Z79899 Other long term (current) drug therapy: Secondary | ICD-10-CM | POA: Diagnosis not present

## 2018-03-07 DIAGNOSIS — R2242 Localized swelling, mass and lump, left lower limb: Secondary | ICD-10-CM | POA: Diagnosis not present

## 2018-03-07 DIAGNOSIS — M7989 Other specified soft tissue disorders: Secondary | ICD-10-CM | POA: Diagnosis not present

## 2018-03-07 DIAGNOSIS — R2241 Localized swelling, mass and lump, right lower limb: Secondary | ICD-10-CM | POA: Diagnosis not present

## 2018-03-07 DIAGNOSIS — E039 Hypothyroidism, unspecified: Secondary | ICD-10-CM | POA: Diagnosis not present

## 2018-03-07 NOTE — ED Notes (Signed)
ED Provider at bedside. 

## 2018-03-07 NOTE — Discharge Instructions (Signed)
Continue to elevate and try using a compression stocking

## 2018-03-07 NOTE — ED Triage Notes (Signed)
Left leg is red and swollen x 2 days. No pain. Hx DVT.

## 2018-03-07 NOTE — ED Provider Notes (Signed)
[facility] EMERGENCY DEPARTMENT Provider Note   CSN: [id number] Arrival date & time: 03/07/18  1619     History   Chief Complaint Chief Complaint  Patient presents with  . Leg Swelling    HPI Nancy Beard is a 62 y.o. female.  Patient is a 62 year old female with a history of right lower extremity DVT, prior breast cancer, hypothyroidism who is presenting today with 6 days of intermittent swelling in her left lower extremity.  Patient states last Sunday she mowed the yard and got a severe sunburn on both of her legs.  She has had no pain in her leg but over the last 6 days had noticed some swelling in the leg.  It typically does get better after elevating it but she is never had this before.  She has had no leg pain but some mild tingling.  The DVT she had in the past was in her right lower extremity but she has been off Xarelto for several years.  She is not currently undergoing any active chemotherapy and denies any chest pain or shortness of breath.  She denies any recent surgeries or immobility as well.  The history is provided by the patient.    Past Medical History:  Diagnosis Date  . Breast cancer ([place])    right  . Depression   . DIVERTICULOSIS, COLON 05/05/2007  . DVT (deep venous thrombosis) ([place]) 2014   Right lower extremity, thigh  . DVT (deep venous thrombosis) ([place]) 06/30/2013   August 2014. Continued Xarelto due to persistent chronic DVT R popliteal vein. Plan repeat per oncology March 2016   . HEMORRHOIDS, EXTERNAL 05/05/2007  . History of chemotherapy   . Hypothyroidism   . Neuropathy    of fingers and toes  . Pneumonia    hx of years ago  . Sprue   . Thyroid disease   . Vitamin D insufficiency 06/20/2015  . Wears glasses     Patient Active Problem List   Diagnosis Date Noted  . Herpes simplex 04/12/2017  . Swelling   . Hyperglycemia   . Transaminitis 06/27/2015  . Thrombocytosis ([place]) 06/27/2015  . Iron deficiency 06/24/2015  .  Vitamin D insufficiency 06/20/2015  . Fracture of left tibial plateau 06/15/2015  . Fracture tibia/fibula 06/14/2015  . Genital herpes 09/06/2014  . Neuropathy 09/06/2014  . Anticoagulant long-term use 05/01/2014  . Persistent headaches 02/22/2014  . Hot flashes related to aromatase inhibitor therapy 12/10/2013  . Vulvar intraepithelial neoplasia III (VIN III) 06/30/2013  . Status post bilateral breast reconstruction 06/30/2013  . Osteoporosis 05/13/2013  . Acquired absence of bilateral breasts and nipples 09/23/2012  . Primary cancer of lower-inner quadrant of right female breast ([place]) 06/24/2012  . Hyperlipidemia 05/13/2008  . Hypothyroidism 01/31/2007  . Depression 01/31/2007  . Celiac disease/sprue 01/31/2007    Past Surgical History:  Procedure Laterality Date  . AXILLARY SENTINEL NODE BIOPSY Right 09/11/2012   Procedure: AXILLARY SENTINEL lymph NODE  BIOPSY;  Surgeon: Odis Hollingshead, MD;  Location: [facility];  Service: General;  Laterality: Right;  right nuclear medicine injection 12:30   . BREAST LUMPECTOMY     benign lump  . BREAST RECONSTRUCTION Right 09/24/2013   Procedure: REVISION OF RIGHT BREAST RECONSTRUCTION WITH REPOSITIONING RIGHT IMPLANT, POSSIBLE EXCISION CAPSULAR CONTRACTURE AND LIPOFILLING FOR FAT GRAFTING;  Surgeon: Theodoro Kos, DO;  Location: [facility];  Service: Plastics;  Laterality: Right;  . BREAST RECONSTRUCTION WITH PLACEMENT OF TISSUE EXPANDER AND FLEX HD (  ACELLULAR HYDRATED DERMIS) Bilateral 09/11/2012   Procedure: BREAST RECONSTRUCTION WITH PLACEMENT OF TISSUE EXPANDER AND FLEX HD (ACELLULAR HYDRATED DERMIS) ADM;  Surgeon: Theodoro Kos, DO;  Location: [facility];  Service: Plastics;  Laterality: Bilateral;  . COLONOSCOPY  2006  . cryoblation of cervix     long time ago  . ESOPHAGOGASTRODUODENOSCOPY  2007   sprue  . EXTERNAL FIXATION LEG Left 06/15/2015   Procedure: EXTERNAL FIXATION LEG;  Surgeon: Altamese Wheeler, MD;  Location: [facility];   Service: Orthopedics;  Laterality: Left;  . INCISION AND DRAINAGE OF WOUND Left 09/16/2012   Procedure: Left Breast Evacuation of Hematoma;  Surgeon: Theodoro Kos, DO;  Location: [facility];  Service: Plastics;  Laterality: Left;  . LIPOSUCTION WITH LIPOFILLING Bilateral 09/24/2013   Procedure: LIPOSUCTION WITH LIPOFILLING;  Surgeon: Theodoro Kos, DO;  Location: [facility];  Service: Plastics;  Laterality: Bilateral;  Biltalteal filling breast  . ORIF TIBIA PLATEAU Left 06/21/2015   Procedure: OPEN REDUCTION INTERNAL FIXATION (ORIF) TIBIAL PLATEAU REMOVAL OF EXTERNAL FISTULA;  Surgeon: Altamese Upland, MD;  Location: [facility];  Service: Orthopedics;  Laterality: Left;  . PORT-A-CATH REMOVAL Right 08/07/2013   Procedure: REMOVAL PORT-A-CATH;  Surgeon: Odis Hollingshead, MD;  Location: [facility];  Service: General;  Laterality: Right;  . PORTACATH PLACEMENT Right 10/09/2012   Procedure: US GUIDED INSERTION PORT-A-CATH;  Surgeon: Odis Hollingshead, MD;  Location: [facility];  Service: General;  Laterality: Right;  Right Subclavian Vein  . REMOVAL OF BILATERAL TISSUE EXPANDERS WITH PLACEMENT OF BILATERAL BREAST IMPLANTS Bilateral 06/24/2013   Procedure: REMOVAL OF BILATERAL TISSUE EXPANDERS WITH PLACEMENT OF BILATERAL BREAST IMPLANTS;  Surgeon: Theodoro Kos, DO;  Location: [facility];  Service: Plastics;  Laterality: Bilateral;  . TOTAL MASTECTOMY Bilateral 09/11/2012   Procedure: bilateral MASTECTOMY;  Surgeon: Odis Hollingshead, MD;  Location: [facility];  Service: General;  Laterality: Bilateral;  . VULVECTOMY N/A 07/14/2013   Procedure: WIDE LOCAL  EXCISION VULVAR;  Surgeon: Alvino Chapel, MD;  Location: [facility];  Service: Gynecology;  Laterality: N/A;     OB History   None      Home Medications    Prior to Admission medications   Medication Sig Start Date End Date Taking? Authorizing Provider  alendronate (FOSAMAX) 70 MG tablet TAKE 1  TABLET BY MOUTH EVERY WEEK 12/17/16  Yes Nicholas Lose, MD  Cholecalciferol (VITAMIN D-3 PO) Take 1 tablet by mouth daily.    Yes [provider]  levothyroxine (SYNTHROID, LEVOTHROID) 50 MCG tablet Take 1 tablet (50 mcg total) by mouth daily before breakfast. When bottle is running low, will be due to recheck labs 06/28/17  Yes Emeterio Reeve, DO  liothyronine (CYTOMEL) 25 MCG tablet TAKE 1/2 TABLET BY MOUTH EVERY DAY 08/29/17  Yes Emeterio Reeve, DO  Multiple Vitamin (MULTIVITAMIN) tablet Take 1 tablet by mouth daily.   Yes [provider]  ondansetron (ZOFRAN) 8 MG tablet Take 1 tablet (8 mg total) by mouth every 8 (eight) hours as needed for nausea or vomiting. 04/12/17  Yes Emeterio Reeve, DO  venlafaxine XR (EFFEXOR-XR) 150 MG 24 hr capsule Take 1 capsule (150 mg total) by mouth daily with breakfast. 11/08/17  Yes Emeterio Reeve, DO    Family History Family History  Problem Relation Age of Onset  . Breast cancer Paternal Aunt        dx in her 23s  . Lymphoma Paternal Uncle   . Breast cancer Paternal Grandmother  dx >50  . Heart attack Paternal Grandfather   . Breast cancer Other        2 maternal great aunts with breast cancer >50  . Osteoporosis Mother        controlled with diet/exercise  . COPD Father     Social History Social History   Tobacco Use  . Smoking status: Never Smoker  . Smokeless tobacco: Never Used  Substance Use Topics  . Alcohol use: No  . Drug use: No     Allergies   Ciprofloxacin; Macrobid [nitrofurantoin]; Dilaudid [hydromorphone hcl]; and Gluten meal   Review of Systems Review of Systems  All other systems reviewed and are negative.    Physical Exam Updated Vital Signs BP (!) 152/93   Pulse (!) 102   Temp 98.4 F (36.9 C) (Oral)   Resp 16   Ht 5\' 6"  (1.676 m)   Wt 78 kg   LMP 09/11/2012   SpO2 96%   BMI 27.76 kg/m   Physical Exam  Constitutional: She appears well-developed and well-nourished.  No distress.  HENT:  Head: Normocephalic and atraumatic.  Eyes: Pupils are equal, round, and reactive to light.  Cardiovascular: Intact distal pulses. Tachycardia present.  Pulmonary/Chest: Effort normal.  Musculoskeletal: She exhibits no tenderness.  No calf or medial thigh tenderness.  Mild swelling noted to the left calf and ankle area.  2+ left DP pulse  Skin: Skin is warm and dry.  Patient has sunburn present over bilateral lower extremities and her chest no blisters, open skin or drainage  Psychiatric: She has a normal mood and affect. Her behavior is normal.  Nursing note and vitals reviewed.    ED Treatments / Results  Labs (all labs ordered are listed, but only abnormal results are displayed) Labs Reviewed - No data to display  EKG None  Radiology US Venous Img Lower Unilateral Left  Result Date: 03/07/2018 CLINICAL DATA:  Left lower extremity swelling for several days after sun burn. History of lower extremity DVT. History of breast cancer. EXAM: LEFT LOWER EXTREMITY VENOUS DOPPLER ULTRASOUND TECHNIQUE: Gray-scale sonography with graded compression, as well as color Doppler and duplex ultrasound were performed to evaluate the lower extremity deep venous systems from the level of the common femoral vein and including the common femoral, femoral, profunda femoral, popliteal and calf veins including the posterior tibial, peroneal and gastrocnemius veins when visible. The superficial great saphenous vein was also interrogated. Spectral Doppler was utilized to evaluate flow at rest and with distal augmentation maneuvers in the common femoral, femoral and popliteal veins. COMPARISON:  01/09/2016 lower extremity venous Doppler scan. FINDINGS: Contralateral Common Femoral Vein: Respiratory phasicity is normal and symmetric with the symptomatic side. No evidence of thrombus. Normal compressibility. Common Femoral Vein: No evidence of thrombus. Normal compressibility, respiratory phasicity  and response to augmentation. Saphenofemoral Junction: No evidence of thrombus. Normal compressibility and flow on color Doppler imaging. Profunda Femoral Vein: No evidence of thrombus. Normal compressibility and flow on color Doppler imaging. Femoral Vein: No evidence of thrombus. Normal compressibility, respiratory phasicity and response to augmentation. Popliteal Vein: No evidence of thrombus. Normal compressibility, respiratory phasicity and response to augmentation. Calf Veins: No evidence of thrombus. Normal compressibility and flow on color Doppler imaging. Superficial Great Saphenous Vein: No evidence of thrombus. Normal compressibility. Venous Reflux:  None. Other Findings:  None. IMPRESSION: No evidence of deep venous thrombosis in the left lower extremity. Electronically Signed   By: Ilona Sorrel M.D.   On: 03/07/2018 20:21  Procedures Procedures (including critical care time)  Medications Ordered in ED Medications - No data to display   Initial Impression / Assessment and Plan / ED Course  I have reviewed the triage vital signs and the nursing notes.  Pertinent labs & imaging results that were available during my care of the patient were reviewed by me and considered in my medical decision making (see chart for details).     Patient presenting today with 6 days of persistent left leg swelling after getting a severe sunburn from mowing the yard.  Patient does have history of DVT but is been off anticoagulation for several years.  Patient denies any leg pain and has no chest pain or shortness of breath.  However given symptoms and new swelling in the left lower extremity we will do an ultrasound to rule out DVT.  Could be some lymphedema related to the severe sunburn but no evidence of cellulitis at this time.  Low suspicion for CHF or cardiac related cause for her leg swelling.  8:43 PM [place] neg for DVT.  Will d/c pt home with supportive care  Final Clinical Impressions(s) / ED  Diagnoses   Final diagnoses:  Left leg swelling    ED Discharge Orders    None       Blanchie Dessert, MD 03/07/18 2043

## 2018-05-12 ENCOUNTER — Encounter: Payer: Self-pay | Admitting: Osteopathic Medicine

## 2018-05-14 ENCOUNTER — Ambulatory Visit (INDEPENDENT_AMBULATORY_CARE_PROVIDER_SITE_OTHER): Payer: BLUE CROSS/BLUE SHIELD | Admitting: Osteopathic Medicine

## 2018-05-14 ENCOUNTER — Encounter: Payer: Self-pay | Admitting: Osteopathic Medicine

## 2018-05-14 VITALS — BP 130/81 | HR 85 | Temp 98.0°F | Wt 172.0 lb

## 2018-05-14 DIAGNOSIS — R7303 Prediabetes: Secondary | ICD-10-CM | POA: Diagnosis not present

## 2018-05-14 DIAGNOSIS — E559 Vitamin D deficiency, unspecified: Secondary | ICD-10-CM

## 2018-05-14 DIAGNOSIS — Z23 Encounter for immunization: Secondary | ICD-10-CM | POA: Diagnosis not present

## 2018-05-14 DIAGNOSIS — E039 Hypothyroidism, unspecified: Secondary | ICD-10-CM

## 2018-05-14 DIAGNOSIS — Z0000 Encounter for general adult medical examination without abnormal findings: Secondary | ICD-10-CM

## 2018-05-14 DIAGNOSIS — E611 Iron deficiency: Secondary | ICD-10-CM

## 2018-05-14 DIAGNOSIS — Z8639 Personal history of other endocrine, nutritional and metabolic disease: Secondary | ICD-10-CM | POA: Diagnosis not present

## 2018-05-14 DIAGNOSIS — M810 Age-related osteoporosis without current pathological fracture: Secondary | ICD-10-CM

## 2018-05-14 DIAGNOSIS — C50311 Malignant neoplasm of lower-inner quadrant of right female breast: Secondary | ICD-10-CM

## 2018-05-14 LAB — POCT GLYCOSYLATED HEMOGLOBIN (HGB A1C): Hemoglobin A1C: 5.8 % — AB (ref 4.0–5.6)

## 2018-05-14 MED ORDER — LEVOTHYROXINE SODIUM 125 MCG PO TABS: 125.0000 ug | ORAL_TABLET | Freq: Every day | ORAL | 0 refills | Status: DC

## 2018-05-14 MED ORDER — ALENDRONATE SODIUM 70 MG PO TABS: 70.0000 mg | ORAL_TABLET | ORAL | 3 refills | Status: DC

## 2018-05-14 NOTE — Patient Instructions (Signed)
General Preventive Care  Most recent routine screening lipids/other labs: ordered today, will get in 6 weeks when we recheck the thyroid. Cholesterol and Diabetes screening usually recommended annually.   Everyone should have blood pressure checked once per year.   Tobacco: don't! Alcohol: responsible moderation is ok for most adults - if you have concerns about your alcohol intake, please talk to me! Recreational/Illicit Drugs: don't!  Exercise: as tolerated to reduce risk of cardiovascular disease and diabetes. Strength training will also prevent osteoporosis.   Mental health: if need for mental health care (medicines, counseling, other), or concerns about moods, please let me know!   Sexual health: if need for STD testing, or if concerns with libido/pain problems, please let me know! Vaccines  Flu vaccine: recommended for almost everyone, every fall (by Halloween! Flu is scary!), especially if you are pregnant, if you have exposure to the public, if you're around young children or the elderly, or if you're around pregnant people.   Shingles vaccine: Shingrix recommended after age 16   Pneumonia vaccines: Prevnar and Pneumovax recommended after age 38, sooner if diabetes, COPD/asthma, others  Tetanus booster: Tdap recommended every 10 years Cancer screenings   Colon cancer screening: complete and return the Cologuard!   Breast cancer screening: mammogram recommended at age 28 every other year at least, and annually after age 41.   Cervical cancer screening: every 1 to 5 years depending on age and other risk factors. Can stop at age 12 or w/ hysterectomy as long as previous testing was normal.  Infection screenings . HIV: recommended screening at least once age 68-65, more often if risk factors  . Gonorrhea/Chlamydia: screening as needed, though many insurances require testing for anyone on birth control pills . Hepatitis C: recommended for anyone born 38-1966 . TB: certain  at-risk populations, or depending on work requirements and/or travel history Other . Bone Density Test: recommended for women at age 5, men at age 37, sooner depending on risk factors . Advanced Directive: Living Will and/or Healthcare Power of Attorney recommended for all adults, regardless of age or health!

## 2018-05-14 NOTE — Progress Notes (Signed)
HPI: Nancy Beard is a 62 y.o. female who  has a past medical history of Breast cancer ([place]), Depression, DIVERTICULOSIS, COLON (05/05/2007), DVT (deep venous thrombosis) ([place]) (2014), DVT (deep venous thrombosis) ([place]) (06/30/2013), HEMORRHOIDS, EXTERNAL (05/05/2007), History of chemotherapy, Hypothyroidism, Neuropathy, Pneumonia, Sprue, Thyroid disease, Vitamin D insufficiency (06/20/2015), and Wears glasses.  she presents to [facility] today, 05/14/18,  for chief complaint of: Annual Physical    Patient here for annual physical / wellness exam.  See preventive care reviewed as below.  Recent labs reviewed in detail with the patient.   Additional concerns today include:  Would like to simplify thyroid Rx regimen to just one thing     Past medical, surgical, social and family history reviewed:  Patient Active Problem List   Diagnosis Date Noted  . Herpes simplex 04/12/2017  . Swelling   . Hyperglycemia   . Transaminitis 06/27/2015  . Thrombocytosis ([place]) 06/27/2015  . Iron deficiency 06/24/2015  . Vitamin D insufficiency 06/20/2015  . Fracture of left tibial plateau 06/15/2015  . Fracture tibia/fibula 06/14/2015  . Genital herpes 09/06/2014  . Neuropathy 09/06/2014  . Anticoagulant long-term use 05/01/2014  . Persistent headaches 02/22/2014  . Hot flashes related to aromatase inhibitor therapy 12/10/2013  . Vulvar intraepithelial neoplasia III (VIN III) 06/30/2013  . Status post bilateral breast reconstruction 06/30/2013  . Osteoporosis 05/13/2013  . Acquired absence of bilateral breasts and nipples 09/23/2012  . Primary cancer of lower-inner quadrant of right female breast ([place]) 06/24/2012  . Hyperlipidemia 05/13/2008  . Hypothyroidism 01/31/2007  . Depression 01/31/2007  . Celiac disease/sprue 01/31/2007    Past Surgical History:  Procedure Laterality Date  . AXILLARY SENTINEL NODE BIOPSY Right 09/11/2012   Procedure:  AXILLARY SENTINEL lymph NODE  BIOPSY;  Surgeon: Odis Hollingshead, MD;  Location: [place];  Service: General;  Laterality: Right;  right nuclear medicine injection 12:30   . BREAST LUMPECTOMY     benign lump  . BREAST RECONSTRUCTION Right 09/24/2013   Procedure: REVISION OF RIGHT BREAST RECONSTRUCTION WITH REPOSITIONING RIGHT IMPLANT, POSSIBLE EXCISION CAPSULAR CONTRACTURE AND LIPOFILLING FOR FAT GRAFTING;  Surgeon: Theodoro Kos, DO;  Location: [place];  Service: Plastics;  Laterality: Right;  . BREAST RECONSTRUCTION WITH PLACEMENT OF TISSUE EXPANDER AND FLEX HD (ACELLULAR HYDRATED DERMIS) Bilateral 09/11/2012   Procedure: BREAST RECONSTRUCTION WITH PLACEMENT OF TISSUE EXPANDER AND FLEX HD (ACELLULAR HYDRATED DERMIS) ADM;  Surgeon: Theodoro Kos, DO;  Location: [employer];  Service: Plastics;  Laterality: Bilateral;  . COLONOSCOPY  2006  . cryoblation of cervix     long time ago  . ESOPHAGOGASTRODUODENOSCOPY  2007   sprue  . EXTERNAL FIXATION LEG Left 06/15/2015   Procedure: EXTERNAL FIXATION LEG;  Surgeon: Altamese Oxford, MD;  Location: [place];  Service: Orthopedics;  Laterality: Left;  . INCISION AND DRAINAGE OF WOUND Left 09/16/2012   Procedure: Left Breast Evacuation of Hematoma;  Surgeon: Theodoro Kos, DO;  Location: [place];  Service: Plastics;  Laterality: Left;  . LIPOSUCTION WITH LIPOFILLING Bilateral 09/24/2013   Procedure: LIPOSUCTION WITH LIPOFILLING;  Surgeon: Theodoro Kos, DO;  Location: [place];  Service: Plastics;  Laterality: Bilateral;  Biltalteal filling breast  . ORIF TIBIA PLATEAU Left 06/21/2015   Procedure: OPEN REDUCTION INTERNAL FIXATION (ORIF) TIBIAL PLATEAU REMOVAL OF EXTERNAL FISTULA;  Surgeon: Altamese , MD;  Location: [place];  Service: Orthopedics;  Laterality: Left;  . PORT-A-CATH REMOVAL Right 08/07/2013   Procedure: REMOVAL PORT-A-CATH;  Surgeon:  Odis Hollingshead, MD;  Location: [place];  Service: General;  Laterality:  Right;  . PORTACATH PLACEMENT Right 10/09/2012   Procedure: US GUIDED INSERTION PORT-A-CATH;  Surgeon: Odis Hollingshead, MD;  Location: [place];  Service: General;  Laterality: Right;  Right Subclavian Vein  . REMOVAL OF BILATERAL TISSUE EXPANDERS WITH PLACEMENT OF BILATERAL BREAST IMPLANTS Bilateral 06/24/2013   Procedure: REMOVAL OF BILATERAL TISSUE EXPANDERS WITH PLACEMENT OF BILATERAL BREAST IMPLANTS;  Surgeon: Theodoro Kos, DO;  Location: [place];  Service: Plastics;  Laterality: Bilateral;  . TOTAL MASTECTOMY Bilateral 09/11/2012   Procedure: bilateral MASTECTOMY;  Surgeon: Odis Hollingshead, MD;  Location: [place];  Service: General;  Laterality: Bilateral;  . VULVECTOMY N/A 07/14/2013   Procedure: WIDE LOCAL  EXCISION VULVAR;  Surgeon: Alvino Chapel, MD;  Location: [facility];  Service: Gynecology;  Laterality: N/A;    Social History   Tobacco Use  . Smoking status: Never Smoker  . Smokeless tobacco: Never Used  Substance Use Topics  . Alcohol use: No    Family History  Problem Relation Age of Onset  . Breast cancer Paternal Aunt        dx in her 59s  . Lymphoma Paternal Uncle   . Breast cancer Paternal Grandmother        dx >50  . Heart attack Paternal Grandfather   . Breast cancer Other        2 maternal great aunts with breast cancer >50  . Osteoporosis Mother        controlled with diet/exercise  . COPD Father      Current medication list and allergy/intolerance information reviewed:    Current Outpatient Medications  Medication Sig Dispense Refill  . alendronate (FOSAMAX) 70 MG tablet TAKE 1 TABLET BY MOUTH EVERY WEEK 12 tablet 3  . Cholecalciferol (VITAMIN D-3 PO) Take 1 tablet by mouth daily.     [name] levothyroxine (SYNTHROID, LEVOTHROID) 50 MCG tablet Take 1 tablet (50 mcg total) by mouth daily before breakfast. When bottle is running low, will be due to recheck labs 90 tablet 3  . liothyronine (CYTOMEL) 25 MCG tablet TAKE  1/2 TABLET BY MOUTH EVERY DAY 45 tablet 3  . Multiple Vitamin (MULTIVITAMIN) tablet Take 1 tablet by mouth daily.    . ondansetron (ZOFRAN) 8 MG tablet Take 1 tablet (8 mg total) by mouth every 8 (eight) hours as needed for nausea or vomiting. 30 tablet 1  . venlafaxine XR (EFFEXOR-XR) 150 MG 24 hr capsule Take 1 capsule (150 mg total) by mouth daily with breakfast. 90 capsule 3   No current facility-administered medications for this visit.     Allergies  Allergen Reactions  . Ciprofloxacin Itching and Other (See Comments)    Patient states she had muscle spasms  . Macrobid [Nitrofurantoin] Other (See Comments)    Significant transaminitis  . Dilaudid [Hydromorphone Hcl] Itching  . Gluten Meal Other (See Comments)      Review of Systems:  Constitutional:  No  fever, no chills, No recent illness, No unintentional weight changes. No significant fatigue.   HEENT: No  headache, no vision change, no hearing change, No sore throat, No  sinus pressure  Cardiac: No  chest pain, No  pressure, No palpitations, No  Orthopnea  Respiratory:  No  shortness of breath. No  Cough  Gastrointestinal: No  abdominal pain, No  nausea, No  vomiting,  No  blood in stool, No  diarrhea,  No  constipation   Musculoskeletal: No new myalgia/arthralgia  Skin: No  Rash, No other wounds/concerning lesions  Genitourinary: No  incontinence, No  abnormal genital bleeding, No abnormal genital discharge  Hem/Onc: No  easy bruising/bleeding, No  abnormal lymph node  Endocrine: No cold intolerance,  No heat intolerance. No polyuria/polydipsia/polyphagia   Neurologic: No  weakness, No  dizziness, No  slurred speech/focal weakness/facial droop  Psychiatric: No  concerns with depression, No  concerns with anxiety, No sleep problems, No mood problems  Exam:  BP 130/81   Pulse 85   Temp 98 F (36.7 C) (Oral)   Wt 172 lb (78 kg)   LMP 09/11/2012   BMI 27.76 kg/m   Constitutional: VS see above. General  Appearance: alert, well-developed, well-nourished, NAD  Eyes: Normal lids and conjunctive, non-icteric sclera  Ears, Nose, Mouth, Throat: MMM, Normal external inspection ears/nares/mouth/lips/gums. TM normal bilaterally. Pharynx/tonsils no erythema, no exudate. Nasal mucosa normal.   Neck: No masses, trachea midline. No thyroid enlargement. No tenderness/mass appreciated. No lymphadenopathy  Respiratory: Normal respiratory effort. no wheeze, no rhonchi, no rales  Cardiovascular: S1/S2 normal, no murmur, no rub/gallop auscultated. RRR. No lower extremity edema  Gastrointestinal: Nontender, no masses. No hepatomegaly, no splenomegaly. No hernia appreciated. Bowel sounds normal. Rectal exam deferred.   Musculoskeletal: Gait normal. No clubbing/cyanosis of digits.   Neurological: Normal balance/coordination. No tremor. No cranial nerve deficit on limited exam. Motor and sensation intact and symmetric. Cerebellar reflexes intact.   Skin: warm, dry, intact. No rash/ulcer. No concerning nevi or subq nodules on limited exam.    Psychiatric: Normal judgment/insight. Normal mood and affect. Oriented x3.      ASSESSMENT/PLAN: The primary encounter diagnosis was Annual physical exam. Diagnoses of Prediabetes, Vitamin D deficiency, History of high cholesterol, Acquired hypothyroidism, Osteoporosis without current pathological fracture, unspecified osteoporosis type, Iron deficiency, Primary cancer of lower-inner quadrant of right female breast ([place]), and Need for influenza vaccination were also pertinent to this visit.   Patient Instructions  General Preventive Care  Most recent routine screening lipids/other labs: ordered today, will get in 6 weeks when we recheck the thyroid. Cholesterol and Diabetes screening usually recommended annually.   Everyone should have blood pressure checked once per year.   Tobacco: don't! Alcohol: responsible moderation is ok for most adults - if you have concerns  about your alcohol intake, please talk to me! Recreational/Illicit Drugs: don't!  Exercise: as tolerated to reduce risk of cardiovascular disease and diabetes. Strength training will also prevent osteoporosis.   Mental health: if need for mental health care (medicines, counseling, other), or concerns about moods, please let me know!   Sexual health: if need for STD testing, or if concerns with libido/pain problems, please let me know! Vaccines  Flu vaccine: recommended for almost everyone, every fall (by Halloween! Flu is scary!), especially if you are pregnant, if you have exposure to the public, if you're around young children or the elderly, or if you're around pregnant people.   Shingles vaccine: Shingrix recommended after age 31   Pneumonia vaccines: Prevnar and Pneumovax recommended after age 55, sooner if diabetes, COPD/asthma, others  Tetanus booster: Tdap recommended every 10 years Cancer screenings   Colon cancer screening: complete and return the Cologuard!   Breast cancer screening: mammogram recommended at age 54 every other year at least, and annually after age 64.   Cervical cancer screening: every 1 to 5 years depending on age and other risk factors. Can stop at age 92 or w/  hysterectomy as long as previous testing was normal.  Infection screenings . HIV: recommended screening at least once age 4-65, more often if risk factors  . Gonorrhea/Chlamydia: screening as needed, though many insurances require testing for anyone on birth control pills . Hepatitis C: recommended for anyone born [birth year]-1965 . TB: certain at-risk populations, or depending on work requirements and/or travel history Other . Bone Density Test: recommended for women at age 63, men at age 76, sooner depending on risk factors . Advanced Directive: Living Will and/or Healthcare Power of Attorney recommended for all adults, regardless of age or health!  Immunization History  Administered Date(s)  Administered  . Influenza,inj,Quad PF,6+ Mos 04/12/2017, 05/14/2018  . Influenza-Unspecified 05/30/2014, 06/01/2015, 04/15/2016  . Tdap 09/06/2014  . Zoster Recombinat (Shingrix) 05/10/2017, 11/08/2017     Visit summary with medication list and pertinent instructions was printed for patient to review. All questions at time of visit were answered - patient instructed to contact office with any additional concerns. ER/RTC precautions were reviewed with the patient.   Follow-up plan: Return in about 6 weeks (around 06/25/2018) for recheck A1C prediabetes .     Please note: voice recognition software was used to produce this document, and typos may escape review. Please contact Dr. Sheppard Coil for any needed clarifications.

## 2018-07-13 DIAGNOSIS — J101 Influenza due to other identified influenza virus with other respiratory manifestations: Secondary | ICD-10-CM | POA: Diagnosis not present

## 2018-08-03 ENCOUNTER — Other Ambulatory Visit: Payer: Self-pay | Admitting: Osteopathic Medicine

## 2018-08-04 NOTE — Telephone Encounter (Signed)
[facility] pharmacy requesting med refill for levothyroxine. Per provider's last lab note, pt was due for recheck on thyroid levels. Call pt, aware of lab order & that refill for med is pending. Pt will complete labs sometime this week. No other inquiries during call.

## 2018-08-08 ENCOUNTER — Other Ambulatory Visit: Payer: Self-pay | Admitting: Osteopathic Medicine

## 2018-08-08 DIAGNOSIS — R748 Abnormal levels of other serum enzymes: Secondary | ICD-10-CM

## 2018-08-08 LAB — CBC
HCT: 42.8 % (ref 35.0–45.0)
Hemoglobin: 14.4 g/dL (ref 11.7–15.5)
MCH: 29 pg (ref 27.0–33.0)
MCHC: 33.6 g/dL (ref 32.0–36.0)
MCV: 86.1 fL (ref 80.0–100.0)
MPV: 11.9 fL (ref 7.5–12.5)
Platelets: 265 10*3/uL (ref 140–400)
RBC: 4.97 10*6/uL (ref 3.80–5.10)
RDW: 13.8 % (ref 11.0–15.0)
WBC: 6.1 10*3/uL (ref 3.8–10.8)

## 2018-08-08 LAB — COMPLETE METABOLIC PANEL WITH GFR
AG Ratio: 1.6 (calc) (ref 1.0–2.5)
ALT: 183 U/L — ABNORMAL HIGH (ref 6–29)
AST: 133 U/L — ABNORMAL HIGH (ref 10–35)
Albumin: 4 g/dL (ref 3.6–5.1)
Alkaline phosphatase (APISO): 183 U/L — ABNORMAL HIGH (ref 33–130)
BUN: 11 mg/dL (ref 7–25)
CO2: 27 mmol/L (ref 20–32)
Calcium: 8.8 mg/dL (ref 8.6–10.4)
Chloride: 105 mmol/L (ref 98–110)
Creat: 0.7 mg/dL (ref 0.50–0.99)
GFR, Est African American: 107 mL/min/{1.73_m2} (ref >=60)
GFR, Est Non African American: 92 mL/min/{1.73_m2} (ref >=60)
Globulin: 2.5 g/dL (calc) (ref 1.9–3.7)
Glucose, Bld: 115 mg/dL — ABNORMAL HIGH (ref 65–99)
Potassium: 4.5 mmol/L (ref 3.5–5.3)
Sodium: 140 mmol/L (ref 135–146)
Total Bilirubin: 0.3 mg/dL (ref 0.2–1.2)
Total Protein: 6.5 g/dL (ref 6.1–8.1)

## 2018-08-08 LAB — IRON,TIBC AND FERRITIN PANEL
%SAT: 13 % (calc) — ABNORMAL LOW (ref 16–45)
Ferritin: 35 ng/mL (ref 16–288)
Iron: 47 ug/dL (ref 45–160)
TIBC: 365 mcg/dL (calc) (ref 250–450)

## 2018-08-08 LAB — LIPID PANEL
Cholesterol: 221 mg/dL — ABNORMAL HIGH (ref <200)
HDL: 51 mg/dL (ref >50)
LDL Cholesterol (Calc): 137 mg/dL (calc) — ABNORMAL HIGH
Non-HDL Cholesterol (Calc): 170 mg/dL (calc) — ABNORMAL HIGH (ref <130)
Total CHOL/HDL Ratio: 4.3 (calc) (ref <5.0)
Triglycerides: 192 mg/dL — ABNORMAL HIGH (ref <150)

## 2018-08-08 LAB — TSH: TSH: 10.27 mIU/L — ABNORMAL HIGH (ref 0.40–4.50)

## 2018-08-08 LAB — VITAMIN D 25 HYDROXY (VIT D DEFICIENCY, FRACTURES): Vit D, 25-Hydroxy: 27 ng/mL — ABNORMAL LOW (ref 30–100)

## 2018-08-08 LAB — TEST AUTHORIZATION

## 2018-08-08 LAB — HEMOGLOBIN A1C W/OUT EAG: Hgb A1c MFr Bld: 5.9 % of total Hgb — ABNORMAL HIGH (ref <5.7)

## 2018-08-13 LAB — CP4508-PT/INR AND PTT
INR: 0.9
Prothrombin Time: 9.4 s (ref 9.0–11.5)
aPTT: 24 s (ref 22–34)

## 2018-08-15 NOTE — Addendum Note (Signed)
Addended by: Maryla Morrow on: 08/15/2018 03:05 PM   Modules accepted: Orders

## 2018-08-19 LAB — HEPATITIS C ANTIBODY
Hepatitis C Ab: NONREACTIVE
SIGNAL TO CUT-OFF: 0.01 (ref <1.00)

## 2018-08-19 LAB — HEPATITIS B SURFACE ANTIGEN: Hepatitis B Surface Ag: NONREACTIVE

## 2018-08-19 LAB — GAMMA GT: GGT: 174 U/L — ABNORMAL HIGH (ref 3–65)

## 2018-08-19 NOTE — Progress Notes (Signed)
Pt aware of labs and number given to patient to call and get ultrasound scheduled.

## 2018-08-25 ENCOUNTER — Ambulatory Visit (INDEPENDENT_AMBULATORY_CARE_PROVIDER_SITE_OTHER): Payer: BLUE CROSS/BLUE SHIELD

## 2018-08-25 DIAGNOSIS — Z853 Personal history of malignant neoplasm of breast: Secondary | ICD-10-CM

## 2018-08-25 DIAGNOSIS — R748 Abnormal levels of other serum enzymes: Secondary | ICD-10-CM | POA: Diagnosis not present

## 2018-08-25 DIAGNOSIS — R945 Abnormal results of liver function studies: Secondary | ICD-10-CM | POA: Diagnosis not present

## 2018-08-27 ENCOUNTER — Other Ambulatory Visit: Payer: Self-pay

## 2018-08-27 ENCOUNTER — Encounter: Payer: Self-pay | Admitting: Osteopathic Medicine

## 2018-08-27 ENCOUNTER — Ambulatory Visit: Payer: BLUE CROSS/BLUE SHIELD | Admitting: Osteopathic Medicine

## 2018-08-27 VITALS — BP 127/84 | HR 88 | Temp 98.0°F | Ht 66.0 in | Wt 174.7 lb

## 2018-08-27 DIAGNOSIS — R748 Abnormal levels of other serum enzymes: Secondary | ICD-10-CM | POA: Diagnosis not present

## 2018-08-27 DIAGNOSIS — R7303 Prediabetes: Secondary | ICD-10-CM

## 2018-08-27 DIAGNOSIS — E039 Hypothyroidism, unspecified: Secondary | ICD-10-CM | POA: Diagnosis not present

## 2018-08-27 DIAGNOSIS — G44209 Tension-type headache, unspecified, not intractable: Secondary | ICD-10-CM | POA: Diagnosis not present

## 2018-08-27 MED ORDER — ONDANSETRON HCL 8 MG PO TABS: 8.0000 mg | ORAL_TABLET | Freq: Three times a day (TID) | ORAL | 1 refills | Status: DC | PRN

## 2018-08-27 NOTE — Patient Instructions (Addendum)
Will double check thyroid Rx dose   Should be on 137 mcg  If not, let me know  Last TSH shows dose needed increased   Will recheck liver enzyme labs today  If high - to GI specialist   If lower - will watch these

## 2018-08-27 NOTE — Progress Notes (Signed)
HPI: Nancy Beard is a 63 y.o. female who  has a past medical history of Breast cancer ([place]), Depression, DIVERTICULOSIS, COLON (05/05/2007), DVT (deep venous thrombosis) ([place]) (2014), DVT (deep venous thrombosis) ([place]) (06/30/2013), HEMORRHOIDS, EXTERNAL (05/05/2007), History of chemotherapy, Hypothyroidism, Neuropathy, Pneumonia, Sprue, Thyroid disease, Vitamin D insufficiency (06/20/2015), and Wears glasses.  she presents to [facility] today, 08/27/18,  for chief complaint of:  Review labs -elevated liver enzymes, TSH   Markedly elevated liver enzymes on labs in January, done late as part of the annual physical.  Additional work-up revealed no concern for elevated iron, hepatitis infection.  Abdominal ultrasound showed fatty liver.  Patient is prediabetic, she is careful about what she eats and is strict with healthy diet with low carbs and low fats.  There is no alcohol intake.  Ports diagnosis with flulike illness around this time, also reports some headaches should have responded well to Aleve, she is not taking Tylenol.  TSH was also significantly elevated, greater than 10.  Patient is not positive that she is taking the increased dose of the levothyroxine.  Reports headache at base of skull/behind ears, responds really well to [place], has not been taking any Tylenol for this.  Hunches a lot over a computer screen at work, thinks this might have something to do with it.  No vision changes     At today's visit 08/27/18 ... PMH, PSH, FH reviewed and updated as needed.  Current medication list and allergy/intolerance hx reviewed and updated as needed. (See remainder of HPI, ROS, Phys Exam below)   US Abdomen Limited Ruq  Result Date: 08/25/2018 CLINICAL DATA:  Elevated liver enzymes.  History of breast carcinoma EXAM: ULTRASOUND ABDOMEN LIMITED RIGHT UPPER QUADRANT COMPARISON:  May 19, 2013 FINDINGS: Gallbladder: No gallstones or wall  thickening visualized. There is no pericholecystic fluid. No sonographic Murphy sign noted by sonographer. Common bile duct: Diameter: 5 mm. No intrahepatic or extrahepatic biliary duct dilatation. Liver: No focal lesion identified. Liver echogenicity overall is increased. Portal vein is patent on color Doppler imaging with normal direction of blood flow towards the liver. IMPRESSION: Overall increase in liver echogenicity, a finding indicative of hepatic steatosis. While no focal liver lesions are evident on this study, it must be cautioned that the sensitivity of ultrasound for detection of focal liver lesions is diminished in this circumstance. Study otherwise unremarkable. Electronically Signed   By: Lowella Grip III M.D.   On: 08/25/2018 08:58    No results found for this or any previous visit (from the past 72 hour(s)).        ASSESSMENT/PLAN: The primary encounter diagnosis was Elevated liver enzymes. Diagnoses of Prediabetes, Acquired hypothyroidism, and Tension-type headache, not intractable, unspecified chronicity pattern were also pertinent to this visit.   Orders Placed This Encounter  Procedures  . Hepatic function panel     Printouts given for neck strain exercises, headache sounds more tension/postural in nature but would certainly have low threshold for getting an MRI for patient over 60 with new headache.  No alarm symptoms to warrant urgent imaging or referral,   Patient Instructions  Will double check thyroid Rx dose   Should be on 137 mcg  If not, let me know  Last TSH shows dose needed increased   Will recheck liver enzyme labs today  If high - to GI specialist   If lower - will watch these        Follow-up plan: Return for  recheck depending on blood work  .                                                 ################################################# ################################################# ################################################# #################################################    Current Meds  Medication Sig  . alendronate (FOSAMAX) 70 MG tablet Take 1 tablet (70 mg total) by mouth once a week. Take with a full glass of water on an empty stomach.  . Cholecalciferol (VITAMIN D-3 PO) Take 1 tablet by mouth daily.   [name] levothyroxine (SYNTHROID, LEVOTHROID) 137 MCG tablet Take 1 tablet (137 mcg total) by mouth daily before breakfast.  . Multiple Vitamin (MULTIVITAMIN) tablet Take 1 tablet by mouth daily.  . ondansetron (ZOFRAN) 8 MG tablet Take 1 tablet (8 mg total) by mouth every 8 (eight) hours as needed for nausea or vomiting.  . venlafaxine XR (EFFEXOR-XR) 150 MG 24 hr capsule Take 1 capsule (150 mg total) by mouth daily with breakfast.    Allergies  Allergen Reactions  . Ciprofloxacin Itching and Other (See Comments)    Patient states she had muscle spasms  . Macrobid [Nitrofurantoin] Other (See Comments)    Significant transaminitis  . Dilaudid [Hydromorphone Hcl] Itching  . Gluten Meal Other (See Comments)       Review of Systems:  Constitutional: No recent illness  HEENT: No  headache, no vision change  Cardiac: No  chest pain, No  pressure, No palpitations  Respiratory:  No  shortness of breath. No  Cough  Gastrointestinal: No  abdominal pain, no change on bowel habits  Musculoskeletal: No new myalgia/arthralgia  Skin: No  Rash  Hem/Onc: No  easy bruising/bleeding  Neurologic: No  weakness, No  Dizziness  Psychiatric: No  concerns with depression, No  concerns with anxiety  Exam:  BP 127/84 (BP Location: Left Arm, Patient Position: Sitting, Cuff Size: Normal)   Pulse 88   Temp 98 F (36.7 C) (Oral)   Ht 5\' 6"  (1.676 m)   Wt 174 lb 11.2  oz (79.2 kg)   LMP 09/11/2012   BMI 28.20 kg/m   Constitutional: VS see above. General Appearance: alert, well-developed, well-nourished, NAD  Eyes: Normal lids and conjunctive, non-icteric sclera, EOMI, PERRLA, no nystagmus.  Ears, Nose, Mouth, Throat: MMM, Normal external inspection ears/nares/mouth/lips/gums.  Neck: No masses, trachea midline.   Respiratory: Normal respiratory effort. no wheeze, no rhonchi, no rales  Cardiovascular: S1/S2 normal, no murmur, no rub/gallop auscultated. RRR.   Musculoskeletal: Gait normal. Symmetric and independent movement of all extremities  Abdominal: non-tender, non-distended, no appreciable organomegaly, neg Murphy's, BS [id number]  Neurological: Normal balance/coordination. No tremor.  Skin: warm, dry, intact.   Psychiatric: Normal judgment/insight. Normal mood and affect. Oriented x3.       Visit summary with medication list and pertinent instructions was printed for patient to review, patient was advised to alert [place] if any updates are needed. All questions at time of visit were answered - patient instructed to contact office with any additional concerns. ER/RTC precautions were reviewed with the patient and understanding verbalized.   Note: Total time spent 25 minutes, greater than 50% of the visit was spent face-to-face counseling and coordinating care for the following: The primary encounter diagnosis was Elevated liver enzymes. Diagnoses of Prediabetes, Acquired hypothyroidism, and Tension-type headache, not intractable, unspecified chronicity pattern were also pertinent to this visit.[name]  Please note: voice  recognition software was used to produce this document, and typos may escape review. Please contact Dr. Sheppard Coil for any needed clarifications.    Follow up plan: Return for recheck depending on blood work .

## 2018-08-28 LAB — HEPATIC FUNCTION PANEL
AG Ratio: 1.5 (calc) (ref 1.0–2.5)
ALT: 80 U/L — ABNORMAL HIGH (ref 6–29)
AST: 70 U/L — ABNORMAL HIGH (ref 10–35)
Albumin: 4.2 g/dL (ref 3.6–5.1)
Alkaline phosphatase (APISO): 208 U/L — ABNORMAL HIGH (ref 37–153)
Bilirubin, Direct: 0.1 mg/dL (ref 0.0–0.2)
Globulin: 2.8 g/dL (calc) (ref 1.9–3.7)
Indirect Bilirubin: 0.2 mg/dL (calc) (ref 0.2–1.2)
Total Bilirubin: 0.3 mg/dL (ref 0.2–1.2)
Total Protein: 7 g/dL (ref 6.1–8.1)

## 2018-08-29 NOTE — Addendum Note (Signed)
Addended by: Maryla Morrow on: 08/29/2018 12:46 PM   Modules accepted: Orders

## 2018-09-09 DIAGNOSIS — L821 Other seborrheic keratosis: Secondary | ICD-10-CM | POA: Diagnosis not present

## 2018-09-09 DIAGNOSIS — L570 Actinic keratosis: Secondary | ICD-10-CM | POA: Diagnosis not present

## 2018-09-18 ENCOUNTER — Encounter: Payer: Self-pay | Admitting: Osteopathic Medicine

## 2018-09-18 ENCOUNTER — Ambulatory Visit: Payer: BLUE CROSS/BLUE SHIELD | Admitting: Osteopathic Medicine

## 2018-09-18 VITALS — BP 143/82 | HR 104 | Temp 98.9°F | Wt 176.0 lb

## 2018-09-18 DIAGNOSIS — J069 Acute upper respiratory infection, unspecified: Secondary | ICD-10-CM | POA: Diagnosis not present

## 2018-09-18 DIAGNOSIS — B9789 Other viral agents as the cause of diseases classified elsewhere: Secondary | ICD-10-CM | POA: Diagnosis not present

## 2018-09-18 MED ORDER — HYDROCODONE-HOMATROPINE 5-1.5 MG/5ML PO SYRP: 5.0000 mL | ORAL_SOLUTION | Freq: Three times a day (TID) | ORAL | 0 refills | Status: DC | PRN

## 2018-09-18 MED ORDER — BENZONATATE 200 MG PO CAPS: 200.0000 mg | ORAL_CAPSULE | Freq: Three times a day (TID) | ORAL | 0 refills | Status: DC | PRN

## 2018-09-18 NOTE — Patient Instructions (Addendum)
Medications & Home Remedies for Upper Respiratory Illness    Aches/Pains, Fever, Headache OTC Acetaminophen (Tylenol) 500 mg tablets - take max 2 tablets (1000 mg) every 6 hours (4 times per day)  OTC Ibuprofen (Motrin) 200 mg tablets - take max 4 tablets (800 mg) every 6 hours   Sinus Congestion Prescription Atrovent as directed OTC Nasal Saline if desired to rinse OTC Oxymetolazone (Afrin, others) sparing use due to rebound congestion, NEVER use in kids OTC Phenylephrine (Sudafed) 10 mg tablets every 4 hours (or the 12-hour formulation) OTC Diphenhydramine (Benadryl) 25 mg tablets - take max 2 tablets every 4 hours   Cough & Sore Throat Prescription cough pills or syrups as directed OTC Dextromethorphan (Robitussin, others) - cough suppressant OTC Guaifenesin (Robitussin, Mucinex, others) - expectorant (helps cough up mucus) (Dextromethorphan and Guaifenesin also come in a combination tablet/syrup) OTC Lozenges w/ Benzocaine + Menthol (Cepacol) Honey - as much as you want! Teas which "coat the throat" - look for ingredients Elm Bark, Licorice Root, Marshmallow Root   Other OTC Zinc Lozenges within 24 hours of symptoms onset - mixed evidence this shortens the duration of the common cold Don't waste your money on Vitamin C or Echinacea in acute illness - it's already too late!

## 2018-09-18 NOTE — Progress Notes (Signed)
HPI: Nancy Beard is a 63 y.o. female who  has a past medical history of Breast cancer ([place]), Depression, DIVERTICULOSIS, COLON (05/05/2007), DVT (deep venous thrombosis) ([place]) (2014), DVT (deep venous thrombosis) ([place]) (06/30/2013), HEMORRHOIDS, EXTERNAL (05/05/2007), History of chemotherapy, Hypothyroidism, Neuropathy, Pneumonia, Sprue, Thyroid disease, Vitamin D insufficiency (06/20/2015), and Wears glasses.  she presents to [facility] today, 09/18/18,  for chief complaint of: Sick - respiratory   . Context: reports had flu in January  . Location/Quality: cough, chest congestion . Duration: 1 day . odifying factors: alka seltzer and Hall's cough drops  . Assoc signs/symptoms: subjective fever. Snoring last night.      Past medical, surgical, social and family history reviewed and updated as necessary.   Current medication list and allergy/intolerance information reviewed:    Current Outpatient Medications  Medication Sig Dispense Refill  . alendronate (FOSAMAX) 70 MG tablet Take 1 tablet (70 mg total) by mouth once a week. Take with a full glass of water on an empty stomach. 12 tablet 3  . Cholecalciferol (VITAMIN D-3 PO) Take 1 tablet by mouth daily.     [name] levothyroxine (SYNTHROID, LEVOTHROID) 137 MCG tablet Take 1 tablet (137 mcg total) by mouth daily before breakfast. 90 tablet 0  . Multiple Vitamin (MULTIVITAMIN) tablet Take 1 tablet by mouth daily.    . ondansetron (ZOFRAN) 8 MG tablet Take 1 tablet (8 mg total) by mouth every 8 (eight) hours as needed for nausea or vomiting. 30 tablet 1  . venlafaxine XR (EFFEXOR-XR) 150 MG 24 hr capsule Take 1 capsule (150 mg total) by mouth daily with breakfast. 90 capsule 3  . benzonatate (TESSALON) 200 MG capsule Take 1 capsule (200 mg total) by mouth 3 (three) times daily as needed for cough. 30 capsule 0  . HYDROcodone-homatropine (HYCODAN) 5-1.5 MG/5ML syrup Take 5 mLs by mouth every 8 (eight)  hours as needed for cough. 120 mL 0   No current facility-administered medications for this visit.     Allergies  Allergen Reactions  . Ciprofloxacin Itching and Other (See Comments)    Patient states she had muscle spasms  . Macrobid [Nitrofurantoin] Other (See Comments)    Significant transaminitis  . Dilaudid [Hydromorphone Hcl] Itching  . Gluten Meal Other (See Comments)      Review of Systems:  Constitutional:  +fever, +chills, +recent illness, No unintentional weight changes. +significant fatigue.   HEENT: +headache, no vision change, no hearing change, +sore throat, +sinus pressure  Cardiac: No  chest pain, No  pressure, No palpitations  Respiratory:  No  shortness of breath. +Cough  Gastrointestinal: No  abdominal pain, No  nausea, No  vomiting,  No  blood in stool, No  diarrhea  Musculoskeletal: No new myalgia/arthralgia  Skin: No  Rash  Neurologic: No  weakness, No  dizziness  Exam:  BP (!) 143/82 (BP Location: Left Arm, Patient Position: Sitting, Cuff Size: Normal)   Pulse (!) 104   Temp 98.9 F (37.2 C) (Oral)   Wt 176 lb (79.8 kg)   LMP 09/11/2012   SpO2 97%   BMI 28.41 kg/m   Constitutional: VS see above. General Appearance: alert, well-developed, well-nourished, NAD  Eyes: Normal lids and conjunctive, non-icteric sclera  Ears, Nose, Mouth, Throat: MMM, Normal external inspection ears/nares/mouth/lips/gums. TM normal bilaterally. Pharynx/tonsils no erythema, no exudate. Nasal mucosa normal.   Neck: No masses, trachea midline. No tenderness/mass appreciated. No lymphadenopathy  Respiratory: Normal respiratory effort. no wheeze, no rhonchi, no rales  Cardiovascular: S1/S2 normal, no murmur, no rub/gallop auscultated. RRR. No lower extremity edema.   Musculoskeletal: Gait normal.   Neurological: Normal balance/coordination. No tremor.   Skin: warm, dry, intact.   Psychiatric: Normal judgment/insight. Normal mood and  affect.        ASSESSMENT/PLAN: The encounter diagnosis was Viral URI with cough.   Meds ordered this encounter  Medications  . benzonatate (TESSALON) 200 MG capsule    Sig: Take 1 capsule (200 mg total) by mouth 3 (three) times daily as needed for cough.    Dispense:  30 capsule    Refill:  0  . HYDROcodone-homatropine (HYCODAN) 5-1.5 MG/5ML syrup    Sig: Take 5 mLs by mouth every 8 (eight) hours as needed for cough.    Dispense:  120 mL    Refill:  0     Patient Instructions  Medications & Home Remedies for Upper Respiratory Illness    Aches/Pains, Fever, Headache OTC Acetaminophen (Tylenol) 500 mg tablets - take max 2 tablets (1000 mg) every 6 hours (4 times per day)  OTC Ibuprofen (Motrin) 200 mg tablets - take max 4 tablets (800 mg) every 6 hours   Sinus Congestion Prescription Atrovent as directed OTC Nasal Saline if desired to rinse OTC Oxymetolazone (Afrin, others) sparing use due to rebound congestion, NEVER use in kids OTC Phenylephrine (Sudafed) 10 mg tablets every 4 hours (or the 12-hour formulation) OTC Diphenhydramine (Benadryl) 25 mg tablets - take max 2 tablets every 4 hours   Cough & Sore Throat Prescription cough pills or syrups as directed OTC Dextromethorphan (Robitussin, others) - cough suppressant OTC Guaifenesin (Robitussin, Mucinex, others) - expectorant (helps cough up mucus) (Dextromethorphan and Guaifenesin also come in a combination tablet/syrup) OTC Lozenges w/ Benzocaine + Menthol (Cepacol) Honey - as much as you want! Teas which "coat the throat" - look for ingredients Elm Bark, Licorice Root, Marshmallow Root   Other OTC Zinc Lozenges within 24 hours of symptoms onset - mixed evidence this shortens the duration of the common cold Don't waste your money on Vitamin C or Echinacea in acute illness - it's already too late!          Visit summary with medication list and pertinent instructions was printed for patient to review. All  questions at time of visit were answered - patient instructed to contact office with any additional concerns or updates. ER/RTC precautions were reviewed with the patient.   Follow-up plan: Return if symptoms worsen or fail to improve.    Please note: voice recognition software was used to produce this document, and typos may escape review. Please contact Dr. Sheppard Coil for any needed clarifications.

## 2018-09-24 ENCOUNTER — Other Ambulatory Visit: Payer: Self-pay | Admitting: Osteopathic Medicine

## 2018-10-02 ENCOUNTER — Encounter: Payer: Self-pay | Admitting: Adult Health

## 2018-10-02 ENCOUNTER — Encounter: Payer: Self-pay | Admitting: Internal Medicine

## 2018-10-31 ENCOUNTER — Telehealth: Payer: Self-pay | Admitting: Adult Health

## 2018-10-31 NOTE — Telephone Encounter (Signed)
Rescheduled LTS appt and changed to [facility] per sch msg. Patient will be contacted

## 2018-11-12 ENCOUNTER — Encounter: Payer: Self-pay | Admitting: Osteopathic Medicine

## 2018-11-12 ENCOUNTER — Ambulatory Visit (INDEPENDENT_AMBULATORY_CARE_PROVIDER_SITE_OTHER): Payer: BLUE CROSS/BLUE SHIELD | Admitting: Osteopathic Medicine

## 2018-11-12 ENCOUNTER — Encounter: Payer: Self-pay | Admitting: General Surgery

## 2018-11-12 VITALS — Wt 171.0 lb

## 2018-11-12 DIAGNOSIS — K900 Celiac disease: Secondary | ICD-10-CM

## 2018-11-12 DIAGNOSIS — E039 Hypothyroidism, unspecified: Secondary | ICD-10-CM | POA: Diagnosis not present

## 2018-11-12 DIAGNOSIS — E559 Vitamin D deficiency, unspecified: Secondary | ICD-10-CM | POA: Diagnosis not present

## 2018-11-12 DIAGNOSIS — R7303 Prediabetes: Secondary | ICD-10-CM

## 2018-11-12 DIAGNOSIS — R748 Abnormal levels of other serum enzymes: Secondary | ICD-10-CM | POA: Insufficient documentation

## 2018-11-12 DIAGNOSIS — Z8639 Personal history of other endocrine, nutritional and metabolic disease: Secondary | ICD-10-CM

## 2018-11-12 MED ORDER — ONDANSETRON HCL 8 MG PO TABS: 8.0000 mg | ORAL_TABLET | Freq: Three times a day (TID) | ORAL | 1 refills | Status: DC | PRN

## 2018-11-12 MED ORDER — VENLAFAXINE HCL ER 150 MG PO CP24: 150.0000 mg | ORAL_CAPSULE | Freq: Every day | ORAL | 3 refills | Status: DC

## 2018-11-12 NOTE — Progress Notes (Signed)
Virtual Visit  via Video or Phone Note  I connected with  Nancy Beard on 11/12/18 at 8:10 by a telemedicine application and verified that I am speaking with the correct person using two identifiers.   I discussed the limitations of evaluation and management by telemedicine and the availability of in person appointments. The patient expressed understanding and agreed to proceed.  History of Present Illness: Nancy Beard is a 63 y.o. female who would like to discuss labs  Needs repeat labs for elevated liver enzymes, thyroid. Pt is otherwise feeling well, working from home through the pandemic, feeling a bit fatigued but no other issues. Has appt w/ GI tomorrow. Pt ok to get labs done today.      Observations/Objective: Wt 171 lb (77.6 kg)   LMP 09/11/2012   BMI 27.60 kg/m  BP Readings from Last 3 Encounters:  09/18/18 (!) 143/82  08/27/18 127/84  05/14/18 130/81   Exam: Normal Speech.  NAD  Lab and Radiology Results No results found for this or any previous visit (from the past 72 hour(s)). No results found.     Assessment and Plan: 63 y.o. female with The primary encounter diagnosis was Prediabetes. Diagnoses of Elevated liver enzymes, Acquired hypothyroidism, Vitamin D deficiency, and History of high cholesterol were also pertinent to this visit.   PDMP not reviewed this encounter. Orders Placed This Encounter  Procedures  . CBC  . COMPLETE METABOLIC PANEL WITH GFR  . TSH  . T4, free  . Hemoglobin A1c  . VITAMIN D 25 Hydroxy (Vit-D Deficiency, Fractures)  . Lipid panel   Meds ordered this encounter  Medications  . ondansetron (ZOFRAN) 8 MG tablet    Sig: Take 1 tablet (8 mg total) by mouth every 8 (eight) hours as needed for nausea or vomiting.    Dispense:  30 tablet    Refill:  1  . venlafaxine XR (EFFEXOR-XR) 150 MG 24 hr capsule    Sig: Take 1 capsule (150 mg total) by mouth daily with breakfast.    Dispense:  90 capsule    Refill:  3    There are no Patient Instructions on file for this visit. Instructions sent via MyChart. If MyChart not available, pt was given option for info via personal e-mail w/ no guarantee of protected health info over unsecured e-mail communication, and MyChart sign-up instructions were included.   Follow Up Instructions: Return for recheck based on lab results and GI recommendations .    I discussed the assessment and treatment plan with the patient. The patient was provided an opportunity to ask questions and all were answered. The patient agreed with the plan and demonstrated an understanding of the instructions.   The patient was advised to call back or seek an in-person evaluation if the symptoms worsen or if the condition fails to improve as anticipated.  Provided 21 minutes of non-face-to-face time during this encounter.                      Historical information moved to improve visibility of documentation.  Past Medical History:  Diagnosis Date  . Breast cancer ([place])    right  . Depression   . DIVERTICULOSIS, COLON 05/05/2007  . DVT (deep venous thrombosis) ([place]) 2014   Right lower extremity, thigh  . DVT (deep venous thrombosis) ([place]) 06/30/2013   August 2014. Continued Xarelto due to persistent chronic DVT R popliteal vein. Plan repeat per oncology March 2016   .  HEMORRHOIDS, EXTERNAL 05/05/2007  . History of chemotherapy   . Hypothyroidism   . Neuropathy    of fingers and toes  . Pneumonia    hx of years ago  . Sprue   . Thyroid disease   . Vitamin D insufficiency 06/20/2015  . Wears glasses    Past Surgical History:  Procedure Laterality Date  . AXILLARY SENTINEL NODE BIOPSY Right 09/11/2012   Procedure: AXILLARY SENTINEL lymph NODE  BIOPSY;  Surgeon: Odis Hollingshead, MD;  Location: [place];  Service: General;  Laterality: Right;  right nuclear medicine injection 12:30   . BREAST LUMPECTOMY     benign lump  . BREAST RECONSTRUCTION Right 09/24/2013    Procedure: REVISION OF RIGHT BREAST RECONSTRUCTION WITH REPOSITIONING RIGHT IMPLANT, POSSIBLE EXCISION CAPSULAR CONTRACTURE AND LIPOFILLING FOR FAT GRAFTING;  Surgeon: Theodoro Kos, DO;  Location: [place];  Service: Plastics;  Laterality: Right;  . BREAST RECONSTRUCTION WITH PLACEMENT OF TISSUE EXPANDER AND FLEX HD (ACELLULAR HYDRATED DERMIS) Bilateral 09/11/2012   Procedure: BREAST RECONSTRUCTION WITH PLACEMENT OF TISSUE EXPANDER AND FLEX HD (ACELLULAR HYDRATED DERMIS) ADM;  Surgeon: Theodoro Kos, DO;  Location: [place];  Service: Plastics;  Laterality: Bilateral;  . COLONOSCOPY  2006  . cryoblation of cervix     long time ago  . ESOPHAGOGASTRODUODENOSCOPY  2007   sprue  . EXTERNAL FIXATION LEG Left 06/15/2015   Procedure: EXTERNAL FIXATION LEG;  Surgeon: Altamese Wickes, MD;  Location: [place];  Service: Orthopedics;  Laterality: Left;  . INCISION AND DRAINAGE OF WOUND Left 09/16/2012   Procedure: Left Breast Evacuation of Hematoma;  Surgeon: Theodoro Kos, DO;  Location: [place];  Service: Plastics;  Laterality: Left;  . LIPOSUCTION WITH LIPOFILLING Bilateral 09/24/2013   Procedure: LIPOSUCTION WITH LIPOFILLING;  Surgeon: Theodoro Kos, DO;  Location: [place];  Service: Plastics;  Laterality: Bilateral;  Biltalteal filling breast  . ORIF TIBIA PLATEAU Left 06/21/2015   Procedure: OPEN REDUCTION INTERNAL FIXATION (ORIF) TIBIAL PLATEAU REMOVAL OF EXTERNAL FISTULA;  Surgeon: Altamese Inglewood, MD;  Location: [place];  Service: Orthopedics;  Laterality: Left;  . PORT-A-CATH REMOVAL Right 08/07/2013   Procedure: REMOVAL PORT-A-CATH;  Surgeon: Odis Hollingshead, MD;  Location: [place];  Service: General;  Laterality: Right;  . PORTACATH PLACEMENT Right 10/09/2012   Procedure: US GUIDED INSERTION PORT-A-CATH;  Surgeon: Odis Hollingshead, MD;  Location: [place];  Service: General;  Laterality: Right;  Right Subclavian Vein  . REMOVAL OF BILATERAL  TISSUE EXPANDERS WITH PLACEMENT OF BILATERAL BREAST IMPLANTS Bilateral 06/24/2013   Procedure: REMOVAL OF BILATERAL TISSUE EXPANDERS WITH PLACEMENT OF BILATERAL BREAST IMPLANTS;  Surgeon: Theodoro Kos, DO;  Location: [place];  Service: Plastics;  Laterality: Bilateral;  . TOTAL MASTECTOMY Bilateral 09/11/2012   Procedure: bilateral MASTECTOMY;  Surgeon: Odis Hollingshead, MD;  Location: [place];  Service: General;  Laterality: Bilateral;  . VULVECTOMY N/A 07/14/2013   Procedure: WIDE LOCAL  EXCISION VULVAR;  Surgeon: Alvino Chapel, MD;  [facility]: [facility];  Service: Gynecology;  Laterality: N/A;   Social History   Tobacco Use  . Smoking status: Never Smoker  . Smokeless tobacco: Never Used  Substance Use Topics  . Alcohol use: No   family history includes Breast cancer in her paternal aunt, paternal grandmother, and another family member; COPD in her father; Heart attack in her paternal grandfather; Lymphoma in her paternal uncle; Osteoporosis in her mother.  Medications: Current Outpatient Medications  Medication  Sig Dispense Refill  . alendronate (FOSAMAX) 70 MG tablet Take 1 tablet (70 mg total) by mouth once a week. Take with a full glass of water on an empty stomach. 12 tablet 3  . Cholecalciferol (VITAMIN D-3 PO) Take 1 tablet by mouth daily.     [name] levothyroxine (SYNTHROID, LEVOTHROID) 137 MCG tablet Take 1 tablet (137 mcg total) by mouth daily before breakfast. 90 tablet 0  . Multiple Vitamin (MULTIVITAMIN) tablet Take 1 tablet by mouth daily.    . ondansetron (ZOFRAN) 8 MG tablet Take 1 tablet (8 mg total) by mouth every 8 (eight) hours as needed for nausea or vomiting. 30 tablet 1  . venlafaxine XR (EFFEXOR-XR) 150 MG 24 hr capsule Take 1 capsule (150 mg total) by mouth daily with breakfast. 90 capsule 3   No current facility-administered medications for this visit.    Allergies  Allergen Reactions  . Ciprofloxacin Itching and Other (See Comments)     Patient states she had muscle spasms  . Macrobid [Nitrofurantoin] Hives and Other (See Comments)    Significant transaminitis  . Dilaudid [Hydromorphone Hcl] Itching  . Gluten Meal Other (See Comments)    PDMP not reviewed this encounter. Orders Placed This Encounter  Procedures  . CBC  . COMPLETE METABOLIC PANEL WITH GFR  . TSH  . T4, free  . Hemoglobin A1c  . VITAMIN D 25 Hydroxy (Vit-D Deficiency, Fractures)  . Lipid panel   Meds ordered this encounter  Medications  . ondansetron (ZOFRAN) 8 MG tablet    Sig: Take 1 tablet (8 mg total) by mouth every 8 (eight) hours as needed for nausea or vomiting.    Dispense:  30 tablet    Refill:  1  . venlafaxine XR (EFFEXOR-XR) 150 MG 24 hr capsule    Sig: Take 1 capsule (150 mg total) by mouth daily with breakfast.    Dispense:  90 capsule    Refill:  3

## 2018-11-13 ENCOUNTER — Ambulatory Visit: Payer: Self-pay | Admitting: Internal Medicine

## 2018-11-13 ENCOUNTER — Other Ambulatory Visit: Payer: Self-pay

## 2018-11-13 ENCOUNTER — Encounter: Payer: Self-pay | Admitting: Internal Medicine

## 2018-11-13 ENCOUNTER — Ambulatory Visit (INDEPENDENT_AMBULATORY_CARE_PROVIDER_SITE_OTHER): Payer: BLUE CROSS/BLUE SHIELD | Admitting: Internal Medicine

## 2018-11-13 VITALS — Ht 66.0 in | Wt 172.0 lb

## 2018-11-13 DIAGNOSIS — K900 Celiac disease: Secondary | ICD-10-CM

## 2018-11-13 DIAGNOSIS — R748 Abnormal levels of other serum enzymes: Secondary | ICD-10-CM | POA: Diagnosis not present

## 2018-11-13 DIAGNOSIS — Z1211 Encounter for screening for malignant neoplasm of colon: Secondary | ICD-10-CM | POA: Diagnosis not present

## 2018-11-13 DIAGNOSIS — E611 Iron deficiency: Secondary | ICD-10-CM | POA: Diagnosis not present

## 2018-11-13 LAB — CBC
HCT: 43.3 % (ref 35.0–45.0)
Hemoglobin: 14.5 g/dL (ref 11.7–15.5)
MCH: 29.1 pg (ref 27.0–33.0)
MCHC: 33.5 g/dL (ref 32.0–36.0)
MCV: 86.8 fL (ref 80.0–100.0)
MPV: 11.7 fL (ref 7.5–12.5)
Platelets: 292 10*3/uL (ref 140–400)
RBC: 4.99 10*6/uL (ref 3.80–5.10)
RDW: 13.4 % (ref 11.0–15.0)
WBC: 6.1 10*3/uL (ref 3.8–10.8)

## 2018-11-13 LAB — LIPID PANEL
Cholesterol: 218 mg/dL — ABNORMAL HIGH (ref <200)
HDL: 44 mg/dL — ABNORMAL LOW (ref >=50)
LDL Cholesterol (Calc): 139 mg/dL (calc) — ABNORMAL HIGH
Non-HDL Cholesterol (Calc): 174 mg/dL (calc) — ABNORMAL HIGH (ref <130)
Total CHOL/HDL Ratio: 5 (calc) — ABNORMAL HIGH (ref <5.0)
Triglycerides: 210 mg/dL — ABNORMAL HIGH (ref <150)

## 2018-11-13 LAB — HEMOGLOBIN A1C
Hgb A1c MFr Bld: 5.8 % of total Hgb — ABNORMAL HIGH (ref <5.7)
Mean Plasma Glucose: 120 (calc)
eAG (mmol/L): 6.6 (calc)

## 2018-11-13 LAB — VITAMIN D 25 HYDROXY (VIT D DEFICIENCY, FRACTURES): Vit D, 25-Hydroxy: 37 ng/mL (ref 30–100)

## 2018-11-13 LAB — COMPLETE METABOLIC PANEL WITH GFR
AG Ratio: 1.8 (calc) (ref 1.0–2.5)
ALT: 59 U/L — ABNORMAL HIGH (ref 6–29)
AST: 49 U/L — ABNORMAL HIGH (ref 10–35)
Albumin: 4.4 g/dL (ref 3.6–5.1)
Alkaline phosphatase (APISO): 146 U/L (ref 37–153)
BUN: 13 mg/dL (ref 7–25)
CO2: 30 mmol/L (ref 20–32)
Calcium: 9.4 mg/dL (ref 8.6–10.4)
Chloride: 103 mmol/L (ref 98–110)
Creat: 0.63 mg/dL (ref 0.50–0.99)
GFR, Est African American: 111 mL/min/{1.73_m2} (ref >=60)
GFR, Est Non African American: 95 mL/min/{1.73_m2} (ref >=60)
Globulin: 2.4 g/dL (calc) (ref 1.9–3.7)
Glucose, Bld: 102 mg/dL — ABNORMAL HIGH (ref 65–99)
Potassium: 4.5 mmol/L (ref 3.5–5.3)
Sodium: 140 mmol/L (ref 135–146)
Total Bilirubin: 0.5 mg/dL (ref 0.2–1.2)
Total Protein: 6.8 g/dL (ref 6.1–8.1)

## 2018-11-13 LAB — T4, FREE: Free T4: 1.3 ng/dL (ref 0.8–1.8)

## 2018-11-13 LAB — TSH: TSH: 0.28 mIU/L — ABNORMAL LOW (ref 0.40–4.50)

## 2018-11-13 NOTE — Assessment & Plan Note (Signed)
Not really a new phenomenon.  Probably related to her celiac disease.  Ultrasound suggests fatty liver which goes along with that.  She is not compliant with her celiac diet.  She recalls having a virus perhaps the flu in January and that may have bumped up her transaminases then.  They are improving.  Plan to check her celiac antibodies

## 2018-11-13 NOTE — Patient Instructions (Signed)
It was great to talk to you today.  As we discussed I think the abnormal liver tests also known as transaminases were releated to a few things. If you had a viral illness in January that may have been part of it. Also the decreased thyroid function, and lack of maintaining a gluten free diet.  I will ask Dr. Sheppard Coil to place orders for tissue transglutaminase antibodies, and check a B12 level.  Even though you are not affected much by the celiac disease it is in your best interest to be gluten free. The low iron levels likely came from this and it can lead to extra fat in the liver also.  Please either follow through with the Cologuard or schedule a colonoscopy. Technically I think a colonoscopy would be better since your iron has been low - though quite possibly from the celiac disease we do not know and when iron is low a colonoscopy is often necessary. You can schedule a colonoscopy by calling us back. We can discuss this some more after I see the lab results.  I appreciate the opportunity to care for you. Gatha Mayer, MD, [facility]

## 2018-11-13 NOTE — Progress Notes (Signed)
TELEHEALTH ENCOUNTER IN SETTING OF COVID-19 PANDEMIC - REQUESTED BY PATIENT SERVICE PROVIDED BY TELEMEDECINE - TYPE: Telephone PATIENT LOCATION: Home PATIENT HAS CONSENTED TO TELEHEALTH VISIT PROVIDER LOCATION: OFFICE REFERRING PROVIDER: Dr. Emeterio Reeve PARTICIPANTS OTHER THAN PATIENT: None TIME SPENT ON CALL:20 mins    Nancy Beard 63 y.o. 09/21/1955 [id number]  Assessment & Plan:   Celiac disease/sprue TTG IgA Abs and B12 level to be checked Probably triggering increased LFT's as in past (but multifactorial this time I think) Advised rationale to remain gluten free  Abnormal transaminases Not really a new phenomenon.  Probably related to her celiac disease.  Ultrasound suggests fatty liver which goes along with that.  She is not compliant with her celiac diet.  She recalls having a virus perhaps the flu in January and that may have bumped up her transaminases then.  They are improving.  Plan to check her celiac antibodies  Iron deficiency Quite possibly related to malabsorption from celiac disease.  She is not interested in pursuing a screening colonoscopy, sort of qualifies for a diagnostic 1 which I explained to her in her patient instructions.     Subjective:   Chief Complaint: Abnormal transaminases  HPI The patient is a 63 year old woman remotely known to me, she has celiac disease diagnosed years ago and had an EGD and a colonoscopy in 2007 2006 respectively because she was iron deficient.  She had positive celiac antibodies and the EGD did confirm celiac disease.  I have not seen her in some time.  It looks like she has had abnormal transaminases related to noncompliance with gluten-free diet in the past when she was followed by Dr. Yong Channel.  In January she was seeing Dr. Sheppard Coil and her AST was 133 and her ALT was 183.  Subsequently in February 70 and 80 and then yesterday 49 and 59 respectively with normal bilirubin alk phos.  Hepatitis B surface antigen  and C antibody were negative.  She tells me she is not compliant with her celiac diet because she likes gravies and certain time she likes to eat bread.  When last checked her antibodies were markedly elevated in 2016.  History in the chart notes that her transaminases levels will fluctuate with compliance with gluten-free diet.  A few years ago she was also anemic and iron deficient she is been on a multivitamin with iron and that anemia has resolved though her ferritin is recently only 25.  GI review of systems otherwise negative. Allergies  Allergen Reactions   Ciprofloxacin Itching and Other (See Comments)    Patient states she had muscle spasms   Macrobid [Nitrofurantoin] Hives and Other (See Comments)    Significant transaminitis   Dilaudid [Hydromorphone Hcl] Itching   Gluten Meal Other (See Comments)   Current Meds  Medication Sig   alendronate (FOSAMAX) 70 MG tablet Take 1 tablet (70 mg total) by mouth once a week. Take with a full glass of water on an empty stomach.   Cholecalciferol (VITAMIN D-3 PO) Take 1 tablet by mouth daily.    levothyroxine (SYNTHROID, LEVOTHROID) 137 MCG tablet Take 1 tablet (137 mcg total) by mouth daily before breakfast.   Multiple Vitamin (MULTIVITAMIN) tablet Take 1 tablet by mouth daily.   ondansetron (ZOFRAN) 8 MG tablet Take 1 tablet (8 mg total) by mouth every 8 (eight) hours as needed for nausea or vomiting.   venlafaxine XR (EFFEXOR-XR) 150 MG 24 hr capsule Take 1 capsule (150 mg total) by mouth daily with  breakfast.   Past Medical History:  Diagnosis Date   Breast cancer ([place])    right   Celiac disease    Depression    DIVERTICULOSIS, COLON 05/05/2007   DVT (deep venous thrombosis) ([place]) 2014   Right lower extremity, thigh   DVT (deep venous thrombosis) ([place]) 06/30/2013   August 2014. Continued Xarelto due to persistent chronic DVT R popliteal vein. Plan repeat per oncology March 2016    HEMORRHOIDS, EXTERNAL 05/05/2007    History of chemotherapy    Hypothyroidism    Neuropathy    of fingers and toes   Pneumonia    hx of years ago   Thyroid disease    Vitamin D insufficiency 06/20/2015   Wears glasses    Past Surgical History:  Procedure Laterality Date   AXILLARY SENTINEL NODE BIOPSY Right 09/11/2012   Procedure: AXILLARY SENTINEL lymph NODE  BIOPSY;  Surgeon: Odis Hollingshead, MD;  Location: [place];  Service: General;  Laterality: Right;  right nuclear medicine injection 12:30    BREAST LUMPECTOMY     benign lump   BREAST RECONSTRUCTION Right 09/24/2013   Procedure: REVISION OF RIGHT BREAST RECONSTRUCTION WITH REPOSITIONING RIGHT IMPLANT, POSSIBLE EXCISION CAPSULAR CONTRACTURE AND LIPOFILLING FOR FAT GRAFTING;  Surgeon: Theodoro Kos, DO;  Location: [facility];  Service: Plastics;  Laterality: Right;   BREAST RECONSTRUCTION WITH PLACEMENT OF TISSUE EXPANDER AND FLEX HD (ACELLULAR HYDRATED DERMIS) Bilateral 09/11/2012   Procedure: BREAST RECONSTRUCTION WITH PLACEMENT OF TISSUE EXPANDER AND FLEX HD (ACELLULAR HYDRATED DERMIS) ADM;  Surgeon: Theodoro Kos, DO;  Location: [facility];  Service: Plastics;  Laterality: Bilateral;   COLONOSCOPY  2006   cryoblation of cervix     long time ago   ESOPHAGOGASTRODUODENOSCOPY  2007   sprue   EXTERNAL FIXATION LEG Left 06/15/2015   Procedure: EXTERNAL FIXATION LEG;  Surgeon: Altamese Edgewood, MD;  Location: [facility] [place];  Service: Orthopedics;  Laterality: Left;   INCISION AND DRAINAGE OF WOUND Left 09/16/2012   Procedure: Left Breast Evacuation of Hematoma;  Surgeon: Theodoro Kos, DO;  Location: [facility];  Service: Plastics;  Laterality: Left;   LIPOSUCTION WITH LIPOFILLING Bilateral 09/24/2013   Procedure: LIPOSUCTION WITH LIPOFILLING;  Surgeon: Theodoro Kos, DO;  Location: [facility];  Service: Plastics;  Laterality: Bilateral;  Biltalteal filling breast   ORIF TIBIA PLATEAU Left 06/21/2015   Procedure: OPEN REDUCTION INTERNAL FIXATION  (ORIF) TIBIAL PLATEAU REMOVAL OF EXTERNAL FISTULA;  Surgeon: Altamese Beacon, MD;  Location: [place];  Service: Orthopedics;  Laterality: Left;   PORT-A-CATH REMOVAL Right 08/07/2013   Procedure: REMOVAL PORT-A-CATH;  Surgeon: Odis Hollingshead, MD;  Location: [facility];  Service: General;  Laterality: Right;   PORTACATH PLACEMENT Right 10/09/2012   Procedure: US GUIDED INSERTION PORT-A-CATH;  Surgeon: Odis Hollingshead, MD;  Location: [place];  Service: General;  Laterality: Right;  Right Subclavian Vein   REMOVAL OF BILATERAL TISSUE EXPANDERS WITH PLACEMENT OF BILATERAL BREAST IMPLANTS Bilateral 06/24/2013   Procedure: REMOVAL OF BILATERAL TISSUE EXPANDERS WITH PLACEMENT OF BILATERAL BREAST IMPLANTS;  Surgeon: Theodoro Kos, DO;  Location: [facility];  Service: Plastics;  Laterality: Bilateral;   TOTAL MASTECTOMY Bilateral 09/11/2012   Procedure: bilateral MASTECTOMY;  Surgeon: Odis Hollingshead, MD;  Location: [facility];  Service: General;  Laterality: Bilateral;   VULVECTOMY N/A 07/14/2013   Procedure: WIDE LOCAL  EXCISION VULVAR;  Surgeon: Alvino Chapel, MD;  Location[facility];  Service: Gynecology;  Laterality:  N/A;   Social History   Social History Narrative   Married. 1 son- 1 grandson '29 lives in [place].       Works for [employer] full time.       Hobbies: reading/knitting/crochet/outside in fresh air   family history includes Breast cancer in her paternal aunt, paternal grandmother, and another family member; COPD in her father; Heart attack in her paternal grandfather; Lymphoma in her paternal uncle; Osteoporosis in her mother.   Review of Systems See HPI.  All other review of systems appears negative.

## 2018-11-13 NOTE — Assessment & Plan Note (Addendum)
TTG IgA Abs and B12 level to be checked Probably triggering increased LFT's as in past (but multifactorial this time I think) Advised rationale to remain gluten free

## 2018-11-13 NOTE — Assessment & Plan Note (Signed)
Quite possibly related to malabsorption from celiac disease.  She is not interested in pursuing a screening colonoscopy, sort of qualifies for a diagnostic 1 which I explained to her in her patient instructions.

## 2018-11-14 MED ORDER — LEVOTHYROXINE SODIUM 125 MCG PO TABS: 125.0000 ug | ORAL_TABLET | Freq: Every day | ORAL | 0 refills | Status: DC

## 2018-11-14 NOTE — Addendum Note (Signed)
Addended by: Maryla Morrow on: 11/14/2018 10:40 AM   Modules accepted: Orders

## 2018-11-20 LAB — TISSUE TRANSGLUTAMINASE, IGA: (tTG) Ab, IgA: >100 U/mL — ABNORMAL HIGH

## 2018-11-20 LAB — VITAMIN B12: Vitamin B-12: 434 pg/mL (ref 200–1100)

## 2018-11-25 ENCOUNTER — Telehealth: Payer: Self-pay | Admitting: Adult Health

## 2018-11-25 ENCOUNTER — Encounter: Payer: Self-pay | Admitting: Adult Health

## 2018-11-25 NOTE — Telephone Encounter (Signed)
Spoke with pt and she would like for the scheduling dept to cancel 11/26/18 appt and call to reschedule for a later date when she can come in and have face to fact office visit.

## 2018-11-25 NOTE — Telephone Encounter (Signed)
Go ahead and move out by 3 months

## 2018-11-25 NOTE — Progress Notes (Signed)
Dear Ms. Szumski,  The lab testing shows that you have uncontrolled celiac disease. I understand that it is difficult to follow a gluten-free diet but by not doing so you are causing liver inflammation and likely the iron-deficiency. It is really important to your health to be gluten-free. Celiac disease not treated can lead to lymphoma ( cancer of the lymph glands) and other serious problems. In addition fatigue, joint pains and other symptoms are often related.  I recommend that you work harder to be gluten-free and we can send you to a dietitian to review the diet again if you like though there are many books available. The Celiac [employer] Foundation can be found on line and it is another resource.   Another thought regarding the low iron is that a colonoscopy is reasonable to pursue. I think that the celiac disease is likely the cause of the low iron but it has been over 10 years since last colonoscopy and I feel obligated to recommend that to you if not for the iron-deficiency at least for colon cancer screening.  I am happy to answer any questions you have - can message me back.  I recommend that in 3 months you repeat the celiac antibodies, liver tests, and iron studies  (after being gluten free).  Please let me know if you want to do these at my office or Dr. Redgie Grayer and if you will set up a colonoscopy.  I have copied Dr. Sheppard Coil on this.  Best regards,  Gatha Mayer, MD, [facility]

## 2018-11-25 NOTE — Telephone Encounter (Signed)
Called regarding upcoming Webex appointment, patient would like to reschedule for a walk in visit. Appointment has been moved from May to August.

## 2018-11-26 ENCOUNTER — Inpatient Hospital Stay: Payer: BLUE CROSS/BLUE SHIELD | Admitting: Adult Health

## 2018-12-02 DIAGNOSIS — H10022 Other mucopurulent conjunctivitis, left eye: Secondary | ICD-10-CM | POA: Diagnosis not present

## 2018-12-07 ENCOUNTER — Other Ambulatory Visit: Payer: Self-pay | Admitting: Osteopathic Medicine

## 2019-02-03 ENCOUNTER — Other Ambulatory Visit: Payer: Self-pay | Admitting: Osteopathic Medicine

## 2019-02-03 DIAGNOSIS — E034 Atrophy of thyroid (acquired): Secondary | ICD-10-CM

## 2019-02-03 NOTE — Telephone Encounter (Signed)
Noted  

## 2019-02-03 NOTE — Telephone Encounter (Signed)
Refilled 30 days, orders are in for thyroid check, note on prescription that patient needs blood work and orders are in place, can come to the lab ASAP

## 2019-02-03 NOTE — Telephone Encounter (Signed)
[facility] requesting [facility] for levothyroxine. As per provider's last lab note - pt was to recheck thyroid levels in 6 wks from 11/12/18. Attempted to contact pt - no answer, left a detailed vm msg. Aware that blood work to recheck thyroid levels is necessary before refill is approve. Direct call back info provided. Pls order thyroid testing. Pls advise if refill is appropriate. Thanks.

## 2019-02-18 ENCOUNTER — Encounter: Payer: Self-pay | Admitting: Adult Health

## 2019-02-18 ENCOUNTER — Inpatient Hospital Stay: Payer: BC Managed Care – PPO | Attending: Adult Health | Admitting: Adult Health

## 2019-02-18 ENCOUNTER — Other Ambulatory Visit: Payer: Self-pay

## 2019-02-18 VITALS — BP 131/79 | HR 104 | Temp 98.5°F | Resp 18 | Ht 66.0 in | Wt 171.2 lb

## 2019-02-18 DIAGNOSIS — Z853 Personal history of malignant neoplasm of breast: Secondary | ICD-10-CM | POA: Insufficient documentation

## 2019-02-18 DIAGNOSIS — M810 Age-related osteoporosis without current pathological fracture: Secondary | ICD-10-CM | POA: Diagnosis not present

## 2019-02-18 DIAGNOSIS — C50311 Malignant neoplasm of lower-inner quadrant of right female breast: Secondary | ICD-10-CM

## 2019-02-18 NOTE — Progress Notes (Signed)
[facility] Follow up:    Nancy Reeve, DO [address] [place] [address]   DIAGNOSIS: Cancer Staging Primary cancer of lower-inner quadrant of right female breast [facility]) Staging form: Breast, AJCC 7th Edition - Clinical: Stage IA (T1c, N0, cM0) - Unsigned Staging comments: Staged at breast conference 12.18.13  - Pathologic: No stage assigned - Unsigned   SUMMARY OF ONCOLOGIC HISTORY: Oncology History  Primary cancer of lower-inner quadrant of right female breast ([place])  06/18/2012 Mammogram   screening mammogram in 06/2012 that showed a spiculated mass in the lower inner quadrant of the right breast. She had ultrasound performed that showed irregular mass at the 5:00 o'clock position 3 cm from the nipple measuring 1.1 cm.   06/19/2012 Initial Biopsy   invasive ductal carcinoma with DCIS, grade 3 tumor that was ER 2% PR negative HER-2/neu negative. Ki-67 was elevated at 34%   06/24/2012 Breast MRI   middle third of the lower inner quadrant of the right breast a 1.5 x 1.3 x 1.9 cm irregular enhancing mass.    06/24/2012 Initial Diagnosis   Cancer of lower-inner quadrant of female breast   09/11/2012 Surgery   Bilateral mastectomies   10/15/2012 - 03/03/2013 Chemotherapy   AC X 4 followed by Carbo-Taxol weekly X 12   03/30/2013 Procedure   DVT Right leg on Xarelto   05/19/2013 - 07/06/2015 Anti-estrogen oral therapy   Arimidex discontinued because of  left leg fracture (with a history of osteoporosis)   06/24/2015 Imaging   Left peroneal vein DVT when she developed left tibia and fibular fracture     CURRENT THERAPY: Observation  INTERVAL HISTORY: Nancy Beard 63 y.o. female returns for evlauation of her h/o breast cancer. She is doing well.  She has had no health changes.  She sees her PCP regularly and is up to date with her cancer screenings.     Patient Active Problem List   Diagnosis Date Noted  . Elevated liver  enzymes 11/12/2018  . Prediabetes 11/12/2018  . History of high cholesterol 11/12/2018  . Herpes simplex 04/12/2017  . Abnormal transaminases 06/27/2015  . Iron deficiency 06/24/2015  . Vitamin D deficiency 06/20/2015  . Fracture of left tibial plateau 06/15/2015  . Fracture tibia/fibula 06/14/2015  . Genital herpes 09/06/2014  . Neuropathy 09/06/2014  . Anticoagulant long-term use 05/01/2014  . Persistent headaches 02/22/2014  . Hot flashes related to aromatase inhibitor therapy 12/10/2013  . Vulvar intraepithelial neoplasia III (VIN III) 06/30/2013  . Status post bilateral breast reconstruction 06/30/2013  . Osteoporosis 05/13/2013  . Acquired absence of bilateral breasts and nipples 09/23/2012  . Primary cancer of lower-inner quadrant of right female breast ([place]) 06/24/2012  . Hyperlipidemia 05/13/2008  . Hypothyroidism 01/31/2007  . Depression 01/31/2007  . Celiac disease/sprue 01/31/2007    is allergic to ciprofloxacin; macrobid [nitrofurantoin]; dilaudid [hydromorphone hcl]; and gluten meal.  MEDICAL HISTORY: Past Medical History:  Diagnosis Date  . Breast cancer ([place])    right  . Celiac disease   . Depression   . DIVERTICULOSIS, COLON 05/05/2007  . DVT (deep venous thrombosis) ([place]) 2014   Right lower extremity, thigh  . DVT (deep venous thrombosis) ([place]) 06/30/2013   August 2014. Continued Xarelto due to persistent chronic DVT R popliteal vein. Plan repeat per oncology March 2016   . HEMORRHOIDS, EXTERNAL 05/05/2007  . History of chemotherapy   . Hypothyroidism   . Neuropathy    of fingers and toes  .  Pneumonia    hx of years ago  . Thyroid disease   . Vitamin D insufficiency 06/20/2015  . Wears glasses     SURGICAL HISTORY: Past Surgical History:  Procedure Laterality Date  . AXILLARY SENTINEL NODE BIOPSY Right 09/11/2012   Procedure: AXILLARY SENTINEL lymph NODE  BIOPSY;  Surgeon: Odis Hollingshead, MD;  Location: [facility];  Service: General;  Laterality:  Right;  right nuclear medicine injection 12:30   . BREAST LUMPECTOMY     benign lump  . BREAST RECONSTRUCTION Right 09/24/2013   Procedure: REVISION OF RIGHT BREAST RECONSTRUCTION WITH REPOSITIONING RIGHT IMPLANT, POSSIBLE EXCISION CAPSULAR CONTRACTURE AND LIPOFILLING FOR FAT GRAFTING;  Surgeon: Theodoro Kos, DO;  Location: [facility];  Service: Plastics;  Laterality: Right;  . BREAST RECONSTRUCTION WITH PLACEMENT OF TISSUE EXPANDER AND FLEX HD (ACELLULAR HYDRATED DERMIS) Bilateral 09/11/2012   Procedure: BREAST RECONSTRUCTION WITH PLACEMENT OF TISSUE EXPANDER AND FLEX HD (ACELLULAR HYDRATED DERMIS) ADM;  Surgeon: Theodoro Kos, DO;  Location: [facility];  Service: Plastics;  Laterality: Bilateral;  . COLONOSCOPY  2006  . cryoblation of cervix     long time ago  . ESOPHAGOGASTRODUODENOSCOPY  2007   sprue  . EXTERNAL FIXATION LEG Left 06/15/2015   Procedure: EXTERNAL FIXATION LEG;  Surgeon: Altamese Ivor, MD;  Location: [facility] [place];  Service: Orthopedics;  Laterality: Left;  . INCISION AND DRAINAGE OF WOUND Left 09/16/2012   Procedure: Left Breast Evacuation of Hematoma;  Surgeon: Theodoro Kos, DO;  Location: [facility];  Service: Plastics;  Laterality: Left;  . LIPOSUCTION WITH LIPOFILLING Bilateral 09/24/2013   Procedure: LIPOSUCTION WITH LIPOFILLING;  Surgeon: Theodoro Kos, DO;  Location: [facility];  Service: Plastics;  Laterality: Bilateral;  Biltalteal filling breast  . ORIF TIBIA PLATEAU Left 06/21/2015   Procedure: OPEN REDUCTION INTERNAL FIXATION (ORIF) TIBIAL PLATEAU REMOVAL OF EXTERNAL FISTULA;  Surgeon: Altamese Stacey Street, MD;  Location: [facility];  Service: Orthopedics;  Laterality: Left;  . PORT-A-CATH REMOVAL Right 08/07/2013   Procedure: REMOVAL PORT-A-CATH;  Surgeon: Odis Hollingshead, MD;  Location: [place];  Service: General;  Laterality: Right;  . PORTACATH PLACEMENT Right 10/09/2012   Procedure: US GUIDED INSERTION PORT-A-CATH;  Surgeon: Odis Hollingshead,  MD;  Location: [facility];  Service: General;  Laterality: Right;  Right Subclavian Vein  . REMOVAL OF BILATERAL TISSUE EXPANDERS WITH PLACEMENT OF BILATERAL BREAST IMPLANTS Bilateral 06/24/2013   Procedure: REMOVAL OF BILATERAL TISSUE EXPANDERS WITH PLACEMENT OF BILATERAL BREAST IMPLANTS;  Surgeon: Theodoro Kos, DO;  Location: [facility];  Service: Plastics;  Laterality: Bilateral;  . TOTAL MASTECTOMY Bilateral 09/11/2012   Procedure: bilateral MASTECTOMY;  Surgeon: Odis Hollingshead, MD;  Location: [facility];  Service: General;  Laterality: Bilateral;  . VULVECTOMY N/A 07/14/2013   Procedure: WIDE LOCAL  EXCISION VULVAR;  Surgeon: Alvino Chapel, MD;  Location: [facility];  Service: Gynecology;  Laterality: N/A;    SOCIAL HISTORY: Social History   Socioeconomic History  . Marital status: Married    Spouse name: Not on file  . Number of children: Not on file  . Years of education: Not on file  . Highest education level: Not on file  Occupational History  . Occupation: Conservation officer, historic buildings: [employer]  . Financial resource strain: Not on file  . Food insecurity    Worry: Not on file    Inability: Not on file  . Transportation needs  Medical: Not on file    Non-medical: Not on file  Tobacco Use  . Smoking status: Never Smoker  . Smokeless tobacco: Never Used  Substance and Sexual Activity  . Alcohol use: No  . Drug use: No  . Sexual activity: Yes    Birth control/protection: Post-menopausal  Lifestyle  . Physical activity    Days per week: Not on file    Minutes per session: Not on file  . Stress: Not on file  Relationships  . Social Herbalist on phone: Not on file    Gets together: Not on file    Attends religious service: Not on file    Active member of club or organization: Not on file    Attends meetings of clubs or organizations: Not on file    Relationship status: Not on file  . Intimate partner  violence    Fear of current or ex partner: Not on file    Emotionally abused: Not on file    Physically abused: Not on file    Forced sexual activity: Not on file  Other Topics Concern  . Not on file  Social History Narrative   Married. 1 son- 1 grandson '74 lives in [place].       Works for [employer] full time.       Hobbies: reading/knitting/crochet/outside in fresh air    FAMILY HISTORY: Family History  Problem Relation Age of Onset  . Breast cancer Paternal Aunt        dx in her 54s  . Lymphoma Paternal Uncle   . Breast cancer Paternal Grandmother        dx >50  . Heart attack Paternal Grandfather   . Breast cancer Other        2 maternal great aunts with breast cancer >50  . Osteoporosis Mother        controlled with diet/exercise  . COPD Father     Review of Systems  Constitutional: Negative for appetite change, chills, fatigue, fever and unexpected weight change.  HENT:   Negative for hearing loss, lump/mass and trouble swallowing.   Eyes: Negative for eye problems and icterus.  Respiratory: Negative for chest tightness, cough and shortness of breath.   Cardiovascular: Negative for chest pain, leg swelling and palpitations.  Gastrointestinal: Negative for abdominal distention, abdominal pain, constipation, diarrhea, nausea and vomiting.  Endocrine: Negative for hot flashes.  Genitourinary: Negative for difficulty urinating.   Musculoskeletal: Negative for arthralgias.  Skin: Negative for itching and rash.  Neurological: Negative for dizziness, extremity weakness, headaches and numbness.  Hematological: Negative for adenopathy. Does not bruise/bleed easily.  Psychiatric/Behavioral: Negative for depression. The patient is not nervous/anxious.       PHYSICAL EXAMINATION  ECOG PERFORMANCE STATUS: 0 - Asymptomatic  Vitals:   02/18/19 0914  BP: 131/79  Pulse: (!) 104  Resp: 18  Temp: 98.5 F (36.9 C)  SpO2: 98%    Physical Exam   Constitutional: She is oriented to person, place, and time and well-developed, well-nourished, and in no distress.  HENT:  Head: Normocephalic and atraumatic.  Mouth/Throat: Oropharynx is clear and moist. No oropharyngeal exudate.  Eyes: Pupils are equal, round, and reactive to light. No scleral icterus.  Neck: Neck supple.  Cardiovascular: Normal rate, regular rhythm and normal heart sounds.  Pulmonary/Chest: Effort normal and breath sounds normal. No respiratory distress. She has no wheezes. She has no rales.  Bilateral breast s/p mastectomy without nodules, masses or sign of  recurrence  Abdominal: Soft. Bowel sounds are normal. She exhibits no distension. There is no abdominal tenderness.  Musculoskeletal:        General: No edema.  Lymphadenopathy:    She has no cervical adenopathy.  Neurological: She is alert and oriented to person, place, and time.  Skin: Skin is warm and dry. No rash noted.  Psychiatric: Mood and affect normal.    LABORATORY DATA:  CBC    Component Value Date/Time   WBC 6.1 11/12/2018 0858   RBC 4.99 11/12/2018 0858   HGB 14.5 11/12/2018 0858   HGB 13.8 10/04/2015 1558   HCT 43.3 11/12/2018 0858   HCT 40.6 10/04/2015 1558   PLT 292 11/12/2018 0858   PLT 244 10/04/2015 1558   MCV 86.8 11/12/2018 0858   MCV 85.1 10/04/2015 1558   MCH 29.1 11/12/2018 0858   MCHC 33.5 11/12/2018 0858   RDW 13.4 11/12/2018 0858   RDW 14.9 (H) 10/04/2015 1558   LYMPHSABS 2.8 10/04/2015 1558   MONOABS 0.8 10/04/2015 1558   EOSABS 0.2 10/04/2015 1558   BASOSABS 0.0 10/04/2015 1558    CMP     Component Value Date/Time   NA 140 11/12/2018 0858   NA 138 10/04/2015 1558   K 4.5 11/12/2018 0858   K 3.9 10/04/2015 1558   CL 103 11/12/2018 0858   CL 105 01/06/2013 0827   CO2 30 11/12/2018 0858   CO2 25 10/04/2015 1558   GLUCOSE 102 (H) 11/12/2018 0858   GLUCOSE 117 10/04/2015 1558   GLUCOSE 97 01/06/2013 0827   BUN 13 11/12/2018 0858   BUN 11.3 10/04/2015 1558    CREATININE 0.63 11/12/2018 0858   CREATININE 0.7 10/04/2015 1558   CALCIUM 9.4 11/12/2018 0858   CALCIUM 9.2 10/04/2015 1558   PROT 6.8 11/12/2018 0858   PROT 7.4 10/04/2015 1558   ALBUMIN 4.0 10/04/2015 1558   AST 49 (H) 11/12/2018 0858   AST 36 (H) 10/04/2015 1558   ALT 59 (H) 11/12/2018 0858   ALT 54 10/04/2015 1558   ALKPHOS 194 (H) 10/04/2015 1558   BILITOT 0.5 11/12/2018 0858   BILITOT 0.41 10/04/2015 1558   GFRNONAA 95 11/12/2018 0858   GFRAA 111 11/12/2018 0858     ASSESSMENT and PLAN:   Primary cancer of lower-inner quadrant of right female breast Right breast invasive ductal carcinoma with DCIS, grade 3, ER 2%, PR 0%, HER-2 negative, Ki-67 34%, status post bilateral mastectomies and reconstruction, adjuvant chemotherapy with before meals 4 followed by [name] Taxol 12, antiestrogen therapy with Arimidex started 05/19/2013 eventhough she has only 2% ER positivity stopped 07/06/2015 (due to osteoporotic fracture tibia and due to 2% ER positivity)  Nancy Beard is doing well today.  She has no clinical signs of breast cancer recurrence.  I recommended that since she has bilateral silicone implants, that she see Dr. Marla Roe to review whether or not she needs any imaging to evaluate for silent rupture.  Nancy Beard will return in 1 year for continued follow up and monitoring.    Cindy and I discussed healthy diet and exercise.  I recommended she exercise 30 minutes at least 5 days a week.    I recommended that Nancy Beard continue to f/u with her PCP and stay up to date with her colon cancer, skin cancer, and gyn cancer screenings.    Dispo:  Return in one year for LTS follow up Referral sent for Dr. Marla Roe Continue with the appropriate pandemic precautions.       All questions  were answered. The patient knows to call the clinic with any problems, questions or concerns. We can certainly see the patient much sooner if necessary.  A total of (30) minutes of face-to-face time was  spent with this patient with greater than 50% of that time in counseling and care-coordination.  This note was electronically signed. Scot Dock, NP 02/18/2019

## 2019-02-18 NOTE — Assessment & Plan Note (Signed)
Right breast invasive ductal carcinoma with DCIS, grade 3, ER 2%, PR 0%, HER-2 negative, Ki-67 34%, status post bilateral mastectomies and reconstruction, adjuvant chemotherapy with before meals 4 followed by [name] Taxol 12, antiestrogen therapy with Arimidex started 05/19/2013 eventhough she has only 2% ER positivity stopped 07/06/2015 (due to osteoporotic fracture tibia and due to 2% ER positivity)  Nancy Beard is doing well today.  She has no clinical signs of breast cancer recurrence.  I recommended that since she has bilateral silicone implants, that she see Dr. Marla Roe to review whether or not she needs any imaging to evaluate for silent rupture.  Nancy Beard will return in 1 year for continued follow up and monitoring.    Cindy and I discussed healthy diet and exercise.  I recommended she exercise 30 minutes at least 5 days a week.    I recommended that Nancy Beard continue to f/u with her PCP and stay up to date with her colon cancer, skin cancer, and gyn cancer screenings.    Dispo:  Return in one year for LTS follow up Referral sent for Dr. Marla Roe Continue with the appropriate pandemic precautions.

## 2019-02-19 ENCOUNTER — Telehealth: Payer: Self-pay | Admitting: Adult Health

## 2019-02-19 LAB — TSH: TSH: 0.17 mIU/L — ABNORMAL LOW (ref 0.40–4.50)

## 2019-02-19 NOTE — Telephone Encounter (Signed)
I talk with patient regarding schedule  

## 2019-02-20 ENCOUNTER — Other Ambulatory Visit: Payer: Self-pay | Admitting: Osteopathic Medicine

## 2019-02-20 DIAGNOSIS — E039 Hypothyroidism, unspecified: Secondary | ICD-10-CM

## 2019-02-20 MED ORDER — LEVOTHYROXINE SODIUM 112 MCG PO TABS: 112.0000 ug | ORAL_TABLET | Freq: Every day | ORAL | 0 refills | Status: DC

## 2019-04-06 ENCOUNTER — Telehealth: Payer: Self-pay

## 2019-04-06 NOTE — Telephone Encounter (Signed)
Faxed was received by the Access Nurse. She had a fever, cough and congestion. The on call provider sent in an antibiotic. She states she is still coughing and would like cough medication. Please advise.

## 2019-04-07 MED ORDER — BENZONATATE 200 MG PO CAPS: 200.0000 mg | ORAL_CAPSULE | Freq: Three times a day (TID) | ORAL | 0 refills | Status: DC | PRN

## 2019-04-07 NOTE — Telephone Encounter (Signed)
Patient advised.

## 2019-04-07 NOTE — Telephone Encounter (Signed)
Sent in [place].  If not sufficient can use opiate-containing cough syrups.

## 2019-04-10 LAB — TSH: TSH: 1.17 mIU/L (ref 0.40–4.50)

## 2019-04-17 ENCOUNTER — Other Ambulatory Visit: Payer: Self-pay | Admitting: Osteopathic Medicine

## 2019-04-17 NOTE — Telephone Encounter (Signed)
Requested medication (s) are due for refill today: yes  Requested medication (s) are on the active medication list: yes  Last refill: 02/20/2019  Future visit scheduled: no  Notes to clinic: review for refill   Requested Prescriptions  Pending Prescriptions Disp Refills   levothyroxine (SYNTHROID) 112 MCG tablet [Pharmacy Med Name: LEVOTHYROXINE 112 MCG TABLET] 60 tablet 0    Sig: TAKE 1 TABLET EVERY MORNING ON EMPTY STOMACH     Endocrinology:  Hypothyroid Agents Failed - 04/17/2019  1:03 PM      Failed - TSH needs to be rechecked within 3 months after an abnormal result. Refill until TSH is due.      Failed - Valid encounter within last 12 months    Recent Outpatient Visits          5 months ago Prediabetes   [facility], [place], DO   7 months ago Viral URI with cough   [facility], [place], DO   7 months ago Elevated liver enzymes   [facility], Lanelle Bal, DO   11 months ago Annual physical exam   [facility], Lanelle Bal, DO   1 year ago Prediabetes   [facility], [place], DO             Passed - TSH in normal range and within 360 days    TSH  Date Value Ref Range Status  04/09/2019 1.17 0.40 - 4.50 mIU/L Final

## 2019-05-05 ENCOUNTER — Encounter: Payer: Self-pay | Admitting: Plastic Surgery

## 2019-05-05 ENCOUNTER — Ambulatory Visit: Payer: BC Managed Care – PPO | Admitting: Plastic Surgery

## 2019-05-05 ENCOUNTER — Other Ambulatory Visit: Payer: Self-pay

## 2019-05-05 VITALS — BP 156/90 | HR 89 | Temp 97.8°F | Ht 66.0 in | Wt 171.0 lb

## 2019-05-05 DIAGNOSIS — Z9889 Other specified postprocedural states: Secondary | ICD-10-CM | POA: Diagnosis not present

## 2019-05-05 NOTE — Progress Notes (Signed)
   Subjective:    Patient ID: Darrall Dears, female    DOB: 1955-10-16, 63 y.o.   MRN: [id number]  The patient is a 63 year old female here for a yearly follow-up on her breast reconstruction.  She underwent bilateral mastectomies with implant reconstruction in 2014.  She had lipophilic in [id number].  She is 5 feet 6 inches tall and weighs 171 pounds.  She is still happy with her results.  She has good symmetry.  She wants to make sure that there has not been any deflation of her implants.  She is not worried about the lungs.  She tells me she recently moved to [place] from [place] to downsize.  She is closer to her grandson so she is very happy about this.  She and her husband are doing well.  On exam there is no sign of capsular contracture.  I do not feel anything that is concerning for a leakage of the implant.  All incisions are healing well.  She has not had her nipple areola tattoo done yet.   Review of Systems  Constitutional: Negative for activity change and appetite change.  HENT: Negative.   Eyes: Negative.   Respiratory: Negative.   Cardiovascular: Negative.   Gastrointestinal: Negative.   Genitourinary: Negative.   Musculoskeletal: Negative.   Hematological: Negative.   Psychiatric/Behavioral: Negative.        Objective:   Physical Exam Vitals signs and nursing note reviewed.  Constitutional:      Appearance: Normal appearance.  HENT:     Head: Normocephalic and atraumatic.  Cardiovascular:     Rate and Rhythm: Normal rate.     Pulses: Normal pulses.  Pulmonary:     Effort: Pulmonary effort is normal.  Abdominal:     General: Abdomen is flat. There is no distension.     Tenderness: There is no abdominal tenderness.  Neurological:     General: No focal deficit present.     Mental Status: She is alert and oriented to person, place, and time.  Psychiatric:        Mood and Affect: Mood normal.        Behavior: Behavior normal.        Thought Content: Thought  content normal.        Assessment & Plan:     ICD-10-CM   1. Status post bilateral breast reconstruction  Z98.890     Pictures were obtained of the patient and placed in the chart with the patient's or guardian's permission.  We talked about the lifespan of the implant and the evaluation of it.  She is a candidate for ultrasound or she is concerned at all about the implant.  She said that she is now and is okay without evaluating it at this time from a radiographic standpoint. The patient is interested in nipple areolar reconstruction.  We talked about having a tattoo done and she is open to this idea.

## 2019-05-08 ENCOUNTER — Telehealth (INDEPENDENT_AMBULATORY_CARE_PROVIDER_SITE_OTHER): Payer: BC Managed Care – PPO

## 2019-05-08 DIAGNOSIS — Z760 Encounter for issue of repeat prescription: Secondary | ICD-10-CM | POA: Diagnosis not present

## 2019-05-08 NOTE — Telephone Encounter (Signed)
Pt left a vm msg stating she's been having cold / fever sores pop up on her lips for the past 4 wks. Pt mentioned she occasionally gets them around her nose and the sores go away. She is currently using Abreva cream to help but wants a rx [facility]. Pls advise, thanks.

## 2019-05-11 MED ORDER — VALACYCLOVIR HCL 1 G PO TABS: 2000.0000 mg | ORAL_TABLET | Freq: Two times a day (BID) | ORAL | 0 refills | Status: AC

## 2019-05-11 NOTE — Telephone Encounter (Signed)
Sent prescription for as needed Valtrex, if patient is interested in suppressive daily therapy to prevent cold sore outbreaks, please let me know.  5 Minutes spent.

## 2019-05-11 NOTE — Telephone Encounter (Signed)
Pt has been updated of Valtrex rx. Pt would like provider to send in a rx for suppressive daily therapy. [facility] No other inquiries during call.

## 2019-06-11 ENCOUNTER — Other Ambulatory Visit: Payer: Self-pay | Admitting: Osteopathic Medicine

## 2019-08-07 ENCOUNTER — Other Ambulatory Visit: Payer: Self-pay | Admitting: Osteopathic Medicine

## 2019-09-30 ENCOUNTER — Other Ambulatory Visit: Payer: Self-pay | Admitting: Osteopathic Medicine

## 2019-09-30 NOTE — Telephone Encounter (Signed)
Please review for refill- patient at [facility] 

## 2019-10-08 ENCOUNTER — Encounter: Payer: Self-pay | Admitting: Osteopathic Medicine

## 2019-10-08 DIAGNOSIS — L720 Epidermal cyst: Secondary | ICD-10-CM | POA: Diagnosis not present

## 2019-10-08 DIAGNOSIS — L820 Inflamed seborrheic keratosis: Secondary | ICD-10-CM | POA: Diagnosis not present

## 2019-11-01 ENCOUNTER — Other Ambulatory Visit: Payer: Self-pay | Admitting: Osteopathic Medicine

## 2019-11-19 DIAGNOSIS — Z124 Encounter for screening for malignant neoplasm of cervix: Secondary | ICD-10-CM | POA: Diagnosis not present

## 2019-11-19 DIAGNOSIS — Z01419 Encounter for gynecological examination (general) (routine) without abnormal findings: Secondary | ICD-10-CM | POA: Diagnosis not present

## 2019-11-19 DIAGNOSIS — Z6829 Body mass index (BMI) 29.0-29.9, adult: Secondary | ICD-10-CM | POA: Diagnosis not present

## 2019-12-01 ENCOUNTER — Other Ambulatory Visit: Payer: Self-pay | Admitting: Osteopathic Medicine

## 2020-02-07 ENCOUNTER — Other Ambulatory Visit: Payer: Self-pay | Admitting: Osteopathic Medicine

## 2020-02-19 ENCOUNTER — Inpatient Hospital Stay: Payer: BC Managed Care – PPO | Admitting: Adult Health

## 2020-02-22 ENCOUNTER — Ambulatory Visit (INDEPENDENT_AMBULATORY_CARE_PROVIDER_SITE_OTHER): Payer: BC Managed Care – PPO | Admitting: Osteopathic Medicine

## 2020-02-22 VITALS — BP 147/80 | HR 87 | Wt 179.0 lb

## 2020-02-22 DIAGNOSIS — R7303 Prediabetes: Secondary | ICD-10-CM

## 2020-02-22 DIAGNOSIS — Z0000 Encounter for general adult medical examination without abnormal findings: Secondary | ICD-10-CM | POA: Diagnosis not present

## 2020-02-22 DIAGNOSIS — E039 Hypothyroidism, unspecified: Secondary | ICD-10-CM

## 2020-02-22 DIAGNOSIS — E559 Vitamin D deficiency, unspecified: Secondary | ICD-10-CM

## 2020-02-22 DIAGNOSIS — R748 Abnormal levels of other serum enzymes: Secondary | ICD-10-CM

## 2020-02-22 MED ORDER — LEVOTHYROXINE SODIUM 112 MCG PO TABS: 112.0000 ug | ORAL_TABLET | Freq: Every day | ORAL | 1 refills | Status: DC

## 2020-02-22 MED ORDER — VENLAFAXINE HCL ER 150 MG PO CP24: 150.0000 mg | ORAL_CAPSULE | Freq: Every day | ORAL | 3 refills | Status: DC

## 2020-02-22 MED ORDER — PROCHLORPERAZINE MALEATE 5 MG PO TABS: 5.0000 mg | ORAL_TABLET | Freq: Four times a day (QID) | ORAL | 0 refills | Status: DC | PRN

## 2020-02-22 MED ORDER — AMBULATORY NON FORMULARY MEDICATION: 99 refills | Status: AC

## 2020-02-22 NOTE — Progress Notes (Signed)
Nancy Beard is a 64 y.o. female who presents to  [place] at [facility]  today, 02/22/20, seeking care for the following:  [name] Med refill - effexor . Concern for BP above normal    BP Readings from Last 3 Encounters:  02/22/20 (!) 147/80  05/05/19 (!) 156/90  02/18/19 131/79     ASSESSMENT & PLAN with other pertinent findings:  The primary encounter diagnosis was Acquired hypothyroidism. Diagnoses of Annual physical exam, Vitamin D deficiency, Elevated liver enzymes, and Prediabetes were also pertinent to this visit.   Ambulatory BP monitoring, home device Rx written Pt has upcoming appt can recheck BP then too  Labs as below    Patient Instructions  Please call Dr Carlean Purl (Gastroenterology) to schedule follow-up on Celiac and schedule colonoscopy.   Refills sent for medications  Labs / annual upcoming - get blood work about a week before your visit      Orders Placed This Encounter  Procedures  . Tissue transglutaminase, IgA  . Tissue transglutaminase, IgG  . CBC  . COMPLETE METABOLIC PANEL WITH GFR  . Lipid panel  . TSH  . Hemoglobin A1c  . VITAMIN D 25 Hydroxy (Vit-D Deficiency, Fractures)  . Vitamin B12  . Fe+TIBC+Fer    Meds ordered this encounter  Medications  . levothyroxine (SYNTHROID) 112 MCG tablet    Sig: Take 1 tablet (112 mcg total) by mouth daily before breakfast.    Dispense:  90 tablet    Refill:  1  . venlafaxine XR (EFFEXOR-XR) 150 MG 24 hr capsule    Sig: Take 1 capsule (150 mg total) by mouth daily with breakfast.    Dispense:  90 capsule    Refill:  3  . prochlorperazine (COMPAZINE) 5 MG tablet    Sig: Take 1 tablet (5 mg total) by mouth every 6 (six) hours as needed for nausea or vomiting.    Dispense:  30 tablet    Refill:  0  . AMBULATORY NON FORMULARY MEDICATION    Sig: BLOOD PRESSURE MONITOR - ARM CUFF PER PATIENT PREFERENCE / INSURANCE COVERAGE, DX: ELEVATED BLOOD PRESSURE     Dispense:  1 Units    Refill:  99       Follow-up instructions: Return for KEEP CURRENTLY SCHEDULED APPOINTMENT.                                         BP (!) 147/80 (BP Location: Left Arm, Patient Position: Sitting)   Pulse 87   Wt 179 lb (81.2 kg)   LMP 09/11/2012   SpO2 97%   BMI 28.89 kg/m   Current Meds  Medication Sig  . alendronate (FOSAMAX) 70 MG tablet Take 1 tablet (70 mg total) by mouth once a week. Take with a full glass of water on an empty stomach.  . Cholecalciferol (VITAMIN D-3 PO) Take 1 tablet by mouth daily.   [name] levothyroxine (SYNTHROID) 112 MCG tablet Take 1 tablet (112 mcg total) by mouth daily before breakfast.  . Multiple Vitamin (MULTIVITAMIN) tablet Take 1 tablet by mouth daily.  [name] venlafaxine XR (EFFEXOR-XR) 150 MG 24 hr capsule Take 1 capsule (150 mg total) by mouth daily with breakfast.  . [DISCONTINUED] benzonatate (TESSALON) 200 MG capsule Take 1 capsule (200 mg total) by mouth 3 (three) times daily as needed for cough.  . [DISCONTINUED] levothyroxine (  SYNTHROID) 112 MCG tablet TAKE 1 TABLET EVERY MORNING ON EMPTY STOMACH  . [DISCONTINUED] ondansetron (ZOFRAN) 8 MG tablet Take 1 tablet (8 mg total) by mouth every 8 (eight) hours as needed for nausea or vomiting.  . [DISCONTINUED] venlafaxine XR (EFFEXOR-XR) 150 MG 24 hr capsule Take 1 capsule (150 mg total) by mouth daily with breakfast.    No results found for this or any previous visit (from the past 72 hour(s)).  No results found.     All questions at time of visit were answered - patient instructed to contact office with any additional concerns or updates.  ER/RTC precautions were reviewed with the patient as applicable.   Please note: voice recognition software was used to produce this document, and typos may escape review. Please contact Dr. Sheppard Coil for any needed clarifications.

## 2020-02-22 NOTE — Patient Instructions (Addendum)
Please call Dr Carlean Purl (Gastroenterology) to schedule follow-up on Celiac and schedule colonoscopy.   Refills sent for medications  Labs / annual upcoming - get blood work about a week before your visit

## 2020-03-25 LAB — COMPLETE METABOLIC PANEL WITH GFR
AG Ratio: 1.7 (calc) (ref 1.0–2.5)
ALT: 52 U/L — ABNORMAL HIGH (ref 6–29)
AST: 42 U/L — ABNORMAL HIGH (ref 10–35)
Albumin: 4.3 g/dL (ref 3.6–5.1)
Alkaline phosphatase (APISO): 160 U/L — ABNORMAL HIGH (ref 37–153)
BUN: 10 mg/dL (ref 7–25)
CO2: 27 mmol/L (ref 20–32)
Calcium: 9.1 mg/dL (ref 8.6–10.4)
Chloride: 103 mmol/L (ref 98–110)
Creat: 0.65 mg/dL (ref 0.50–0.99)
GFR, Est African American: 108 mL/min/{1.73_m2} (ref >=60)
GFR, Est Non African American: 93 mL/min/{1.73_m2} (ref >=60)
Globulin: 2.6 g/dL (calc) (ref 1.9–3.7)
Glucose, Bld: 98 mg/dL (ref 65–99)
Potassium: 4.6 mmol/L (ref 3.5–5.3)
Sodium: 140 mmol/L (ref 135–146)
Total Bilirubin: 0.4 mg/dL (ref 0.2–1.2)
Total Protein: 6.9 g/dL (ref 6.1–8.1)

## 2020-03-25 LAB — CBC
HCT: 37.9 % (ref 35.0–45.0)
Hemoglobin: 12.7 g/dL (ref 11.7–15.5)
MCH: 28.7 pg (ref 27.0–33.0)
MCHC: 33.5 g/dL (ref 32.0–36.0)
MCV: 85.6 fL (ref 80.0–100.0)
MPV: 11.6 fL (ref 7.5–12.5)
Platelets: 331 10*3/uL (ref 140–400)
RBC: 4.43 10*6/uL (ref 3.80–5.10)
RDW: 13.7 % (ref 11.0–15.0)
WBC: 5.7 10*3/uL (ref 3.8–10.8)

## 2020-03-25 LAB — TISSUE TRANSGLUTAMINASE, IGA: (tTG) Ab, IgA: >100 U/mL — ABNORMAL HIGH

## 2020-03-25 LAB — IRON,TIBC AND FERRITIN PANEL
%SAT: 8 % (calc) — ABNORMAL LOW (ref 16–45)
Ferritin: 7 ng/mL — ABNORMAL LOW (ref 16–288)
Iron: 39 ug/dL — ABNORMAL LOW (ref 45–160)
TIBC: 482 mcg/dL (calc) — ABNORMAL HIGH (ref 250–450)

## 2020-03-25 LAB — LIPID PANEL
Cholesterol: 225 mg/dL — ABNORMAL HIGH (ref <200)
HDL: 46 mg/dL — ABNORMAL LOW (ref >=50)
LDL Cholesterol (Calc): 152 mg/dL (calc) — ABNORMAL HIGH
Non-HDL Cholesterol (Calc): 179 mg/dL (calc) — ABNORMAL HIGH (ref <130)
Total CHOL/HDL Ratio: 4.9 (calc) (ref <5.0)
Triglycerides: 144 mg/dL (ref <150)

## 2020-03-25 LAB — TSH: TSH: 1.66 mIU/L (ref 0.40–4.50)

## 2020-03-25 LAB — TISSUE TRANSGLUTAMINASE, IGG: (tTG) Ab, IgG: 25 U/mL — ABNORMAL HIGH

## 2020-03-25 LAB — HEMOGLOBIN A1C
Hgb A1c MFr Bld: 5.7 % of total Hgb — ABNORMAL HIGH (ref <5.7)
Mean Plasma Glucose: 117 (calc)
eAG (mmol/L): 6.5 (calc)

## 2020-03-25 LAB — VITAMIN B12: Vitamin B-12: 238 pg/mL (ref 200–1100)

## 2020-03-25 LAB — VITAMIN D 25 HYDROXY (VIT D DEFICIENCY, FRACTURES): Vit D, 25-Hydroxy: 17 ng/mL — ABNORMAL LOW (ref 30–100)

## 2020-04-20 DIAGNOSIS — S0502XA Injury of conjunctiva and corneal abrasion without foreign body, left eye, initial encounter: Secondary | ICD-10-CM | POA: Diagnosis not present

## 2020-04-27 ENCOUNTER — Encounter: Payer: BC Managed Care – PPO | Admitting: Osteopathic Medicine

## 2020-04-28 ENCOUNTER — Encounter: Payer: Self-pay | Admitting: Nurse Practitioner

## 2020-04-28 ENCOUNTER — Other Ambulatory Visit: Payer: Self-pay

## 2020-04-28 ENCOUNTER — Ambulatory Visit (INDEPENDENT_AMBULATORY_CARE_PROVIDER_SITE_OTHER): Payer: BC Managed Care – PPO | Admitting: Nurse Practitioner

## 2020-04-28 VITALS — BP 148/100 | HR 92 | Temp 98.3°F | Ht 65.75 in | Wt 176.4 lb

## 2020-04-28 DIAGNOSIS — E559 Vitamin D deficiency, unspecified: Secondary | ICD-10-CM

## 2020-04-28 DIAGNOSIS — K900 Celiac disease: Secondary | ICD-10-CM

## 2020-04-28 DIAGNOSIS — M810 Age-related osteoporosis without current pathological fracture: Secondary | ICD-10-CM

## 2020-04-28 DIAGNOSIS — R748 Abnormal levels of other serum enzymes: Secondary | ICD-10-CM

## 2020-04-28 DIAGNOSIS — Z23 Encounter for immunization: Secondary | ICD-10-CM | POA: Insufficient documentation

## 2020-04-28 DIAGNOSIS — E785 Hyperlipidemia, unspecified: Secondary | ICD-10-CM | POA: Diagnosis not present

## 2020-04-28 DIAGNOSIS — E611 Iron deficiency: Secondary | ICD-10-CM

## 2020-04-28 DIAGNOSIS — E034 Atrophy of thyroid (acquired): Secondary | ICD-10-CM | POA: Diagnosis not present

## 2020-04-28 DIAGNOSIS — C50311 Malignant neoplasm of lower-inner quadrant of right female breast: Secondary | ICD-10-CM

## 2020-04-28 DIAGNOSIS — M8580 Other specified disorders of bone density and structure, unspecified site: Secondary | ICD-10-CM | POA: Insufficient documentation

## 2020-04-28 DIAGNOSIS — F3342 Major depressive disorder, recurrent, in full remission: Secondary | ICD-10-CM

## 2020-04-28 DIAGNOSIS — R7303 Prediabetes: Secondary | ICD-10-CM

## 2020-04-28 DIAGNOSIS — Z114 Encounter for screening for human immunodeficiency virus [HIV]: Secondary | ICD-10-CM

## 2020-04-28 DIAGNOSIS — Z13820 Encounter for screening for osteoporosis: Secondary | ICD-10-CM

## 2020-04-28 DIAGNOSIS — Z1211 Encounter for screening for malignant neoplasm of colon: Secondary | ICD-10-CM

## 2020-04-28 MED ORDER — FERROUS SULFATE 325 (65 FE) MG PO TBEC: DELAYED_RELEASE_TABLET | ORAL | 11 refills | Status: DC

## 2020-04-28 MED ORDER — PROCHLORPERAZINE MALEATE 5 MG PO TABS: 5.0000 mg | ORAL_TABLET | Freq: Four times a day (QID) | ORAL | 0 refills | Status: DC | PRN

## 2020-04-28 NOTE — Assessment & Plan Note (Signed)
Elevated liver enzymes in the setting of celiac disease. Discussed liver enzymes with the patient-these appear to be improving with a diet reduced in gluten. Discussed with the patient the importance of continuing a gluten-free diet. We will reevaluate labs in approximately 6 months.  Orders placed.

## 2020-04-28 NOTE — Assessment & Plan Note (Signed)
Patient has stopped taking Fosamax. At this time it does not look like she has had a DEXA scan in several years. Orders placed for DEXA scan today. Discussed with the patient will determine if Fosamax needs to be restarted based on these results. Did discuss decreased vitamin D levels and recommend 600 to 800iu of vitamin D supplementation daily.

## 2020-04-28 NOTE — Assessment & Plan Note (Signed)
Patient is overdue for colonoscopy. Orders placed for referral to GI for colonoscopy.

## 2020-04-28 NOTE — Assessment & Plan Note (Signed)
Discussed current lab results with the patient. Thyroid stable at this time. Continue medication at current dose. Plan to follow-up with labs in approximately 6 to 12 months.

## 2020-04-28 NOTE — Assessment & Plan Note (Signed)
Pneumococcal vaccine provided today.

## 2020-04-28 NOTE — Assessment & Plan Note (Signed)
Continued hyperlipidemia present on labs.  Discussed the significance of elevated cholesterol levels with the patient today. At this time she would like to try to improve these levels with diet, exercise, and natural supplementation. Discussed with the patient the option to trial these for the next 6 months and plan to follow-up with repeat labs at that time. Discussed with patient the possibility of starting atorvastatin 10 mg daily if conservative measures are not effective to help reduce her risk of cardiovascular complications. Follow-up with labs in 6 months-orders placed today.

## 2020-04-28 NOTE — Assessment & Plan Note (Signed)
History of breast cancer with double mastectomy and chemotherapy. She denies any changes to chest wall or breast tissue today.  She is followed by oncology for follow-up management. Continue to follow-up with oncology and will be happy to provide additional support as needed. No mammogram needed due to mastectomy.

## 2020-04-28 NOTE — Assessment & Plan Note (Signed)
Patient is overdue for DEXA scan today. Orders placed today for DEXA scan.  We will determine the need to restart Fosamax based on results. Recommend vitamin D 600 to 800 IU daily.

## 2020-04-28 NOTE — Assessment & Plan Note (Signed)
Iron deficiency without anemia discussed with the patient today. Plan to begin 325 mg of ferrous sulfate 3 days a week and will reevaluate labs in approximately 6 months. Also provided patient with information on food sources that are high in iron that may be helpful for this condition. Recommend follow-up if symptoms begin to appear including fatigue, weakness, shortness of breath.

## 2020-04-28 NOTE — Assessment & Plan Note (Signed)
Discussed elevated hemoglobin A1c with patient and her diagnosis of prediabetes. This has improved since her last check. Discussed with the patient dietary recommendations to help lower her blood glucose levels.  Information provided. Plan to follow-up in 1 year with hemoglobin A1c testing.  Or sooner if needed.

## 2020-04-28 NOTE — Assessment & Plan Note (Signed)
Discussed recent lab results with patient and dietary requirements. LFTs have improved with decreased gluten diet. Recommend avoiding all gluten products.

## 2020-04-28 NOTE — Patient Instructions (Addendum)
Your recent labs showed the following concerns:  Iron deficiency- the amount of hemoglobin (the part of the blood that carries oxygen) was good on your CBC, but your iron stores are showing low. Eventually, this will lead to decreased hemoglobin as these stores run out causing iron deficiency anemia.  I recommend starting an iron supplement of Ferrous Sulfate 325mg  three days a week.  You can pick the 3 days to take this, to avoid constipation, it is best to spread the dosing out.   High Cholesterol: Your good cholesterol (HDL) is a little lower than we would like and your bad cholesterol (LDL) is a little higher than we like. We would like to see your HDL to be greater than 50 and your LDL less than 70 and at least less than 100. Since your labs show you also have pre-diabetes and your blood pressure has been elevated at past visits, we need to be more aggressive on your cholesterol to help protect you from cardiovascular disease, heart attack, and stroke.  I recommend following a low fat, low carbohydrate, low cholesterol diet and starting a medication daily to help reduce your risks.  I recommend starting Atorvastatin 10mg  daily at bedtime.   If you would like to try natural cholesterol lowering treatments first with dietary changes, you can try garlic, green tea, and a niacin supplement. We can plan to recheck in 6 months. The orders are in and you can just come to the lab and we will call you with the results.    Pre-Diabetes: Your hemoglobin A1c shows [place] your average blood sugars over the past three months. Any levels above 5.6% indicate pre-diabetes. Your labs show your hemoglobin A1c is 5.7%. I feel with proper diet and exercise, we can manage this and help reduce your risk of developing diabetes and other complications.  I recommend you to follow a diet with low carbohydrates, fats, and cholesterol to help with your blood sugars.   Vitamin D Deficiency: Your vitamin D is still low. If you  have stopped taking a Vitamin D supplement, I recommend you restart this.  I recommend Vitamin D 600-800iu per day.  You can also increase your absorption of vitamin D from sunlight exposure.   Overall health starts with a healthy lifestyle. We recommend the following practices to help keep you in your best overall condition:  * Limit alcohol consumption to no more than 1 drink per day or less * Avoid cigarettes and other nicotine products, including second hand smoke * Get physical activity of a minimum 30 minutes a day for at least 5 days out of the week * Eat a balanced diet low in carbohydrates (sugars/starches), fats, and cholesterol * Drink at least 64 ounces of water a day (unless on water restricted diet) * Get at least 6-8 hours of sleep every night * Wear your seatbelt in the car   Health Maintenance After Age 2 After age 4, you are at a higher risk for certain long-term diseases and infections as well as injuries from falls. Falls are a major cause of broken bones and head injuries in people who are older than age 23. Getting regular preventive care can help to keep you healthy and well. Preventive care includes getting regular testing and making lifestyle changes as recommended by your health care provider. Talk with your health care provider about:  Which screenings and tests you should have. A screening is a test that checks for a disease when you have no  symptoms.  A diet and exercise plan that is right for you. What should I know about screenings and tests to prevent falls? Screening and testing are the best ways to find a health problem Artyom Stencel. Hally Colella diagnosis and treatment give you the best chance of managing medical conditions that are common after age 28. Certain conditions and lifestyle choices may make you more likely to have a fall. Your health care provider may recommend:  Regular vision checks. Poor vision and conditions such as cataracts can make you more likely to  have a fall. If you wear glasses, make sure to get your prescription updated if your vision changes.  Medicine review. Work with your health care provider to regularly review all of the medicines you are taking, including over-the-counter medicines. Ask your health care provider about any side effects that may make you more likely to have a fall. Tell your health care provider if any medicines that you take make you feel dizzy or sleepy.  Osteoporosis screening. Osteoporosis is a condition that causes the bones to get weaker. This can make the bones weak and cause them to break more easily.  Blood pressure screening. Blood pressure changes and medicines to control blood pressure can make you feel dizzy.  Strength and balance checks. Your health care provider may recommend certain tests to check your strength and balance while standing, walking, or changing positions.  Foot health exam. Foot pain and numbness, as well as not wearing proper footwear, can make you more likely to have a fall.  Depression screening. You may be more likely to have a fall if you have a fear of falling, feel emotionally low, or feel unable to do activities that you used to do.  Alcohol use screening. Using too much alcohol can affect your balance and may make you more likely to have a fall. What actions can I take to lower my risk of falls? General instructions  Talk with your health care provider about your risks for falling. Tell your health care provider if: ? You fall. Be sure to tell your health care provider about all falls, even ones that seem minor. ? You feel dizzy, sleepy, or off-balance.  Take over-the-counter and prescription medicines only as told by your health care provider. These include any supplements.  Eat a healthy diet and maintain a healthy weight. A healthy diet includes low-fat dairy products, low-fat (lean) meats, and fiber from whole grains, beans, and lots of fruits and vegetables. Home  safety  Remove any tripping hazards, such as rugs, cords, and clutter.  Install safety equipment such as grab bars in bathrooms and safety rails on stairs.  Keep rooms and walkways well-lit. Activity   Follow a regular exercise program to stay fit. This will help you maintain your balance. Ask your health care provider what types of exercise are appropriate for you.  If you need a cane or walker, use it as recommended by your health care provider.  Wear supportive shoes that have nonskid soles. Lifestyle  Do not drink alcohol if your health care provider tells you not to drink.  If you drink alcohol, limit how much you have: ? 0-1 drink a day for women. ? 0-2 drinks a day for men.  Be aware of how much alcohol is in your drink. In the [place] one drink equals one typical bottle of beer (12 oz), one-half glass of wine (5 oz), or one shot of hard liquor (1 oz).  Do not use any products  that contain nicotine or tobacco, such as cigarettes and e-cigarettes. If you need help quitting, ask your health care provider. Summary  Having a healthy lifestyle and getting preventive care can help to protect your health and wellness after age 16.  Screening and testing are the best way to find a health problem Anyjah Roundtree and help you avoid having a fall. Danish Ruffins diagnosis and treatment give you the best chance for managing medical conditions that are more common for people who are older than age 29.  Falls are a major cause of broken bones and head injuries in people who are older than age 28. Take precautions to prevent a fall at home.  Work with your health care provider to learn what changes you can make to improve your health and wellness and to prevent falls. This information is not intended to replace advice given to you by your health care provider. Make sure you discuss any questions you have with your health care provider. Document Revised: 10/23/2018 Document Reviewed: 05/15/2017 Elsevier  Patient Education  [place].   Cholesterol Content in Foods Cholesterol is a waxy, fat-like substance that helps to carry fat in the blood. The body needs cholesterol in small amounts, but too much cholesterol can cause damage to the arteries and heart. Most people should eat less than 200 milligrams (mg) of cholesterol a day. Foods with cholesterol  Cholesterol is found in animal-based foods, such as meat, seafood, and dairy. Generally, low-fat dairy and lean meats have less cholesterol than full-fat dairy and fatty meats. The milligrams of cholesterol per serving (mg per serving) of common cholesterol-containing foods are listed below. Meat and other proteins  Egg -- one large whole egg has 186 mg.  Veal shank -- 4 oz has 141 mg.  Lean ground [place] (93% lean) -- 4 oz has 118 mg.  Fat-trimmed lamb loin -- 4 oz has 106 mg.  Lean ground beef (90% lean) -- 4 oz has 100 mg.  Lobster -- 3.5 oz has 90 mg.  Pork loin chops -- 4 oz has 86 mg.  Canned salmon -- 3.5 oz has 83 mg.  Fat-trimmed beef top loin -- 4 oz has 78 mg.  Frankfurter -- 1 frank (3.5 oz) has 77 mg.  Crab -- 3.5 oz has 71 mg.  Roasted chicken without skin, white meat -- 4 oz has 66 mg.  Light bologna -- 2 oz has 45 mg.  Deli-cut [place] -- 2 oz has 31 mg.  Canned tuna -- 3.5 oz has 31 mg.  Berniece Salines -- 1 oz has 29 mg.  Oysters and mussels (raw) -- 3.5 oz has 25 mg.  Mackerel -- 1 oz has 22 mg.  Trout -- 1 oz has 20 mg.  Pork sausage -- 1 link (1 oz) has 17 mg.  Salmon -- 1 oz has 16 mg.  Tilapia -- 1 oz has 14 mg. Dairy  Soft-serve ice cream --  cup (4 oz) has 103 mg.  Whole-milk yogurt -- 1 cup (8 oz) has 29 mg.  Cheddar cheese -- 1 oz has 28 mg.  American cheese -- 1 oz has 28 mg.  Whole milk -- 1 cup (8 oz) has 23 mg.  2% milk -- 1 cup (8 oz) has 18 mg.  Cream cheese -- 1 tablespoon (Tbsp) has 15 mg.  Cottage cheese --  cup (4 oz) has 14 mg.  Low-fat (1%) milk -- 1 cup (8  oz) has 10 mg.  Sour cream -- 1 Tbsp  has 8.5 mg.  Low-fat yogurt -- 1 cup (8 oz) has 8 mg.  Nonfat Greek yogurt -- 1 cup (8 oz) has 7 mg.  Half-and-half cream -- 1 Tbsp has 5 mg. Fats and oils  Cod liver oil -- 1 tablespoon (Tbsp) has 82 mg.  Butter -- 1 Tbsp has 15 mg.  Lard -- 1 Tbsp has 14 mg.  Bacon grease -- 1 Tbsp has 14 mg.  Mayonnaise -- 1 Tbsp has 5-10 mg.  Margarine -- 1 Tbsp has 3-10 mg. Exact amounts of cholesterol in these foods may vary depending on specific ingredients and brands. Foods without cholesterol Most plant-based foods do not have cholesterol unless you combine them with a food that has cholesterol. Foods without cholesterol include:  Grains and cereals.  Vegetables.  Fruits.  Vegetable oils, such as olive, canola, and sunflower oil.  Legumes, such as peas, beans, and lentils.  Nuts and seeds.  Egg whites. Summary  The body needs cholesterol in small amounts, but too much cholesterol can cause damage to the arteries and heart.  Most people should eat less than 200 milligrams (mg) of cholesterol a day. This information is not intended to replace advice given to you by your health care provider. Make sure you discuss any questions you have with your health care provider. Document Revised: 06/14/2017 Document Reviewed: 02/26/2017 Elsevier Patient Education  [place].  Iron-Rich Diet  Iron is a mineral that helps your body to produce hemoglobin. Hemoglobin is a protein in red blood cells that carries oxygen to your body's tissues. Eating too little iron may cause you to feel weak and tired, and it can increase your risk of infection. Iron is naturally found in many foods, and many foods have iron added to them (iron-fortified foods). You may need to follow an iron-rich diet if you do not have enough iron in your body due to certain medical conditions. The amount of iron that you need each day depends on your age, your sex, and any  medical conditions you have. Follow instructions from your health care provider or a diet and nutrition specialist (dietitian) about how much iron you should eat each day. What are tips for following this plan? Reading food labels  Check food labels to see how many milligrams (mg) of iron are in each serving. Cooking  Cook foods in pots and pans that are made from iron.  Take these steps to make it easier for your body to absorb iron from certain foods: ? Soak beans overnight before cooking. ? Soak whole grains overnight and drain them before using. ? Ferment flours before baking, such as by using yeast in bread dough. Meal planning  When you eat foods that contain iron, you should eat them with foods that are high in vitamin C. These include oranges, peppers, tomatoes, potatoes, and mango. Vitamin C helps your body to absorb iron. General information  Take iron supplements only as told by your health care provider. An overdose of iron can be life-threatening. If you were prescribed iron supplements, take them with orange juice or a vitamin C supplement.  When you eat iron-fortified foods or take an iron supplement, you should also eat foods that naturally contain iron, such as meat, poultry, and fish. Eating naturally iron-rich foods helps your body to absorb the iron that is added to other foods or contained in a supplement.  Certain foods and drinks prevent your body from absorbing iron properly. Avoid eating these foods in the  same meal as iron-rich foods or with iron supplements. These foods include: ? Coffee, black tea, and red wine. ? Milk, dairy products, and foods that are high in calcium. ? Beans and soybeans. ? Whole grains. What foods should I eat? Fruits Prunes. Raisins. Eat fruits high in vitamin C, such as oranges, grapefruits, and strawberries, alongside iron-rich foods. Vegetables Spinach (cooked). Green peas. Broccoli. Fermented vegetables. Eat vegetables high in  vitamin C, such as leafy greens, potatoes, bell peppers, and tomatoes, alongside iron-rich foods. Grains Iron-fortified breakfast cereal. Iron-fortified whole-wheat bread. Enriched rice. Sprouted grains. Meats and other proteins Beef liver. Oysters. Beef. Shrimp. [place]. Chicken. [place]. Sardines. Chickpeas. Nuts. Tofu. Pumpkin seeds. Beverages Tomato juice. Fresh orange juice. Prune juice. Hibiscus tea. Fortified instant breakfast shakes. Sweets and desserts Blackstrap molasses. Seasonings and condiments Tahini. Fermented soy sauce. Other foods Wheat germ. The items listed above may not be a complete list of recommended foods and beverages. Contact a dietitian for more information. What foods should I avoid? Grains Whole grains. Bran cereal. Bran flour. Oats. Meats and other proteins Soybeans. Products made from soy protein. Black beans. Lentils. Mung beans. Split peas. Dairy Milk. Cream. Cheese. Yogurt. Cottage cheese. Beverages Coffee. Black tea. Red wine. Sweets and desserts Cocoa. Chocolate. Ice cream. Other foods Basil. Oregano. Large amounts of parsley. The items listed above may not be a complete list of foods and beverages to avoid. Contact a dietitian for more information. Summary  Iron is a mineral that helps your body to produce hemoglobin. Hemoglobin is a protein in red blood cells that carries oxygen to your body's tissues.  Iron is naturally found in many foods, and many foods have iron added to them (iron-fortified foods).  When you eat foods that contain iron, you should eat them with foods that are high in vitamin C. Vitamin C helps your body to absorb iron.  Certain foods and drinks prevent your body from absorbing iron properly, such as whole grains and dairy products. You should avoid eating these foods in the same meal as iron-rich foods or with iron supplements. This information is not intended to replace advice given to you by your health care provider. Make  sure you discuss any questions you have with your health care provider. Document Revised: 06/14/2017 Document Reviewed: 05/28/2017 Elsevier Patient Education  2020 [place] for Diabetes Mellitus, Adult  Carbohydrate counting is a method of keeping track of how many carbohydrates you eat. Eating carbohydrates naturally increases the amount of sugar (glucose) in the blood. Counting how many carbohydrates you eat helps keep your blood glucose within normal limits, which helps you manage your diabetes (diabetes mellitus). It is important to know how many carbohydrates you can safely have in each meal. This is different for every person. A diet and nutrition specialist (registered dietitian) can help you make a meal plan and calculate how many carbohydrates you should have at each meal and snack. Carbohydrates are found in the following foods:  Grains, such as breads and cereals.  Dried beans and soy products.  Starchy vegetables, such as potatoes, peas, and corn.  Fruit and fruit juices.  Milk and yogurt.  Sweets and snack foods, such as cake, cookies, candy, chips, and soft drinks. How do I count carbohydrates? There are two ways to count carbohydrates in food. You can use either of the methods or a combination of both. Reading "Nutrition Facts" on packaged food The "Nutrition Facts" list is included on the labels of almost  all packaged foods and beverages in the [place] It includes:  The serving size.  Information about nutrients in each serving, including the grams (g) of carbohydrate per serving. To use the "Nutrition Facts":  Decide how many servings you will have.  Multiply the number of servings by the number of carbohydrates per serving.  The resulting number is the total amount of carbohydrates that you will be having. Learning standard serving sizes of other foods When you eat carbohydrate foods that are not packaged or do not include "Nutrition  Facts" on the label, you need to measure the servings in order to count the amount of carbohydrates:  Measure the foods that you will eat with a food scale or measuring cup, if needed.  Decide how many standard-size servings you will eat.  Multiply the number of servings by 15. Most carbohydrate-rich foods have about 15 g of carbohydrates per serving. ? For example, if you eat 8 oz (170 g) of strawberries, you will have eaten 2 servings and 30 g of carbohydrates (2 servings x 15 g = 30 g).  For foods that have more than one food mixed, such as soups and casseroles, you must count the carbohydrates in each food that is included. The following list contains standard serving sizes of common carbohydrate-rich foods. Each of these servings has about 15 g of carbohydrates:   hamburger bun or  English muffin.   oz (15 mL) syrup.   oz (14 g) jelly.  1 slice of bread.  1 six-inch tortilla.  3 oz (85 g) cooked rice or pasta.  4 oz (113 g) cooked dried beans.  4 oz (113 g) starchy vegetable, such as peas, corn, or potatoes.  4 oz (113 g) hot cereal.  4 oz (113 g) mashed potatoes or  of a large baked potato.  4 oz (113 g) canned or frozen fruit.  4 oz (120 mL) fruit juice.  4-6 crackers.  6 chicken nuggets.  6 oz (170 g) unsweetened dry cereal.  6 oz (170 g) plain fat-free yogurt or yogurt sweetened with artificial sweeteners.  8 oz (240 mL) milk.  8 oz (170 g) fresh fruit or one small piece of fruit.  24 oz (680 g) popped popcorn. Example of carbohydrate counting Sample meal  3 oz (85 g) chicken breast.  6 oz (170 g) brown rice.  4 oz (113 g) corn.  8 oz (240 mL) milk.  8 oz (170 g) strawberries with sugar-free whipped topping. Carbohydrate calculation 1. Identify the foods that contain carbohydrates: ? Rice. ? Corn. ? Milk. ? Strawberries. 2. Calculate how many servings you have of each food: ? 2 servings rice. ? 1 serving corn. ? 1 serving milk. ? 1  serving strawberries. 3. Multiply each number of servings by 15 g: ? 2 servings rice x 15 g = 30 g. ? 1 serving corn x 15 g = 15 g. ? 1 serving milk x 15 g = 15 g. ? 1 serving strawberries x 15 g = 15 g. 4. Add together all of the amounts to find the total grams of carbohydrates eaten: ? 30 g + 15 g + 15 g + 15 g = 75 g of carbohydrates total. Summary  Carbohydrate counting is a method of keeping track of how many carbohydrates you eat.  Eating carbohydrates naturally increases the amount of sugar (glucose) in the blood.  Counting how many carbohydrates you eat helps keep your blood glucose within normal limits, which helps you manage  your diabetes.  A diet and nutrition specialist (registered dietitian) can help you make a meal plan and calculate how many carbohydrates you should have at each meal and snack. This information is not intended to replace advice given to you by your health care provider. Make sure you discuss any questions you have with your health care provider. Document Revised: 01/24/2017 Document Reviewed: 12/14/2015 Elsevier Patient Education  [place].  Influenza (Flu) Vaccine (Inactivated or Recombinant): What You Need to Know 1. Why get vaccinated? Influenza vaccine can prevent influenza (flu). Flu is a contagious disease that spreads around the [place] every year, usually between October and May. Anyone can get the flu, but it is more dangerous for some people. Infants and young children, people 63 years of age and older, pregnant women, and people with certain health conditions or a weakened immune system are at greatest risk of flu complications. Pneumonia, bronchitis, sinus infections and ear infections are examples of flu-related complications. If you have a medical condition, such as heart disease, cancer or diabetes, flu can make it worse. Flu can cause fever and chills, sore throat, muscle aches, fatigue, cough, headache, and runny or stuffy  nose. Some people may have vomiting and diarrhea, though this is more common in children than adults. Each year thousands of people in the [place] die from flu, and many more are hospitalized. Flu vaccine prevents millions of illnesses and flu-related visits to the doctor each year. 2. Influenza vaccine CDC recommends everyone 39 months of age and older get vaccinated every flu season. Children 6 months through 21 years of age may need 2 doses during a single flu season. Everyone else needs only 1 dose each flu season. It takes about 2 weeks for protection to develop after vaccination. There are many flu viruses, and they are always changing. Each year a new flu vaccine is made to protect against three or four viruses that are likely to cause disease in the upcoming flu season. Even when the vaccine doesn't exactly match these viruses, it may still provide some protection. Influenza vaccine does not cause flu. Influenza vaccine may be given at the same time as other vaccines. 3. Talk with your health care provider Tell your vaccine provider if the person getting the vaccine:  Has had an allergic reaction after a previous dose of influenza vaccine, or has any severe, life-threatening allergies.  Has ever had Guillain-Barr Syndrome (also called GBS). In some cases, your health care provider may decide to postpone influenza vaccination to a future visit. People with minor illnesses, such as a cold, may be vaccinated. People who are moderately or severely ill should usually wait until they recover before getting influenza vaccine. Your health care provider can give you more information. 4. Risks of a vaccine reaction  Soreness, redness, and swelling where shot is given, fever, muscle aches, and headache can happen after influenza vaccine.  There may be a very small increased risk of Guillain-Barr Syndrome (GBS) after inactivated influenza vaccine (the flu shot). Young children who get the flu  shot along with pneumococcal vaccine (PCV13), and/or DTaP vaccine at the same time might be slightly more likely to have a seizure caused by fever. Tell your health care provider if a child who is getting flu vaccine has ever had a seizure. People sometimes faint after medical procedures, including vaccination. Tell your provider if you feel dizzy or have vision changes or ringing in the ears. As with any medicine, there is a  very remote chance of a vaccine causing a severe allergic reaction, other serious injury, or death. 5. What if there is a serious problem? An allergic reaction could occur after the vaccinated person leaves the clinic. If you see signs of a severe allergic reaction (hives, swelling of the face and throat, difficulty breathing, a fast heartbeat, dizziness, or weakness), call [id number] and get the person to the nearest hospital. For other signs that concern you, call your health care provider. Adverse reactions should be reported to the Vaccine Adverse Event Reporting System (VAERS). Your health care provider will usually file this report, or you can do it yourself. Visit the VAERS website at www.vaers.SamedayNews.es or call 9798147729.VAERS is only for reporting reactions, and VAERS staff do not give medical advice. 6. The National Vaccine Injury Compensation Program The [employer] Vaccine Injury Compensation Program (VICP) is a federal program that was created to compensate people who may have been injured by certain vaccines. Visit the VICP website at GoldCloset.com.ee or call (913)338-4837 to learn about the program and about filing a claim. There is a time limit to file a claim for compensation. 7. How can I learn more?  Ask your healthcare provider.  Call your local or state health department.  Contact the Centers for Disease Control and Prevention (CDC): ? Call (671)597-0494 (1-800-CDC-INFO) or ? Visit CDC's https://gibson.com/ Vaccine Information Statement (Interim)  Inactivated Influenza Vaccine (02/27/2018) This information is not intended to replace advice given to you by your health care provider. Make sure you discuss any questions you have with your health care provider. Document Revised: 10/21/2018 Document Reviewed: 03/03/2018 Elsevier Patient Education  [place].  Pneumococcal Conjugate Vaccine (PCV13): What You Need to Know 1. Why get vaccinated? Pneumococcal conjugate vaccine (PCV13) can prevent pneumococcal disease. Pneumococcal disease refers to any illness caused by pneumococcal bacteria. These bacteria can cause many types of illnesses, including pneumonia, which is an infection of the lungs. Pneumococcal bacteria are one of the most common causes of pneumonia. Besides pneumonia, pneumococcal bacteria can also cause:  Ear infections  Sinus infections  Meningitis (infection of the tissue covering the brain and spinal cord)  Bacteremia (bloodstream infection) Anyone can get pneumococcal disease, but children under 70 years of age, people with certain medical conditions, adults 89 years or older, and cigarette smokers are at the highest risk. Most pneumococcal infections are mild. However, some can result in long-term problems, such as brain damage or hearing loss. Meningitis, bacteremia, and pneumonia caused by pneumococcal disease can be fatal. 2. PCV13 PCV13 protects against 13 types of bacteria that cause pneumococcal disease. Infants and young children usually need 4 doses of pneumococcal conjugate vaccine, at 2, 4, 6, and 48-16 months of age. In some cases, a child might need fewer than 4 doses to complete PCV13 vaccination. A dose of PCV23 vaccine is also recommended for anyone 2 years or older with certain medical conditions if they did not already receive PCV13. This vaccine may be given to adults 43 years or older based on discussions between the patient and health care provider. 3. Talk with your health care provider Tell  your vaccine provider if the person getting the vaccine:  Has had an allergic reaction after a previous dose of PCV13, to an earlier pneumococcal conjugate vaccine known as PCV7, or to any vaccine containing diphtheria toxoid (for example, DTaP), or has any severe, life-threatening allergies.  In some cases, your health care provider may decide to postpone PCV13 vaccination to a future visit. People  with minor illnesses, such as a cold, may be vaccinated. People who are moderately or severely ill should usually wait until they recover before getting PCV13. Your health care provider can give you more information. 4. Risks of a vaccine reaction  Redness, swelling, pain, or tenderness where the shot is given, and fever, loss of appetite, fussiness (irritability), feeling tired, headache, and chills can happen after PCV13. Young children may be at increased risk for seizures caused by fever after PCV13 if it is administered at the same time as inactivated influenza vaccine. Ask your health care provider for more information. People sometimes faint after medical procedures, including vaccination. Tell your provider if you feel dizzy or have vision changes or ringing in the ears. As with any medicine, there is a very remote chance of a vaccine causing a severe allergic reaction, other serious injury, or death. 5. What if there is a serious problem? An allergic reaction could occur after the vaccinated person leaves the clinic. If you see signs of a severe allergic reaction (hives, swelling of the face and throat, difficulty breathing, a fast heartbeat, dizziness, or weakness), call 9-1-1 and get the person to the nearest hospital. For other signs that concern you, call your health care provider. Adverse reactions should be reported to the Vaccine Adverse Event Reporting System (VAERS). Your health care provider will usually file this report, or you can do it yourself. Visit the VAERS website at  www.vaers.SamedayNews.es or call 279-287-3987. VAERS is only for reporting reactions, and VAERS staff do not give medical advice. 6. The National Vaccine Injury Compensation Program The [employer] Vaccine Injury Compensation Program (VICP) is a federal program that was created to compensate people who may have been injured by certain vaccines. Visit the VICP website at GoldCloset.com.ee or call (707) 088-0980 to learn about the program and about filing a claim. There is a time limit to file a claim for compensation. 7. How can I learn more?  Ask your health care provider.  Call your local or state health department.  Contact the Centers for Disease Control and Prevention (CDC): ? Call 225-151-4938 (1-800-CDC-INFO) or ? Visit CDC's website at http://hunter.com/ Vaccine Information Statement PCV13 Vaccine (05/14/2018) This information is not intended to replace advice given to you by your health care provider. Make sure you discuss any questions you have with your health care provider. Document Revised: 10/21/2018 Document Reviewed: 02/11/2018 Elsevier Patient Education  [place].

## 2020-04-28 NOTE — Assessment & Plan Note (Signed)
Flu vaccine provided today.  

## 2020-04-28 NOTE — Assessment & Plan Note (Signed)
Discussed laboratory results revealing vitamin D deficiency with patient. At this time she has stopped taking her vitamin D supplementation. Recommend 600 to 800 IU vitamin D daily. Prescription provided today.

## 2020-04-28 NOTE — Progress Notes (Signed)
Comprehensive Physical Exam-- Annual  BP (!) 148/100   Pulse 92   Temp 98.3 F (36.8 C) (Oral)   Ht 5' 5.75" (1.67 m)   Wt 176 lb 6.4 oz (80 kg)   LMP 09/11/2012   SpO2 94%   BMI 28.69 kg/m    Subjective:    Patient ID: Nancy Beard, female    DOB: 28-Sep-1955, 64 y.o.   MRN: [id number]  HPI: Nancy Beard is a 64 y.o. female presenting on 04/28/2020 for comprehensive medical examination.   Current medical problems include:  Patient Active Problem List   Diagnosis Date Noted  . Screening for HIV (human immunodeficiency virus) 04/28/2020  . Need for vaccination with 13-polyvalent pneumococcal conjugate vaccine 04/28/2020  . Need for influenza vaccination 04/28/2020  . Screening for colon cancer 04/28/2020  . Screening for osteoporosis 04/28/2020  . Elevated liver enzymes 11/12/2018  . Prediabetes 11/12/2018  . History of high cholesterol 11/12/2018  . Herpes simplex 04/12/2017  . Abnormal transaminases 06/27/2015  . Iron deficiency 06/24/2015  . Vitamin D deficiency 06/20/2015  . Genital herpes 09/06/2014  . Neuropathy 09/06/2014  . Anticoagulant long-term use 05/01/2014  . Persistent headaches 02/22/2014  . Hot flashes related to aromatase inhibitor therapy 12/10/2013  . Vulvar intraepithelial neoplasia III (VIN III) 06/30/2013  . Status post bilateral breast reconstruction 06/30/2013  . Osteoporosis 05/13/2013  . Acquired absence of bilateral breasts and nipples 09/23/2012  . Primary cancer of lower-inner quadrant of right female breast ([place]) 06/24/2012  . Hyperlipidemia 05/13/2008  . Hypothyroidism 01/31/2007  . Depression 01/31/2007  . Celiac disease/sprue 01/31/2007   She currently lives with: Husband, Karn Pickler Interim Problems from her last visit: no   She reports regular vision exams q1-5y: yes She reports regular dental exams q 39m: yes Her diet consists of: Reports a fairly healthy diet low in sweets and soft drinks. She is working to increase her water  consumption and getting on an exercise regimen. She reports that she is not consistent with following her diet regimen for celiac disease, but  She endorses exercise and/or activity of: playing with her 59 year old grandson She works at: [employer]- she was working from home for the past year, but has been back in the office since September.   She denies ETOH use  She denies nictoine use  She denies illegal substance use    She reports no menstrual periods Current menopausal symptoms: no She is currently sexually active with 1 partners. She denies concerns today about STI Contraception choices are: post-menopausal  She denies concerns about skin changes today She denies concerns about bowel changes today She denies concerns about bladder changes today  Depression Screen done today and results listed below:  Depression screen [facility] 2/9 04/28/2020 11/12/2018 08/27/2018 04/12/2017  Decreased Interest 0 0 0 1  Down, Depressed, Hopeless 0 0 0 1  PHQ - 2 Score 0 0 0 2  Altered sleeping - - 1 1  Tired, decreased energy - - 1 1  Change in appetite - - 0 1  Feeling bad or failure about yourself  - - 0 0  Trouble concentrating - - 1 0  Moving slowly or fidgety/restless - - 0 0  Suicidal thoughts - - 0 -  PHQ-9 Score - - 3 5  Difficult doing work/chores - - Somewhat difficult -  Some recent data might be hidden    She does not have a history of falls. I did not complete a risk  assessment for falls. A plan of care for falls was not documented.   Past Medical History:  Past Medical History:  Diagnosis Date  . Breast cancer ([place])    right  . Celiac disease   . Depression   . DIVERTICULOSIS, COLON 05/05/2007  . DVT (deep venous thrombosis) ([place]) 2014   Right lower extremity, thigh  . DVT (deep venous thrombosis) ([place]) 06/30/2013   August 2014. Continued Xarelto due to persistent chronic DVT R popliteal vein. Plan repeat per oncology March 2016   . Fracture of left tibial plateau  06/15/2015  . Fracture tibia/fibula 06/14/2015  . HEMORRHOIDS, EXTERNAL 05/05/2007  . History of chemotherapy   . Hypothyroidism   . Neuropathy    of fingers and toes  . Pneumonia    hx of years ago  . Thyroid disease   . Vitamin D insufficiency 06/20/2015  . Wears glasses     Surgical History:  Past Surgical History:  Procedure Laterality Date  . AXILLARY SENTINEL NODE BIOPSY Right 09/11/2012   Procedure: AXILLARY SENTINEL lymph NODE  BIOPSY;  Surgeon: Odis Hollingshead, MD;  Location: [facility];  Service: General;  Laterality: Right;  right nuclear medicine injection 12:30   . BREAST LUMPECTOMY     benign lump  . BREAST RECONSTRUCTION Right 09/24/2013   Procedure: REVISION OF RIGHT BREAST RECONSTRUCTION WITH REPOSITIONING RIGHT IMPLANT, POSSIBLE EXCISION CAPSULAR CONTRACTURE AND LIPOFILLING FOR FAT GRAFTING;  Surgeon: Theodoro Kos, DO;  Location: [facility];  Service: Plastics;  Laterality: Right;  . BREAST RECONSTRUCTION WITH PLACEMENT OF TISSUE EXPANDER AND FLEX HD (ACELLULAR HYDRATED DERMIS) Bilateral 09/11/2012   Procedure: BREAST RECONSTRUCTION WITH PLACEMENT OF TISSUE EXPANDER AND FLEX HD (ACELLULAR HYDRATED DERMIS) ADM;  Surgeon: Theodoro Kos, DO;  Location: [place];  Service: Plastics;  Laterality: Bilateral;  . COLONOSCOPY  2006  . cryoblation of cervix     long time ago  . ESOPHAGOGASTRODUODENOSCOPY  2007   sprue  . EXTERNAL FIXATION LEG Left 06/15/2015   Procedure: EXTERNAL FIXATION LEG;  Surgeon: Altamese [place], MD;  Location: [place];  Service: Orthopedics;  Laterality: Left;  . INCISION AND DRAINAGE OF WOUND Left 09/16/2012   Procedure: Left Breast Evacuation of Hematoma;  Surgeon: Theodoro Kos, DO;  Location: [place];  Service: Plastics;  Laterality: Left;  . LIPOSUCTION WITH LIPOFILLING Bilateral 09/24/2013   Procedure: LIPOSUCTION WITH LIPOFILLING;  Surgeon: Theodoro Kos, DO;  Location: [facility];  Service: Plastics;  Laterality: Bilateral;   Biltalteal filling breast  . ORIF TIBIA PLATEAU Left 06/21/2015   Procedure: OPEN REDUCTION INTERNAL FIXATION (ORIF) TIBIAL PLATEAU REMOVAL OF EXTERNAL FISTULA;  Surgeon: Altamese Radcliff, MD;  Location: [facility];  Service: Orthopedics;  Laterality: Left;  . PORT-A-CATH REMOVAL Right 08/07/2013   Procedure: REMOVAL PORT-A-CATH;  Surgeon: Odis Hollingshead, MD;  Location: [facility];  Service: General;  Laterality: Right;  . PORTACATH PLACEMENT Right 10/09/2012   Procedure: US GUIDED INSERTION PORT-A-CATH;  Surgeon: Odis Hollingshead, MD;  Location: [facility];  Service: General;  Laterality: Right;  Right Subclavian Vein  . REMOVAL OF BILATERAL TISSUE EXPANDERS WITH PLACEMENT OF BILATERAL BREAST IMPLANTS Bilateral 06/24/2013   Procedure: REMOVAL OF BILATERAL TISSUE EXPANDERS WITH PLACEMENT OF BILATERAL BREAST IMPLANTS;  Surgeon: Theodoro Kos, DO;  Location: [place] [place];  Service: Plastics;  Laterality: Bilateral;  . TOTAL MASTECTOMY Bilateral 09/11/2012   Procedure: bilateral MASTECTOMY;  Surgeon: Odis Hollingshead, MD;  Location: [facility];  Service:  General;  Laterality: Bilateral;  . VULVECTOMY N/A 07/14/2013   Procedure: WIDE LOCAL  EXCISION VULVAR;  Surgeon: Alvino Chapel, MD;  Location: [facility];  Service: Gynecology;  Laterality: N/A;    Medications:  Current Outpatient Medications on File Prior to Visit  Medication Sig  . alendronate (FOSAMAX) 70 MG tablet Take 1 tablet (70 mg total) by mouth once a week. Take with a full glass of water on an empty stomach.  . AMBULATORY NON FORMULARY MEDICATION BLOOD PRESSURE MONITOR - ARM CUFF PER PATIENT PREFERENCE / INSURANCE COVERAGE, DX: ELEVATED BLOOD PRESSURE  . Cholecalciferol (VITAMIN D-3 PO) Take 1 tablet by mouth daily.   [name] levothyroxine (SYNTHROID) 112 MCG tablet Take 1 tablet (112 mcg total) by mouth daily before breakfast.  . Multiple Vitamin (MULTIVITAMIN) tablet Take 1 tablet by mouth daily.  [name]  tobramycin (TOBREX) 0.3 % ophthalmic solution Place 1 drop into both eyes 2 (two) times daily.  . valACYclovir (VALTREX) 1000 MG tablet Take 2 tablets by mouth 2 (two) times daily as needed (OUTBREAK).   [name] venlafaxine XR (EFFEXOR-XR) 150 MG 24 hr capsule Take 1 capsule (150 mg total) by mouth daily with breakfast.   No current facility-administered medications on file prior to visit.    Allergies:  Allergies  Allergen Reactions  . Ciprofloxacin Itching and Other (See Comments)    Patient states she had muscle spasms  . Macrobid [Nitrofurantoin] Hives and Other (See Comments)    Significant transaminitis  . Dilaudid [Hydromorphone Hcl] Itching  . Gluten Meal Other (See Comments)    Social History:  Social History   Socioeconomic History  . Marital status: Married    Spouse name: Not on file  . Number of children: Not on file  . Years of education: Not on file  . Highest education level: Not on file  Occupational History  . Occupation: Conservation officer, historic buildings: [employer]  Tobacco Use  . Smoking status: Never Smoker  . Smokeless tobacco: Never Used  Vaping Use  . Vaping Use: Never used  Substance and Sexual Activity  . Alcohol use: No  . Drug use: No  . Sexual activity: Yes    Partners: Male    Birth control/protection: Post-menopausal  Other Topics Concern  . Not on file  Social History Narrative   Married. 1 son- 1 grandson '24 lives in [place].       Works for [employer] full time.       Hobbies: reading/knitting/crochet/outside in fresh air   Social Determinants of Health   Financial Resource Strain:   . Difficulty of Paying Living Expenses: Not on file  Food Insecurity:   . Worried About Charity fundraiser in the Last Year: Not on file  . Ran Out of Food in the Last Year: Not on file  Transportation Needs:   . Lack of Transportation (Medical): Not on file  . Lack of Transportation (Non-Medical): Not on file  Physical Activity:   . Days of  Exercise per Week: Not on file  . Minutes of Exercise per Session: Not on file  Stress:   . Feeling of Stress : Not on file  Social Connections:   . Frequency of Communication with Friends and Family: Not on file  . Frequency of Social Gatherings with Friends and Family: Not on file  . Attends Religious Services: Not on file  . Active Member of Clubs or Organizations: Not on file  . Attends Archivist  Meetings: Not on file  . Marital Status: Not on file  Intimate Partner Violence:   . Fear of Current or Ex-Partner: Not on file  . Emotionally Abused: Not on file  . Physically Abused: Not on file  . Sexually Abused: Not on file   Social History   Tobacco Use  Smoking Status Never Smoker  Smokeless Tobacco Never Used   Social History   Substance and Sexual Activity  Alcohol Use No    Family History:  Family History  Problem Relation Age of Onset  . Breast cancer Paternal Aunt        dx in her 93s  . Lymphoma Paternal Uncle   . Breast cancer Paternal Grandmother        dx >50  . Heart attack Paternal Grandfather   . Breast cancer Other        2 maternal great aunts with breast cancer >50  . Osteoporosis Mother        controlled with diet/exercise  . COPD Father     Past medical history, surgical history, medications, allergies, family history and social history reviewed with patient today and changes made to appropriate areas of the chart.   All ROS negative except what is listed above and in the HPI.      Objective:    BP (!) 148/100   Pulse 92   Temp 98.3 F (36.8 C) (Oral)   Ht 5' 5.75" (1.67 m)   Wt 176 lb 6.4 oz (80 kg)   LMP 09/11/2012   SpO2 94%   BMI 28.69 kg/m   Wt Readings from Last 3 Encounters:  04/28/20 176 lb 6.4 oz (80 kg)  02/22/20 179 lb (81.2 kg)  05/05/19 171 lb (77.6 kg)    Physical Exam Exam conducted with a chaperone present.  Constitutional:      General: She is not in acute distress.    Appearance: Normal appearance.  She is normal weight.  HENT:     Head: Normocephalic and atraumatic.     Right Ear: Hearing, tympanic membrane, ear canal and external ear normal.     Left Ear: Hearing, tympanic membrane, ear canal and external ear normal.     Nose: Nose normal. No mucosal edema, congestion or rhinorrhea.     Right Sinus: No maxillary sinus tenderness or frontal sinus tenderness.     Left Sinus: No maxillary sinus tenderness or frontal sinus tenderness.     Mouth/Throat:     Lips: Pink.     Mouth: Mucous membranes are moist.     Tongue: No lesions.     Pharynx: Oropharynx is clear. Uvula midline. No oropharyngeal exudate or posterior oropharyngeal erythema.     Tonsils: No tonsillar exudate or tonsillar abscesses.  Eyes:     General: Lids are normal. Vision grossly intact.     Extraocular Movements: Extraocular movements intact.     Conjunctiva/sclera: Conjunctivae normal.     Pupils: Pupils are equal, round, and reactive to light.  Neck:     Thyroid: No thyroid mass, thyromegaly or thyroid tenderness.     Vascular: No carotid bruit.     Trachea: Trachea normal.  Cardiovascular:     Rate and Rhythm: Normal rate and regular rhythm.     Chest Wall: PMI is not displaced.     Pulses: Normal pulses.     Heart sounds: Normal heart sounds. No murmur heard.   Pulmonary:     Effort: Pulmonary effort is normal. No respiratory  distress.     Breath sounds: Normal breath sounds. No rhonchi.  Chest:     Chest wall: No mass, deformity or tenderness.     Breasts: Breasts are symmetrical.        Right: Normal. No mass, skin change or tenderness.        Left: Normal. No mass, skin change or tenderness.  Abdominal:     General: Abdomen is flat. Bowel sounds are normal. There is no distension.     Palpations: Abdomen is soft. There is no hepatomegaly, splenomegaly or mass.     Tenderness: There is no abdominal tenderness. There is no right CVA tenderness, left CVA tenderness, guarding or rebound.     Hernia: No  hernia is present.  Genitourinary:    General: Normal vulva.     Exam position: Lithotomy position.     Pubic Area: No rash or pubic lice.      Labia:        Right: No tenderness, lesion or injury.        Left: No tenderness, lesion or injury.      Urethra: No prolapse or urethral swelling.     Vagina: Normal. No vaginal discharge, tenderness or lesions.     Cervix: No cervical motion tenderness, discharge, friability or lesion.     Uterus: Normal. Not tender.      Adnexa: Right adnexa normal and left adnexa normal.       Right: No mass or tenderness.         Left: No mass or tenderness.       Rectum: Normal.  Musculoskeletal:        General: No swelling, tenderness or signs of injury. Normal range of motion.     Cervical back: Normal, full passive range of motion without pain and normal range of motion.     Thoracic back: Normal.     Lumbar back: Normal.     Right lower leg: No edema.     Left lower leg: No edema.  Feet:     Right foot:     Skin integrity: Skin integrity normal.     Toenail Condition: Right toenails are normal.     Left foot:     Skin integrity: Skin integrity normal.     Toenail Condition: Left toenails are normal.  Lymphadenopathy:     Cervical: No cervical adenopathy.     Upper Body:     Right upper body: No supraclavicular, axillary or pectoral adenopathy.     Left upper body: No supraclavicular, axillary or pectoral adenopathy.     Lower Body: No right inguinal adenopathy. No left inguinal adenopathy.  Skin:    General: Skin is warm and dry.     Capillary Refill: Capillary refill takes less than 2 seconds.  Neurological:     General: No focal deficit present.     Mental Status: She is alert and oriented to person, place, and time.     Sensory: No sensory deficit.     Motor: No weakness.     Gait: Gait normal.     Deep Tendon Reflexes: Reflexes are normal and symmetric.  Psychiatric:        Attention and Perception: Attention normal.        Mood  and Affect: Mood normal.        Behavior: Behavior normal. Behavior is cooperative.        Cognition and Memory: Cognition and memory normal.     Results  for orders placed or performed in visit on 02/22/20  Tissue transglutaminase, IgA  Result Value Ref Range   (tTG) Ab, IgA >100 (H) U/mL  Tissue transglutaminase, IgG  Result Value Ref Range   (tTG) Ab, IgG 25 (H) U/mL  CBC  Result Value Ref Range   WBC 5.7 3.8 - 10.8 Thousand/uL   RBC 4.43 3.80 - 5.10 Million/uL   Hemoglobin 12.7 11.7 - 15.5 g/dL   HCT 37.9 35 - 45 %   MCV 85.6 80.0 - 100.0 fL   MCH 28.7 27.0 - 33.0 pg   MCHC 33.5 32.0 - 36.0 g/dL   RDW 13.7 11.0 - 15.0 %   Platelets 331 140 - 400 Thousand/uL   MPV 11.6 7.5 - 12.5 fL  COMPLETE METABOLIC PANEL WITH GFR  Result Value Ref Range   Glucose, Bld 98 65 - 99 mg/dL   BUN 10 7 - 25 mg/dL   Creat 0.65 0.50 - 0.99 mg/dL   GFR, Est Non African American 93 > OR = 60 mL/min/1.70m2   GFR, Est African American 108 > OR = 60 mL/min/1.17m2   BUN/Creatinine Ratio NOT APPLICABLE 6 - 22 (calc)   Sodium 140 135 - 146 mmol/L   Potassium 4.6 3.5 - 5.3 mmol/L   Chloride 103 98 - 110 mmol/L   CO2 27 20 - 32 mmol/L   Calcium 9.1 8.6 - 10.4 mg/dL   Total Protein 6.9 6.1 - 8.1 g/dL   Albumin 4.3 3.6 - 5.1 g/dL   Globulin 2.6 1.9 - 3.7 g/dL (calc)   AG Ratio 1.7 1.0 - 2.5 (calc)   Total Bilirubin 0.4 0.2 - 1.2 mg/dL   Alkaline phosphatase (APISO) 160 (H) 37 - 153 U/L   AST 42 (H) 10 - 35 U/L   ALT 52 (H) 6 - 29 U/L  Lipid panel  Result Value Ref Range   Cholesterol 225 (H) <200 mg/dL   HDL 46 (L) > OR = 50 mg/dL   Triglycerides 144 <150 mg/dL   LDL Cholesterol (Calc) 152 (H) mg/dL (calc)   Total CHOL/HDL Ratio 4.9 <5.0 (calc)   Non-HDL Cholesterol (Calc) 179 (H) <130 mg/dL (calc)  TSH  Result Value Ref Range   TSH 1.66 0.40 - 4.50 mIU/L  Hemoglobin A1c  Result Value Ref Range   Hgb A1c MFr Bld 5.7 (H) <5.7 % of total Hgb   Mean Plasma Glucose 117 (calc)   eAG (mmol/L)  6.5 (calc)  VITAMIN D 25 Hydroxy (Vit-D Deficiency, Fractures)  Result Value Ref Range   Vit D, 25-Hydroxy 17 (L) 30 - 100 ng/mL  Vitamin B12  Result Value Ref Range   Vitamin B-12 238 200 - 1,100 pg/mL  Fe+TIBC+Fer  Result Value Ref Range   Iron 39 (L) 45 - 160 mcg/dL   TIBC 482 (H) 250 - 450 mcg/dL (calc)   %SAT 8 (L) 16 - 45 % (calc)   Ferritin 7 (L) 16 - 288 ng/mL      Assessment & Plan:   Problem List Items Addressed This Visit      Digestive   Celiac disease/sprue - Primary    Discussed recent lab results with patient and dietary requirements. LFTs have improved with decreased gluten diet. Recommend avoiding all gluten products.        Endocrine   Hypothyroidism    Discussed current lab results with the patient. Thyroid stable at this time. Continue medication at current dose. Plan to follow-up with labs in approximately 6 to  12 months.        Musculoskeletal and Integument   Osteoporosis    Patient has stopped taking Fosamax. At this time it does not look like she has had a DEXA scan in several years. Orders placed for DEXA scan today. Discussed with the patient will determine if Fosamax needs to be restarted based on these results. Did discuss decreased vitamin D levels and recommend 600 to 800iu of vitamin D supplementation daily.         Other   Hyperlipidemia    Continued hyperlipidemia present on labs.  Discussed the significance of elevated cholesterol levels with the patient today. At this time she would like to try to improve these levels with diet, exercise, and natural supplementation. Discussed with the patient the option to trial these for the next 6 months and plan to follow-up with repeat labs at that time. Discussed with patient the possibility of starting atorvastatin 10 mg daily if conservative measures are not effective to help reduce her risk of cardiovascular complications. Follow-up with labs in 6 months-orders placed today.       Relevant Orders   Lipid panel   COMPLETE METABOLIC PANEL WITH GFR   Depression    She is doing well with daily Effexor. Refills provided today. Follow-up in approximately 1 year or sooner if needed.      Primary cancer of lower-inner quadrant of right female breast [facility])    History of breast cancer with double mastectomy and chemotherapy. She denies any changes to chest wall or breast tissue today.  She is followed by oncology for follow-up management. Continue to follow-up with oncology and will be happy to provide additional support as needed. No mammogram needed due to mastectomy.      Relevant Medications   valACYclovir (VALTREX) 1000 MG tablet   prochlorperazine (COMPAZINE) 5 MG tablet   Vitamin D deficiency    Discussed laboratory results revealing vitamin D deficiency with patient. At this time she has stopped taking her vitamin D supplementation. Recommend 600 to 800 IU vitamin D daily. Prescription provided today.      Iron deficiency    Iron deficiency without anemia discussed with the patient today. Plan to begin 325 mg of ferrous sulfate 3 days a week and will reevaluate labs in approximately 6 months. Also provided patient with information on food sources that are high in iron that may be helpful for this condition. Recommend follow-up if symptoms begin to appear including fatigue, weakness, shortness of breath.      Relevant Medications   ferrous sulfate 325 (65 FE) MG EC tablet   Elevated liver enzymes    Elevated liver enzymes in the setting of celiac disease. Discussed liver enzymes with the patient-these appear to be improving with a diet reduced in gluten. Discussed with the patient the importance of continuing a gluten-free diet. We will reevaluate labs in approximately 6 months.  Orders placed.      Prediabetes    Discussed elevated hemoglobin A1c with patient and her diagnosis of prediabetes. This has improved since her last check. Discussed with the  patient dietary recommendations to help lower her blood glucose levels.  Information provided. Plan to follow-up in 1 year with hemoglobin A1c testing.  Or sooner if needed.      Screening for HIV (human immunodeficiency virus)    HIV screening orders placed today.  Discussed with patient option to have these labs drawn in approximately 6 months when she comes back for follow-up labs.  Relevant Medications   valACYclovir (VALTREX) 1000 MG tablet   Other Relevant Orders   HIV Antibody (routine testing w rflx)   Need for vaccination with 13-polyvalent pneumococcal conjugate vaccine    Pneumococcal vaccine provided today.      Relevant Orders   Pneumococcal conjugate vaccine 13-valent IM (Completed)   Need for influenza vaccination    Flu vaccine provided today.      Relevant Orders   Flu Vaccine QUAD High Dose(Fluad) (Completed)   Screening for colon cancer    Patient is overdue for colonoscopy. Orders placed for referral to GI for colonoscopy.      Relevant Orders   Ambulatory referral to Gastroenterology   Screening for osteoporosis    Patient is overdue for DEXA scan today. Orders placed today for DEXA scan.  We will determine the need to restart Fosamax based on results. Recommend vitamin D 600 to 800 IU daily.      Relevant Orders   DG Bone Density       Follow up plan: Return in about 6 months (around 10/27/2020) for labs only .   LABORATORY TESTING: - Annual labs performed prior to visit- iron deficiency, decreased vitamin d, celiac disease, hyperlipidemia, and pre-diabetes detected.   - Pap smear: done elsewhere - STI testing: deferred  IMMUNIZATIONS:   - Tdap: Tetanus vaccination status reviewed: last tetanus booster within 10 years. - Due 2026 - Influenza: Administered today - Pneumovax 23: Administered today  - Prevnar 13: Not required if Pneumovax is completed - HPV: Not applicable - Shingrix vaccine: Up to date  SCREENING: -Mammogram:  Deferred- double mastectomy- examinations by oncology  - Colonoscopy: Ordered today  - Bone Density: Ordered today    PATIENT COUNSELING:   Advised to avoid cigarette smoking.  I discussed with the patient that most people either abstain from alcohol or drink within safe limits (<=14/week and <=4 drinks/occasion for males, <=7/weeks and <= 3 drinks/occasion for females) and that the risk for alcohol disorders and other health effects rises proportionally with the number of drinks per week and how often a drinker exceeds daily limits.  Discussed cessation/primary prevention of drug use and availability of treatment for abuse.   Diet: Encouraged to adjust caloric intake to maintain  or achieve ideal body weight, to reduce intake of dietary saturated fat and total fat, to limit sodium intake by avoiding high sodium foods and not adding table salt, and to maintain adequate dietary potassium and calcium preferably from fresh fruits, vegetables, and low-fat dairy products.    Stressed the importance of regular exercise  Injury prevention: Discussed safety belts, safety helmets, smoke detector, smoking near bedding or upholstery.   Dental health: Discussed importance of regular tooth brushing, flossing, and dental visits.    NEXT PREVENTATIVE PHYSICAL DUE IN 1 YEAR. Return in about 6 months (around 10/27/2020) for labs only .

## 2020-04-28 NOTE — Assessment & Plan Note (Signed)
HIV screening orders placed today.  Discussed with patient option to have these labs drawn in approximately 6 months when she comes back for follow-up labs.

## 2020-04-28 NOTE — Assessment & Plan Note (Signed)
She is doing well with daily Effexor. Refills provided today. Follow-up in approximately 1 year or sooner if needed.

## 2020-05-03 ENCOUNTER — Ambulatory Visit (INDEPENDENT_AMBULATORY_CARE_PROVIDER_SITE_OTHER): Payer: BC Managed Care – PPO | Admitting: Plastic Surgery

## 2020-05-03 ENCOUNTER — Other Ambulatory Visit: Payer: Self-pay

## 2020-05-03 ENCOUNTER — Encounter: Payer: Self-pay | Admitting: Plastic Surgery

## 2020-05-03 VITALS — BP 147/89 | HR 97 | Temp 98.5°F

## 2020-05-03 DIAGNOSIS — Z9013 Acquired absence of bilateral breasts and nipples: Secondary | ICD-10-CM | POA: Diagnosis not present

## 2020-05-03 DIAGNOSIS — Z9889 Other specified postprocedural states: Secondary | ICD-10-CM

## 2020-05-03 DIAGNOSIS — N651 Disproportion of reconstructed breast: Secondary | ICD-10-CM | POA: Diagnosis not present

## 2020-05-03 NOTE — Progress Notes (Signed)
   Subjective:    Patient ID: Nancy Beard, female    DOB: January 25, 1956, 64 y.o.   MRN: [id number]  The patient is a 64 yrs old wf here for a yearly exam.  She underwent bilateral breast reconstruction 6 years ago.  She had bilateral mastectomies in 2014 with reconstruction.  She had lipofilling in 2015 for improved symmetry.  The implants are soft and intact.  Incisions are well healed.  She is 5 feet 6 inches tall.  She has good symmetry but loss of volume in the superior aspect of the breasts and the lateral aspect of the right breast. She and her husband are doing well.  She is still working and thinking about maybe retirement in next year.  She is not following up with oncology since she is greater than 5 years cancer free.     Review of Systems  Constitutional: Negative.  Negative for activity change.  HENT: Negative.   Eyes: Negative.   Respiratory: Negative.  Negative for chest tightness and shortness of breath.   Cardiovascular: Negative.  Negative for leg swelling.  Gastrointestinal: Negative for abdominal distention and abdominal pain.  Endocrine: Negative.   Genitourinary: Negative.   Musculoskeletal: Negative.   Hematological: Negative.   Psychiatric/Behavioral: Negative.        Objective:   Physical Exam Vitals and nursing note reviewed.  Constitutional:      Appearance: Normal appearance.  HENT:     Head: Normocephalic and atraumatic.  Cardiovascular:     Rate and Rhythm: Normal rate.     Pulses: Normal pulses.  Pulmonary:     Effort: Pulmonary effort is normal. No respiratory distress.  Abdominal:     General: Abdomen is flat. There is no distension.  Skin:    General: Skin is warm.     Capillary Refill: Capillary refill takes less than 2 seconds.  Neurological:     General: No focal deficit present.     Mental Status: She is alert and oriented to person, place, and time.  Psychiatric:        Mood and Affect: Mood normal.        Behavior: Behavior  normal.        Thought Content: Thought content normal.        Assessment & Plan:     ICD-10-CM   1. Status post bilateral breast reconstruction  Z98.890   2. Acquired absence of bilateral breasts and nipples  Z90.13   3. Breast asymmetry following reconstructive surgery  N65.1     The patient is a good candidate for lipofilling for improved symmetry. She is not interested in NAC tattoo reconstruction.  Pictures were obtained of the patient and placed in the chart with the patient's or guardian's permission.

## 2020-05-18 ENCOUNTER — Encounter: Payer: Self-pay | Admitting: Internal Medicine

## 2020-05-29 NOTE — H&P (View-Only) (Signed)
ICD-10-CM   1. Breast asymmetry following reconstructive surgery  N65.1   2. Status post bilateral breast reconstruction  Z98.890       Patient ID: Nancy Beard, female    DOB: Dec 04, 1955, 64 y.o.   MRN: [id number]   History of Present Illness: Nancy Beard is a 64 y.o.  female  with a history of volume loss after breast reconstruction.  She presents for preoperative evaluation for upcoming procedure, liposuction with lipofilling of bilateral breasts, scheduled for 06/08/2020 with Dr. Marla Roe.  Summary from previous visit: Patient had bilateral mastectomies in 2014 with reconstruction.  She had lipofilling in 2015 for improved symmetry.  The implants are soft and intact and incisions are well-healed.  She has good symmetry but loss of volume in the superior aspect of the breasts and lateral aspect of the right breast.  She is 5 feet 6 inches tall.  She has not followed with oncology since she is more than 5 years cancer free.  She is not interested in NAC tattoo reconstruction.  Reports she had lipofilling in the past and she didn't retain much.  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   Past Medical History: Allergies: Allergies  Allergen Reactions  . Ciprofloxacin Itching and Other (See Comments)    Patient states she had muscle spasms  . Macrobid [Nitrofurantoin] Hives and Other (See Comments)    Significant transaminitis  . Dilaudid [Hydromorphone Hcl] Itching  . Gluten Meal Other (See Comments)    Current Medications:  Current Outpatient Medications:  .  AMBULATORY NON FORMULARY MEDICATION, BLOOD PRESSURE MONITOR - ARM CUFF PER PATIENT PREFERENCE / INSURANCE COVERAGE, DX: ELEVATED BLOOD PRESSURE, Disp: 1 Units, Rfl: 99 .  Cholecalciferol (VITAMIN D-3 PO), Take 1 tablet by mouth  daily. , Disp: , Rfl:  .  ferrous sulfate 325 (65 FE) MG EC tablet, Take one tablet (325 mg) three days a week spaced apart., Disp: 90 tablet, Rfl: 11 .  levothyroxine (SYNTHROID) 112 MCG tablet, Take 1 tablet (112 mcg total) by mouth daily before breakfast., Disp: 90 tablet, Rfl: 1 .  Multiple Vitamin (MULTIVITAMIN) tablet, Take 1 tablet by mouth daily., Disp: , Rfl:  .  prochlorperazine (COMPAZINE) 5 MG tablet, Take 1 tablet (5 mg total) by mouth every 6 (six) hours as needed for nausea or vomiting., Disp: 30 tablet, Rfl: 0 .  valACYclovir (VALTREX) 1000 MG tablet, Take 2 tablets by mouth 2 (two) times daily as needed (OUTBREAK). , Disp: , Rfl:  .  venlafaxine XR (EFFEXOR-XR) 150 MG 24 hr capsule, Take 1 capsule (150 mg total) by mouth daily with breakfast., Disp: 90 capsule, Rfl: 3  Past Medical Problems: Past Medical History:  Diagnosis Date  . Anxiety   . Breast cancer ([place]) 2014   right, bil mast  . Celiac disease   . Depression   . DIVERTICULOSIS, COLON 05/05/2007  . DVT (deep venous thrombosis) ([place]) 2014   Right lower extremity, thigh  . DVT (deep venous thrombosis) ([place]) 06/30/2013   August 2014. Continued Xarelto due to persistent chronic DVT R popliteal vein. Plan repeat per oncology March 2016   . Fracture of left tibial plateau 06/15/2015  . Fracture tibia/fibula 06/14/2015  . HEMORRHOIDS, EXTERNAL 05/05/2007  . History of chemotherapy   . Hypothyroidism   . Neuropathy    of  fingers and toes  . Pneumonia    hx of years ago  . Thyroid disease   . Vitamin D insufficiency 06/20/2015  . Wears glasses     Past Surgical History: Past Surgical History:  Procedure Laterality Date  . AXILLARY SENTINEL NODE BIOPSY Right 09/11/2012   Procedure: AXILLARY SENTINEL lymph NODE  BIOPSY;  Surgeon: Odis Hollingshead, MD;  Location: [facility];  Service: General;  Laterality: Right;  right nuclear medicine injection 12:30   . BREAST LUMPECTOMY     benign lump  . BREAST RECONSTRUCTION  Right 09/24/2013   Procedure: REVISION OF RIGHT BREAST RECONSTRUCTION WITH REPOSITIONING RIGHT IMPLANT, POSSIBLE EXCISION CAPSULAR CONTRACTURE AND LIPOFILLING FOR FAT GRAFTING;  Surgeon: Theodoro Kos, DO;  Location: [facility];  Service: Plastics;  Laterality: Right;  . BREAST RECONSTRUCTION WITH PLACEMENT OF TISSUE EXPANDER AND FLEX HD (ACELLULAR HYDRATED DERMIS) Bilateral 09/11/2012   Procedure: BREAST RECONSTRUCTION WITH PLACEMENT OF TISSUE EXPANDER AND FLEX HD (ACELLULAR HYDRATED DERMIS) ADM;  Surgeon: Theodoro Kos, DO;  Location: [facility];  Service: Plastics;  Laterality: Bilateral;  . COLONOSCOPY  2006  . cryoblation of cervix     long time ago  . ESOPHAGOGASTRODUODENOSCOPY  2007   sprue  . EXTERNAL FIXATION LEG Left 06/15/2015   Procedure: EXTERNAL FIXATION LEG;  Surgeon: Altamese Alcoa, MD;  Location: [facility];  Service: Orthopedics;  Laterality: Left;  . INCISION AND DRAINAGE OF WOUND Left 09/16/2012   Procedure: Left Breast Evacuation of Hematoma;  Surgeon: Theodoro Kos, DO;  Location: [facility];  Service: Plastics;  Laterality: Left;  . LIPOSUCTION WITH LIPOFILLING Bilateral 09/24/2013   Procedure: LIPOSUCTION WITH LIPOFILLING;  Surgeon: Theodoro Kos, DO;  Location: [facility];  Service: Plastics;  Laterality: Bilateral;  Biltalteal filling breast  . ORIF TIBIA PLATEAU Left 06/21/2015   Procedure: OPEN REDUCTION INTERNAL FIXATION (ORIF) TIBIAL PLATEAU REMOVAL OF EXTERNAL FISTULA;  Surgeon: Altamese Acomita Lake, MD;  Location: [facility];  Service: Orthopedics;  Laterality: Left;  . PORT-A-CATH REMOVAL Right 08/07/2013   Procedure: REMOVAL PORT-A-CATH;  Surgeon: Odis Hollingshead, MD;  Location: [facility];  Service: General;  Laterality: Right;  . PORTACATH PLACEMENT Right 10/09/2012   Procedure: US GUIDED INSERTION PORT-A-CATH;  Surgeon: Odis Hollingshead, MD;  Location: [facility];  Service: General;  Laterality: Right;  Right Subclavian Vein  .  REMOVAL OF BILATERAL TISSUE EXPANDERS WITH PLACEMENT OF BILATERAL BREAST IMPLANTS Bilateral 06/24/2013   Procedure: REMOVAL OF BILATERAL TISSUE EXPANDERS WITH PLACEMENT OF BILATERAL BREAST IMPLANTS;  Surgeon: Theodoro Kos, DO;  Location: [facility];  Service: Plastics;  Laterality: Bilateral;  . TOTAL MASTECTOMY Bilateral 09/11/2012   Procedure: bilateral MASTECTOMY;  Surgeon: Odis Hollingshead, MD;  Location: [facility];  Service: General;  Laterality: Bilateral;  . VULVECTOMY N/A 07/14/2013   Procedure: WIDE LOCAL  EXCISION VULVAR;  Surgeon: Alvino Chapel, MD;  Location: [facility];  Service: Gynecology;  Laterality: N/A;    Social History: Social History   Socioeconomic History  . Marital status: Married    Spouse name: Not on file  . Number of children: Not on file  . Years of education: Not on file  . Highest education level: Not on file  Occupational History  . Occupation: Conservation officer, historic buildings: [place]  Tobacco Use  . Smoking status: Never Smoker  . Smokeless tobacco: Never Used  Vaping Use  . Vaping Use: Never used  Substance and Sexual Activity  .  Alcohol use: No  . Drug use: No  . Sexual activity: Yes    Partners: Male    Birth control/protection: Post-menopausal  Other Topics Concern  . Not on file  Social History Narrative   Married. 1 son- 1 grandson '18 lives in [place].       Works for [employer] full time.       Hobbies: reading/knitting/crochet/outside in fresh air   Social Determinants of Health   Financial Resource Strain:   . Difficulty of Paying Living Expenses: Not on file  Food Insecurity:   . Worried About Charity fundraiser in the Last Year: Not on file  . Ran Out of Food in the Last Year: Not on file  Transportation Needs:   . Lack of Transportation (Medical): Not on file  . Lack of Transportation (Non-Medical): Not on file  Physical Activity:   . Days of Exercise per Week: Not on file  . Minutes of  Exercise per Session: Not on file  Stress:   . Feeling of Stress : Not on file  Social Connections:   . Frequency of Communication with Friends and Family: Not on file  . Frequency of Social Gatherings with Friends and Family: Not on file  . Attends Religious Services: Not on file  . Active Member of Clubs or Organizations: Not on file  . Attends Archivist Meetings: Not on file  . Marital Status: Not on file  Intimate Partner Violence:   . Fear of Current or Ex-Partner: Not on file  . Emotionally Abused: Not on file  . Physically Abused: Not on file  . Sexually Abused: Not on file    Family History: Family History  Problem Relation Age of Onset  . Breast cancer Paternal Aunt        dx in her 33s  . Lymphoma Paternal Uncle   . Breast cancer Paternal Grandmother        dx >50  . Heart attack Paternal Grandfather   . Breast cancer Other        2 maternal great aunts with breast cancer >50  . Osteoporosis Mother        controlled with diet/exercise  . COPD Father     Review of Systems: Review of Systems  Constitutional: Negative for chills and fever.  HENT: Negative for congestion and sore throat.   Respiratory: Negative for cough and shortness of breath.   Cardiovascular: Negative for chest pain.  Gastrointestinal: Negative for abdominal pain, nausea and vomiting.  Skin: Negative for itching and rash.    Physical Exam: Vital Signs BP (!) 163/97 (BP Location: Left Arm, Patient Position: Sitting, Cuff Size: Normal)   Pulse 96   Temp 98.2 F (36.8 C) (Oral)   Ht 5\' 6"  (1.676 m)   Wt 178 lb 3.2 oz (80.8 kg)   LMP 09/11/2012   SpO2 96%   BMI 28.76 kg/m  Physical Exam Constitutional:      General: She is not in acute distress.    Appearance: Normal appearance. She is normal weight. She is not ill-appearing.  HENT:     Head: Normocephalic and atraumatic.  Eyes:     Extraocular Movements: Extraocular movements intact.  Cardiovascular:     Rate and  Rhythm: Normal rate and regular rhythm.     Pulses: Normal pulses.     Heart sounds: Normal heart sounds.  Pulmonary:     Effort: Pulmonary effort is normal.     Breath sounds:  Normal breath sounds. No wheezing, rhonchi or rales.  Abdominal:     General: Bowel sounds are normal.     Palpations: Abdomen is soft.  Musculoskeletal:        General: No swelling. Normal range of motion.     Cervical back: Normal range of motion.  Skin:    General: Skin is warm and dry.     Coloration: Skin is not pale.     Findings: No erythema or rash.  Neurological:     General: No focal deficit present.     Mental Status: She is alert and oriented to person, place, and time.  Psychiatric:        Mood and Affect: Mood normal.        Behavior: Behavior normal.        Thought Content: Thought content normal.        Judgment: Judgment normal.     Assessment/Plan:  Nancy Beard scheduled for liposuction with lipofilling of bilateral breasts with Dr. Marla Roe.  Risks, benefits, and alternatives of procedure discussed, questions answered and consent obtained.    Smoking Status: non-smoker; Counseling Given? N/A Last Mammogram: N/A -bilateral mastectomies  Caprini Score: High; Risk Factors include: 64 year old female, Hx cancer, Hx DVTs, BMI > 25, and length of planned surgery. Recommendation for mechanical and pharmacological prophylaxis during surgery. Encourage early ambulation.   Pictures obtained: 05/03/20  Post-op Rx sent to pharmacy: Tramadol, Zofran, Keflex  Patient was provided with the liposuction risks and General Surgical Risk consent document and Pain Medication Agreement prior to their appointment.  They had adequate time to read through the risk consent documents and Pain Medication Agreement. We also discussed them in person together during this preop appointment. All of their questions were answered to their satisfaction.  Recommended calling if they have any further questions.  Risk  consent form and Pain Medication Agreement to be scanned into patient's chart.  The risks that can be encountered with and after liposuction were discussed and include the following but no limited to these:  Asymmetry, fluid accumulation, firmness of the area, fat necrosis with death of fat tissue, bleeding, infection, delayed healing, anesthesia risks, skin sensation changes, injury to structures including nerves, blood vessels, and muscles which may be temporary or permanent, allergies to tape, suture materials and glues, blood products, topical preparations or injected agents, skin and contour irregularities, skin discoloration and swelling, deep vein thrombosis, cardiac and pulmonary complications, pain, which may persist, persistent pain, recurrence of the lesion, poor healing of the incision, possible need for revisional surgery or staged procedures. Thiere can also be persistent swelling, poor wound healing, rippling or loose skin, worsening of cellulite, swelling, and thermal burn or heat injury from ultrasound with the ultrasound-assisted lipoplasty technique. Any change in weight fluctuations can alter the outcome.  Electronically signed by: Threasa Heads, PA-C 06/01/2020 12:38 PM

## 2020-05-29 NOTE — Progress Notes (Signed)
ICD-10-CM   1. Breast asymmetry following reconstructive surgery  N65.1   2. Status post bilateral breast reconstruction  Z98.890       Patient ID: Nancy Beard, female    DOB: [birth year], 64 y.o.   MRN: [id number]   History of Present Illness: Nancy Beard is a 64 y.o.  female  with a history of volume loss after breast reconstruction.  She presents for preoperative evaluation for upcoming procedure, liposuction with lipofilling of bilateral breasts, scheduled for 06/08/2020 with Dr. Marla Roe.  Summary from previous visit: Patient had bilateral mastectomies in 2014 with reconstruction.  She had lipofilling in 2015 for improved symmetry.  The implants are soft and intact and incisions are well-healed.  She has good symmetry but loss of volume in the superior aspect of the breasts and lateral aspect of the right breast.  She is 5 feet 6 inches tall.  She has not followed with oncology since she is more than 5 years cancer free.  She is not interested in NAC tattoo reconstruction.  Reports she had lipofilling in the past and she didn't retain much.  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   Past Medical History: Allergies: Allergies  Allergen Reactions  . Ciprofloxacin Itching and Other (See Comments)    Patient states she had muscle spasms  . Macrobid [Nitrofurantoin] Hives and Other (See Comments)    Significant transaminitis  . Dilaudid [Hydromorphone Hcl] Itching  . Gluten Meal Other (See Comments)    Current Medications:  Current Outpatient Medications:  .  AMBULATORY NON FORMULARY MEDICATION, BLOOD PRESSURE MONITOR - ARM CUFF PER PATIENT PREFERENCE / INSURANCE COVERAGE, DX: ELEVATED BLOOD PRESSURE, Disp: 1 Units, Rfl: 99 .  Cholecalciferol (VITAMIN D-3 PO), Take 1 tablet by mouth  daily. , Disp: , Rfl:  .  ferrous sulfate 325 (65 FE) MG EC tablet, Take one tablet (325 mg) three days a week spaced apart., Disp: 90 tablet, Rfl: 11 .  levothyroxine (SYNTHROID) 112 MCG tablet, Take 1 tablet (112 mcg total) by mouth daily before breakfast., Disp: 90 tablet, Rfl: 1 .  Multiple Vitamin (MULTIVITAMIN) tablet, Take 1 tablet by mouth daily., Disp: , Rfl:  .  prochlorperazine (COMPAZINE) 5 MG tablet, Take 1 tablet (5 mg total) by mouth every 6 (six) hours as needed for nausea or vomiting., Disp: 30 tablet, Rfl: 0 .  valACYclovir (VALTREX) 1000 MG tablet, Take 2 tablets by mouth 2 (two) times daily as needed (OUTBREAK). , Disp: , Rfl:  .  venlafaxine XR (EFFEXOR-XR) 150 MG 24 hr capsule, Take 1 capsule (150 mg total) by mouth daily with breakfast., Disp: 90 capsule, Rfl: 3  Past Medical Problems: Past Medical History:  Diagnosis Date  . Anxiety   . Breast cancer ([place]) 2014   right, bil mast  . Celiac disease   . Depression   . DIVERTICULOSIS, COLON 05/05/2007  . DVT (deep venous thrombosis) ([place]) 2014   Right lower extremity, thigh  . DVT (deep venous thrombosis) ([place]) 06/30/2013   August 2014. Continued Xarelto due to persistent chronic DVT R popliteal vein. Plan repeat per oncology March 2016   . Fracture of left tibial plateau 06/15/2015  . Fracture tibia/fibula 06/14/2015  . HEMORRHOIDS, EXTERNAL 05/05/2007  . History of chemotherapy   . Hypothyroidism   . Neuropathy    of  fingers and toes  . Pneumonia    hx of years ago  . Thyroid disease   . Vitamin D insufficiency 06/20/2015  . Wears glasses     Past Surgical History: Past Surgical History:  Procedure Laterality Date  . AXILLARY SENTINEL NODE BIOPSY Right 09/11/2012   Procedure: AXILLARY SENTINEL lymph NODE  BIOPSY;  Surgeon: Odis Hollingshead, MD;  Location: [facility];  Service: General;  Laterality: Right;  right nuclear medicine injection 12:30   . BREAST LUMPECTOMY     benign lump  . BREAST RECONSTRUCTION  Right 09/24/2013   Procedure: REVISION OF RIGHT BREAST RECONSTRUCTION WITH REPOSITIONING RIGHT IMPLANT, POSSIBLE EXCISION CAPSULAR CONTRACTURE AND LIPOFILLING FOR FAT GRAFTING;  Surgeon: Theodoro Kos, DO;  Location: [facility];  Service: Plastics;  Laterality: Right;  . BREAST RECONSTRUCTION WITH PLACEMENT OF TISSUE EXPANDER AND FLEX HD (ACELLULAR HYDRATED DERMIS) Bilateral 09/11/2012   Procedure: BREAST RECONSTRUCTION WITH PLACEMENT OF TISSUE EXPANDER AND FLEX HD (ACELLULAR HYDRATED DERMIS) ADM;  Surgeon: Theodoro Kos, DO;  Location: [facility];  Service: Plastics;  Laterality: Bilateral;  . COLONOSCOPY  2006  . cryoblation of cervix     long time ago  . ESOPHAGOGASTRODUODENOSCOPY  2007   sprue  . EXTERNAL FIXATION LEG Left 06/15/2015   Procedure: EXTERNAL FIXATION LEG;  Surgeon: Altamese Wamic, MD;  Location: [facility];  Service: Orthopedics;  Laterality: Left;  . INCISION AND DRAINAGE OF WOUND Left 09/16/2012   Procedure: Left Breast Evacuation of Hematoma;  Surgeon: Theodoro Kos, DO;  Location: [facility];  Service: Plastics;  Laterality: Left;  . LIPOSUCTION WITH LIPOFILLING Bilateral 09/24/2013   Procedure: LIPOSUCTION WITH LIPOFILLING;  Surgeon: Theodoro Kos, DO;  Location: [facility];  Service: Plastics;  Laterality: Bilateral;  Biltalteal filling breast  . ORIF TIBIA PLATEAU Left 06/21/2015   Procedure: OPEN REDUCTION INTERNAL FIXATION (ORIF) TIBIAL PLATEAU REMOVAL OF EXTERNAL FISTULA;  Surgeon: Altamese Rio Arriba, MD;  Location: [facility];  Service: Orthopedics;  Laterality: Left;  . PORT-A-CATH REMOVAL Right 08/07/2013   Procedure: REMOVAL PORT-A-CATH;  Surgeon: Odis Hollingshead, MD;  Location: [facility];  Service: General;  Laterality: Right;  . PORTACATH PLACEMENT Right 10/09/2012   Procedure: US GUIDED INSERTION PORT-A-CATH;  Surgeon: Odis Hollingshead, MD;  Location: [facility];  Service: General;  Laterality: Right;  Right Subclavian Vein  .  REMOVAL OF BILATERAL TISSUE EXPANDERS WITH PLACEMENT OF BILATERAL BREAST IMPLANTS Bilateral 06/24/2013   Procedure: REMOVAL OF BILATERAL TISSUE EXPANDERS WITH PLACEMENT OF BILATERAL BREAST IMPLANTS;  Surgeon: Theodoro Kos, DO;  Location: [facility];  Service: Plastics;  Laterality: Bilateral;  . TOTAL MASTECTOMY Bilateral 09/11/2012   Procedure: bilateral MASTECTOMY;  Surgeon: Odis Hollingshead, MD;  Location: [facility];  Service: General;  Laterality: Bilateral;  . VULVECTOMY N/A 07/14/2013   Procedure: WIDE LOCAL  EXCISION VULVAR;  Surgeon: Alvino Chapel, MD;  Location: [facility];  Service: Gynecology;  Laterality: N/A;    Social History: Social History   Socioeconomic History  . Marital status: Married    Spouse name: Not on file  . Number of children: Not on file  . Years of education: Not on file  . Highest education level: Not on file  Occupational History  . 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 Smoking status: Never Smoker  . Smokeless tobacco: Never Used  Vaping Use  . Vaping Use: Never used  Substance and Sexual Activity  .  Alcohol use: No  . Drug use: No  . Sexual activity: Yes    Partners: Male    Birth control/protection: Post-menopausal  Other Topics Concern  . Not on file  Social History Narrative   Married. 1 son- 1 grandson '85 lives in [place].       Works for [employer] full time.       Hobbies: reading/knitting/crochet/outside in fresh air   Social Determinants of Health   Financial Resource Strain:   . Difficulty of Paying Living Expenses: Not on file  Food Insecurity:   . Worried About Charity fundraiser in the Last Year: Not on file  . Ran Out of Food in the Last Year: Not on file  Transportation Needs:   . Lack of Transportation (Medical): Not on file  . Lack of Transportation (Non-Medical): Not on file  Physical Activity:   . Days of Exercise per Week: Not on file  . Minutes of  Exercise per Session: Not on file  Stress:   . Feeling of Stress : Not on file  Social Connections:   . Frequency of Communication with Friends and Family: Not on file  . Frequency of Social Gatherings with Friends and Family: Not on file  . Attends Religious Services: Not on file  . Active Member of Clubs or Organizations: Not on file  . Attends Archivist Meetings: Not on file  . Marital Status: Not on file  Intimate Partner Violence:   . Fear of Current or Ex-Partner: Not on file  . Emotionally Abused: Not on file  . Physically Abused: Not on file  . Sexually Abused: Not on file    Family History: Family History  Problem Relation Age of Onset  . Breast cancer Paternal Aunt        dx in her 1s  . Lymphoma Paternal Uncle   . Breast cancer Paternal Grandmother        dx >50  . Heart attack Paternal Grandfather   . Breast cancer Other        2 maternal great aunts with breast cancer >50  . Osteoporosis Mother        controlled with diet/exercise  . COPD Father     Review of Systems: Review of Systems  Constitutional: Negative for chills and fever.  HENT: Negative for congestion and sore throat.   Respiratory: Negative for cough and shortness of breath.   Cardiovascular: Negative for chest pain.  Gastrointestinal: Negative for abdominal pain, nausea and vomiting.  Skin: Negative for itching and rash.    Physical Exam: Vital Signs BP (!) 163/97 (BP Location: Left Arm, Patient Position: Sitting, Cuff Size: Normal)   Pulse 96   Temp 98.2 F (36.8 C) (Oral)   Ht 5\' 6"  (1.676 m)   Wt 178 lb 3.2 oz (80.8 kg)   LMP 09/11/2012   SpO2 96%   BMI 28.76 kg/m  Physical Exam Constitutional:      General: She is not in acute distress.    Appearance: Normal appearance. She is normal weight. She is not ill-appearing.  HENT:     Head: Normocephalic and atraumatic.  Eyes:     Extraocular Movements: Extraocular movements intact.  Cardiovascular:     Rate and  Rhythm: Normal rate and regular rhythm.     Pulses: Normal pulses.     Heart sounds: Normal heart sounds.  Pulmonary:     Effort: Pulmonary effort is normal.     Breath sounds:  Normal breath sounds. No wheezing, rhonchi or rales.  Abdominal:     General: Bowel sounds are normal.     Palpations: Abdomen is soft.  Musculoskeletal:        General: No swelling. Normal range of motion.     Cervical back: Normal range of motion.  Skin:    General: Skin is warm and dry.     Coloration: Skin is not pale.     Findings: No erythema or rash.  Neurological:     General: No focal deficit present.     Mental Status: She is alert and oriented to person, place, and time.  Psychiatric:        Mood and Affect: Mood normal.        Behavior: Behavior normal.        Thought Content: Thought content normal.        Judgment: Judgment normal.     Assessment/Plan:  Nancy Beard scheduled for liposuction with lipofilling of bilateral breasts with Dr. Marla Roe.  Risks, benefits, and alternatives of procedure discussed, questions answered and consent obtained.    Smoking Status: non-smoker; Counseling Given? N/A Last Mammogram: N/A -bilateral mastectomies  Caprini Score: High; Risk Factors include: 64 year old female, Hx cancer, Hx DVTs, BMI > 25, and length of planned surgery. Recommendation for mechanical and pharmacological prophylaxis during surgery. Encourage early ambulation.   Pictures obtained: 05/03/20  Post-op Rx sent to pharmacy: Tramadol, Zofran, Keflex  Patient was provided with the liposuction risks and General Surgical Risk consent document and Pain Medication Agreement prior to their appointment.  They had adequate time to read through the risk consent documents and Pain Medication Agreement. We also discussed them in person together during this preop appointment. All of their questions were answered to their satisfaction.  Recommended calling if they have any further questions.  Risk  consent form and Pain Medication Agreement to be scanned into patient's chart.  The risks that can be encountered with and after liposuction were discussed and include the following but no limited to these:  Asymmetry, fluid accumulation, firmness of the area, fat necrosis with death of fat tissue, bleeding, infection, delayed healing, anesthesia risks, skin sensation changes, injury to structures including nerves, blood vessels, and muscles which may be temporary or permanent, allergies to tape, suture materials and glues, blood products, topical preparations or injected agents, skin and contour irregularities, skin discoloration and swelling, deep vein thrombosis, cardiac and pulmonary complications, pain, which may persist, persistent pain, recurrence of the lesion, poor healing of the incision, possible need for revisional surgery or staged procedures. Thiere can also be persistent swelling, poor wound healing, rippling or loose skin, worsening of cellulite, swelling, and thermal burn or heat injury from ultrasound with the ultrasound-assisted lipoplasty technique. Any change in weight fluctuations can alter the outcome.  Electronically signed by: Threasa Heads, PA-C 06/01/2020 12:38 PM

## 2020-05-30 ENCOUNTER — Other Ambulatory Visit: Payer: Self-pay

## 2020-05-30 ENCOUNTER — Encounter (HOSPITAL_BASED_OUTPATIENT_CLINIC_OR_DEPARTMENT_OTHER): Payer: Self-pay | Admitting: Plastic Surgery

## 2020-06-01 ENCOUNTER — Other Ambulatory Visit: Payer: Self-pay

## 2020-06-01 ENCOUNTER — Encounter: Payer: Self-pay | Admitting: Plastic Surgery

## 2020-06-01 ENCOUNTER — Ambulatory Visit (INDEPENDENT_AMBULATORY_CARE_PROVIDER_SITE_OTHER): Payer: BC Managed Care – PPO | Admitting: Plastic Surgery

## 2020-06-01 VITALS — BP 163/97 | HR 96 | Temp 98.2°F | Ht 66.0 in | Wt 178.2 lb

## 2020-06-01 DIAGNOSIS — Z9889 Other specified postprocedural states: Secondary | ICD-10-CM

## 2020-06-01 DIAGNOSIS — N651 Disproportion of reconstructed breast: Secondary | ICD-10-CM

## 2020-06-01 MED ORDER — TRAMADOL HCL 50 MG PO TABS
50.0000 mg | ORAL_TABLET | Freq: Three times a day (TID) | ORAL | 0 refills | Status: DC | PRN
Start: 2020-06-01 — End: 2020-12-14

## 2020-06-01 MED ORDER — ONDANSETRON HCL 4 MG PO TABS: 4.0000 mg | ORAL_TABLET | Freq: Three times a day (TID) | ORAL | 0 refills | Status: DC | PRN

## 2020-06-01 MED ORDER — CEPHALEXIN 500 MG PO CAPS: 500.0000 mg | ORAL_CAPSULE | Freq: Four times a day (QID) | ORAL | 0 refills | Status: AC

## 2020-06-06 ENCOUNTER — Other Ambulatory Visit (HOSPITAL_COMMUNITY)
Admission: RE | Admit: 2020-06-06 | Discharge: 2020-06-06 | Disposition: A | Discharge status: Home / Self Care | Payer: BC Managed Care – PPO | Source: Ambulatory Visit | Attending: Plastic Surgery | Admitting: Plastic Surgery

## 2020-06-06 DIAGNOSIS — Z9013 Acquired absence of bilateral breasts and nipples: Secondary | ICD-10-CM | POA: Diagnosis not present

## 2020-06-06 DIAGNOSIS — Z79899 Other long term (current) drug therapy: Secondary | ICD-10-CM | POA: Diagnosis not present

## 2020-06-06 DIAGNOSIS — Z853 Personal history of malignant neoplasm of breast: Secondary | ICD-10-CM | POA: Diagnosis not present

## 2020-06-06 DIAGNOSIS — N651 Disproportion of reconstructed breast: Secondary | ICD-10-CM | POA: Diagnosis not present

## 2020-06-06 DIAGNOSIS — Z01812 Encounter for preprocedural laboratory examination: Secondary | ICD-10-CM | POA: Insufficient documentation

## 2020-06-06 DIAGNOSIS — Z20822 Contact with and (suspected) exposure to covid-19: Secondary | ICD-10-CM | POA: Insufficient documentation

## 2020-06-06 DIAGNOSIS — E039 Hypothyroidism, unspecified: Secondary | ICD-10-CM | POA: Diagnosis not present

## 2020-06-06 DIAGNOSIS — Z79890 Hormone replacement therapy (postmenopausal): Secondary | ICD-10-CM | POA: Diagnosis not present

## 2020-06-06 DIAGNOSIS — Z86718 Personal history of other venous thrombosis and embolism: Secondary | ICD-10-CM | POA: Diagnosis not present

## 2020-06-06 LAB — SARS CORONAVIRUS 2 (TAT 6-24 HRS): SARS Coronavirus 2: NEGATIVE

## 2020-06-08 ENCOUNTER — Ambulatory Visit (HOSPITAL_BASED_OUTPATIENT_CLINIC_OR_DEPARTMENT_OTHER): Payer: BC Managed Care – PPO | Admitting: Certified Registered"

## 2020-06-08 ENCOUNTER — Encounter (HOSPITAL_BASED_OUTPATIENT_CLINIC_OR_DEPARTMENT_OTHER): Payer: Self-pay | Admitting: Plastic Surgery

## 2020-06-08 ENCOUNTER — Other Ambulatory Visit: Payer: Self-pay

## 2020-06-08 ENCOUNTER — Ambulatory Visit (HOSPITAL_BASED_OUTPATIENT_CLINIC_OR_DEPARTMENT_OTHER)
Admission: RE | Admit: 2020-06-08 | Discharge: 2020-06-08 | Disposition: A | Discharge status: Home / Self Care | Payer: BC Managed Care – PPO | Attending: Plastic Surgery | Admitting: Plastic Surgery

## 2020-06-08 ENCOUNTER — Encounter (HOSPITAL_BASED_OUTPATIENT_CLINIC_OR_DEPARTMENT_OTHER)
Admission: RE | Disposition: A | Discharge status: Home / Self Care | Payer: Self-pay | Source: Home / Self Care | Attending: Plastic Surgery

## 2020-06-08 DIAGNOSIS — Z79899 Other long term (current) drug therapy: Secondary | ICD-10-CM | POA: Diagnosis not present

## 2020-06-08 DIAGNOSIS — N651 Disproportion of reconstructed breast: Secondary | ICD-10-CM | POA: Diagnosis not present

## 2020-06-08 DIAGNOSIS — Z9013 Acquired absence of bilateral breasts and nipples: Secondary | ICD-10-CM | POA: Insufficient documentation

## 2020-06-08 DIAGNOSIS — Z86718 Personal history of other venous thrombosis and embolism: Secondary | ICD-10-CM | POA: Diagnosis not present

## 2020-06-08 DIAGNOSIS — N6489 Other specified disorders of breast: Secondary | ICD-10-CM | POA: Diagnosis not present

## 2020-06-08 DIAGNOSIS — E785 Hyperlipidemia, unspecified: Secondary | ICD-10-CM | POA: Diagnosis not present

## 2020-06-08 DIAGNOSIS — Z20822 Contact with and (suspected) exposure to covid-19: Secondary | ICD-10-CM | POA: Diagnosis not present

## 2020-06-08 DIAGNOSIS — R7303 Prediabetes: Secondary | ICD-10-CM | POA: Diagnosis not present

## 2020-06-08 DIAGNOSIS — E039 Hypothyroidism, unspecified: Secondary | ICD-10-CM | POA: Diagnosis not present

## 2020-06-08 DIAGNOSIS — Z79890 Hormone replacement therapy (postmenopausal): Secondary | ICD-10-CM | POA: Diagnosis not present

## 2020-06-08 DIAGNOSIS — Z853 Personal history of malignant neoplasm of breast: Secondary | ICD-10-CM | POA: Insufficient documentation

## 2020-06-08 DIAGNOSIS — Z421 Encounter for breast reconstruction following mastectomy: Secondary | ICD-10-CM | POA: Diagnosis not present

## 2020-06-08 HISTORY — PX: LIPOSUCTION WITH LIPOFILLING: SHX6436

## 2020-06-08 HISTORY — DX: Anxiety disorder, unspecified: F41.9

## 2020-06-08 SURGERY — LIPOSUCTION, WITH FAT TRANSFER
Anesthesia: General | Site: Breast | Laterality: Bilateral

## 2020-06-08 MED ORDER — PHENYLEPHRINE 40 MCG/ML (10ML) SYRINGE FOR IV PUSH (FOR BLOOD PRESSURE SUPPORT)
PREFILLED_SYRINGE | INTRAVENOUS | Status: AC
  Filled 2020-06-08: qty 50

## 2020-06-08 MED ORDER — PROMETHAZINE HCL 25 MG/ML IJ SOLN: 6.2500 mg | INTRAMUSCULAR | Status: DC | PRN

## 2020-06-08 MED ORDER — EPHEDRINE 5 MG/ML INJ
INTRAVENOUS | Status: AC
  Filled 2020-06-08: qty 50

## 2020-06-08 MED ORDER — PROPOFOL 10 MG/ML IV BOLUS
INTRAVENOUS | Status: AC
  Filled 2020-06-08: qty 20

## 2020-06-08 MED ORDER — LIDOCAINE 2% (20 MG/ML) 5 ML SYRINGE
INTRAMUSCULAR | Status: AC
  Filled 2020-06-08: qty 5

## 2020-06-08 MED ORDER — MIDAZOLAM HCL 2 MG/2ML IJ SOLN
INTRAMUSCULAR | Status: AC
  Filled 2020-06-08: qty 2

## 2020-06-08 MED ORDER — LIDOCAINE HCL (CARDIAC) PF 100 MG/5ML IV SOSY
PREFILLED_SYRINGE | INTRAVENOUS | Status: DC | PRN
  Administered 2020-06-08: 80 mg via INTRAVENOUS

## 2020-06-08 MED ORDER — ACETAMINOPHEN 500 MG PO TABS
ORAL_TABLET | ORAL | Status: AC
  Filled 2020-06-08: qty 2

## 2020-06-08 MED ORDER — LIDOCAINE-EPINEPHRINE 1 %-1:100000 IJ SOLN
INTRAMUSCULAR | Status: DC | PRN
  Administered 2020-06-08: 20 mL

## 2020-06-08 MED ORDER — ACETAMINOPHEN 325 MG RE SUPP: 650.0000 mg | RECTAL | Status: DC | PRN

## 2020-06-08 MED ORDER — LACTATED RINGERS IV SOLN
INTRAVENOUS | Status: AC | PRN
  Administered 2020-06-08: 1000 mL via INTRAVENOUS

## 2020-06-08 MED ORDER — SODIUM CHLORIDE 0.9% FLUSH: 3.0000 mL | Freq: Two times a day (BID) | INTRAVENOUS | Status: DC

## 2020-06-08 MED ORDER — ONDANSETRON HCL 4 MG/2ML IJ SOLN
INTRAMUSCULAR | Status: AC
  Filled 2020-06-08: qty 2

## 2020-06-08 MED ORDER — SODIUM CHLORIDE 0.9 % IV SOLN: 250.0000 mL | INTRAVENOUS | Status: DC | PRN

## 2020-06-08 MED ORDER — LIDOCAINE HCL 1 % IJ SOLN
INTRAVENOUS | Status: DC | PRN
  Administered 2020-06-08: 600 mL

## 2020-06-08 MED ORDER — SODIUM CHLORIDE 0.9% FLUSH: 3.0000 mL | INTRAVENOUS | Status: DC | PRN

## 2020-06-08 MED ORDER — LABETALOL HCL 5 MG/ML IV SOLN
INTRAVENOUS | Status: AC
  Filled 2020-06-08: qty 4

## 2020-06-08 MED ORDER — EPHEDRINE SULFATE 50 MG/ML IJ SOLN
INTRAMUSCULAR | Status: DC | PRN
  Administered 2020-06-08: 10 mg via INTRAVENOUS
  Administered 2020-06-08: 5 mg via INTRAVENOUS
  Administered 2020-06-08: 10 mg via INTRAVENOUS

## 2020-06-08 MED ORDER — PHENYLEPHRINE HCL (PRESSORS) 10 MG/ML IV SOLN
INTRAVENOUS | Status: DC | PRN
  Administered 2020-06-08: 40 ug via INTRAVENOUS
  Administered 2020-06-08: 80 ug via INTRAVENOUS
  Administered 2020-06-08: 40 ug via INTRAVENOUS
  Administered 2020-06-08: 60 ug via INTRAVENOUS
  Administered 2020-06-08: 40 ug via INTRAVENOUS
  Administered 2020-06-08: 60 ug via INTRAVENOUS

## 2020-06-08 MED ORDER — ACETAMINOPHEN 325 MG PO TABS: 650.0000 mg | ORAL_TABLET | ORAL | Status: DC | PRN

## 2020-06-08 MED ORDER — FENTANYL CITRATE (PF) 100 MCG/2ML IJ SOLN: 25.0000 ug | INTRAMUSCULAR | Status: DC | PRN

## 2020-06-08 MED ORDER — LABETALOL HCL 5 MG/ML IV SOLN
10.0000 mg | INTRAVENOUS | Status: DC | PRN
  Administered 2020-06-08: 10 mg via INTRAVENOUS

## 2020-06-08 MED ORDER — FENTANYL CITRATE (PF) 100 MCG/2ML IJ SOLN
INTRAMUSCULAR | Status: AC
  Filled 2020-06-08: qty 2

## 2020-06-08 MED ORDER — ONDANSETRON HCL 4 MG/2ML IJ SOLN
INTRAMUSCULAR | Status: DC | PRN
  Administered 2020-06-08: 4 mg via INTRAVENOUS

## 2020-06-08 MED ORDER — CEFAZOLIN SODIUM-DEXTROSE 2-4 GM/100ML-% IV SOLN
INTRAVENOUS | Status: AC
  Filled 2020-06-08: qty 100

## 2020-06-08 MED ORDER — DEXAMETHASONE SODIUM PHOSPHATE 10 MG/ML IJ SOLN
INTRAMUSCULAR | Status: DC | PRN
  Administered 2020-06-08: 10 mg via INTRAVENOUS

## 2020-06-08 MED ORDER — LACTATED RINGERS IV SOLN: INTRAVENOUS | Status: DC

## 2020-06-08 MED ORDER — MIDAZOLAM HCL 5 MG/5ML IJ SOLN
INTRAMUSCULAR | Status: DC | PRN
  Administered 2020-06-08: 2 mg via INTRAVENOUS

## 2020-06-08 MED ORDER — CHLORHEXIDINE GLUCONATE CLOTH 2 % EX PADS: 6.0000 | MEDICATED_PAD | Freq: Once | CUTANEOUS | Status: DC

## 2020-06-08 MED ORDER — ACETAMINOPHEN 500 MG PO TABS
1000.0000 mg | ORAL_TABLET | Freq: Once | ORAL | Status: AC
  Administered 2020-06-08: 1000 mg via ORAL

## 2020-06-08 MED ORDER — PROPOFOL 10 MG/ML IV BOLUS
INTRAVENOUS | Status: DC | PRN
  Administered 2020-06-08: 160 mg via INTRAVENOUS

## 2020-06-08 MED ORDER — DEXAMETHASONE SODIUM PHOSPHATE 10 MG/ML IJ SOLN
INTRAMUSCULAR | Status: AC
  Filled 2020-06-08: qty 1

## 2020-06-08 MED ORDER — FENTANYL CITRATE (PF) 100 MCG/2ML IJ SOLN
INTRAMUSCULAR | Status: DC | PRN
  Administered 2020-06-08 (×2): 25 ug via INTRAVENOUS
  Administered 2020-06-08: 50 ug via INTRAVENOUS

## 2020-06-08 MED ORDER — CEFAZOLIN SODIUM-DEXTROSE 2-4 GM/100ML-% IV SOLN
2.0000 g | INTRAVENOUS | Status: AC
  Administered 2020-06-08: 2 g via INTRAVENOUS

## 2020-06-08 MED ORDER — BUPIVACAINE HCL 0.25 % IJ SOLN
INTRAMUSCULAR | Status: DC | PRN
  Administered 2020-06-08: 30 mL

## 2020-06-08 SURGICAL SUPPLY — 53 items
ADH SKN CLS APL DERMABOND .7 (GAUZE/BANDAGES/DRESSINGS) ×1
BAG DRN INLT TBG SET TISS ACC (MISCELLANEOUS) ×1
BINDER ABDOMINAL  9 SM 30-45 (SOFTGOODS)
BINDER ABDOMINAL 10 UNV 27-48 (MISCELLANEOUS) ×2 IMPLANT
BINDER ABDOMINAL 12 SM 30-45 (SOFTGOODS) IMPLANT
BINDER ABDOMINAL 9 SM 30-45 (SOFTGOODS) IMPLANT
BINDER BREAST LRG (GAUZE/BANDAGES/DRESSINGS) IMPLANT
BINDER BREAST MEDIUM (GAUZE/BANDAGES/DRESSINGS) IMPLANT
BINDER BREAST XLRG (GAUZE/BANDAGES/DRESSINGS) IMPLANT
BINDER BREAST XXLRG (GAUZE/BANDAGES/DRESSINGS) IMPLANT
BLADE HEX COATED 2.75 (ELECTRODE) IMPLANT
BLADE SURG 15 STRL LF DISP TIS (BLADE) ×1 IMPLANT
BLADE SURG 15 STRL SS (BLADE) ×3
BNDG GAUZE ELAST 4 BULKY (GAUZE/BANDAGES/DRESSINGS) ×6 IMPLANT
COVER BACK TABLE 60X90IN (DRAPES) ×3 IMPLANT
COVER MAYO STAND STRL (DRAPES) ×3 IMPLANT
COVER WAND RF STERILE (DRAPES) IMPLANT
DECANTER SPIKE VIAL GLASS SM (MISCELLANEOUS) IMPLANT
DERMABOND ADVANCED (GAUZE/BANDAGES/DRESSINGS) ×2
DERMABOND ADVANCED .7 DNX12 (GAUZE/BANDAGES/DRESSINGS) ×1 IMPLANT
DRAPE LAPAROSCOPIC ABDOMINAL (DRAPES) ×3 IMPLANT
DRSG PAD ABDOMINAL 8X10 ST (GAUZE/BANDAGES/DRESSINGS) ×6 IMPLANT
ELECT REM PT RETURN 9FT ADLT (ELECTROSURGICAL) ×3
ELECTRODE REM PT RTRN 9FT ADLT (ELECTROSURGICAL) ×1 IMPLANT
EXTRACTOR CANIST REVOLVE STRL (CANNISTER) ×1 IMPLANT
GLOVE BIO SURGEON STRL SZ 6.5 (GLOVE) ×4 IMPLANT
GLOVE BIO SURGEONS STRL SZ 6.5 (GLOVE) ×2
GOWN STRL REUS W/ TWL LRG LVL3 (GOWN DISPOSABLE) ×2 IMPLANT
GOWN STRL REUS W/TWL LRG LVL3 (GOWN DISPOSABLE) ×6
IV LACTATED RINGERS 1000ML (IV SOLUTION) ×6 IMPLANT
LINER CANISTER 1000CC FLEX (MISCELLANEOUS) ×3 IMPLANT
NDL HYPO 25X1 1.5 SAFETY (NEEDLE) IMPLANT
NDL SAFETY ECLIPSE 18X1.5 (NEEDLE) ×1 IMPLANT
NEEDLE HYPO 18GX1.5 SHARP (NEEDLE) ×3
NEEDLE HYPO 25X1 1.5 SAFETY (NEEDLE) IMPLANT
PACK BASIN DAY SURGERY FS (CUSTOM PROCEDURE TRAY) ×3 IMPLANT
PAD ALCOHOL SWAB (MISCELLANEOUS) ×3 IMPLANT
PENCIL SMOKE EVACUATOR (MISCELLANEOUS) IMPLANT
SLEEVE SCD COMPRESS KNEE MED (MISCELLANEOUS) ×3 IMPLANT
SPONGE LAP 18X18 RF (DISPOSABLE) ×3 IMPLANT
SUT MNCRL AB 4-0 PS2 18 (SUTURE) IMPLANT
SUT MON AB 5-0 PS2 18 (SUTURE) ×6 IMPLANT
SYR 10ML LL (SYRINGE) ×12 IMPLANT
SYR 3ML 18GX1 1/2 (SYRINGE) IMPLANT
SYR 50ML LL SCALE MARK (SYRINGE) ×6 IMPLANT
SYR CONTROL 10ML LL (SYRINGE) ×3 IMPLANT
SYR TOOMEY 50ML (SYRINGE) ×6 IMPLANT
SYSTEM FAT FILTRATION 250 (MISCELLANEOUS) ×2 IMPLANT
TOWEL GREEN STERILE FF (TOWEL DISPOSABLE) ×6 IMPLANT
TRAY DSU PREP LF (CUSTOM PROCEDURE TRAY) ×3 IMPLANT
TUBING INFILTRATION IT-10001 (TUBING) IMPLANT
TUBING SET GRADUATE ASPIR 12FT (MISCELLANEOUS) ×3 IMPLANT
UNDERPAD 30X36 HEAVY ABSORB (UNDERPADS AND DIAPERS) ×6 IMPLANT

## 2020-06-08 NOTE — Anesthesia Procedure Notes (Signed)
Procedure Name: LMA Insertion Date/Time: 06/08/2020 12:53 PM Performed by: Lavonia Dana, CRNA Pre-anesthesia Checklist: Patient identified, Emergency Drugs available, Suction available and Patient being monitored Patient Re-evaluated:Patient Re-evaluated prior to induction Oxygen Delivery Method: Circle system utilized Preoxygenation: Pre-oxygenation with 100% oxygen Induction Type: IV induction Ventilation: Mask ventilation without difficulty LMA: LMA inserted LMA Size: 4.0 Number of attempts: 1 Airway Equipment and Method: Bite block Placement Confirmation: positive ETCO2 Tube secured with: Tape Dental Injury: Teeth and Oropharynx as per pre-operative assessment

## 2020-06-08 NOTE — Discharge Instructions (Signed)
INSTRUCTIONS FOR AFTER SURGERY   You will likely have some questions about what to expect following your operation.  The following information will help you and your family understand what to expect when you are discharged from the hospital.  Following these guidelines will help ensure a smooth recovery and reduce risks of complications.  Postoperative instructions include information on: diet, wound care, medications and physical activity.  AFTER SURGERY Expect to go home after the procedure.  In some cases, you may need to spend one night in the hospital for observation.  DIET This surgery does not require a specific diet.  However, I have to mention that the healthier you eat the better your body can start healing. It is important to increasing your protein intake.  This means limiting the foods with added sugar.  Focus on fruits and vegetables and some meat. It is very important to drink water after your surgery.  If your urine is bright yellow, then it is concentrated, and you need to drink more water.  As a general rule after surgery, you should have 8 ounces of water every hour while awake.  If you find you are persistently nauseated or unable to take in liquids let us know.  NO TOBACCO USE or EXPOSURE.  This will slow your healing process and increase the risk of a wound.  WOUND CARE You can shower the day after surgery.  Use fragrance free soap.  Dial, Dove, Ivory and Cetaphil are usually mild on the skin.  If you have steri-strips / tape directly attached to your skin leave them in place. It is OK to get these wet.  No baths, pools or hot tubs for two weeks. We close your incision to leave the smallest and best-looking scar. No ointment or creams on your incisions until given the go ahead.  Especially not Neosporin (Too many skin reactions with this one).  A few weeks after surgery you can use Mederma and start massaging the scar. We ask you to wear your binder or sports bra for the first 6  weeks around the clock, including while sleeping. This provides added comfort and helps reduce the fluid accumulation at the surgery site.  ACTIVITY No heavy lifting until cleared by the doctor.  It is OK to walk and climb stairs. In fact, moving your legs is very important to decrease your risk of a blood clot.  It will also help keep you from getting deconditioned.  Every 1 to 2 hours get up and walk for 5 minutes. This will help with a quicker recovery back to normal.  Let pain be your guide so you don't do too much.  NO, you cannot do the spring cleaning and don't plan on taking care of anyone else.  This is your time for TLC.   WORK Everyone returns to work at different times. As a rough guide, most people take at least 1 - 2 weeks off prior to returning to work. If you need documentation for your job, bring the forms to your postoperative follow up visit.  DRIVING Arrange for someone to bring you home from the hospital.  You may be able to drive a few days after surgery but not while taking any narcotics or valium.  BOWEL MOVEMENTS Constipation can occur after anesthesia and while taking pain medication.  It is important to stay ahead for your comfort.  We recommend taking Milk of Magnesia (2 tablespoons; twice a day) while taking the pain pills.  SEROMA This   This is fluid your body tried to put in the surgical site.  This is normal but if it creates excessive pain and swelling let us know.  It usually decreases in a few weeks.  MEDICATIONS and PAIN CONTROL At your preoperative visit for you history and physical you were given the following medications: 1. An antibiotic: Start this medication when you get home and take according to the instructions on the bottle. 2. Zofran 4 mg:  This is to treat nausea and vomiting.  You can take this every 6 hours as needed and only if needed. 3. Norco (hydrocodone/acetaminophen) 5/325 mg:  This is only to be used after you have taken the motrin or the  tylenol. Every 8 hours as needed. Over the counter Medication to take: 4. Ibuprofen (Motrin) 600 mg:  Take this every 6 hours.  If you have additional pain then take 500 mg of the tylenol.  Only take the Norco after you have tried these two.  NEXT DOSE OF TYLENOL 6 P.M. 5. Miralax or stool softener of choice: Take this according to the bottle if you take the [place] Call your surgeon's office if any of the following occur: . Fever 101 degrees F or greater . Excessive bleeding or fluid from the incision site. . Pain that increases over time without aid from the medications . Redness, warmth, or pus draining from incision sites . Persistent nausea or inability to take in liquids . Severe misshapen area that underwent the operation.   Post Anesthesia Home Care Instructions  Activity: Get plenty of rest for the remainder of the day. A responsible individual must stay with you for 24 hours following the procedure.  For the next 24 hours, DO NOT: -Drive a car -Paediatric nurse -Drink alcoholic beverages -Take any medication unless instructed by your physician -Make any legal decisions or sign important papers.  Meals: Start with liquid foods such as gelatin or soup. Progress to regular foods as tolerated. Avoid greasy, spicy, heavy foods. If nausea and/or vomiting occur, drink only clear liquids until the nausea and/or vomiting subsides. Call your physician if vomiting continues.  Special Instructions/Symptoms: Your throat may feel dry or sore from the anesthesia or the breathing tube placed in your throat during surgery. If this causes discomfort, gargle with warm salt water. The discomfort should disappear within 24 hours.  If you had a scopolamine patch placed behind your ear for the management of post- operative nausea and/or vomiting:  1. The medication in the patch is effective for 72 hours, after which it should be removed.  Wrap patch in a tissue and discard in the  trash. Wash hands thoroughly with soap and water. 2. You may remove the patch earlier than 72 hours if you experience unpleasant side effects which may include dry mouth, dizziness or visual disturbances. 3. Avoid touching the patch. Wash your hands with soap and water after contact with the patch.

## 2020-06-08 NOTE — Anesthesia Postprocedure Evaluation (Signed)
Anesthesia Post Note  Patient: Nancy Beard  Procedure(s) Performed: LIPOSUCTION WITH LIPOFILLING (Bilateral Breast)     Patient location during evaluation: PACU Anesthesia Type: General Level of consciousness: awake and alert, oriented and awake Pain management: pain level controlled Vital Signs Assessment: post-procedure vital signs reviewed and stable Respiratory status: spontaneous breathing, nonlabored ventilation, respiratory function stable and patient connected to nasal cannula oxygen Cardiovascular status: blood pressure returned to baseline and stable Postop Assessment: no apparent nausea or vomiting Anesthetic complications: no   No complications documented.  Last Vitals:  Vitals:   06/08/20 1417 06/08/20 1430  BP:    Pulse: 90 84  Resp: 14 19  Temp:    SpO2: 99% 97%    Last Pain:  Vitals:   06/08/20 1405  TempSrc:   PainSc: 0-No pain                 Catalina Gravel

## 2020-06-08 NOTE — Op Note (Signed)
DATE OF OPERATION: 06/08/2020  LOCATION: [place]  PREOPERATIVE DIAGNOSIS: Breast asymmetry after treatment for breast cancer  POSTOPERATIVE DIAGNOSIS: Same  PROCEDURE: Lipofilling of breasts for improved symmetry   SURGEON: Yazid Pop Sanger Graci Hulce, DO  ASSISTANT: Roetta Sessions, PA  EBL: 5 cc  CONDITION: Stable  COMPLICATIONS: None  INDICATION: The patient, Nancy Beard, is a 64 y.o. female born on 12/11/1955, is here for treatment of breast asymmetry after mastectomy for treatment of breast cancer.   PROCEDURE DETAILS:  The patient was seen prior to surgery and marked.  The IV antibiotics were given. The patient was taken to the operating room and given a general anesthetic. A standard time out was performed and all information was confirmed by those in the room. SCDs were placed.   The chest and abdomen were prepped and draped.  Local was placed at the lower abdomen for two small incisions to be made and tumescent to be infused.  The liposuction was performed and the fat collected in the pure graft device.  The fat was prepared according to the manufacture guidelines.  The fat was then placed in 10 cc syringes and infused into the areas of the breast were volume was needed.  We placed 60 cc on the right and 40 cc on the left.  The incisions were closed with the 5-0 Monocryl.  The patient was allowed to wake up and taken to recovery room in stable condition at the end of the case. The family was notified at the end of the case.   The advanced practice practitioner (APP) assisted throughout the case.  The APP was essential in retraction and counter traction when needed to make the case progress smoothly.  This retraction and assistance made it possible to see the tissue plans for the procedure.  The assistance was needed for blood control, tissue re-approximation and assisted with closure of the incision site.

## 2020-06-08 NOTE — Interval H&P Note (Signed)
History and Physical Interval Note:  06/08/2020 12:14 PM  Nancy Beard  has presented today for surgery, with the diagnosis of history of breast cancer.  The various methods of treatment have been discussed with the patient and family. After consideration of risks, benefits and other options for treatment, the patient has consented to  Procedure(s) with comments: LIPOSUCTION WITH LIPOFILLING (Bilateral) - 90 min, please as a surgical intervention.  The patient's history has been reviewed, patient examined, no change in status, stable for surgery.  I have reviewed the patient's chart and labs.  Questions were answered to the patient's satisfaction.     Loel Lofty Dontell Mian

## 2020-06-08 NOTE — Transfer of Care (Signed)
Immediate Anesthesia Transfer of Care Note  Patient: Nancy Beard  Procedure(s) Performed: LIPOSUCTION WITH LIPOFILLING (Bilateral Breast)  Patient Location: PACU  Anesthesia Type:General  Level of Consciousness: drowsy  Airway & Oxygen Therapy: Patient Spontanous Breathing and Patient connected to face mask oxygen  Post-op Assessment: Report given to RN and Post -op Vital signs reviewed and stable  Post vital signs: Reviewed and stable  Last Vitals:  Vitals Value Taken Time  BP 180/100 06/08/20 1405  Temp    Pulse 105 06/08/20 1407  Resp 12 06/08/20 1407  SpO2 97 % 06/08/20 1407  Vitals shown include unvalidated device data.  Last Pain:  Vitals:   06/08/20 1142  TempSrc: Oral         Complications: No complications documented.

## 2020-06-08 NOTE — Anesthesia Preprocedure Evaluation (Addendum)
Anesthesia Evaluation  Patient identified by MRN, date of birth, ID band Patient awake    Reviewed: Allergy & Precautions, NPO status , Patient's Chart, lab work & pertinent test results  Airway Mallampati: II  TM Distance: >3 FB Neck ROM: Full    Dental  (+) Teeth Intact   Pulmonary neg pulmonary ROS,    Pulmonary exam normal breath sounds clear to auscultation       Cardiovascular + DVT  Normal cardiovascular exam Rhythm:Regular Rate:Normal     Neuro/Psych  Headaches, PSYCHIATRIC DISORDERS Anxiety Depression  Neuromuscular disease    GI/Hepatic Neg liver ROS, Celiac   Endo/Other  Hypothyroidism   Renal/GU negative Renal ROS     Musculoskeletal negative musculoskeletal ROS (+)   Abdominal   Peds  Hematology negative hematology ROS (+)   Anesthesia Other Findings Day of surgery medications reviewed with the patient.  H/o breast cancer   Reproductive/Obstetrics                             Anesthesia Physical Anesthesia Plan  ASA: II  Anesthesia Plan: General   Post-op Pain Management:    Induction: Intravenous  PONV Risk Score and Plan: 3 and Midazolam, Dexamethasone and Ondansetron  Airway Management Planned: LMA  Additional Equipment:   Intra-op Plan:   Post-operative Plan: Extubation in OR  Informed Consent: I have reviewed the patients History and Physical, chart, labs and discussed the procedure including the risks, benefits and alternatives for the proposed anesthesia with the patient or authorized representative who has indicated his/her understanding and acceptance.       Plan Discussed with: CRNA  Anesthesia Plan Comments:         Anesthesia Quick Evaluation

## 2020-06-13 ENCOUNTER — Other Ambulatory Visit: Payer: Self-pay

## 2020-06-13 ENCOUNTER — Encounter (HOSPITAL_BASED_OUTPATIENT_CLINIC_OR_DEPARTMENT_OTHER): Payer: Self-pay | Admitting: Plastic Surgery

## 2020-06-13 ENCOUNTER — Ambulatory Visit (AMBULATORY_SURGERY_CENTER): Payer: Self-pay | Admitting: *Deleted

## 2020-06-13 VITALS — Ht 65.75 in | Wt 177.0 lb

## 2020-06-13 DIAGNOSIS — Z1211 Encounter for screening for malignant neoplasm of colon: Secondary | ICD-10-CM

## 2020-06-13 NOTE — Progress Notes (Signed)
Patient is here in-person for PV. Patient denies any allergies to eggs or soy. Patient denies any problems with anesthesia/sedation. Patient denies any oxygen use at home. Patient denies taking any diet/weight loss medications or blood thinners. Patient is not being treated for MRSA or C-diff. Patient is aware of our care-partner policy and [facility] safety protocol. EMMI education assigned to the patient for the procedure, sent to [facility].   COVID-19 vaccines completed on 05/13/2020 booster, per patient.

## 2020-06-14 ENCOUNTER — Ambulatory Visit (INDEPENDENT_AMBULATORY_CARE_PROVIDER_SITE_OTHER): Payer: BC Managed Care – PPO | Admitting: Plastic Surgery

## 2020-06-14 ENCOUNTER — Encounter: Payer: Self-pay | Admitting: Plastic Surgery

## 2020-06-14 ENCOUNTER — Other Ambulatory Visit: Payer: Self-pay

## 2020-06-14 DIAGNOSIS — N651 Disproportion of reconstructed breast: Secondary | ICD-10-CM

## 2020-06-14 NOTE — Progress Notes (Signed)
The patient is a 64 year old female here for follow-up after undergoing Lipo filling of her breasts last week.  She is bruised and sore in her abdomen.  Her breasts have filled in very nicely.  The bruising and swelling of the abdomen and breasts is as expected.  There is no sign of infection.  No sign of seroma or hematoma.  Incisions are healing nicely.  Continue with sports bra and spanks.  Can shower.  Would like to see her back in 2 weeks.

## 2020-06-16 ENCOUNTER — Encounter: Payer: Self-pay | Admitting: Internal Medicine

## 2020-06-29 ENCOUNTER — Encounter: Payer: Self-pay | Admitting: Internal Medicine

## 2020-06-29 ENCOUNTER — Other Ambulatory Visit: Payer: Self-pay

## 2020-06-29 ENCOUNTER — Ambulatory Visit (AMBULATORY_SURGERY_CENTER): Payer: BC Managed Care – PPO | Admitting: Internal Medicine

## 2020-06-29 VITALS — BP 122/54 | HR 76 | Temp 97.5°F | Resp 22 | Ht 65.75 in | Wt 177.0 lb

## 2020-06-29 DIAGNOSIS — Z1211 Encounter for screening for malignant neoplasm of colon: Secondary | ICD-10-CM | POA: Diagnosis not present

## 2020-06-29 DIAGNOSIS — K635 Polyp of colon: Secondary | ICD-10-CM

## 2020-06-29 DIAGNOSIS — D123 Benign neoplasm of transverse colon: Secondary | ICD-10-CM

## 2020-06-29 MED ORDER — SODIUM CHLORIDE 0.9 % IV SOLN: 500.0000 mL | Freq: Once | INTRAVENOUS | Status: DC

## 2020-06-29 NOTE — Progress Notes (Signed)
A/ox3, pleased with MAC, report to RN 

## 2020-06-29 NOTE — Op Note (Signed)
[facility] Patient Name: Nancy Beard Procedure Date: 06/29/2020 9:41 AM MRN: [id number] Endoscopist: Gatha Mayer , MD Age: 64 Referring MD:  Date of Birth: Aug 29, 1955 Gender: Female Account #: [id number] Procedure:                Colonoscopy Indications:              Screening for colorectal malignant neoplasm Medicines:                Propofol per Anesthesia, Monitored Anesthesia Care Procedure:                Pre-Anesthesia Assessment:                           - Prior to the procedure, a History and Physical                            was performed, and patient medications and                            allergies were reviewed. The patient's tolerance of                            previous anesthesia was also reviewed. The risks                            and benefits of the procedure and the sedation                            options and risks were discussed with the patient.                            All questions were answered, and informed consent                            was obtained. Prior Anticoagulants: The patient has                            taken no previous anticoagulant or antiplatelet                            agents. ASA Grade Assessment: II - A patient with                            mild systemic disease. After reviewing the risks                            and benefits, the patient was deemed in                            satisfactory condition to undergo the procedure.                           After obtaining informed consent, the colonoscope  was passed under direct vision. Throughout the                            procedure, the patient's blood pressure, pulse, and                            oxygen saturations were monitored continuously. The                            Colonoscope was introduced through the anus and                            advanced to the the cecum, identified by                             appendiceal orifice and ileocecal valve. The                            colonoscopy was somewhat difficult due to                            significant looping. Successful completion of the                            procedure was aided by applying abdominal pressure.                            The patient tolerated the procedure well. The                            quality of the bowel preparation was excellent. The                            ileocecal valve, appendiceal orifice, and rectum                            were photographed. The bowel preparation used was                            Miralax via split dose instruction. Scope In: 10:03:26 AM Scope Out: 10:27:04 AM Scope Withdrawal Time: 0 hours 15 minutes 50 seconds  Total Procedure Duration: 0 hours 23 minutes 38 seconds  Findings:                 The perianal and digital rectal examinations were                            normal.                           A diminutive polyp was found in the transverse                            colon. The polyp was sessile. The polyp was removed  with a cold snare. Resection and retrieval were                            complete. Verification of patient identification                            for the specimen was done. Estimated blood loss was                            minimal. Estimated blood loss was minimal.                           A few diverticula were found in the sigmoid colon.                           The exam was otherwise without abnormality on                            direct and retroflexion views. Complications:            No immediate complications. Estimated Blood Loss:     Estimated blood loss was minimal. Impression:               - One diminutive polyp in the transverse colon,                            removed with a cold snare. Resected and retrieved.                           - Diverticulosis in the sigmoid colon.                            - The examination was otherwise normal on direct                            and retroflexion views. Recommendation:           - Patient has a contact number available for                            emergencies. The signs and symptoms of potential                            delayed complications were discussed with the                            patient. Return to normal activities tomorrow.                            Written discharge instructions were provided to the                            patient.                           - Resume previous  diet.                           - Continue present medications.                           - Await pathology results.                           - Repeat colonoscopy is recommended. The                            colonoscopy date will be determined after pathology                            results from today's exam become available for                            review. Gatha Mayer, MD 06/29/2020 10:36:09 AM This report has been signed electronically.

## 2020-06-29 NOTE — Progress Notes (Signed)
VS taken by MO 

## 2020-06-29 NOTE — Progress Notes (Signed)
Pt's states no medical or surgical changes since previsit or office visit. 

## 2020-06-29 NOTE — Patient Instructions (Addendum)
I found and removed one tiny polyp that I feel certain is benign.  It will get analyzed and I will let you know what it was and what that means as far as repeating a colonoscopy.  You also have some diverticulosis, little pouches or pockets in the colon.  This is a very common condition and usually does not lead to problems even though you hear a lot about it.  Please read the handout provided.  Please keep working on your gluten-free diet.  I know that it is difficult but it is really in your best interest as far as your health is concerned.  I appreciate the opportunity to care for you.  Gatha Mayer, MD, FACG   YOU HAD AN ENDOSCOPIC PROCEDURE TODAY AT [facility]:   Refer to the procedure report that was given to you for any specific questions about what was found during the examination.  If the procedure report does not answer your questions, please call your gastroenterologist to clarify.  If you requested that your care partner not be given the details of your procedure findings, then the procedure report has been included in a sealed envelope for you to review at your convenience later.  Try to stick to your gluten free diet.  It is very important.  Read all of the handout  YOU SHOULD EXPECT: Some feelings of bloating in the abdomen. Passage of more gas than usual.  Walking can help get rid of the air that was put into your GI tract during the procedure and reduce the bloating. If you had a lower endoscopy (such as a colonoscopy or flexible sigmoidoscopy) you may notice spotting of blood in your stool or on the toilet paper. If you underwent a bowel prep for your procedure, you may not have a normal bowel movement for a few days.  Please Note:  You might notice some irritation and congestion in your nose or some drainage.  This is from the oxygen used during your procedure.  There is no need for concern and it should clear up in a day or so.  SYMPTOMS TO REPORT  IMMEDIATELY:   Following lower endoscopy (colonoscopy or flexible sigmoidoscopy):  Excessive amounts of blood in the stool  Significant tenderness or worsening of abdominal pains  Swelling of the abdomen that is new, acute  Fever of 100F or higher   For urgent or emergent issues, a gastroenterologist can be reached at any hour by calling [phone]. Do not use MyChart messaging for urgent concerns.    DIET:  We do recommend a small meal at first, but then you may proceed to your regular diet.  Drink plenty of fluids but you should avoid alcoholic beverages for 24 hours.  ACTIVITY:  You should plan to take it easy for the rest of today and you should NOT DRIVE or use heavy machinery until tomorrow (because of the sedation medicines used during the test).    FOLLOW UP: Our staff will call the number listed on your records 48-72 hours following your procedure to check on you and address any questions or concerns that you may have regarding the information given to you following your procedure. If we do not reach you, we will leave a message.  We will attempt to reach you two times.  During this call, we will ask if you have developed any symptoms of COVID 19. If you develop any symptoms (ie: fever, flu-like symptoms, shortness of breath, cough etc.)  before then, please call [phone].  If you test positive for Covid 19 in the 2 weeks post procedure, please call and report this information to [place].    If any biopsies were taken you will be contacted by phone or by letter within the next 1-3 weeks.  Please call us at [phone] if you have not heard about the biopsies in 3 weeks.    SIGNATURES/CONFIDENTIALITY: You and/or your care partner have signed paperwork which will be entered into your electronic medical record.  These signatures attest to the fact that that the information above on your After Visit Summary has been reviewed and is understood.  Full responsibility of the  confidentiality of this discharge information lies with you and/or your care-partner.

## 2020-06-29 NOTE — Progress Notes (Signed)
Called to room to assist during endoscopic procedure.  Patient ID and intended procedure confirmed with present staff. Received instructions for my participation in the procedure from the performing physician.  

## 2020-07-01 ENCOUNTER — Telehealth: Payer: Self-pay

## 2020-07-01 ENCOUNTER — Telehealth: Payer: Self-pay | Admitting: *Deleted

## 2020-07-01 NOTE — Telephone Encounter (Signed)
  Follow up Call-  Call back number 06/29/2020  Post procedure Call Back phone  # [phone]  Permission to leave phone message Yes  Some recent data might be hidden     Patient questions:  Do you have a fever, pain , or abdominal swelling? No. Pain Score  0 *  Have you tolerated food without any problems? Yes.    Have you been able to return to your normal activities? Yes.    Do you have any questions about your discharge instructions: Diet   No. Medications  No. Follow up visit  No.  Do you have questions or concerns about your Care? No.  Actions: * If pain score is 4 or above: No action needed, pain <4.  1. Have you developed a fever since your procedure? no  2.   Have you had an respiratory symptoms (SOB or cough) since your procedure? no  3.   Have you tested positive for COVID 19 since your procedure no  4.   Have you had any family members/close contacts diagnosed with the COVID 19 since your procedure?  no   If yes to any of these questions please route to Joylene John, RN and Joella Prince, RN

## 2020-07-01 NOTE — Telephone Encounter (Signed)
Left message on answering machine. 

## 2020-07-05 ENCOUNTER — Ambulatory Visit (INDEPENDENT_AMBULATORY_CARE_PROVIDER_SITE_OTHER): Payer: BC Managed Care – PPO | Admitting: Plastic Surgery

## 2020-07-05 ENCOUNTER — Other Ambulatory Visit: Payer: Self-pay

## 2020-07-05 ENCOUNTER — Encounter: Payer: Self-pay | Admitting: Plastic Surgery

## 2020-07-05 VITALS — BP 153/93 | HR 93 | Temp 98.3°F

## 2020-07-05 DIAGNOSIS — N651 Disproportion of reconstructed breast: Secondary | ICD-10-CM

## 2020-07-05 NOTE — Progress Notes (Signed)
The patient is a 64 year old female here for follow-up after undergoing Lipo filling of bilateral breasts.  She is doing extremely well.  The bruising and swelling has resolved.  She has good contour and shape of her breasts.  Continue with sports bra.  Follow-up in next several months and we will arrange for nipple areola tattooing with [facility].

## 2020-07-06 ENCOUNTER — Encounter: Payer: Self-pay | Admitting: Internal Medicine

## 2020-08-08 ENCOUNTER — Other Ambulatory Visit: Payer: Self-pay

## 2020-08-08 ENCOUNTER — Ambulatory Visit: Payer: BC Managed Care – PPO

## 2020-08-08 VITALS — BP 138/90 | HR 88 | Temp 96.8°F | Ht 64.0 in | Wt 175.0 lb

## 2020-08-08 DIAGNOSIS — Z9013 Acquired absence of bilateral breasts and nipples: Secondary | ICD-10-CM | POA: Diagnosis not present

## 2020-08-14 ENCOUNTER — Other Ambulatory Visit: Payer: Self-pay | Admitting: Osteopathic Medicine

## 2020-08-18 DIAGNOSIS — H10022 Other mucopurulent conjunctivitis, left eye: Secondary | ICD-10-CM | POA: Diagnosis not present

## 2020-08-29 DIAGNOSIS — Z853 Personal history of malignant neoplasm of breast: Secondary | ICD-10-CM | POA: Diagnosis not present

## 2020-08-29 DIAGNOSIS — Z79811 Long term (current) use of aromatase inhibitors: Secondary | ICD-10-CM | POA: Diagnosis not present

## 2020-08-29 DIAGNOSIS — R937 Abnormal findings on diagnostic imaging of other parts of musculoskeletal system: Secondary | ICD-10-CM | POA: Diagnosis not present

## 2020-08-29 DIAGNOSIS — Z20822 Contact with and (suspected) exposure to covid-19: Secondary | ICD-10-CM | POA: Diagnosis not present

## 2020-08-29 DIAGNOSIS — Y999 Unspecified external cause status: Secondary | ICD-10-CM | POA: Diagnosis not present

## 2020-08-29 DIAGNOSIS — S298XXA Other specified injuries of thorax, initial encounter: Secondary | ICD-10-CM | POA: Diagnosis not present

## 2020-08-29 DIAGNOSIS — W1839XA Other fall on same level, initial encounter: Secondary | ICD-10-CM | POA: Diagnosis not present

## 2020-08-29 DIAGNOSIS — Z7901 Long term (current) use of anticoagulants: Secondary | ICD-10-CM | POA: Diagnosis not present

## 2020-08-29 DIAGNOSIS — S32012A Unstable burst fracture of first lumbar vertebra, initial encounter for closed fracture: Secondary | ICD-10-CM | POA: Diagnosis not present

## 2020-08-29 DIAGNOSIS — S3993XA Unspecified injury of pelvis, initial encounter: Secondary | ICD-10-CM | POA: Diagnosis not present

## 2020-08-29 DIAGNOSIS — S32018A Other fracture of first lumbar vertebra, initial encounter for closed fracture: Secondary | ICD-10-CM | POA: Diagnosis not present

## 2020-08-29 DIAGNOSIS — I10 Essential (primary) hypertension: Secondary | ICD-10-CM | POA: Diagnosis not present

## 2020-08-29 DIAGNOSIS — S066X9A Traumatic subarachnoid hemorrhage with loss of consciousness of unspecified duration, initial encounter: Secondary | ICD-10-CM | POA: Diagnosis not present

## 2020-08-29 DIAGNOSIS — S066X0A Traumatic subarachnoid hemorrhage without loss of consciousness, initial encounter: Secondary | ICD-10-CM | POA: Diagnosis not present

## 2020-08-29 DIAGNOSIS — R000 Tachycardia, unspecified: Secondary | ICD-10-CM | POA: Diagnosis not present

## 2020-08-29 DIAGNOSIS — Z043 Encounter for examination and observation following other accident: Secondary | ICD-10-CM | POA: Diagnosis not present

## 2020-08-29 DIAGNOSIS — E039 Hypothyroidism, unspecified: Secondary | ICD-10-CM | POA: Diagnosis not present

## 2020-08-29 DIAGNOSIS — Z7983 Long term (current) use of bisphosphonates: Secondary | ICD-10-CM | POA: Diagnosis not present

## 2020-08-29 DIAGNOSIS — S0003XA Contusion of scalp, initial encounter: Secondary | ICD-10-CM | POA: Diagnosis not present

## 2020-08-29 DIAGNOSIS — K900 Celiac disease: Secondary | ICD-10-CM | POA: Diagnosis not present

## 2020-08-29 DIAGNOSIS — E559 Vitamin D deficiency, unspecified: Secondary | ICD-10-CM | POA: Diagnosis not present

## 2020-08-29 DIAGNOSIS — R55 Syncope and collapse: Secondary | ICD-10-CM | POA: Diagnosis not present

## 2020-08-29 DIAGNOSIS — R0689 Other abnormalities of breathing: Secondary | ICD-10-CM | POA: Diagnosis not present

## 2020-08-29 DIAGNOSIS — F0781 Postconcussional syndrome: Secondary | ICD-10-CM | POA: Diagnosis not present

## 2020-08-29 DIAGNOSIS — Z8249 Family history of ischemic heart disease and other diseases of the circulatory system: Secondary | ICD-10-CM | POA: Diagnosis not present

## 2020-08-29 DIAGNOSIS — R410 Disorientation, unspecified: Secondary | ICD-10-CM | POA: Diagnosis not present

## 2020-08-29 DIAGNOSIS — G4700 Insomnia, unspecified: Secondary | ICD-10-CM | POA: Diagnosis not present

## 2020-08-29 DIAGNOSIS — S32011A Stable burst fracture of first lumbar vertebra, initial encounter for closed fracture: Secondary | ICD-10-CM | POA: Diagnosis not present

## 2020-08-29 DIAGNOSIS — W19XXXA Unspecified fall, initial encounter: Secondary | ICD-10-CM | POA: Insufficient documentation

## 2020-08-29 DIAGNOSIS — D62 Acute posthemorrhagic anemia: Secondary | ICD-10-CM | POA: Diagnosis not present

## 2020-08-29 DIAGNOSIS — S32001A Stable burst fracture of unspecified lumbar vertebra, initial encounter for closed fracture: Secondary | ICD-10-CM | POA: Diagnosis not present

## 2020-08-29 DIAGNOSIS — Z86718 Personal history of other venous thrombosis and embolism: Secondary | ICD-10-CM | POA: Diagnosis not present

## 2020-08-29 DIAGNOSIS — M549 Dorsalgia, unspecified: Secondary | ICD-10-CM | POA: Diagnosis not present

## 2020-08-29 DIAGNOSIS — R9431 Abnormal electrocardiogram [ECG] [EKG]: Secondary | ICD-10-CM | POA: Diagnosis not present

## 2020-08-29 DIAGNOSIS — R58 Hemorrhage, not elsewhere classified: Secondary | ICD-10-CM | POA: Diagnosis not present

## 2020-08-29 DIAGNOSIS — M79671 Pain in right foot: Secondary | ICD-10-CM | POA: Diagnosis not present

## 2020-08-29 DIAGNOSIS — S32001D Stable burst fracture of unspecified lumbar vertebra, subsequent encounter for fracture with routine healing: Secondary | ICD-10-CM | POA: Diagnosis not present

## 2020-08-29 DIAGNOSIS — R339 Retention of urine, unspecified: Secondary | ICD-10-CM | POA: Diagnosis not present

## 2020-08-29 DIAGNOSIS — S199XXA Unspecified injury of neck, initial encounter: Secondary | ICD-10-CM | POA: Diagnosis not present

## 2020-08-30 DIAGNOSIS — S32001A Stable burst fracture of unspecified lumbar vertebra, initial encounter for closed fracture: Secondary | ICD-10-CM | POA: Insufficient documentation

## 2020-08-30 DIAGNOSIS — W19XXXA Unspecified fall, initial encounter: Secondary | ICD-10-CM | POA: Diagnosis not present

## 2020-08-30 DIAGNOSIS — W109XXA Fall (on) (from) unspecified stairs and steps, initial encounter: Secondary | ICD-10-CM | POA: Insufficient documentation

## 2020-08-30 DIAGNOSIS — S066X9A Traumatic subarachnoid hemorrhage with loss of consciousness of unspecified duration, initial encounter: Secondary | ICD-10-CM | POA: Diagnosis not present

## 2020-08-31 NOTE — Progress Notes (Signed)
NIPPLE AREOLAR TATTOO PROCEDURE  PREOPERATIVE DIAGNOSIS:  Acquired absence of (BILATERAL) nipple areolar complex  POSTOPERATIVE DIAGNOSIS: Acquired absence of (BILATERAL) nipple areolar    PROCEDURES: (BILATERAL) nipple areolar tattoo   ATTENDING SURGEON: Dr. Lyndee Leo Dillingham  ANESTHESIA:  EMLA applied 30 mins prior to procedure  COMPLICATIONS: None.  JUSTIFICATION FOR PROCEDURE:  Nancy Beard is a 65 y.o. female with a history of breast cancer status post bilateral breast reconstruction. The patient presents for bilateral nipple areolar complex tattoo. Risks, benefits, indications, and alternatives of the above described procedures were discussed with the patient and all the patient's questions were answered.   DESCRIPTION OF PROCEDURE: After informed consent was obtained and proper identification of patient and surgical site was made, the patient was taken to the procedure room and a pre-procedure photo was taken & entered into chart. Placement/size/color & shape were chosen by pt. Pt was then placed supine on the operating room table. A time out was performed to confirm patient's identity and surgical site. The patient was prepped and draped in the usual sterile fashion.   Using a # 7 tattoo needle pigment was instilled to the designed nipple areolar complex.  Once adequate pigment had been applied to the nipple areolar complex, a post-procedure photo taken.  vaseline/xeroform & gauze dressing was applied. The patient tolerated the procedure well.   Tattoo ink used: [employer] Ink: [name] Skin- ZDG#[id number] / exp 02/28/23 Warm Mink- GLO#[id number] / exp 06/24/2022 Cool Honey CZY#[id number] / exp 04/14/22 Jeni Salles ATF#[id number] / exp 06/25/22 Fair [place]ach lot#[id number] / exp 06/24/22 Duration lot# [id number] / exp 3/23 was used for anesthesia as needed. Reviewed post procedure instructions with pt

## 2020-08-31 NOTE — Patient Instructions (Signed)
Pt is instructed to use vaseline/xeroform & gauze dressing for 1-2 weeks. She is reminded that the colors/shades may fade up to 40%- which would require future touch-up treatments. She understands that the areas may be tender/swollen & may be red & reactive to the procedure - she can use OTC pain meds of her choice for the discomfort. She will call for any concerns or questions- otherwise she will f/u in approx 6 weeks

## 2020-09-02 ENCOUNTER — Telehealth: Payer: Self-pay | Admitting: Plastic Surgery

## 2020-09-02 NOTE — Telephone Encounter (Signed)
error 

## 2020-09-05 DIAGNOSIS — S066X9D Traumatic subarachnoid hemorrhage with loss of consciousness of unspecified duration, subsequent encounter: Secondary | ICD-10-CM | POA: Diagnosis not present

## 2020-09-05 DIAGNOSIS — G8222 Paraplegia, incomplete: Secondary | ICD-10-CM | POA: Insufficient documentation

## 2020-09-05 DIAGNOSIS — R7401 Elevation of levels of liver transaminase levels: Secondary | ICD-10-CM | POA: Diagnosis not present

## 2020-09-05 DIAGNOSIS — E039 Hypothyroidism, unspecified: Secondary | ICD-10-CM | POA: Diagnosis not present

## 2020-09-05 DIAGNOSIS — S32001D Stable burst fracture of unspecified lumbar vertebra, subsequent encounter for fracture with routine healing: Secondary | ICD-10-CM | POA: Diagnosis not present

## 2020-09-05 DIAGNOSIS — N319 Neuromuscular dysfunction of bladder, unspecified: Secondary | ICD-10-CM | POA: Diagnosis not present

## 2020-09-05 DIAGNOSIS — K5900 Constipation, unspecified: Secondary | ICD-10-CM | POA: Diagnosis not present

## 2020-09-05 DIAGNOSIS — W109XXD Fall (on) (from) unspecified stairs and steps, subsequent encounter: Secondary | ICD-10-CM | POA: Diagnosis not present

## 2020-09-05 DIAGNOSIS — K592 Neurogenic bowel, not elsewhere classified: Secondary | ICD-10-CM | POA: Diagnosis not present

## 2020-09-05 DIAGNOSIS — E871 Hypo-osmolality and hyponatremia: Secondary | ICD-10-CM | POA: Diagnosis not present

## 2020-09-05 DIAGNOSIS — R6889 Other general symptoms and signs: Secondary | ICD-10-CM | POA: Diagnosis not present

## 2020-09-05 DIAGNOSIS — Z7409 Other reduced mobility: Secondary | ICD-10-CM | POA: Diagnosis not present

## 2020-09-05 DIAGNOSIS — Z981 Arthrodesis status: Secondary | ICD-10-CM | POA: Diagnosis not present

## 2020-09-05 DIAGNOSIS — F32A Depression, unspecified: Secondary | ICD-10-CM | POA: Diagnosis not present

## 2020-09-05 DIAGNOSIS — S32001A Stable burst fracture of unspecified lumbar vertebra, initial encounter for closed fracture: Secondary | ICD-10-CM | POA: Diagnosis not present

## 2020-09-05 DIAGNOSIS — S32011D Stable burst fracture of first lumbar vertebra, subsequent encounter for fracture with routine healing: Secondary | ICD-10-CM | POA: Diagnosis not present

## 2020-09-14 DIAGNOSIS — N319 Neuromuscular dysfunction of bladder, unspecified: Secondary | ICD-10-CM | POA: Insufficient documentation

## 2020-09-15 DIAGNOSIS — Z981 Arthrodesis status: Secondary | ICD-10-CM | POA: Insufficient documentation

## 2020-09-26 ENCOUNTER — Telehealth: Payer: Self-pay

## 2020-09-26 NOTE — Telephone Encounter (Signed)
[name] from [facility] called to verify PCP and ability to follow up on [facility] and PT.

## 2020-09-30 DIAGNOSIS — K5730 Diverticulosis of large intestine without perforation or abscess without bleeding: Secondary | ICD-10-CM | POA: Diagnosis not present

## 2020-09-30 DIAGNOSIS — K449 Diaphragmatic hernia without obstruction or gangrene: Secondary | ICD-10-CM | POA: Diagnosis not present

## 2020-09-30 DIAGNOSIS — Z86718 Personal history of other venous thrombosis and embolism: Secondary | ICD-10-CM | POA: Diagnosis not present

## 2020-09-30 DIAGNOSIS — W108XXD Fall (on) (from) other stairs and steps, subsequent encounter: Secondary | ICD-10-CM | POA: Diagnosis not present

## 2020-09-30 DIAGNOSIS — S32012D Unstable burst fracture of first lumbar vertebra, subsequent encounter for fracture with routine healing: Secondary | ICD-10-CM | POA: Diagnosis not present

## 2020-09-30 DIAGNOSIS — S065X0D Traumatic subdural hemorrhage without loss of consciousness, subsequent encounter: Secondary | ICD-10-CM | POA: Diagnosis not present

## 2020-09-30 DIAGNOSIS — K529 Noninfective gastroenteritis and colitis, unspecified: Secondary | ICD-10-CM | POA: Diagnosis not present

## 2020-09-30 DIAGNOSIS — F32A Depression, unspecified: Secondary | ICD-10-CM | POA: Diagnosis not present

## 2020-09-30 DIAGNOSIS — E039 Hypothyroidism, unspecified: Secondary | ICD-10-CM | POA: Diagnosis not present

## 2020-09-30 DIAGNOSIS — M5030 Other cervical disc degeneration, unspecified cervical region: Secondary | ICD-10-CM | POA: Diagnosis not present

## 2020-09-30 DIAGNOSIS — N319 Neuromuscular dysfunction of bladder, unspecified: Secondary | ICD-10-CM | POA: Diagnosis not present

## 2020-09-30 DIAGNOSIS — R911 Solitary pulmonary nodule: Secondary | ICD-10-CM | POA: Diagnosis not present

## 2020-10-03 DIAGNOSIS — N390 Urinary tract infection, site not specified: Secondary | ICD-10-CM | POA: Diagnosis not present

## 2020-10-03 DIAGNOSIS — S066X9D Traumatic subarachnoid hemorrhage with loss of consciousness of unspecified duration, subsequent encounter: Secondary | ICD-10-CM | POA: Diagnosis not present

## 2020-10-03 DIAGNOSIS — T83511A Infection and inflammatory reaction due to indwelling urethral catheter, initial encounter: Secondary | ICD-10-CM | POA: Diagnosis not present

## 2020-10-03 DIAGNOSIS — W109XXD Fall (on) (from) unspecified stairs and steps, subsequent encounter: Secondary | ICD-10-CM | POA: Diagnosis not present

## 2020-10-03 DIAGNOSIS — B9620 Unspecified Escherichia coli [E. coli] as the cause of diseases classified elsewhere: Secondary | ICD-10-CM | POA: Diagnosis not present

## 2020-10-03 DIAGNOSIS — S066X0D Traumatic subarachnoid hemorrhage without loss of consciousness, subsequent encounter: Secondary | ICD-10-CM | POA: Diagnosis not present

## 2020-10-03 DIAGNOSIS — Y846 Urinary catheterization as the cause of abnormal reaction of the patient, or of later complication, without mention of misadventure at the time of the procedure: Secondary | ICD-10-CM | POA: Diagnosis not present

## 2020-10-03 DIAGNOSIS — W19XXXD Unspecified fall, subsequent encounter: Secondary | ICD-10-CM | POA: Diagnosis not present

## 2020-10-04 DIAGNOSIS — K449 Diaphragmatic hernia without obstruction or gangrene: Secondary | ICD-10-CM | POA: Diagnosis not present

## 2020-10-04 DIAGNOSIS — F32A Depression, unspecified: Secondary | ICD-10-CM | POA: Diagnosis not present

## 2020-10-04 DIAGNOSIS — E039 Hypothyroidism, unspecified: Secondary | ICD-10-CM | POA: Diagnosis not present

## 2020-10-04 DIAGNOSIS — W108XXD Fall (on) (from) other stairs and steps, subsequent encounter: Secondary | ICD-10-CM | POA: Diagnosis not present

## 2020-10-04 DIAGNOSIS — M5030 Other cervical disc degeneration, unspecified cervical region: Secondary | ICD-10-CM | POA: Diagnosis not present

## 2020-10-04 DIAGNOSIS — K5730 Diverticulosis of large intestine without perforation or abscess without bleeding: Secondary | ICD-10-CM | POA: Diagnosis not present

## 2020-10-04 DIAGNOSIS — K529 Noninfective gastroenteritis and colitis, unspecified: Secondary | ICD-10-CM | POA: Diagnosis not present

## 2020-10-04 DIAGNOSIS — Z86718 Personal history of other venous thrombosis and embolism: Secondary | ICD-10-CM | POA: Diagnosis not present

## 2020-10-04 DIAGNOSIS — S32012D Unstable burst fracture of first lumbar vertebra, subsequent encounter for fracture with routine healing: Secondary | ICD-10-CM | POA: Diagnosis not present

## 2020-10-04 DIAGNOSIS — R911 Solitary pulmonary nodule: Secondary | ICD-10-CM | POA: Diagnosis not present

## 2020-10-04 DIAGNOSIS — S065X0D Traumatic subdural hemorrhage without loss of consciousness, subsequent encounter: Secondary | ICD-10-CM | POA: Diagnosis not present

## 2020-10-04 DIAGNOSIS — N319 Neuromuscular dysfunction of bladder, unspecified: Secondary | ICD-10-CM | POA: Diagnosis not present

## 2020-10-07 DIAGNOSIS — E039 Hypothyroidism, unspecified: Secondary | ICD-10-CM | POA: Diagnosis not present

## 2020-10-07 DIAGNOSIS — M5030 Other cervical disc degeneration, unspecified cervical region: Secondary | ICD-10-CM | POA: Diagnosis not present

## 2020-10-07 DIAGNOSIS — K449 Diaphragmatic hernia without obstruction or gangrene: Secondary | ICD-10-CM | POA: Diagnosis not present

## 2020-10-07 DIAGNOSIS — R911 Solitary pulmonary nodule: Secondary | ICD-10-CM | POA: Diagnosis not present

## 2020-10-07 DIAGNOSIS — N319 Neuromuscular dysfunction of bladder, unspecified: Secondary | ICD-10-CM | POA: Diagnosis not present

## 2020-10-07 DIAGNOSIS — F32A Depression, unspecified: Secondary | ICD-10-CM | POA: Diagnosis not present

## 2020-10-07 DIAGNOSIS — S32012D Unstable burst fracture of first lumbar vertebra, subsequent encounter for fracture with routine healing: Secondary | ICD-10-CM | POA: Diagnosis not present

## 2020-10-07 DIAGNOSIS — Z86718 Personal history of other venous thrombosis and embolism: Secondary | ICD-10-CM | POA: Diagnosis not present

## 2020-10-07 DIAGNOSIS — W108XXD Fall (on) (from) other stairs and steps, subsequent encounter: Secondary | ICD-10-CM | POA: Diagnosis not present

## 2020-10-07 DIAGNOSIS — K529 Noninfective gastroenteritis and colitis, unspecified: Secondary | ICD-10-CM | POA: Diagnosis not present

## 2020-10-07 DIAGNOSIS — K5730 Diverticulosis of large intestine without perforation or abscess without bleeding: Secondary | ICD-10-CM | POA: Diagnosis not present

## 2020-10-07 DIAGNOSIS — S065X0D Traumatic subdural hemorrhage without loss of consciousness, subsequent encounter: Secondary | ICD-10-CM | POA: Diagnosis not present

## 2020-10-10 ENCOUNTER — Ambulatory Visit: Payer: BC Managed Care – PPO

## 2020-10-11 DIAGNOSIS — N319 Neuromuscular dysfunction of bladder, unspecified: Secondary | ICD-10-CM | POA: Diagnosis not present

## 2020-10-11 DIAGNOSIS — S32012D Unstable burst fracture of first lumbar vertebra, subsequent encounter for fracture with routine healing: Secondary | ICD-10-CM | POA: Diagnosis not present

## 2020-10-11 DIAGNOSIS — E039 Hypothyroidism, unspecified: Secondary | ICD-10-CM | POA: Diagnosis not present

## 2020-10-11 DIAGNOSIS — M5030 Other cervical disc degeneration, unspecified cervical region: Secondary | ICD-10-CM | POA: Diagnosis not present

## 2020-10-11 DIAGNOSIS — K449 Diaphragmatic hernia without obstruction or gangrene: Secondary | ICD-10-CM | POA: Diagnosis not present

## 2020-10-11 DIAGNOSIS — S065X0D Traumatic subdural hemorrhage without loss of consciousness, subsequent encounter: Secondary | ICD-10-CM | POA: Diagnosis not present

## 2020-10-11 DIAGNOSIS — W108XXD Fall (on) (from) other stairs and steps, subsequent encounter: Secondary | ICD-10-CM | POA: Diagnosis not present

## 2020-10-11 DIAGNOSIS — F32A Depression, unspecified: Secondary | ICD-10-CM | POA: Diagnosis not present

## 2020-10-11 DIAGNOSIS — R911 Solitary pulmonary nodule: Secondary | ICD-10-CM | POA: Diagnosis not present

## 2020-10-11 DIAGNOSIS — K529 Noninfective gastroenteritis and colitis, unspecified: Secondary | ICD-10-CM | POA: Diagnosis not present

## 2020-10-11 DIAGNOSIS — K5730 Diverticulosis of large intestine without perforation or abscess without bleeding: Secondary | ICD-10-CM | POA: Diagnosis not present

## 2020-10-11 DIAGNOSIS — Z86718 Personal history of other venous thrombosis and embolism: Secondary | ICD-10-CM | POA: Diagnosis not present

## 2020-10-13 ENCOUNTER — Inpatient Hospital Stay: Payer: BC Managed Care – PPO | Admitting: Osteopathic Medicine

## 2020-10-13 DIAGNOSIS — R339 Retention of urine, unspecified: Secondary | ICD-10-CM | POA: Insufficient documentation

## 2020-10-13 DIAGNOSIS — F32A Depression, unspecified: Secondary | ICD-10-CM | POA: Diagnosis not present

## 2020-10-13 DIAGNOSIS — S32012D Unstable burst fracture of first lumbar vertebra, subsequent encounter for fracture with routine healing: Secondary | ICD-10-CM | POA: Diagnosis not present

## 2020-10-13 DIAGNOSIS — M5030 Other cervical disc degeneration, unspecified cervical region: Secondary | ICD-10-CM | POA: Diagnosis not present

## 2020-10-13 DIAGNOSIS — R911 Solitary pulmonary nodule: Secondary | ICD-10-CM | POA: Diagnosis not present

## 2020-10-13 DIAGNOSIS — Z86718 Personal history of other venous thrombosis and embolism: Secondary | ICD-10-CM | POA: Diagnosis not present

## 2020-10-13 DIAGNOSIS — K449 Diaphragmatic hernia without obstruction or gangrene: Secondary | ICD-10-CM | POA: Diagnosis not present

## 2020-10-13 DIAGNOSIS — W108XXD Fall (on) (from) other stairs and steps, subsequent encounter: Secondary | ICD-10-CM | POA: Diagnosis not present

## 2020-10-13 DIAGNOSIS — S065X0D Traumatic subdural hemorrhage without loss of consciousness, subsequent encounter: Secondary | ICD-10-CM | POA: Diagnosis not present

## 2020-10-13 DIAGNOSIS — N319 Neuromuscular dysfunction of bladder, unspecified: Secondary | ICD-10-CM | POA: Diagnosis not present

## 2020-10-13 DIAGNOSIS — K529 Noninfective gastroenteritis and colitis, unspecified: Secondary | ICD-10-CM | POA: Diagnosis not present

## 2020-10-13 DIAGNOSIS — E039 Hypothyroidism, unspecified: Secondary | ICD-10-CM | POA: Diagnosis not present

## 2020-10-13 DIAGNOSIS — K5730 Diverticulosis of large intestine without perforation or abscess without bleeding: Secondary | ICD-10-CM | POA: Diagnosis not present

## 2020-10-14 DIAGNOSIS — S32011A Stable burst fracture of first lumbar vertebra, initial encounter for closed fracture: Secondary | ICD-10-CM | POA: Diagnosis not present

## 2020-10-14 DIAGNOSIS — W109XXD Fall (on) (from) unspecified stairs and steps, subsequent encounter: Secondary | ICD-10-CM | POA: Diagnosis not present

## 2020-10-14 DIAGNOSIS — Z981 Arthrodesis status: Secondary | ICD-10-CM | POA: Diagnosis not present

## 2020-10-14 DIAGNOSIS — S32011D Stable burst fracture of first lumbar vertebra, subsequent encounter for fracture with routine healing: Secondary | ICD-10-CM | POA: Diagnosis not present

## 2020-10-16 DIAGNOSIS — Z883 Allergy status to other anti-infective agents status: Secondary | ICD-10-CM | POA: Diagnosis not present

## 2020-10-16 DIAGNOSIS — Z466 Encounter for fitting and adjustment of urinary device: Secondary | ICD-10-CM | POA: Diagnosis not present

## 2020-10-16 DIAGNOSIS — Z888 Allergy status to other drugs, medicaments and biological substances status: Secondary | ICD-10-CM | POA: Diagnosis not present

## 2020-10-16 DIAGNOSIS — R39198 Other difficulties with micturition: Secondary | ICD-10-CM | POA: Diagnosis not present

## 2020-10-16 DIAGNOSIS — Z79890 Hormone replacement therapy (postmenopausal): Secondary | ICD-10-CM | POA: Diagnosis not present

## 2020-10-16 DIAGNOSIS — R1030 Lower abdominal pain, unspecified: Secondary | ICD-10-CM | POA: Diagnosis not present

## 2020-10-16 DIAGNOSIS — E039 Hypothyroidism, unspecified: Secondary | ICD-10-CM | POA: Diagnosis not present

## 2020-10-16 DIAGNOSIS — R140 Abdominal distension (gaseous): Secondary | ICD-10-CM | POA: Diagnosis not present

## 2020-10-16 DIAGNOSIS — T83091A Other mechanical complication of indwelling urethral catheter, initial encounter: Secondary | ICD-10-CM | POA: Diagnosis not present

## 2020-10-16 DIAGNOSIS — Z79899 Other long term (current) drug therapy: Secondary | ICD-10-CM | POA: Diagnosis not present

## 2020-10-17 ENCOUNTER — Other Ambulatory Visit: Payer: Self-pay

## 2020-10-17 ENCOUNTER — Ambulatory Visit (INDEPENDENT_AMBULATORY_CARE_PROVIDER_SITE_OTHER): Payer: BC Managed Care – PPO | Admitting: Osteopathic Medicine

## 2020-10-17 ENCOUNTER — Encounter: Payer: Self-pay | Admitting: Osteopathic Medicine

## 2020-10-17 VITALS — BP 131/79 | HR 103 | Temp 98.1°F | Wt 174.0 lb

## 2020-10-17 DIAGNOSIS — R7303 Prediabetes: Secondary | ICD-10-CM | POA: Diagnosis not present

## 2020-10-17 DIAGNOSIS — Z13820 Encounter for screening for osteoporosis: Secondary | ICD-10-CM

## 2020-10-17 DIAGNOSIS — M4846XA Fatigue fracture of vertebra, lumbar region, initial encounter for fracture: Secondary | ICD-10-CM

## 2020-10-17 DIAGNOSIS — Z9189 Other specified personal risk factors, not elsewhere classified: Secondary | ICD-10-CM

## 2020-10-17 DIAGNOSIS — R400 Somnolence: Secondary | ICD-10-CM

## 2020-10-17 DIAGNOSIS — Z0000 Encounter for general adult medical examination without abnormal findings: Secondary | ICD-10-CM | POA: Diagnosis not present

## 2020-10-17 MED ORDER — OXYCODONE-ACETAMINOPHEN 5-325 MG PO TABS: 0.5000 | ORAL_TABLET | Freq: Three times a day (TID) | ORAL | 0 refills | Status: DC

## 2020-10-17 NOTE — Progress Notes (Signed)
Nancy Beard is a 65 y.o. female who presents to  [place] at [facility]  today, 10/17/20, seeking care for the following:  . Annual physical  . Recent spinal surgery, recovering okay but would like to have pain Rx on hand as needed       ASSESSMENT & PLAN with other pertinent findings:  The primary encounter diagnosis was Annual physical exam. Diagnoses of Prediabetes, Screening for osteoporosis, Stress fracture of lumbar vertebra, initial encounter, At risk for central sleep apnea, Daytime somnolence, and At risk for obstructive sleep apnea were also pertinent to this visit.    Patient Instructions  Bone density test - evaluate for osteoporosis Home sleep study - evaluate for sleep apnea Pain medications - controlled substance contract signed today, drug screen today, #45 pills for 30 days, after that can hopefully taper down to #45 for 90 days. To maintain prescription for opiates, office or virtual visits are required every 3 months per [place] law and per [facility] policy.      General Preventive Care  Most recent routine screening labs: not needed right now.   Blood pressure goal 130/80 or less.   Tobacco: don't!   Alcohol: responsible moderation is ok for most adults - if you have concerns about your alcohol intake, please talk to me!   Exercise: as tolerated to reduce risk of cardiovascular disease and diabetes. Strength training will also prevent osteoporosis.   Mental health: if need for mental health care (medicines, counseling, other), or concerns about moods, please let me know!   Sexual / Reproductive health: if need for STD testing, or if concerns with libido/pain problems, please let me know!  Advanced Directive: Living Will and/or Healthcare Power of Attorney recommended for all adults, regardless of age or health.  Vaccines  Flu vaccine: for almost everyone, every fall.   Shingles vaccine: all  done  Pneumonia vaccines: second shot 04/2021 then done.  Tetanus booster: every 10 years (2026)  COVID vaccine: THANKS for getting your vaccine! :)  Cancer screenings   Colon cancer screening: Colonoscopy 2031  Breast cancer surveillance: per oncology  Cervical cancer screening: Pap stop at age 66 or w/ hysterectomy.   Lung cancer screening: not needed for non-smokers  Infection screenings  . HIV: recommended screening at least once age 6-65 . Gonorrhea/Chlamydia: screening as needed. . Hepatitis C: recommended once for everyone age 61-75 . TB: certain at-risk populations Other . Bone Density Test: recommended    Orders Placed This Encounter  Procedures  . DG Bone Density  . DRUG MONITOR, PANEL 5, SCREEN, URINE  . DM TEMPLATE  . Home sleep test    Meds ordered this encounter  Medications  . oxyCODONE-acetaminophen (PERCOCET/ROXICET) 5-325 MG tablet    Sig: Take 0.5 tablets by mouth every 8 (eight) hours. Prn severe pain    Dispense:  45 tablet    Refill:  0     See below for relevant physical exam findings  See below for recent lab and imaging results reviewed  Medications, allergies, PMH, PSH, SocH, FamH reviewed below    Follow-up instructions: Return in about 6 months (around 04/28/2021) for about 6 months for routine check-up and 2nd pneumonia shot .                                        Exam:  BP 131/79 (  BP Location: Left Arm, Patient Position: Sitting, Cuff Size: Normal)   Pulse (!) 103   Temp 98.1 F (36.7 C) (Oral)   Wt 174 lb 0.6 oz (78.9 kg)   LMP 09/11/2012   BMI 29.87 kg/m   Constitutional: VS see above. General Appearance: alert, well-developed, well-nourished, NAD  Neck: No masses, trachea midline.   Respiratory: Normal respiratory effort. no wheeze, no rhonchi, no rales  Cardiovascular: S1/S2 normal, no murmur, no rub/gallop auscultated. RRR.   Musculoskeletal: Gait not examined, patient in  wheelchair and back brace  Neurological: Normal balance/coordination. No tremor.  Skin: warm, dry, intact.   Psychiatric: Normal judgment/insight. Normal mood and affect. Oriented x3.   Current Meds  Medication Sig  . acetaminophen (TYLENOL) 325 MG tablet Take by mouth.  . AMBULATORY NON FORMULARY MEDICATION BLOOD PRESSURE MONITOR - ARM CUFF PER PATIENT PREFERENCE / INSURANCE COVERAGE, DX: ELEVATED BLOOD PRESSURE  . ferrous sulfate 325 (65 FE) MG EC tablet Take one tablet (325 mg) three days a week spaced apart.  . gabapentin (NEURONTIN) 300 MG capsule Take by mouth.  . levothyroxine (SYNTHROID) 112 MCG tablet TAKE 1 TABLET (112 MCG TOTAL) BY MOUTH DAILY BEFORE BREAKFAST.  [name] ondansetron (ZOFRAN) 4 MG tablet Take 1 tablet (4 mg total) by mouth every 8 (eight) hours as needed for nausea or vomiting.  . prochlorperazine (COMPAZINE) 5 MG tablet Take 1 tablet (5 mg total) by mouth every 6 (six) hours as needed for nausea or vomiting.  . traMADol (ULTRAM) 50 MG tablet Take 1 tablet (50 mg total) by mouth every 8 (eight) hours as needed for severe pain. For use AFTER surgery  . valACYclovir (VALTREX) 1000 MG tablet Take 2 tablets by mouth 2 (two) times daily as needed (OUTBREAK).   [name] venlafaxine XR (EFFEXOR-XR) 150 MG 24 hr capsule Take 1 capsule (150 mg total) by mouth daily with breakfast.    Allergies  Allergen Reactions  . Ciprofloxacin Hives, Itching and Other (See Comments)    Patient states she had muscle spasms   . Hydromorphone Hcl Itching  . Nitrofurantoin Hives, Rash and Other (See Comments)    Significant transaminitis   . Gluten Meal Other (See Comments)    Patient Active Problem List   Diagnosis Date Noted  . Breast asymmetry following reconstructive surgery 05/03/2020  . Screening for HIV (human immunodeficiency virus) 04/28/2020  . Need for vaccination with 13-polyvalent pneumococcal conjugate vaccine 04/28/2020  . Need for influenza vaccination 04/28/2020  .  Screening for colon cancer 04/28/2020  . Screening for osteoporosis 04/28/2020  . Elevated liver enzymes 11/12/2018  . Prediabetes 11/12/2018  . History of high cholesterol 11/12/2018  . Herpes simplex 04/12/2017  . Abnormal transaminases 06/27/2015  . Iron deficiency 06/24/2015  . Vitamin D deficiency 06/20/2015  . Genital herpes 09/06/2014  . Neuropathy 09/06/2014  . Anticoagulant long-term use 05/01/2014  . Persistent headaches 02/22/2014  . Hot flashes related to aromatase inhibitor therapy 12/10/2013  . Vulvar intraepithelial neoplasia III (VIN III) 06/30/2013  . Status post bilateral breast reconstruction 06/30/2013  . Osteoporosis 05/13/2013  . Acquired absence of bilateral breasts and nipples 09/23/2012  . Primary cancer of lower-inner quadrant of right female breast ([place]) 06/24/2012  . Hyperlipidemia 05/13/2008  . Hypothyroidism 01/31/2007  . Depression 01/31/2007  . Celiac disease/sprue 01/31/2007    Family History  Problem Relation Age of Onset  . Breast cancer Paternal Aunt        dx in her 24s  . Lymphoma Paternal Uncle   .  Breast cancer Paternal Grandmother        dx >50  . Heart attack Paternal Grandfather   . Breast cancer Other        2 maternal great aunts with breast cancer >50  . Osteoporosis Mother        controlled with diet/exercise  . COPD Father   . Colon cancer Neg Hx   . Colon polyps Neg Hx   . Esophageal cancer Neg Hx   . Rectal cancer Neg Hx   . Stomach cancer Neg Hx     Social History   Tobacco Use  Smoking Status Never Smoker  Smokeless Tobacco Never Used    Past Surgical History:  Procedure Laterality Date  . AXILLARY SENTINEL NODE BIOPSY Right 09/11/2012   Procedure: AXILLARY SENTINEL lymph NODE  BIOPSY;  Surgeon: Odis Hollingshead, MD;  Location: [place];  Service: General;  Laterality: Right;  right nuclear medicine injection 12:30   . BREAST LUMPECTOMY     benign lump  . BREAST RECONSTRUCTION Right 09/24/2013   Procedure:  REVISION OF RIGHT BREAST RECONSTRUCTION WITH REPOSITIONING RIGHT IMPLANT, POSSIBLE EXCISION CAPSULAR CONTRACTURE AND LIPOFILLING FOR FAT GRAFTING;  Surgeon: Theodoro Kos, DO;  Location: [place];  Service: Plastics;  Laterality: Right;  . BREAST RECONSTRUCTION WITH PLACEMENT OF TISSUE EXPANDER AND FLEX HD (ACELLULAR HYDRATED DERMIS) Bilateral 09/11/2012   Procedure: BREAST RECONSTRUCTION WITH PLACEMENT OF TISSUE EXPANDER AND FLEX HD (ACELLULAR HYDRATED DERMIS) ADM;  Surgeon: Theodoro Kos, DO;  Location: [place];  Service: Plastics;  Laterality: Bilateral;  . COLONOSCOPY  12/20/2004   Carlean Purl  . cryoblation of cervix     long time ago  . ESOPHAGOGASTRODUODENOSCOPY  2007   sprue  . EXTERNAL FIXATION LEG Left 06/15/2015   Procedure: EXTERNAL FIXATION LEG;  Surgeon: Altamese [place], MD;  Location: [place];  Service: Orthopedics;  Laterality: Left;  . INCISION AND DRAINAGE OF WOUND Left 09/16/2012   Procedure: Left Breast Evacuation of Hematoma;  Surgeon: Theodoro Kos, DO;  Location: [place];  Service: Plastics;  Laterality: Left;  . LIPOSUCTION WITH LIPOFILLING Bilateral 09/24/2013   Procedure: LIPOSUCTION WITH LIPOFILLING;  Surgeon: Theodoro Kos, DO;  Location: [place];  Service: Plastics;  Laterality: Bilateral;  Biltalteal filling breast  . LIPOSUCTION WITH LIPOFILLING Bilateral 06/08/2020   Procedure: LIPOSUCTION WITH LIPOFILLING;  Surgeon: Wallace Going, DO;  Location: [place];  Service: Plastics;  Laterality: Bilateral;  90 min, please  . ORIF TIBIA PLATEAU Left 06/21/2015   Procedure: OPEN REDUCTION INTERNAL FIXATION (ORIF) TIBIAL PLATEAU REMOVAL OF EXTERNAL FISTULA;  Surgeon: Altamese Byron, MD;  Location: [place];  Service: Orthopedics;  Laterality: Left;  . PORT-A-CATH REMOVAL Right 08/07/2013   Procedure: REMOVAL PORT-A-CATH;  Surgeon: Odis Hollingshead, MD;  Location: [place];  Service: General;  Laterality: Right;  .  PORTACATH PLACEMENT Right 10/09/2012   Procedure: US GUIDED INSERTION PORT-A-CATH;  Surgeon: Odis Hollingshead, MD;  Location: [place];  Service: General;  Laterality: Right;  Right Subclavian Vein  . REMOVAL OF BILATERAL TISSUE EXPANDERS WITH PLACEMENT OF BILATERAL BREAST IMPLANTS Bilateral 06/24/2013   Procedure: REMOVAL OF BILATERAL TISSUE EXPANDERS WITH PLACEMENT OF BILATERAL BREAST IMPLANTS;  Surgeon: Theodoro Kos, DO;  Location: [place];  Service: Plastics;  Laterality: Bilateral;  . TOTAL MASTECTOMY Bilateral 09/11/2012   Procedure: bilateral MASTECTOMY;  Surgeon: Odis Hollingshead, MD;  Location: [place];  Service: General;  Laterality: Bilateral;  .  VULVECTOMY N/A 07/14/2013   Procedure: WIDE LOCAL  EXCISION VULVAR;  Surgeon: Alvino Chapel, MD;  Location: [facility];  Service: Gynecology;  Laterality: N/A;    Immunization History  Administered Date(s) Administered  . Fluad Quad(high Dose 65+) 04/28/2020  . Influenza,inj,Quad PF,6+ Mos 04/12/2017, 05/14/2018  . Influenza-Unspecified 05/30/2014, 06/01/2015, 04/15/2016  . Janssen (J&J) SARS-COV-2 Vaccination 10/24/2019  . Moderna Sars-Covid-2 Vaccination 05/13/2020  . Pneumococcal Conjugate-13 04/28/2020  . Tdap 09/06/2014  . Zoster Recombinat (Shingrix) 05/10/2017, 11/08/2017    Recent Results (from the past 2160 hour(s))  DRUG MONITOR, PANEL 5, SCREEN, URINE     Status: Abnormal   Collection Time: 10/17/20 12:06 PM  Result Value Ref Range   Amphetamines NEGATIVE <500 ng/mL    Comment: See Note A   Barbiturates NEGATIVE <300 ng/mL    Comment: See Note A   Benzodiazepines NEGATIVE <100 ng/mL    Comment: See Note A   Cocaine Metabolite NEGATIVE <150 ng/mL    Comment: See Note A   Marijuana Metabolite POSITIVE (A) <20 ng/mL    Comment: See Note A   Methadone Metabolite NEGATIVE <100 ng/mL    Comment: See Note A   Opiates NEGATIVE <100 ng/mL    Comment: See Note A   Oxycodone POSITIVE  (A) <100 ng/mL    Comment: See Note A   Creatinine 49.8 mg/dL   pH 6.5 4.5 - 9.0   Oxidant NEGATIVE mcg/mL  DM TEMPLATE     Status: None   Collection Time: 10/17/20 12:06 PM  Result Value Ref Range   Notes and Comments      Comment: This drug testing is for medical treatment only. Analysis was performed as non-forensic testing and these results should be used only by healthcare providers to render diagnosis or treatment, or to monitor progress of medical conditions. . Note A: The results are presumptive; based only on screening methods, and they have not been confirmed by a definitive method. . . Healthcare Providers needing Interpretation assistance,  please contact us at [phone] ([phone])  M-F, 8am to 10pm EST     No results found.     All questions at time of visit were answered - patient instructed to contact office with any additional concerns or updates. ER/RTC precautions were reviewed with the patient as applicable.   Please note: manual typing as well as voice recognition software may have been used to produce this document - typos may escape review. Please contact Dr. Sheppard Coil for any needed clarifications.

## 2020-10-17 NOTE — Patient Instructions (Addendum)
Bone density test - evaluate for osteoporosis Home sleep study - evaluate for sleep apnea Pain medications - controlled substance contract signed today, drug screen today, #45 pills for 30 days, after that can hopefully taper down to #35 for 90 days. To maintain prescription for opiates, office or virtual visits are required every 3 months per [place] law and per [facility] policy.      General Preventive Care  Most recent routine screening labs: not needed right now.   Blood pressure goal 130/80 or less.   Tobacco: don't!   Alcohol: responsible moderation is ok for most adults - if you have concerns about your alcohol intake, please talk to me!   Exercise: as tolerated to reduce risk of cardiovascular disease and diabetes. Strength training will also prevent osteoporosis.   Mental health: if need for mental health care (medicines, counseling, other), or concerns about moods, please let me know!   Sexual / Reproductive health: if need for STD testing, or if concerns with libido/pain problems, please let me know!  Advanced Directive: Living Will and/or Healthcare Power of Attorney recommended for all adults, regardless of age or health.  Vaccines  Flu vaccine: for almost everyone, every fall.   Shingles vaccine: all done  Pneumonia vaccines: second shot 04/2021 then done.  Tetanus booster: every 10 years (2026)  COVID vaccine: THANKS for getting your vaccine! :)  Cancer screenings   Colon cancer screening: Colonoscopy 2031  Breast cancer surveillance: per oncology  Cervical cancer screening: Pap stop at age 78 or w/ hysterectomy.   Lung cancer screening: not needed for non-smokers  Infection screenings  . HIV: recommended screening at least once age 22-65 . Gonorrhea/Chlamydia: screening as needed. . Hepatitis C: recommended once for everyone age 39-75 . TB: certain at-risk populations Other . Bone Density Test: recommended

## 2020-10-18 DIAGNOSIS — R339 Retention of urine, unspecified: Secondary | ICD-10-CM | POA: Diagnosis not present

## 2020-10-18 LAB — DRUG MONITOR, PANEL 5, SCREEN, URINE
Amphetamines: NEGATIVE ng/mL (ref <500)
Barbiturates: NEGATIVE ng/mL (ref <300)
Benzodiazepines: NEGATIVE ng/mL (ref <100)
Cocaine Metabolite: NEGATIVE ng/mL (ref <150)
Creatinine: 49.8 mg/dL
Marijuana Metabolite: POSITIVE ng/mL — AB (ref <20)
Methadone Metabolite: NEGATIVE ng/mL (ref <100)
Opiates: NEGATIVE ng/mL (ref <100)
Oxidant: NEGATIVE ug/mL
Oxycodone: POSITIVE ng/mL — AB (ref <100)
pH: 6.5 (ref 4.5–9.0)

## 2020-10-18 LAB — DM TEMPLATE

## 2020-10-19 DIAGNOSIS — S065X0D Traumatic subdural hemorrhage without loss of consciousness, subsequent encounter: Secondary | ICD-10-CM | POA: Diagnosis not present

## 2020-10-19 DIAGNOSIS — E039 Hypothyroidism, unspecified: Secondary | ICD-10-CM | POA: Diagnosis not present

## 2020-10-19 DIAGNOSIS — R911 Solitary pulmonary nodule: Secondary | ICD-10-CM | POA: Diagnosis not present

## 2020-10-19 DIAGNOSIS — K5730 Diverticulosis of large intestine without perforation or abscess without bleeding: Secondary | ICD-10-CM | POA: Diagnosis not present

## 2020-10-19 DIAGNOSIS — S32012D Unstable burst fracture of first lumbar vertebra, subsequent encounter for fracture with routine healing: Secondary | ICD-10-CM | POA: Diagnosis not present

## 2020-10-19 DIAGNOSIS — M5030 Other cervical disc degeneration, unspecified cervical region: Secondary | ICD-10-CM | POA: Diagnosis not present

## 2020-10-19 DIAGNOSIS — Z86718 Personal history of other venous thrombosis and embolism: Secondary | ICD-10-CM | POA: Diagnosis not present

## 2020-10-19 DIAGNOSIS — N319 Neuromuscular dysfunction of bladder, unspecified: Secondary | ICD-10-CM | POA: Diagnosis not present

## 2020-10-19 DIAGNOSIS — K529 Noninfective gastroenteritis and colitis, unspecified: Secondary | ICD-10-CM | POA: Diagnosis not present

## 2020-10-19 DIAGNOSIS — K449 Diaphragmatic hernia without obstruction or gangrene: Secondary | ICD-10-CM | POA: Diagnosis not present

## 2020-10-19 DIAGNOSIS — F32A Depression, unspecified: Secondary | ICD-10-CM | POA: Diagnosis not present

## 2020-10-19 DIAGNOSIS — W108XXD Fall (on) (from) other stairs and steps, subsequent encounter: Secondary | ICD-10-CM | POA: Diagnosis not present

## 2020-10-20 ENCOUNTER — Other Ambulatory Visit: Payer: Self-pay | Admitting: Osteopathic Medicine

## 2020-10-20 ENCOUNTER — Telehealth: Payer: Self-pay

## 2020-10-20 ENCOUNTER — Ambulatory Visit (INDEPENDENT_AMBULATORY_CARE_PROVIDER_SITE_OTHER): Payer: BC Managed Care – PPO

## 2020-10-20 ENCOUNTER — Other Ambulatory Visit: Payer: Self-pay

## 2020-10-20 DIAGNOSIS — M79604 Pain in right leg: Secondary | ICD-10-CM

## 2020-10-20 DIAGNOSIS — K529 Noninfective gastroenteritis and colitis, unspecified: Secondary | ICD-10-CM | POA: Diagnosis not present

## 2020-10-20 DIAGNOSIS — W108XXD Fall (on) (from) other stairs and steps, subsequent encounter: Secondary | ICD-10-CM | POA: Diagnosis not present

## 2020-10-20 DIAGNOSIS — K449 Diaphragmatic hernia without obstruction or gangrene: Secondary | ICD-10-CM | POA: Diagnosis not present

## 2020-10-20 DIAGNOSIS — N319 Neuromuscular dysfunction of bladder, unspecified: Secondary | ICD-10-CM | POA: Diagnosis not present

## 2020-10-20 DIAGNOSIS — I82431 Acute embolism and thrombosis of right popliteal vein: Secondary | ICD-10-CM | POA: Diagnosis not present

## 2020-10-20 DIAGNOSIS — R911 Solitary pulmonary nodule: Secondary | ICD-10-CM | POA: Diagnosis not present

## 2020-10-20 DIAGNOSIS — Z86718 Personal history of other venous thrombosis and embolism: Secondary | ICD-10-CM | POA: Diagnosis not present

## 2020-10-20 DIAGNOSIS — F32A Depression, unspecified: Secondary | ICD-10-CM | POA: Diagnosis not present

## 2020-10-20 DIAGNOSIS — I824Y1 Acute embolism and thrombosis of unspecified deep veins of right proximal lower extremity: Secondary | ICD-10-CM

## 2020-10-20 DIAGNOSIS — M5030 Other cervical disc degeneration, unspecified cervical region: Secondary | ICD-10-CM | POA: Diagnosis not present

## 2020-10-20 DIAGNOSIS — E039 Hypothyroidism, unspecified: Secondary | ICD-10-CM | POA: Diagnosis not present

## 2020-10-20 DIAGNOSIS — S32012D Unstable burst fracture of first lumbar vertebra, subsequent encounter for fracture with routine healing: Secondary | ICD-10-CM | POA: Diagnosis not present

## 2020-10-20 DIAGNOSIS — M7989 Other specified soft tissue disorders: Secondary | ICD-10-CM

## 2020-10-20 DIAGNOSIS — S065X0D Traumatic subdural hemorrhage without loss of consciousness, subsequent encounter: Secondary | ICD-10-CM | POA: Diagnosis not present

## 2020-10-20 DIAGNOSIS — K5730 Diverticulosis of large intestine without perforation or abscess without bleeding: Secondary | ICD-10-CM | POA: Diagnosis not present

## 2020-10-20 MED ORDER — RIVAROXABAN 20 MG PO TABS: 20.0000 mg | ORAL_TABLET | Freq: Every day | ORAL | 0 refills | Status: DC

## 2020-10-20 MED ORDER — RIVAROXABAN 15 MG PO TABS: 15.0000 mg | ORAL_TABLET | Freq: Two times a day (BID) | ORAL | 0 refills | Status: DC

## 2020-10-20 MED ORDER — APIXABAN 5 MG PO TABS
ORAL_TABLET | ORAL | 0 refills | Status: DC
Start: 2020-10-20 — End: 2020-10-20

## 2020-10-20 NOTE — Telephone Encounter (Signed)
Pt called and stated that she had an appointment on 4/4 and mentioned some swelling on her right leg. Pt states her leg as gotten worse and she thinks it's more painful and swollen than it was. Pt is worried about blood clots and is requesting an [place]. Please advise, thanks.

## 2020-10-20 NOTE — Telephone Encounter (Signed)
Pt notified of [place], imaging called and scheduled her to get [place] this afternoon.

## 2020-10-20 NOTE — Telephone Encounter (Signed)
Thanks, Xarelto sent

## 2020-10-20 NOTE — Telephone Encounter (Signed)
Per [facility] - apixaban will require prior authorization. Preferred formulary is Xarelto. Pls send in an updated rx to the pharmacy.

## 2020-10-20 NOTE — Telephone Encounter (Signed)
STAT US order placed for imaging downstairs, pt can call them to arrange [place]

## 2020-10-21 ENCOUNTER — Other Ambulatory Visit: Payer: Self-pay

## 2020-10-21 ENCOUNTER — Telehealth: Payer: Self-pay

## 2020-10-21 DIAGNOSIS — R52 Pain, unspecified: Secondary | ICD-10-CM

## 2020-10-21 DIAGNOSIS — R339 Retention of urine, unspecified: Secondary | ICD-10-CM | POA: Diagnosis not present

## 2020-10-21 DIAGNOSIS — I824Y1 Acute embolism and thrombosis of unspecified deep veins of right proximal lower extremity: Secondary | ICD-10-CM

## 2020-10-21 DIAGNOSIS — M79604 Pain in right leg: Secondary | ICD-10-CM

## 2020-10-21 MED ORDER — RIVAROXABAN 15 MG PO TABS: 15.0000 mg | ORAL_TABLET | Freq: Two times a day (BID) | ORAL | 0 refills | Status: DC

## 2020-10-21 MED ORDER — OXYCODONE-ACETAMINOPHEN 5-325 MG PO TABS: 0.5000 | ORAL_TABLET | Freq: Three times a day (TID) | ORAL | 0 refills | Status: AC

## 2020-10-21 NOTE — Telephone Encounter (Signed)
Pt asked for refill on her Percocet. Called [facility] and they stated they never received Rx sent on 4/4. Rx pended.

## 2020-10-21 NOTE — Telephone Encounter (Signed)
Called [facility] to confirm they received the Rx for Xarelto 15mg . [facility] said that they again, did not receive the Rx. Verbal order given. Pt notified.

## 2020-10-22 ENCOUNTER — Other Ambulatory Visit: Payer: Self-pay | Admitting: Osteopathic Medicine

## 2020-10-22 DIAGNOSIS — I824Y1 Acute embolism and thrombosis of unspecified deep veins of right proximal lower extremity: Secondary | ICD-10-CM

## 2020-10-25 DIAGNOSIS — W108XXD Fall (on) (from) other stairs and steps, subsequent encounter: Secondary | ICD-10-CM | POA: Diagnosis not present

## 2020-10-25 DIAGNOSIS — E039 Hypothyroidism, unspecified: Secondary | ICD-10-CM | POA: Diagnosis not present

## 2020-10-25 DIAGNOSIS — S32012D Unstable burst fracture of first lumbar vertebra, subsequent encounter for fracture with routine healing: Secondary | ICD-10-CM | POA: Diagnosis not present

## 2020-10-25 DIAGNOSIS — M5030 Other cervical disc degeneration, unspecified cervical region: Secondary | ICD-10-CM | POA: Diagnosis not present

## 2020-10-25 DIAGNOSIS — F32A Depression, unspecified: Secondary | ICD-10-CM | POA: Diagnosis not present

## 2020-10-25 DIAGNOSIS — K5730 Diverticulosis of large intestine without perforation or abscess without bleeding: Secondary | ICD-10-CM | POA: Diagnosis not present

## 2020-10-25 DIAGNOSIS — K529 Noninfective gastroenteritis and colitis, unspecified: Secondary | ICD-10-CM | POA: Diagnosis not present

## 2020-10-25 DIAGNOSIS — K449 Diaphragmatic hernia without obstruction or gangrene: Secondary | ICD-10-CM | POA: Diagnosis not present

## 2020-10-25 DIAGNOSIS — Z86718 Personal history of other venous thrombosis and embolism: Secondary | ICD-10-CM | POA: Diagnosis not present

## 2020-10-25 DIAGNOSIS — R911 Solitary pulmonary nodule: Secondary | ICD-10-CM | POA: Diagnosis not present

## 2020-10-25 DIAGNOSIS — N319 Neuromuscular dysfunction of bladder, unspecified: Secondary | ICD-10-CM | POA: Diagnosis not present

## 2020-10-25 DIAGNOSIS — S065X0D Traumatic subdural hemorrhage without loss of consciousness, subsequent encounter: Secondary | ICD-10-CM | POA: Diagnosis not present

## 2020-10-26 DIAGNOSIS — K592 Neurogenic bowel, not elsewhere classified: Secondary | ICD-10-CM | POA: Diagnosis not present

## 2020-10-26 DIAGNOSIS — Z789 Other specified health status: Secondary | ICD-10-CM | POA: Diagnosis not present

## 2020-10-26 DIAGNOSIS — Z7409 Other reduced mobility: Secondary | ICD-10-CM | POA: Diagnosis not present

## 2020-10-26 DIAGNOSIS — M62838 Other muscle spasm: Secondary | ICD-10-CM | POA: Diagnosis not present

## 2020-10-27 DIAGNOSIS — K529 Noninfective gastroenteritis and colitis, unspecified: Secondary | ICD-10-CM | POA: Diagnosis not present

## 2020-10-27 DIAGNOSIS — K5730 Diverticulosis of large intestine without perforation or abscess without bleeding: Secondary | ICD-10-CM | POA: Diagnosis not present

## 2020-10-27 DIAGNOSIS — R911 Solitary pulmonary nodule: Secondary | ICD-10-CM | POA: Diagnosis not present

## 2020-10-27 DIAGNOSIS — E039 Hypothyroidism, unspecified: Secondary | ICD-10-CM | POA: Diagnosis not present

## 2020-10-27 DIAGNOSIS — S065X0D Traumatic subdural hemorrhage without loss of consciousness, subsequent encounter: Secondary | ICD-10-CM | POA: Diagnosis not present

## 2020-10-27 DIAGNOSIS — K449 Diaphragmatic hernia without obstruction or gangrene: Secondary | ICD-10-CM | POA: Diagnosis not present

## 2020-10-27 DIAGNOSIS — N319 Neuromuscular dysfunction of bladder, unspecified: Secondary | ICD-10-CM | POA: Diagnosis not present

## 2020-10-27 DIAGNOSIS — M5030 Other cervical disc degeneration, unspecified cervical region: Secondary | ICD-10-CM | POA: Diagnosis not present

## 2020-10-27 DIAGNOSIS — W108XXD Fall (on) (from) other stairs and steps, subsequent encounter: Secondary | ICD-10-CM | POA: Diagnosis not present

## 2020-10-27 DIAGNOSIS — Z86718 Personal history of other venous thrombosis and embolism: Secondary | ICD-10-CM | POA: Diagnosis not present

## 2020-10-27 DIAGNOSIS — F32A Depression, unspecified: Secondary | ICD-10-CM | POA: Diagnosis not present

## 2020-10-27 DIAGNOSIS — S32012D Unstable burst fracture of first lumbar vertebra, subsequent encounter for fracture with routine healing: Secondary | ICD-10-CM | POA: Diagnosis not present

## 2020-10-31 ENCOUNTER — Telehealth: Payer: Self-pay

## 2020-10-31 NOTE — Telephone Encounter (Signed)
Needs to contact her urologist for this.

## 2020-10-31 NOTE — Telephone Encounter (Signed)
Pt called requesting a refill for tamsulosin. New rx. Per pt, the rx was given to her by the ER provider.

## 2020-11-01 ENCOUNTER — Ambulatory Visit: Payer: BC Managed Care – PPO | Admitting: Surgical

## 2020-11-02 ENCOUNTER — Other Ambulatory Visit: Payer: Self-pay

## 2020-11-02 ENCOUNTER — Ambulatory Visit (INDEPENDENT_AMBULATORY_CARE_PROVIDER_SITE_OTHER): Payer: BC Managed Care – PPO

## 2020-11-02 DIAGNOSIS — Z13820 Encounter for screening for osteoporosis: Secondary | ICD-10-CM

## 2020-11-02 DIAGNOSIS — M85851 Other specified disorders of bone density and structure, right thigh: Secondary | ICD-10-CM | POA: Diagnosis not present

## 2020-11-02 DIAGNOSIS — M4846XA Fatigue fracture of vertebra, lumbar region, initial encounter for fracture: Secondary | ICD-10-CM

## 2020-11-02 NOTE — Telephone Encounter (Signed)
Task completed. Pt was updated of provider's recommendation. No other inquiries during the call.

## 2020-11-03 DIAGNOSIS — R911 Solitary pulmonary nodule: Secondary | ICD-10-CM | POA: Diagnosis not present

## 2020-11-03 DIAGNOSIS — W108XXD Fall (on) (from) other stairs and steps, subsequent encounter: Secondary | ICD-10-CM | POA: Diagnosis not present

## 2020-11-03 DIAGNOSIS — S32012D Unstable burst fracture of first lumbar vertebra, subsequent encounter for fracture with routine healing: Secondary | ICD-10-CM | POA: Diagnosis not present

## 2020-11-03 DIAGNOSIS — S065X0D Traumatic subdural hemorrhage without loss of consciousness, subsequent encounter: Secondary | ICD-10-CM | POA: Diagnosis not present

## 2020-11-03 DIAGNOSIS — K5730 Diverticulosis of large intestine without perforation or abscess without bleeding: Secondary | ICD-10-CM | POA: Diagnosis not present

## 2020-11-03 DIAGNOSIS — N319 Neuromuscular dysfunction of bladder, unspecified: Secondary | ICD-10-CM | POA: Diagnosis not present

## 2020-11-03 DIAGNOSIS — F32A Depression, unspecified: Secondary | ICD-10-CM | POA: Diagnosis not present

## 2020-11-03 DIAGNOSIS — K529 Noninfective gastroenteritis and colitis, unspecified: Secondary | ICD-10-CM | POA: Diagnosis not present

## 2020-11-03 DIAGNOSIS — M5030 Other cervical disc degeneration, unspecified cervical region: Secondary | ICD-10-CM | POA: Diagnosis not present

## 2020-11-03 DIAGNOSIS — K449 Diaphragmatic hernia without obstruction or gangrene: Secondary | ICD-10-CM | POA: Diagnosis not present

## 2020-11-03 DIAGNOSIS — Z86718 Personal history of other venous thrombosis and embolism: Secondary | ICD-10-CM | POA: Diagnosis not present

## 2020-11-03 DIAGNOSIS — E039 Hypothyroidism, unspecified: Secondary | ICD-10-CM | POA: Diagnosis not present

## 2020-11-07 ENCOUNTER — Telehealth: Payer: Self-pay

## 2020-11-07 NOTE — Telephone Encounter (Addendum)
Routing to covering provider -   Pt called with some concern of swelling on her right leg. Pt was recently diagnosed with DVT, currently taking Xarelto. Pt wants to know if this is normal or should she be concerned?   She also mentioned that the nerve pain in her left leg is getting worse. She is taking 0.5 tab am/pm of hydrocodone. She is requesting if rx can be increased? Pls advise, thanks.

## 2020-11-08 NOTE — Telephone Encounter (Signed)
Task completed. Pt states that the swelling is no longer an issue. She has been staying off her feet and keeping her legs elevated whenever possible. What is provider's recommendation regarding the increase for hydrocodone?

## 2020-11-08 NOTE — Telephone Encounter (Signed)
She needs appt if leg still swelling. If taking xarelto then not concerning for a clot but still should be seen.

## 2020-11-08 NOTE — Telephone Encounter (Signed)
Dr. Loni Muse goal was to taper down to 45 tablets in a 90 day period instead of 30 day period so I don't think her PCP wants her to be taking this daily. Since she continues to be in pain she needs to consider other ways to treat the pain. Has she seen sports medicine, Dr. Darene Lamer? That would be a good next step. Norco was last rx 4/8 and she is not due for a refill until 5/8. She will need to wait until PCP is back to determine the increase.

## 2020-11-10 DIAGNOSIS — Z86718 Personal history of other venous thrombosis and embolism: Secondary | ICD-10-CM | POA: Diagnosis not present

## 2020-11-10 DIAGNOSIS — N319 Neuromuscular dysfunction of bladder, unspecified: Secondary | ICD-10-CM | POA: Diagnosis not present

## 2020-11-10 DIAGNOSIS — K5730 Diverticulosis of large intestine without perforation or abscess without bleeding: Secondary | ICD-10-CM | POA: Diagnosis not present

## 2020-11-10 DIAGNOSIS — W108XXD Fall (on) (from) other stairs and steps, subsequent encounter: Secondary | ICD-10-CM | POA: Diagnosis not present

## 2020-11-10 DIAGNOSIS — S065X0D Traumatic subdural hemorrhage without loss of consciousness, subsequent encounter: Secondary | ICD-10-CM | POA: Diagnosis not present

## 2020-11-10 DIAGNOSIS — E039 Hypothyroidism, unspecified: Secondary | ICD-10-CM | POA: Diagnosis not present

## 2020-11-10 DIAGNOSIS — K529 Noninfective gastroenteritis and colitis, unspecified: Secondary | ICD-10-CM | POA: Diagnosis not present

## 2020-11-10 DIAGNOSIS — F32A Depression, unspecified: Secondary | ICD-10-CM | POA: Diagnosis not present

## 2020-11-10 DIAGNOSIS — R911 Solitary pulmonary nodule: Secondary | ICD-10-CM | POA: Diagnosis not present

## 2020-11-10 DIAGNOSIS — M5030 Other cervical disc degeneration, unspecified cervical region: Secondary | ICD-10-CM | POA: Diagnosis not present

## 2020-11-10 DIAGNOSIS — S32012D Unstable burst fracture of first lumbar vertebra, subsequent encounter for fracture with routine healing: Secondary | ICD-10-CM | POA: Diagnosis not present

## 2020-11-10 DIAGNOSIS — K449 Diaphragmatic hernia without obstruction or gangrene: Secondary | ICD-10-CM | POA: Diagnosis not present

## 2020-11-10 NOTE — Telephone Encounter (Signed)
Agree w/ Luvenia Starch needs appt especially if requesting Rx dose change

## 2020-11-15 DIAGNOSIS — R339 Retention of urine, unspecified: Secondary | ICD-10-CM | POA: Diagnosis not present

## 2020-11-16 ENCOUNTER — Emergency Department
Admission: EM | Admit: 2020-11-16 | Discharge: 2020-11-16 | Disposition: A | Discharge status: Home / Self Care | Payer: BC Managed Care – PPO | Source: Home / Self Care | Attending: Family Medicine | Admitting: Family Medicine

## 2020-11-16 ENCOUNTER — Emergency Department (INDEPENDENT_AMBULATORY_CARE_PROVIDER_SITE_OTHER): Payer: BC Managed Care – PPO

## 2020-11-16 ENCOUNTER — Other Ambulatory Visit: Payer: Self-pay

## 2020-11-16 DIAGNOSIS — M25572 Pain in left ankle and joints of left foot: Secondary | ICD-10-CM | POA: Diagnosis not present

## 2020-11-16 DIAGNOSIS — S82402A Unspecified fracture of shaft of left fibula, initial encounter for closed fracture: Secondary | ICD-10-CM

## 2020-11-16 DIAGNOSIS — M7989 Other specified soft tissue disorders: Secondary | ICD-10-CM | POA: Diagnosis not present

## 2020-11-16 DIAGNOSIS — S99912A Unspecified injury of left ankle, initial encounter: Secondary | ICD-10-CM

## 2020-11-16 NOTE — ED Notes (Signed)
Short cam walker applied to L foot.

## 2020-11-16 NOTE — ED Triage Notes (Signed)
Pt presents to Urgent Care with c/o L ankle pain and swelling following injury yesterday. Reports twisting her L ankle yesterday. Swelling noted laterally. Pt reports decreased sensation to area, but states this is related to a back injury that occurred in Feb 2022.

## 2020-11-16 NOTE — ED Provider Notes (Signed)
Nancy Beard CARE    CSN: [id number] Arrival date & time: 11/16/20  1723      History   Chief Complaint Chief Complaint  Patient presents with  . Ankle Injury    Left    HPI Nancy Beard is a 65 y.o. female.   HPI 65 year old female presents with left ankle pain for 1 day.  Patient reports injuring left ankle yesterday (11/15/2020) while attempting to get into her car.  She reports rolling her left ankle inward and inadvertently hitting a curb with her left foot prior to getting into her car. Past Medical History:  Diagnosis Date  . Anxiety   . Blood transfusion without reported diagnosis 2014  . Breast cancer ([place]) 2014   right, bil mast  . Celiac disease   . Depression   . DIVERTICULOSIS, COLON 05/05/2007  . DVT (deep venous thrombosis) ([place]) 2014   Right lower extremity, thigh  . DVT (deep venous thrombosis) ([place]) 06/30/2013   August 2014. Continued Xarelto due to persistent chronic DVT R popliteal vein. Plan repeat per oncology March 2016   . Fracture of left tibial plateau 06/15/2015  . Fracture tibia/fibula 06/14/2015  . HEMORRHOIDS, EXTERNAL 05/05/2007  . History of chemotherapy   . Hypothyroidism   . Neuropathy    of fingers and toes  . Pneumonia    hx of years ago  . Thyroid disease   . Vitamin D insufficiency 06/20/2015  . Wears glasses     Patient Active Problem List   Diagnosis Date Noted  . Acute deep vein thrombosis (DVT) of proximal vein of right lower extremity ([place]) see [place] 10/20/20- Xarelto started 10/20/20 10/20/2020  . Breast asymmetry following reconstructive surgery 05/03/2020  . Screening for HIV (human immunodeficiency virus) 04/28/2020  . Need for vaccination with 13-polyvalent pneumococcal conjugate vaccine 04/28/2020  . Need for influenza vaccination 04/28/2020  . Screening for colon cancer 04/28/2020  . Screening for osteoporosis 04/28/2020  . Elevated liver enzymes 11/12/2018  . Prediabetes 11/12/2018  . History of high  cholesterol 11/12/2018  . Herpes simplex 04/12/2017  . Abnormal transaminases 06/27/2015  . Iron deficiency 06/24/2015  . Vitamin D deficiency 06/20/2015  . Genital herpes 09/06/2014  . Neuropathy 09/06/2014  . Anticoagulant long-term use 05/01/2014  . Persistent headaches 02/22/2014  . Hot flashes related to aromatase inhibitor therapy 12/10/2013  . Vulvar intraepithelial neoplasia III (VIN III) 06/30/2013  . Status post bilateral breast reconstruction 06/30/2013  . Osteoporosis 05/13/2013  . Acquired absence of bilateral breasts and nipples 09/23/2012  . Primary cancer of lower-inner quadrant of right female breast ([place]) 06/24/2012  . Hyperlipidemia 05/13/2008  . Hypothyroidism 01/31/2007  . Depression 01/31/2007  . Celiac disease/sprue 01/31/2007    Past Surgical History:  Procedure Laterality Date  . AXILLARY SENTINEL NODE BIOPSY Right 09/11/2012   Procedure: AXILLARY SENTINEL lymph NODE  BIOPSY;  Surgeon: Odis Hollingshead, MD;  Location: [facility];  Service: General;  Laterality: Right;  right nuclear medicine injection 12:30   . BREAST LUMPECTOMY     benign lump  . BREAST RECONSTRUCTION Right 09/24/2013   Procedure: REVISION OF RIGHT BREAST RECONSTRUCTION WITH REPOSITIONING RIGHT IMPLANT, POSSIBLE EXCISION CAPSULAR CONTRACTURE AND LIPOFILLING FOR FAT GRAFTING;  Surgeon: Theodoro Kos, DO;  Location: [facility];  Service: Plastics;  Laterality: Right;  . BREAST RECONSTRUCTION WITH PLACEMENT OF TISSUE EXPANDER AND FLEX HD (ACELLULAR HYDRATED DERMIS) Bilateral 09/11/2012   Procedure: BREAST RECONSTRUCTION WITH PLACEMENT OF TISSUE EXPANDER AND FLEX HD (ACELLULAR HYDRATED  DERMIS) ADM;  Surgeon: Theodoro Kos, DO;  Location: [facility];  Service: Plastics;  Laterality: Bilateral;  . COLONOSCOPY  12/20/2004   Carlean Purl  . cryoblation of cervix     long time ago  . ESOPHAGOGASTRODUODENOSCOPY  2007   sprue  . EXTERNAL FIXATION LEG Left 06/15/2015   Procedure: EXTERNAL FIXATION  LEG;  Surgeon: Altamese Marathon, MD;  Location: [facility];  Service: Orthopedics;  Laterality: Left;  . INCISION AND DRAINAGE OF WOUND Left 09/16/2012   Procedure: Left Breast Evacuation of Hematoma;  Surgeon: Theodoro Kos, DO;  Location: [facility];  Service: Plastics;  Laterality: Left;  . LIPOSUCTION WITH LIPOFILLING Bilateral 09/24/2013   Procedure: LIPOSUCTION WITH LIPOFILLING;  Surgeon: Theodoro Kos, DO;  Location: [facility];  Service: Plastics;  Laterality: Bilateral;  [place] filling breast  . LIPOSUCTION WITH LIPOFILLING Bilateral 06/08/2020   Procedure: LIPOSUCTION WITH LIPOFILLING;  Surgeon: Wallace Going, DO;  Location: [facility];  Service: Plastics;  Laterality: Bilateral;  90 min, please  . ORIF TIBIA PLATEAU Left 06/21/2015   Procedure: OPEN REDUCTION INTERNAL FIXATION (ORIF) TIBIAL PLATEAU REMOVAL OF EXTERNAL FISTULA;  Surgeon: Altamese , MD;  Location: [facility];  Service: Orthopedics;  Laterality: Left;  . PORT-A-CATH REMOVAL Right 08/07/2013   Procedure: REMOVAL PORT-A-CATH;  Surgeon: Odis Hollingshead, MD;  Location: [facility];  Service: General;  Laterality: Right;  . PORTACATH PLACEMENT Right 10/09/2012   Procedure: US GUIDED INSERTION PORT-A-CATH;  Surgeon: Odis Hollingshead, MD;  Location: [facility];  Service: General;  Laterality: Right;  Right Subclavian Vein  . REMOVAL OF BILATERAL TISSUE EXPANDERS WITH PLACEMENT OF BILATERAL BREAST IMPLANTS Bilateral 06/24/2013   Procedure: REMOVAL OF BILATERAL TISSUE EXPANDERS WITH PLACEMENT OF BILATERAL BREAST IMPLANTS;  Surgeon: Theodoro Kos, DO;  Location: [facility];  Service: Plastics;  Laterality: Bilateral;  . TOTAL MASTECTOMY Bilateral 09/11/2012   Procedure: bilateral MASTECTOMY;  Surgeon: Odis Hollingshead, MD;  Location: [facility];  Service: General;  Laterality: Bilateral;  . VULVECTOMY N/A 07/14/2013   Procedure: WIDE LOCAL  EXCISION VULVAR;  Surgeon:  Alvino Chapel, MD;  Location: [facility];  Service: Gynecology;  Laterality: N/A;    OB History   No obstetric history on file.      Home Medications    Prior to Admission medications   Medication Sig Start Date End Date Taking? Authorizing Provider  acetaminophen (TYLENOL) 325 MG tablet Take by mouth. 09/28/20   [provider]  AMBULATORY NON FORMULARY MEDICATION BLOOD PRESSURE MONITOR - ARM CUFF PER PATIENT PREFERENCE / INSURANCE COVERAGE, DX: ELEVATED BLOOD PRESSURE 02/22/20   Emeterio Reeve, DO  baclofen (LIORESAL) 10 MG tablet Take 5 mg by mouth 2 (two) times daily as needed. 09/28/20   [provider]  Cholecalciferol (VITAMIN D-3 PO) Take 1 tablet by mouth daily.  Patient not taking: Reported on 10/17/2020    [provider]  ferrous sulfate 325 (65 FE) MG EC tablet Take one tablet (325 mg) three days a week spaced apart. 04/28/20   Orma Render, NP  gabapentin (NEURONTIN) 300 MG capsule Take by mouth. 10/12/20   [provider]  levothyroxine (SYNTHROID) 112 MCG tablet TAKE 1 TABLET (112 MCG TOTAL) BY MOUTH DAILY BEFORE BREAKFAST. 08/15/20   Emeterio Reeve, DO  Multiple Vitamin (MULTIVITAMIN) tablet Take 1 tablet by mouth daily. Patient not taking: Reported on 10/17/2020    [provider]  ondansetron (ZOFRAN) 4 MG tablet Take 1  tablet (4 mg total) by mouth every 8 (eight) hours as needed for nausea or vomiting. 06/01/20   Young, Johanna C, PA-C  oxyCODONE-acetaminophen (PERCOCET/ROXICET) 5-325 MG tablet Take 0.5 tablets by mouth every 8 (eight) hours. Prn severe pain 10/21/20 11/20/20  Emeterio Reeve, DO  prochlorperazine (COMPAZINE) 5 MG tablet Take 1 tablet (5 mg total) by mouth every 6 (six) hours as needed for nausea or vomiting. 04/28/20   Orma Render, NP  Rivaroxaban (XARELTO) 15 MG TABS tablet Take 1 tablet (15 mg total) by mouth 2 (two) times daily with a meal for 21 days. 10/21/20 11/11/20  Emeterio Reeve, DO   rivaroxaban (XARELTO) 20 MG TABS tablet Take 1 tablet (20 mg total) by mouth daily with supper. start 11/10/20, ok to fill today 10/20/20. 10/20/20   Emeterio Reeve, DO  traMADol (ULTRAM) 50 MG tablet Take 1 tablet (50 mg total) by mouth every 8 (eight) hours as needed for severe pain. For use AFTER surgery 06/01/20   Phoebe Sharps C, PA-C  valACYclovir (VALTREX) 1000 MG tablet Take 2 tablets by mouth 2 (two) times daily as needed (OUTBREAK).     [provider]  venlafaxine XR (EFFEXOR-XR) 150 MG 24 hr capsule Take 1 capsule (150 mg total) by mouth daily with breakfast. 02/22/20   Emeterio Reeve, DO    Family History Family History  Problem Relation Age of Onset  . Breast cancer Paternal Aunt        dx in her 40s  . Lymphoma Paternal Uncle   . Breast cancer Paternal Grandmother        dx >50  . Heart attack Paternal Grandfather   . Breast cancer Other        2 maternal great aunts with breast cancer >50  . Osteoporosis Mother        controlled with diet/exercise  . COPD Father   . Colon cancer Neg Hx   . Colon polyps Neg Hx   . Esophageal cancer Neg Hx   . Rectal cancer Neg Hx   . Stomach cancer Neg Hx     Social History Social History   Tobacco Use  . Smoking status: Never Smoker  . Smokeless tobacco: Never Used  Vaping Use  . Vaping Use: Never used  Substance Use Topics  . Alcohol use: No  . Drug use: No     Allergies   Ciprofloxacin, Hydromorphone hcl, Nitrofurantoin, and Gluten meal   Review of Systems Review of Systems  Constitutional: Negative.   HENT: Negative.   Eyes: Negative.   Respiratory: Negative.   Cardiovascular: Negative.   Gastrointestinal: Negative.   Endocrine: Negative.   Genitourinary: Negative.   Musculoskeletal: Positive for joint swelling.       Left ankle: positive for swelling  Skin: Negative.   Neurological: Negative.      Physical Exam Triage Vital Signs ED Triage Vitals  Enc Vitals Group     BP 11/16/20  1739 (!) 151/93     Pulse Rate 11/16/20 1739 (!) 106     Resp 11/16/20 1739 20     Temp 11/16/20 1739 98.8 F (37.1 C)     Temp Source 11/16/20 1739 Oral     SpO2 11/16/20 1739 96 %     Weight 11/16/20 1736 170 lb (77.1 kg)     Height 11/16/20 1736 5\' 6"  (1.676 m)     Head Circumference --      Peak Flow --      Pain Score  11/16/20 1736 0     Pain Loc --      Pain Edu? --      Excl. in [place]? --    No data found.  Updated Vital Signs BP (!) 151/93   Pulse (!) 106   Temp 98.8 F (37.1 C) (Oral)   Resp 20   Ht 5\' 6"  (1.676 m)   Wt 170 lb (77.1 kg)   LMP 09/11/2012   SpO2 96%   BMI 27.44 kg/m    Physical Exam Constitutional:      Appearance: Normal appearance.  HENT:     Head: Normocephalic and atraumatic.  Eyes:     Extraocular Movements: Extraocular movements intact.     Pupils: Pupils are equal, round, and reactive to light.  Cardiovascular:     Rate and Rhythm: Normal rate and regular rhythm.     Pulses: Normal pulses.     Heart sounds: Normal heart sounds.  Pulmonary:     Effort: Pulmonary effort is normal.     Breath sounds: Normal breath sounds.  Musculoskeletal:     Comments: Left Ankle: TTP over lateral malleolus with moderate soft tissue swelling noted, mild pain elicited with active inversion/eversion.   Skin:    General: Skin is warm and dry.  Neurological:     General: No focal deficit present.     Mental Status: She is alert and oriented to person, place, and time.  Psychiatric:        Mood and Affect: Mood normal.        Behavior: Behavior normal.      UC Treatments / Results  Labs (all labs ordered are listed, but only abnormal results are displayed) Labs Reviewed - No data to display  EKG   Radiology DG Ankle Complete Left  Result Date: 11/16/2020 CLINICAL DATA:  Left ankle pain since a twisting injury stepping off a curb yesterday. Initial encounter. EXAM: LEFT ANKLE COMPLETE - 3+ VIEW COMPARISON:  None. FINDINGS: There is lateral  soft tissue swelling with a chip fracture off the distal most fibula. No other abnormality is identified. IMPRESSION: Chip fractures off the distal fibula with associated soft tissue swelling. Electronically Signed   By: Inge Rise M.D.   On: 11/16/2020 18:25    Procedures Procedures (including critical care time)  Medications Ordered in UC Medications - No data to display  Initial Impression / Assessment and Plan / UC Course  I have reviewed the triage vital signs and the nursing notes.  Pertinent labs & imaging results that were available during my care of the patient were reviewed by me and considered in my medical decision making (see chart for details).    MDM: 1.  Closed fracture of shaft of left fibula, 2. Left ankle pain.  Patient discharged in left low cam boot, provided instructions to follow-up with [place] Dianah Field, MD) tomorrow (11/17/20) for further evaluation.  Final Clinical Impressions(s) / UC Diagnoses   Final diagnoses:  Acute left ankle pain  Closed fracture of shaft of left fibula, unspecified fracture morphology, initial encounter     Discharge Instructions     Advised patient may take Tylenol (1000 mg) twice daily for left ankle pain for 7-10 days, avoid NSAIDs due to current Rivaroxaban 20 mg daily.  Advised/encouraged patient to RICE left ankle for 20 minutes 2-3 times 3 to 5 days.    ED Prescriptions    None     PDMP not reviewed this encounter.   Alexianna Nachreiner,  [place], [place] 11/16/20 [phone]

## 2020-11-16 NOTE — Discharge Instructions (Addendum)
Advised patient may take Tylenol (1000 mg) twice daily for left ankle pain for 7-10 days, avoid NSAIDs due to current Rivaroxaban 20 mg daily.  Advised/encouraged patient to RICE left ankle for 20 minutes 2-3 times 3 to 5 days.

## 2020-11-17 ENCOUNTER — Telehealth: Payer: Self-pay

## 2020-11-17 NOTE — Telephone Encounter (Signed)
Called pt and lvm to call us to make an appt. I will continue to call the patient-tvt

## 2020-11-17 NOTE — Telephone Encounter (Signed)
Pt called stating she has injured her left ankle and has lost feeling in her foot. Per pt, she mentioned that her foot is turning "blue". Pt was seen in the ER yesterday. Pls contact the patient to schedule an appt with provider.

## 2020-11-18 ENCOUNTER — Ambulatory Visit (INDEPENDENT_AMBULATORY_CARE_PROVIDER_SITE_OTHER): Payer: BC Managed Care – PPO | Admitting: Sports Medicine

## 2020-11-18 ENCOUNTER — Encounter: Payer: Self-pay | Admitting: Sports Medicine

## 2020-11-18 ENCOUNTER — Other Ambulatory Visit: Payer: Self-pay

## 2020-11-18 DIAGNOSIS — S32012S Unstable burst fracture of first lumbar vertebra, sequela: Secondary | ICD-10-CM

## 2020-11-18 DIAGNOSIS — M8588 Other specified disorders of bone density and structure, other site: Secondary | ICD-10-CM | POA: Diagnosis not present

## 2020-11-18 DIAGNOSIS — S82832A Other fracture of upper and lower end of left fibula, initial encounter for closed fracture: Secondary | ICD-10-CM

## 2020-11-18 DIAGNOSIS — S32019A Unspecified fracture of first lumbar vertebra, initial encounter for closed fracture: Secondary | ICD-10-CM | POA: Insufficient documentation

## 2020-11-18 MED ORDER — GABAPENTIN 600 MG PO TABS: ORAL_TABLET | ORAL | 3 refills | Status: DC

## 2020-11-18 NOTE — Assessment & Plan Note (Addendum)
3 months ago Nancy Beard did have a fall with an L1 burst fracture with spinal cord compression, she is still incontinent, she is working with spine surgery and urology. She does have some numbness below the ankle, but good motion and good strength.  She does get occasional paresthesias, as well as burning sensations through the day so I am going to increase her gabapentin to 1200 mg 3 times daily.

## 2020-11-18 NOTE — Assessment & Plan Note (Signed)
Nancy Beard also has osteopenia, most recent bone density test showed a T score of -2.1, however combined with her recent L1 burst fracture with relatively low trauma and now her fibular fracture I do think we should treat this as osteoporosis, we will discuss this in further detail at the follow-up visit, we will likely use Fosamax. Continue calcium and vitamin D supplementation twice daily.

## 2020-11-18 NOTE — Assessment & Plan Note (Signed)
A couple of days ago this pleasant 65 year old female who has a history of a lumbar compression fracture with hemiparesis/hemianesthesia postsurgical decompression took a misstep out of her car, and rolled her ankle on the curb. She had immediate swelling and bruising but no pain as she has difficulty with sensation in her ankle. She was seen and x-rays were performed that showed an avulsion fracture from the distal fibula, she is referred to me for further evaluation, she was appropriately placed in a boot, on evaluation she has a stable ankle, negative anterior drawer sign, no tenderness to palpation due to neurologic injury, moderate swelling. We will continue the boot when up and about, I would like her to start some physical therapy for the ankle in a few weeks, she is already doing home health PT for her spinal cord injury, we can use the same agency. She will avoid icing and avoid excessive compression such as using the air bladders in the boot considering her abnormal sensation in her ankle. Return to see me in a month. Weightbearing as tolerated only in the boot for now. After a month we will probably advance her to outpatient physical therapy and transition to an ASO.

## 2020-11-18 NOTE — Progress Notes (Addendum)
    Procedures performed today:    None.  Independent interpretation of notes and tests performed by another provider:   X-rays personally reviewed and show a tiny avulsion from the distal fibula.  Brief History, Exam, Impression, and Recommendations:    Closed avulsion fracture of distal fibula, left, initial encounter A couple of days ago this pleasant 65 year old female who has a history of a lumbar compression fracture with hemiparesis/hemianesthesia postsurgical decompression took a misstep out of her car, and rolled her ankle on the curb. She had immediate swelling and bruising but no pain as she has difficulty with sensation in her ankle. She was seen and x-rays were performed that showed an avulsion fracture from the distal fibula, she is referred to me for further evaluation, she was appropriately placed in a boot, on evaluation she has a stable ankle, negative anterior drawer sign, no tenderness to palpation due to neurologic injury, moderate swelling. We will continue the boot when up and about, I would like her to start some physical therapy for the ankle in a few weeks, she is already doing home health PT for her spinal cord injury, we can use the same agency. She will avoid icing and avoid excessive compression such as using the air bladders in the boot considering her abnormal sensation in her ankle. Return to see me in a month. Weightbearing as tolerated only in the boot for now. After a month we will probably advance her to outpatient physical therapy and transition to an ASO.  L1 burst fracture with instrumentation and spinal cord compression 3 months ago [name] did have a fall with an L1 burst fracture with spinal cord compression, she is still incontinent, she is working with spine surgery and urology. She does have some numbness below the ankle, but good motion and good strength.  She does get occasional paresthesias, as well as burning sensations through the day so I am  going to increase her gabapentin to 1200 mg 3 times daily.  Osteopenia with pathologic fracture [name] also has osteopenia, most recent bone density test showed a T score of -2.1, however combined with her recent L1 burst fracture with relatively low trauma and now her fibular fracture I do think we should treat this as osteoporosis, we will discuss this in further detail at the follow-up visit, we will likely use Fosamax. Continue calcium and vitamin D supplementation twice daily.    ___________________________________________ Gwen Her. Dianah Field, M.D., ABFM., CAQSM. Primary Care and [place] Instructor of [place] [employer] [facility] [employer]

## 2020-11-21 ENCOUNTER — Other Ambulatory Visit: Payer: Self-pay | Admitting: Osteopathic Medicine

## 2020-11-21 DIAGNOSIS — I824Y1 Acute embolism and thrombosis of unspecified deep veins of right proximal lower extremity: Secondary | ICD-10-CM

## 2020-11-23 ENCOUNTER — Telehealth: Payer: Self-pay

## 2020-11-23 NOTE — Telephone Encounter (Signed)
Physical therapist Leda Gauze from [facility] called requesting for patient to have home health physical therapy. Returned a call back to [facility], no answer. Left a detailed vm msg to proceed with requested orders.

## 2020-11-24 DIAGNOSIS — F32A Depression, unspecified: Secondary | ICD-10-CM | POA: Diagnosis not present

## 2020-11-24 DIAGNOSIS — S32012D Unstable burst fracture of first lumbar vertebra, subsequent encounter for fracture with routine healing: Secondary | ICD-10-CM | POA: Diagnosis not present

## 2020-11-24 DIAGNOSIS — N319 Neuromuscular dysfunction of bladder, unspecified: Secondary | ICD-10-CM | POA: Diagnosis not present

## 2020-11-24 DIAGNOSIS — M5030 Other cervical disc degeneration, unspecified cervical region: Secondary | ICD-10-CM | POA: Diagnosis not present

## 2020-11-24 DIAGNOSIS — K5730 Diverticulosis of large intestine without perforation or abscess without bleeding: Secondary | ICD-10-CM | POA: Diagnosis not present

## 2020-11-24 DIAGNOSIS — S065X0D Traumatic subdural hemorrhage without loss of consciousness, subsequent encounter: Secondary | ICD-10-CM | POA: Diagnosis not present

## 2020-11-24 DIAGNOSIS — Z86718 Personal history of other venous thrombosis and embolism: Secondary | ICD-10-CM | POA: Diagnosis not present

## 2020-11-24 DIAGNOSIS — E039 Hypothyroidism, unspecified: Secondary | ICD-10-CM | POA: Diagnosis not present

## 2020-11-24 DIAGNOSIS — K529 Noninfective gastroenteritis and colitis, unspecified: Secondary | ICD-10-CM | POA: Diagnosis not present

## 2020-11-24 DIAGNOSIS — K449 Diaphragmatic hernia without obstruction or gangrene: Secondary | ICD-10-CM | POA: Diagnosis not present

## 2020-11-24 DIAGNOSIS — W108XXD Fall (on) (from) other stairs and steps, subsequent encounter: Secondary | ICD-10-CM | POA: Diagnosis not present

## 2020-11-24 DIAGNOSIS — R911 Solitary pulmonary nodule: Secondary | ICD-10-CM | POA: Diagnosis not present

## 2020-11-25 ENCOUNTER — Telehealth: Payer: Self-pay

## 2020-11-25 ENCOUNTER — Other Ambulatory Visit: Payer: Self-pay | Admitting: Osteopathic Medicine

## 2020-11-25 NOTE — Telephone Encounter (Signed)
Pt called requesting a letter to be excuse from jury duty due to her current health. Letter pended for provider's review. Per pt, jury number is [phone] and she must submit the letter no later than 12/07/2020.

## 2020-11-29 DIAGNOSIS — M80062D Age-related osteoporosis with current pathological fracture, left lower leg, subsequent encounter for fracture with routine healing: Secondary | ICD-10-CM | POA: Diagnosis not present

## 2020-11-29 DIAGNOSIS — E039 Hypothyroidism, unspecified: Secondary | ICD-10-CM | POA: Diagnosis not present

## 2020-11-29 DIAGNOSIS — G8222 Paraplegia, incomplete: Secondary | ICD-10-CM | POA: Diagnosis not present

## 2020-11-29 DIAGNOSIS — S32012S Unstable burst fracture of first lumbar vertebra, sequela: Secondary | ICD-10-CM | POA: Diagnosis not present

## 2020-11-29 DIAGNOSIS — M5030 Other cervical disc degeneration, unspecified cervical region: Secondary | ICD-10-CM | POA: Diagnosis not present

## 2020-11-29 DIAGNOSIS — F32A Depression, unspecified: Secondary | ICD-10-CM | POA: Diagnosis not present

## 2020-11-29 DIAGNOSIS — K592 Neurogenic bowel, not elsewhere classified: Secondary | ICD-10-CM | POA: Diagnosis not present

## 2020-11-29 DIAGNOSIS — N319 Neuromuscular dysfunction of bladder, unspecified: Secondary | ICD-10-CM | POA: Diagnosis not present

## 2020-11-29 DIAGNOSIS — K449 Diaphragmatic hernia without obstruction or gangrene: Secondary | ICD-10-CM | POA: Diagnosis not present

## 2020-11-29 DIAGNOSIS — K5730 Diverticulosis of large intestine without perforation or abscess without bleeding: Secondary | ICD-10-CM | POA: Diagnosis not present

## 2020-11-29 DIAGNOSIS — S0003XD Contusion of scalp, subsequent encounter: Secondary | ICD-10-CM | POA: Diagnosis not present

## 2020-11-29 DIAGNOSIS — R911 Solitary pulmonary nodule: Secondary | ICD-10-CM | POA: Diagnosis not present

## 2020-11-29 DIAGNOSIS — S065X0D Traumatic subdural hemorrhage without loss of consciousness, subsequent encounter: Secondary | ICD-10-CM | POA: Diagnosis not present

## 2020-11-30 NOTE — Telephone Encounter (Signed)
Signed off on letter, thanks

## 2020-11-30 NOTE — Telephone Encounter (Signed)
Task completed. Pt has been updated regarding the letter. Pt will stop by the clinic tomorrow afternoon to pick up the letter. Place in the front desk's accordion folder.

## 2020-12-01 DIAGNOSIS — M8589 Other specified disorders of bone density and structure, multiple sites: Secondary | ICD-10-CM | POA: Diagnosis not present

## 2020-12-01 DIAGNOSIS — M5030 Other cervical disc degeneration, unspecified cervical region: Secondary | ICD-10-CM | POA: Diagnosis not present

## 2020-12-01 DIAGNOSIS — G8222 Paraplegia, incomplete: Secondary | ICD-10-CM | POA: Diagnosis not present

## 2020-12-01 DIAGNOSIS — K219 Gastro-esophageal reflux disease without esophagitis: Secondary | ICD-10-CM | POA: Diagnosis not present

## 2020-12-01 DIAGNOSIS — N319 Neuromuscular dysfunction of bladder, unspecified: Secondary | ICD-10-CM | POA: Diagnosis not present

## 2020-12-01 DIAGNOSIS — K900 Celiac disease: Secondary | ICD-10-CM | POA: Diagnosis not present

## 2020-12-01 DIAGNOSIS — F32A Depression, unspecified: Secondary | ICD-10-CM | POA: Diagnosis not present

## 2020-12-01 DIAGNOSIS — S065X0D Traumatic subdural hemorrhage without loss of consciousness, subsequent encounter: Secondary | ICD-10-CM | POA: Diagnosis not present

## 2020-12-01 DIAGNOSIS — R911 Solitary pulmonary nodule: Secondary | ICD-10-CM | POA: Diagnosis not present

## 2020-12-01 DIAGNOSIS — K449 Diaphragmatic hernia without obstruction or gangrene: Secondary | ICD-10-CM | POA: Diagnosis not present

## 2020-12-01 DIAGNOSIS — Z780 Asymptomatic menopausal state: Secondary | ICD-10-CM | POA: Diagnosis not present

## 2020-12-01 DIAGNOSIS — K592 Neurogenic bowel, not elsewhere classified: Secondary | ICD-10-CM | POA: Diagnosis not present

## 2020-12-01 DIAGNOSIS — E039 Hypothyroidism, unspecified: Secondary | ICD-10-CM | POA: Diagnosis not present

## 2020-12-01 DIAGNOSIS — K5730 Diverticulosis of large intestine without perforation or abscess without bleeding: Secondary | ICD-10-CM | POA: Diagnosis not present

## 2020-12-01 DIAGNOSIS — S32012S Unstable burst fracture of first lumbar vertebra, sequela: Secondary | ICD-10-CM | POA: Diagnosis not present

## 2020-12-01 DIAGNOSIS — S0003XD Contusion of scalp, subsequent encounter: Secondary | ICD-10-CM | POA: Diagnosis not present

## 2020-12-01 DIAGNOSIS — M80062D Age-related osteoporosis with current pathological fracture, left lower leg, subsequent encounter for fracture with routine healing: Secondary | ICD-10-CM | POA: Diagnosis not present

## 2020-12-01 DIAGNOSIS — Z9181 History of falling: Secondary | ICD-10-CM | POA: Diagnosis not present

## 2020-12-02 DIAGNOSIS — Z4789 Encounter for other orthopedic aftercare: Secondary | ICD-10-CM | POA: Diagnosis not present

## 2020-12-02 DIAGNOSIS — Z981 Arthrodesis status: Secondary | ICD-10-CM | POA: Diagnosis not present

## 2020-12-02 DIAGNOSIS — S32011A Stable burst fracture of first lumbar vertebra, initial encounter for closed fracture: Secondary | ICD-10-CM | POA: Diagnosis not present

## 2020-12-06 DIAGNOSIS — K449 Diaphragmatic hernia without obstruction or gangrene: Secondary | ICD-10-CM | POA: Diagnosis not present

## 2020-12-06 DIAGNOSIS — S065X0D Traumatic subdural hemorrhage without loss of consciousness, subsequent encounter: Secondary | ICD-10-CM | POA: Diagnosis not present

## 2020-12-06 DIAGNOSIS — S32012S Unstable burst fracture of first lumbar vertebra, sequela: Secondary | ICD-10-CM | POA: Diagnosis not present

## 2020-12-06 DIAGNOSIS — K5730 Diverticulosis of large intestine without perforation or abscess without bleeding: Secondary | ICD-10-CM | POA: Diagnosis not present

## 2020-12-06 DIAGNOSIS — K592 Neurogenic bowel, not elsewhere classified: Secondary | ICD-10-CM | POA: Diagnosis not present

## 2020-12-06 DIAGNOSIS — F32A Depression, unspecified: Secondary | ICD-10-CM | POA: Diagnosis not present

## 2020-12-06 DIAGNOSIS — M5030 Other cervical disc degeneration, unspecified cervical region: Secondary | ICD-10-CM | POA: Diagnosis not present

## 2020-12-06 DIAGNOSIS — R911 Solitary pulmonary nodule: Secondary | ICD-10-CM | POA: Diagnosis not present

## 2020-12-06 DIAGNOSIS — M80062D Age-related osteoporosis with current pathological fracture, left lower leg, subsequent encounter for fracture with routine healing: Secondary | ICD-10-CM | POA: Diagnosis not present

## 2020-12-06 DIAGNOSIS — E039 Hypothyroidism, unspecified: Secondary | ICD-10-CM | POA: Diagnosis not present

## 2020-12-06 DIAGNOSIS — G8222 Paraplegia, incomplete: Secondary | ICD-10-CM | POA: Diagnosis not present

## 2020-12-06 DIAGNOSIS — S0003XD Contusion of scalp, subsequent encounter: Secondary | ICD-10-CM | POA: Diagnosis not present

## 2020-12-06 DIAGNOSIS — N319 Neuromuscular dysfunction of bladder, unspecified: Secondary | ICD-10-CM | POA: Diagnosis not present

## 2020-12-08 DIAGNOSIS — R911 Solitary pulmonary nodule: Secondary | ICD-10-CM | POA: Diagnosis not present

## 2020-12-08 DIAGNOSIS — F32A Depression, unspecified: Secondary | ICD-10-CM | POA: Diagnosis not present

## 2020-12-08 DIAGNOSIS — G8222 Paraplegia, incomplete: Secondary | ICD-10-CM | POA: Diagnosis not present

## 2020-12-08 DIAGNOSIS — S0003XD Contusion of scalp, subsequent encounter: Secondary | ICD-10-CM | POA: Diagnosis not present

## 2020-12-08 DIAGNOSIS — E039 Hypothyroidism, unspecified: Secondary | ICD-10-CM | POA: Diagnosis not present

## 2020-12-08 DIAGNOSIS — K5730 Diverticulosis of large intestine without perforation or abscess without bleeding: Secondary | ICD-10-CM | POA: Diagnosis not present

## 2020-12-08 DIAGNOSIS — K592 Neurogenic bowel, not elsewhere classified: Secondary | ICD-10-CM | POA: Diagnosis not present

## 2020-12-08 DIAGNOSIS — S065X0D Traumatic subdural hemorrhage without loss of consciousness, subsequent encounter: Secondary | ICD-10-CM | POA: Diagnosis not present

## 2020-12-08 DIAGNOSIS — N319 Neuromuscular dysfunction of bladder, unspecified: Secondary | ICD-10-CM | POA: Diagnosis not present

## 2020-12-08 DIAGNOSIS — S32012S Unstable burst fracture of first lumbar vertebra, sequela: Secondary | ICD-10-CM | POA: Diagnosis not present

## 2020-12-08 DIAGNOSIS — M5030 Other cervical disc degeneration, unspecified cervical region: Secondary | ICD-10-CM | POA: Diagnosis not present

## 2020-12-08 DIAGNOSIS — K449 Diaphragmatic hernia without obstruction or gangrene: Secondary | ICD-10-CM | POA: Diagnosis not present

## 2020-12-08 DIAGNOSIS — M80062D Age-related osteoporosis with current pathological fracture, left lower leg, subsequent encounter for fracture with routine healing: Secondary | ICD-10-CM | POA: Diagnosis not present

## 2020-12-13 ENCOUNTER — Telehealth: Payer: Self-pay

## 2020-12-13 DIAGNOSIS — N319 Neuromuscular dysfunction of bladder, unspecified: Secondary | ICD-10-CM | POA: Diagnosis not present

## 2020-12-13 DIAGNOSIS — M80062D Age-related osteoporosis with current pathological fracture, left lower leg, subsequent encounter for fracture with routine healing: Secondary | ICD-10-CM | POA: Diagnosis not present

## 2020-12-13 DIAGNOSIS — K5730 Diverticulosis of large intestine without perforation or abscess without bleeding: Secondary | ICD-10-CM | POA: Diagnosis not present

## 2020-12-13 DIAGNOSIS — R911 Solitary pulmonary nodule: Secondary | ICD-10-CM | POA: Diagnosis not present

## 2020-12-13 DIAGNOSIS — S065X0D Traumatic subdural hemorrhage without loss of consciousness, subsequent encounter: Secondary | ICD-10-CM | POA: Diagnosis not present

## 2020-12-13 DIAGNOSIS — S0003XD Contusion of scalp, subsequent encounter: Secondary | ICD-10-CM | POA: Diagnosis not present

## 2020-12-13 DIAGNOSIS — K592 Neurogenic bowel, not elsewhere classified: Secondary | ICD-10-CM | POA: Diagnosis not present

## 2020-12-13 DIAGNOSIS — M5030 Other cervical disc degeneration, unspecified cervical region: Secondary | ICD-10-CM | POA: Diagnosis not present

## 2020-12-13 DIAGNOSIS — E039 Hypothyroidism, unspecified: Secondary | ICD-10-CM | POA: Diagnosis not present

## 2020-12-13 DIAGNOSIS — G8222 Paraplegia, incomplete: Secondary | ICD-10-CM | POA: Diagnosis not present

## 2020-12-13 DIAGNOSIS — F32A Depression, unspecified: Secondary | ICD-10-CM | POA: Diagnosis not present

## 2020-12-13 DIAGNOSIS — K449 Diaphragmatic hernia without obstruction or gangrene: Secondary | ICD-10-CM | POA: Diagnosis not present

## 2020-12-13 DIAGNOSIS — S32012S Unstable burst fracture of first lumbar vertebra, sequela: Secondary | ICD-10-CM | POA: Diagnosis not present

## 2020-12-13 NOTE — Telephone Encounter (Signed)
Patient has moved her appt with Dr T up this week til tomorrow to discuss this issue. AM

## 2020-12-13 NOTE — Telephone Encounter (Signed)
Pt called with concerns of sharp shooting leg pain. Per pt, she does not think it is another clot. Pt mentioned that gabapentin and pain medication are not helping to reduce pain. Please contact the patient to schedule an appointment for an evaluation. Thanks in advance.

## 2020-12-14 ENCOUNTER — Ambulatory Visit (INDEPENDENT_AMBULATORY_CARE_PROVIDER_SITE_OTHER): Payer: BC Managed Care – PPO | Admitting: Sports Medicine

## 2020-12-14 ENCOUNTER — Other Ambulatory Visit: Payer: Self-pay

## 2020-12-14 DIAGNOSIS — R339 Retention of urine, unspecified: Secondary | ICD-10-CM | POA: Diagnosis not present

## 2020-12-14 DIAGNOSIS — Z780 Asymptomatic menopausal state: Secondary | ICD-10-CM | POA: Diagnosis not present

## 2020-12-14 DIAGNOSIS — S82832A Other fracture of upper and lower end of left fibula, initial encounter for closed fracture: Secondary | ICD-10-CM

## 2020-12-14 DIAGNOSIS — Z9181 History of falling: Secondary | ICD-10-CM | POA: Diagnosis not present

## 2020-12-14 DIAGNOSIS — I824Y1 Acute embolism and thrombosis of unspecified deep veins of right proximal lower extremity: Secondary | ICD-10-CM | POA: Diagnosis not present

## 2020-12-14 DIAGNOSIS — S32012S Unstable burst fracture of first lumbar vertebra, sequela: Secondary | ICD-10-CM | POA: Diagnosis not present

## 2020-12-14 DIAGNOSIS — E559 Vitamin D deficiency, unspecified: Secondary | ICD-10-CM | POA: Diagnosis not present

## 2020-12-14 DIAGNOSIS — M810 Age-related osteoporosis without current pathological fracture: Secondary | ICD-10-CM | POA: Diagnosis not present

## 2020-12-14 MED ORDER — AMITRIPTYLINE HCL 50 MG PO TABS: ORAL_TABLET | ORAL | 3 refills | Status: DC

## 2020-12-14 MED ORDER — TRIAZOLAM 0.25 MG PO TABS: ORAL_TABLET | ORAL | 0 refills | Status: DC

## 2020-12-14 MED ORDER — RIVAROXABAN 20 MG PO TABS: 20.0000 mg | ORAL_TABLET | Freq: Every day | ORAL | 0 refills | Status: DC

## 2020-12-14 NOTE — Progress Notes (Signed)
    Procedures performed today:    None.  Independent interpretation of notes and tests performed by another provider:   None.  Brief History, Exam, Impression, and Recommendations:    Acute deep vein thrombosis (DVT) of proximal vein of right lower extremity ([place]) see [place] 10/20/20- Xarelto started 10/20/20 It sounds like Nancy Beard has had several DVTs in the past, she really needs to be on Xarelto lifelong, refilling this, her PCP can continue prescriptions from here.  Closed avulsion fracture of distal fibula, left, initial encounter Nancy Beard returns, she is now approximately 4 weeks post fibular avulsion fracture, she is for the most part a sensate due to a spinal cord injury, swelling is significantly better, good strength, good motion. Discontinue boot, we can transition into an ASO for the next 2 to 3 months to avoid reinjury. Return as needed for this.  L1 burst fracture with instrumentation and spinal cord compression Approximately 4 months ago Nancy Beard did have a fall with an L1 burst fracture, spinal cord compression with persistent incontinence. She is working with spine surgery, she has had instrumentation across the burst fracture. Gabapentin at 1200 mg 3 times daily has not provided sufficient pain relief. She is having increasing achiness in her left calf, likely radicular, adding amitriptyline at bedtime for now, I would like an updated MRI, her pain medication needs to come from her PCP, and she needs to keep close follow-up with her spine surgeon. Triazolam for preprocedural anxiolysis per her request.    ___________________________________________ Gwen Her. Dianah Field, M.D., ABFM., CAQSM. Primary Care and [place] Instructor of [place] [facility] [employer]

## 2020-12-14 NOTE — Assessment & Plan Note (Signed)
It sounds like Nancy Beard has had several DVTs in the past, she really needs to be on Xarelto lifelong, refilling this, her PCP can continue prescriptions from here.

## 2020-12-14 NOTE — Assessment & Plan Note (Signed)
Approximately 4 months ago Nancy Beard did have a fall with an L1 burst fracture, spinal cord compression with persistent incontinence. She is working with spine surgery, she has had instrumentation across the burst fracture. Gabapentin at 1200 mg 3 times daily has not provided sufficient pain relief. She is having increasing achiness in her left calf, likely radicular, adding amitriptyline at bedtime for now, I would like an updated MRI, her pain medication needs to come from her PCP, and she needs to keep close follow-up with her spine surgeon. Triazolam for preprocedural anxiolysis per her request.

## 2020-12-14 NOTE — Assessment & Plan Note (Signed)
Nancy Beard returns, she is now approximately 4 weeks post fibular avulsion fracture, she is for the most part a sensate due to a spinal cord injury, swelling is significantly better, good strength, good motion. Discontinue boot, we can transition into an ASO for the next 2 to 3 months to avoid reinjury. Return as needed for this.

## 2020-12-15 DIAGNOSIS — N319 Neuromuscular dysfunction of bladder, unspecified: Secondary | ICD-10-CM | POA: Diagnosis not present

## 2020-12-15 DIAGNOSIS — K592 Neurogenic bowel, not elsewhere classified: Secondary | ICD-10-CM | POA: Diagnosis not present

## 2020-12-15 DIAGNOSIS — K5730 Diverticulosis of large intestine without perforation or abscess without bleeding: Secondary | ICD-10-CM | POA: Diagnosis not present

## 2020-12-15 DIAGNOSIS — M80062D Age-related osteoporosis with current pathological fracture, left lower leg, subsequent encounter for fracture with routine healing: Secondary | ICD-10-CM | POA: Diagnosis not present

## 2020-12-15 DIAGNOSIS — S065X0D Traumatic subdural hemorrhage without loss of consciousness, subsequent encounter: Secondary | ICD-10-CM | POA: Diagnosis not present

## 2020-12-15 DIAGNOSIS — R911 Solitary pulmonary nodule: Secondary | ICD-10-CM | POA: Diagnosis not present

## 2020-12-15 DIAGNOSIS — M5030 Other cervical disc degeneration, unspecified cervical region: Secondary | ICD-10-CM | POA: Diagnosis not present

## 2020-12-15 DIAGNOSIS — K449 Diaphragmatic hernia without obstruction or gangrene: Secondary | ICD-10-CM | POA: Diagnosis not present

## 2020-12-15 DIAGNOSIS — S32012S Unstable burst fracture of first lumbar vertebra, sequela: Secondary | ICD-10-CM | POA: Diagnosis not present

## 2020-12-15 DIAGNOSIS — G8222 Paraplegia, incomplete: Secondary | ICD-10-CM | POA: Diagnosis not present

## 2020-12-15 DIAGNOSIS — F32A Depression, unspecified: Secondary | ICD-10-CM | POA: Diagnosis not present

## 2020-12-15 DIAGNOSIS — E039 Hypothyroidism, unspecified: Secondary | ICD-10-CM | POA: Diagnosis not present

## 2020-12-15 DIAGNOSIS — S0003XD Contusion of scalp, subsequent encounter: Secondary | ICD-10-CM | POA: Diagnosis not present

## 2020-12-16 ENCOUNTER — Ambulatory Visit: Payer: BC Managed Care – PPO | Admitting: Sports Medicine

## 2020-12-18 ENCOUNTER — Other Ambulatory Visit: Payer: Self-pay

## 2020-12-18 ENCOUNTER — Ambulatory Visit (INDEPENDENT_AMBULATORY_CARE_PROVIDER_SITE_OTHER): Payer: BC Managed Care – PPO

## 2020-12-18 DIAGNOSIS — S32012A Unstable burst fracture of first lumbar vertebra, initial encounter for closed fracture: Secondary | ICD-10-CM | POA: Diagnosis not present

## 2020-12-18 DIAGNOSIS — M545 Low back pain, unspecified: Secondary | ICD-10-CM | POA: Diagnosis not present

## 2020-12-18 DIAGNOSIS — S32012S Unstable burst fracture of first lumbar vertebra, sequela: Secondary | ICD-10-CM

## 2020-12-19 ENCOUNTER — Other Ambulatory Visit: Payer: Self-pay

## 2020-12-19 DIAGNOSIS — S32012S Unstable burst fracture of first lumbar vertebra, sequela: Secondary | ICD-10-CM | POA: Diagnosis not present

## 2020-12-19 DIAGNOSIS — M5030 Other cervical disc degeneration, unspecified cervical region: Secondary | ICD-10-CM | POA: Diagnosis not present

## 2020-12-19 DIAGNOSIS — S065X0D Traumatic subdural hemorrhage without loss of consciousness, subsequent encounter: Secondary | ICD-10-CM | POA: Diagnosis not present

## 2020-12-19 DIAGNOSIS — K449 Diaphragmatic hernia without obstruction or gangrene: Secondary | ICD-10-CM | POA: Diagnosis not present

## 2020-12-19 DIAGNOSIS — R911 Solitary pulmonary nodule: Secondary | ICD-10-CM | POA: Diagnosis not present

## 2020-12-19 DIAGNOSIS — K592 Neurogenic bowel, not elsewhere classified: Secondary | ICD-10-CM | POA: Diagnosis not present

## 2020-12-19 DIAGNOSIS — F32A Depression, unspecified: Secondary | ICD-10-CM | POA: Diagnosis not present

## 2020-12-19 DIAGNOSIS — K5730 Diverticulosis of large intestine without perforation or abscess without bleeding: Secondary | ICD-10-CM | POA: Diagnosis not present

## 2020-12-19 DIAGNOSIS — S0003XD Contusion of scalp, subsequent encounter: Secondary | ICD-10-CM | POA: Diagnosis not present

## 2020-12-19 DIAGNOSIS — M80062D Age-related osteoporosis with current pathological fracture, left lower leg, subsequent encounter for fracture with routine healing: Secondary | ICD-10-CM | POA: Diagnosis not present

## 2020-12-19 DIAGNOSIS — G8222 Paraplegia, incomplete: Secondary | ICD-10-CM | POA: Diagnosis not present

## 2020-12-19 DIAGNOSIS — N319 Neuromuscular dysfunction of bladder, unspecified: Secondary | ICD-10-CM | POA: Diagnosis not present

## 2020-12-19 DIAGNOSIS — E039 Hypothyroidism, unspecified: Secondary | ICD-10-CM | POA: Diagnosis not present

## 2020-12-19 MED ORDER — OXYCODONE-ACETAMINOPHEN 5-325 MG PO TABS: 1.0000 | ORAL_TABLET | Freq: Four times a day (QID) | ORAL | 0 refills | Status: DC | PRN

## 2020-12-19 NOTE — Telephone Encounter (Signed)
Spoke with patient regarding MRI results and she requested and refill of the oxycodone unless there was "something better". Prescriptions pended.

## 2020-12-23 ENCOUNTER — Telehealth: Payer: Self-pay

## 2020-12-23 NOTE — Telephone Encounter (Signed)
Patient called stating she needs a referral for Outpatient care. Per pt, she was informed by the surgical office that referral needs to be issued by her provider for home care. Referral pended.

## 2020-12-23 NOTE — Telephone Encounter (Signed)
Patient called again requesting for provider to hold off on the referral request. Per pt, the surgeon's office will be putting in the order for [facility].

## 2020-12-28 DIAGNOSIS — R531 Weakness: Secondary | ICD-10-CM | POA: Diagnosis not present

## 2020-12-28 DIAGNOSIS — R262 Difficulty in walking, not elsewhere classified: Secondary | ICD-10-CM | POA: Diagnosis not present

## 2020-12-28 DIAGNOSIS — M5451 Vertebrogenic low back pain: Secondary | ICD-10-CM | POA: Diagnosis not present

## 2020-12-29 DIAGNOSIS — S32001D Stable burst fracture of unspecified lumbar vertebra, subsequent encounter for fracture with routine healing: Secondary | ICD-10-CM | POA: Diagnosis not present

## 2021-01-01 ENCOUNTER — Other Ambulatory Visit: Payer: Self-pay | Admitting: Osteopathic Medicine

## 2021-01-02 DIAGNOSIS — M5451 Vertebrogenic low back pain: Secondary | ICD-10-CM | POA: Diagnosis not present

## 2021-01-02 DIAGNOSIS — R262 Difficulty in walking, not elsewhere classified: Secondary | ICD-10-CM | POA: Diagnosis not present

## 2021-01-02 DIAGNOSIS — R531 Weakness: Secondary | ICD-10-CM | POA: Diagnosis not present

## 2021-01-04 ENCOUNTER — Other Ambulatory Visit: Payer: Self-pay

## 2021-01-04 ENCOUNTER — Ambulatory Visit (INDEPENDENT_AMBULATORY_CARE_PROVIDER_SITE_OTHER): Payer: BC Managed Care – PPO | Admitting: Sports Medicine

## 2021-01-04 ENCOUNTER — Ambulatory Visit: Payer: BC Managed Care – PPO | Admitting: Sports Medicine

## 2021-01-04 ENCOUNTER — Encounter: Payer: Self-pay | Admitting: Adult Health

## 2021-01-04 DIAGNOSIS — R262 Difficulty in walking, not elsewhere classified: Secondary | ICD-10-CM | POA: Diagnosis not present

## 2021-01-04 DIAGNOSIS — S82832A Other fracture of upper and lower end of left fibula, initial encounter for closed fracture: Secondary | ICD-10-CM | POA: Diagnosis not present

## 2021-01-04 DIAGNOSIS — M5451 Vertebrogenic low back pain: Secondary | ICD-10-CM | POA: Diagnosis not present

## 2021-01-04 DIAGNOSIS — S32012S Unstable burst fracture of first lumbar vertebra, sequela: Secondary | ICD-10-CM

## 2021-01-04 DIAGNOSIS — R531 Weakness: Secondary | ICD-10-CM | POA: Diagnosis not present

## 2021-01-04 MED ORDER — AMITRIPTYLINE HCL 100 MG PO TABS: 100.0000 mg | ORAL_TABLET | Freq: Every day | ORAL | 3 refills | Status: DC

## 2021-01-04 NOTE — Assessment & Plan Note (Signed)
L1 burst fracture with spinal cord compression, we did obtain a new lumbar spine MRI, no obvious changes, she is currently on gabapentin 1200 mg 3 times daily without sufficient pain relief, she was having some achiness in her left calf. We added amitriptyline which seems to help her sleep and only provided minimal improvement in her radicular symptoms. I did fill out some of her FMLA paperwork today but I did inform her that most of this for the spinal cord injury needs to be done by her neurosurgeon. Increasing amitriptyline to 100 mg nightly. Return as needed.

## 2021-01-04 NOTE — Progress Notes (Signed)
    Procedures performed today:    None.  Independent interpretation of notes and tests performed by another provider:   None.  Brief History, Exam, Impression, and Recommendations:    L1 burst fracture with instrumentation and spinal cord compression L1 burst fracture with spinal cord compression, we did obtain a new lumbar spine MRI, no obvious changes, she is currently on gabapentin 1200 mg 3 times daily without sufficient pain relief, she was having some achiness in her left calf. We added amitriptyline which seems to help her sleep and only provided minimal improvement in her radicular symptoms. I did fill out some of her FMLA paperwork today but I did inform her that most of this for the spinal cord injury needs to be done by her neurosurgeon. Increasing amitriptyline to 100 mg nightly. Return as needed.  Closed avulsion fracture of distal fibula, left, initial encounter Now approximately 7 to 8 weeks post fibular avulsion fracture, doing well with formal physical therapy at [facility]. Continue ASO for at least another 2 months, I will keep her out of work until the end of July. She can come back as needed for the fibula.    ___________________________________________ Gwen Her. Dianah Field, M.D., ABFM., CAQSM. Primary Care and [place] Instructor of [place] [facility]

## 2021-01-04 NOTE — Assessment & Plan Note (Addendum)
Now approximately 7 to 8 weeks post fibular avulsion fracture, doing well with formal physical therapy at [facility]. Continue ASO for at least another 2 months, I will keep her out of work until the end of July. She can come back as needed for the fibula. FMLA paperwork filled out today.

## 2021-01-06 DIAGNOSIS — Z885 Allergy status to narcotic agent status: Secondary | ICD-10-CM | POA: Diagnosis not present

## 2021-01-06 DIAGNOSIS — Z881 Allergy status to other antibiotic agents status: Secondary | ICD-10-CM | POA: Diagnosis not present

## 2021-01-06 DIAGNOSIS — G8911 Acute pain due to trauma: Secondary | ICD-10-CM | POA: Diagnosis not present

## 2021-01-06 DIAGNOSIS — S01511A Laceration without foreign body of lip, initial encounter: Secondary | ICD-10-CM | POA: Diagnosis not present

## 2021-01-06 DIAGNOSIS — Y92009 Unspecified place in unspecified non-institutional (private) residence as the place of occurrence of the external cause: Secondary | ICD-10-CM | POA: Diagnosis not present

## 2021-01-06 DIAGNOSIS — W01198A Fall on same level from slipping, tripping and stumbling with subsequent striking against other object, initial encounter: Secondary | ICD-10-CM | POA: Diagnosis not present

## 2021-01-06 DIAGNOSIS — Z79899 Other long term (current) drug therapy: Secondary | ICD-10-CM | POA: Diagnosis not present

## 2021-01-06 DIAGNOSIS — Z9102 Food additives allergy status: Secondary | ICD-10-CM | POA: Diagnosis not present

## 2021-01-06 DIAGNOSIS — E039 Hypothyroidism, unspecified: Secondary | ICD-10-CM | POA: Diagnosis not present

## 2021-01-06 DIAGNOSIS — Z79890 Hormone replacement therapy (postmenopausal): Secondary | ICD-10-CM | POA: Diagnosis not present

## 2021-01-06 DIAGNOSIS — Z888 Allergy status to other drugs, medicaments and biological substances status: Secondary | ICD-10-CM | POA: Diagnosis not present

## 2021-01-09 DIAGNOSIS — R531 Weakness: Secondary | ICD-10-CM | POA: Diagnosis not present

## 2021-01-09 DIAGNOSIS — M5451 Vertebrogenic low back pain: Secondary | ICD-10-CM | POA: Diagnosis not present

## 2021-01-09 DIAGNOSIS — R262 Difficulty in walking, not elsewhere classified: Secondary | ICD-10-CM | POA: Diagnosis not present

## 2021-01-10 DIAGNOSIS — R339 Retention of urine, unspecified: Secondary | ICD-10-CM | POA: Diagnosis not present

## 2021-01-11 DIAGNOSIS — R531 Weakness: Secondary | ICD-10-CM | POA: Diagnosis not present

## 2021-01-11 DIAGNOSIS — M5451 Vertebrogenic low back pain: Secondary | ICD-10-CM | POA: Diagnosis not present

## 2021-01-11 DIAGNOSIS — R262 Difficulty in walking, not elsewhere classified: Secondary | ICD-10-CM | POA: Diagnosis not present

## 2021-01-12 ENCOUNTER — Encounter: Payer: Self-pay | Admitting: Osteopathic Medicine

## 2021-01-12 ENCOUNTER — Ambulatory Visit (INDEPENDENT_AMBULATORY_CARE_PROVIDER_SITE_OTHER): Payer: BC Managed Care – PPO | Admitting: Osteopathic Medicine

## 2021-01-12 ENCOUNTER — Other Ambulatory Visit: Payer: Self-pay

## 2021-01-12 VITALS — BP 124/85 | HR 103 | Temp 98.2°F | Wt 162.1 lb

## 2021-01-12 DIAGNOSIS — S01511D Laceration without foreign body of lip, subsequent encounter: Secondary | ICD-10-CM | POA: Diagnosis not present

## 2021-01-12 DIAGNOSIS — Z4802 Encounter for removal of sutures: Secondary | ICD-10-CM | POA: Diagnosis not present

## 2021-01-12 DIAGNOSIS — R2681 Unsteadiness on feet: Secondary | ICD-10-CM | POA: Diagnosis not present

## 2021-01-12 DIAGNOSIS — M8588 Other specified disorders of bone density and structure, other site: Secondary | ICD-10-CM

## 2021-01-12 MED ORDER — OXYCODONE-ACETAMINOPHEN 5-325 MG PO TABS: 1.0000 | ORAL_TABLET | Freq: Four times a day (QID) | ORAL | 0 refills | Status: DC | PRN

## 2021-01-12 NOTE — Progress Notes (Signed)
Nancy Beard is a 65 y.o. female who presents to  [facility]  today, 01/12/21, seeking care for the following:  Fall and lip laceration - here today for suture removal. Showed me a video of her recent fall - was not wearing ankle brace, she fell when she tried to walk onto her L ankle which was inverted. She reports significant pain, oxycodone is helping but wears off. Would like to increase dose.  Healing laceration to upper lip - 2 sutures removed without difficulty      ASSESSMENT & PLAN with other pertinent findings:  The primary encounter diagnosis was Lip laceration, subsequent encounter. Diagnoses of Unsteady gait and Osteopenia of lumbar spine were also pertinent to this visit.    There are no Patient Instructions on file for this visit.  No orders of the defined types were placed in this encounter.   Meds ordered this encounter  Medications   oxyCODONE-acetaminophen (PERCOCET/ROXICET) 5-325 MG tablet    Sig: Take 1-2 tablets by mouth every 6 (six) hours as needed for severe pain. 60 tablets to last 30 days    Dispense:  60 tablet    Refill:  0     See below for relevant physical exam findings  See below for recent lab and imaging results reviewed  Medications, allergies, PMH, PSH, SocH, FamH reviewed below    Follow-up instructions: Return in about 3 months (around 04/07/2021) for MAINTAIN PAIN RX - SEE ME SOONER IF NEEDED .                                        Exam:  BP 124/85 (BP Location: Right Arm, Patient Position: Sitting, Cuff Size: Normal)   Pulse (!) 103   Temp 98.2 F (36.8 C) (Oral)   Wt 162 lb 1.3 oz (73.5 kg)   LMP 09/11/2012   BMI 26.16 kg/m  Constitutional: VS see above. General Appearance: alert, well-developed, well-nourished, NAD Neck: No masses, trachea midline.  Respiratory: Normal respiratory effort. Cardiovascular: S1/S2 normal, no  murmur, no rub/gallop auscultated. RRR.  Abdominal: non-tender, non-distended, no appreciable organomegaly, neg Murphy's, BS [id number] Neurological: Normal balance/coordination. No tremor. Skin: warm, dry, intact. Healing laceration to upper lip - 2 sutures removed without difficulty  Psychiatric: Normal judgment/insight. Normal mood and affect. Oriented x3.   Current Meds  Medication Sig   acetaminophen (TYLENOL) 325 MG tablet Take by mouth.   AMBULATORY NON FORMULARY MEDICATION BLOOD PRESSURE MONITOR - ARM CUFF PER PATIENT PREFERENCE / INSURANCE COVERAGE, DX: ELEVATED BLOOD PRESSURE   amitriptyline (ELAVIL) 100 MG tablet Take 1 tablet (100 mg total) by mouth at bedtime.   baclofen (LIORESAL) 10 MG tablet Take 5 mg by mouth 2 (two) times daily as needed.   Cholecalciferol (VITAMIN D-3 PO) Take 1 tablet by mouth daily.   gabapentin (NEURONTIN) 600 MG tablet Two tabs PO TID   levothyroxine (SYNTHROID) 112 MCG tablet TAKE 1 TABLET BY MOUTH DAILY BEFORE BREAKFAST.   rivaroxaban (XARELTO) 20 MG TABS tablet Take 1 tablet (20 mg total) by mouth daily with supper. start 11/10/20, ok to fill today 10/20/20.   triazolam (HALCION) 0.25 MG tablet 1-2 tabs PO 2 hours before procedure or imaging.  Do not drive with this medication.   valACYclovir (VALTREX) 1000 MG tablet Take 2 tablets by mouth 2 (two) times daily as needed (OUTBREAK).  venlafaxine XR (EFFEXOR-XR) 150 MG 24 hr capsule TAKE 1 CAPSULE (150 MG TOTAL) BY MOUTH DAILY WITH BREAKFAST.   [DISCONTINUED] oxyCODONE-acetaminophen (PERCOCET/ROXICET) 5-325 MG tablet Take 1-2 tablets by mouth every 6 (six) hours as needed for severe pain. 30 tablets to last 30 days    Allergies  Allergen Reactions   Ciprofloxacin Hives, Itching and Other (See Comments)    Patient states she had muscle spasms    Hydromorphone Hcl Itching   Nitrofurantoin Hives, Rash and Other (See Comments)    Significant transaminitis    Gluten Meal Other (See Comments)     Patient Active Problem List   Diagnosis Date Noted   Closed avulsion fracture of distal fibula, left, initial encounter 11/18/2020   L1 burst fracture with instrumentation and spinal cord compression 11/18/2020   Acute deep vein thrombosis (DVT) of proximal vein of right lower extremity ([place]) see [place] 10/20/20- Xarelto started 10/20/20 10/20/2020   Breast asymmetry following reconstructive surgery 05/03/2020   Screening for HIV (human immunodeficiency virus) 04/28/2020   Need for vaccination with 13-polyvalent pneumococcal conjugate vaccine 04/28/2020   Need for influenza vaccination 04/28/2020   Screening for colon cancer 04/28/2020   Osteopenia with pathologic fracture 04/28/2020   Elevated liver enzymes 11/12/2018   Prediabetes 11/12/2018   History of high cholesterol 11/12/2018   Herpes simplex 04/12/2017   Abnormal transaminases 06/27/2015   Iron deficiency 06/24/2015   Vitamin D deficiency 06/20/2015   Genital herpes 09/06/2014   Neuropathy 09/06/2014   Anticoagulant long-term use 05/01/2014   Persistent headaches 02/22/2014   Hot flashes related to aromatase inhibitor therapy 12/10/2013   Vulvar intraepithelial neoplasia III (VIN III) 06/30/2013   Status post bilateral breast reconstruction 06/30/2013   Osteoporosis 05/13/2013   Acquired absence of bilateral breasts and nipples 09/23/2012   Primary cancer of lower-inner quadrant of right female breast ([place]) 06/24/2012   Hyperlipidemia 05/13/2008   Hypothyroidism 01/31/2007   Depression 01/31/2007   Celiac disease/sprue 01/31/2007    Family History  Problem Relation Age of Onset   Breast cancer Paternal Aunt        dx in her 65s   Lymphoma Paternal Uncle    Breast cancer Paternal Grandmother        dx >50   Heart attack Paternal Grandfather    Breast cancer Other        2 maternal great aunts with breast cancer >50   Osteoporosis Mother        controlled with diet/exercise   COPD Father    Colon cancer Neg Hx     Colon polyps Neg Hx    Esophageal cancer Neg Hx    Rectal cancer Neg Hx    Stomach cancer Neg Hx     Social History   Tobacco Use  Smoking Status Never  Smokeless Tobacco Never    Past Surgical History:  Procedure Laterality Date   AXILLARY SENTINEL NODE BIOPSY Right 09/11/2012   Procedure: AXILLARY SENTINEL lymph NODE  BIOPSY;  Surgeon: Odis Hollingshead, MD;  Location: [place];  Service: General;  Laterality: Right;  right nuclear medicine injection 12:30    BREAST LUMPECTOMY     benign lump   BREAST RECONSTRUCTION Right 09/24/2013   Procedure: REVISION OF RIGHT BREAST RECONSTRUCTION WITH REPOSITIONING RIGHT IMPLANT, POSSIBLE EXCISION CAPSULAR CONTRACTURE AND LIPOFILLING FOR FAT GRAFTING;  Surgeon: Theodoro Kos, DO;  Location: [place];  Service: Plastics;  Laterality: Right;   BREAST RECONSTRUCTION WITH PLACEMENT OF TISSUE EXPANDER AND FLEX  HD (ACELLULAR HYDRATED DERMIS) Bilateral 09/11/2012   Procedure: BREAST RECONSTRUCTION WITH PLACEMENT OF TISSUE EXPANDER AND FLEX HD (ACELLULAR HYDRATED DERMIS) ADM;  Surgeon: Theodoro Kos, DO;  Location: [facility];  Service: Plastics;  Laterality: Bilateral;   COLONOSCOPY  12/20/2004   Carlean Purl   cryoblation of cervix     long time ago   ESOPHAGOGASTRODUODENOSCOPY  2007   sprue   EXTERNAL FIXATION LEG Left 06/15/2015   Procedure: EXTERNAL FIXATION LEG;  Surgeon: Altamese McElhattan, MD;  Location: [place];  Service: Orthopedics;  Laterality: Left;   INCISION AND DRAINAGE OF WOUND Left 09/16/2012   Procedure: Left Breast Evacuation of Hematoma;  Surgeon: Theodoro Kos, DO;  Location: [facility];  Service: Plastics;  Laterality: Left;   LIPOSUCTION WITH LIPOFILLING Bilateral 09/24/2013   Procedure: LIPOSUCTION WITH LIPOFILLING;  Surgeon: Theodoro Kos, DO;  Location: [facility];  Service: Plastics;  Laterality: Bilateral;  Biltalteal filling breast   LIPOSUCTION WITH LIPOFILLING Bilateral 06/08/2020   Procedure: LIPOSUCTION WITH  LIPOFILLING;  Surgeon: Wallace Going, DO;  Location: [place];  Service: Plastics;  Laterality: Bilateral;  90 min, please   ORIF TIBIA PLATEAU Left 06/21/2015   Procedure: OPEN REDUCTION INTERNAL FIXATION (ORIF) TIBIAL PLATEAU REMOVAL OF EXTERNAL FISTULA;  Surgeon: Altamese Hatch, MD;  Location: [place];  Service: Orthopedics;  Laterality: Left;   PORT-A-CATH REMOVAL Right 08/07/2013   Procedure: REMOVAL PORT-A-CATH;  Surgeon: Odis Hollingshead, MD;  Location: [place];  Service: General;  Laterality: Right;   PORTACATH PLACEMENT Right 10/09/2012   Procedure: US GUIDED INSERTION PORT-A-CATH;  Surgeon: Odis Hollingshead, MD;  Location: [place];  Service: General;  Laterality: Right;  Right Subclavian Vein   REMOVAL OF BILATERAL TISSUE EXPANDERS WITH PLACEMENT OF BILATERAL BREAST IMPLANTS Bilateral 06/24/2013   Procedure: REMOVAL OF BILATERAL TISSUE EXPANDERS WITH PLACEMENT OF BILATERAL BREAST IMPLANTS;  Surgeon: Theodoro Kos, DO;  Location: [place];  Service: Plastics;  Laterality: Bilateral;   TOTAL MASTECTOMY Bilateral 09/11/2012   Procedure: bilateral MASTECTOMY;  Surgeon: Odis Hollingshead, MD;  Location: [place];  Service: General;  Laterality: Bilateral;   VULVECTOMY N/A 07/14/2013   Procedure: WIDE LOCAL  EXCISION VULVAR;  Surgeon: Alvino Chapel, MD;  Location: [facility];  Service: Gynecology;  Laterality: N/A;    Immunization History  Administered Date(s) Administered   Fluad Quad(high Dose 65+) 04/28/2020   Influenza,inj,Quad PF,6+ Mos 04/12/2017, 05/14/2018   Influenza-Unspecified 05/30/2014, 06/01/2015, 04/15/2016   Janssen (J&J) SARS-COV-2 Vaccination 10/24/2019   Moderna Sars-Covid-2 Vaccination 05/13/2020   Pneumococcal Conjugate-13 04/28/2020   Tdap 09/06/2014   Zoster Recombinat (Shingrix) 05/10/2017, 11/08/2017    Recent Results (from the past 2160 hour(s))  DRUG MONITOR, PANEL 5, SCREEN, URINE      Status: Abnormal   Collection Time: 10/17/20 12:06 PM  Result Value Ref Range   Amphetamines NEGATIVE <500 ng/mL    Comment: See Note A   Barbiturates NEGATIVE <300 ng/mL    Comment: See Note A   Benzodiazepines NEGATIVE <100 ng/mL    Comment: See Note A   Cocaine Metabolite NEGATIVE <150 ng/mL    Comment: See Note A   Marijuana Metabolite POSITIVE (A) <20 ng/mL    Comment: See Note A   Methadone Metabolite NEGATIVE <100 ng/mL    Comment: See Note A   Opiates NEGATIVE <100 ng/mL    Comment: See Note A   Oxycodone POSITIVE (A) <100 ng/mL    Comment: See Note  A   Creatinine 49.8 mg/dL   pH 6.5 4.5 - 9.0   Oxidant NEGATIVE mcg/mL  DM TEMPLATE     Status: None   Collection Time: 10/17/20 12:06 PM  Result Value Ref Range   Notes and Comments      Comment: This drug testing is for medical treatment only. Analysis was performed as non-forensic testing and these results should be used only by healthcare providers to render diagnosis or treatment, or to monitor progress of medical conditions. . Note A: The results are presumptive; based only on screening methods, and they have not been confirmed by a definitive method. . . Healthcare Providers needing Interpretation assistance,  please contact us at [phone] ([phone])  M-F, 8am to 10pm EST     No results found.     All questions at time of visit were answered - patient instructed to contact office with any additional concerns or updates. ER/RTC precautions were reviewed with the patient as applicable.   Please note: manual typing as well as voice recognition software may have been used to produce this document - typos may escape review. Please contact Dr. Sheppard Coil for any needed clarifications.   Total encounter time on date of service, 01/12/21, was 30 minutes spent addressing problems/issues as noted above in [place], including time spent in discussion with patient regarding the HPI, ROS,  confirming history, reviewing Assessment & Plan, as well as time spent on coordination of care, record review.

## 2021-01-18 DIAGNOSIS — M5451 Vertebrogenic low back pain: Secondary | ICD-10-CM | POA: Diagnosis not present

## 2021-01-18 DIAGNOSIS — R262 Difficulty in walking, not elsewhere classified: Secondary | ICD-10-CM | POA: Diagnosis not present

## 2021-01-18 DIAGNOSIS — R531 Weakness: Secondary | ICD-10-CM | POA: Diagnosis not present

## 2021-01-20 DIAGNOSIS — M5451 Vertebrogenic low back pain: Secondary | ICD-10-CM | POA: Diagnosis not present

## 2021-01-20 DIAGNOSIS — R531 Weakness: Secondary | ICD-10-CM | POA: Diagnosis not present

## 2021-01-20 DIAGNOSIS — R262 Difficulty in walking, not elsewhere classified: Secondary | ICD-10-CM | POA: Diagnosis not present

## 2021-01-23 ENCOUNTER — Telehealth: Payer: Self-pay | Admitting: *Deleted

## 2021-01-23 DIAGNOSIS — M5451 Vertebrogenic low back pain: Secondary | ICD-10-CM | POA: Diagnosis not present

## 2021-01-23 DIAGNOSIS — R531 Weakness: Secondary | ICD-10-CM | POA: Diagnosis not present

## 2021-01-23 DIAGNOSIS — R262 Difficulty in walking, not elsewhere classified: Secondary | ICD-10-CM | POA: Diagnosis not present

## 2021-01-23 NOTE — Telephone Encounter (Signed)
Pt left vm c/o red spots on her lower legs bilaterally.  She stated "google says it could be from the Xarelto". Would you like for her to schedule an appointment for this?

## 2021-01-24 NOTE — Telephone Encounter (Signed)
MyChart message sent: Xarelto can cause increased bruising.  Tentative diagnosis/further evaluation, please call our clinic to schedule a visit

## 2021-01-25 DIAGNOSIS — M5451 Vertebrogenic low back pain: Secondary | ICD-10-CM | POA: Diagnosis not present

## 2021-01-25 DIAGNOSIS — R262 Difficulty in walking, not elsewhere classified: Secondary | ICD-10-CM | POA: Diagnosis not present

## 2021-01-25 DIAGNOSIS — R531 Weakness: Secondary | ICD-10-CM | POA: Diagnosis not present

## 2021-01-28 DIAGNOSIS — S32001D Stable burst fracture of unspecified lumbar vertebra, subsequent encounter for fracture with routine healing: Secondary | ICD-10-CM | POA: Diagnosis not present

## 2021-01-31 DIAGNOSIS — M5451 Vertebrogenic low back pain: Secondary | ICD-10-CM | POA: Diagnosis not present

## 2021-01-31 DIAGNOSIS — R262 Difficulty in walking, not elsewhere classified: Secondary | ICD-10-CM | POA: Diagnosis not present

## 2021-01-31 DIAGNOSIS — M62838 Other muscle spasm: Secondary | ICD-10-CM | POA: Diagnosis not present

## 2021-01-31 DIAGNOSIS — K592 Neurogenic bowel, not elsewhere classified: Secondary | ICD-10-CM | POA: Diagnosis not present

## 2021-01-31 DIAGNOSIS — R531 Weakness: Secondary | ICD-10-CM | POA: Diagnosis not present

## 2021-01-31 DIAGNOSIS — N319 Neuromuscular dysfunction of bladder, unspecified: Secondary | ICD-10-CM | POA: Diagnosis not present

## 2021-02-02 DIAGNOSIS — R262 Difficulty in walking, not elsewhere classified: Secondary | ICD-10-CM | POA: Diagnosis not present

## 2021-02-02 DIAGNOSIS — M5451 Vertebrogenic low back pain: Secondary | ICD-10-CM | POA: Diagnosis not present

## 2021-02-02 DIAGNOSIS — R531 Weakness: Secondary | ICD-10-CM | POA: Diagnosis not present

## 2021-02-07 DIAGNOSIS — R531 Weakness: Secondary | ICD-10-CM | POA: Diagnosis not present

## 2021-02-07 DIAGNOSIS — R339 Retention of urine, unspecified: Secondary | ICD-10-CM | POA: Diagnosis not present

## 2021-02-07 DIAGNOSIS — R262 Difficulty in walking, not elsewhere classified: Secondary | ICD-10-CM | POA: Diagnosis not present

## 2021-02-07 DIAGNOSIS — M5451 Vertebrogenic low back pain: Secondary | ICD-10-CM | POA: Diagnosis not present

## 2021-02-09 DIAGNOSIS — R262 Difficulty in walking, not elsewhere classified: Secondary | ICD-10-CM | POA: Diagnosis not present

## 2021-02-09 DIAGNOSIS — M5451 Vertebrogenic low back pain: Secondary | ICD-10-CM | POA: Diagnosis not present

## 2021-02-09 DIAGNOSIS — R531 Weakness: Secondary | ICD-10-CM | POA: Diagnosis not present

## 2021-02-15 DIAGNOSIS — R531 Weakness: Secondary | ICD-10-CM | POA: Diagnosis not present

## 2021-02-15 DIAGNOSIS — M5451 Vertebrogenic low back pain: Secondary | ICD-10-CM | POA: Diagnosis not present

## 2021-02-15 DIAGNOSIS — R262 Difficulty in walking, not elsewhere classified: Secondary | ICD-10-CM | POA: Diagnosis not present

## 2021-02-17 DIAGNOSIS — R531 Weakness: Secondary | ICD-10-CM | POA: Diagnosis not present

## 2021-02-17 DIAGNOSIS — R262 Difficulty in walking, not elsewhere classified: Secondary | ICD-10-CM | POA: Diagnosis not present

## 2021-02-17 DIAGNOSIS — M5451 Vertebrogenic low back pain: Secondary | ICD-10-CM | POA: Diagnosis not present

## 2021-02-20 DIAGNOSIS — R262 Difficulty in walking, not elsewhere classified: Secondary | ICD-10-CM | POA: Diagnosis not present

## 2021-02-20 DIAGNOSIS — M5451 Vertebrogenic low back pain: Secondary | ICD-10-CM | POA: Diagnosis not present

## 2021-02-20 DIAGNOSIS — R531 Weakness: Secondary | ICD-10-CM | POA: Diagnosis not present

## 2021-02-23 ENCOUNTER — Telehealth: Payer: Self-pay

## 2021-02-23 ENCOUNTER — Other Ambulatory Visit: Payer: Self-pay

## 2021-02-23 DIAGNOSIS — R531 Weakness: Secondary | ICD-10-CM | POA: Diagnosis not present

## 2021-02-23 DIAGNOSIS — R262 Difficulty in walking, not elsewhere classified: Secondary | ICD-10-CM | POA: Diagnosis not present

## 2021-02-23 DIAGNOSIS — M5451 Vertebrogenic low back pain: Secondary | ICD-10-CM | POA: Diagnosis not present

## 2021-02-23 MED ORDER — LEVOTHYROXINE SODIUM 112 MCG PO TABS: 112.0000 ug | ORAL_TABLET | Freq: Every day | ORAL | 0 refills | Status: DC

## 2021-02-23 NOTE — Telephone Encounter (Signed)
Pt left a vm msg requesting a med refill for hydrocodone. Rx not listed in active med list.

## 2021-02-24 MED ORDER — OXYCODONE-ACETAMINOPHEN 5-325 MG PO TABS: 1.0000 | ORAL_TABLET | Freq: Four times a day (QID) | ORAL | 0 refills | Status: DC | PRN

## 2021-02-24 NOTE — Telephone Encounter (Signed)
PDMP reviewed, has been greater than 30 days since her last fill, went ahead and refilled medication

## 2021-02-27 DIAGNOSIS — R531 Weakness: Secondary | ICD-10-CM | POA: Diagnosis not present

## 2021-02-27 DIAGNOSIS — M5451 Vertebrogenic low back pain: Secondary | ICD-10-CM | POA: Diagnosis not present

## 2021-02-27 DIAGNOSIS — R262 Difficulty in walking, not elsewhere classified: Secondary | ICD-10-CM | POA: Diagnosis not present

## 2021-02-28 DIAGNOSIS — S32001D Stable burst fracture of unspecified lumbar vertebra, subsequent encounter for fracture with routine healing: Secondary | ICD-10-CM | POA: Diagnosis not present

## 2021-03-01 DIAGNOSIS — R531 Weakness: Secondary | ICD-10-CM | POA: Diagnosis not present

## 2021-03-01 DIAGNOSIS — R262 Difficulty in walking, not elsewhere classified: Secondary | ICD-10-CM | POA: Diagnosis not present

## 2021-03-01 DIAGNOSIS — M5451 Vertebrogenic low back pain: Secondary | ICD-10-CM | POA: Diagnosis not present

## 2021-03-06 DIAGNOSIS — M5451 Vertebrogenic low back pain: Secondary | ICD-10-CM | POA: Diagnosis not present

## 2021-03-06 DIAGNOSIS — R262 Difficulty in walking, not elsewhere classified: Secondary | ICD-10-CM | POA: Diagnosis not present

## 2021-03-06 DIAGNOSIS — N390 Urinary tract infection, site not specified: Secondary | ICD-10-CM | POA: Diagnosis not present

## 2021-03-06 DIAGNOSIS — T83511A Infection and inflammatory reaction due to indwelling urethral catheter, initial encounter: Secondary | ICD-10-CM | POA: Diagnosis not present

## 2021-03-06 DIAGNOSIS — R531 Weakness: Secondary | ICD-10-CM | POA: Diagnosis not present

## 2021-03-06 DIAGNOSIS — R399 Unspecified symptoms and signs involving the genitourinary system: Secondary | ICD-10-CM | POA: Diagnosis not present

## 2021-03-08 DIAGNOSIS — R262 Difficulty in walking, not elsewhere classified: Secondary | ICD-10-CM | POA: Diagnosis not present

## 2021-03-08 DIAGNOSIS — M5451 Vertebrogenic low back pain: Secondary | ICD-10-CM | POA: Diagnosis not present

## 2021-03-08 DIAGNOSIS — R531 Weakness: Secondary | ICD-10-CM | POA: Diagnosis not present

## 2021-03-10 DIAGNOSIS — Z981 Arthrodesis status: Secondary | ICD-10-CM | POA: Diagnosis not present

## 2021-03-10 DIAGNOSIS — S32019D Unspecified fracture of first lumbar vertebra, subsequent encounter for fracture with routine healing: Secondary | ICD-10-CM | POA: Diagnosis not present

## 2021-03-10 DIAGNOSIS — Z4789 Encounter for other orthopedic aftercare: Secondary | ICD-10-CM | POA: Diagnosis not present

## 2021-03-11 ENCOUNTER — Encounter: Payer: Self-pay | Admitting: Family Medicine

## 2021-03-11 ENCOUNTER — Other Ambulatory Visit: Payer: Self-pay

## 2021-03-11 ENCOUNTER — Emergency Department
Admission: EM | Admit: 2021-03-11 | Discharge: 2021-03-11 | Disposition: A | Discharge status: Home / Self Care | Payer: BC Managed Care – PPO | Source: Home / Self Care

## 2021-03-11 DIAGNOSIS — R339 Retention of urine, unspecified: Secondary | ICD-10-CM

## 2021-03-11 DIAGNOSIS — N3001 Acute cystitis with hematuria: Secondary | ICD-10-CM

## 2021-03-11 LAB — POCT URINALYSIS DIP (MANUAL ENTRY)
Bilirubin, UA: NEGATIVE
Glucose, UA: NEGATIVE mg/dL
Ketones, POC UA: NEGATIVE mg/dL
Nitrite, UA: POSITIVE — AB
Protein Ur, POC: 30 mg/dL — AB
Spec Grav, UA: 1.015 (ref 1.010–1.025)
Urobilinogen, UA: 0.2 E.U./dL
pH, UA: 6 (ref 5.0–8.0)

## 2021-03-11 MED ORDER — SULFAMETHOXAZOLE-TRIMETHOPRIM 800-160 MG PO TABS: 1.0000 | ORAL_TABLET | Freq: Two times a day (BID) | ORAL | 0 refills | Status: AC

## 2021-03-11 NOTE — ED Provider Notes (Signed)
Nancy Beard CARE    CSN: [id number] Arrival date & time: 03/11/21  [id number]      History   Chief Complaint Chief Complaint  Patient presents with   Urinary Tract Infection    HPI Nancy Beard is a 65 y.o. female.   HPI 65 year old female presents  with dysuria/urinary frequency patient was evaluated by her urologist on 03/06/2021 for UTI following in/out catheter procedure d/t urinary retention.  Patient fractured vertebrae of spine in February of this year and will have intermittent symptoms of neurogenic bladder in which she cannot feel urinary pressure urgency.  Reports urinary retention, urinary frequency has been going on for 2 weeks.  Urology note from 03/06/2021 reveals positive UA; however, unable to obtain urine culture results from this office encounter.  Patient has been unable to void after 2 attempts this morning.  Past Medical History:  Diagnosis Date   Anxiety    Blood transfusion without reported diagnosis 2014   Breast cancer ([place]) 2014   right, bil mast   Celiac disease    Depression    DIVERTICULOSIS, COLON 05/05/2007   DVT (deep venous thrombosis) ([place]) 2014   Right lower extremity, thigh   DVT (deep venous thrombosis) ([place]) 06/30/2013   August 2014. Continued Xarelto due to persistent chronic DVT R popliteal vein. Plan repeat per oncology March 2016    Fracture of left tibial plateau 06/15/2015   Fracture tibia/fibula 06/14/2015   HEMORRHOIDS, EXTERNAL 05/05/2007   History of chemotherapy    Hypothyroidism    Neuropathy    of fingers and toes   Pneumonia    hx of years ago   Thyroid disease    Vitamin D insufficiency 06/20/2015   Wears glasses     Patient Active Problem List   Diagnosis Date Noted   Closed avulsion fracture of distal fibula, left, initial encounter 11/18/2020   L1 burst fracture with instrumentation and spinal cord compression 11/18/2020   Acute deep vein thrombosis (DVT) of proximal vein of right lower extremity ([place])  see [place] 10/20/20- Xarelto started 10/20/20 10/20/2020   Breast asymmetry following reconstructive surgery 05/03/2020   Screening for HIV (human immunodeficiency virus) 04/28/2020   Need for vaccination with 13-polyvalent pneumococcal conjugate vaccine 04/28/2020   Need for influenza vaccination 04/28/2020   Screening for colon cancer 04/28/2020   Osteopenia with pathologic fracture 04/28/2020   Elevated liver enzymes 11/12/2018   Prediabetes 11/12/2018   History of high cholesterol 11/12/2018   Herpes simplex 04/12/2017   Abnormal transaminases 06/27/2015   Iron deficiency 06/24/2015   Vitamin D deficiency 06/20/2015   Genital herpes 09/06/2014   Neuropathy 09/06/2014   Anticoagulant long-term use 05/01/2014   Persistent headaches 02/22/2014   Hot flashes related to aromatase inhibitor therapy 12/10/2013   Vulvar intraepithelial neoplasia III (VIN III) 06/30/2013   Status post bilateral breast reconstruction 06/30/2013   Osteoporosis 05/13/2013   Acquired absence of bilateral breasts and nipples 09/23/2012   Primary cancer of lower-inner quadrant of right female breast ([place]) 06/24/2012   Hyperlipidemia 05/13/2008   Hypothyroidism 01/31/2007   Depression 01/31/2007   Celiac disease/sprue 01/31/2007    Past Surgical History:  Procedure Laterality Date   AXILLARY SENTINEL NODE BIOPSY Right 09/11/2012   Procedure: AXILLARY SENTINEL lymph NODE  BIOPSY;  Surgeon: Odis Hollingshead, MD;  Location: [facility];  Service: General;  Laterality: Right;  right nuclear medicine injection 12:30    BREAST LUMPECTOMY     benign lump   BREAST RECONSTRUCTION Right 09/24/2013  Procedure: REVISION OF RIGHT BREAST RECONSTRUCTION WITH REPOSITIONING RIGHT IMPLANT, POSSIBLE EXCISION CAPSULAR CONTRACTURE AND LIPOFILLING FOR FAT GRAFTING;  Surgeon: Theodoro Kos, DO;  Location: [facility];  Service: Plastics;  Laterality: Right;   BREAST RECONSTRUCTION WITH PLACEMENT OF TISSUE EXPANDER AND FLEX HD  (ACELLULAR HYDRATED DERMIS) Bilateral 09/11/2012   Procedure: BREAST RECONSTRUCTION WITH PLACEMENT OF TISSUE EXPANDER AND FLEX HD (ACELLULAR HYDRATED DERMIS) ADM;  Surgeon: Theodoro Kos, DO;  Location: [facility];  Service: Plastics;  Laterality: Bilateral;   COLONOSCOPY  12/20/2004   Carlean Purl   cryoblation of cervix     long time ago   ESOPHAGOGASTRODUODENOSCOPY  2007   sprue   EXTERNAL FIXATION LEG Left 06/15/2015   Procedure: EXTERNAL FIXATION LEG;  Surgeon: Altamese Avella, MD;  Location: [facility];  Service: Orthopedics;  Laterality: Left;   INCISION AND DRAINAGE OF WOUND Left 09/16/2012   Procedure: Left Breast Evacuation of Hematoma;  Surgeon: Theodoro Kos, DO;  Location: [facility];  Service: Plastics;  Laterality: Left;   LIPOSUCTION WITH LIPOFILLING Bilateral 09/24/2013   Procedure: LIPOSUCTION WITH LIPOFILLING;  Surgeon: Theodoro Kos, DO;  Location: [facility];  Service: Plastics;  Laterality: Bilateral;  Biltalteal filling breast   LIPOSUCTION WITH LIPOFILLING Bilateral 06/08/2020   Procedure: LIPOSUCTION WITH LIPOFILLING;  Surgeon: Wallace Going, DO;  Location: [facility];  Service: Plastics;  Laterality: Bilateral;  90 min, please   ORIF TIBIA PLATEAU Left 06/21/2015   Procedure: OPEN REDUCTION INTERNAL FIXATION (ORIF) TIBIAL PLATEAU REMOVAL OF EXTERNAL FISTULA;  Surgeon: Altamese Campton, MD;  Location: [facility];  Service: Orthopedics;  Laterality: Left;   PORT-A-CATH REMOVAL Right 08/07/2013   Procedure: REMOVAL PORT-A-CATH;  Surgeon: Odis Hollingshead, MD;  Location: [facility];  Service: General;  Laterality: Right;   PORTACATH PLACEMENT Right 10/09/2012   Procedure: US GUIDED INSERTION PORT-A-CATH;  Surgeon: Odis Hollingshead, MD;  Location: [facility];  Service: General;  Laterality: Right;  Right Subclavian Vein   REMOVAL OF BILATERAL TISSUE EXPANDERS WITH PLACEMENT OF BILATERAL BREAST IMPLANTS Bilateral 06/24/2013   Procedure:  REMOVAL OF BILATERAL TISSUE EXPANDERS WITH PLACEMENT OF BILATERAL BREAST IMPLANTS;  Surgeon: Theodoro Kos, DO;  Location: [facility];  Service: Plastics;  Laterality: Bilateral;   TOTAL MASTECTOMY Bilateral 09/11/2012   Procedure: bilateral MASTECTOMY;  Surgeon: Odis Hollingshead, MD;  Location: [facility];  Service: General;  Laterality: Bilateral;   VULVECTOMY N/A 07/14/2013   Procedure: WIDE LOCAL  EXCISION VULVAR;  Surgeon: Alvino Chapel, MD;  Location: [facility];  Service: Gynecology;  Laterality: N/A;    OB History   No obstetric history on file.      Home Medications    Prior to Admission medications   Medication Sig Start Date End Date Taking? Authorizing Provider  sulfamethoxazole-trimethoprim (BACTRIM DS) 800-160 MG tablet Take 1 tablet by mouth 2 (two) times daily for 10 days. 03/11/21 03/21/21 Yes Eliezer Lofts, FNP  acetaminophen (TYLENOL) 325 MG tablet Take by mouth. 09/28/20   [provider]  AMBULATORY NON FORMULARY MEDICATION BLOOD PRESSURE MONITOR - ARM CUFF PER PATIENT PREFERENCE / INSURANCE COVERAGE, DX: ELEVATED BLOOD PRESSURE 02/22/20   Emeterio Reeve, DO  amitriptyline (ELAVIL) 100 MG tablet Take 1 tablet (100 mg total) by mouth at bedtime. 01/04/21   Silverio Decamp, MD  baclofen (LIORESAL) 10 MG tablet Take 5 mg by mouth 2 (two) times daily as needed. 09/28/20   [provider]  Cholecalciferol (VITAMIN D-3  PO) Take 1 tablet by mouth daily.    [provider]  gabapentin (NEURONTIN) 600 MG tablet Two tabs PO TID 11/18/20   Silverio Decamp, MD  levothyroxine (SYNTHROID) 112 MCG tablet Take 1 tablet (112 mcg total) by mouth daily before breakfast. 02/23/21   Emeterio Reeve, DO  oxyCODONE-acetaminophen (PERCOCET/ROXICET) 5-325 MG tablet Take 1-2 tablets by mouth every 6 (six) hours as needed for severe pain. 60 tablets to last 30 days 02/24/21 03/26/21  Emeterio Reeve, DO  rivaroxaban (XARELTO) 20 MG TABS  tablet Take 1 tablet (20 mg total) by mouth daily with supper. start 11/10/20, ok to fill today 10/20/20. 12/14/20   Silverio Decamp, MD  triazolam (HALCION) 0.25 MG tablet 1-2 tabs PO 2 hours before procedure or imaging.  Do not drive with this medication. 12/14/20   Silverio Decamp, MD  valACYclovir (VALTREX) 1000 MG tablet Take 2 tablets by mouth 2 (two) times daily as needed (OUTBREAK).     [provider]  venlafaxine XR (EFFEXOR-XR) 150 MG 24 hr capsule TAKE 1 CAPSULE (150 MG TOTAL) BY MOUTH DAILY WITH BREAKFAST. 01/02/21   Emeterio Reeve, DO    Family History Family History  Problem Relation Age of Onset   Breast cancer Paternal Aunt        dx in her 19s   Lymphoma Paternal Uncle    Breast cancer Paternal Grandmother        dx >50   Heart attack Paternal Grandfather    Breast cancer Other        2 maternal great aunts with breast cancer >50   Osteoporosis Mother        controlled with diet/exercise   COPD Father    Colon cancer Neg Hx    Colon polyps Neg Hx    Esophageal cancer Neg Hx    Rectal cancer Neg Hx    Stomach cancer Neg Hx     Social History Social History   Tobacco Use   Smoking status: Never   Smokeless tobacco: Never  Vaping Use   Vaping Use: Never used  Substance Use Topics   Alcohol use: No   Drug use: No     Allergies   Ciprofloxacin, Hydromorphone hcl, Nitrofurantoin, and Gluten meal   Review of Systems Review of Systems  Genitourinary:  Positive for difficulty urinating and urgency.  All other systems reviewed and are negative.   Physical Exam Triage Vital Signs ED Triage Vitals  Enc Vitals Group     BP      Pulse      Resp      Temp      Temp src      SpO2      Weight      Height      Head Circumference      Peak Flow      Pain Score      Pain Loc      Pain Edu?      Excl. in [place]?    No data found.  Updated Vital Signs BP 107/75 (BP Location: Left Arm)   Pulse (!) 116   Temp 100 F (37.8 C)  (Oral)   Resp 18   LMP 09/11/2012   SpO2 97%      Physical Exam Constitutional:      General: She is not in acute distress.    Appearance: Normal appearance. She is normal weight. She is ill-appearing. She is not toxic-appearing or diaphoretic.  HENT:     Head: Normocephalic and atraumatic.     Mouth/Throat:     Mouth: Mucous membranes are moist.     Pharynx: Oropharynx is clear.  Eyes:     Extraocular Movements: Extraocular movements intact.     Conjunctiva/sclera: Conjunctivae normal.     Pupils: Pupils are equal, round, and reactive to light.  Cardiovascular:     Rate and Rhythm: Normal rate and regular rhythm.     Pulses: Normal pulses.     Heart sounds: Normal heart sounds. No murmur heard. Pulmonary:     Effort: Pulmonary effort is normal.     Breath sounds: Normal breath sounds.     Comments: No adventitious breath sounds noted Abdominal:     Tenderness: There is no right CVA tenderness or left CVA tenderness.  Musculoskeletal:        General: Normal range of motion.     Cervical back: Normal range of motion and neck supple. No tenderness.     Comments: Ambulating this morning with four-point rolling walker  Lymphadenopathy:     Cervical: No cervical adenopathy.  Skin:    General: Skin is warm and dry.  Neurological:     General: No focal deficit present.     Mental Status: She is alert and oriented to person, place, and time. Mental status is at baseline.  Psychiatric:        Mood and Affect: Mood normal.        Behavior: Behavior normal.        Thought Content: Thought content normal.     UC Treatments / Results  Labs (all labs ordered are listed, but only abnormal results are displayed) Labs Reviewed  POCT URINALYSIS DIP (MANUAL ENTRY) - Abnormal; Notable for the following components:      Result Value   Clarity, UA cloudy (*)    Blood, UA moderate (*)    Protein Ur, POC =30 (*)    Nitrite, UA Positive (*)    Leukocytes, UA Large (3+) (*)    All  other components within normal limits  URINE CULTURE    EKG   Radiology No results found.  Procedures Procedures (including critical care time)  Medications Ordered in UC Medications - No data to display  Initial Impression / Assessment and Plan / UC Course  I have reviewed the triage vital signs and the nursing notes.  Pertinent labs & imaging results that were available during my care of the patient were reviewed by me and considered in my medical decision making (see chart for details).     MDM: 1. Urinary retention-after 2 failed attempts to get UA patient had her husband bring in and out cath to our clinic for her to catheter self in order to get UA, and culture ordered; 2.  Acute cystitis with hematuria-Rx'd Bactrim. Advised/instructed patient to take medication as directed with food to completion.  Encourage patient to increase daily water intake while taking Bactrim (preferably 48 ounces per day / 7 days/week).  Advised patient we will follow-up with urine culture results once received.  Advised/instructed patient if unable to urinate/difficulty urinating please go to nearest ED for immediate evaluation.  Patient discharged home, hemodynamically stable. Final Clinical Impressions(s) / UC Diagnoses   Final diagnoses:  Acute cystitis with hematuria  Urinary retention     Discharge Instructions      Advised/instructed patient to take medication as directed with food to completion.  Encourage patient to increase daily water intake  while taking Bactrim (preferably 48 ounces per day / 7 days/week).  Advised patient we will follow-up with urine culture results once received.  Advised/instructed patient if unable to urinate/difficulty urinating please go to nearest ED for immediate evaluation.     ED Prescriptions     Medication Sig Dispense Auth. Provider   sulfamethoxazole-trimethoprim (BACTRIM DS) 800-160 MG tablet Take 1 tablet by mouth 2 (two) times daily for 10 days.  20 tablet Eliezer Lofts, FNP      PDMP not reviewed this encounter.   Eliezer Lofts, [place] 03/11/21 1000

## 2021-03-11 NOTE — Discharge Instructions (Addendum)
Advised/instructed patient to take medication as directed with food to completion.  Encourage patient to increase daily water intake while taking Bactrim (preferably 48 ounces per day / 7 days/week).  Advised patient we will follow-up with urine culture results once received.  Advised/instructed patient if unable to urinate/difficulty urinating please go to nearest ED for immediate evaluation.

## 2021-03-11 NOTE — ED Triage Notes (Signed)
Pt present lower back pain with cloudy urine and odor. Symptoms started two weeks ago.

## 2021-03-13 DIAGNOSIS — M5451 Vertebrogenic low back pain: Secondary | ICD-10-CM | POA: Diagnosis not present

## 2021-03-13 DIAGNOSIS — R262 Difficulty in walking, not elsewhere classified: Secondary | ICD-10-CM | POA: Diagnosis not present

## 2021-03-13 DIAGNOSIS — R531 Weakness: Secondary | ICD-10-CM | POA: Diagnosis not present

## 2021-03-14 LAB — URINE CULTURE
MICRO NUMBER:: 12301634
SPECIMEN QUALITY:: ADEQUATE

## 2021-03-15 ENCOUNTER — Telehealth (HOSPITAL_COMMUNITY): Payer: Self-pay | Admitting: Emergency Medicine

## 2021-03-15 DIAGNOSIS — R531 Weakness: Secondary | ICD-10-CM | POA: Diagnosis not present

## 2021-03-15 DIAGNOSIS — M5451 Vertebrogenic low back pain: Secondary | ICD-10-CM | POA: Diagnosis not present

## 2021-03-15 DIAGNOSIS — R262 Difficulty in walking, not elsewhere classified: Secondary | ICD-10-CM | POA: Diagnosis not present

## 2021-03-15 MED ORDER — AMOXICILLIN-POT CLAVULANATE 875-125 MG PO TABS: 1.0000 | ORAL_TABLET | Freq: Two times a day (BID) | ORAL | 0 refills | Status: AC

## 2021-03-22 ENCOUNTER — Other Ambulatory Visit: Payer: Self-pay | Admitting: Sports Medicine

## 2021-03-22 DIAGNOSIS — R262 Difficulty in walking, not elsewhere classified: Secondary | ICD-10-CM | POA: Diagnosis not present

## 2021-03-22 DIAGNOSIS — R531 Weakness: Secondary | ICD-10-CM | POA: Diagnosis not present

## 2021-03-22 DIAGNOSIS — R399 Unspecified symptoms and signs involving the genitourinary system: Secondary | ICD-10-CM | POA: Diagnosis not present

## 2021-03-22 DIAGNOSIS — M5451 Vertebrogenic low back pain: Secondary | ICD-10-CM | POA: Diagnosis not present

## 2021-03-22 DIAGNOSIS — S32012S Unstable burst fracture of first lumbar vertebra, sequela: Secondary | ICD-10-CM

## 2021-03-24 ENCOUNTER — Other Ambulatory Visit: Payer: Self-pay

## 2021-03-24 DIAGNOSIS — R531 Weakness: Secondary | ICD-10-CM | POA: Diagnosis not present

## 2021-03-24 DIAGNOSIS — R262 Difficulty in walking, not elsewhere classified: Secondary | ICD-10-CM | POA: Diagnosis not present

## 2021-03-24 DIAGNOSIS — M5451 Vertebrogenic low back pain: Secondary | ICD-10-CM | POA: Diagnosis not present

## 2021-03-27 DIAGNOSIS — R531 Weakness: Secondary | ICD-10-CM | POA: Diagnosis not present

## 2021-03-27 DIAGNOSIS — M5451 Vertebrogenic low back pain: Secondary | ICD-10-CM | POA: Diagnosis not present

## 2021-03-27 DIAGNOSIS — R262 Difficulty in walking, not elsewhere classified: Secondary | ICD-10-CM | POA: Diagnosis not present

## 2021-03-28 MED ORDER — OXYCODONE-ACETAMINOPHEN 5-325 MG PO TABS: 1.0000 | ORAL_TABLET | Freq: Four times a day (QID) | ORAL | 0 refills | Status: DC | PRN

## 2021-03-29 DIAGNOSIS — R262 Difficulty in walking, not elsewhere classified: Secondary | ICD-10-CM | POA: Diagnosis not present

## 2021-03-29 DIAGNOSIS — R531 Weakness: Secondary | ICD-10-CM | POA: Diagnosis not present

## 2021-03-29 DIAGNOSIS — M5451 Vertebrogenic low back pain: Secondary | ICD-10-CM | POA: Diagnosis not present

## 2021-04-03 DIAGNOSIS — M5451 Vertebrogenic low back pain: Secondary | ICD-10-CM | POA: Diagnosis not present

## 2021-04-03 DIAGNOSIS — R531 Weakness: Secondary | ICD-10-CM | POA: Diagnosis not present

## 2021-04-03 DIAGNOSIS — R262 Difficulty in walking, not elsewhere classified: Secondary | ICD-10-CM | POA: Diagnosis not present

## 2021-04-05 DIAGNOSIS — Z8781 Personal history of (healed) traumatic fracture: Secondary | ICD-10-CM | POA: Diagnosis not present

## 2021-04-05 DIAGNOSIS — K219 Gastro-esophageal reflux disease without esophagitis: Secondary | ICD-10-CM | POA: Diagnosis not present

## 2021-04-05 DIAGNOSIS — Z7409 Other reduced mobility: Secondary | ICD-10-CM | POA: Diagnosis not present

## 2021-04-05 DIAGNOSIS — R531 Weakness: Secondary | ICD-10-CM | POA: Diagnosis not present

## 2021-04-05 DIAGNOSIS — M810 Age-related osteoporosis without current pathological fracture: Secondary | ICD-10-CM | POA: Diagnosis not present

## 2021-04-05 DIAGNOSIS — E559 Vitamin D deficiency, unspecified: Secondary | ICD-10-CM | POA: Diagnosis not present

## 2021-04-05 DIAGNOSIS — K900 Celiac disease: Secondary | ICD-10-CM | POA: Diagnosis not present

## 2021-04-05 DIAGNOSIS — R262 Difficulty in walking, not elsewhere classified: Secondary | ICD-10-CM | POA: Diagnosis not present

## 2021-04-05 DIAGNOSIS — M5451 Vertebrogenic low back pain: Secondary | ICD-10-CM | POA: Diagnosis not present

## 2021-04-05 DIAGNOSIS — E039 Hypothyroidism, unspecified: Secondary | ICD-10-CM | POA: Diagnosis not present

## 2021-04-05 DIAGNOSIS — Z9181 History of falling: Secondary | ICD-10-CM | POA: Diagnosis not present

## 2021-04-07 ENCOUNTER — Ambulatory Visit: Payer: BC Managed Care – PPO | Admitting: Osteopathic Medicine

## 2021-04-12 DIAGNOSIS — R531 Weakness: Secondary | ICD-10-CM | POA: Diagnosis not present

## 2021-04-12 DIAGNOSIS — R262 Difficulty in walking, not elsewhere classified: Secondary | ICD-10-CM | POA: Diagnosis not present

## 2021-04-12 DIAGNOSIS — M5451 Vertebrogenic low back pain: Secondary | ICD-10-CM | POA: Diagnosis not present

## 2021-04-14 ENCOUNTER — Other Ambulatory Visit: Payer: Self-pay | Admitting: Sports Medicine

## 2021-04-14 DIAGNOSIS — S32012S Unstable burst fracture of first lumbar vertebra, sequela: Secondary | ICD-10-CM

## 2021-04-14 DIAGNOSIS — Z981 Arthrodesis status: Secondary | ICD-10-CM | POA: Diagnosis not present

## 2021-04-18 ENCOUNTER — Encounter: Payer: Self-pay | Admitting: Medical-Surgical

## 2021-04-18 ENCOUNTER — Ambulatory Visit (INDEPENDENT_AMBULATORY_CARE_PROVIDER_SITE_OTHER): Payer: BC Managed Care – PPO | Admitting: Medical-Surgical

## 2021-04-18 ENCOUNTER — Other Ambulatory Visit: Payer: Self-pay

## 2021-04-18 ENCOUNTER — Ambulatory Visit: Payer: BC Managed Care – PPO | Admitting: Osteopathic Medicine

## 2021-04-18 VITALS — BP 134/85 | HR 99 | Resp 20 | Ht 66.0 in

## 2021-04-18 DIAGNOSIS — E034 Atrophy of thyroid (acquired): Secondary | ICD-10-CM

## 2021-04-18 DIAGNOSIS — F3341 Major depressive disorder, recurrent, in partial remission: Secondary | ICD-10-CM

## 2021-04-18 DIAGNOSIS — S32012S Unstable burst fracture of first lumbar vertebra, sequela: Secondary | ICD-10-CM

## 2021-04-18 DIAGNOSIS — E611 Iron deficiency: Secondary | ICD-10-CM | POA: Diagnosis not present

## 2021-04-18 DIAGNOSIS — E785 Hyperlipidemia, unspecified: Secondary | ICD-10-CM | POA: Diagnosis not present

## 2021-04-18 DIAGNOSIS — E559 Vitamin D deficiency, unspecified: Secondary | ICD-10-CM

## 2021-04-18 DIAGNOSIS — Z789 Other specified health status: Secondary | ICD-10-CM

## 2021-04-18 DIAGNOSIS — R7303 Prediabetes: Secondary | ICD-10-CM | POA: Diagnosis not present

## 2021-04-18 DIAGNOSIS — F1190 Opioid use, unspecified, uncomplicated: Secondary | ICD-10-CM

## 2021-04-18 MED ORDER — TRIAZOLAM 0.25 MG PO TABS: ORAL_TABLET | ORAL | 0 refills | Status: DC

## 2021-04-18 MED ORDER — SULFAMETHOXAZOLE-TRIMETHOPRIM 800-160 MG PO TABS: 1.0000 | ORAL_TABLET | Freq: Two times a day (BID) | ORAL | 0 refills | Status: DC

## 2021-04-18 NOTE — Progress Notes (Signed)
HPI with pertinent ROS:   CC: Transfer of care,  pain medication, possible UTI  HPI: Nancy Beard 65 year old female presenting today for transfer of care to a new PCP, review of her chronic pain management, and bladder/UTI concerns.  She has quite a complicated medical history, especially since the beginning of this year.  She is under the care of several specialists and currently treated with chronic opioids for management of pain status post vertebral fracture and surgical repair.  She has been using Percocet 5-325 mg 1 tab every 6 hours for severe pain.  Average use of this medication is 4 to 5 days out of every week and notes that she takes it regularly every 6 hours when she is having severe pain.  She is also being treated for pain with gabapentin 1200 mg 3 times a day, baclofen 5 mg twice daily, and amitriptyline 100 mg nightly.  She uses Tylenol as heat/ice and other conservative measures.  She has not tried lidocaine patches to date.  Her last urine drug screen was completed in April 2022 and showed positive for marijuana.  Patient reports that she uses CBD Gummies on days when her pain is severe and the Percocet is not providing relief.  Reports that she was unaware that she had tested positive for this and that this is contraindicated when taking chronic opioids under her current pain agreement.  Most recent pain agreement signed in April 2022.  Has questions about if there is something that she can take for pain that is not quite as strong as Percocet to take in addition to her current Percocet regimen.  Reports that her pain is nerve pain and it feels sharp, stabbing, and shocking when it occurs.  Due to her neurogenic bladder issues, she does have to In-N-Out cath twice daily.  Uses appropriate technique and regularly cleans herself.  She is very careful not to reuse catheters and if she has difficulty with placement into your the urethra, she discards the catheter without retrying.  She does  have urology that follows her situation and notes they told her she has a constant UTI which is to be expected when doing in and out caths.  Unfortunately, she has developed some bilateral CVA tenderness outside of her normal back pain and she is worried that she may have an infection that is progressing to pyelonephritis.  Due to her bladder issues, she is unable to feel frequency, urgency, or burning.  Denies fever, chills, nausea, and vomiting.  I reviewed the past medical history, family history, social history, surgical history, and allergies today and no changes were needed.  Please see the problem list section below in epic for further details.   Physical exam:   General: Well Developed, well nourished, and in no acute distress.  Neuro: Alert and oriented x3.  HEENT: Normocephalic, atraumatic.  Skin: Warm and dry. Cardiac: Regular rate and rhythm, no murmurs rubs or gallops, no lower extremity edema.  Respiratory: Clear to auscultation bilaterally. Not using accessory muscles, speaking in full sentences.  Impression and Recommendations:    1. Prediabetes Checking labs. - COMPLETE METABOLIC PANEL WITH GFR - Hemoglobin A1c  2. Iron deficiency Checking CBC. - CBC with Differential/Platelet  3. Vitamin D deficiency Checking Vitamin D. - Vitamin D 1,25 dihydroxy  4. Recurrent major depressive disorder, in partial remission (HCC) Continue Effexor and Amitriptyline as prescribed.   5. Hypothyroidism due to acquired atrophy of thyroid Checking TSH.  - TSH  6. Hyperlipidemia, unspecified hyperlipidemia type  Checking lipid panel.  - Lipid panel  7. Chronic, continuous use of opioids Drug screen ordered. Due to patients current health challenges and her neurogenic bladder symptoms related to the vertebral fracture earlier this year, she was unable to provide a urine specimen today. She is aware that no more refills of her pain medications will be sent until we have completed the  drug screen. As discussed, she previously tested positive for marijuana which she reports is related to the CBD gummies she occasionally uses when her pain is severe and the Percocet is not providing relief. On review of the Mychart message regarding not continuing the narcotics if found positive for marijuana again, there is no indication that this was ever sent directly to the patient or read by her. Unable to find telephone documentation regarding this result being called to her. Verbal warning provided during our appointment today. She will likely test positive again because she reports she was unaware but going forward, any positive results outside of her prescribed medication will result in cessation of the prescription. Alternatively, suggested that she may consider referral to pain management.  - DRUG MONITOR, PANEL 5, SCREEN, URINE  8. Intermittent self-catheterization of bladder POCT UA ordered but unable to obtain urine. Treating empirically with Bactrim due to high risk for UTI and presenting symptoms.  - POCT Urinalysis Dipstick  9. Closed unstable burst fracture of first lumbar vertebra, sequela Upcoming MRI... sending triazolam for preprocedural anxiolysis. - triazolam (HALCION) 0.25 MG tablet; 1 tab PO 2 hours before procedure or imaging.  Do not drive with this medication.  Dispense: 1 tablet; Refill: 0  Return in about 3 months (around 07/19/2021) for pain management follow up. ___________________________________________ Clearnce Sorrel, DNP, APRN, FNP-BC [facility]

## 2021-04-19 ENCOUNTER — Other Ambulatory Visit: Payer: Self-pay | Admitting: Medical-Surgical

## 2021-04-19 DIAGNOSIS — R531 Weakness: Secondary | ICD-10-CM | POA: Diagnosis not present

## 2021-04-19 DIAGNOSIS — M5451 Vertebrogenic low back pain: Secondary | ICD-10-CM | POA: Diagnosis not present

## 2021-04-19 DIAGNOSIS — R262 Difficulty in walking, not elsewhere classified: Secondary | ICD-10-CM | POA: Diagnosis not present

## 2021-04-21 LAB — CBC WITH DIFFERENTIAL/PLATELET
Absolute Monocytes: 798 cells/uL (ref 200–950)
Basophils Absolute: 50 cells/uL (ref 0–200)
Basophils Relative: 0.6 %
Eosinophils Absolute: 244 cells/uL (ref 15–500)
Eosinophils Relative: 2.9 %
HCT: 43.1 % (ref 35.0–45.0)
Hemoglobin: 14.3 g/dL (ref 11.7–15.5)
Lymphs Abs: 3377 cells/uL (ref 850–3900)
MCH: 28.8 pg (ref 27.0–33.0)
MCHC: 33.2 g/dL (ref 32.0–36.0)
MCV: 86.9 fL (ref 80.0–100.0)
MPV: 11.7 fL (ref 7.5–12.5)
Monocytes Relative: 9.5 %
Neutro Abs: 3931 cells/uL (ref 1500–7800)
Neutrophils Relative %: 46.8 %
Platelets: 330 10*3/uL (ref 140–400)
RBC: 4.96 10*6/uL (ref 3.80–5.10)
RDW: 15.3 % — ABNORMAL HIGH (ref 11.0–15.0)
Total Lymphocyte: 40.2 %
WBC: 8.4 10*3/uL (ref 3.8–10.8)

## 2021-04-21 LAB — TSH: TSH: 3.62 mIU/L (ref 0.40–4.50)

## 2021-04-21 LAB — COMPLETE METABOLIC PANEL WITH GFR
AG Ratio: 1.7 (calc) (ref 1.0–2.5)
ALT: 123 U/L — ABNORMAL HIGH (ref 6–29)
AST: 76 U/L — ABNORMAL HIGH (ref 10–35)
Albumin: 4.8 g/dL (ref 3.6–5.1)
Alkaline phosphatase (APISO): 210 U/L — ABNORMAL HIGH (ref 37–153)
BUN: 13 mg/dL (ref 7–25)
CO2: 28 mmol/L (ref 20–32)
Calcium: 10.2 mg/dL (ref 8.6–10.4)
Chloride: 101 mmol/L (ref 98–110)
Creat: 0.69 mg/dL (ref 0.50–1.05)
Globulin: 2.8 g/dL (calc) (ref 1.9–3.7)
Glucose, Bld: 90 mg/dL (ref 65–99)
Potassium: 5.2 mmol/L (ref 3.5–5.3)
Sodium: 140 mmol/L (ref 135–146)
Total Bilirubin: 0.3 mg/dL (ref 0.2–1.2)
Total Protein: 7.6 g/dL (ref 6.1–8.1)
eGFR: 96 mL/min/{1.73_m2} (ref >=60)

## 2021-04-21 LAB — LIPID PANEL
Cholesterol: 216 mg/dL — ABNORMAL HIGH (ref <200)
HDL: 52 mg/dL (ref >=50)
LDL Cholesterol (Calc): 130 mg/dL (calc) — ABNORMAL HIGH
Non-HDL Cholesterol (Calc): 164 mg/dL (calc) — ABNORMAL HIGH (ref <130)
Total CHOL/HDL Ratio: 4.2 (calc) (ref <5.0)
Triglycerides: 198 mg/dL — ABNORMAL HIGH (ref <150)

## 2021-04-21 LAB — HEMOGLOBIN A1C
Hgb A1c MFr Bld: 5.6 % of total Hgb (ref <5.7)
Mean Plasma Glucose: 114 mg/dL
eAG (mmol/L): 6.3 mmol/L

## 2021-04-21 LAB — VITAMIN D 1,25 DIHYDROXY
Vitamin D 1, 25 (OH)2 Total: 47 pg/mL (ref 18–72)
Vitamin D2 1, 25 (OH)2: <8 pg/mL
Vitamin D3 1, 25 (OH)2: 47 pg/mL

## 2021-04-24 ENCOUNTER — Other Ambulatory Visit: Payer: Self-pay

## 2021-04-24 DIAGNOSIS — S32012S Unstable burst fracture of first lumbar vertebra, sequela: Secondary | ICD-10-CM

## 2021-04-24 DIAGNOSIS — F1190 Opioid use, unspecified, uncomplicated: Secondary | ICD-10-CM

## 2021-04-24 NOTE — Progress Notes (Unsigned)
Pt agreed to referral to pain management clinic.  I have placed referral to pain management.  Sending message as Juluis Rainier only.  Charyl Bigger, CMA

## 2021-04-26 DIAGNOSIS — Z981 Arthrodesis status: Secondary | ICD-10-CM | POA: Diagnosis not present

## 2021-04-26 DIAGNOSIS — M545 Low back pain, unspecified: Secondary | ICD-10-CM | POA: Diagnosis not present

## 2021-04-26 DIAGNOSIS — S32011A Stable burst fracture of first lumbar vertebra, initial encounter for closed fracture: Secondary | ICD-10-CM | POA: Diagnosis not present

## 2021-04-26 DIAGNOSIS — M47816 Spondylosis without myelopathy or radiculopathy, lumbar region: Secondary | ICD-10-CM | POA: Diagnosis not present

## 2021-04-27 DIAGNOSIS — R262 Difficulty in walking, not elsewhere classified: Secondary | ICD-10-CM | POA: Diagnosis not present

## 2021-04-27 DIAGNOSIS — R531 Weakness: Secondary | ICD-10-CM | POA: Diagnosis not present

## 2021-04-27 DIAGNOSIS — M5451 Vertebrogenic low back pain: Secondary | ICD-10-CM | POA: Diagnosis not present

## 2021-04-28 ENCOUNTER — Telehealth: Payer: Self-pay

## 2021-04-28 ENCOUNTER — Other Ambulatory Visit: Payer: Self-pay

## 2021-04-28 NOTE — Telephone Encounter (Signed)
See telephone message

## 2021-04-28 NOTE — Telephone Encounter (Signed)
Nancy Beard wanted to know if she could have an order for a drug test so she can get a prescription of her Oxycodone refilled.

## 2021-05-01 ENCOUNTER — Other Ambulatory Visit: Payer: Self-pay

## 2021-05-01 DIAGNOSIS — F1190 Opioid use, unspecified, uncomplicated: Secondary | ICD-10-CM | POA: Diagnosis not present

## 2021-05-01 NOTE — Progress Notes (Signed)
Placed order

## 2021-05-01 NOTE — Telephone Encounter (Signed)
MyChart message sent to patient.

## 2021-05-02 ENCOUNTER — Ambulatory Visit: Payer: BC Managed Care – PPO | Admitting: Plastic Surgery

## 2021-05-03 ENCOUNTER — Other Ambulatory Visit: Payer: Self-pay | Admitting: Nurse Practitioner

## 2021-05-03 ENCOUNTER — Other Ambulatory Visit: Payer: Self-pay | Admitting: Medical-Surgical

## 2021-05-03 DIAGNOSIS — Z981 Arthrodesis status: Secondary | ICD-10-CM | POA: Diagnosis not present

## 2021-05-03 DIAGNOSIS — R531 Weakness: Secondary | ICD-10-CM | POA: Diagnosis not present

## 2021-05-03 DIAGNOSIS — M5451 Vertebrogenic low back pain: Secondary | ICD-10-CM | POA: Diagnosis not present

## 2021-05-03 DIAGNOSIS — R262 Difficulty in walking, not elsewhere classified: Secondary | ICD-10-CM | POA: Diagnosis not present

## 2021-05-03 MED ORDER — OXYCODONE-ACETAMINOPHEN 5-325 MG PO TABS: 1.0000 | ORAL_TABLET | Freq: Four times a day (QID) | ORAL | 0 refills | Status: AC | PRN

## 2021-05-10 DIAGNOSIS — R531 Weakness: Secondary | ICD-10-CM | POA: Diagnosis not present

## 2021-05-10 DIAGNOSIS — M5451 Vertebrogenic low back pain: Secondary | ICD-10-CM | POA: Diagnosis not present

## 2021-05-10 DIAGNOSIS — R262 Difficulty in walking, not elsewhere classified: Secondary | ICD-10-CM | POA: Diagnosis not present

## 2021-05-11 LAB — DRUG MONITOR, PANEL 5, SCREEN, URINE
Amphetamines: NEGATIVE ng/mL (ref <500)
Barbiturates: NEGATIVE ng/mL (ref <300)
Benzodiazepines: NEGATIVE ng/mL (ref <100)
Cocaine Metabolite: NEGATIVE ng/mL (ref <150)
Creatinine: 172.8 mg/dL (ref >=20.0)
Marijuana Metabolite: POSITIVE ng/mL — AB (ref <20)
Methadone Metabolite: NEGATIVE ng/mL (ref <100)
Opiates: POSITIVE ng/mL — AB (ref <100)
Oxidant: NEGATIVE ug/mL (ref <200)
Oxycodone: NEGATIVE ng/mL (ref <100)
pH: 5.8 (ref 4.5–9.0)

## 2021-05-11 LAB — TEST AUTHORIZATION 2

## 2021-05-11 LAB — DRUG MONITOR, OPIATES,W/CONF, URINE
Codeine: NEGATIVE ng/mL (ref <50)
Hydrocodone: 4284 ng/mL — ABNORMAL HIGH (ref <50)
Hydromorphone: 552 ng/mL — ABNORMAL HIGH (ref <50)
Morphine: NEGATIVE ng/mL (ref <50)
Norhydrocodone: 4229 ng/mL — ABNORMAL HIGH (ref <50)
Opiates: POSITIVE ng/mL — AB (ref <100)

## 2021-05-11 LAB — DM TEMPLATE

## 2021-05-15 DIAGNOSIS — R262 Difficulty in walking, not elsewhere classified: Secondary | ICD-10-CM | POA: Diagnosis not present

## 2021-05-15 DIAGNOSIS — M5451 Vertebrogenic low back pain: Secondary | ICD-10-CM | POA: Diagnosis not present

## 2021-05-15 DIAGNOSIS — R531 Weakness: Secondary | ICD-10-CM | POA: Diagnosis not present

## 2021-05-16 DIAGNOSIS — R399 Unspecified symptoms and signs involving the genitourinary system: Secondary | ICD-10-CM | POA: Diagnosis not present

## 2021-05-16 DIAGNOSIS — R339 Retention of urine, unspecified: Secondary | ICD-10-CM | POA: Diagnosis not present

## 2021-05-18 DIAGNOSIS — F1190 Opioid use, unspecified, uncomplicated: Secondary | ICD-10-CM | POA: Diagnosis not present

## 2021-05-18 DIAGNOSIS — M5416 Radiculopathy, lumbar region: Secondary | ICD-10-CM | POA: Diagnosis not present

## 2021-05-18 DIAGNOSIS — R339 Retention of urine, unspecified: Secondary | ICD-10-CM | POA: Diagnosis not present

## 2021-05-18 DIAGNOSIS — M961 Postlaminectomy syndrome, not elsewhere classified: Secondary | ICD-10-CM | POA: Diagnosis not present

## 2021-05-18 DIAGNOSIS — Z981 Arthrodesis status: Secondary | ICD-10-CM | POA: Diagnosis not present

## 2021-05-21 ENCOUNTER — Other Ambulatory Visit: Payer: Self-pay | Admitting: Nurse Practitioner

## 2021-05-21 DIAGNOSIS — E611 Iron deficiency: Secondary | ICD-10-CM

## 2021-05-23 DIAGNOSIS — M5451 Vertebrogenic low back pain: Secondary | ICD-10-CM | POA: Diagnosis not present

## 2021-05-23 DIAGNOSIS — R262 Difficulty in walking, not elsewhere classified: Secondary | ICD-10-CM | POA: Diagnosis not present

## 2021-05-23 DIAGNOSIS — R531 Weakness: Secondary | ICD-10-CM | POA: Diagnosis not present

## 2021-05-25 DIAGNOSIS — R339 Retention of urine, unspecified: Secondary | ICD-10-CM | POA: Diagnosis not present

## 2021-05-29 DIAGNOSIS — Z981 Arthrodesis status: Secondary | ICD-10-CM | POA: Diagnosis not present

## 2021-05-29 DIAGNOSIS — M5416 Radiculopathy, lumbar region: Secondary | ICD-10-CM | POA: Diagnosis not present

## 2021-05-29 DIAGNOSIS — Z79899 Other long term (current) drug therapy: Secondary | ICD-10-CM | POA: Diagnosis not present

## 2021-05-29 DIAGNOSIS — Z853 Personal history of malignant neoplasm of breast: Secondary | ICD-10-CM | POA: Diagnosis not present

## 2021-05-30 ENCOUNTER — Other Ambulatory Visit: Payer: Self-pay | Admitting: Sports Medicine

## 2021-05-30 DIAGNOSIS — R262 Difficulty in walking, not elsewhere classified: Secondary | ICD-10-CM | POA: Diagnosis not present

## 2021-05-30 DIAGNOSIS — R531 Weakness: Secondary | ICD-10-CM | POA: Diagnosis not present

## 2021-05-30 DIAGNOSIS — S32012S Unstable burst fracture of first lumbar vertebra, sequela: Secondary | ICD-10-CM

## 2021-05-30 DIAGNOSIS — M5451 Vertebrogenic low back pain: Secondary | ICD-10-CM | POA: Diagnosis not present

## 2021-05-31 DIAGNOSIS — N3090 Cystitis, unspecified without hematuria: Secondary | ICD-10-CM | POA: Diagnosis not present

## 2021-05-31 DIAGNOSIS — R339 Retention of urine, unspecified: Secondary | ICD-10-CM | POA: Diagnosis not present

## 2021-06-01 DIAGNOSIS — Z79899 Other long term (current) drug therapy: Secondary | ICD-10-CM | POA: Diagnosis not present

## 2021-06-02 DIAGNOSIS — M5416 Radiculopathy, lumbar region: Secondary | ICD-10-CM | POA: Diagnosis not present

## 2021-06-11 ENCOUNTER — Other Ambulatory Visit: Payer: Self-pay | Admitting: Osteopathic Medicine

## 2021-06-12 DIAGNOSIS — R262 Difficulty in walking, not elsewhere classified: Secondary | ICD-10-CM | POA: Diagnosis not present

## 2021-06-12 DIAGNOSIS — R531 Weakness: Secondary | ICD-10-CM | POA: Diagnosis not present

## 2021-06-12 DIAGNOSIS — M5451 Vertebrogenic low back pain: Secondary | ICD-10-CM | POA: Diagnosis not present

## 2021-06-13 DIAGNOSIS — M5416 Radiculopathy, lumbar region: Secondary | ICD-10-CM | POA: Diagnosis not present

## 2021-06-13 DIAGNOSIS — Z853 Personal history of malignant neoplasm of breast: Secondary | ICD-10-CM | POA: Diagnosis not present

## 2021-06-13 DIAGNOSIS — Z79899 Other long term (current) drug therapy: Secondary | ICD-10-CM | POA: Diagnosis not present

## 2021-06-13 DIAGNOSIS — Z1331 Encounter for screening for depression: Secondary | ICD-10-CM | POA: Diagnosis not present

## 2021-06-13 DIAGNOSIS — Z1339 Encounter for screening examination for other mental health and behavioral disorders: Secondary | ICD-10-CM | POA: Diagnosis not present

## 2021-06-13 DIAGNOSIS — Z981 Arthrodesis status: Secondary | ICD-10-CM | POA: Diagnosis not present

## 2021-06-19 DIAGNOSIS — Z7409 Other reduced mobility: Secondary | ICD-10-CM | POA: Diagnosis not present

## 2021-06-19 DIAGNOSIS — M5451 Vertebrogenic low back pain: Secondary | ICD-10-CM | POA: Diagnosis not present

## 2021-06-19 DIAGNOSIS — N318 Other neuromuscular dysfunction of bladder: Secondary | ICD-10-CM | POA: Diagnosis not present

## 2021-06-19 DIAGNOSIS — R262 Difficulty in walking, not elsewhere classified: Secondary | ICD-10-CM | POA: Diagnosis not present

## 2021-06-19 DIAGNOSIS — Z981 Arthrodesis status: Secondary | ICD-10-CM | POA: Diagnosis not present

## 2021-06-19 DIAGNOSIS — R531 Weakness: Secondary | ICD-10-CM | POA: Diagnosis not present

## 2021-06-19 DIAGNOSIS — Z789 Other specified health status: Secondary | ICD-10-CM | POA: Diagnosis not present

## 2021-06-22 DIAGNOSIS — R531 Weakness: Secondary | ICD-10-CM | POA: Diagnosis not present

## 2021-06-22 DIAGNOSIS — R262 Difficulty in walking, not elsewhere classified: Secondary | ICD-10-CM | POA: Diagnosis not present

## 2021-06-22 DIAGNOSIS — M5451 Vertebrogenic low back pain: Secondary | ICD-10-CM | POA: Diagnosis not present

## 2021-07-03 DIAGNOSIS — R262 Difficulty in walking, not elsewhere classified: Secondary | ICD-10-CM | POA: Diagnosis not present

## 2021-07-03 DIAGNOSIS — R531 Weakness: Secondary | ICD-10-CM | POA: Diagnosis not present

## 2021-07-03 DIAGNOSIS — M5451 Vertebrogenic low back pain: Secondary | ICD-10-CM | POA: Diagnosis not present

## 2021-07-06 DIAGNOSIS — R531 Weakness: Secondary | ICD-10-CM | POA: Diagnosis not present

## 2021-07-06 DIAGNOSIS — R262 Difficulty in walking, not elsewhere classified: Secondary | ICD-10-CM | POA: Diagnosis not present

## 2021-07-06 DIAGNOSIS — M5451 Vertebrogenic low back pain: Secondary | ICD-10-CM | POA: Diagnosis not present

## 2021-07-19 ENCOUNTER — Ambulatory Visit: Payer: BC Managed Care – PPO | Admitting: Medical-Surgical

## 2021-09-05 ENCOUNTER — Other Ambulatory Visit: Payer: Self-pay | Admitting: Medical-Surgical

## 2021-09-14 ENCOUNTER — Other Ambulatory Visit: Payer: Self-pay | Admitting: Osteopathic Medicine

## 2021-10-03 ENCOUNTER — Other Ambulatory Visit: Payer: Self-pay | Admitting: Medical-Surgical

## 2021-10-13 ENCOUNTER — Other Ambulatory Visit: Payer: Self-pay | Admitting: Medical-Surgical

## 2021-10-22 ENCOUNTER — Other Ambulatory Visit: Payer: Self-pay | Admitting: Medical-Surgical

## 2021-10-23 MED ORDER — VENLAFAXINE HCL ER 150 MG PO CP24: ORAL_CAPSULE | ORAL | 1 refills | Status: DC

## 2021-10-23 NOTE — Telephone Encounter (Signed)
Pls contact pt to reschedule appt for medication refills. She no-showed for 07/19/21. No additional refills given. Thanks ?

## 2021-10-23 NOTE — Telephone Encounter (Signed)
Pt has been scheduled for Nancy Beard's next available on 12/05/21, and needed a refill up until her appt date. AM ?

## 2021-11-17 ENCOUNTER — Other Ambulatory Visit: Payer: Self-pay | Admitting: Medical-Surgical

## 2021-12-04 NOTE — Progress Notes (Unsigned)
   Established Patient Office Visit  Subjective   Patient ID: Nancy Beard, female    DOB: 1956-04-20  Age: 66 y.o. MRN: [id number]  No chief complaint on file.   HPI Pleasant 66 year old female presenting today for the following:    ROS    Objective:     LMP 09/11/2012  {Vitals History (Optional):23777}  Physical Exam   No results found for any visits on 12/05/21.  {Labs (Optional):23779}  The 10-year ASCVD risk score (Arnett DK, et al., 2019) is: 6.8%    Assessment & Plan:   Problem List Items Addressed This Visit   None   No follow-ups on file.    Samuel Bouche, NP

## 2021-12-05 ENCOUNTER — Encounter: Payer: Self-pay | Admitting: Medical-Surgical

## 2021-12-05 ENCOUNTER — Ambulatory Visit (INDEPENDENT_AMBULATORY_CARE_PROVIDER_SITE_OTHER): Payer: Medicare Other | Admitting: Medical-Surgical

## 2021-12-05 VITALS — BP 116/81 | HR 102 | Resp 20 | Ht 66.0 in | Wt 151.1 lb

## 2021-12-05 DIAGNOSIS — N3000 Acute cystitis without hematuria: Secondary | ICD-10-CM | POA: Insufficient documentation

## 2021-12-05 DIAGNOSIS — R8290 Unspecified abnormal findings in urine: Secondary | ICD-10-CM | POA: Diagnosis not present

## 2021-12-05 DIAGNOSIS — E559 Vitamin D deficiency, unspecified: Secondary | ICD-10-CM

## 2021-12-05 DIAGNOSIS — E611 Iron deficiency: Secondary | ICD-10-CM

## 2021-12-05 DIAGNOSIS — Z23 Encounter for immunization: Secondary | ICD-10-CM

## 2021-12-05 DIAGNOSIS — R7303 Prediabetes: Secondary | ICD-10-CM

## 2021-12-05 DIAGNOSIS — F3341 Major depressive disorder, recurrent, in partial remission: Secondary | ICD-10-CM | POA: Diagnosis not present

## 2021-12-05 DIAGNOSIS — E034 Atrophy of thyroid (acquired): Secondary | ICD-10-CM

## 2021-12-05 DIAGNOSIS — E785 Hyperlipidemia, unspecified: Secondary | ICD-10-CM | POA: Diagnosis not present

## 2021-12-05 DIAGNOSIS — S32012S Unstable burst fracture of first lumbar vertebra, sequela: Secondary | ICD-10-CM

## 2021-12-05 LAB — POCT URINALYSIS DIP (CLINITEK)
Bilirubin, UA: NEGATIVE
Glucose, UA: NEGATIVE mg/dL
Ketones, POC UA: NEGATIVE mg/dL
Leukocytes, UA: NEGATIVE
Nitrite, UA: POSITIVE — AB
POC PROTEIN,UA: 30 — AB
Spec Grav, UA: 1.025 (ref 1.010–1.025)
Urobilinogen, UA: 0.2 E.U./dL
pH, UA: 5.5 (ref 5.0–8.0)

## 2021-12-05 MED ORDER — VENLAFAXINE HCL ER 150 MG PO CP24: 150.0000 mg | ORAL_CAPSULE | Freq: Every day | ORAL | 1 refills | Status: DC

## 2021-12-05 MED ORDER — AMITRIPTYLINE HCL 100 MG PO TABS: 100.0000 mg | ORAL_TABLET | Freq: Every day | ORAL | 3 refills | Status: DC

## 2021-12-05 MED ORDER — AMOXICILLIN-POT CLAVULANATE 875-125 MG PO TABS: 1.0000 | ORAL_TABLET | Freq: Two times a day (BID) | ORAL | 0 refills | Status: DC

## 2021-12-05 NOTE — Assessment & Plan Note (Signed)
Continue amitriptyline 100 mg nightly and Effexor 150 mg daily.  Symptoms stable outside of chronic pain.

## 2021-12-05 NOTE — Assessment & Plan Note (Signed)
Checking vitamin-D today.

## 2021-12-05 NOTE — Assessment & Plan Note (Signed)
Checking TSH today.  For now continue levothyroxine 112 mcg daily.  May need to titrate medication depending on lab results.

## 2021-12-05 NOTE — Assessment & Plan Note (Signed)
POCT urinalysis positive for nitrites, trace blood, and small amount of protein.  Sending for culture.  Treating with Augmentin twice daily x5 days in the meantime after consult of the last culture results showing ESBL Klebsiella.

## 2021-12-05 NOTE — Assessment & Plan Note (Signed)
Checking iron panel today.

## 2021-12-05 NOTE — Assessment & Plan Note (Signed)
Continue amitriptyline 100 mg nightly.

## 2021-12-05 NOTE — Assessment & Plan Note (Signed)
Checking lipids today.

## 2021-12-05 NOTE — Assessment & Plan Note (Signed)
Checking hemoglobin A1c. 

## 2021-12-06 ENCOUNTER — Other Ambulatory Visit: Payer: Self-pay

## 2021-12-06 ENCOUNTER — Other Ambulatory Visit: Payer: Self-pay | Admitting: Medical-Surgical

## 2021-12-06 LAB — IRON,TIBC AND FERRITIN PANEL
%SAT: 23 % (calc) (ref 16–45)
Ferritin: 50 ng/mL (ref 16–288)
Iron: 80 ug/dL (ref 45–160)
TIBC: 351 mcg/dL (calc) (ref 250–450)

## 2021-12-06 LAB — LIPID PANEL
Cholesterol: 225 mg/dL — ABNORMAL HIGH (ref <200)
HDL: 42 mg/dL — ABNORMAL LOW (ref >=50)
LDL Cholesterol (Calc): 144 mg/dL (calc) — ABNORMAL HIGH
Non-HDL Cholesterol (Calc): 183 mg/dL (calc) — ABNORMAL HIGH (ref <130)
Total CHOL/HDL Ratio: 5.4 (calc) — ABNORMAL HIGH (ref <5.0)
Triglycerides: 241 mg/dL — ABNORMAL HIGH (ref <150)

## 2021-12-06 LAB — HEMOGLOBIN A1C
Hgb A1c MFr Bld: 5.5 % of total Hgb (ref <5.7)
Mean Plasma Glucose: 111 mg/dL
eAG (mmol/L): 6.2 mmol/L

## 2021-12-06 LAB — VITAMIN D 25 HYDROXY (VIT D DEFICIENCY, FRACTURES): Vit D, 25-Hydroxy: 30 ng/mL (ref 30–100)

## 2021-12-06 LAB — TSH: TSH: 2.35 mIU/L (ref 0.40–4.50)

## 2021-12-06 MED ORDER — ATORVASTATIN CALCIUM 10 MG PO TABS: 10.0000 mg | ORAL_TABLET | Freq: Every day | ORAL | 0 refills | Status: DC

## 2021-12-06 MED ORDER — LEVOTHYROXINE SODIUM 112 MCG PO TABS: 112.0000 ug | ORAL_TABLET | Freq: Every day | ORAL | 3 refills | Status: DC

## 2021-12-06 NOTE — Progress Notes (Signed)
Pt informed of results.  Pt expressed understanding and is agreeable.  T. Jaxzen Vanhorn, CMA 

## 2021-12-08 LAB — URINE CULTURE
MICRO NUMBER:: 13437616
SPECIMEN QUALITY:: ADEQUATE

## 2021-12-26 DIAGNOSIS — G9581 Conus medullaris syndrome: Secondary | ICD-10-CM | POA: Insufficient documentation

## 2021-12-26 DIAGNOSIS — Z86718 Personal history of other venous thrombosis and embolism: Secondary | ICD-10-CM | POA: Insufficient documentation

## 2022-02-05 NOTE — Progress Notes (Unsigned)
   Acute Office Visit  Subjective:     Patient ID: Nancy Beard, female    DOB: 01-29-1956, 66 y.o.   MRN: [id number]  No chief complaint on file.   Pt presents to clinic with malodorous urine.      ROS      Objective:    LMP 09/11/2012  {Vitals History (Optional):23777}  Physical Exam  No results found for any visits on 02/06/22.      Assessment & Plan:   Problem List Items Addressed This Visit   None   No orders of the defined types were placed in this encounter.   No follow-ups on file.  Owens Loffler, DO

## 2022-02-06 ENCOUNTER — Ambulatory Visit (INDEPENDENT_AMBULATORY_CARE_PROVIDER_SITE_OTHER): Payer: Medicare Other | Admitting: Family Medicine

## 2022-02-06 ENCOUNTER — Encounter: Payer: Self-pay | Admitting: Family Medicine

## 2022-02-06 VITALS — BP 120/78 | HR 77 | Temp 98.9°F | Ht 66.0 in | Wt 153.0 lb

## 2022-02-06 DIAGNOSIS — N3000 Acute cystitis without hematuria: Secondary | ICD-10-CM | POA: Insufficient documentation

## 2022-02-06 DIAGNOSIS — R030 Elevated blood-pressure reading, without diagnosis of hypertension: Secondary | ICD-10-CM | POA: Diagnosis not present

## 2022-02-06 DIAGNOSIS — R8290 Unspecified abnormal findings in urine: Secondary | ICD-10-CM | POA: Insufficient documentation

## 2022-02-06 LAB — POCT URINALYSIS DIP (CLINITEK)
Bilirubin, UA: NEGATIVE
Glucose, UA: NEGATIVE mg/dL
Ketones, POC UA: NEGATIVE mg/dL
Nitrite, UA: POSITIVE — AB
POC PROTEIN,UA: NEGATIVE
Spec Grav, UA: 1.02 (ref 1.010–1.025)
Urobilinogen, UA: 0.2 E.U./dL
pH, UA: 6 (ref 5.0–8.0)

## 2022-02-06 MED ORDER — SULFAMETHOXAZOLE-TRIMETHOPRIM 800-160 MG PO TABS: 1.0000 | ORAL_TABLET | Freq: Two times a day (BID) | ORAL | 0 refills | Status: AC

## 2022-02-06 NOTE — Patient Instructions (Addendum)
Check blood pressure for the next two weeks and let me know what your numbers are reading  Pick up Bactrim ([id number]) at [facility] for $4.49 retail on [facility]

## 2022-02-06 NOTE — Assessment & Plan Note (Addendum)
-   POC urine interpreted and shows large leukocytes and nitrites positive  - have ordered bactrim to treat for three days

## 2022-02-06 NOTE — Assessment & Plan Note (Signed)
-   on recheck BP normal  - likely elevated upon walking it. Pt is also in pain due to recent back surgery  - have asked pt to keep track of blood pressure over the next two weeks to make sure bp is not consistently high. Provided pt with blood pressure log to record

## 2022-02-10 LAB — URINE CULTURE
MICRO NUMBER:: 13701661
SPECIMEN QUALITY:: ADEQUATE

## 2022-02-13 ENCOUNTER — Encounter: Payer: Medicare Other | Admitting: Family Medicine

## 2022-02-13 ENCOUNTER — Telehealth: Payer: Self-pay | Admitting: General Practice

## 2022-02-13 MED ORDER — AMOXICILLIN-POT CLAVULANATE 875-125 MG PO TABS: 1.0000 | ORAL_TABLET | Freq: Two times a day (BID) | ORAL | 0 refills | Status: AC

## 2022-02-13 NOTE — Progress Notes (Signed)
Appt was cancelled since patient has not had medicare part b longer than one year.This encounter was created in error - please disregard.

## 2022-02-13 NOTE — Telephone Encounter (Signed)
Patient was called to complete her medicare wellness visit. We were not able to complete it because of the length of time she has had medicare. However, patient stated that she feels like she still has a UTI. She has completed the Bactrim that was prescribed. She is requesting more bactrim. Patient was advised that I will ask the doctor and will let her know.

## 2022-02-27 ENCOUNTER — Other Ambulatory Visit: Payer: Self-pay | Admitting: Medical-Surgical

## 2022-03-06 ENCOUNTER — Telehealth: Payer: Self-pay

## 2022-03-06 ENCOUNTER — Ambulatory Visit (INDEPENDENT_AMBULATORY_CARE_PROVIDER_SITE_OTHER): Payer: Medicare Other | Admitting: Family Medicine

## 2022-03-06 DIAGNOSIS — R8290 Unspecified abnormal findings in urine: Secondary | ICD-10-CM

## 2022-03-06 DIAGNOSIS — N390 Urinary tract infection, site not specified: Secondary | ICD-10-CM

## 2022-03-06 DIAGNOSIS — N3000 Acute cystitis without hematuria: Secondary | ICD-10-CM

## 2022-03-06 LAB — POCT URINALYSIS DIP (CLINITEK)
Bilirubin, UA: NEGATIVE
Glucose, UA: NEGATIVE mg/dL
Ketones, POC UA: NEGATIVE mg/dL
Nitrite, UA: POSITIVE — AB
POC PROTEIN,UA: NEGATIVE
Spec Grav, UA: 1.02 (ref 1.010–1.025)
Urobilinogen, UA: 0.2 E.U./dL
pH, UA: 5.5 (ref 5.0–8.0)

## 2022-03-06 MED ORDER — SULFAMETHOXAZOLE-TRIMETHOPRIM 800-160 MG PO TABS: 1.0000 | ORAL_TABLET | Freq: Two times a day (BID) | ORAL | 0 refills | Status: AC

## 2022-03-06 NOTE — Progress Notes (Signed)
Acute Office Visit  Subjective:     Patient ID: Nancy Beard, female    DOB: 11-07-1955, 66 y.o.   MRN: [id number]  No chief complaint on file.   HPI I connected with  Nancy Beard on 03/06/22 by phone and verified that I am speaking with the correct person using two identifiers.Patient was at home. Physician was in office.    I discussed the limitations of evaluation and management by telemedicine. The patient expressed understanding and agreed to proceed.  Pt is expressing symptoms of a urinary tract infection. She describes her urine as malodorous and cloud. She also admits to burning with urination as well. She does self cath. She changes the catheter 3 times a day and does well with her catheter care.   Review of Systems  Constitutional:  Negative for chills and fever.  Respiratory:  Negative for cough and shortness of breath.   Cardiovascular:  Negative for chest pain.  Genitourinary:  Positive for dysuria.  Neurological:  Negative for headaches.        Objective:    LMP 09/11/2012    Physical Exam Vitals reviewed.  Constitutional:      Appearance: She is well-developed.  Pulmonary:     Effort: Pulmonary effort is normal.     Comments: Was able to speak in full sentences without difficulty Neurological:     Mental Status: She is alert and oriented to person, place, and time.  Psychiatric:        Mood and Affect: Mood normal.        Behavior: Behavior normal.        Thought Content: Thought content normal.        Judgment: Judgment normal.     Results for orders placed or performed in visit on 03/06/22  POCT URINALYSIS DIP (CLINITEK)  Result Value Ref Range   Color, UA yellow yellow   Clarity, UA cloudy (A) clear   Glucose, UA negative negative mg/dL   Bilirubin, UA negative negative   Ketones, POC UA negative negative mg/dL   Spec Grav, UA 1.020 1.010 - 1.025   Blood, UA trace-intact (A) negative   pH, UA 5.5 5.0 - 8.0   POC PROTEIN,UA  negative negative, trace   Urobilinogen, UA 0.2 0.2 or 1.0 E.U./dL   Nitrite, UA Positive (A) Negative   Leukocytes, UA Large (3+) (A) Negative        Assessment & Plan:   Problem List Items Addressed This Visit       Genitourinary   Acute cystitis without hematuria    - she was able to drop off a urine sample and then I called her via phone  - interpreted UA results myself which indicated uti showing large leukocytes and positive nitrites - sent in bactrim (last urine culture showed it was susceptible) - did culture urine  - discussed if she had fever or back pain to present to ED or our office - she does self cath but has good cath hygiene - have sent referral to urology for recurrent utis with good self cath care to see if she needs to be on something for daily prevention       Relevant Orders   Ambulatory referral to Urology     Other   Abnormal urine odor - Primary   Relevant Orders   POCT URINALYSIS DIP (CLINITEK) (Completed)   Urine Culture   Other Visit Diagnoses     Recurrent UTI  Relevant Medications   sulfamethoxazole-trimethoprim (BACTRIM DS) 800-160 MG tablet   Other Relevant Orders   Ambulatory referral to Urology       Meds ordered this encounter  Medications   sulfamethoxazole-trimethoprim (BACTRIM DS) 800-160 MG tablet    Sig: Take 1 tablet by mouth 2 (two) times daily for 3 days.    Dispense:  6 tablet    Refill:  0    No follow-ups on file.  Owens Loffler, DO

## 2022-03-06 NOTE — Assessment & Plan Note (Signed)
-   she was able to drop off a urine sample and then I called her via phone  - interpreted UA results myself which indicated uti showing large leukocytes and positive nitrites - sent in bactrim (last urine culture showed it was susceptible) - did culture urine  - discussed if she had fever or back pain to present to ED or our office - she does self cath but has good cath hygiene - have sent referral to urology for recurrent utis with good self cath care to see if she needs to be on something for daily prevention

## 2022-03-06 NOTE — Progress Notes (Deleted)
I connected with  Nancy Beard on 03/06/22 by a phone call enabled and verified that I am speaking with the correct person using two identifiers. Patient was home Physician in office    I discussed the limitations of evaluation and management by telemedicine. The patient expressed understanding and agreed to proceed.

## 2022-03-07 ENCOUNTER — Encounter: Payer: Self-pay | Admitting: General Practice

## 2022-03-09 LAB — URINE CULTURE
MICRO NUMBER:: 13818398
SPECIMEN QUALITY:: ADEQUATE

## 2022-03-12 ENCOUNTER — Ambulatory Visit (INDEPENDENT_AMBULATORY_CARE_PROVIDER_SITE_OTHER): Payer: Medicare Other | Admitting: Family Medicine

## 2022-03-12 VITALS — BP 114/76 | HR 104 | Temp 98.8°F | Ht 66.0 in | Wt 147.0 lb

## 2022-03-12 DIAGNOSIS — R6883 Chills (without fever): Secondary | ICD-10-CM | POA: Diagnosis not present

## 2022-03-12 DIAGNOSIS — N3000 Acute cystitis without hematuria: Secondary | ICD-10-CM | POA: Diagnosis not present

## 2022-03-12 MED ORDER — CEPHALEXIN 500 MG PO CAPS: 500.0000 mg | ORAL_CAPSULE | Freq: Four times a day (QID) | ORAL | 0 refills | Status: AC

## 2022-03-12 NOTE — Assessment & Plan Note (Addendum)
-   tested for flu and interpreted result myself as negative  - covid rapid negative - did covid pcr test, pending

## 2022-03-12 NOTE — Telephone Encounter (Signed)
Error

## 2022-03-12 NOTE — Assessment & Plan Note (Addendum)
-  repeat UA and culture  - likely diarrhea is secondary to the amoxicillin she took that was around her house. Amoxicillin is listed as her allergy causing diarrhea - recommended she take antibiotics with food and preferably a probiotic or greek yogurt/activia that has live cultures in it - non-specific back pain negative CVA tenderness will get CBC too look for infection, and CMP to check kidney function - bactrim sickness likely due to patient taking it on an empty stomach - advised pt to call urology's office and make an apt to see if she can be evaluated for recurrent utis to see if she needs to be on daily pxp or if they need to test her bladder/ do a bag for a while  - counseled on return precautions

## 2022-03-12 NOTE — Addendum Note (Signed)
Addended by: Cline Crock on: 03/12/2022 04:27 PM   Modules accepted: Orders

## 2022-03-12 NOTE — Progress Notes (Signed)
Acute Office Visit  Subjective:     Patient ID: Nancy Beard, female    DOB: [birth year], 66 y.o.   MRN: [id number]  Chief Complaint  Patient presents with   Urinary Tract Infection   Chills    HPI Patient is in today for worsening UTI symptoms along with diarrhea. She was given bactrim against a e coli positive urine culture. She was taking bactrim over the weekend and took it on an empty stomach and had some vomiting and stopped taking it.   A few weeks ago pt had another UTI that was klebsiella positive and was given augmentin. She was referred to urology at our last telehealth visit for recurring UTIs. She self caths three times a day and changes the catheter each time, yet she is getting infections and growing different organisms.   Pt is also experiencing metal changes in her mouth. She also notes some feverish symptoms and some chills. She felt pressure in her bladder which she is unsure if her bladder is starting to regain sensation.  She has very little appetite. Over the weekend she had pears and cottage cheese. She has been able to drink water and liquid IV packets.   Review of Systems  Constitutional:  Positive for chills and fever.  Respiratory:  Negative for cough and shortness of breath.   Cardiovascular:  Negative for chest pain.  Genitourinary:        Pressure feeling, malodorous urine  Musculoskeletal:  Positive for back pain.  Neurological:  Negative for headaches.        Objective:    BP 114/76   Pulse (!) 104   Temp 98.8 F (37.1 C)   Ht 5' 6"  (1.676 m)   Wt 147 lb (66.7 kg)   LMP 09/11/2012   SpO2 97%   BMI 23.73 kg/m    Physical Exam Vitals and nursing note reviewed.  Constitutional:      General: She is not in acute distress.    Appearance: Normal appearance.  HENT:     Head: Normocephalic and atraumatic.     Right Ear: External ear normal.     Left Ear: External ear normal.     Nose: Nose normal.  Eyes:     Conjunctiva/sclera:  Conjunctivae normal.  Cardiovascular:     Rate and Rhythm: Normal rate and regular rhythm.  Pulmonary:     Effort: Pulmonary effort is normal.     Breath sounds: Normal breath sounds.  Genitourinary:    Comments: Negative CVA tenderness bilaterally Neurological:     General: No focal deficit present.     Mental Status: She is alert and oriented to person, place, and time.  Psychiatric:        Mood and Affect: Mood normal.        Behavior: Behavior normal.        Thought Content: Thought content normal.        Judgment: Judgment normal.     No results found for any visits on 03/12/22.      Assessment & Plan:   Problem List Items Addressed This Visit       Genitourinary   Acute cystitis without hematuria - Primary    -repeat UA and culture  - likely diarrhea is secondary to the amoxicillin she took that was around her house. Amoxicillin is listed as her allergy causing diarrhea - recommended she take antibiotics with food and preferably a probiotic or greek yogurt/activia that has live cultures in  it - non-specific back pain negative CVA tenderness will get CBC too look for infection, and CMP to check kidney function - bactrim sickness likely due to patient taking it on an empty stomach - advised pt to call urology's office and make an apt to see if she can be evaluated for recurrent utis to see if she needs to be on daily pxp or if they need to test her bladder/ do a bag for a while  - counseled on return precautions       Relevant Orders   CBC   COMPLETE METABOLIC PANEL WITH GFR     Other   Chills    - tested for flu and is negative  - covid rapid negative - did covid pcr test, pending       Meds ordered this encounter  Medications   cephALEXin (KEFLEX) 500 MG capsule    Sig: Take 1 capsule (500 mg total) by mouth 4 (four) times daily for 5 days.    Dispense:  20 capsule    Refill:  0     Return if symptoms worsen or fail to improve.  Owens Loffler,  DO

## 2022-03-13 LAB — NOVEL CORONAVIRUS, NAA: SARS-CoV-2, NAA: NOT DETECTED

## 2022-03-13 LAB — SPECIMEN STATUS REPORT

## 2022-03-16 LAB — HEMOGLOBIN A1C
Hgb A1c MFr Bld: 5.4 % of total Hgb (ref <5.7)
Mean Plasma Glucose: 108 mg/dL
eAG (mmol/L): 6 mmol/L

## 2022-03-16 LAB — COMPLETE METABOLIC PANEL WITH GFR
AG Ratio: 1.5 (calc) (ref 1.0–2.5)
ALT: 32 U/L — ABNORMAL HIGH (ref 6–29)
AST: 24 U/L (ref 10–35)
Albumin: 4.7 g/dL (ref 3.6–5.1)
Alkaline phosphatase (APISO): 177 U/L — ABNORMAL HIGH (ref 37–153)
BUN: 12 mg/dL (ref 7–25)
CO2: 26 mmol/L (ref 20–32)
Calcium: 9.8 mg/dL (ref 8.6–10.4)
Chloride: 102 mmol/L (ref 98–110)
Creat: 0.84 mg/dL (ref 0.50–1.05)
Globulin: 3.1 g/dL (calc) (ref 1.9–3.7)
Glucose, Bld: 118 mg/dL — ABNORMAL HIGH (ref 65–99)
Potassium: 4.9 mmol/L (ref 3.5–5.3)
Sodium: 138 mmol/L (ref 135–146)
Total Bilirubin: 0.4 mg/dL (ref 0.2–1.2)
Total Protein: 7.8 g/dL (ref 6.1–8.1)
eGFR: 76 mL/min/{1.73_m2} (ref >=60)

## 2022-03-16 LAB — CBC
HCT: 44.4 % (ref 35.0–45.0)
Hemoglobin: 15 g/dL (ref 11.7–15.5)
MCH: 29 pg (ref 27.0–33.0)
MCHC: 33.8 g/dL (ref 32.0–36.0)
MCV: 85.7 fL (ref 80.0–100.0)
MPV: 11.4 fL (ref 7.5–12.5)
Platelets: 382 10*3/uL (ref 140–400)
RBC: 5.18 10*6/uL — ABNORMAL HIGH (ref 3.80–5.10)
RDW: 13.5 % (ref 11.0–15.0)
WBC: 9.4 10*3/uL (ref 3.8–10.8)

## 2022-04-06 ENCOUNTER — Other Ambulatory Visit: Payer: Self-pay | Admitting: Medical-Surgical

## 2022-04-06 NOTE — Telephone Encounter (Signed)
Pt wanted to know if she could come in for labs verses an appointment for refills? Tvt

## 2022-04-06 NOTE — Telephone Encounter (Signed)
Patient needs an appointment for further refills.  Also find out if she transferred care to Dr. Mel Almond in July.  Thank you,  Arbie Cookey, CMA
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
## 2022-04-17 NOTE — Telephone Encounter (Signed)
Pt wanted to know if she could come in for labs verses an appointment for refills? Tvt

## 2022-04-17 NOTE — Telephone Encounter (Signed)
FYI

## 2022-04-18 NOTE — Telephone Encounter (Signed)
Called pt and LVM for patient to schedule for further refills. Buel Ream

## 2022-05-11 ENCOUNTER — Telehealth: Payer: Self-pay | Admitting: Medical-Surgical

## 2022-05-11 NOTE — Telephone Encounter (Signed)
Called patient and found that patient had annual wellness visit on 02/13/22. Nancy Beard

## 2022-05-15 ENCOUNTER — Telehealth: Payer: Self-pay | Admitting: Medical-Surgical

## 2022-05-15 NOTE — Telephone Encounter (Signed)
Patient stated she would like to transfer care from Dr. Charna Archer to Dr. Mel Almond.Please advise.

## 2022-05-17 ENCOUNTER — Ambulatory Visit (INDEPENDENT_AMBULATORY_CARE_PROVIDER_SITE_OTHER): Payer: Medicare Other | Admitting: Family Medicine

## 2022-05-17 ENCOUNTER — Encounter: Payer: Self-pay | Admitting: Family Medicine

## 2022-05-17 VITALS — BP 136/85 | HR 96 | Ht 66.0 in | Wt 156.0 lb

## 2022-05-17 DIAGNOSIS — M79605 Pain in left leg: Secondary | ICD-10-CM | POA: Diagnosis not present

## 2022-05-17 MED ORDER — PREDNISONE 20 MG PO TABS: 20.0000 mg | ORAL_TABLET | Freq: Two times a day (BID) | ORAL | 0 refills | Status: AC

## 2022-05-17 NOTE — Progress Notes (Signed)
Established patient visit   Patient: Nancy Beard   DOB: 07-17-1955   66 y.o. Female  MRN: [id number] Visit Date: 05/17/2022  Today's healthcare provider: Owens Loffler, DO   Chief Complaint  Patient presents with   Leg Pain    SUBJECTIVE    Chief Complaint  Patient presents with   Leg Pain   HPI  Pt presents with left leg pain. She woke up in the middle of the night with pain shooting down her left leg. She has a hx of sciatic nerve pain. She does go to PT for this and notes she will feel better after PT but a few hours later will get sharp pains. She has an injection scheduled for Nov 17th.    Review of Systems  Constitutional:  Negative for activity change, fatigue and fever.  Respiratory:  Negative for cough and shortness of breath.   Cardiovascular:  Negative for chest pain.  Gastrointestinal:  Negative for abdominal pain.  Genitourinary:  Negative for difficulty urinating.  Musculoskeletal:        Leg pain      Current Meds  Medication Sig   acetaminophen (TYLENOL) 325 MG tablet Take by mouth.   AMBULATORY NON FORMULARY MEDICATION BLOOD PRESSURE MONITOR - ARM CUFF PER PATIENT PREFERENCE / INSURANCE COVERAGE, DX: ELEVATED BLOOD PRESSURE   atorvastatin (LIPITOR) 10 MG tablet Take 1 tablet (10 mg total) by mouth daily. NEEDS APPOINTMENT FOR FURTHER REFILLS.   celecoxib (CELEBREX) 200 MG capsule Take 200 mg by mouth 2 (two) times daily.   Cholecalciferol (VITAMIN D-3 PO) Take 1 tablet by mouth daily.   levothyroxine (SYNTHROID) 112 MCG tablet Take 1 tablet (112 mcg total) by mouth daily before breakfast.   methocarbamol (ROBAXIN) 500 MG tablet Take 500-1,000 mg by mouth 4 (four) times daily.   oxyCODONE (OXY IR/ROXICODONE) 5 MG immediate release tablet Take 5 mg by mouth every 4 (four) hours as needed.   predniSONE (DELTASONE) 20 MG tablet Take 1 tablet (20 mg total) by mouth 2 (two) times daily with a meal for 5 days.   pregabalin (LYRICA) 100 MG capsule Take  100 mg by mouth 3 (three) times daily as needed.   venlafaxine XR (EFFEXOR-XR) 150 MG 24 hr capsule Take 1 capsule (150 mg total) by mouth daily with breakfast.    OBJECTIVE    BP 136/85   Pulse 96   Ht 5' 6"  (1.676 m)   Wt 156 lb (70.8 kg)   LMP 09/11/2012   SpO2 98%   BMI 25.18 kg/m   Physical Exam Vitals and nursing note reviewed.  Constitutional:      General: She is not in acute distress.    Appearance: Normal appearance.  HENT:     Head: Normocephalic and atraumatic.     Right Ear: External ear normal.     Left Ear: External ear normal.     Nose: Nose normal.  Eyes:     Conjunctiva/sclera: Conjunctivae normal.  Cardiovascular:     Rate and Rhythm: Normal rate and regular rhythm.  Pulmonary:     Effort: Pulmonary effort is normal.     Breath sounds: Normal breath sounds.  Musculoskeletal:     Comments: SLR negative bilaterally   Neurological:     General: No focal deficit present.     Mental Status: She is alert and oriented to person, place, and time.  Psychiatric:        Mood and Affect: Mood normal.  Behavior: Behavior normal.        Thought Content: Thought content normal.        Judgment: Judgment normal.       ASSESSMENT & PLAN    Problem List Items Addressed This Visit       Other   Pain of left lower extremity - Primary    - likely sciatica related  - provided pt with short course of prednisone to help knock down inflammation - also ordering a CBC and CMP to see if this is related to restless leg syndrome or possibly hypokalemia.  - recommended salon pas patches and taking pain medication prior to physical therapy - continue heat      Relevant Orders   CBC   COMPLETE METABOLIC PANEL WITH GFR    Return if symptoms worsen or fail to improve.      Meds ordered this encounter  Medications   predniSONE (DELTASONE) 20 MG tablet    Sig: Take 1 tablet (20 mg total) by mouth 2 (two) times daily with a meal for 5 days.    Dispense:  10  tablet    Refill:  0    Orders Placed This Encounter  Procedures   CBC   COMPLETE METABOLIC PANEL WITH GFR     Owens Loffler, [place] [phone] (phone) [phone] (fax)  [place]

## 2022-05-17 NOTE — Assessment & Plan Note (Signed)
-   likely sciatica related  - provided pt with short course of prednisone to help knock down inflammation - also ordering a CBC and CMP to see if this is related to restless leg syndrome or possibly hypokalemia.  - recommended salon pas patches and taking pain medication prior to physical therapy - continue heat

## 2022-05-25 LAB — COMPLETE METABOLIC PANEL WITH GFR
AG Ratio: 1.9 (calc) (ref 1.0–2.5)
ALT: 18 U/L (ref 6–29)
AST: 21 U/L (ref 10–35)
Albumin: 4.7 g/dL (ref 3.6–5.1)
Alkaline phosphatase (APISO): 97 U/L (ref 37–153)
BUN: 13 mg/dL (ref 7–25)
CO2: 28 mmol/L (ref 20–32)
Calcium: 9.6 mg/dL (ref 8.6–10.4)
Chloride: 104 mmol/L (ref 98–110)
Creat: 0.65 mg/dL (ref 0.50–1.05)
Globulin: 2.5 g/dL (calc) (ref 1.9–3.7)
Glucose, Bld: 115 mg/dL — ABNORMAL HIGH (ref 65–99)
Potassium: 4.7 mmol/L (ref 3.5–5.3)
Sodium: 142 mmol/L (ref 135–146)
Total Bilirubin: 0.4 mg/dL (ref 0.2–1.2)
Total Protein: 7.2 g/dL (ref 6.1–8.1)
eGFR: 96 mL/min/{1.73_m2} (ref >=60)

## 2022-05-25 LAB — TEST AUTHORIZATION

## 2022-05-25 LAB — CBC
HCT: 42.9 % (ref 35.0–45.0)
Hemoglobin: 14.2 g/dL (ref 11.7–15.5)
MCH: 29.1 pg (ref 27.0–33.0)
MCHC: 33.1 g/dL (ref 32.0–36.0)
MCV: 87.9 fL (ref 80.0–100.0)
MPV: 12.2 fL (ref 7.5–12.5)
Platelets: 255 10*3/uL (ref 140–400)
RBC: 4.88 10*6/uL (ref 3.80–5.10)
RDW: 14.1 % (ref 11.0–15.0)
WBC: 7.2 10*3/uL (ref 3.8–10.8)

## 2022-05-25 LAB — MAGNESIUM: Magnesium: 2.2 mg/dL (ref 1.5–2.5)

## 2022-05-28 ENCOUNTER — Encounter: Payer: Medicare Other | Admitting: Family Medicine

## 2022-07-09 ENCOUNTER — Other Ambulatory Visit: Payer: Self-pay | Admitting: Medical-Surgical

## 2022-07-13 ENCOUNTER — Ambulatory Visit (INDEPENDENT_AMBULATORY_CARE_PROVIDER_SITE_OTHER): Payer: Medicare Other | Admitting: Sports Medicine

## 2022-07-13 VITALS — BP 127/70 | HR 92 | Ht 66.0 in | Wt 161.0 lb

## 2022-07-13 DIAGNOSIS — N390 Urinary tract infection, site not specified: Secondary | ICD-10-CM

## 2022-07-13 DIAGNOSIS — N3001 Acute cystitis with hematuria: Secondary | ICD-10-CM

## 2022-07-13 LAB — POCT URINALYSIS DIP (CLINITEK)
Bilirubin, UA: NEGATIVE
Glucose, UA: NEGATIVE mg/dL
Ketones, POC UA: NEGATIVE mg/dL
Nitrite, UA: POSITIVE — AB
POC PROTEIN,UA: 30 — AB
Spec Grav, UA: >=1.03 — AB (ref 1.010–1.025)
Urobilinogen, UA: 0.2 E.U./dL
pH, UA: 6 (ref 5.0–8.0)

## 2022-07-13 MED ORDER — LEVOFLOXACIN 750 MG PO TABS: 750.0000 mg | ORAL_TABLET | Freq: Every day | ORAL | 0 refills | Status: DC

## 2022-07-13 NOTE — Progress Notes (Signed)
Pt came in today c/o her urine being cloudy,odorous,and burning for the past 2 days. She denies any f/s/c/n/v/d, abdominal/back/flank pain.   Pt is incontinent and does I/O caths due to a spinal injury. She was last seen by her Urologist in September. She informed me that she did reach out to their office and was told that she would need to be seen by her PCP because there was no one at the office that could see her due to the holidays.

## 2022-07-13 NOTE — Assessment & Plan Note (Addendum)
 Pleasant 66 year old female, does in and out catheterizations, now having increasing odor, cloudy urine, dysuria, no fevers, chills, flank pain. Urinalysis is positive for blood, leukocytes, nitrates, previous urine cultures have grown out relatively pansensitive E. coli, patient is penicillin allergic, the best MIC on her last urine culture was with Levaquin , so we will add Levaquin  750 mg daily for 7 days. Urine should be cultured this time as well.  Update:  Urine culture is positive, growing out Klebsiella pneumoniae also highly sensitive to levofloxacin , no change in plan.

## 2022-07-13 NOTE — Progress Notes (Addendum)
    Procedures performed today:    None.  Independent interpretation of notes and tests performed by another provider:   None.  Brief History, Exam, Impression, and Recommendations:    Acute cystitis Pleasant 66 year old female, does in and out catheterizations, now having increasing odor, cloudy urine, dysuria, no fevers, chills, flank pain. Urinalysis is positive for blood, leukocytes, nitrates, previous urine cultures have grown out relatively pansensitive E. coli, patient is penicillin allergic, the best MIC on her last urine culture was with Levaquin , so we will add Levaquin  750 mg daily for 7 days. Urine should be cultured this time as well.  Update:  Urine culture is positive, growing out Klebsiella pneumoniae also highly sensitive to levofloxacin , no change in plan.   ____________________________________________ Debby PARAS. Curtis, M.D., ABFM., CAQSM., AME. Primary Care and Sports Medicine [facility]  Adjunct Professor of [facility] Medicine  [employer]  [employer], [employer]

## 2022-07-16 LAB — URINE CULTURE
MICRO NUMBER:: 14373245
SPECIMEN QUALITY:: ADEQUATE

## 2022-07-28 ENCOUNTER — Other Ambulatory Visit: Payer: Self-pay | Admitting: Medical-Surgical

## 2022-09-01 ENCOUNTER — Encounter: Payer: Self-pay | Admitting: Emergency Medicine

## 2022-09-01 ENCOUNTER — Other Ambulatory Visit: Payer: Self-pay | Admitting: Medical-Surgical

## 2022-09-01 ENCOUNTER — Other Ambulatory Visit: Payer: Self-pay

## 2022-09-01 ENCOUNTER — Ambulatory Visit
Admission: EM | Admit: 2022-09-01 | Discharge: 2022-09-01 | Disposition: A | Discharge status: Home / Self Care | Payer: Medicare Other | Attending: Family Medicine | Admitting: Family Medicine

## 2022-09-01 DIAGNOSIS — T25222A Burn of second degree of left foot, initial encounter: Secondary | ICD-10-CM | POA: Diagnosis not present

## 2022-09-01 MED ORDER — CEPHALEXIN 500 MG PO CAPS: 500.0000 mg | ORAL_CAPSULE | Freq: Two times a day (BID) | ORAL | 0 refills | Status: DC

## 2022-09-01 MED ORDER — SILVER SULFADIAZINE 1 % EX CREA: 1.0000 | TOPICAL_CREAM | Freq: Every day | CUTANEOUS | 0 refills | Status: DC

## 2022-09-01 NOTE — ED Provider Notes (Signed)
[facility]    CSN: [id number] Arrival date & time: 09/01/22  1510      History   Chief Complaint Chief Complaint  Patient presents with   Foot Pain    HPI Nancy Beard is a 67 y.o. female.   HPI  Patient is a chronic pain patient.  She takes regular oxycodone and Lyrica.  She states that she does not have feeling in her left foot.  She states that she burned the bottom of her left foot on a heating pad, because she does not have feeling in that foot.  The area is oozing.  She is concerned for infection.  She would like this checked  Past Medical History:  Diagnosis Date   Anxiety    Blood transfusion without reported diagnosis 2014   Breast cancer (HCC) 2014   right, bil mast   Celiac disease    Depression    DIVERTICULOSIS, COLON 05/05/2007   DVT (deep venous thrombosis) (HCC) 2014   Right lower extremity, thigh   DVT (deep venous thrombosis) (HCC) 06/30/2013   August 2014. Continued Xarelto due to persistent chronic DVT R popliteal vein. Plan repeat per oncology March 2016    Fracture of left tibial plateau 06/15/2015   Fracture tibia/fibula 06/14/2015   HEMORRHOIDS, EXTERNAL 05/05/2007   History of chemotherapy    Hypothyroidism    Neuropathy    of fingers and toes   Pneumonia    hx of years ago   Thyroid disease    Vitamin D insufficiency 06/20/2015   Wears glasses     Patient Active Problem List   Diagnosis Date Noted   Pain of left lower extremity 05/17/2022   Chills 03/12/2022   Elevated blood pressure reading 02/06/2022   Acute cystitis 12/05/2021   Closed avulsion fracture of distal fibula, left, initial encounter 11/18/2020   L1 burst fracture with instrumentation and spinal cord compression 11/18/2020   Acute deep vein thrombosis (DVT) of proximal vein of right lower extremity (HCC) see [place] 10/20/20- Xarelto started 10/20/20 10/20/2020   Screening for HIV (human immunodeficiency virus) 04/28/2020   Need for vaccination with  13-polyvalent pneumococcal conjugate vaccine 04/28/2020   Need for influenza vaccination 04/28/2020   Screening for colon cancer 04/28/2020   Elevated liver enzymes 11/12/2018   Prediabetes 11/12/2018   History of high cholesterol 11/12/2018   Herpes simplex 04/12/2017   Abnormal transaminases 06/27/2015   Iron deficiency 06/24/2015   Vitamin D deficiency 06/20/2015   Genital herpes 09/06/2014   Neuropathy 09/06/2014   Anticoagulant long-term use 05/01/2014   Persistent headaches 02/22/2014   Hot flashes related to aromatase inhibitor therapy 12/10/2013   Vulvar intraepithelial neoplasia III (VIN III) 06/30/2013   Status post bilateral breast reconstruction 06/30/2013   Osteoporosis 05/13/2013   Acquired absence of bilateral breasts and nipples 09/23/2012   Primary cancer of lower-inner quadrant of right female breast (HCC) 06/24/2012   Hyperlipidemia 05/13/2008   Hypothyroidism 01/31/2007   Depression 01/31/2007   Celiac disease/sprue 01/31/2007    Past Surgical History:  Procedure Laterality Date   AXILLARY SENTINEL NODE BIOPSY Right 09/11/2012   Procedure: AXILLARY SENTINEL lymph NODE  BIOPSY;  Surgeon: Adolph Pollack, MD;  Location: [facility] OR;  Service: General;  Laterality: Right;  right nuclear medicine injection 12:30    BREAST LUMPECTOMY     benign lump   BREAST RECONSTRUCTION Right 09/24/2013   Procedure: REVISION OF RIGHT BREAST RECONSTRUCTION WITH REPOSITIONING RIGHT IMPLANT, POSSIBLE EXCISION CAPSULAR CONTRACTURE AND LIPOFILLING  FOR FAT GRAFTING;  Surgeon: Wayland Denis, DO;  Location: [facility];  Service: Plastics;  Laterality: Right;   BREAST RECONSTRUCTION WITH PLACEMENT OF TISSUE EXPANDER AND FLEX HD (ACELLULAR HYDRATED DERMIS) Bilateral 09/11/2012   Procedure: BREAST RECONSTRUCTION WITH PLACEMENT OF TISSUE EXPANDER AND FLEX HD (ACELLULAR HYDRATED DERMIS) ADM;  Surgeon: Wayland Denis, DO;  Location: [facility] OR;  Service: Plastics;  Laterality: Bilateral;    COLONOSCOPY  12/20/2004   Leone Payor   cryoblation of cervix     long time ago   ESOPHAGOGASTRODUODENOSCOPY  2007   sprue   EXTERNAL FIXATION LEG Left 06/15/2015   Procedure: EXTERNAL FIXATION LEG;  Surgeon: Myrene Galas, MD;  Location: [facility] OR;  Service: Orthopedics;  Laterality: Left;   INCISION AND DRAINAGE OF WOUND Left 09/16/2012   Procedure: Left Breast Evacuation of Hematoma;  Surgeon: Wayland Denis, DO;  Location: [facility] OR;  Service: Plastics;  Laterality: Left;   LIPOSUCTION WITH LIPOFILLING Bilateral 09/24/2013   Procedure: LIPOSUCTION WITH LIPOFILLING;  Surgeon: Wayland Denis, DO;  Location: [facility];  Service: Plastics;  Laterality: Bilateral;  Biltalteal filling breast   LIPOSUCTION WITH LIPOFILLING Bilateral 06/08/2020   Procedure: LIPOSUCTION WITH LIPOFILLING;  Surgeon: Peggye Form, DO;  Location: [facility];  Service: Plastics;  Laterality: Bilateral;  90 min, please   ORIF TIBIA PLATEAU Left 06/21/2015   Procedure: OPEN REDUCTION INTERNAL FIXATION (ORIF) TIBIAL PLATEAU REMOVAL OF EXTERNAL FISTULA;  Surgeon: Myrene Galas, MD;  Location: [facility] OR;  Service: Orthopedics;  Laterality: Left;   PORT-A-CATH REMOVAL Right 08/07/2013   Procedure: REMOVAL PORT-A-CATH;  Surgeon: Adolph Pollack, MD;  Location: [facility];  Service: General;  Laterality: Right;   PORTACATH PLACEMENT Right 10/09/2012   Procedure: US GUIDED INSERTION PORT-A-CATH;  Surgeon: Adolph Pollack, MD;  Location: [facility];  Service: General;  Laterality: Right;  Right Subclavian Vein   REMOVAL OF BILATERAL TISSUE EXPANDERS WITH PLACEMENT OF BILATERAL BREAST IMPLANTS Bilateral 06/24/2013   Procedure: REMOVAL OF BILATERAL TISSUE EXPANDERS WITH PLACEMENT OF BILATERAL BREAST IMPLANTS;  Surgeon: Wayland Denis, DO;  Location: [facility];  Service: Plastics;  Laterality: Bilateral;   TOTAL MASTECTOMY Bilateral 09/11/2012   Procedure: bilateral  MASTECTOMY;  Surgeon: Adolph Pollack, MD;  Location: [facility] OR;  Service: General;  Laterality: Bilateral;   VULVECTOMY N/A 07/14/2013   Procedure: WIDE LOCAL  EXCISION VULVAR;  Surgeon: Jeannette Corpus, MD;  Location: [facility];  Service: Gynecology;  Laterality: N/A;    OB History   No obstetric history on file.      Home Medications    Prior to Admission medications   Medication Sig Start Date End Date Taking? Authorizing Provider  acetaminophen (TYLENOL) 325 MG tablet Take by mouth. 09/28/20  Yes [provider]  AMBULATORY NON FORMULARY MEDICATION BLOOD PRESSURE MONITOR - ARM CUFF PER PATIENT PREFERENCE / INSURANCE COVERAGE, DX: ELEVATED BLOOD PRESSURE 02/22/20  Yes Sunnie Nielsen, DO  atorvastatin (LIPITOR) 10 MG tablet TAKE 1 TABLET (10 MG TOTAL) BY MOUTH DAILY. NEEDS APPOINTMENT FOR FURTHER REFILLS. 07/30/22  Yes Christen Butter, NP  celecoxib (CELEBREX) 200 MG capsule Take 200 mg by mouth 2 (two) times daily.   Yes [provider]  cephALEXin (KEFLEX) 500 MG capsule Take 1 capsule (500 mg total) by mouth 2 (two) times daily. 09/01/22  Yes Eustace Moore, MD  Cholecalciferol (VITAMIN D-3 PO) Take 1 tablet by mouth daily.   Yes [provider]  levofloxacin (  LEVAQUIN) 750 MG tablet Take 1 tablet (750 mg total) by mouth daily. 07/13/22  Yes Monica Becton, MD  levothyroxine (SYNTHROID) 112 MCG tablet Take 1 tablet (112 mcg total) by mouth daily before breakfast. 12/06/21  Yes Christen Butter, NP  methocarbamol (ROBAXIN) 500 MG tablet Take 500-1,000 mg by mouth 4 (four) times daily. 02/04/22  Yes [provider]  oxyCODONE (OXY IR/ROXICODONE) 5 MG immediate release tablet Take 5 mg by mouth every 4 (four) hours as needed. 02/11/22  Yes [provider]  pregabalin (LYRICA) 100 MG capsule Take 100 mg by mouth 3 (three) times daily as needed. 12/01/21  Yes [provider]  silver sulfADIAZINE (SILVADENE) 1 % cream Apply 1  Application topically daily. 09/01/22  Yes Eustace Moore, MD  venlafaxine XR (EFFEXOR-XR) 150 MG 24 hr capsule Take 1 capsule (150 mg total) by mouth daily with breakfast. 12/05/21  Yes Christen Butter, NP    Family History Family History  Problem Relation Age of Onset   Breast cancer Paternal Aunt        dx in her 16s   Lymphoma Paternal Uncle    Breast cancer Paternal Grandmother        dx >50   Heart attack Paternal Grandfather    Breast cancer Other        2 maternal great aunts with breast cancer >50   Osteoporosis Mother        controlled with diet/exercise   COPD Father    Colon cancer Neg Hx    Colon polyps Neg Hx    Esophageal cancer Neg Hx    Rectal cancer Neg Hx    Stomach cancer Neg Hx     Social History Social History   Tobacco Use   Smoking status: Never   Smokeless tobacco: Never  Vaping Use   Vaping Use: Never used  Substance Use Topics   Alcohol use: No   Drug use: No     Allergies   Ciprofloxacin, Hydromorphone hcl, Nitrofurantoin, Tizanidine, Gluten meal, Melatonin, and Amoxicillin   Review of Systems Review of Systems  See HPI Physical Exam Triage Vital Signs ED Triage Vitals  Enc Vitals Group     BP 09/01/22 1522 (!) 144/93     Pulse Rate 09/01/22 1522 96     Resp 09/01/22 1522 18     Temp 09/01/22 1522 98.2 F (36.8 C)     Temp Source 09/01/22 1522 Oral     SpO2 09/01/22 1522 95 %     Weight --      Height --      Head Circumference --      Peak Flow --      Pain Score 09/01/22 1524 0     Pain Loc --      Pain Edu? --      Excl. in GC? --    No data found.  Updated Vital Signs BP (!) 144/93 (BP Location: Left Arm)   Pulse 96   Temp 98.2 F (36.8 C) (Oral)   Resp 18   LMP 09/11/2012   SpO2 95%       Physical Exam Constitutional:      General: She is not in acute distress.    Appearance: She is well-developed.  HENT:     Head: Normocephalic and atraumatic.  Eyes:     Conjunctiva/sclera: Conjunctivae normal.      Pupils: Pupils are equal, round, and reactive to light.  Cardiovascular:  Rate and Rhythm: Normal rate.  Pulmonary:     Effort: Pulmonary effort is normal. No respiratory distress.  Abdominal:     General: There is no distension.     Palpations: Abdomen is soft.  Musculoskeletal:        General: Normal range of motion.     Cervical back: Normal range of motion.  Skin:    General: Skin is warm and dry.     Comments: See photo.  There is a burn on the bottom of the left heel off undetermined depth, serosanguineous drainage,  Neurological:     Mental Status: She is alert.     Gait: Gait abnormal.      UC Treatments / Results  Labs (all labs ordered are listed, but only abnormal results are displayed) Labs Reviewed - No data to display  EKG   Radiology No results found.  Procedures Procedures (including critical care time)  Medications Ordered in UC Medications - No data to display  Initial Impression / Assessment and Plan / UC Course  I have reviewed the triage vital signs and the nursing notes.  Pertinent labs & imaging results that were available during my care of the patient were reviewed by me and considered in my medical decision making (see chart for details).     Reviewed wound care.  Burn dressing demonstrated to patient and her husband.  Daily dressings recommended.  Will need ongoing wound care. Final Clinical Impressions(s) / UC Diagnoses   Final diagnoses:  Partial thickness burn of left foot, initial encounter     Discharge Instructions      Take Keflex 2 times a day You should take a probiotic twice a day with the Keflex.  This will help prevent diarrhea Wash the area once a day and apply Silvadene dressing Walk on your toe on your left foot to prevent trauma Call your doctors office Monday morning for a recheck next week     ED Prescriptions     Medication Sig Dispense Auth. Provider   silver sulfADIAZINE (SILVADENE) 1 % cream Apply 1  Application topically daily. 50 g Eustace Moore, MD   cephALEXin (KEFLEX) 500 MG capsule Take 1 capsule (500 mg total) by mouth 2 (two) times daily. 10 capsule Eustace Moore, MD      I have reviewed the PDMP during this encounter.   Eustace Moore, MD 09/02/22 0930

## 2022-09-01 NOTE — Discharge Instructions (Signed)
Take Keflex 2 times a day You should take a probiotic twice a day with the Keflex.  This will help prevent diarrhea Wash the area once a day and apply Silvadene dressing Walk on your toe on your left foot to prevent trauma Call your doctors office Monday morning for a recheck next week

## 2022-09-01 NOTE — ED Triage Notes (Signed)
Patient states that she burned the bottom of left foot on a heating pad.  Patient doesn't have any feeling in the foot from an old injury.  The area is oozing.

## 2022-09-02 ENCOUNTER — Telehealth: Payer: Self-pay | Admitting: Emergency Medicine

## 2022-09-02 NOTE — Telephone Encounter (Signed)
Message left requesting a call back to patient to schedule an appointment for evaluation of unstageable wound on bottom of left foot. Follow up call to Nancy Beard to update her on call placed to [facility]ound Care center and directed patient to call [place] on Monday to schedule an appointment ASAP. Voice mail left with call back #

## 2022-09-10 ENCOUNTER — Encounter (HOSPITAL_BASED_OUTPATIENT_CLINIC_OR_DEPARTMENT_OTHER): Payer: Medicare Other | Attending: Internal Medicine | Admitting: General Surgery

## 2022-09-11 DIAGNOSIS — T25322A Burn of third degree of left foot, initial encounter: Secondary | ICD-10-CM | POA: Insufficient documentation

## 2022-09-17 ENCOUNTER — Ambulatory Visit (HOSPITAL_BASED_OUTPATIENT_CLINIC_OR_DEPARTMENT_OTHER): Payer: Medicare Other | Admitting: Internal Medicine

## 2022-09-21 ENCOUNTER — Other Ambulatory Visit: Payer: Self-pay | Admitting: Medical-Surgical

## 2022-09-24 ENCOUNTER — Ambulatory Visit (HOSPITAL_BASED_OUTPATIENT_CLINIC_OR_DEPARTMENT_OTHER): Payer: Medicare Other | Admitting: Internal Medicine

## 2022-10-04 NOTE — Progress Notes (Signed)
Established patient visit   Patient: Nancy Beard   DOB: [birth year]   67 y.o. Female  MRN: [id number] Visit Date: 10/05/2022  Today's healthcare provider: Owens Loffler, DO   Chief Complaint  Patient presents with   Urinary Tract Infection    SUBJECTIVE    Chief Complaint  Patient presents with   Urinary Tract Infection   HPI  Pt presents today for symptoms of dysuria. Cloudy urine and odor for the past two days.   Review of Systems  Constitutional:  Negative for activity change, fatigue and fever.  Respiratory:  Negative for cough and shortness of breath.   Cardiovascular:  Negative for chest pain.  Gastrointestinal:  Negative for abdominal pain.  Genitourinary:  Positive for dysuria. Negative for difficulty urinating.       Current Meds  Medication Sig   acetaminophen (TYLENOL) 325 MG tablet Take by mouth.   AMBULATORY NON FORMULARY MEDICATION BLOOD PRESSURE MONITOR - ARM CUFF PER PATIENT PREFERENCE / INSURANCE COVERAGE, DX: ELEVATED BLOOD PRESSURE   atorvastatin (LIPITOR) 10 MG tablet TAKE 1 TABLET BY MOUTH DAILY *NEED APPOINTMENT FOR REFILLS*   celecoxib (CELEBREX) 200 MG capsule Take 200 mg by mouth 2 (two) times daily.   cephALEXin (KEFLEX) 500 MG capsule Take 1 capsule (500 mg total) by mouth 4 (four) times daily for 5 days.   Cholecalciferol (VITAMIN D-3 PO) Take 1 tablet by mouth daily.   levothyroxine (SYNTHROID) 112 MCG tablet Take 1 tablet (112 mcg total) by mouth daily before breakfast.   oxyCODONE (OXY IR/ROXICODONE) 5 MG immediate release tablet Take 5 mg by mouth every 4 (four) hours as needed.   pregabalin (LYRICA) 100 MG capsule Take 100 mg by mouth 3 (three) times daily as needed.   silver sulfADIAZINE (SILVADENE) 1 % cream Apply 1 Application topically daily.   venlafaxine XR (EFFEXOR-XR) 150 MG 24 hr capsule Take 1 capsule (150 mg total) by mouth daily with breakfast.    OBJECTIVE    BP (!) 136/93   Pulse (!) 106   Temp 97.9 F (36.6  C) (Oral)   Ht 5\' 6"  (1.676 m)   Wt 162 lb (73.5 kg)   LMP 09/11/2012   SpO2 95%   BMI 26.15 kg/m   Physical Exam Vitals and nursing note reviewed.  Constitutional:      General: She is not in acute distress.    Appearance: Normal appearance.  HENT:     Head: Normocephalic and atraumatic.     Right Ear: External ear normal.     Left Ear: External ear normal.     Nose: Nose normal.  Eyes:     Conjunctiva/sclera: Conjunctivae normal.  Cardiovascular:     Rate and Rhythm: Normal rate.  Pulmonary:     Effort: Pulmonary effort is normal.  Neurological:     General: No focal deficit present.     Mental Status: She is alert and oriented to person, place, and time.  Psychiatric:        Mood and Affect: Mood normal.        Behavior: Behavior normal.        Thought Content: Thought content normal.        Judgment: Judgment normal.          ASSESSMENT & PLAN    Problem List Items Addressed This Visit       Genitourinary   Acute cystitis - Primary    Patient presents with dysuria for 2 days.  She does self cath.  UA done in clinic shows positive nitrates and positive blood - UA sample is not enough to culture - Will go ahead and treat with Keflex -This is patient's third UTI in 6 months; it is most likely worth it to start daily Keflex 250 mg for prophylactic treatment since she is self cathing      Relevant Orders   POCT URINALYSIS DIP (CLINITEK) (Completed)     Other   Dysuria    Patient has symptoms of UTI.  Point-of-care UA done in clinic which shows positive for nitrates      Relevant Orders   POCT URINALYSIS DIP (CLINITEK) (Completed)    No follow-ups on file.      Meds ordered this encounter  Medications   cephALEXin (KEFLEX) 500 MG capsule    Sig: Take 1 capsule (500 mg total) by mouth 4 (four) times daily for 5 days.    Dispense:  20 capsule    Refill:  0    Orders Placed This Encounter  Procedures   POCT URINALYSIS DIP (CLINITEK)      Owens Loffler, [place] at [facility] [phone] (phone) [phone] (fax)  [place]

## 2022-10-05 ENCOUNTER — Ambulatory Visit (INDEPENDENT_AMBULATORY_CARE_PROVIDER_SITE_OTHER): Payer: Medicare Other | Admitting: Family Medicine

## 2022-10-05 VITALS — BP 136/93 | HR 106 | Temp 97.9°F | Ht 66.0 in | Wt 162.0 lb

## 2022-10-05 DIAGNOSIS — F3341 Major depressive disorder, recurrent, in partial remission: Secondary | ICD-10-CM | POA: Diagnosis not present

## 2022-10-05 DIAGNOSIS — N3001 Acute cystitis with hematuria: Secondary | ICD-10-CM | POA: Diagnosis not present

## 2022-10-05 DIAGNOSIS — I824Y1 Acute embolism and thrombosis of unspecified deep veins of right proximal lower extremity: Secondary | ICD-10-CM | POA: Diagnosis not present

## 2022-10-05 DIAGNOSIS — R300 Dysuria: Secondary | ICD-10-CM | POA: Diagnosis not present

## 2022-10-05 LAB — POCT URINALYSIS DIP (CLINITEK)
Bilirubin, UA: NEGATIVE
Glucose, UA: NEGATIVE mg/dL
Nitrite, UA: POSITIVE — AB
POC PROTEIN,UA: 100 — AB
Spec Grav, UA: 1.025 (ref 1.010–1.025)
Urobilinogen, UA: 0.2 E.U./dL
pH, UA: 6 (ref 5.0–8.0)

## 2022-10-05 MED ORDER — CEPHALEXIN 500 MG PO CAPS: 500.0000 mg | ORAL_CAPSULE | Freq: Four times a day (QID) | ORAL | 0 refills | Status: AC

## 2022-10-05 NOTE — Assessment & Plan Note (Signed)
Patient presents with dysuria for 2 days.  She does self cath.  UA done in clinic shows positive nitrates and positive blood - UA sample is not enough to culture - Will go ahead and treat with Keflex -This is patient's third UTI in 6 months; it is most likely worth it to start daily Keflex 250 mg for prophylactic treatment since she is self cathing

## 2022-10-05 NOTE — Assessment & Plan Note (Signed)
Patient has symptoms of UTI.  Point-of-care UA done in clinic which shows positive for nitrates

## 2022-10-12 ENCOUNTER — Other Ambulatory Visit: Payer: Self-pay | Admitting: Medical-Surgical

## 2022-10-12 ENCOUNTER — Encounter: Payer: Self-pay | Admitting: Family Medicine

## 2022-10-15 ENCOUNTER — Other Ambulatory Visit: Payer: Self-pay | Admitting: Family Medicine

## 2022-10-15 DIAGNOSIS — N3001 Acute cystitis with hematuria: Secondary | ICD-10-CM

## 2022-10-15 MED ORDER — CEPHALEXIN 500 MG PO CAPS: 500.0000 mg | ORAL_CAPSULE | Freq: Two times a day (BID) | ORAL | 0 refills | Status: AC

## 2022-11-20 ENCOUNTER — Telehealth: Payer: Self-pay | Admitting: General Practice

## 2022-11-20 NOTE — Telephone Encounter (Signed)
Called patient to schedule Medicare Annual Wellness Visit (AWV). Left message for patient to call back and schedule Medicare Annual Wellness Visit (AWV).  Last date of AWV: no date listed  Please schedule an appointment at any time with nurse health advisor.  If any questions, please contact me at [phone].  Thank you ,  Modesto Charon, RN BSN

## 2022-11-23 ENCOUNTER — Other Ambulatory Visit: Payer: Self-pay | Admitting: Medical-Surgical

## 2023-01-06 ENCOUNTER — Other Ambulatory Visit: Payer: Self-pay | Admitting: Medical-Surgical

## 2023-01-10 ENCOUNTER — Other Ambulatory Visit: Payer: Self-pay | Admitting: Medical-Surgical

## 2023-01-14 ENCOUNTER — Other Ambulatory Visit: Payer: Self-pay | Admitting: Medical-Surgical

## 2023-01-14 MED ORDER — VENLAFAXINE HCL ER 150 MG PO CP24: 150.0000 mg | ORAL_CAPSULE | Freq: Every day | ORAL | 0 refills | Status: DC

## 2023-01-25 ENCOUNTER — Ambulatory Visit
Admission: EM | Admit: 2023-01-25 | Discharge: 2023-01-25 | Disposition: A | Discharge status: Home / Self Care | Payer: Medicare Other | Attending: Family Medicine | Admitting: Family Medicine

## 2023-01-25 DIAGNOSIS — N3001 Acute cystitis with hematuria: Secondary | ICD-10-CM

## 2023-01-25 DIAGNOSIS — R300 Dysuria: Secondary | ICD-10-CM | POA: Diagnosis not present

## 2023-01-25 LAB — POCT URINALYSIS DIP (MANUAL ENTRY)
Bilirubin, UA: NEGATIVE
Glucose, UA: NEGATIVE mg/dL
Ketones, POC UA: NEGATIVE mg/dL
Nitrite, UA: POSITIVE — AB
Protein Ur, POC: 30 mg/dL — AB
Spec Grav, UA: 1.02 (ref 1.010–1.025)
Urobilinogen, UA: 0.2 E.U./dL
pH, UA: 7 (ref 5.0–8.0)

## 2023-01-25 MED ORDER — SULFAMETHOXAZOLE-TRIMETHOPRIM 800-160 MG PO TABS: 1.0000 | ORAL_TABLET | Freq: Two times a day (BID) | ORAL | 0 refills | Status: AC

## 2023-01-25 NOTE — ED Provider Notes (Signed)
[facility]    CSN: [id number] Arrival date & time: 01/25/23  1543      History   Chief Complaint Chief Complaint  Patient presents with   Dysuria    HPI Nancy Beard is a 67 y.o. female.   HPI Very pleasant 67 year old female presents with dysuria for 2 days.  PMH significant for vulvar intraepithelial neoplasia 3, breast cancer, and neuropathy.  Patient is accompanied by her husband this afternoon.  Past Medical History:  Diagnosis Date   Anxiety    Blood transfusion without reported diagnosis 2014   Breast cancer (HCC) 2014   right, bil mast   Celiac disease    Depression    DIVERTICULOSIS, COLON 05/05/2007   DVT (deep venous thrombosis) (HCC) 2014   Right lower extremity, thigh   DVT (deep venous thrombosis) (HCC) 06/30/2013   August 2014. Continued Xarelto due to persistent chronic DVT R popliteal vein. Plan repeat per oncology March 2016    Fracture of left tibial plateau 06/15/2015   Fracture tibia/fibula 06/14/2015   HEMORRHOIDS, EXTERNAL 05/05/2007   History of chemotherapy    Hypothyroidism    Neuropathy    of fingers and toes   Pneumonia    hx of years ago   Thyroid disease    Vitamin D insufficiency 06/20/2015   Wears glasses     Patient Active Problem List   Diagnosis Date Noted   Dysuria 10/05/2022   Recurrent major depressive disorder, in partial remission (HCC) 10/05/2022   Pain of left lower extremity 05/17/2022   Chills 03/12/2022   Elevated blood pressure reading 02/06/2022   Acute cystitis 12/05/2021   Closed avulsion fracture of distal fibula, left, initial encounter 11/18/2020   L1 burst fracture with instrumentation and spinal cord compression 11/18/2020   Acute deep vein thrombosis (DVT) of proximal vein of right lower extremity (HCC) see [place] 10/20/20- Xarelto started 10/20/20 10/20/2020   Screening for HIV (human immunodeficiency virus) 04/28/2020   Need for vaccination with 13-polyvalent pneumococcal conjugate vaccine  04/28/2020   Need for influenza vaccination 04/28/2020   Screening for colon cancer 04/28/2020   Elevated liver enzymes 11/12/2018   Prediabetes 11/12/2018   History of high cholesterol 11/12/2018   Herpes simplex 04/12/2017   Abnormal transaminases 06/27/2015   Iron deficiency 06/24/2015   Vitamin D deficiency 06/20/2015   Genital herpes 09/06/2014   Neuropathy 09/06/2014   Anticoagulant long-term use 05/01/2014   Persistent headaches 02/22/2014   Hot flashes related to aromatase inhibitor therapy 12/10/2013   Vulvar intraepithelial neoplasia III (VIN III) 06/30/2013   Status post bilateral breast reconstruction 06/30/2013   Osteoporosis 05/13/2013   Acquired absence of bilateral breasts and nipples 09/23/2012   Primary cancer of lower-inner quadrant of right female breast (HCC) 06/24/2012   Hyperlipidemia 05/13/2008   Hypothyroidism 01/31/2007   Depression 01/31/2007   Celiac disease/sprue 01/31/2007    Past Surgical History:  Procedure Laterality Date   AXILLARY SENTINEL NODE BIOPSY Right 09/11/2012   Procedure: AXILLARY SENTINEL lymph NODE  BIOPSY;  Surgeon: Adolph Pollack, MD;  Location: [facility] OR;  Service: General;  Laterality: Right;  right nuclear medicine injection 12:30    BREAST LUMPECTOMY     benign lump   BREAST RECONSTRUCTION Right 09/24/2013   Procedure: REVISION OF RIGHT BREAST RECONSTRUCTION WITH REPOSITIONING RIGHT IMPLANT, POSSIBLE EXCISION CAPSULAR CONTRACTURE AND LIPOFILLING FOR FAT GRAFTING;  Surgeon: Wayland Denis, DO;  Location: [facility];  Service: Plastics;  Laterality: Right;   BREAST RECONSTRUCTION  WITH PLACEMENT OF TISSUE EXPANDER AND FLEX HD (ACELLULAR HYDRATED DERMIS) Bilateral 09/11/2012   Procedure: BREAST RECONSTRUCTION WITH PLACEMENT OF TISSUE EXPANDER AND FLEX HD (ACELLULAR HYDRATED DERMIS) ADM;  Surgeon: Wayland Denis, DO;  Location: [facility] OR;  Service: Plastics;  Laterality: Bilateral;   COLONOSCOPY  12/20/2004   Leone Payor    cryoblation of cervix     long time ago   ESOPHAGOGASTRODUODENOSCOPY  2007   sprue   EXTERNAL FIXATION LEG Left 06/15/2015   Procedure: EXTERNAL FIXATION LEG;  Surgeon: Myrene Galas, MD;  Location: [facility] OR;  Service: Orthopedics;  Laterality: Left;   INCISION AND DRAINAGE OF WOUND Left 09/16/2012   Procedure: Left Breast Evacuation of Hematoma;  Surgeon: Wayland Denis, DO;  Location: [facility] OR;  Service: Plastics;  Laterality: Left;   LIPOSUCTION WITH LIPOFILLING Bilateral 09/24/2013   Procedure: LIPOSUCTION WITH LIPOFILLING;  Surgeon: Wayland Denis, DO;  Location: [facility];  Service: Plastics;  Laterality: Bilateral;  Biltalteal filling breast   LIPOSUCTION WITH LIPOFILLING Bilateral 06/08/2020   Procedure: LIPOSUCTION WITH LIPOFILLING;  Surgeon: Peggye Form, DO;  Location: [facility];  Service: Plastics;  Laterality: Bilateral;  90 min, please   ORIF TIBIA PLATEAU Left 06/21/2015   Procedure: OPEN REDUCTION INTERNAL FIXATION (ORIF) TIBIAL PLATEAU REMOVAL OF EXTERNAL FISTULA;  Surgeon: Myrene Galas, MD;  Location: [facility] OR;  Service: Orthopedics;  Laterality: Left;   PORT-A-CATH REMOVAL Right 08/07/2013   Procedure: REMOVAL PORT-A-CATH;  Surgeon: Adolph Pollack, MD;  Location: [facility];  Service: General;  Laterality: Right;   PORTACATH PLACEMENT Right 10/09/2012   Procedure: US GUIDED INSERTION PORT-A-CATH;  Surgeon: Adolph Pollack, MD;  Location: [facility];  Service: General;  Laterality: Right;  Right Subclavian Vein   REMOVAL OF BILATERAL TISSUE EXPANDERS WITH PLACEMENT OF BILATERAL BREAST IMPLANTS Bilateral 06/24/2013   Procedure: REMOVAL OF BILATERAL TISSUE EXPANDERS WITH PLACEMENT OF BILATERAL BREAST IMPLANTS;  Surgeon: Wayland Denis, DO;  Location:  SURGERY CENTER;  Service: Plastics;  Laterality: Bilateral;   TOTAL MASTECTOMY Bilateral 09/11/2012   Procedure: bilateral MASTECTOMY;  Surgeon: Adolph Pollack,  MD;  Location: [facility] OR;  Service: General;  Laterality: Bilateral;   VULVECTOMY N/A 07/14/2013   Procedure: WIDE LOCAL  EXCISION VULVAR;  Surgeon: Jeannette Corpus, MD;  Location: [facility] ORS;  Service: Gynecology;  Laterality: N/A;    OB History   No obstetric history on file.      Home Medications    Prior to Admission medications   Medication Sig Start Date End Date Taking? Authorizing Provider  sulfamethoxazole-trimethoprim (BACTRIM DS) 800-160 MG tablet Take 1 tablet by mouth 2 (two) times daily for 7 days. 01/25/23 02/01/23 Yes Trevor Iha, FNP  acetaminophen (TYLENOL) 325 MG tablet Take by mouth. 09/28/20   [provider]  AMBULATORY NON FORMULARY MEDICATION BLOOD PRESSURE MONITOR - ARM CUFF PER PATIENT PREFERENCE / INSURANCE COVERAGE, DX: ELEVATED BLOOD PRESSURE 02/22/20   Sunnie Nielsen, DO  atorvastatin (LIPITOR) 10 MG tablet Take 1 tablet (10 mg total) by mouth at bedtime. 11/28/22   Charlton Amor, DO  celecoxib (CELEBREX) 200 MG capsule Take 200 mg by mouth 2 (two) times daily.    [provider]  Cholecalciferol (VITAMIN D-3 PO) Take 1 tablet by mouth daily.    [provider]  levothyroxine (SYNTHROID) 112 MCG tablet TAKE 1 TABLET BY MOUTH DAILY BEFORE BREAKFAST. 01/07/23   Morey Hummingbird S, DO  oxyCODONE (OXY IR/ROXICODONE) 5 MG immediate release  tablet Take 5 mg by mouth every 4 (four) hours as needed. 02/11/22   [provider]  pregabalin (LYRICA) 100 MG capsule Take 100 mg by mouth 3 (three) times daily as needed. 12/01/21   [provider]  silver sulfADIAZINE (SILVADENE) 1 % cream Apply 1 Application topically daily. 09/01/22   Eustace Moore, MD  venlafaxine XR (EFFEXOR-XR) 150 MG 24 hr capsule Take 1 capsule (150 mg total) by mouth daily with breakfast. 01/14/23   Charlton Amor, DO    Family History Family History  Problem Relation Age of Onset   Breast cancer Paternal Aunt        dx in her 38s   Lymphoma Paternal  Uncle    Breast cancer Paternal Grandmother        dx >50   Heart attack Paternal Grandfather    Breast cancer Other        2 maternal great aunts with breast cancer >50   Osteoporosis Mother        controlled with diet/exercise   COPD Father    Colon cancer Neg Hx    Colon polyps Neg Hx    Esophageal cancer Neg Hx    Rectal cancer Neg Hx    Stomach cancer Neg Hx     Social History Social History   Tobacco Use   Smoking status: Never   Smokeless tobacco: Never  Vaping Use   Vaping status: Never Used  Substance Use Topics   Alcohol use: No   Drug use: No     Allergies   Ciprofloxacin, Hydromorphone hcl, Nitrofurantoin, Tizanidine, Gluten meal, Melatonin, and Amoxicillin   Review of Systems Review of Systems   Physical Exam Triage Vital Signs ED Triage Vitals [01/25/23 1601]  Encounter Vitals Group     BP (!) 146/86     Systolic BP Percentile      Diastolic BP Percentile      Pulse Rate (!) 106     Resp 16     Temp 97.8 F (36.6 C)     Temp src      SpO2 98 %     Weight      Height      Head Circumference      Peak Flow      Pain Score 2     Pain Loc      Pain Education      Exclude from Growth Chart    No data found.  Updated Vital Signs BP (!) 146/86   Pulse (!) 106   Temp 97.8 F (36.6 C)   Resp 16   LMP 09/11/2012   SpO2 98%    Physical Exam Vitals and nursing note reviewed.  Constitutional:      Appearance: Normal appearance. She is normal weight.  HENT:     Head: Normocephalic and atraumatic.     Mouth/Throat:     Mouth: Mucous membranes are moist.     Pharynx: Oropharynx is clear.  Eyes:     Extraocular Movements: Extraocular movements intact.     Conjunctiva/sclera: Conjunctivae normal.     Pupils: Pupils are equal, round, and reactive to light.  Cardiovascular:     Rate and Rhythm: Normal rate and regular rhythm.     Pulses: Normal pulses.     Heart sounds: Normal heart sounds.  Pulmonary:     Effort: Pulmonary effort is  normal.     Breath sounds: Normal breath sounds. No wheezing, rhonchi or rales.  Abdominal:     Tenderness: There is no right CVA tenderness or left CVA tenderness.  Musculoskeletal:        General: Normal range of motion.     Cervical back: Normal range of motion and neck supple.  Skin:    General: Skin is warm and dry.  Neurological:     General: No focal deficit present.     Mental Status: She is alert and oriented to person, place, and time. Mental status is at baseline.  Psychiatric:        Mood and Affect: Mood normal.        Behavior: Behavior normal.      UC Treatments / Results  Labs (all labs ordered are listed, but only abnormal results are displayed) Labs Reviewed  POCT URINALYSIS DIP (MANUAL ENTRY) - Abnormal; Notable for the following components:      Result Value   Clarity, UA cloudy (*)    Blood, UA moderate (*)    Protein Ur, POC =30 (*)    Nitrite, UA Positive (*)    Leukocytes, UA Moderate (2+) (*)    All other components within normal limits  URINE CULTURE    EKG   Radiology No results found.  Procedures Procedures (including critical care time)  Medications Ordered in UC Medications - No data to display  Initial Impression / Assessment and Plan / UC Course  I have reviewed the triage vital signs and the nursing notes.  Pertinent labs & imaging results that were available during my care of the patient were reviewed by me and considered in my medical decision making (see chart for details).     MDM: 1.  Acute cystitis with hematuria-Rx'd Bactrim DS 800/160 mg tablet twice daily x 7 days; 2.  Dysuria-UA revealed above, urine culture ordered. Instructed patient to take medication as directed with food to completion.  Encouraged increase daily water intake to 64 ounces per day while taking this medication.  Advised patient we will follow-up with urine culture results once received.  Advised if symptoms worsen and/or unresolved please follow-up with  PCP, urology, or here for further evaluation.  Patient discharged home, hemodynamically stable.   Final Clinical Impressions(s) / UC Diagnoses   Final diagnoses:  Acute cystitis with hematuria  Dysuria     Discharge Instructions      Instructed patient to take medication as directed with food to completion.  Encouraged increase daily water intake to 64 ounces per day while taking this medication.  Advised patient we will follow-up with urine culture results once received.  Advised if symptoms worsen and/or unresolved please follow-up with PCP, urology, or here for further evaluation.     ED Prescriptions     Medication Sig Dispense Auth. Provider   sulfamethoxazole-trimethoprim (BACTRIM DS) 800-160 MG tablet Take 1 tablet by mouth 2 (two) times daily for 7 days. 14 tablet Trevor Iha, FNP      PDMP not reviewed this encounter.   Trevor Iha, FNP 01/25/23 1623

## 2023-01-25 NOTE — Discharge Instructions (Addendum)
Instructed patient to take medication as directed with food to completion.  Encouraged increase daily water intake to 64 ounces per day while taking this medication.  Advised patient we will follow-up with urine culture results once received.  Advised if symptoms worsen and/or unresolved please follow-up with PCP, urology, or here for further evaluation.

## 2023-01-25 NOTE — ED Triage Notes (Signed)
Pt presents to [facility] with co of dysuria for 2 days. Pt reports urologist could not get her in and advised her to go to pcp or uc for care. Reports she has to self cath and she has been having these symptoms about 2 days

## 2023-01-26 LAB — URINE CULTURE

## 2023-01-27 LAB — URINE CULTURE: Culture: >=100000 — AB

## 2023-02-01 DIAGNOSIS — S32511A Fracture of superior rim of right pubis, initial encounter for closed fracture: Secondary | ICD-10-CM | POA: Insufficient documentation

## 2023-02-01 DIAGNOSIS — S7001XA Contusion of right hip, initial encounter: Secondary | ICD-10-CM | POA: Insufficient documentation

## 2023-02-23 ENCOUNTER — Other Ambulatory Visit: Payer: Self-pay | Admitting: Family Medicine

## 2023-04-10 ENCOUNTER — Other Ambulatory Visit: Payer: Self-pay | Admitting: Family Medicine

## 2023-04-25 ENCOUNTER — Other Ambulatory Visit: Payer: Self-pay

## 2023-04-25 ENCOUNTER — Ambulatory Visit
Admission: EM | Admit: 2023-04-25 | Discharge: 2023-04-25 | Disposition: A | Discharge status: Home / Self Care | Payer: Medicare Other | Attending: Family Medicine | Admitting: Family Medicine

## 2023-04-25 DIAGNOSIS — N3001 Acute cystitis with hematuria: Secondary | ICD-10-CM | POA: Insufficient documentation

## 2023-04-25 DIAGNOSIS — R300 Dysuria: Secondary | ICD-10-CM | POA: Diagnosis not present

## 2023-04-25 LAB — POCT URINALYSIS DIP (MANUAL ENTRY)
Bilirubin, UA: NEGATIVE
Glucose, UA: NEGATIVE mg/dL
Ketones, POC UA: NEGATIVE mg/dL
Nitrite, UA: POSITIVE — AB
Protein Ur, POC: 30 mg/dL — AB
Spec Grav, UA: >=1.03 — AB (ref 1.010–1.025)
Urobilinogen, UA: 0.2 U/dL
pH, UA: 6 (ref 5.0–8.0)

## 2023-04-25 MED ORDER — SULFAMETHOXAZOLE-TRIMETHOPRIM 800-160 MG PO TABS: 1.0000 | ORAL_TABLET | Freq: Two times a day (BID) | ORAL | 0 refills | Status: AC

## 2023-04-25 NOTE — ED Provider Notes (Signed)
[facility]    CSN: [id number] Arrival date & time: 04/25/23  1547      History   Chief Complaint Chief Complaint  Patient presents with   Dysuria    HPI Nancy Beard is a 67 y.o. female.    HPI PMH significant for breast cancer, celiac disease, and history of DVT.  Past Medical History:  Diagnosis Date   Anxiety    Blood transfusion without reported diagnosis 2014   Breast cancer (HCC) 2014   right, bil mast   Celiac disease    Depression    DIVERTICULOSIS, COLON 05/05/2007   DVT (deep venous thrombosis) (HCC) 2014   Right lower extremity, thigh   DVT (deep venous thrombosis) (HCC) 06/30/2013   August 2014. Continued Xarelto due to persistent chronic DVT R popliteal vein. Plan repeat per oncology March 2016    Fracture of left tibial plateau 06/15/2015   Fracture tibia/fibula 06/14/2015   HEMORRHOIDS, EXTERNAL 05/05/2007   History of chemotherapy    Hypothyroidism    Neuropathy    of fingers and toes   Pneumonia    hx of years ago   Thyroid disease    Vitamin D insufficiency 06/20/2015   Wears glasses     Patient Active Problem List   Diagnosis Date Noted   Dysuria 10/05/2022   Recurrent major depressive disorder, in partial remission (HCC) 10/05/2022   Pain of left lower extremity 05/17/2022   Chills 03/12/2022   Elevated blood pressure reading 02/06/2022   Acute cystitis 12/05/2021   Closed avulsion fracture of distal fibula, left, initial encounter 11/18/2020   L1 burst fracture with instrumentation and spinal cord compression 11/18/2020   Acute deep vein thrombosis (DVT) of proximal vein of right lower extremity (HCC) see [place] 10/20/20- Xarelto started 10/20/20 10/20/2020   Screening for HIV (human immunodeficiency virus) 04/28/2020   Need for vaccination with 13-polyvalent pneumococcal conjugate vaccine 04/28/2020   Need for influenza vaccination 04/28/2020   Screening for colon cancer 04/28/2020   Elevated liver enzymes 11/12/2018    Prediabetes 11/12/2018   History of high cholesterol 11/12/2018   Herpes simplex 04/12/2017   Abnormal transaminases 06/27/2015   Iron deficiency 06/24/2015   Vitamin D deficiency 06/20/2015   Genital herpes 09/06/2014   Neuropathy 09/06/2014   Anticoagulant long-term use 05/01/2014   Persistent headaches 02/22/2014   Hot flashes related to aromatase inhibitor therapy 12/10/2013   Vulvar intraepithelial neoplasia III (VIN III) 06/30/2013   Status post bilateral breast reconstruction 06/30/2013   Osteoporosis 05/13/2013   Acquired absence of bilateral breasts and nipples 09/23/2012   Primary cancer of lower-inner quadrant of right female breast (HCC) 06/24/2012   Hyperlipidemia 05/13/2008   Hypothyroidism 01/31/2007   Depression 01/31/2007   Celiac disease/sprue 01/31/2007    Past Surgical History:  Procedure Laterality Date   AXILLARY SENTINEL NODE BIOPSY Right 09/11/2012   Procedure: AXILLARY SENTINEL lymph NODE  BIOPSY;  Surgeon: Adolph Pollack, MD;  Location: [facility] OR;  Service: General;  Laterality: Right;  right nuclear medicine injection 12:30    BREAST LUMPECTOMY     benign lump   BREAST RECONSTRUCTION Right 09/24/2013   Procedure: REVISION OF RIGHT BREAST RECONSTRUCTION WITH REPOSITIONING RIGHT IMPLANT, POSSIBLE EXCISION CAPSULAR CONTRACTURE AND LIPOFILLING FOR FAT GRAFTING;  Surgeon: Wayland Denis, DO;  Location: [facility];  Service: Plastics;  Laterality: Right;   BREAST RECONSTRUCTION WITH PLACEMENT OF TISSUE EXPANDER AND FLEX HD (ACELLULAR HYDRATED DERMIS) Bilateral 09/11/2012   Procedure: BREAST RECONSTRUCTION WITH  PLACEMENT OF TISSUE EXPANDER AND FLEX HD (ACELLULAR HYDRATED DERMIS) ADM;  Surgeon: Wayland Denis, DO;  Location: [facility] OR;  Service: Plastics;  Laterality: Bilateral;   COLONOSCOPY  12/20/2004   Leone Payor   cryoblation of cervix     long time ago   ESOPHAGOGASTRODUODENOSCOPY  2007   sprue   EXTERNAL FIXATION LEG Left 06/15/2015   Procedure:  EXTERNAL FIXATION LEG;  Surgeon: Myrene Galas, MD;  Location: [facility] OR;  Service: Orthopedics;  Laterality: Left;   INCISION AND DRAINAGE OF WOUND Left 09/16/2012   Procedure: Left Breast Evacuation of Hematoma;  Surgeon: Wayland Denis, DO;  Location: [facility] OR;  Service: Plastics;  Laterality: Left;   LIPOSUCTION WITH LIPOFILLING Bilateral 09/24/2013   Procedure: LIPOSUCTION WITH LIPOFILLING;  Surgeon: Wayland Denis, DO;  Location: [facility];  Service: Plastics;  Laterality: Bilateral;  Biltalteal filling breast   LIPOSUCTION WITH LIPOFILLING Bilateral 06/08/2020   Procedure: LIPOSUCTION WITH LIPOFILLING;  Surgeon: Peggye Form, DO;  Location: [facility];  Service: Plastics;  Laterality: Bilateral;  90 min, please   ORIF TIBIA PLATEAU Left 06/21/2015   Procedure: OPEN REDUCTION INTERNAL FIXATION (ORIF) TIBIAL PLATEAU REMOVAL OF EXTERNAL FISTULA;  Surgeon: Myrene Galas, MD;  Location: [facility] OR;  Service: Orthopedics;  Laterality: Left;   PORT-A-CATH REMOVAL Right 08/07/2013   Procedure: REMOVAL PORT-A-CATH;  Surgeon: Adolph Pollack, MD;  Location: [facility];  Service: General;  Laterality: Right;   PORTACATH PLACEMENT Right 10/09/2012   Procedure: US GUIDED INSERTION PORT-A-CATH;  Surgeon: Adolph Pollack, MD;  Location: [facility];  Service: General;  Laterality: Right;  Right Subclavian Vein   REMOVAL OF BILATERAL TISSUE EXPANDERS WITH PLACEMENT OF BILATERAL BREAST IMPLANTS Bilateral 06/24/2013   Procedure: REMOVAL OF BILATERAL TISSUE EXPANDERS WITH PLACEMENT OF BILATERAL BREAST IMPLANTS;  Surgeon: Wayland Denis, DO;  Location:  SURGERY CENTER;  Service: Plastics;  Laterality: Bilateral;   TOTAL MASTECTOMY Bilateral 09/11/2012   Procedure: bilateral MASTECTOMY;  Surgeon: Adolph Pollack, MD;  Location: [facility] OR;  Service: General;  Laterality: Bilateral;   VULVECTOMY N/A 07/14/2013   Procedure: WIDE LOCAL  EXCISION VULVAR;   Surgeon: Jeannette Corpus, MD;  Location: [facility];  Service: Gynecology;  Laterality: N/A;    OB History   No obstetric history on file.      Home Medications    Prior to Admission medications   Medication Sig Start Date End Date Taking? Authorizing Provider  sulfamethoxazole-trimethoprim (BACTRIM DS) 800-160 MG tablet Take 1 tablet by mouth 2 (two) times daily for 7 days. 04/25/23 05/02/23 Yes Trevor Iha, FNP  sulfamethoxazole-trimethoprim (BACTRIM DS) 800-160 MG tablet Take 1 tablet by mouth 2 (two) times daily for 7 days. 04/25/23 05/02/23 Yes Trevor Iha, FNP  acetaminophen (TYLENOL) 325 MG tablet Take by mouth. 09/28/20   [provider]  AMBULATORY NON FORMULARY MEDICATION BLOOD PRESSURE MONITOR - ARM CUFF PER PATIENT PREFERENCE / INSURANCE COVERAGE, DX: ELEVATED BLOOD PRESSURE 02/22/20   Sunnie Nielsen, DO  atorvastatin (LIPITOR) 10 MG tablet TAKE 1 TABLET BY MOUTH EVERYDAY AT BEDTIME 02/25/23   Morey Hummingbird S, DO  celecoxib (CELEBREX) 200 MG capsule Take 200 mg by mouth 2 (two) times daily.    [provider]  Cholecalciferol (VITAMIN D-3 PO) Take 1 tablet by mouth daily.    [provider]  levothyroxine (SYNTHROID) 112 MCG tablet TAKE 1 TABLET BY MOUTH DAILY BEFORE BREAKFAST. 01/07/23   Morey Hummingbird S, DO  oxyCODONE (OXY IR/ROXICODONE)  5 MG immediate release tablet Take 5 mg by mouth every 4 (four) hours as needed. 02/11/22   [provider]  pregabalin (LYRICA) 100 MG capsule Take 100 mg by mouth 3 (three) times daily as needed. 12/01/21   [provider]  silver sulfADIAZINE (SILVADENE) 1 % cream Apply 1 Application topically daily. 09/01/22   Eustace Moore, MD  venlafaxine XR (EFFEXOR-XR) 150 MG 24 hr capsule TAKE 1 CAPSULE BY MOUTH DAILY WITH BREAKFAST 04/10/23   Charlton Amor, DO    Family History Family History  Problem Relation Age of Onset   Breast cancer Paternal Aunt        dx in her 61s   Lymphoma Paternal  Uncle    Breast cancer Paternal Grandmother        dx >50   Heart attack Paternal Grandfather    Breast cancer Other        2 maternal great aunts with breast cancer >50   Osteoporosis Mother        controlled with diet/exercise   COPD Father    Colon cancer Neg Hx    Colon polyps Neg Hx    Esophageal cancer Neg Hx    Rectal cancer Neg Hx    Stomach cancer Neg Hx     Social History Social History   Tobacco Use   Smoking status: Never   Smokeless tobacco: Never  Vaping Use   Vaping status: Never Used  Substance Use Topics   Alcohol use: No   Drug use: No     Allergies   Ciprofloxacin, Hydromorphone hcl, Nitrofurantoin, Tizanidine, Gluten meal, Melatonin, and Amoxicillin   Review of Systems Review of Systems  Genitourinary:  Positive for dysuria and frequency.     Physical Exam Triage Vital Signs ED Triage Vitals  Encounter Vitals Group     BP      Systolic BP Percentile      Diastolic BP Percentile      Pulse      Resp      Temp      Temp src      SpO2      Weight      Height      Head Circumference      Peak Flow      Pain Score      Pain Loc      Pain Education      Exclude from Growth Chart    No data found.  Updated Vital Signs BP 120/75 (BP Location: Left Arm)   Pulse 68   Temp 97.8 F (36.6 C) (Oral)   Resp 16   LMP 09/11/2012   SpO2 99%    Physical Exam Vitals and nursing note reviewed.  Constitutional:      Appearance: Normal appearance. She is normal weight.  HENT:     Head: Normocephalic and atraumatic.     Mouth/Throat:     Mouth: Mucous membranes are moist.     Pharynx: Oropharynx is clear.  Eyes:     Extraocular Movements: Extraocular movements intact.     Conjunctiva/sclera: Conjunctivae normal.     Pupils: Pupils are equal, round, and reactive to light.  Cardiovascular:     Rate and Rhythm: Normal rate and regular rhythm.     Pulses: Normal pulses.     Heart sounds: Normal heart sounds.  Pulmonary:     Effort:  Pulmonary effort is normal.     Breath sounds: Normal breath sounds.  No wheezing, rhonchi or rales.  Musculoskeletal:        General: Normal range of motion.     Cervical back: Normal range of motion and neck supple.  Skin:    General: Skin is warm and dry.  Neurological:     General: No focal deficit present.     Mental Status: She is alert and oriented to person, place, and time. Mental status is at baseline.  Psychiatric:        Mood and Affect: Mood normal.        Behavior: Behavior normal.      UC Treatments / Results  Labs (all labs ordered are listed, but only abnormal results are displayed) Labs Reviewed  POCT URINALYSIS DIP (MANUAL ENTRY) - Abnormal; Notable for the following components:      Result Value   Clarity, UA cloudy (*)    Spec Grav, UA >=1.030 (*)    Blood, UA trace-intact (*)    Protein Ur, POC =30 (*)    Nitrite, UA Positive (*)    Leukocytes, UA Small (1+) (*)    All other components within normal limits  URINE CULTURE    EKG   Radiology No results found.  Procedures Procedures (including critical care time)  Medications Ordered in UC Medications - No data to display  Initial Impression / Assessment and Plan / UC Course  I have reviewed the triage vital signs and the nursing notes.  Pertinent labs & imaging results that were available during my care of the patient were reviewed by me and considered in my medical decision making (see chart for details).     MDM: 1.  Acute cystitis with hematuria-Rx'd Bactrim DS 800/160 mg tablet: Take 1 tablet by mouth twice daily x 7 days; 2.  Dysuria-UA revealed above, urine culture ordered, Rx'd Bactrim DS 800/160 mg tablet: Take 1 tablet by mouth twice daily x 7 days. Advised patient/husband take medication as directed with food to completion.  Encouraged to increase daily water intake to 64 ounces per day while taking this medication.  Advised we will follow-up with urine culture results once received.   Advised if symptoms worsen and/or unresolved please follow-up with PCP or here for further evaluation.  Final Clinical Impressions(s) / UC Diagnoses   Final diagnoses:  Acute cystitis with hematuria  Dysuria     Discharge Instructions      Advised patient/husband take medication as directed with food to completion.  Encouraged to increase daily water intake to 64 ounces per day while taking this medication.  Advised we will follow-up with urine culture results once received.  Advised if symptoms worsen and/or unresolved please follow-up with PCP or here for further evaluation.     ED Prescriptions     Medication Sig Dispense Auth. Provider   sulfamethoxazole-trimethoprim (BACTRIM DS) 800-160 MG tablet Take 1 tablet by mouth 2 (two) times daily for 7 days. 14 tablet Trevor Iha, FNP   sulfamethoxazole-trimethoprim (BACTRIM DS) 800-160 MG tablet Take 1 tablet by mouth 2 (two) times daily for 7 days. 14 tablet Trevor Iha, FNP      PDMP not reviewed this encounter.   Trevor Iha, FNP 04/25/23 1651

## 2023-04-25 NOTE — Discharge Instructions (Addendum)
Advised patient/husband take medication as directed with food to completion.  Encouraged to increase daily water intake to 64 ounces per day while taking this medication.  Advised we will follow-up with urine culture results once received.  Advised if symptoms worsen and/or unresolved please follow-up with PCP or here for further evaluation.

## 2023-04-25 NOTE — ED Triage Notes (Signed)
Malodorous urine x 1 week. Patient self caths.

## 2023-04-28 LAB — URINE CULTURE: Culture: >=100000 — AB

## 2023-08-02 ENCOUNTER — Ambulatory Visit
Admission: EM | Admit: 2023-08-02 | Discharge: 2023-08-02 | Disposition: A | Discharge status: Home / Self Care | Payer: Medicare Other | Attending: Family Medicine | Admitting: Family Medicine

## 2023-08-02 ENCOUNTER — Other Ambulatory Visit: Payer: Self-pay

## 2023-08-02 DIAGNOSIS — N3090 Cystitis, unspecified without hematuria: Secondary | ICD-10-CM | POA: Diagnosis not present

## 2023-08-02 DIAGNOSIS — R300 Dysuria: Secondary | ICD-10-CM | POA: Diagnosis not present

## 2023-08-02 DIAGNOSIS — B9689 Other specified bacterial agents as the cause of diseases classified elsewhere: Secondary | ICD-10-CM | POA: Insufficient documentation

## 2023-08-02 HISTORY — DX: Unspecified urinary incontinence: R32

## 2023-08-02 LAB — POCT URINALYSIS DIP (MANUAL ENTRY)
Bilirubin, UA: NEGATIVE
Blood, UA: NEGATIVE
Glucose, UA: NEGATIVE mg/dL
Ketones, POC UA: NEGATIVE mg/dL
Nitrite, UA: POSITIVE — AB
Protein Ur, POC: NEGATIVE mg/dL
Spec Grav, UA: >=1.03 — AB (ref 1.010–1.025)
Urobilinogen, UA: 0.2 U/dL
pH, UA: 6.5 (ref 5.0–8.0)

## 2023-08-02 MED ORDER — SULFAMETHOXAZOLE-TRIMETHOPRIM 800-160 MG PO TABS: 1.0000 | ORAL_TABLET | Freq: Two times a day (BID) | ORAL | 0 refills | Status: DC

## 2023-08-02 NOTE — ED Provider Notes (Signed)
[facility]    CSN: [id number] Arrival date & time: 08/02/23  1819      History   Chief Complaint No chief complaint on file.   HPI Nancy Beard is a 68 y.o. female.   Patient self caths and complains of three day history of dysuria and frequency, without pelvic/abdominal pain, flank pain, nausea/vomiting, and fevers, chills, or sweats. Review of records reveals a urine culture done 01/25/23 positive for e. Colii sensitive to Bactrim.  The history is provided by the patient and the spouse.  Dysuria Pain quality:  Burning Pain severity:  Mild Onset quality:  Sudden Duration:  3 days Timing:  Constant Progression:  Unchanged Chronicity:  Recurrent Recent urinary tract infections: no   Relieved by:  Nothing Worsened by:  Nothing Ineffective treatments:  None tried Urinary symptoms: foul-smelling urine, frequent urination and incontinence   Urinary symptoms: no discolored urine and no hematuria   Associated symptoms: no abdominal pain, no fever, no flank pain and no nausea   Risk factors: recurrent urinary tract infections and urinary catheter use     Past Medical History:  Diagnosis Date   Anxiety    Blood transfusion without reported diagnosis 2014   Breast cancer (HCC) 2014   right, bil mast   Celiac disease    Depression    DIVERTICULOSIS, COLON 05/05/2007   DVT (deep venous thrombosis) (HCC) 2014   Right lower extremity, thigh   DVT (deep venous thrombosis) (HCC) 06/30/2013   August 2014. Continued Xarelto due to persistent chronic DVT R popliteal vein. Plan repeat per oncology March 2016    Fracture of left tibial plateau 06/15/2015   Fracture tibia/fibula 06/14/2015   HEMORRHOIDS, EXTERNAL 05/05/2007   History of chemotherapy    Hypothyroidism    Incontinence    Neuropathy    of fingers and toes   Pneumonia    hx of years ago   Thyroid disease    Vitamin D insufficiency 06/20/2015   Wears glasses     Patient Active Problem List    Diagnosis Date Noted   Dysuria 10/05/2022   Recurrent major depressive disorder, in partial remission (HCC) 10/05/2022   Pain of left lower extremity 05/17/2022   Chills 03/12/2022   Elevated blood pressure reading 02/06/2022   Acute cystitis 12/05/2021   Closed avulsion fracture of distal fibula, left, initial encounter 11/18/2020   L1 burst fracture with instrumentation and spinal cord compression 11/18/2020   Acute deep vein thrombosis (DVT) of proximal vein of right lower extremity (HCC) see [place] 10/20/20- Xarelto started 10/20/20 10/20/2020   Screening for HIV (human immunodeficiency virus) 04/28/2020   Need for vaccination with 13-polyvalent pneumococcal conjugate vaccine 04/28/2020   Need for influenza vaccination 04/28/2020   Screening for colon cancer 04/28/2020   Elevated liver enzymes 11/12/2018   Prediabetes 11/12/2018   History of high cholesterol 11/12/2018   Herpes simplex 04/12/2017   Abnormal transaminases 06/27/2015   Iron deficiency 06/24/2015   Vitamin D deficiency 06/20/2015   Genital herpes 09/06/2014   Neuropathy 09/06/2014   Anticoagulant long-term use 05/01/2014   Persistent headaches 02/22/2014   Hot flashes related to aromatase inhibitor therapy 12/10/2013   Vulvar intraepithelial neoplasia III (VIN III) 06/30/2013   Status post bilateral breast reconstruction 06/30/2013   Osteoporosis 05/13/2013   Acquired absence of bilateral breasts and nipples 09/23/2012   Primary cancer of lower-inner quadrant of right female breast (HCC) 06/24/2012   Hyperlipidemia 05/13/2008   Hypothyroidism 01/31/2007   Depression 01/31/2007  Celiac disease/sprue 01/31/2007    Past Surgical History:  Procedure Laterality Date   AXILLARY SENTINEL NODE BIOPSY Right 09/11/2012   Procedure: AXILLARY SENTINEL lymph NODE  BIOPSY;  Surgeon: Adolph Pollack, MD;  Location: [facility] OR;  Service: General;  Laterality: Right;  right nuclear medicine injection 12:30    BREAST LUMPECTOMY      benign lump   BREAST RECONSTRUCTION Right 09/24/2013   Procedure: REVISION OF RIGHT BREAST RECONSTRUCTION WITH REPOSITIONING RIGHT IMPLANT, POSSIBLE EXCISION CAPSULAR CONTRACTURE AND LIPOFILLING FOR FAT GRAFTING;  Surgeon: Wayland Denis, DO;  Location: [facility];  Service: Plastics;  Laterality: Right;   BREAST RECONSTRUCTION WITH PLACEMENT OF TISSUE EXPANDER AND FLEX HD (ACELLULAR HYDRATED DERMIS) Bilateral 09/11/2012   Procedure: BREAST RECONSTRUCTION WITH PLACEMENT OF TISSUE EXPANDER AND FLEX HD (ACELLULAR HYDRATED DERMIS) ADM;  Surgeon: Wayland Denis, DO;  Location: [facility] OR;  Service: Plastics;  Laterality: Bilateral;   COLONOSCOPY  12/20/2004   Leone Payor   cryoblation of cervix     long time ago   ESOPHAGOGASTRODUODENOSCOPY  2007   sprue   EXTERNAL FIXATION LEG Left 06/15/2015   Procedure: EXTERNAL FIXATION LEG;  Surgeon: Myrene Galas, MD;  Location: [facility] OR;  Service: Orthopedics;  Laterality: Left;   INCISION AND DRAINAGE OF WOUND Left 09/16/2012   Procedure: Left Breast Evacuation of Hematoma;  Surgeon: Wayland Denis, DO;  Location: [facility] OR;  Service: Plastics;  Laterality: Left;   LIPOSUCTION WITH LIPOFILLING Bilateral 09/24/2013   Procedure: LIPOSUCTION WITH LIPOFILLING;  Surgeon: Wayland Denis, DO;  Location:  [facility];  Service: Plastics;  Laterality: Bilateral;  Biltalteal filling breast   LIPOSUCTION WITH LIPOFILLING Bilateral 06/08/2020   Procedure: LIPOSUCTION WITH LIPOFILLING;  Surgeon: Peggye Form, DO;  Location: [facility];  Service: Plastics;  Laterality: Bilateral;  90 min, please   ORIF TIBIA PLATEAU Left 06/21/2015   Procedure: OPEN REDUCTION INTERNAL FIXATION (ORIF) TIBIAL PLATEAU REMOVAL OF EXTERNAL FISTULA;  Surgeon: Myrene Galas, MD;  Location: [facility] OR;  Service: Orthopedics;  Laterality: Left;   PORT-A-CATH REMOVAL Right 08/07/2013   Procedure: REMOVAL PORT-A-CATH;  Surgeon: Adolph Pollack, MD;  Location: [facility];  Service: General;  Laterality: Right;   PORTACATH PLACEMENT Right 10/09/2012   Procedure: US GUIDED INSERTION PORT-A-CATH;  Surgeon: Adolph Pollack, MD;  Location: [facility];  Service: General;  Laterality: Right;  Right Subclavian Vein   REMOVAL OF BILATERAL TISSUE EXPANDERS WITH PLACEMENT OF BILATERAL BREAST IMPLANTS Bilateral 06/24/2013   Procedure: REMOVAL OF BILATERAL TISSUE EXPANDERS WITH PLACEMENT OF BILATERAL BREAST IMPLANTS;  Surgeon: Wayland Denis, DO;  Location:  SURGERY CENTER;  Service: Plastics;  Laterality: Bilateral;   TOTAL MASTECTOMY Bilateral 09/11/2012   Procedure: bilateral MASTECTOMY;  Surgeon: Adolph Pollack, MD;  Location: [facility] OR;  Service: General;  Laterality: Bilateral;   VULVECTOMY N/A 07/14/2013   Procedure: WIDE LOCAL  EXCISION VULVAR;  Surgeon: Jeannette Corpus, MD;  Location: [facility];  Service: Gynecology;  Laterality: N/A;    OB History   No obstetric history on file.      Home Medications    Prior to Admission medications   Medication Sig Start Date End Date Taking? Authorizing Provider  sulfamethoxazole-trimethoprim (BACTRIM DS) 800-160 MG tablet Take 1 tablet by mouth 2 (two) times daily. 08/02/23  Yes Lattie Haw, MD  acetaminophen (TYLENOL) 325 MG tablet Take by mouth. 09/28/20   [provider]  AMBULATORY NON FORMULARY MEDICATION BLOOD PRESSURE MONITOR -  ARM CUFF PER PATIENT PREFERENCE / INSURANCE COVERAGE, DX: ELEVATED BLOOD PRESSURE 02/22/20   Sunnie Nielsen, DO  atorvastatin (LIPITOR) 10 MG tablet TAKE 1 TABLET BY MOUTH EVERYDAY AT BEDTIME 02/25/23   Morey Hummingbird S, DO  celecoxib (CELEBREX) 200 MG capsule Take 200 mg by mouth 2 (two) times daily.    [provider]  Cholecalciferol (VITAMIN D-3 PO) Take 1 tablet by mouth daily.    [provider]  levothyroxine (SYNTHROID) 112 MCG tablet TAKE 1 TABLET BY MOUTH DAILY BEFORE BREAKFAST. 01/07/23   Morey Hummingbird S, DO  oxyCODONE  (OXY IR/ROXICODONE) 5 MG immediate release tablet Take 5 mg by mouth every 4 (four) hours as needed. 02/11/22   [provider]  pregabalin (LYRICA) 100 MG capsule Take 100 mg by mouth 3 (three) times daily as needed. 12/01/21   [provider]  silver sulfADIAZINE (SILVADENE) 1 % cream Apply 1 Application topically daily. 09/01/22   Eustace Moore, MD  venlafaxine XR (EFFEXOR-XR) 150 MG 24 hr capsule TAKE 1 CAPSULE BY MOUTH DAILY WITH BREAKFAST 04/10/23   Charlton Amor, DO    Family History Family History  Problem Relation Age of Onset   Breast cancer Paternal Aunt        dx in her 74s   Lymphoma Paternal Uncle    Breast cancer Paternal Grandmother        dx >50   Heart attack Paternal Grandfather    Breast cancer Other        2 maternal great aunts with breast cancer >50   Osteoporosis Mother        controlled with diet/exercise   COPD Father    Colon cancer Neg Hx    Colon polyps Neg Hx    Esophageal cancer Neg Hx    Rectal cancer Neg Hx    Stomach cancer Neg Hx     Social History Social History   Tobacco Use   Smoking status: Never   Smokeless tobacco: Never  Vaping Use   Vaping status: Never Used  Substance Use Topics   Alcohol use: No   Drug use: No     Allergies   Ciprofloxacin, Hydromorphone hcl, Nitrofurantoin, Tizanidine, Gluten meal, and Melatonin   Review of Systems Review of Systems  Constitutional:  Negative for activity change, appetite change, chills, diaphoresis, fatigue and fever.  Gastrointestinal:  Negative for abdominal pain and nausea.  Genitourinary:  Positive for dysuria and frequency. Negative for flank pain and pelvic pain.  All other systems reviewed and are negative.    Physical Exam Triage Vital Signs ED Triage Vitals  Encounter Vitals Group     BP 08/02/23 1908 (!) 149/93     Systolic BP Percentile --      Diastolic BP Percentile --      Pulse Rate 08/02/23 1908 99     Resp 08/02/23 1908 16     Temp  08/02/23 1908 98.2 F (36.8 C)     Temp Source 08/02/23 1908 Oral     SpO2 08/02/23 1908 98 %     Weight --      Height --      Head Circumference --      Peak Flow --      Pain Score 08/02/23 1909 5     Pain Loc --      Pain Education --      Exclude from Growth Chart --    No data found.  Updated Vital Signs  BP (!) 149/93   Pulse 99   Temp 98.2 F (36.8 C) (Oral)   Resp 16   LMP 09/11/2012   SpO2 98%   Visual Acuity Right Eye Distance:   Left Eye Distance:   Bilateral Distance:    Right Eye Near:   Left Eye Near:    Bilateral Near:     Physical Exam Nursing notes and Vital Signs reviewed. Appearance:  Patient appears stated age, and in no acute distress.    Eyes:  Pupils are equal, round, and reactive to light and accomodation.  Extraocular movement is intact.  Conjunctivae are not inflamed   Pharynx:  Normal; moist mucous membranes  Neck:  Supple.  No adenopathy Lungs:  Clear to auscultation.  Breath sounds are equal.  Moving air well. Heart:  Regular rate and rhythm without murmurs, rubs, or gallops.  Abdomen:  Nontender without masses or hepatosplenomegaly.  Bowel sounds are present.  No CVA or flank tenderness.  Extremities:  No edema.  Skin:  No rash present.     UC Treatments / Results  Labs (all labs ordered are listed, but only abnormal results are displayed) Labs Reviewed  POCT URINALYSIS DIP (MANUAL ENTRY) - Abnormal; Notable for the following components:      Result Value   Spec Grav, UA >=1.030 (*)    Nitrite, UA Positive (*)    Leukocytes, UA Small (1+) (*)    All other components within normal limits  URINE CULTURE    EKG   Radiology No results found.  Procedures Procedures (including critical care time)  Medications Ordered in UC Medications - No data to display  Initial Impression / Assessment and Plan / UC Course  I have reviewed the triage vital signs and the nursing notes.  Pertinent labs & imaging results that were  available during my care of the patient were reviewed by me and considered in my medical decision making (see chart for details).    Urine culture pending.  Begin Bactrim DS one BID for one week. Followup with Family Doctor if not improved in about 5 days.  Final Clinical Impressions(s) / UC Diagnoses   Final diagnoses:  Dysuria  Cystitis     Discharge Instructions      Increase fluid intake.  If symptoms become significantly worse during the night or over the weekend, proceed to the local emergency room.     ED Prescriptions     Medication Sig Dispense Auth. Provider   sulfamethoxazole-trimethoprim (BACTRIM DS) 800-160 MG tablet Take 1 tablet by mouth 2 (two) times daily. 14 tablet Lattie Haw, MD         Lattie Haw, MD 08/03/23 [phone]

## 2023-08-02 NOTE — ED Triage Notes (Addendum)
X 3 days has had burning urination, frequent urination. Is incontinent. No fever. Self caths. Wants paper prescription to price compare pharmacies.

## 2023-08-02 NOTE — Discharge Instructions (Signed)
Increase fluid intake. °If symptoms become significantly worse during the night or over the weekend, proceed to the local emergency room.  °

## 2023-08-05 LAB — URINE CULTURE: Culture: 20000 — AB

## 2023-09-23 ENCOUNTER — Ambulatory Visit
Admission: EM | Admit: 2023-09-23 | Discharge: 2023-09-23 | Disposition: A | Discharge status: Home / Self Care | Attending: Internal Medicine | Admitting: Internal Medicine

## 2023-09-23 ENCOUNTER — Encounter: Payer: Self-pay | Admitting: Emergency Medicine

## 2023-09-23 DIAGNOSIS — R300 Dysuria: Secondary | ICD-10-CM

## 2023-09-23 DIAGNOSIS — R3989 Other symptoms and signs involving the genitourinary system: Secondary | ICD-10-CM | POA: Diagnosis not present

## 2023-09-23 DIAGNOSIS — Z789 Other specified health status: Secondary | ICD-10-CM

## 2023-09-23 DIAGNOSIS — R39198 Other difficulties with micturition: Secondary | ICD-10-CM | POA: Insufficient documentation

## 2023-09-23 DIAGNOSIS — N390 Urinary tract infection, site not specified: Secondary | ICD-10-CM | POA: Diagnosis present

## 2023-09-23 DIAGNOSIS — R8290 Unspecified abnormal findings in urine: Secondary | ICD-10-CM | POA: Diagnosis not present

## 2023-09-23 LAB — POCT URINALYSIS DIP (MANUAL ENTRY)
Bilirubin, UA: NEGATIVE
Blood, UA: NEGATIVE
Glucose, UA: NEGATIVE mg/dL
Ketones, POC UA: NEGATIVE mg/dL
Leukocytes, UA: NEGATIVE
Nitrite, UA: NEGATIVE
Spec Grav, UA: >=1.03 — AB
Urobilinogen, UA: 0.2 U/dL
pH, UA: 6

## 2023-09-23 MED ORDER — SULFAMETHOXAZOLE-TRIMETHOPRIM 800-160 MG PO TABS: 1.0000 | ORAL_TABLET | Freq: Two times a day (BID) | ORAL | 0 refills | Status: DC

## 2023-09-23 NOTE — ED Provider Notes (Signed)
 [facility]    CSN: [id number] Arrival date & time: 09/23/23  1702      History   Chief Complaint Chief Complaint  Patient presents with   urine odor    HPI Nancy Beard is a 68 y.o. female.   68 year old female who presents to urgent care with complaints of malodorous urine.  This is started within the last couple of days.  This is usually the first sign of urinary tract infection for her.  She self catheterizes her bladder and has had recurrent infections due to this.  She has learned that when she is for starting to smell the odor that this is likely ready to turn into a urinary tract infection.  She was last treated in January.  The culture at that time was sensitive for Bactrim.  She denies fevers or chills.  She is not having any lower abdominal pain.     Past Medical History:  Diagnosis Date   Anxiety    Blood transfusion without reported diagnosis 2014   Breast cancer (HCC) 2014   right, bil mast   Celiac disease    Depression    DIVERTICULOSIS, COLON 05/05/2007   DVT (deep venous thrombosis) (HCC) 2014   Right lower extremity, thigh   DVT (deep venous thrombosis) (HCC) 06/30/2013   August 2014. Continued Xarelto due to persistent chronic DVT R popliteal vein. Plan repeat per oncology March 2016    Fracture of left tibial plateau 06/15/2015   Fracture tibia/fibula 06/14/2015   HEMORRHOIDS, EXTERNAL 05/05/2007   History of chemotherapy    Hypothyroidism    Incontinence    Neuropathy    of fingers and toes   Pneumonia    hx of years ago   Thyroid disease    Vitamin D insufficiency 06/20/2015   Wears glasses     Patient Active Problem List   Diagnosis Date Noted   Dysuria 10/05/2022   Recurrent major depressive disorder, in partial remission (HCC) 10/05/2022   Pain of left lower extremity 05/17/2022   Chills 03/12/2022   Elevated blood pressure reading 02/06/2022   Acute cystitis 12/05/2021   Closed avulsion fracture of distal fibula,  left, initial encounter 11/18/2020   L1 burst fracture with instrumentation and spinal cord compression 11/18/2020   Acute deep vein thrombosis (DVT) of proximal vein of right lower extremity (HCC) see [place] 10/20/20- Xarelto started 10/20/20 10/20/2020   Screening for HIV (human immunodeficiency virus) 04/28/2020   Need for vaccination with 13-polyvalent pneumococcal conjugate vaccine 04/28/2020   Need for influenza vaccination 04/28/2020   Screening for colon cancer 04/28/2020   Elevated liver enzymes 11/12/2018   Prediabetes 11/12/2018   History of high cholesterol 11/12/2018   Herpes simplex 04/12/2017   Abnormal transaminases 06/27/2015   Iron deficiency 06/24/2015   Vitamin D deficiency 06/20/2015   Genital herpes 09/06/2014   Neuropathy 09/06/2014   Anticoagulant long-term use 05/01/2014   Persistent headaches 02/22/2014   Hot flashes related to aromatase inhibitor therapy 12/10/2013   Vulvar intraepithelial neoplasia III (VIN III) 06/30/2013   Status post bilateral breast reconstruction 06/30/2013   Osteoporosis 05/13/2013   Acquired absence of bilateral breasts and nipples 09/23/2012   Primary cancer of lower-inner quadrant of right female breast (HCC) 06/24/2012   Hyperlipidemia 05/13/2008   Hypothyroidism 01/31/2007   Depression 01/31/2007   Celiac disease/sprue 01/31/2007    Past Surgical History:  Procedure Laterality Date   AXILLARY SENTINEL NODE BIOPSY Right 09/11/2012   Procedure: AXILLARY SENTINEL lymph NODE  BIOPSY;  Surgeon: Adolph Pollack, MD;  Location: [facility] OR;  Service: General;  Laterality: Right;  right nuclear medicine injection 12:30    BREAST LUMPECTOMY     benign lump   BREAST RECONSTRUCTION Right 09/24/2013   Procedure: REVISION OF RIGHT BREAST RECONSTRUCTION WITH REPOSITIONING RIGHT IMPLANT, POSSIBLE EXCISION CAPSULAR CONTRACTURE AND LIPOFILLING FOR FAT GRAFTING;  Surgeon: Wayland Denis, DO;  Location: [facility];  Service: Plastics;   Laterality: Right;   BREAST RECONSTRUCTION WITH PLACEMENT OF TISSUE EXPANDER AND FLEX HD (ACELLULAR HYDRATED DERMIS) Bilateral 09/11/2012   Procedure: BREAST RECONSTRUCTION WITH PLACEMENT OF TISSUE EXPANDER AND FLEX HD (ACELLULAR HYDRATED DERMIS) ADM;  Surgeon: Wayland Denis, DO;  Location: [facility] OR;  Service: Plastics;  Laterality: Bilateral;   COLONOSCOPY  12/20/2004   Leone Payor   cryoblation of cervix     long time ago   ESOPHAGOGASTRODUODENOSCOPY  2007   sprue   EXTERNAL FIXATION LEG Left 06/15/2015   Procedure: EXTERNAL FIXATION LEG;  Surgeon: Myrene Galas, MD;  Location: [facility] OR;  Service: Orthopedics;  Laterality: Left;   INCISION AND DRAINAGE OF WOUND Left 09/16/2012   Procedure: Left Breast Evacuation of Hematoma;  Surgeon: Wayland Denis, DO;  Location: [facility] OR;  Service: Plastics;  Laterality: Left;   LIPOSUCTION WITH LIPOFILLING Bilateral 09/24/2013   Procedure: LIPOSUCTION WITH LIPOFILLING;  Surgeon: Wayland Denis, DO;  Location: [facility];  Service: Plastics;  Laterality: Bilateral;  Biltalteal filling breast   LIPOSUCTION WITH LIPOFILLING Bilateral 06/08/2020   Procedure: LIPOSUCTION WITH LIPOFILLING;  Surgeon: Peggye Form, DO;  Location: [facility];  Service: Plastics;  Laterality: Bilateral;  90 min, please   ORIF TIBIA PLATEAU Left 06/21/2015   Procedure: OPEN REDUCTION INTERNAL FIXATION (ORIF) TIBIAL PLATEAU REMOVAL OF EXTERNAL FISTULA;  Surgeon: Myrene Galas, MD;  Location: [facility] OR;  Service: Orthopedics;  Laterality: Left;   PORT-A-CATH REMOVAL Right 08/07/2013   Procedure: REMOVAL PORT-A-CATH;  Surgeon: Adolph Pollack, MD;  Location: [facility];  Service: General;  Laterality: Right;   PORTACATH PLACEMENT Right 10/09/2012   Procedure: US GUIDED INSERTION PORT-A-CATH;  Surgeon: Adolph Pollack, MD;  Location: [facility];  Service: General;  Laterality: Right;  Right Subclavian Vein   REMOVAL OF BILATERAL TISSUE  EXPANDERS WITH PLACEMENT OF BILATERAL BREAST IMPLANTS Bilateral 06/24/2013   Procedure: REMOVAL OF BILATERAL TISSUE EXPANDERS WITH PLACEMENT OF BILATERAL BREAST IMPLANTS;  Surgeon: Wayland Denis, DO;  Location: [facility];  Service: Plastics;  Laterality: Bilateral;   TOTAL MASTECTOMY Bilateral 09/11/2012   Procedure: bilateral MASTECTOMY;  Surgeon: Adolph Pollack, MD;  Location: [facility] OR;  Service: General;  Laterality: Bilateral;   VULVECTOMY N/A 07/14/2013   Procedure: WIDE LOCAL  EXCISION VULVAR;  Surgeon: Jeannette Corpus, MD;  Location: [facility];  Service: Gynecology;  Laterality: N/A;    OB History   No obstetric history on file.      Home Medications    Prior to Admission medications   Medication Sig Start Date End Date Taking? Authorizing Provider  acetaminophen (TYLENOL) 325 MG tablet Take by mouth. 09/28/20  Yes [provider]  AMBULATORY NON FORMULARY MEDICATION BLOOD PRESSURE MONITOR - ARM CUFF PER PATIENT PREFERENCE / INSURANCE COVERAGE, DX: ELEVATED BLOOD PRESSURE 02/22/20  Yes Sunnie Nielsen, DO  atorvastatin (LIPITOR) 10 MG tablet TAKE 1 TABLET BY MOUTH EVERYDAY AT BEDTIME 02/25/23  Yes Wachs, Erika S, DO  celecoxib (CELEBREX) 200 MG capsule Take 200 mg by mouth 2 (  two) times daily.   Yes [provider]  Cholecalciferol (VITAMIN D-3 PO) Take 1 tablet by mouth daily.   Yes [provider]  levothyroxine (SYNTHROID) 112 MCG tablet TAKE 1 TABLET BY MOUTH DAILY BEFORE BREAKFAST. 01/07/23  Yes Tamera Punt, Erika S, DO  oxyCODONE (OXY IR/ROXICODONE) 5 MG immediate release tablet Take 5 mg by mouth every 4 (four) hours as needed. 02/11/22  Yes [provider]  pregabalin (LYRICA) 100 MG capsule Take 100 mg by mouth 3 (three) times daily as needed. 12/01/21  Yes [provider]  silver sulfADIAZINE (SILVADENE) 1 % cream Apply 1 Application topically daily. 09/01/22  Yes Eustace Moore, MD  sulfamethoxazole-trimethoprim  (BACTRIM DS) 800-160 MG tablet Take 1 tablet by mouth 2 (two) times daily. 08/02/23  Yes Lattie Haw, MD  venlafaxine XR (EFFEXOR-XR) 150 MG 24 hr capsule TAKE 1 CAPSULE BY MOUTH DAILY WITH BREAKFAST 04/10/23  Yes Charlton Amor, DO    Family History Family History  Problem Relation Age of Onset   Breast cancer Paternal Aunt        dx in her 67s   Lymphoma Paternal Uncle    Breast cancer Paternal Grandmother        dx >50   Heart attack Paternal Grandfather    Breast cancer Other        2 maternal great aunts with breast cancer >50   Osteoporosis Mother        controlled with diet/exercise   COPD Father    Colon cancer Neg Hx    Colon polyps Neg Hx    Esophageal cancer Neg Hx    Rectal cancer Neg Hx    Stomach cancer Neg Hx     Social History Social History   Tobacco Use   Smoking status: Never   Smokeless tobacco: Never  Vaping Use   Vaping status: Never Used  Substance Use Topics   Alcohol use: No   Drug use: No     Allergies   Ciprofloxacin, Hydromorphone hcl, Nitrofurantoin, Tizanidine, Gluten meal, and Melatonin   Review of Systems Review of Systems  Constitutional:  Negative for chills and fever.  HENT:  Negative for ear pain and sore throat.   Eyes:  Negative for pain and visual disturbance.  Respiratory:  Negative for cough and shortness of breath.   Cardiovascular:  Negative for chest pain and palpitations.  Gastrointestinal:  Negative for abdominal pain and vomiting.  Genitourinary:  Negative for dysuria and hematuria.       Malodorous urine  Musculoskeletal:  Negative for arthralgias and back pain.  Skin:  Negative for color change and rash.  Neurological:  Negative for seizures and syncope.  All other systems reviewed and are negative.    Physical Exam Triage Vital Signs ED Triage Vitals  Encounter Vitals Group     BP 09/23/23 1733 (!) 143/88     Systolic BP Percentile --      Diastolic BP Percentile --      Pulse Rate 09/23/23 1733 93      Resp 09/23/23 1733 16     Temp 09/23/23 1733 98.3 F (36.8 C)     Temp Source 09/23/23 1733 Oral     SpO2 09/23/23 1733 94 %     Weight --      Height --      Head Circumference --      Peak Flow --      Pain Score 09/23/23 1731 0  Pain Loc --      Pain Education --      Exclude from Growth Chart --    No data found.  Updated Vital Signs BP (!) 143/88 (BP Location: Left Arm)   Pulse 93   Temp 98.3 F (36.8 C) (Oral)   Resp 16   LMP 09/11/2012   SpO2 94%   Visual Acuity Right Eye Distance:   Left Eye Distance:   Bilateral Distance:    Right Eye Near:   Left Eye Near:    Bilateral Near:     Physical Exam Vitals and nursing note reviewed.  Constitutional:      General: She is not in acute distress.    Appearance: She is well-developed.  HENT:     Head: Normocephalic and atraumatic.  Eyes:     Conjunctiva/sclera: Conjunctivae normal.  Cardiovascular:     Rate and Rhythm: Normal rate and regular rhythm.     Heart sounds: No murmur heard. Pulmonary:     Effort: Pulmonary effort is normal. No respiratory distress.     Breath sounds: Normal breath sounds.  Abdominal:     Palpations: Abdomen is soft.     Tenderness: There is no abdominal tenderness.  Musculoskeletal:        General: No swelling.     Cervical back: Neck supple.  Skin:    General: Skin is warm and dry.     Capillary Refill: Capillary refill takes less than 2 seconds.  Neurological:     Mental Status: She is alert.  Psychiatric:        Mood and Affect: Mood normal.      UC Treatments / Results  Labs (all labs ordered are listed, but only abnormal results are displayed) Labs Reviewed  POCT URINALYSIS DIP (MANUAL ENTRY) - Abnormal; Notable for the following components:      Result Value   Spec Grav, UA >=1.030 (*)    Protein Ur, POC trace (*)    All other components within normal limits    EKG   Radiology No results found.  Procedures Procedures (including critical care  time)  Medications Ordered in UC Medications - No data to display  Initial Impression / Assessment and Plan / UC Course  I have reviewed the triage vital signs and the nursing notes.  Pertinent labs & imaging results that were available during my care of the patient were reviewed by me and considered in my medical decision making (see chart for details).     Malodorous urine  Self-catheterizes urinary bladder   Urinalysis done today which was only positive for protein but given the odor and the recurrent urinary tract infections, we will send this for urine culture and treat with the following: Bactrim DS 1 tablet by mouth twice daily for 7 days Stay hydrated and drink plenty of water Return to urgent care or PCP if symptoms worsen or fail to resolve.    Final Clinical Impressions(s) / UC Diagnoses   Final diagnoses:  Malodorous urine  Self-catheterizes urinary bladder     Discharge Instructions      Urinalysis done today which was only positive for protein but given the odor and the recurrent urinary tract infections, we will send this for urine culture and treat with the following: Bactrim DS 1 tablet by mouth twice daily for 7 days Stay hydrated and drink plenty of water Return to urgent care or PCP if symptoms worsen or fail to resolve.     ED  Prescriptions   None    PDMP not reviewed this encounter.   Landis Martins, [place] 09/23/23 [phone]

## 2023-09-23 NOTE — ED Triage Notes (Signed)
 Patient presents to Urgent Care with complaints of urine odor since progressive worsening. Patient reports she watches the urine over time. She does self cath and notice changes of urine.

## 2023-09-23 NOTE — Discharge Instructions (Addendum)
 Urinalysis done today which was only positive for protein but given the odor and the recurrent urinary tract infections, we will send this for urine culture and treat with the following: Bactrim DS 1 tablet by mouth twice daily for 7 days Stay hydrated and drink plenty of water Return to urgent care or PCP if symptoms worsen or fail to resolve.

## 2023-09-25 LAB — URINE CULTURE: Culture: NO GROWTH

## 2023-10-03 ENCOUNTER — Ambulatory Visit: Payer: Self-pay | Admitting: Family Medicine

## 2023-10-03 NOTE — Telephone Encounter (Signed)
  Chief Complaint: nausea Symptoms: nausea  Disposition: [] ED /[] Urgent Care (no appt availability in office) / [x] Appointment(In office/virtual)/ []  [facility]/ [] Home Care/ [] Refused Recommended Disposition /[] [facility]/ []  Follow-up with PCP Additional Notes: Pt has chronic nausea and Pt is requesting the medication Prochlorperazine 5mg . Pt was on this medication in 2021 when going through cancer treatment. Pt feels this works best for her. Pt took last pill from 2021 last week and likes to keep it on hand. Pt is not currently nauseated. Pt has appt tomorrow at 1600 with NP [place]. RN gave care advice and pt verbalized understanding.               Reason for Triage: Patient states she would like Prochlorperazine 5mg  sent in to the the pharmacy for Nausea. She was prescribed this medication in 2021 and said it works best for her.   Reason for Disposition  Nausea is a chronic symptom (recurrent or ongoing AND present > 4 weeks)  Answer Assessment - Initial Assessment Questions 1. NAUSEA SEVERITY: "How bad is the nausea?" (e.g., mild, moderate, severe; dehydration, weight loss)   - MILD: loss of appetite without change in eating habits   - MODERATE: decreased oral intake without significant weight loss, dehydration, or malnutrition   - SEVERE: inadequate caloric or fluid intake, significant weight loss, symptoms of dehydration     Mild  2. ONSET: "When did the nausea begin?"     2021 3. VOMITING: "Any vomiting?" If Yes, ask: "How many times today?"     No vomiting 4. RECURRENT SYMPTOM: "Have you had nausea before?" If Yes, ask: "When was the last time?" "What happened that time?"     Yes during cancer tx  5. CAUSE: "What do you think is causing the nausea?"     Not sure  Protocols used: Nausea-A-AH

## 2023-10-04 ENCOUNTER — Ambulatory Visit (INDEPENDENT_AMBULATORY_CARE_PROVIDER_SITE_OTHER): Admitting: Medical-Surgical

## 2023-10-04 ENCOUNTER — Encounter: Payer: Self-pay | Admitting: Medical-Surgical

## 2023-10-04 VITALS — BP 127/73 | HR 98 | Resp 20 | Ht 66.0 in | Wt 166.1 lb

## 2023-10-04 DIAGNOSIS — R399 Unspecified symptoms and signs involving the genitourinary system: Secondary | ICD-10-CM

## 2023-10-04 DIAGNOSIS — R110 Nausea: Secondary | ICD-10-CM | POA: Diagnosis not present

## 2023-10-04 DIAGNOSIS — E611 Iron deficiency: Secondary | ICD-10-CM

## 2023-10-04 DIAGNOSIS — E785 Hyperlipidemia, unspecified: Secondary | ICD-10-CM | POA: Diagnosis not present

## 2023-10-04 DIAGNOSIS — E034 Atrophy of thyroid (acquired): Secondary | ICD-10-CM | POA: Diagnosis not present

## 2023-10-04 DIAGNOSIS — R7303 Prediabetes: Secondary | ICD-10-CM

## 2023-10-04 DIAGNOSIS — R030 Elevated blood-pressure reading, without diagnosis of hypertension: Secondary | ICD-10-CM

## 2023-10-04 DIAGNOSIS — E559 Vitamin D deficiency, unspecified: Secondary | ICD-10-CM

## 2023-10-04 MED ORDER — PROCHLORPERAZINE MALEATE 5 MG PO TABS
5.0000 mg | ORAL_TABLET | Freq: Four times a day (QID) | ORAL | 1 refills | Status: AC | PRN
Start: 2023-10-04 — End: ?

## 2023-10-04 NOTE — Progress Notes (Signed)
        Established patient visit  History, exam, impression, and plan:  1. Nausea (Primary) Pleasant 68 year old female accompanied by her husband presenting today to discuss nausea. She has a very complicated health history over the last several years. She is managed for chronic pain by the pain clinic. They have placed her on Percocet every 6 hours as needed. She does well on the medication overall but it causes nausea. Has tried other antiemetics but they were not tolerated. Used Compazine in the past which worked very well. Last prescription for this was in 2021. Requesting to refill this. Sending Compazine 5-10mg  twice every 6 hours as needed for nausea.   2. Hypothyroidism due to acquired atrophy of thyroid 3. Hyperlipidemia, unspecified hyperlipidemia type 4. Iron deficiency 5. Prediabetes 6. Vitamin D deficiency 7. Elevated blood pressure reading Labs ordered for future lab drawn at her upcoming appointment for chronic disease management.  - TSH - CMP14+EGFR - Lipid panel - Iron, TIBC and Ferritin Panel - Hemoglobin A1c - VITAMIN D 25 Hydroxy (Vit-D Deficiency, Fractures) - CBC with Differential/Platelet  8. Urinary tract infection symptoms Plan to recheck UA when she returns for labs to verify clearance of her previous issues.  - Urinalysis, Routine w reflex microscopic   Procedures performed this visit: None.  Return if symptoms worsen or fail to improve.  __________________________________ Thayer Ohm, DNP, APRN, FNP-BC Primary Care and Sports Medicine [facility] [place]

## 2023-10-07 ENCOUNTER — Other Ambulatory Visit: Payer: Self-pay | Admitting: Family Medicine

## 2023-10-07 ENCOUNTER — Inpatient Hospital Stay
Admission: RE | Admit: 2023-10-07 | Discharge: 2023-10-07 | Disposition: A | Discharge status: Home / Self Care | Payer: Self-pay | Source: Ambulatory Visit | Attending: Neurosurgery | Admitting: Neurosurgery

## 2023-10-07 DIAGNOSIS — Z049 Encounter for examination and observation for unspecified reason: Secondary | ICD-10-CM

## 2023-10-07 NOTE — Progress Notes (Unsigned)
 Referring Physician:  Leana Gamer, MD [address] [place],  [place] [address]  Primary Physician:  Charlton Amor, DO  History of Present Illness: 10/07/2023 [name] is here today with a chief complaint of ***  allodynia and pain over the left hip and though, which may be due to trapped LCFN or sciatic nerve.   Duration: *** Location: *** Quality: *** Severity: ***  Precipitating: aggravated by *** Modifying factors: made better by *** Weakness: none Timing: *** Bowel/Bladder Dysfunction: none  Conservative measures:  Physical therapy: *** has not participated in PT Multimodal medical therapy including regular antiinflammatories: ***Tylenol, Celebrex, Oxycodone, Lyrica  Injections: no epidural steroid injections  Past Surgery: ***none  Adaria A Buttrey has ***no symptoms of cervical myelopathy.  The symptoms are causing a significant impact on the patient's life.   I have utilized the care everywhere function in epic to review the outside records available from external health systems.  Review of Systems:  A 10 point review of systems is negative, except for the pertinent positives and negatives detailed in the HPI.  Past Medical History: Past Medical History:  Diagnosis Date   Anxiety    Blood transfusion without reported diagnosis 2014   Breast cancer (HCC) 2014   right, bil mast   Celiac disease    Depression    DIVERTICULOSIS, COLON 05/05/2007   DVT (deep venous thrombosis) (HCC) 2014   Right lower extremity, thigh   DVT (deep venous thrombosis) (HCC) 06/30/2013   August 2014. Continued Xarelto due to persistent chronic DVT R popliteal vein. Plan repeat per oncology March 2016    Fracture of left tibial plateau 06/15/2015   Fracture tibia/fibula 06/14/2015   HEMORRHOIDS, EXTERNAL 05/05/2007   History of chemotherapy    Hypothyroidism    Incontinence    Neuropathy    of fingers and toes   Pneumonia    hx of years ago   Thyroid  disease    Vitamin D insufficiency 06/20/2015   Wears glasses     Past Surgical History: Past Surgical History:  Procedure Laterality Date   AXILLARY SENTINEL NODE BIOPSY Right 09/11/2012   Procedure: AXILLARY SENTINEL lymph NODE  BIOPSY;  Surgeon: Adolph Pollack, MD;  Location: [facility] OR;  Service: General;  Laterality: Right;  right nuclear medicine injection 12:30    BREAST LUMPECTOMY     benign lump   BREAST RECONSTRUCTION Right 09/24/2013   Procedure: REVISION OF RIGHT BREAST RECONSTRUCTION WITH REPOSITIONING RIGHT IMPLANT, POSSIBLE EXCISION CAPSULAR CONTRACTURE AND LIPOFILLING FOR FAT GRAFTING;  Surgeon: Wayland Denis, DO;  Location: [facility];  Service: Plastics;  Laterality: Right;   BREAST RECONSTRUCTION WITH PLACEMENT OF TISSUE EXPANDER AND FLEX HD (ACELLULAR HYDRATED DERMIS) Bilateral 09/11/2012   Procedure: BREAST RECONSTRUCTION WITH PLACEMENT OF TISSUE EXPANDER AND FLEX HD (ACELLULAR HYDRATED DERMIS) ADM;  Surgeon: Wayland Denis, DO;  Location: [facility] OR;  Service: Plastics;  Laterality: Bilateral;   COLONOSCOPY  12/20/2004   Leone Payor   cryoblation of cervix     long time ago   ESOPHAGOGASTRODUODENOSCOPY  2007   sprue   EXTERNAL FIXATION LEG Left 06/15/2015   Procedure: EXTERNAL FIXATION LEG;  Surgeon: Myrene Galas, MD;  Location: [facility] OR;  Service: Orthopedics;  Laterality: Left;   INCISION AND DRAINAGE OF WOUND Left 09/16/2012   Procedure: Left Breast Evacuation of Hematoma;  Surgeon: Wayland Denis, DO;  Location: [facility] OR;  Service: Plastics;  Laterality: Left;   LIPOSUCTION WITH LIPOFILLING Bilateral 09/24/2013  Procedure: LIPOSUCTION WITH LIPOFILLING;  Surgeon: Wayland Denis, DO;  Location: [facility];  Service: Plastics;  Laterality: Bilateral;  Biltalteal filling breast   LIPOSUCTION WITH LIPOFILLING Bilateral 06/08/2020   Procedure: LIPOSUCTION WITH LIPOFILLING;  Surgeon: Peggye Form, DO;  Location: [facility];  Service:  Plastics;  Laterality: Bilateral;  90 min, please   ORIF TIBIA PLATEAU Left 06/21/2015   Procedure: OPEN REDUCTION INTERNAL FIXATION (ORIF) TIBIAL PLATEAU REMOVAL OF EXTERNAL FISTULA;  Surgeon: Myrene Galas, MD;  Location: [facility] OR;  Service: Orthopedics;  Laterality: Left;   PORT-A-CATH REMOVAL Right 08/07/2013   Procedure: REMOVAL PORT-A-CATH;  Surgeon: Adolph Pollack, MD;  Location: [facility];  Service: General;  Laterality: Right;   PORTACATH PLACEMENT Right 10/09/2012   Procedure: US GUIDED INSERTION PORT-A-CATH;  Surgeon: Adolph Pollack, MD;  Location: [facility];  Service: General;  Laterality: Right;  Right Subclavian Vein   REMOVAL OF BILATERAL TISSUE EXPANDERS WITH PLACEMENT OF BILATERAL BREAST IMPLANTS Bilateral 06/24/2013   Procedure: REMOVAL OF BILATERAL TISSUE EXPANDERS WITH PLACEMENT OF BILATERAL BREAST IMPLANTS;  Surgeon: Wayland Denis, DO;  Location: [facility];  Service: Plastics;  Laterality: Bilateral;   TOTAL MASTECTOMY Bilateral 09/11/2012   Procedure: bilateral MASTECTOMY;  Surgeon: Adolph Pollack, MD;  Location: [facility] OR;  Service: General;  Laterality: Bilateral;   VULVECTOMY N/A 07/14/2013   Procedure: WIDE LOCAL  EXCISION VULVAR;  Surgeon: Jeannette Corpus, MD;  Location: [facility];  Service: Gynecology;  Laterality: N/A;    Allergies: Allergies as of 10/09/2023 - Review Complete 10/04/2023  Allergen Reaction Noted   Ciprofloxacin Hives, Itching, and Other (See Comments) 11/07/2012   Hydromorphone hcl Itching 06/16/2015   Nitrofurantoin Hives, Rash, and Other (See Comments) 07/01/2015   Tizanidine Other (See Comments) 05/03/2022   Gluten meal Other (See Comments) 09/05/2012   Tramadol Other (See Comments) 04/26/2023   Melatonin Other (See Comments) 04/05/2021    Medications:  Current Outpatient Medications:    acetaminophen (TYLENOL) 325 MG tablet, Take by mouth., Disp: , Rfl:    AMBULATORY NON FORMULARY  MEDICATION, BLOOD PRESSURE MONITOR - ARM CUFF PER PATIENT PREFERENCE / INSURANCE COVERAGE, DX: ELEVATED BLOOD PRESSURE, Disp: 1 Units, Rfl: 99   atorvastatin (LIPITOR) 10 MG tablet, TAKE 1 TABLET BY MOUTH EVERYDAY AT BEDTIME, Disp: 90 tablet, Rfl: 0   celecoxib (CELEBREX) 200 MG capsule, Take 200 mg by mouth 2 (two) times daily., Disp: , Rfl:    Cholecalciferol (VITAMIN D-3 PO), Take 1 tablet by mouth daily., Disp: , Rfl:    levothyroxine (SYNTHROID) 112 MCG tablet, TAKE 1 TABLET BY MOUTH DAILY BEFORE BREAKFAST., Disp: 90 tablet, Rfl: 3   oxyCODONE (OXY IR/ROXICODONE) 5 MG immediate release tablet, Take 5 mg by mouth every 4 (four) hours as needed., Disp: , Rfl:    pregabalin (LYRICA) 100 MG capsule, Take 100 mg by mouth 3 (three) times daily as needed., Disp: , Rfl:    prochlorperazine (COMPAZINE) 5 MG tablet, Take 1-2 tablets (5-10 mg total) by mouth every 6 (six) hours as needed for nausea or vomiting., Disp: 240 tablet, Rfl: 1   silver sulfADIAZINE (SILVADENE) 1 % cream, Apply 1 Application topically daily., Disp: 50 g, Rfl: 0   sulfamethoxazole-trimethoprim (BACTRIM DS) 800-160 MG tablet, Take 1 tablet by mouth 2 (two) times daily., Disp: 14 tablet, Rfl: 0   venlafaxine XR (EFFEXOR-XR) 150 MG 24 hr capsule, TAKE 1 CAPSULE BY MOUTH DAILY WITH BREAKFAST, Disp: 90 capsule, Rfl:  1  Social History: Social History   Tobacco Use   Smoking status: Never   Smokeless tobacco: Never  Vaping Use   Vaping status: Never Used  Substance Use Topics   Alcohol use: No   Drug use: No    Family Medical History: Family History  Problem Relation Age of Onset   Breast cancer Paternal Aunt        dx in her 51s   Lymphoma Paternal Uncle    Breast cancer Paternal Grandmother        dx >50   Heart attack Paternal Grandfather    Breast cancer Other        2 maternal great aunts with breast cancer >50   Osteoporosis Mother        controlled with diet/exercise   COPD Father    Colon cancer Neg Hx     Colon polyps Neg Hx    Esophageal cancer Neg Hx    Rectal cancer Neg Hx    Stomach cancer Neg Hx     Physical Examination: There were no vitals filed for this visit.  General: Patient is in no apparent distress. Attention to examination is appropriate.  Neck:   Supple.  Full range of motion.  Respiratory: Patient is breathing without any difficulty.   NEUROLOGICAL:     Awake, alert, oriented to person, place, and time.  Speech is clear and fluent.   Cranial Nerves: Pupils equal round and reactive to light.  Facial tone is symmetric.  Facial sensation is symmetric. Shoulder shrug is symmetric. Tongue protrusion is midline.    Strength: Side Biceps Triceps Deltoid Interossei Grip Wrist Ext. Wrist Flex.  R 5 5 5 5 5 5 5   L 5 5 5 5 5 5 5    Side Iliopsoas Quads Hamstring PF DF EHL  R 5 5 5 5 5 5   L 5 5 5 5 5 5    Reflexes are ***2+ and symmetric at the biceps, triceps, brachioradialis, patella and achilles.   Hoffman's is absent. Clonus is absent  Bilateral upper and lower extremity sensation is intact to light touch ***.     No evidence of dysmetria noted.  Gait is normal.    Imaging: *** I have personally reviewed the images and agree with the above interpretation.  Medical Decision Making/Assessment and Plan: Ms. Worm is a pleasant 67 y.o. female with ***  There are no diagnoses linked to this encounter.   Thank you for involving me in the care of this patient.    Lovenia Kim MD/MSCR Neurosurgery

## 2023-10-09 ENCOUNTER — Other Ambulatory Visit: Payer: Self-pay | Admitting: Family Medicine

## 2023-10-09 ENCOUNTER — Encounter: Payer: Self-pay | Admitting: Neurosurgery

## 2023-10-09 ENCOUNTER — Encounter: Payer: Self-pay | Admitting: Medical-Surgical

## 2023-10-09 ENCOUNTER — Ambulatory Visit (INDEPENDENT_AMBULATORY_CARE_PROVIDER_SITE_OTHER): Admitting: Neurosurgery

## 2023-10-09 VITALS — BP 122/74 | Ht 66.0 in | Wt 167.0 lb

## 2023-10-09 DIAGNOSIS — R159 Full incontinence of feces: Secondary | ICD-10-CM | POA: Diagnosis not present

## 2023-10-09 DIAGNOSIS — S32010S Wedge compression fracture of first lumbar vertebra, sequela: Secondary | ICD-10-CM

## 2023-10-09 DIAGNOSIS — G9581 Conus medullaris syndrome: Secondary | ICD-10-CM | POA: Diagnosis not present

## 2023-10-09 DIAGNOSIS — R32 Unspecified urinary incontinence: Secondary | ICD-10-CM

## 2023-10-09 DIAGNOSIS — M79605 Pain in left leg: Secondary | ICD-10-CM | POA: Diagnosis not present

## 2023-10-09 LAB — CBC WITH DIFFERENTIAL/PLATELET
Basophils Absolute: 0.1 10*3/uL (ref 0.0–0.2)
Basos: 1 %
EOS (ABSOLUTE): 0.3 10*3/uL (ref 0.0–0.4)
Eos: 5 %
Hematocrit: 42.9 % (ref 34.0–46.6)
Hemoglobin: 13.9 g/dL (ref 11.1–15.9)
Immature Grans (Abs): 0 10*3/uL (ref 0.0–0.1)
Immature Granulocytes: 0 %
Lymphocytes Absolute: 3.2 10*3/uL — ABNORMAL HIGH (ref 0.7–3.1)
Lymphs: 48 %
MCH: 28.3 pg (ref 26.6–33.0)
MCHC: 32.4 g/dL (ref 31.5–35.7)
MCV: 87 fL (ref 79–97)
Monocytes Absolute: 0.7 10*3/uL (ref 0.1–0.9)
Monocytes: 10 %
Neutrophils Absolute: 2.5 10*3/uL (ref 1.4–7.0)
Neutrophils: 36 %
Platelets: 298 10*3/uL (ref 150–450)
RBC: 4.92 x10E6/uL (ref 3.77–5.28)
RDW: 14.1 % (ref 11.7–15.4)
WBC: 6.8 10*3/uL (ref 3.4–10.8)

## 2023-10-09 LAB — IRON,TIBC AND FERRITIN PANEL
Ferritin: 34 ng/mL (ref 15–150)
Iron Saturation: 25 % (ref 15–55)
Iron: 82 ug/dL (ref 27–139)
Total Iron Binding Capacity: 327 ug/dL (ref 250–450)
UIBC: 245 ug/dL (ref 118–369)

## 2023-10-09 LAB — CMP14+EGFR
ALT: 57 IU/L — ABNORMAL HIGH (ref 0–32)
AST: 50 IU/L — ABNORMAL HIGH (ref 0–40)
Albumin: 4.4 g/dL (ref 3.9–4.9)
Alkaline Phosphatase: 178 IU/L — ABNORMAL HIGH (ref 44–121)
BUN/Creatinine Ratio: 16 (ref 12–28)
BUN: 11 mg/dL (ref 8–27)
Bilirubin Total: 0.4 mg/dL (ref 0.0–1.2)
CO2: 24 mmol/L (ref 20–29)
Calcium: 9.4 mg/dL (ref 8.7–10.3)
Chloride: 106 mmol/L (ref 96–106)
Creatinine, Ser: 0.68 mg/dL (ref 0.57–1.00)
Globulin, Total: 2.2 g/dL (ref 1.5–4.5)
Glucose: 91 mg/dL (ref 70–99)
Potassium: 4.5 mmol/L (ref 3.5–5.2)
Sodium: 145 mmol/L — ABNORMAL HIGH (ref 134–144)
Total Protein: 6.6 g/dL (ref 6.0–8.5)
eGFR: 95 mL/min/{1.73_m2} (ref >59)

## 2023-10-09 LAB — URINALYSIS, ROUTINE W REFLEX MICROSCOPIC
Bilirubin, UA: NEGATIVE
Glucose, UA: NEGATIVE
Ketones, UA: NEGATIVE
Leukocytes,UA: NEGATIVE
Nitrite, UA: NEGATIVE
Protein,UA: NEGATIVE
RBC, UA: NEGATIVE
Specific Gravity, UA: 1.01 (ref 1.005–1.030)
Urobilinogen, Ur: 0.2 mg/dL (ref 0.2–1.0)
pH, UA: 6.5 (ref 5.0–7.5)

## 2023-10-09 LAB — TSH: TSH: 14.2 u[IU]/mL — ABNORMAL HIGH (ref 0.450–4.500)

## 2023-10-09 LAB — LIPID PANEL
Chol/HDL Ratio: 4.6 ratio — ABNORMAL HIGH (ref 0.0–4.4)
Cholesterol, Total: 204 mg/dL — ABNORMAL HIGH (ref 100–199)
HDL: 44 mg/dL (ref >39)
LDL Chol Calc (NIH): 132 mg/dL — ABNORMAL HIGH (ref 0–99)
Triglycerides: 159 mg/dL — ABNORMAL HIGH (ref 0–149)
VLDL Cholesterol Cal: 28 mg/dL (ref 5–40)

## 2023-10-09 LAB — VITAMIN D 25 HYDROXY (VIT D DEFICIENCY, FRACTURES): Vit D, 25-Hydroxy: 18.1 ng/mL — ABNORMAL LOW (ref 30.0–100.0)

## 2023-10-09 LAB — HEMOGLOBIN A1C
Est. average glucose Bld gHb Est-mCnc: 123 mg/dL
Hgb A1c MFr Bld: 5.9 % — ABNORMAL HIGH (ref 4.8–5.6)

## 2023-10-10 ENCOUNTER — Inpatient Hospital Stay
Admission: RE | Admit: 2023-10-10 | Discharge: 2023-10-10 | Disposition: A | Discharge status: Home / Self Care | Payer: Self-pay | Source: Ambulatory Visit | Attending: Neurosurgery | Admitting: Neurosurgery

## 2023-10-10 ENCOUNTER — Encounter: Payer: Self-pay | Admitting: Neurology

## 2023-10-10 ENCOUNTER — Encounter: Payer: Self-pay | Admitting: Neurosurgery

## 2023-10-10 ENCOUNTER — Telehealth: Payer: Self-pay | Admitting: Neurosurgery

## 2023-10-10 ENCOUNTER — Other Ambulatory Visit: Payer: Self-pay | Admitting: Family Medicine

## 2023-10-10 DIAGNOSIS — Z049 Encounter for examination and observation for unspecified reason: Secondary | ICD-10-CM

## 2023-10-10 NOTE — Telephone Encounter (Signed)
 I spoke to patient and informed her that Dr. Katrinka Blazing reviewed the MRI's loaded from [facility] and he talked to the Neurologist and they know what they need with EMG and [place]. Patient voiced understanding.

## 2023-10-10 NOTE — Telephone Encounter (Signed)
 Patient and her spouse, Nancy Beard are calling to get clarification on what Dr. Michaelle Copas recommendation was for the next step. Nancy Beard states that they were under the impression that Dr. Katrinka Blazing was ordering MRI's for the patient and possibly an EMG and ultrasound. She has been scheduled for the EMG and ultrasound for 10/22/2023. Please advise.

## 2023-10-11 ENCOUNTER — Other Ambulatory Visit: Payer: Self-pay

## 2023-10-11 DIAGNOSIS — R202 Paresthesia of skin: Secondary | ICD-10-CM

## 2023-10-22 ENCOUNTER — Ambulatory Visit (INDEPENDENT_AMBULATORY_CARE_PROVIDER_SITE_OTHER): Admitting: Neurology

## 2023-10-22 ENCOUNTER — Other Ambulatory Visit: Payer: Self-pay

## 2023-10-22 DIAGNOSIS — R202 Paresthesia of skin: Secondary | ICD-10-CM | POA: Diagnosis not present

## 2023-10-22 DIAGNOSIS — M5417 Radiculopathy, lumbosacral region: Secondary | ICD-10-CM

## 2023-10-22 NOTE — Procedures (Signed)
  [facility]  [address] [place], [address]  [place], [place] [address] Tel: [phone] Fax: [phone] Test Date:  10/22/2023  Patient: Nancy Beard DOB: [birth year] Physician: Jacquelyne Balint, MD  Sex: Female Height: 5\' 6"  Ref Phys: Ernestine Mcmurray, MD  ID#: [id number]   Technician:    History: This is a 68 year old female with left lower limb pain.  Findings: High frequency (4.0-16.0 MHz) B-mode, nonvascular ultrasound of the left lower limb shows: Cross sectional area of the left sciatic (popliteal fossa), peroneal/fibular (popliteal fossa to fibular head), and tibial (popliteal fossa) nerves are within normal limits.  The left lateral femoral cutaneous nerve could not be visualized nor was Tinel's sign present.  No other obvious lesion involving the adjacent bone or tendon is identified. No definite vascular abnormalities.  Impression: This is a normal neuromuscular ultrasound of the left lower limb. Specifically, there is no ultrasonographic evidence of entrapment or other focal pathology along the studied course of the left sciatic nerve (popliteal fossa), peroneal/fibular nerve (popliteal fossa to fibular head), or tibial nerve (popliteal fossa).  _______________  Jacquelyne Balint, MD [facility]    Nerve Measurements   Site Area Mobility Vascularity Comment   mm Norm     Left Fibular  Fib head 8.7  < 17.8      Pop fossa 14.8  < 20.9      Left Sciatic  Distal Thigh 43.3  < 80.0      Left Tibial  Pop fossa 22.6  < 55.9       Ultrasound Images:

## 2023-10-22 NOTE — Procedures (Signed)
 [facility]  [address] [place], [address]  [place], [place] [address] Tel: [phone] Fax: [phone] Test Date:  10/22/2023  Patient: Nancy Beard DOB: 1956/03/09 Physician: Jacquelyne Balint, MD  Sex: Female Height: 5\' 6"  Ref Phys: Ernestine Mcmurray, MD  ID#: [id number]   Technician:    History: This is a 68 year old female with left lower limb pain.  NCV & EMG Findings: Extensive electrodiagnostic evaluation of the left lower limb shows: Left sural and superficial peroneal/fibular sensory responses are within normal limits. Left peroneal/fibular (EDB) motor nerve shows evidence of a normal anatomic variant, an accessory peroneal. When accounting for this, the left peroneal/fibular (EDB) motor response is within normal limits. Left tibial (AH) motor response is within normal limits. Left H reflex is absent. Chronic motor axon loss changes WITH accompanying active denervation changes are seen in the left medial head of gastrocnemius and short head of biceps femoris muscles. Chronic motor axon loss changes WITHOUT accompanying active denervation changes are seen in the left tibialis anterior, flexor digitorum longus, and gluteus medius muscles. Lumbosacral paraspinal muscles were deferred due to prior lumbosacral spine surgery.  Impression: This is an abnormal study. The findings are most consistent with the following: An active/ongoing intraspinal canal lesion (ie: motor radiculopathy) at the left S1 root or segment, moderate in degree electrically. The residuals of an old intraspinal canal lesion (ie: motor radiculopathy) at the left L5 root or segment, mild in degree electrically. No electrodiagnostic evidence of a large fiber sensorimotor neuropathy. No electrodiagnostic evidence of left tibial, peroneal/fibular, or sciatic mononeuropathy.    ___________________________ Jacquelyne Balint, MD    Nerve Conduction Studies Motor Nerve Results    Latency Amplitude F-Lat Segment  Distance CV Comment  Site (ms) Norm (mV) Norm (ms)  (cm) (m/s) Norm   Left Fibular (EDB) Motor  Lat mall 5.9 - 1.31 -        Ankle 5.2  < 6.0 *1.33  > 2.5        Bel fib head 12.0 - 1.74 -  Bel fib head-Ankle 29.5 43  > 40         Bel fib head-Lat mall 29.5 48 -   Pop fossa 13.7 - 1.74 -  Pop fossa-Bel fib head 10 59 -   Left Tibial (AH) Motor  Ankle 3.7  < 6.0 10.2  > 4.0        Knee 13.2 - 7.8 -  Knee-Ankle 42 44  > 40    Sensory Sites    Neg Peak Lat Amplitude (O-P) Segment Distance Velocity Comment  Site (ms) Norm (V) Norm  (cm) (ms)   Left Superficial Fibular Sensory  14 cm-Ankle 2.1  < 4.6 6  > 3 14 cm-Ankle 8    Left Sural Sensory  Calf-Lat mall 2.8  < 4.6 4  > 3 Calf-Lat mall 14     H-Reflex Results    M-Lat H Lat H Neg Amp H-M Lat  Site (ms) (ms) Norm (mV) (ms)  Left Tibial H-Reflex  Pop fossa 5.3 -  < 35.0 - -   Electromyography   Side Muscle Ins.Act Fibs Fasc Recrt Amp Dur Poly Activation Comment  Left Tib ant Nml Nml Nml *1- *1+ *1+ Nml Nml N/A  Left Gastroc MH Nml *2+ Nml *2- *1+ *1+ *1+ Nml N/A  Left FDL Nml Nml Nml *2- *1+ *1+ Nml Nml N/A  Left Rectus fem Nml Nml Nml Nml Nml Nml Nml Nml N/A  Left Biceps  fem SH Nml *1+ Nml *1- *1+ *1+ Nml Nml N/A  Left Gluteus med Nml Nml Nml *1- *1+ *1+ Nml Nml N/A      Waveforms:  Motor      Sensory      H-Reflex

## 2023-10-30 ENCOUNTER — Ambulatory Visit (INDEPENDENT_AMBULATORY_CARE_PROVIDER_SITE_OTHER): Admitting: Neurosurgery

## 2023-10-30 ENCOUNTER — Encounter: Payer: Self-pay | Admitting: Neurosurgery

## 2023-10-30 VITALS — BP 138/84 | Ht 66.0 in | Wt 167.0 lb

## 2023-10-30 DIAGNOSIS — M79662 Pain in left lower leg: Secondary | ICD-10-CM

## 2023-10-30 DIAGNOSIS — S32010S Wedge compression fracture of first lumbar vertebra, sequela: Secondary | ICD-10-CM

## 2023-10-30 DIAGNOSIS — N3946 Mixed incontinence: Secondary | ICD-10-CM | POA: Diagnosis not present

## 2023-10-30 DIAGNOSIS — G9581 Conus medullaris syndrome: Secondary | ICD-10-CM

## 2023-10-30 DIAGNOSIS — M79605 Pain in left leg: Secondary | ICD-10-CM

## 2023-10-30 NOTE — Progress Notes (Signed)
 Referring Physician:  No referring provider defined for this encounter.  Primary Physician:  Josepha Nickels, DO  History of Present Illness: 10/30/2023  Ms. Albarran is here today for follow-up after repeat EMG and further workup.  The team was able to reprogram her and troubleshoot her stimulator I was able to get some left-sided stimulation which has given her some relief.  They have also worked in a range to help her from getting overstimulated.  And have given her multiple programs for different scenarios.  She does state that her pain continues to migrate to different locations so that the stimulator is not perfectly covering her.  Conservative measures:  Physical therapy:  has not participated in PT Multimodal medical therapy including regular antiinflammatories: Tylenol , Celebrex, Oxycodone , Lyrica  Injections: no epidural steroid injections  Past Surgery: Dr Merle Starcher at [facility] 08/30/2020 Spinal repair from fracture 03/2022 Dr, Kirk Peper, Dr. Welton Hall removal of cartilage and freed the spinal fluid and widened the spinal canal.  The symptoms are causing a significant impact on the patient's life.   I have utilized the care everywhere function in epic to review the outside records available from external health systems.  Review of Systems:  A 10 point review of systems is negative, except for the pertinent positives and negatives detailed in the HPI.  Past Medical History: Past Medical History:  Diagnosis Date   Anxiety    Blood transfusion without reported diagnosis 2014   Breast cancer (HCC) 2014   right, bil mast   Celiac disease    Depression    DIVERTICULOSIS, COLON 05/05/2007   DVT (deep venous thrombosis) (HCC) 2014   Right lower extremity, thigh   DVT (deep venous thrombosis) (HCC) 06/30/2013   August 2014. Continued Xarelto  due to persistent chronic DVT R popliteal vein. Plan repeat per oncology March 2016    Fracture of left tibial plateau 06/15/2015   Fracture  tibia/fibula 06/14/2015   HEMORRHOIDS, EXTERNAL 05/05/2007   History of chemotherapy    Hypothyroidism    Incontinence    Neuropathy    of fingers and toes   Pneumonia    hx of years ago   Thyroid  disease    Vitamin D  insufficiency 06/20/2015   Wears glasses     Past Surgical History: Past Surgical History:  Procedure Laterality Date   AXILLARY SENTINEL NODE BIOPSY Right 09/11/2012   Procedure: AXILLARY SENTINEL lymph NODE  BIOPSY;  Surgeon: Harlee Lichtenstein, MD;  Location: [facility] OR;  Service: General;  Laterality: Right;  right nuclear medicine injection 12:30    BREAST LUMPECTOMY     benign lump   BREAST RECONSTRUCTION Right 09/24/2013   Procedure: REVISION OF RIGHT BREAST RECONSTRUCTION WITH REPOSITIONING RIGHT IMPLANT, POSSIBLE EXCISION CAPSULAR CONTRACTURE AND LIPOFILLING FOR FAT GRAFTING;  Surgeon: Marilou Showman, DO;  Location: [facility];  Service: Plastics;  Laterality: Right;   BREAST RECONSTRUCTION WITH PLACEMENT OF TISSUE EXPANDER AND FLEX HD (ACELLULAR HYDRATED DERMIS) Bilateral 09/11/2012   Procedure: BREAST RECONSTRUCTION WITH PLACEMENT OF TISSUE EXPANDER AND FLEX HD (ACELLULAR HYDRATED DERMIS) ADM;  Surgeon: Marilou Showman, DO;  Location: [facility] OR;  Service: Plastics;  Laterality: Bilateral;   COLONOSCOPY  12/20/2004   Willy Harvest   cryoblation of cervix     long time ago   ESOPHAGOGASTRODUODENOSCOPY  2007   sprue   EXTERNAL FIXATION LEG Left 06/15/2015   Procedure: EXTERNAL FIXATION LEG;  Surgeon: Hardy Lia, MD;  Location: [facility] OR;  Service: Orthopedics;  Laterality: Left;  INCISION AND DRAINAGE OF WOUND Left 09/16/2012   Procedure: Left Breast Evacuation of Hematoma;  Surgeon: Marilou Showman, DO;  Location: [facility] OR;  Service: Plastics;  Laterality: Left;   LIPOSUCTION WITH LIPOFILLING Bilateral 09/24/2013   Procedure: LIPOSUCTION WITH LIPOFILLING;  Surgeon: Marilou Showman, DO;  Location: [facility];  Service: Plastics;  Laterality: Bilateral;   Biltalteal filling breast   LIPOSUCTION WITH LIPOFILLING Bilateral 06/08/2020   Procedure: LIPOSUCTION WITH LIPOFILLING;  Surgeon: Thornell Flirt, DO;  Location: [facility];  Service: Plastics;  Laterality: Bilateral;  90 min, please   ORIF TIBIA PLATEAU Left 06/21/2015   Procedure: OPEN REDUCTION INTERNAL FIXATION (ORIF) TIBIAL PLATEAU REMOVAL OF EXTERNAL FISTULA;  Surgeon: Hardy Lia, MD;  Location: [facility] OR;  Service: Orthopedics;  Laterality: Left;   PORT-A-CATH REMOVAL Right 08/07/2013   Procedure: REMOVAL PORT-A-CATH;  Surgeon: Harlee Lichtenstein, MD;  Location: [facility];  Service: General;  Laterality: Right;   PORTACATH PLACEMENT Right 10/09/2012   Procedure: US  GUIDED INSERTION PORT-A-CATH;  Surgeon: Harlee Lichtenstein, MD;  Location: [facility];  Service: General;  Laterality: Right;  Right Subclavian Vein   REMOVAL OF BILATERAL TISSUE EXPANDERS WITH PLACEMENT OF BILATERAL BREAST IMPLANTS Bilateral 06/24/2013   Procedure: REMOVAL OF BILATERAL TISSUE EXPANDERS WITH PLACEMENT OF BILATERAL BREAST IMPLANTS;  Surgeon: Marilou Showman, DO;  Location: [facility];  Service: Plastics;  Laterality: Bilateral;   TOTAL MASTECTOMY Bilateral 09/11/2012   Procedure: bilateral MASTECTOMY;  Surgeon: Harlee Lichtenstein, MD;  Location: [facility] OR;  Service: General;  Laterality: Bilateral;   VULVECTOMY N/A 07/14/2013   Procedure: WIDE LOCAL  EXCISION VULVAR;  Surgeon: Verdia Glad, MD;  Location: [facility];  Service: Gynecology;  Laterality: N/A;    Allergies: Allergies as of 10/30/2023 - Review Complete 10/30/2023  Allergen Reaction Noted   Ciprofloxacin  Hives, Itching, and Other (See Comments) 11/07/2012   Hydromorphone  hcl Itching 06/16/2015   Nitrofurantoin  Hives, Rash, and Other (See Comments) 07/01/2015   Tizanidine Other (See Comments) 05/03/2022   Gluten meal Other (See Comments) 09/05/2012   Tramadol  Other (See Comments)  04/26/2023   Melatonin Other (See Comments) 04/05/2021    Medications:  Current Outpatient Medications:    acetaminophen  (TYLENOL ) 325 MG tablet, Take by mouth., Disp: , Rfl:    AMBULATORY NON FORMULARY MEDICATION, BLOOD PRESSURE MONITOR - ARM CUFF PER PATIENT PREFERENCE / INSURANCE COVERAGE, DX: ELEVATED BLOOD PRESSURE, Disp: 1 Units, Rfl: 99   atorvastatin  (LIPITOR) 10 MG tablet, TAKE 1 TABLET BY MOUTH EVERYDAY AT BEDTIME, Disp: 90 tablet, Rfl: 0   celecoxib (CELEBREX) 200 MG capsule, Take 200 mg by mouth 2 (two) times daily., Disp: , Rfl:    Cholecalciferol (VITAMIN D -3 PO), Take 1 tablet by mouth daily., Disp: , Rfl:    levothyroxine  (SYNTHROID ) 112 MCG tablet, TAKE 1 TABLET BY MOUTH DAILY BEFORE BREAKFAST., Disp: 90 tablet, Rfl: 3   oxyCODONE  (OXY IR/ROXICODONE ) 5 MG immediate release tablet, Take 5 mg by mouth every 4 (four) hours as needed., Disp: , Rfl:    pregabalin (LYRICA) 100 MG capsule, Take 100 mg by mouth 3 (three) times daily as needed., Disp: , Rfl:    prochlorperazine  (COMPAZINE ) 5 MG tablet, Take 1-2 tablets (5-10 mg total) by mouth every 6 (six) hours as needed for nausea or vomiting., Disp: 240 tablet, Rfl: 1   silver  sulfADIAZINE  (SILVADENE ) 1 % cream, Apply 1 Application topically daily., Disp: 50 g, Rfl: 0   sulfamethoxazole -trimethoprim  (BACTRIM  DS)  800-160 MG tablet, Take 1 tablet by mouth 2 (two) times daily., Disp: 14 tablet, Rfl: 0   venlafaxine  XR (EFFEXOR -XR) 150 MG 24 hr capsule, TAKE 1 CAPSULE BY MOUTH DAILY WITH BREAKFAST, Disp: 90 capsule, Rfl: 1  Social History: Social History   Tobacco Use   Smoking status: Never   Smokeless tobacco: Never  Vaping Use   Vaping status: Never Used  Substance Use Topics   Alcohol  use: No   Drug use: No    Family Medical History: Family History  Problem Relation Age of Onset   Breast cancer Paternal Aunt        dx in her 57s   Lymphoma Paternal Uncle    Breast cancer Paternal Grandmother        dx >50   Heart  attack Paternal Grandfather    Breast cancer Other        2 maternal great aunts with breast cancer >50   Osteoporosis Mother        controlled with diet/exercise   COPD Father    Colon cancer Neg Hx    Colon polyps Neg Hx    Esophageal cancer Neg Hx    Rectal cancer Neg Hx    Stomach cancer Neg Hx     Physical Examination: Vitals:   10/30/23 1053  BP: 138/84    General: Patient is in no apparent distress. Attention to examination is appropriate.  Neck:   Supple.  Full range of motion.  Respiratory: Patient is breathing without any difficulty.   NEUROLOGICAL:     Awake, alert, oriented to person, place, and time.  Speech is clear and fluent.   Cranial Nerves: Pupils equal round and reactive to light.  Facial tone is symmetric.  Facial sensation is symmetric. Shoulder shrug is symmetric. Tongue protrusion is midline.    Strength: Per family report she originally had no function of her left lower extremity, but however now has improved function with antigravity movements throughout.  She does have some decreased quality of movement in the left lower extremity compared to the right.  Imaging: MRIs were been reviewed and I personally evaluated.  Shows decompression of her conus medullaris with interval fusion. I have personally reviewed the images and agree with the above interpretation.  There does appear to be a possible  pseudomeningocele on the left at approximately the L1 level.  [facility]  [address] [place], [address]  [place], [place] [address] Tel: [phone] Fax: [phone] Test Date:  10/22/2023   Patient: Tavonna Worthington DOB: [birth year] Physician: Rommie Coats, MD  Sex: Female Height: 5\' 6"  Ref Phys: Henderson Lock, MD  ID#: [id number]     Technician:      History: This is a 68 year old female with left lower limb pain.   NCV & EMG Findings: Extensive electrodiagnostic evaluation of the left lower limb shows: Left sural and  superficial peroneal/fibular sensory responses are within normal limits. Left peroneal/fibular (EDB) motor nerve shows evidence of a normal anatomic variant, an accessory peroneal. When accounting for this, the left peroneal/fibular (EDB) motor response is within normal limits. Left tibial (AH) motor response is within normal limits. Left H reflex is absent. Chronic motor axon loss changes WITH accompanying active denervation changes are seen in the left medial head of gastrocnemius and short head of biceps femoris muscles. Chronic motor axon loss changes WITHOUT accompanying active denervation changes are seen in the left tibialis anterior, flexor digitorum longus, and gluteus medius  muscles. Lumbosacral paraspinal muscles were deferred due to prior lumbosacral spine surgery.   Impression: This is an abnormal study. The findings are most consistent with the following: An active/ongoing intraspinal canal lesion (ie: motor radiculopathy) at the left S1 root or segment, moderate in degree electrically. The residuals of an old intraspinal canal lesion (ie: motor radiculopathy) at the left L5 root or segment, mild in degree electrically. No electrodiagnostic evidence of a large fiber sensorimotor neuropathy. No electrodiagnostic evidence of left tibial, peroneal/fibular, or sciatic mononeuropathy.       ___________________________ Rommie Coats, MD    [facility]  [address] [place], [address]  [place], [place] [address] Tel: [phone] Fax: [phone] Test Date:  10/22/2023   Patient: Merlin Ege DOB: 1955/11/09 Physician: Rommie Coats, MD  Sex: Female Height: 5\' 6"  Ref Phys: Henderson Lock, MD  ID#: [id number]     Technician:      History: This is a 68 year old female with left lower limb pain.   Findings: High frequency (4.0-16.0 MHz) B-mode, nonvascular ultrasound of the left lower limb shows: Cross sectional area of the left sciatic (popliteal fossa), peroneal/fibular  (popliteal fossa to fibular head), and tibial (popliteal fossa) nerves are within normal limits.  The left lateral femoral cutaneous nerve could not be visualized nor was Tinel's sign present.  No other obvious lesion involving the adjacent bone or tendon is identified. No definite vascular abnormalities.   Impression: This is a normal neuromuscular ultrasound of the left lower limb. Specifically, there is no ultrasonographic evidence of entrapment or other focal pathology along the studied course of the left sciatic nerve (popliteal fossa), peroneal/fibular nerve (popliteal fossa to fibular head), or tibial nerve (popliteal fossa).   _______________   Rommie Coats, MD [facility]   Medical Decision Making/Assessment and Plan: Ms. Wetherby is a pleasant 68 y.o. 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  She has had difficulty with bowel and bladder incontinence ever since.  She has had a revision surgery for ongoing compression.  Unfortunately continues to have left lower extremity pain that is migratory, she does have a spinal cord stimulator placed and has had a recent adjustment and reprogramming which has given her some left-sided coverage, however this is not given her perfect level of relief.  In regards to lower extremity mononeuropathies, I had her undergo a repeat EMG/nerve conduction study as well as peripheral nerve ultrasound.  The conclusions of those are written above but in summary demonstrate a spinal origin of her symptoms.  Given her mechanism of injury including a severe spinal injury with a CSF leak and pseudomeningocele, we feel that she does not have a clear peripheral mechanism.  It is unlikely that a peripheral nerve decompression will help with her symptom management, they were especially interested in whether or not this would help with her bladder issues and incontinence, however I think this is more related to her  intraspinal injury rather than a peripheral etiology.   Thank you for involving me in the care of this patient.    Carroll Clamp MD/MSCR Neurosurgery   I spent a total of 30 minutes with this patient today, reviewing her outside records, evaluating her face-to-face, going over her imaging and, coordination of her care going forward, and documentation.

## 2023-11-21 ENCOUNTER — Encounter: Payer: Self-pay | Admitting: Family Medicine

## 2023-12-19 ENCOUNTER — Ambulatory Visit
Admission: EM | Admit: 2023-12-19 | Discharge: 2023-12-19 | Disposition: A | Discharge status: Home / Self Care | Attending: Family Medicine | Admitting: Family Medicine

## 2023-12-19 DIAGNOSIS — N3001 Acute cystitis with hematuria: Secondary | ICD-10-CM

## 2023-12-19 LAB — POCT URINALYSIS DIP (MANUAL ENTRY)
Bilirubin, UA: NEGATIVE
Glucose, UA: NEGATIVE mg/dL
Ketones, POC UA: NEGATIVE mg/dL
Nitrite, UA: NEGATIVE
Protein Ur, POC: 30 mg/dL — AB
Spec Grav, UA: 1.02 (ref 1.010–1.025)
Urobilinogen, UA: 0.2 U/dL
pH, UA: 6 (ref 5.0–8.0)

## 2023-12-19 MED ORDER — CEFDINIR 300 MG PO CAPS: 300.0000 mg | ORAL_CAPSULE | Freq: Two times a day (BID) | ORAL | 0 refills | Status: AC

## 2023-12-19 NOTE — ED Provider Notes (Signed)
 [facility]    CSN: [id number] Arrival date & time: 12/19/23  1405      History   Chief Complaint No chief complaint on file.   HPI Nancy Beard is a 68 y.o. female.   HPI 68 year old female presents with urinary odor since last week and new onset of cloudy urine for the last few days.  PMH significant for DVT, right breast cancer, and hypothyroidism.  Patient is accompanied by her aunt this afternoon.  Past Medical History:  Diagnosis Date   Anxiety    Blood transfusion without reported diagnosis 2014   Breast cancer (HCC) 2014   right, bil mast   Celiac disease    Depression    DIVERTICULOSIS, COLON 05/05/2007   DVT (deep venous thrombosis) (HCC) 2014   Right lower extremity, thigh   DVT (deep venous thrombosis) (HCC) 06/30/2013   August 2014. Continued Xarelto  due to persistent chronic DVT R popliteal vein. Plan repeat per oncology March 2016    Fracture of left tibial plateau 06/15/2015   Fracture tibia/fibula 06/14/2015   HEMORRHOIDS, EXTERNAL 05/05/2007   History of chemotherapy    Hypothyroidism    Incontinence    Neuropathy    of fingers and toes   Pneumonia    hx of years ago   Thyroid  disease    Vitamin D  insufficiency 06/20/2015   Wears glasses     Patient Active Problem List   Diagnosis Date Noted   Conus medullaris syndrome (HCC) 10/09/2023   Dysuria 10/05/2022   Recurrent major depressive disorder, in partial remission (HCC) 10/05/2022   Pain of left lower extremity 05/17/2022   Chills 03/12/2022   Elevated blood pressure reading 02/06/2022   Acute cystitis 12/05/2021   Closed avulsion fracture of distal fibula, left, initial encounter 11/18/2020   L1 burst fracture with instrumentation and spinal cord compression 11/18/2020   Acute deep vein thrombosis (DVT) of proximal vein of right lower extremity (HCC) see US  10/20/20- Xarelto  started 10/20/20 10/20/2020   Screening for HIV (human immunodeficiency virus) 04/28/2020   Need for  vaccination with 13-polyvalent pneumococcal conjugate vaccine 04/28/2020   Need for influenza vaccination 04/28/2020   Screening for colon cancer 04/28/2020   Elevated liver enzymes 11/12/2018   Prediabetes 11/12/2018   History of high cholesterol 11/12/2018   Herpes simplex 04/12/2017   Abnormal transaminases 06/27/2015   Iron deficiency 06/24/2015   Vitamin D  deficiency 06/20/2015   Genital herpes 09/06/2014   Neuropathy 09/06/2014   Anticoagulant long-term use 05/01/2014   Persistent headaches 02/22/2014   Hot flashes related to aromatase inhibitor therapy 12/10/2013   Vulvar intraepithelial neoplasia III (VIN III) 06/30/2013   Status post bilateral breast reconstruction 06/30/2013   Osteoporosis 05/13/2013   Acquired absence of bilateral breasts and nipples 09/23/2012   Primary cancer of lower-inner quadrant of right female breast (HCC) 06/24/2012   Hyperlipidemia 05/13/2008   Hypothyroidism 01/31/2007   Depression 01/31/2007   Celiac disease/sprue 01/31/2007    Past Surgical History:  Procedure Laterality Date   AXILLARY SENTINEL NODE BIOPSY Right 09/11/2012   Procedure: AXILLARY SENTINEL lymph NODE  BIOPSY;  Surgeon: Harlee Lichtenstein, MD;  Location: [facility] OR;  Service: General;  Laterality: Right;  right nuclear medicine injection 12:30    BREAST LUMPECTOMY     benign lump   BREAST RECONSTRUCTION Right 09/24/2013   Procedure: REVISION OF RIGHT BREAST RECONSTRUCTION WITH REPOSITIONING RIGHT IMPLANT, POSSIBLE EXCISION CAPSULAR CONTRACTURE AND LIPOFILLING FOR FAT GRAFTING;  Surgeon: Marilou Showman, DO;  Location:  [facility];  Service: Plastics;  Laterality: Right;   BREAST RECONSTRUCTION WITH PLACEMENT OF TISSUE EXPANDER AND FLEX HD (ACELLULAR HYDRATED DERMIS) Bilateral 09/11/2012   Procedure: BREAST RECONSTRUCTION WITH PLACEMENT OF TISSUE EXPANDER AND FLEX HD (ACELLULAR HYDRATED DERMIS) ADM;  Surgeon: Marilou Showman, DO;  Location: [facility] OR;  Service: Plastics;   Laterality: Bilateral;   COLONOSCOPY  12/20/2004   Willy Harvest   cryoblation of cervix     long time ago   ESOPHAGOGASTRODUODENOSCOPY  2007   sprue   EXTERNAL FIXATION LEG Left 06/15/2015   Procedure: EXTERNAL FIXATION LEG;  Surgeon: Hardy Lia, MD;  Location: [facility] OR;  Service: Orthopedics;  Laterality: Left;   INCISION AND DRAINAGE OF WOUND Left 09/16/2012   Procedure: Left Breast Evacuation of Hematoma;  Surgeon: Marilou Showman, DO;  Location: [facility] OR;  Service: Plastics;  Laterality: Left;   LIPOSUCTION WITH LIPOFILLING Bilateral 09/24/2013   Procedure: LIPOSUCTION WITH LIPOFILLING;  Surgeon: Marilou Showman, DO;  Location: [facility];  Service: Plastics;  Laterality: Bilateral;  Biltalteal filling breast   LIPOSUCTION WITH LIPOFILLING Bilateral 06/08/2020   Procedure: LIPOSUCTION WITH LIPOFILLING;  Surgeon: Thornell Flirt, DO;  Location: [facility];  Service: Plastics;  Laterality: Bilateral;  90 min, please   ORIF TIBIA PLATEAU Left 06/21/2015   Procedure: OPEN REDUCTION INTERNAL FIXATION (ORIF) TIBIAL PLATEAU REMOVAL OF EXTERNAL FISTULA;  Surgeon: Hardy Lia, MD;  Location: [facility] OR;  Service: Orthopedics;  Laterality: Left;   PORT-A-CATH REMOVAL Right 08/07/2013   Procedure: REMOVAL PORT-A-CATH;  Surgeon: Harlee Lichtenstein, MD;  Location: [facility];  Service: General;  Laterality: Right;   PORTACATH PLACEMENT Right 10/09/2012   Procedure: US  GUIDED INSERTION PORT-A-CATH;  Surgeon: Harlee Lichtenstein, MD;  Location: [facility];  Service: General;  Laterality: Right;  Right Subclavian Vein   REMOVAL OF BILATERAL TISSUE EXPANDERS WITH PLACEMENT OF BILATERAL BREAST IMPLANTS Bilateral 06/24/2013   Procedure: REMOVAL OF BILATERAL TISSUE EXPANDERS WITH PLACEMENT OF BILATERAL BREAST IMPLANTS;  Surgeon: Marilou Showman, DO;  Location: [facility];  Service: Plastics;  Laterality: Bilateral;   TOTAL MASTECTOMY Bilateral 09/11/2012    Procedure: bilateral MASTECTOMY;  Surgeon: Harlee Lichtenstein, MD;  Location: [facility] OR;  Service: General;  Laterality: Bilateral;   VULVECTOMY N/A 07/14/2013   Procedure: WIDE LOCAL  EXCISION VULVAR;  Surgeon: Verdia Glad, MD;  Location: [facility];  Service: Gynecology;  Laterality: N/A;    OB History   No obstetric history on file.      Home Medications    Prior to Admission medications   Medication Sig Start Date End Date Taking? Authorizing Provider  cefdinir (OMNICEF) 300 MG capsule Take 1 capsule (300 mg total) by mouth 2 (two) times daily for 7 days. 12/19/23 12/26/23 Yes Leonides Ramp, FNP  acetaminophen  (TYLENOL ) 325 MG tablet Take by mouth. 09/28/20   [provider]  AMBULATORY NON FORMULARY MEDICATION BLOOD PRESSURE MONITOR - ARM CUFF PER PATIENT PREFERENCE / INSURANCE COVERAGE, DX: ELEVATED BLOOD PRESSURE 02/22/20   Alexander, Natalie, DO  atorvastatin  (LIPITOR) 10 MG tablet TAKE 1 TABLET BY MOUTH EVERYDAY AT BEDTIME 02/25/23   Josepha Nickels, DO  celecoxib (CELEBREX) 200 MG capsule Take 200 mg by mouth 2 (two) times daily.    [provider]  Cholecalciferol (VITAMIN D -3 PO) Take 1 tablet by mouth daily.    [provider]  levothyroxine  (SYNTHROID ) 112 MCG tablet TAKE 1 TABLET BY MOUTH DAILY BEFORE BREAKFAST. 01/07/23  Josepha Nickels, DO  oxyCODONE  (OXY IR/ROXICODONE ) 5 MG immediate release tablet Take 5 mg by mouth every 4 (four) hours as needed. 02/11/22   [provider]  pregabalin (LYRICA) 100 MG capsule Take 100 mg by mouth 3 (three) times daily as needed. 12/01/21   [provider]  prochlorperazine  (COMPAZINE ) 5 MG tablet Take 1-2 tablets (5-10 mg total) by mouth every 6 (six) hours as needed for nausea or vomiting. 10/04/23   Cherre Cornish, NP  silver  sulfADIAZINE  (SILVADENE ) 1 % cream Apply 1 Application topically daily. 09/01/22   Stephany Ehrich, MD  venlafaxine  XR (EFFEXOR -XR) 150 MG 24 hr capsule TAKE 1 CAPSULE BY MOUTH  DAILY WITH BREAKFAST 10/09/23   Josepha Nickels, DO    Family History Family History  Problem Relation Age of Onset   Breast cancer Paternal Aunt        dx in her 49s   Lymphoma Paternal Uncle    Breast cancer Paternal Grandmother        dx >50   Heart attack Paternal Grandfather    Breast cancer Other        2 maternal great aunts with breast cancer >50   Osteoporosis Mother        controlled with diet/exercise   COPD Father    Colon cancer Neg Hx    Colon polyps Neg Hx    Esophageal cancer Neg Hx    Rectal cancer Neg Hx    Stomach cancer Neg Hx     Social History Social History   Tobacco Use   Smoking status: Never   Smokeless tobacco: Never  Vaping Use   Vaping status: Never Used  Substance Use Topics   Alcohol  use: No   Drug use: No     Allergies   Ciprofloxacin , Hydromorphone  hcl, Nitrofurantoin , Tizanidine, Gluten meal, Tramadol , and Melatonin   Review of Systems Review of Systems  Genitourinary:  Positive for frequency.       Malodorous urine for 1 week     Physical Exam Triage Vital Signs ED Triage Vitals [12/19/23 1430]  Encounter Vitals Group     BP      Systolic BP Percentile      Diastolic BP Percentile      Pulse      Resp      Temp      Temp src      SpO2      Weight      Height      Head Circumference      Peak Flow      Pain Score 0     Pain Loc      Pain Education      Exclude from Growth Chart    No data found.  Updated Vital Signs BP 136/84   Pulse (!) 101   Temp 98.3 F (36.8 C)   Resp 19   LMP 09/11/2012   SpO2 98%    Physical Exam Vitals and nursing note reviewed.  Constitutional:      General: She is not in acute distress.    Appearance: Normal appearance. She is normal weight. She is not ill-appearing.  HENT:     Head: Normocephalic and atraumatic.     Mouth/Throat:     Mouth: Mucous membranes are moist.     Pharynx: Oropharynx is clear.  Eyes:     Extraocular Movements: Extraocular movements intact.      Conjunctiva/sclera: Conjunctivae normal.  Pupils: Pupils are equal, round, and reactive to light.  Cardiovascular:     Rate and Rhythm: Normal rate and regular rhythm.     Pulses: Normal pulses.     Heart sounds: Normal heart sounds.  Pulmonary:     Effort: Pulmonary effort is normal.     Breath sounds: Normal breath sounds. No wheezing, rhonchi or rales.  Abdominal:     Tenderness: There is no right CVA tenderness or left CVA tenderness.  Musculoskeletal:        General: Normal range of motion.     Cervical back: Normal range of motion and neck supple.  Skin:    General: Skin is warm and dry.  Neurological:     General: No focal deficit present.     Mental Status: She is alert and oriented to person, place, and time. Mental status is at baseline.  Psychiatric:        Mood and Affect: Mood normal.        Behavior: Behavior normal.        Thought Content: Thought content normal.      UC Treatments / Results  Labs (all labs ordered are listed, but only abnormal results are displayed) Labs Reviewed  POCT URINALYSIS DIP (MANUAL ENTRY) - Abnormal; Notable for the following components:      Result Value   Clarity, UA cloudy (*)    Blood, UA trace-intact (*)    Protein Ur, POC =30 (*)    Leukocytes, UA Moderate (2+) (*)    All other components within normal limits  URINE CULTURE    EKG   Radiology No results found.  Procedures Procedures (including critical care time)  Medications Ordered in UC Medications - No data to display  Initial Impression / Assessment and Plan / UC Course  I have reviewed the triage vital signs and the nursing notes.  Pertinent labs & imaging results that were available during my care of the patient were reviewed by me and considered in my medical decision making (see chart for details).     MDM: 1.  Acute cystitis with hematuria-UA revealed above, urine culture ordered, Rx'd cefdinir 300 mg capsule: Take 1 capsule twice daily x 7  days. Advised patient to take medication as directed with food to completion.  Encouraged to increase daily water intake to 64 ounces per day while taking this medication.  Advised we will follow-up with urine culture results once received.  Advised if symptoms worsen and/or unresolved please follow with your PCP or here for further evaluation.  Patient discharged home, hemodynamically stable.  Final Clinical Impressions(s) / UC Diagnoses   Final diagnoses:  Acute cystitis with hematuria     Discharge Instructions      Advised patient to take medication as directed with food to completion.  Encouraged to increase daily water intake to 64 ounces per day while taking this medication.  Advised we will follow-up with urine culture results once received.  Advised if symptoms worsen and/or unresolved please follow with your PCP or here for further evaluation.   ED Prescriptions     Medication Sig Dispense Auth. Provider   cefdinir (OMNICEF) 300 MG capsule Take 1 capsule (300 mg total) by mouth 2 (two) times daily for 7 days. 14 capsule Marinell Igarashi, FNP      PDMP not reviewed this encounter.   Leonides Ramp, FNP 12/19/23 1505

## 2023-12-19 NOTE — ED Triage Notes (Addendum)
 Pt presents to uc with co urinary odor since last week with new onset of cloudy urine the last couple of days. Pt reports hx of incontinence.

## 2023-12-19 NOTE — Discharge Instructions (Addendum)
 Advised patient to take medication as directed with food to completion.  Encouraged to increase daily water intake to 64 ounces per day while taking this medication.  Advised we will follow-up with urine culture results once received.  Advised if symptoms worsen and/or unresolved please follow with your PCP or here for further evaluation.

## 2023-12-20 ENCOUNTER — Telehealth: Payer: Self-pay | Admitting: Neurosurgery

## 2023-12-20 NOTE — Telephone Encounter (Signed)
 Patient's spouse called wanting to know if Dr.Smith will be able to write a prescription for in/out catheter, depends, and poise pads? States that since the patient has been released from our care she has been very incontinent. Medicare is not covering the supplies so they spend $700 or more on these supplies. Wanting to know if we could be of any help. Please advise.

## 2023-12-21 LAB — URINE CULTURE: Culture: 80000 — AB

## 2023-12-23 ENCOUNTER — Ambulatory Visit (HOSPITAL_COMMUNITY): Payer: Self-pay

## 2023-12-23 NOTE — Telephone Encounter (Signed)
 Patient notified to contact her Urologist. She is to call our office back to get a referral to a different Urologist if she continues to have problems with her current location.

## 2024-01-02 ENCOUNTER — Other Ambulatory Visit: Payer: Self-pay | Admitting: Medical-Surgical

## 2024-01-02 NOTE — Telephone Encounter (Unsigned)
 Copied from CRM [id number]. Topic: Clinical - Medication Refill >> Jan 02, 2024 12:05 PM Tisa Forester wrote: Medication: levothyroxine  (SYNTHROID ) 112 MCG tablet  Has the patient contacted their pharmacy? Yes (Agent: If no, request that the patient contact the pharmacy for the refill. If patient does not wish to contact the pharmacy document the reason why and proceed with request.) (Agent: If yes, when and what did the pharmacy advise?)  This is the patient's preferred pharmacy:  [facility] [phone] - [place], [place] - [address] [place] [address] Phone: [phone] Fax: [phone]   Is this the correct pharmacy for this prescription? Yes If no, delete pharmacy and type the correct one.   Has the prescription been filled recently? No  Is the patient out of the medication? Yes , took last pill on 01/02/24   Has the patient been seen for an appointment in the last year OR does the patient have an upcoming appointment? Yes  Can we respond through MyChart? No  Agent: Please be advised that Rx refills may take up to 3 business days. We ask that you follow-up with your pharmacy.

## 2024-01-03 MED ORDER — LEVOTHYROXINE SODIUM 112 MCG PO TABS: 112.0000 ug | ORAL_TABLET | Freq: Every day | ORAL | 0 refills | Status: DC

## 2024-01-03 NOTE — Telephone Encounter (Signed)
 Called to schedule pt with [facility], stated she would call back on Monday to schedule.

## 2024-01-03 NOTE — Telephone Encounter (Signed)
 Pt will need a pcp for continuation of medication refills. Please have her to schedule an appointment

## 2024-01-30 ENCOUNTER — Ambulatory Visit: Payer: Self-pay

## 2024-01-30 NOTE — Telephone Encounter (Signed)
 FYI Only or Action Required?: FYI only for provider.  Patient was last seen in primary care on 10/04/2023 by Willo Mini, NP.  Called Nurse Triage reporting Urinary Frequency.  Symptoms began several days ago.  Interventions attempted: Nothing.  Symptoms are: gradually worsening.  Triage Disposition: No disposition on file.  Patient/caregiver understands and will follow disposition?:      Copied from CRM [id number]. Topic: Clinical - Red Word Triage >> Jan 30, 2024  2:40 PM [name] ORN wrote: Red Word that prompted transfer to Nurse Triage: Patient calling in stating that she thinks she has a bladder infection. She stated that she does her own catheterization. Patient states she is nauseous and has a little bit of a temperature. Urine has a strong odor & it smells fishy. Wants to see if Dr. Willo can just prescribe her an antibiotic rather than her coming in. Reason for Disposition  Bad or foul-smelling urine  Answer Assessment - Initial Assessment Questions 1. SYMPTOM: What's the main symptom you're concerned about? (e.g., frequency, incontinence)     Foul urine, self caths at home,  2. ONSET: When did the  foul odor  start?     Last few days 3. PAIN: Is there any pain? If Yes, ask: How bad is it? (Scale: 1-10; mild, moderate, severe)     no 4. CAUSE: What do you think is causing the symptoms?     uti 5. OTHER SYMPTOMS: Do you have any other symptoms? (e.g., blood in urine, fever, flank pain, pain with urination)     fever 6. PREGNANCY: Is there any chance you are pregnant? When was your last menstrual period?     na  Protocols used: Urinary Symptoms-A-AH

## 2024-01-31 ENCOUNTER — Ambulatory Visit (INDEPENDENT_AMBULATORY_CARE_PROVIDER_SITE_OTHER): Admitting: Sports Medicine

## 2024-01-31 ENCOUNTER — Encounter: Payer: Self-pay | Admitting: Sports Medicine

## 2024-01-31 VITALS — BP 93/66 | HR 98 | Temp 98.1°F | Resp 20 | Ht 66.0 in

## 2024-01-31 DIAGNOSIS — N390 Urinary tract infection, site not specified: Secondary | ICD-10-CM

## 2024-01-31 DIAGNOSIS — M79605 Pain in left leg: Secondary | ICD-10-CM

## 2024-01-31 MED ORDER — CEPHALEXIN 500 MG PO CAPS: 500.0000 mg | ORAL_CAPSULE | Freq: Two times a day (BID) | ORAL | 0 refills | Status: DC

## 2024-01-31 NOTE — Progress Notes (Addendum)
    Procedures performed today:    None.  Independent interpretation of notes and tests performed by another provider:   None.  Brief History, Exam, Impression, and Recommendations:    Recurrent UTI This is a pleasant 68 year old female, she has a neurogenic bladder secondary to spinal cord injury in the past. She self catheterizes. She does wear incontinence pads. She gets frequent urinary tract infections at least 4+ per year. On review of her urine cultures she typically grows out pansensitive E. coli. Unfortunately she is having recurrence of symptomatology, urgency, pain. She is unable to provide a urine specimen today, but considering her history her story is convincing enough to go ahead and treat her. She tells me the antibiotic prescribed 2 times ago was the one that worked the best, this was cephalexin , this will be called in. On further questioning we discovered that her self-catheterization technique was inadequate, she would use simply a sanitary wipe but not an antiseptic, and then she would self cath using a mirror. We discussed the necessity of sterile technique, using an antiseptic as Betadine to clean the area first, and then avoiding touching anywhere but the urethra with her catheter. I think this will minimize the number of UTIs that she gets. We gave her some Betadine swabs and they will get some more over-the-counter. In the future if she does get symptoms they will try to provide a urine specimen. We also discussed atrophic vaginitis and its role in recurrent urinary tract infections, they will do some reading and consider Premarin cream. I think the I do have a follow-up with Benton Gave, PA-C for discussion of establishing care.  Pain of left lower extremity Also has pain left lower extremity, she describes it as an instability of her left ankle, she endorses more electric sensations, numbness and tingling. I think her problem is more related to her spinal  cord injury +/- lumbar spondylitic processes. We will start with some physical therapy for her back and ankle, if insufficient improvement we can consider additional imaging, medication adjustment, or epidural injection treatment.  I spent 40 minutes of total time managing this patient today, this includes chart review, face to face, and non-face to face time.  We talked extensively about her multiple medical problems, I counseled her extensively on self-catheterization technique and reasons why she is getting frequent UTIs.  We also spent an additional 30 minutes of face-to-face time in extended counseling, evaluation and management.   ____________________________________________ Debby PARAS. Curtis, M.D., ABFM., CAQSM., AME. Primary Care and Sports Medicine [facility]  Adjunct Professor of [facility] Medicine  [employer]  Restaurant manager, fast food

## 2024-01-31 NOTE — Assessment & Plan Note (Signed)
 Also has pain left lower extremity, she describes it as an instability of her left ankle, she endorses more electric sensations, numbness and tingling. I think her problem is more related to her spinal cord injury +/- lumbar spondylitic processes. We will start with some physical therapy for her back and ankle, if insufficient improvement we can consider additional imaging, medication adjustment, or epidural injection treatment.

## 2024-01-31 NOTE — Assessment & Plan Note (Addendum)
 This is a pleasant 69 year old female, she has a neurogenic bladder secondary to spinal cord injury in the past. She self catheterizes. She does wear incontinence pads. She gets frequent urinary tract infections at least 4+ per year. On review of her urine cultures she typically grows out pansensitive E. coli. Unfortunately she is having recurrence of symptomatology, urgency, pain. She is unable to provide a urine specimen today, but considering her history her story is convincing enough to go ahead and treat her. She tells me the antibiotic prescribed 2 times ago was the one that worked the best, this was cephalexin , this will be called in. On further questioning we discovered that her self-catheterization technique was inadequate, she would use simply a sanitary wipe but not an antiseptic, and then she would self cath using a mirror. We discussed the necessity of sterile technique, using an antiseptic as Betadine to clean the area first, and then avoiding touching anywhere but the urethra with her catheter. I think this will minimize the number of UTIs that she gets. We gave her some Betadine swabs and they will get some more over-the-counter. In the future if she does get symptoms they will try to provide a urine specimen. We also discussed atrophic vaginitis and its role in recurrent urinary tract infections, they will do some reading and consider Premarin cream. I think the I do have a follow-up with Benton Gave, PA-C for discussion of establishing care.

## 2024-02-26 ENCOUNTER — Ambulatory Visit: Payer: Self-pay | Admitting: Family Medicine

## 2024-02-26 ENCOUNTER — Other Ambulatory Visit: Payer: Self-pay | Admitting: Sports Medicine

## 2024-02-26 DIAGNOSIS — N390 Urinary tract infection, site not specified: Secondary | ICD-10-CM

## 2024-02-26 MED ORDER — CEFIXIME 400 MG PO CAPS: 400.0000 mg | ORAL_CAPSULE | Freq: Every day | ORAL | 0 refills | Status: DC

## 2024-02-26 NOTE — Telephone Encounter (Signed)
 We really need a urine specimen before starting antibiotics, historically has grown pansensitive ecoli.  I will send in an oral 3rd gen cephalosporin (good MIC) but she needs to collect a urine specimen before starting antibiotics, she can drop it off for the lab, I'll place the orders for UA/UCx.  I also do not see that she has made an appt for a new PCP, that needs to happen stat.

## 2024-02-26 NOTE — Telephone Encounter (Signed)
 FYI Only or Action Required?: Action required by provider: update on patient condition.  Patient was last seen in primary care on 01/31/2024 by Curtis Debby PARAS, MD.  Called Nurse Triage reporting Urinary Frequency.  Symptoms began several weeks ago.  Interventions attempted: Prescription medications: cephalexin  500 mg.  Symptoms are: unchanged.  Triage Disposition: See PCP When Office is Open (Within 3 Days)  Patient/caregiver understands and will follow disposition?: Yes                     Reason for Disposition  Prescription request for new medicine (not a refill)  Answer Assessment - Initial Assessment Questions Pt was seen by Dr. ONEIDA a few weeks ago for a UTI and was prescribed an abx for urinary symptoms. Pt states she needs another abx as she is still having those symptoms sent to [facility] in [place]. Pt call back number is  [phone]  1. SYMPTOM: What's the main symptom you're concerned about? (e.g., frequency, incontinence)     Pt states she has to self cath every 4 hours (fell in Feb 2022 and broke back which left pt incontinent per patient);  burning with urination; odor to urine; cloudy urine 2. ONSET: When did the  symptoms  start?     A couple of weeks ago 3. PAIN: Is there any pain? If Yes, ask: How bad is it? (Scale: 1-10; mild, moderate, severe)     No pain, can tell when bladder is getting full 4. CAUSE: What do you think is causing the symptoms?     UTI  Protocols used: Urinary Symptoms-A-AH, Medication Refill and Renewal Call-A-AH

## 2024-02-27 ENCOUNTER — Other Ambulatory Visit: Payer: Self-pay | Admitting: Sports Medicine

## 2024-02-27 ENCOUNTER — Encounter: Payer: Self-pay | Admitting: Sports Medicine

## 2024-02-27 DIAGNOSIS — N390 Urinary tract infection, site not specified: Secondary | ICD-10-CM

## 2024-02-27 NOTE — Telephone Encounter (Signed)
 Attempted to reach patient by phone, rang 10 times with no answer or VM.

## 2024-02-27 NOTE — Telephone Encounter (Signed)
 Patient informed and will bring urine to office today . Will schedule with new provider  at this time as well.

## 2024-02-28 ENCOUNTER — Telehealth: Payer: Self-pay

## 2024-02-28 ENCOUNTER — Ambulatory Visit: Payer: Self-pay | Admitting: Sports Medicine

## 2024-02-28 ENCOUNTER — Telehealth: Payer: Self-pay | Admitting: Family Medicine

## 2024-02-28 LAB — CBC
Hematocrit: 44.1 % (ref 34.0–46.6)
Hemoglobin: 14.1 g/dL (ref 11.1–15.9)
MCH: 28.3 pg (ref 26.6–33.0)
MCHC: 32 g/dL (ref 31.5–35.7)
MCV: 89 fL (ref 79–97)
Platelets: 289 x10E3/uL (ref 150–450)
RBC: 4.98 x10E6/uL (ref 3.77–5.28)
RDW: 14.1 % (ref 11.7–15.4)
WBC: 6.6 x10E3/uL (ref 3.4–10.8)

## 2024-02-28 NOTE — Telephone Encounter (Signed)
 Copied from CRM #[id number]. Topic: Clinical - Lab/Test Results >> Feb 28, 2024 11:17 AM Miquel SAILOR wrote: Reason for CRM: Dr. Mardeen Dials instructed PT for labs but PT went to wrong clinic . Due for PCP she can not see the labs results she is requesting lab orders results sent to her STAT. Called and transferred to office

## 2024-02-28 NOTE — Telephone Encounter (Signed)
 Issue already addressed by [place]

## 2024-02-28 NOTE — Telephone Encounter (Signed)
 Received incoming call from Dr. Mardeen [facility] requesting CBC lab results on patient.   CB:[phone] Fax:[phone]  Dr. Edelmira was asked to fax signed medical release form from patient.  Requested lab results has been faxed to [phone].

## 2024-03-02 LAB — UA/M W/RFLX CULTURE, ROUTINE
Bilirubin, UA: NEGATIVE
Glucose, UA: NEGATIVE
Leukocytes,UA: NEGATIVE
Nitrite, UA: POSITIVE — AB
RBC, UA: NEGATIVE
Specific Gravity, UA: >=1.03 — AB (ref 1.005–1.030)
Urobilinogen, Ur: 0.2 mg/dL (ref 0.2–1.0)
pH, UA: 5.5 (ref 5.0–7.5)

## 2024-03-02 LAB — URINE CULTURE, REFLEX

## 2024-03-02 LAB — MICROSCOPIC EXAMINATION: Casts: NONE SEEN /LPF

## 2024-03-13 ENCOUNTER — Ambulatory Visit: Admitting: Urgent Care

## 2024-03-13 ENCOUNTER — Encounter: Payer: Self-pay | Admitting: Urgent Care

## 2024-03-13 ENCOUNTER — Ambulatory Visit: Admitting: Sports Medicine

## 2024-03-13 VITALS — BP 131/86 | HR 104 | Resp 18 | Ht 66.0 in | Wt 168.5 lb

## 2024-03-13 DIAGNOSIS — Z789 Other specified health status: Secondary | ICD-10-CM

## 2024-03-13 DIAGNOSIS — E034 Atrophy of thyroid (acquired): Secondary | ICD-10-CM

## 2024-03-13 DIAGNOSIS — N319 Neuromuscular dysfunction of bladder, unspecified: Secondary | ICD-10-CM

## 2024-03-13 DIAGNOSIS — E785 Hyperlipidemia, unspecified: Secondary | ICD-10-CM

## 2024-03-13 DIAGNOSIS — N390 Urinary tract infection, site not specified: Secondary | ICD-10-CM

## 2024-03-13 DIAGNOSIS — M79605 Pain in left leg: Secondary | ICD-10-CM

## 2024-03-13 LAB — POCT URINALYSIS DIP (CLINITEK)
Bilirubin, UA: NEGATIVE
Blood, UA: NEGATIVE
Glucose, UA: NEGATIVE mg/dL
Ketones, POC UA: NEGATIVE mg/dL
Nitrite, UA: POSITIVE — AB
POC PROTEIN,UA: 30 — AB
Spec Grav, UA: 1.02 (ref 1.010–1.025)
Urobilinogen, UA: 0.2 U/dL
pH, UA: 6 (ref 5.0–8.0)

## 2024-03-13 MED ORDER — SULFAMETHOXAZOLE-TRIMETHOPRIM 800-160 MG PO TABS: 1.0000 | ORAL_TABLET | Freq: Two times a day (BID) | ORAL | 0 refills | Status: AC

## 2024-03-13 MED ORDER — TRIMETHOPRIM 100 MG PO TABS: 100.0000 mg | ORAL_TABLET | Freq: Every day | ORAL | 0 refills | Status: DC

## 2024-03-13 NOTE — Progress Notes (Signed)
 Established Patient Office Visit  Subjective:  Patient ID: Nancy Beard, female    DOB: [birth year]  Age: 68 y.o. MRN: [id number]  Chief Complaint  Patient presents with   Transitions Of Care   Urinary Tract Infection    6wk uti f/u pt still having dysuria and cloudy urine    HPI  Discussed the use of AI scribe software for clinical note transcription with the patient, who gave verbal consent to proceed.  History of Present Illness   Nancy Beard is a 68 year old female with recurrent urinary tract infections who presents with ongoing UTI symptoms.  She has been experiencing recurrent urinary tract infections since she began self-catheterization following a fall in February 2022. Symptoms include urinary odor and cloudy urine, persisting despite recent treatment with Suprax . A urine specimen for culture is pending. She believes her UTIs are related to her self-catheterization process, despite efforts to maintain sterile technique.  She is incontinent of both bowel and bladder, which complicates her condition. She experiences general pain and discomfort, although not specifically at the site of catheterization. She has been using Azo tabs for discomfort and frequency.  She has had three UTIs in the past two months, more frequent than her usual pattern of one per quarter. She is allergic to Cipro  and nitrofurantoin . She previously received a seven-day course of Suprax , prescribed by a previous doctor.  She has not been satisfied with her previous urologist and is seeking a new referral. She has not been offered a suprapubic catheter but has discussed other options like a shunt at a specialized center, which she declined.   With current UTI, pt denies fever, chills, flank pain, N/V.      Patient Active Problem List   Diagnosis Date Noted   Neurogenic bladder disorder 03/15/2024   Self-catheterizes urinary bladder 03/15/2024   Conus medullaris syndrome (HCC)  10/09/2023   Recurrent major depressive disorder, in partial remission (HCC) 10/05/2022   Pain of left lower extremity 05/17/2022   Chills 03/12/2022   Elevated blood pressure reading 02/06/2022   Closed avulsion fracture of distal fibula, left, initial encounter 11/18/2020   L1 burst fracture with instrumentation and spinal cord compression 11/18/2020   Acute deep vein thrombosis (DVT) of proximal vein of right lower extremity (HCC) see US  10/20/20- Xarelto  started 10/20/20 10/20/2020   Screening for HIV (human immunodeficiency virus) 04/28/2020   Need for vaccination with 13-polyvalent pneumococcal conjugate vaccine 04/28/2020   Need for influenza vaccination 04/28/2020   Screening for colon cancer 04/28/2020   Elevated liver enzymes 11/12/2018   Prediabetes 11/12/2018   History of high cholesterol 11/12/2018   Herpes simplex 04/12/2017   Abnormal transaminases 06/27/2015   Iron deficiency 06/24/2015   Recurrent UTI 06/22/2015   Vitamin D  deficiency 06/20/2015   Genital herpes 09/06/2014   Neuropathy 09/06/2014   Anticoagulant long-term use 05/01/2014   Persistent headaches 02/22/2014   Hot flashes related to aromatase inhibitor therapy 12/10/2013   Vulvar intraepithelial neoplasia III (VIN III) 06/30/2013   Status post bilateral breast reconstruction 06/30/2013   Osteoporosis 05/13/2013   Acquired absence of bilateral breasts and nipples 09/23/2012   Primary cancer of lower-inner quadrant of right female breast (HCC) 06/24/2012   Hyperlipidemia 05/13/2008   Hypothyroidism 01/31/2007   Depression 01/31/2007   Celiac disease/sprue 01/31/2007   Past Medical History:  Diagnosis Date   Anxiety    Blood transfusion without reported diagnosis 2014   Breast cancer (HCC) 2014   right, bil  mast   Celiac disease    Depression    DIVERTICULOSIS, COLON 05/05/2007   DVT (deep venous thrombosis) (HCC) 2014   Right lower extremity, thigh   DVT (deep venous thrombosis) (HCC) 06/30/2013    August 2014. Continued Xarelto  due to persistent chronic DVT R popliteal vein. Plan repeat per oncology March 2016    Fracture of left tibial plateau 06/15/2015   Fracture tibia/fibula 06/14/2015   HEMORRHOIDS, EXTERNAL 05/05/2007   History of chemotherapy    Hypothyroidism    Incontinence    Neuropathy    of fingers and toes   Pneumonia    hx of years ago   Thyroid  disease    Vitamin D  insufficiency 06/20/2015   Wears glasses    Past Surgical History:  Procedure Laterality Date   AXILLARY SENTINEL NODE BIOPSY Right 09/11/2012   Procedure: AXILLARY SENTINEL lymph NODE  BIOPSY;  Surgeon: Krystal JINNY Russell, MD;  Location: [facility] OR;  Service: General;  Laterality: Right;  right nuclear medicine injection 12:30    BREAST LUMPECTOMY     benign lump   BREAST RECONSTRUCTION Right 09/24/2013   Procedure: REVISION OF RIGHT BREAST RECONSTRUCTION WITH REPOSITIONING RIGHT IMPLANT, POSSIBLE EXCISION CAPSULAR CONTRACTURE AND LIPOFILLING FOR FAT GRAFTING;  Surgeon: Estefana Reichert, DO;  Location: [facility];  Service: Plastics;  Laterality: Right;   BREAST RECONSTRUCTION WITH PLACEMENT OF TISSUE EXPANDER AND FLEX HD (ACELLULAR HYDRATED DERMIS) Bilateral 09/11/2012   Procedure: BREAST RECONSTRUCTION WITH PLACEMENT OF TISSUE EXPANDER AND FLEX HD (ACELLULAR HYDRATED DERMIS) ADM;  Surgeon: Estefana Reichert, DO;  Location: [facility] OR;  Service: Plastics;  Laterality: Bilateral;   COLONOSCOPY  12/20/2004   [facility]   cryoblation of cervix     long time ago   ESOPHAGOGASTRODUODENOSCOPY  2007   sprue   EXTERNAL FIXATION LEG Left 06/15/2015   Procedure: EXTERNAL FIXATION LEG;  Surgeon: Ozell Bruch, MD;  Location: [facility] OR;  Service: Orthopedics;  Laterality: Left;   INCISION AND DRAINAGE OF WOUND Left 09/16/2012   Procedure: Left Breast Evacuation of Hematoma;  Surgeon: Estefana Reichert, DO;  Location: [facility] OR;  Service: Plastics;  Laterality: Left;   LIPOSUCTION WITH LIPOFILLING Bilateral 09/24/2013   Procedure:  LIPOSUCTION WITH LIPOFILLING;  Surgeon: Estefana Reichert, DO;  Location: [facility];  Service: Plastics;  Laterality: Bilateral;  Biltalteal filling breast   LIPOSUCTION WITH LIPOFILLING Bilateral 06/08/2020   Procedure: LIPOSUCTION WITH LIPOFILLING;  Surgeon: Lowery Estefana RAMAN, DO;  Location: [facility];  Service: Plastics;  Laterality: Bilateral;  90 min, please   ORIF TIBIA PLATEAU Left 06/21/2015   Procedure: OPEN REDUCTION INTERNAL FIXATION (ORIF) TIBIAL PLATEAU REMOVAL OF EXTERNAL FISTULA;  Surgeon: Ozell Bruch, MD;  Location: [facility] OR;  Service: Orthopedics;  Laterality: Left;   PORT-A-CATH REMOVAL Right 08/07/2013   Procedure: REMOVAL PORT-A-CATH;  Surgeon: Krystal JINNY Russell, MD;  Location: [facility];  Service: General;  Laterality: Right;   PORTACATH PLACEMENT Right 10/09/2012   Procedure: US  GUIDED INSERTION PORT-A-CATH;  Surgeon: Krystal JINNY Russell, MD;  Location: [facility];  Service: General;  Laterality: Right;  Right Subclavian Vein   REMOVAL OF BILATERAL TISSUE EXPANDERS WITH PLACEMENT OF BILATERAL BREAST IMPLANTS Bilateral 06/24/2013   Procedure: REMOVAL OF BILATERAL TISSUE EXPANDERS WITH PLACEMENT OF BILATERAL BREAST IMPLANTS;  Surgeon: Estefana Reichert, DO;  Location: [facility];  Service: Plastics;  Laterality: Bilateral;   TOTAL MASTECTOMY Bilateral 09/11/2012   Procedure: bilateral MASTECTOMY;  Surgeon: Krystal JINNY Russell, MD;  Location: [facility] OR;  Service: General;  Laterality: Bilateral;   VULVECTOMY N/A 07/14/2013   Procedure: WIDE LOCAL  EXCISION VULVAR;  Surgeon: Toribio LITTIE Percy, MD;  Location: [facility];  Service: Gynecology;  Laterality: N/A;   Social History   Tobacco Use   Smoking status: Never   Smokeless tobacco: Never  Vaping Use   Vaping status: Never Used  Substance Use Topics   Alcohol  use: No   Drug use: No      ROS: as noted in HPI  Objective:     BP 131/86   Pulse (!) 104    Resp 18   Ht 5' 6 (1.676 m)   Wt 168 lb 8 oz (76.4 kg)   LMP 09/11/2012   SpO2 95%   BMI 27.20 kg/m  BP Readings from Last 3 Encounters:  03/13/24 131/86  01/31/24 93/66  12/19/23 136/84   Wt Readings from Last 3 Encounters:  03/13/24 168 lb 8 oz (76.4 kg)  10/30/23 167 lb (75.8 kg)  10/09/23 167 lb (75.8 kg)      Physical Exam Vitals and nursing note reviewed. Exam conducted with a chaperone present.  Constitutional:      General: She is not in acute distress.    Appearance: Normal appearance. She is not ill-appearing, toxic-appearing or diaphoretic.  HENT:     Head: Normocephalic and atraumatic.  Eyes:     General:        Right eye: No discharge.        Left eye: No discharge.     Extraocular Movements: Extraocular movements intact.     Pupils: Pupils are equal, round, and reactive to light.  Cardiovascular:     Rate and Rhythm: Normal rate.  Pulmonary:     Effort: Pulmonary effort is normal. No respiratory distress.  Abdominal:     General: Abdomen is flat. There is no distension.     Palpations: Abdomen is soft.     Tenderness: There is no abdominal tenderness. There is no right CVA tenderness, left CVA tenderness, guarding or rebound.  Skin:    General: Skin is warm and dry.     Coloration: Skin is not jaundiced.     Findings: No bruising, erythema or rash.  Neurological:     Mental Status: She is alert and oriented to person, place, and time.     Gait: Gait abnormal (pt using rollator).      Results for orders placed or performed in visit on 03/13/24  POCT URINALYSIS DIP (CLINITEK)  Result Value Ref Range   Color, UA yellow yellow   Clarity, UA cloudy (A) clear   Glucose, UA negative negative mg/dL   Bilirubin, UA negative negative   Ketones, POC UA negative negative mg/dL   Spec Grav, UA 8.979 8.989 - 1.025   Blood, UA negative negative   pH, UA 6.0 5.0 - 8.0   POC PROTEIN,UA =30 (A) negative, trace   Urobilinogen, UA 0.2 0.2 or 1.0 E.U./dL    Nitrite, UA Positive (A) Negative   Leukocytes, UA Moderate (2+) (A) Negative    Last CBC Lab Results  Component Value Date   WBC 6.6 02/27/2024   HGB 14.1 02/27/2024   HCT 44.1 02/27/2024   MCV 89 02/27/2024   MCH 28.3 02/27/2024   RDW 14.1 02/27/2024   PLT 289 02/27/2024   Last metabolic panel Lab Results  Component Value Date   GLUCOSE 91 10/08/2023   NA 145 (H) 10/08/2023   K 4.5  10/08/2023   CL 106 10/08/2023   CO2 24 10/08/2023   BUN 11 10/08/2023   CREATININE 0.68 10/08/2023   EGFR 95 10/08/2023   CALCIUM  9.4 10/08/2023   PHOS 3.2 06/15/2015   PROT 6.6 10/08/2023   ALBUMIN  4.4 10/08/2023   LABGLOB 2.2 10/08/2023   BILITOT 0.4 10/08/2023   ALKPHOS 178 (H) 10/08/2023   AST 50 (H) 10/08/2023   ALT 57 (H) 10/08/2023   ANIONGAP 7 10/04/2015      The ASCVD Risk score (Arnett DK, et al., 2019) failed to calculate for the following reasons:   Risk score cannot be calculated because patient has a medical history suggesting prior/existing ASCVD  Assessment & Plan:  Recurrent UTI -     POCT URINALYSIS DIP (CLINITEK) -     Urine Culture -     Sulfamethoxazole -Trimethoprim ; Take 1 tablet by mouth 2 (two) times daily for 7 days. (UTI Treatment)  Dispense: 14 tablet; Refill: 0 -     Trimethoprim ; Take 1 tablet (100 mg total) by mouth daily. (UTI PREVENTION)  Dispense: 30 tablet; Refill: 0 -     Ambulatory referral to Urology  Self-catheterizes urinary bladder  Neurogenic bladder disorder  Assessment and Plan    Recurrent urinary tract infections due to self-catheterization Recurrent UTIs due to self-catheterization with E. coli sensitive to all antibiotics. Previous Cefalexin ineffective, allergic to Cipro  and nitrofurantoin , Suprax  costly. - Prescribed Bactrim  (sulfamethoxazole  trimethoprim ) twice a day for 7 days. - Prescribed trimethoprim  for daily UTI prevention post-Bactrim . - Recommended cranberry capsules or Cystex for urine acidification. - Discontinued  Suprax  due to cost. - Referred to urology for further evaluation.  Neurogenic bladder with urinary and fecal incontinence Neurogenic bladder with urinary and fecal incontinence post-fall February 2022. Self-catheterization since fall, complicating condition and contributing to UTIs. Discussed suprapubic catheter as alternative. - Discuss further tx options with urology     Return in about 2 months (around 05/13/2024) for Annual Physical.   Benton LITTIE Gave, PA

## 2024-03-13 NOTE — Patient Instructions (Addendum)
 Start taking bactrim  twice daily x 7 days. After completion of this, start taking trimethoprim  once daily for prevention.  Please follow up with urology. [facility] Address: [address], [place], [place] [address] Phone: [phone]  Take cystex 5mL once daily to help prevent UTIs - this keeps the urine acidic.  Please schedule your routine annual physical with fasting labs.

## 2024-03-15 ENCOUNTER — Encounter: Payer: Self-pay | Admitting: Urgent Care

## 2024-03-15 DIAGNOSIS — N319 Neuromuscular dysfunction of bladder, unspecified: Secondary | ICD-10-CM | POA: Insufficient documentation

## 2024-03-15 DIAGNOSIS — Z789 Other specified health status: Secondary | ICD-10-CM | POA: Insufficient documentation

## 2024-03-17 ENCOUNTER — Ambulatory Visit: Payer: Self-pay | Admitting: Urgent Care

## 2024-03-17 ENCOUNTER — Encounter: Payer: Self-pay | Admitting: Sports Medicine

## 2024-03-17 LAB — URINE CULTURE

## 2024-03-31 NOTE — Progress Notes (Signed)
 Chief Complaint: Frequent UTIs  History of Present Illness:  Nancy Beard is a 68 y.o. female who is seen in consultation from [place], [place], [place] for evaluation of recurring urinary tract infections.  Patient sustained a fracture of her lower thoracic/lumbar spine in February 2022.  Following that she developed incontinence of urine and stool.  She subsequently underwent urodynamics at [facility].  She had bladder capacity of 465 mL.  She did carry a large urinary residual.  Since her initial evaluation, she has not had spontaneous voiding.  The patient is on CIC.  She has been on that for the past 3 years.  She is catheterized every 4-6 hours.  In between, she will occasionally/intermittently have incontinence.  She does not have urgency before her catheterizations.  She has had 6 positive cultures since July of last year.  All have shown the same pansensitive E. coli.  She is currently on methenamine hippurate twice a day as well as trimethoprim .  She states that most of the time when she is treated with antibiotics for her UTI, she is not having pain in her bladder, gross hematuria or chills.  She usually just has foul-smelling urine or cloudy urine.  This clears for short period of time after she is on an antibiotic.  She denies any febrile UTIs, gross hematuria or abdominal or back pain with her symptoms.   Past Medical History:  Past Medical History:  Diagnosis Date   Anxiety    Blood transfusion without reported diagnosis 2014   Breast cancer (HCC) 2014   right, bil mast   Celiac disease    Depression    DIVERTICULOSIS, COLON 05/05/2007   DVT (deep venous thrombosis) (HCC) 2014   Right lower extremity, thigh   DVT (deep venous thrombosis) (HCC) 06/30/2013   August 2014. Continued Xarelto  due to persistent chronic DVT R popliteal vein. Plan repeat per oncology March 2016    Fracture of left tibial plateau 06/15/2015   Fracture tibia/fibula 06/14/2015    HEMORRHOIDS, EXTERNAL 05/05/2007   History of chemotherapy    Hypothyroidism    Incontinence    Neuropathy    of fingers and toes   Pneumonia    hx of years ago   Thyroid  disease    Vitamin D  insufficiency 06/20/2015   Wears glasses     Past Surgical History:  Past Surgical History:  Procedure Laterality Date   AXILLARY SENTINEL NODE BIOPSY Right 09/11/2012   Procedure: AXILLARY SENTINEL lymph NODE  BIOPSY;  Surgeon: Krystal JINNY Russell, MD;  Location[facility];  Service: General;  Laterality: Right;  right nuclear medicine injection 12:30    BREAST LUMPECTOMY     benign lump   BREAST RECONSTRUCTION Right 09/24/2013   Procedure: REVISION OF RIGHT BREAST RECONSTRUCTION WITH REPOSITIONING RIGHT IMPLANT, POSSIBLE EXCISION CAPSULAR CONTRACTURE AND LIPOFILLING FOR FAT GRAFTING;  Surgeon: Estefana Reichert, DO;  Location: [facility];  Service: Plastics;  Laterality: Right;   BREAST RECONSTRUCTION WITH PLACEMENT OF TISSUE EXPANDER AND FLEX HD (ACELLULAR HYDRATED DERMIS) Bilateral 09/11/2012   Procedure: BREAST RECONSTRUCTION WITH PLACEMENT OF TISSUE EXPANDER AND FLEX HD (ACELLULAR HYDRATED DERMIS) ADM;  Surgeon: Estefana Reichert, DO;  Location: [facility] OR;  Service: Plastics;  Laterality: Bilateral;   COLONOSCOPY  12/20/2004   [place]   cryoblation of cervix     long time ago   ESOPHAGOGASTRODUODENOSCOPY  2007   sprue   EXTERNAL FIXATION LEG Left 06/15/2015   Procedure: EXTERNAL FIXATION LEG;  Surgeon: Ozell Bruch, MD;  Location: [facility] OR;  Service: Orthopedics;  Laterality: Left;   INCISION AND DRAINAGE OF WOUND Left 09/16/2012   Procedure: Left Breast Evacuation of Hematoma;  Surgeon: Estefana Reichert, DO;  Location[facility];  Service: Plastics;  Laterality: Left;   LIPOSUCTION WITH LIPOFILLING Bilateral 09/24/2013   Procedure: LIPOSUCTION WITH LIPOFILLING;  Surgeon: Estefana Reichert, DO;  Location: [facility];  Service: Plastics;  Laterality: Bilateral;  Biltalteal filling breast    LIPOSUCTION WITH LIPOFILLING Bilateral 06/08/2020   Procedure: LIPOSUCTION WITH LIPOFILLING;  Surgeon: Lowery Estefana RAMAN, DO;  Location: [facility];  Service: Plastics;  Laterality: Bilateral;  90 min, please   ORIF TIBIA PLATEAU Left 06/21/2015   Procedure: OPEN REDUCTION INTERNAL FIXATION (ORIF) TIBIAL PLATEAU REMOVAL OF EXTERNAL FISTULA;  Surgeon: Ozell Bruch, MD;  Location: [facility] OR;  Service: Orthopedics;  Laterality: Left;   PORT-A-CATH REMOVAL Right 08/07/2013   Procedure: REMOVAL PORT-A-CATH;  Surgeon: Krystal JINNY Russell, MD;  Location: [facility];  Service: General;  Laterality: Right;   PORTACATH PLACEMENT Right 10/09/2012   Procedure: US  GUIDED INSERTION PORT-A-CATH;  Surgeon: Krystal JINNY Russell, MD;  Location: [facility];  Service: General;  Laterality: Right;  Right Subclavian Vein   REMOVAL OF BILATERAL TISSUE EXPANDERS WITH PLACEMENT OF BILATERAL BREAST IMPLANTS Bilateral 06/24/2013   Procedure: REMOVAL OF BILATERAL TISSUE EXPANDERS WITH PLACEMENT OF BILATERAL BREAST IMPLANTS;  Surgeon: Estefana Reichert, DO;  Location: [facility];  Service: Plastics;  Laterality: Bilateral;   TOTAL MASTECTOMY Bilateral 09/11/2012   Procedure: bilateral MASTECTOMY;  Surgeon: Krystal JINNY Russell, MD;  Location: [facility] OR;  Service: General;  Laterality: Bilateral;   VULVECTOMY N/A 07/14/2013   Procedure: WIDE LOCAL  EXCISION VULVAR;  Surgeon: Toribio LITTIE Percy, MD;  Location: [facility];  Service: Gynecology;  Laterality: N/A;    Allergies:  Allergies  Allergen Reactions   Ciprofloxacin  Hives, Itching and Other (See Comments)    Patient states she had muscle spasms    Hydromorphone  Hcl Itching   Nitrofurantoin  Hives, Rash and Other (See Comments)    Significant transaminitis    Tizanidine Other (See Comments)    Severe hypotension.   Gluten Meal Other (See Comments)   Tramadol  Other (See Comments)    Altered mental status   Melatonin Other  (See Comments)    Sleep apnea Sleep apnea     Family History:  Family History  Problem Relation Age of Onset   Breast cancer Paternal Aunt        dx in her 98s   Lymphoma Paternal Uncle    Breast cancer Paternal Grandmother        dx >50   Heart attack Paternal Grandfather    Breast cancer Other        2 maternal great aunts with breast cancer >50   Osteoporosis Mother        controlled with diet/exercise   COPD Father    Colon cancer Neg Hx    Colon polyps Neg Hx    Esophageal cancer Neg Hx    Rectal cancer Neg Hx    Stomach cancer Neg Hx     Social History:  Social History   Tobacco Use   Smoking status: Never   Smokeless tobacco: Never  Vaping Use   Vaping status: Never Used  Substance Use Topics   Alcohol  use: No   Drug use: No    Review of symptoms:  Constitutional:  Negative for  unexplained weight loss, night sweats, fever, chills ENT:  Negative for nose bleeds, sinus pain, painful swallowing CV:  Negative for chest pain, shortness of breath, exercise intolerance, palpitations, loss of consciousness Resp:  Negative for cough, wheezing, shortness of breath GI:  Negative for nausea, vomiting, diarrhea, bloody stools GU:  Positives noted in HPI; otherwise negative for gross hematuria, dysuria, urinary incontinence Neuro:  Negative for seizures, poor balance, limb weakness, slurred speech Psych:  Negative for lack of energy, depression, anxiety Endocrine:  Negative for polydipsia, polyuria, symptoms of hypoglycemia (dizziness, hunger, sweating) Hematologic:  Negative for anemia, purpura, petechia, prolonged or excessive bleeding, use of anticoagulants  Allergic:  Negative for difficulty breathing or choking as a result of exposure to anything; no shellfish allergy; no allergic response (rash/itch) to materials, foods  Physical exam: LMP 09/11/2012  GENERAL APPEARANCE:  Well appearing, well developed, well nourished, NAD HEENT: Atraumatic,  Normocephalic. NECK: Normal appearance LUNGS: Normal inspiratory and expiratory excursion HEART: Regular Rate EXTREMITIES: Moves all extremities well.  Without clubbing, cyanosis, or edema. NEUROLOGIC:  Alert and oriented x 3, normal gait, CN II-XII grossly intact.  MENTAL STATUS:  Appropriate. SKIN:  Warm, dry and intact.    Results:   I have reviewed referring/prior physicians records--[facility] records reviewed  I have reviewed urinalysis  I have reviewed multiple prior urine cultures   Assessment: Neurogenic bladder, currently reliant on CIC.  She does have some leakage in between her acute 4 to 6-hour catheterizations  Colonization with E. coli.  She is not symptomatic with the presence of pyuria-I think she has been over treated with antibiotics for colonization.  She is currently on trimethoprim  suppression as well as methenamine which is given twice a day   Plan: -I had a long discussion with the patient and her husband, who accompanied her today, regarding the fact that she is most likely colonized and does not need routine antibiotic management of this  -I would recommend that she continue on the methenamine, add vitamin C twice a day  -Stop trimethoprim   -Continue same catheterization schedule  -Because she does like in between catheterizations, I have offered her samples of Gemtesa for 2 weeks.  If this does help her leakage, she will let us  know and I will send in a prescription for long-term medical therapy  -I also recommended she add estrogen cream twice a day in the perivaginal area.  She does have some irritation in this area  -I will have her come back in 3 months to recheck, sooner with drop-in urinalyses if she feels like she truly has an infection  -Urine was cultured today  65 minutes spent reviewing records fro [facility], reviewing prior labs/cultures, UDS results, as well as face-to-face time w/ pt/husband

## 2024-04-01 ENCOUNTER — Ambulatory Visit: Admitting: Urology

## 2024-04-01 VITALS — BP 124/80 | HR 108 | Ht 65.0 in | Wt 167.0 lb

## 2024-04-01 DIAGNOSIS — N390 Urinary tract infection, site not specified: Secondary | ICD-10-CM

## 2024-04-01 DIAGNOSIS — Z22358 Carrier of other enterobacterales: Secondary | ICD-10-CM

## 2024-04-01 DIAGNOSIS — N319 Neuromuscular dysfunction of bladder, unspecified: Secondary | ICD-10-CM

## 2024-04-01 DIAGNOSIS — N952 Postmenopausal atrophic vaginitis: Secondary | ICD-10-CM

## 2024-04-01 DIAGNOSIS — N3942 Incontinence without sensory awareness: Secondary | ICD-10-CM

## 2024-04-01 LAB — URINALYSIS, ROUTINE W REFLEX MICROSCOPIC
Bilirubin, UA: NEGATIVE
Glucose, UA: NEGATIVE
Leukocytes,UA: NEGATIVE
Nitrite, UA: NEGATIVE
Protein,UA: NEGATIVE
RBC, UA: NEGATIVE
Specific Gravity, UA: 1.025 (ref 1.005–1.030)
Urobilinogen, Ur: 0.2 mg/dL (ref 0.2–1.0)
pH, UA: 5.5 (ref 5.0–7.5)

## 2024-04-01 LAB — MICROSCOPIC EXAMINATION

## 2024-04-01 MED ORDER — ESTRADIOL 0.1 MG/GM VA CREA: TOPICAL_CREAM | VAGINAL | 3 refills | Status: AC

## 2024-04-02 ENCOUNTER — Other Ambulatory Visit: Payer: Self-pay | Admitting: Family Medicine

## 2024-04-02 DIAGNOSIS — E034 Atrophy of thyroid (acquired): Secondary | ICD-10-CM

## 2024-04-03 ENCOUNTER — Ambulatory Visit: Payer: Self-pay | Admitting: Urology

## 2024-04-03 LAB — URINE CULTURE: Organism ID, Bacteria: NO GROWTH

## 2024-04-14 ENCOUNTER — Telehealth: Payer: Self-pay

## 2024-04-14 ENCOUNTER — Telehealth: Payer: Self-pay | Admitting: Urgent Care

## 2024-04-14 ENCOUNTER — Telehealth: Payer: Self-pay | Admitting: Urology

## 2024-04-14 DIAGNOSIS — E611 Iron deficiency: Secondary | ICD-10-CM

## 2024-04-14 DIAGNOSIS — R7303 Prediabetes: Secondary | ICD-10-CM

## 2024-04-14 DIAGNOSIS — E785 Hyperlipidemia, unspecified: Secondary | ICD-10-CM

## 2024-04-14 DIAGNOSIS — E034 Atrophy of thyroid (acquired): Secondary | ICD-10-CM

## 2024-04-14 DIAGNOSIS — E559 Vitamin D deficiency, unspecified: Secondary | ICD-10-CM

## 2024-04-14 NOTE — Telephone Encounter (Signed)
 Copied from CRM [id number]. Topic: Clinical - Medication Refill >> Apr 14, 2024  9:54 AM Amy B wrote: Medication: venlafaxine  XR (EFFEXOR -XR) 150 MG 24 hr capsule  Has the patient contacted their pharmacy? Yes (Agent: If no, request that the patient contact the pharmacy for the refill. If patient does not wish to contact the pharmacy document the reason why and proceed with request.) (Agent: If yes, when and what did the pharmacy advise?)  This is the patient's preferred pharmacy:  [facility] [id number] - [place], [place] - [address] [place] [address] Phone: [phone] Fax: [phone]  Is this the correct pharmacy for this prescription? Yes If no, delete pharmacy and type the correct one.   Has the prescription been filled recently? No  Is the patient out of the medication? Yes  Has the patient been seen for an appointment in the last year OR does the patient have an upcoming appointment? Yes  Can we respond through MyChart? No prefers phone call  Agent: Please be advised that Rx refills may take up to 3 business days. We ask that you follow-up with your pharmacy. >> Apr 14, 2024 10:04 AM Amy B wrote: Patient has an appointment scheduled on 10/30 and would like to know if she can have a refill of her venlafaxine  to bridge her until that time.

## 2024-04-14 NOTE — Telephone Encounter (Unsigned)
 Copied from CRM [id number]. Topic: Clinical - Medication Refill >> Apr 14, 2024  9:54 AM Amy B wrote: Medication: venlafaxine  XR (EFFEXOR -XR) 150 MG 24 hr capsule  Has the patient contacted their pharmacy? Yes (Agent: If no, request that the patient contact the pharmacy for the refill. If patient does not wish to contact the pharmacy document the reason why and proceed with request.) (Agent: If yes, when and what did the pharmacy advise?)  This is the patient's preferred pharmacy:  [facility] [id number] - [place], [place] - [address] [place] [address] Phone: [phone] Fax: [phone]  Is this the correct pharmacy for this prescription? Yes If no, delete pharmacy and type the correct one.   Has the prescription been filled recently? No  Is the patient out of the medication? Yes  Has the patient been seen for an appointment in the last year OR does the patient have an upcoming appointment? Yes  Can we respond through MyChart? No prefers phone call  Agent: Please be advised that Rx refills may take up to 3 business days. We ask that you follow-up with your pharmacy.

## 2024-04-14 NOTE — Telephone Encounter (Signed)
 Copied from CRM #[id number]. Topic: Clinical - Request for Lab/Test Order >> Apr 14, 2024 10:00 AM Amy B wrote: Reason for CRM: Patient has an upcoming appt on 10/30 and would like to know if she needs to have labs done prior to that, particularly a thyroid  test.  If so, please provider lab orders.

## 2024-04-14 NOTE — Telephone Encounter (Signed)
 Patient husband called about the sample that Dr. Matilda gave her Gemtesa. He stated the medication is working but it is not affordable and he stated he has a list of generics that he would like to go over with  someone to see if they would be effective please advise.

## 2024-04-15 ENCOUNTER — Other Ambulatory Visit: Payer: Self-pay

## 2024-04-15 ENCOUNTER — Telehealth: Payer: Self-pay | Admitting: Urology

## 2024-04-15 MED ORDER — VENLAFAXINE HCL ER 150 MG PO CP24: 150.0000 mg | ORAL_CAPSULE | Freq: Every day | ORAL | 0 refills | Status: DC

## 2024-04-15 NOTE — Telephone Encounter (Signed)
 Patient husband called about the sample that Dr. Matilda gave her Gemtesa. He stated the medication is working but it is not affordable and he stated he has a list of generics that he would like to go over with  someone to see if they would be effective please advise.

## 2024-04-16 NOTE — Telephone Encounter (Signed)
 [facility] requesting a return call.

## 2024-04-17 ENCOUNTER — Other Ambulatory Visit: Payer: Self-pay | Admitting: Urology

## 2024-04-17 DIAGNOSIS — N3942 Incontinence without sensory awareness: Secondary | ICD-10-CM

## 2024-04-17 MED ORDER — SOLIFENACIN SUCCINATE 10 MG PO TABS
10.0000 mg | ORAL_TABLET | Freq: Every day | ORAL | 11 refills | Status: AC
Start: 2024-04-17 — End: ?

## 2024-04-17 NOTE — Telephone Encounter (Signed)
 Spoke with pt and husband in reference to Gemtesa and other OAB medications. Husband stated insurance will cover oxybutynin, generic myrbetriq, and generic vesicare. Pt stated Louanne is working well, just not affordable. More samples provided. Please advise.

## 2024-04-20 NOTE — Telephone Encounter (Signed)
 Pt called and lvm stating Chelsea called and lvm. Please advise.

## 2024-04-22 ENCOUNTER — Encounter: Payer: Self-pay | Admitting: Neurosurgery

## 2024-04-22 ENCOUNTER — Ambulatory Visit (INDEPENDENT_AMBULATORY_CARE_PROVIDER_SITE_OTHER): Admitting: Neurosurgery

## 2024-04-22 VITALS — BP 134/82 | Ht 65.0 in | Wt 167.0 lb

## 2024-04-22 DIAGNOSIS — G9581 Conus medullaris syndrome: Secondary | ICD-10-CM | POA: Diagnosis not present

## 2024-04-22 DIAGNOSIS — M79605 Pain in left leg: Secondary | ICD-10-CM

## 2024-04-22 DIAGNOSIS — Z9682 Presence of neurostimulator: Secondary | ICD-10-CM

## 2024-04-22 NOTE — Progress Notes (Signed)
 Referring Physician:  Lowella Benton CROME, PA [address] [facility] [address], [address] [place],  [place] [address]  Primary Physician:  Lowella Benton CROME, [place]  History of Present Illness: 04/22/2024 Nancy Beard is here today to follow-up regarding continued left lower extremity pain.  She has had work with the previous stimulator team and has had some reprogramming.  She continues to have daily trouble with her lower extremity.  We worked her up for peripheral neuropathy or peripheral compression and were not able to find any.  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  In regards to her lower extremity pain her main issues now are below her leg circumferentially.  This can sometimes be on the bottom of her foot or on the side of her foot.  We are previously talking about approaching her from a peripheral nerve standpoint such as a sural neurectomy because she was having a significant on the cervical region pain however most of her symptoms seem to be in a sciatic distribution functionally.  She has not yet had any peripheral nerve injections for her sciatic nerve.  Conservative measures:  Physical therapy:  has not participated in PT Multimodal medical therapy including regular antiinflammatories: Tylenol , Celebrex, Oxycodone , Lyrica  Injections: no epidural steroid injections  Past Surgery: Dr Lonie at [facility] 08/30/2020 Spinal repair from fracture 03/2022 Dr, Davy, Dr. Myra removal of cartilage and freed the spinal fluid and widened the spinal canal.  The symptoms are causing a significant impact on the patient's life.   I have utilized the care everywhere function in epic to review the outside records available from external health systems.  Review of Systems:  A 10 point review of systems is negative, except for the pertinent positives and negatives detailed in the HPI.  Past  Medical History: Past Medical History:  Diagnosis Date   Anxiety    Blood transfusion without reported diagnosis 2014   Breast cancer (HCC) 2014   right, bil mast   Celiac disease    Depression    DIVERTICULOSIS, COLON 05/05/2007   DVT (deep venous thrombosis) (HCC) 2014   Right lower extremity, thigh   DVT (deep venous thrombosis) (HCC) 06/30/2013   August 2014. Continued Xarelto  due to persistent chronic DVT R popliteal vein. Plan repeat per oncology March 2016    Fracture of left tibial plateau 06/15/2015   Fracture tibia/fibula 06/14/2015   HEMORRHOIDS, EXTERNAL 05/05/2007   History of chemotherapy    Hypothyroidism    Incontinence    Neuropathy    of fingers and toes   Pneumonia    hx of years ago   Thyroid  disease    Vitamin D  insufficiency 06/20/2015   Wears glasses     Past Surgical History: Past Surgical History:  Procedure Laterality Date   AXILLARY SENTINEL NODE BIOPSY Right 09/11/2012   Procedure: AXILLARY SENTINEL lymph NODE  BIOPSY;  Surgeon: Krystal JINNY Russell, MD;  Location: [facility] OR;  Service: General;  Laterality: Right;  right nuclear medicine injection 12:30    BREAST LUMPECTOMY     benign lump   BREAST RECONSTRUCTION Right 09/24/2013   Procedure: REVISION OF RIGHT BREAST RECONSTRUCTION WITH REPOSITIONING RIGHT IMPLANT, POSSIBLE EXCISION CAPSULAR CONTRACTURE AND LIPOFILLING FOR FAT GRAFTING;  Surgeon: Estefana Reichert, DO;  Location: [facility];  Service: Plastics;  Laterality: Right;   BREAST RECONSTRUCTION WITH PLACEMENT OF TISSUE EXPANDER AND FLEX  HD (ACELLULAR HYDRATED DERMIS) Bilateral 09/11/2012   Procedure: BREAST RECONSTRUCTION WITH PLACEMENT OF TISSUE EXPANDER AND FLEX HD (ACELLULAR HYDRATED DERMIS) ADM;  Surgeon: Estefana Reichert, DO;  Location: [facility] OR;  Service: Plastics;  Laterality: Bilateral;   COLONOSCOPY  12/20/2004   [facility]   cryoblation of cervix     long time ago   ESOPHAGOGASTRODUODENOSCOPY  2007   sprue   EXTERNAL FIXATION LEG  Left 06/15/2015   Procedure: EXTERNAL FIXATION LEG;  Surgeon: Ozell Bruch, MD;  Location: [facility] OR;  Service: Orthopedics;  Laterality: Left;   INCISION AND DRAINAGE OF WOUND Left 09/16/2012   Procedure: Left Breast Evacuation of Hematoma;  Surgeon: Estefana Reichert, DO;  Location: [facility] OR;  Service: Plastics;  Laterality: Left;   LIPOSUCTION WITH LIPOFILLING Bilateral 09/24/2013   Procedure: LIPOSUCTION WITH LIPOFILLING;  Surgeon: Estefana Reichert, DO;  Location: [facility];  Service: Plastics;  Laterality: Bilateral;  Biltalteal filling breast   LIPOSUCTION WITH LIPOFILLING Bilateral 06/08/2020   Procedure: LIPOSUCTION WITH LIPOFILLING;  Surgeon: Lowery Estefana RAMAN, DO;  Location: [facility];  Service: Plastics;  Laterality: Bilateral;  90 min, please   ORIF TIBIA PLATEAU Left 06/21/2015   Procedure: OPEN REDUCTION INTERNAL FIXATION (ORIF) TIBIAL PLATEAU REMOVAL OF EXTERNAL FISTULA;  Surgeon: Ozell Bruch, MD;  Location: [facility] OR;  Service: Orthopedics;  Laterality: Left;   PORT-A-CATH REMOVAL Right 08/07/2013   Procedure: REMOVAL PORT-A-CATH;  Surgeon: Krystal JINNY Russell, MD;  Location: [facility];  Service: General;  Laterality: Right;   PORTACATH PLACEMENT Right 10/09/2012   Procedure: US  GUIDED INSERTION PORT-A-CATH;  Surgeon: Krystal JINNY Russell, MD;  Location: [facility];  Service: General;  Laterality: Right;  Right Subclavian Vein   REMOVAL OF BILATERAL TISSUE EXPANDERS WITH PLACEMENT OF BILATERAL BREAST IMPLANTS Bilateral 06/24/2013   Procedure: REMOVAL OF BILATERAL TISSUE EXPANDERS WITH PLACEMENT OF BILATERAL BREAST IMPLANTS;  Surgeon: Estefana Reichert, DO;  Location: [facility];  Service: Plastics;  Laterality: Bilateral;   TOTAL MASTECTOMY Bilateral 09/11/2012   Procedure: bilateral MASTECTOMY;  Surgeon: Krystal JINNY Russell, MD;  Location: [facility] OR;  Service: General;  Laterality: Bilateral;   VULVECTOMY N/A 07/14/2013   Procedure: WIDE  LOCAL  EXCISION VULVAR;  Surgeon: Toribio LITTIE Percy, MD;  Location: [facility];  Service: Gynecology;  Laterality: N/A;    Allergies: Allergies as of 04/22/2024 - Review Complete 04/22/2024  Allergen Reaction Noted   Ciprofloxacin  Hives, Itching, and Other (See Comments) 11/07/2012   Hydromorphone  hcl Itching 06/16/2015   Nitrofurantoin  Hives, Rash, and Other (See Comments) 07/01/2015   Tizanidine Other (See Comments) 05/03/2022   Gluten meal Other (See Comments) 09/05/2012   Tramadol  Other (See Comments) 04/26/2023   Melatonin Other (See Comments) 04/05/2021    Medications:  Current Outpatient Medications:    acetaminophen  (TYLENOL ) 325 MG tablet, Take by mouth., Disp: , Rfl:    AMBULATORY NON FORMULARY MEDICATION, BLOOD PRESSURE MONITOR - ARM CUFF PER PATIENT PREFERENCE / INSURANCE COVERAGE, DX: ELEVATED BLOOD PRESSURE, Disp: 1 Units, Rfl: 99   celecoxib (CELEBREX) 200 MG capsule, Take 200 mg by mouth 2 (two) times daily., Disp: , Rfl:    Cholecalciferol (VITAMIN D -3 PO), Take 1 tablet by mouth daily., Disp: , Rfl:    estradiol  (ESTRACE ) 0.1 MG/GM vaginal cream, Apply a pea-sized amount to vaginal opening using index finger 2-3 nights/week, Disp: 42.5 g, Rfl: 3   levothyroxine  (SYNTHROID ) 112 MCG tablet, TAKE 1 TABLET BY MOUTH DAILY BEFORE BREAKFAST., Disp: 90 tablet, Rfl:  0   pregabalin (LYRICA) 100 MG capsule, Take 100 mg by mouth 3 (three) times daily as needed., Disp: , Rfl:    prochlorperazine  (COMPAZINE ) 5 MG tablet, Take 1-2 tablets (5-10 mg total) by mouth every 6 (six) hours as needed for nausea or vomiting., Disp: 240 tablet, Rfl: 1   solifenacin (VESICARE) 10 MG tablet, Take 1 tablet (10 mg total) by mouth daily., Disp: 30 tablet, Rfl: 11   trimethoprim  (TRIMPEX ) 100 MG tablet, Take 1 tablet (100 mg total) by mouth daily. (UTI PREVENTION), Disp: 30 tablet, Rfl: 0   venlafaxine  XR (EFFEXOR -XR) 150 MG 24 hr capsule, Take 1 capsule (150 mg total) by mouth daily with  breakfast., Disp: 90 capsule, Rfl: 0  Social History: Social History   Tobacco Use   Smoking status: Never   Smokeless tobacco: Never  Vaping Use   Vaping status: Never Used  Substance Use Topics   Alcohol  use: No   Drug use: No    Family Medical History: Family History  Problem Relation Age of Onset   Breast cancer Paternal Aunt        dx in her 47s   Lymphoma Paternal Uncle    Breast cancer Paternal Grandmother        dx >50   Heart attack Paternal Grandfather    Breast cancer Other        2 maternal great aunts with breast cancer >50   Osteoporosis Mother        controlled with diet/exercise   COPD Father    Colon cancer Neg Hx    Colon polyps Neg Hx    Esophageal cancer Neg Hx    Rectal cancer Neg Hx    Stomach cancer Neg Hx     Physical Examination: Vitals:   04/22/24 0957  BP: 134/82    General: Patient is in no apparent distress. Attention to examination is appropriate.  Neck:   Supple.  Full range of motion.  Respiratory: Patient is breathing without any difficulty.   NEUROLOGICAL:     Awake, alert, oriented to person, place, and time.  Speech is clear and fluent.   Cranial Nerves: Pupils equal round and reactive to light.  Facial tone is symmetric.  Facial sensation is symmetric. Shoulder shrug is symmetric. Tongue protrusion is midline.    Strength: Per family report she originally had no function of her left lower extremity, but however now has improved function with antigravity movements throughout.  She does have some decreased quality of movement in the left lower extremity compared to the right.  Imaging: MRIs were been reviewed and I personally evaluated.  Shows decompression of her conus medullaris with interval fusion. I have personally reviewed the images and agree with the above interpretation.  There does appear to be a possible  pseudomeningocele on the left at approximately the L1 level.  [facility]  [address] [place], [address]  [place], [place] [address] Tel: [phone] Fax: [phone] Test Date:  10/22/2023   Patient: Nancy Beard DOB: 1955-12-04 Physician: Venetia Potters, MD  Sex: Female Height: 5' 6 Ref Phys: Penne Sharps, MD  ID#: [id number]     Technician:      History: This is a 68 year old female with left lower limb pain.   NCV & EMG Findings: Extensive electrodiagnostic evaluation of the left lower limb shows: Left sural and superficial peroneal/fibular sensory responses are within normal limits. Left peroneal/fibular (EDB) motor nerve shows evidence of a  normal anatomic variant, an accessory peroneal. When accounting for this, the left peroneal/fibular (EDB) motor response is within normal limits. Left tibial (AH) motor response is within normal limits. Left H reflex is absent. Chronic motor axon loss changes WITH accompanying active denervation changes are seen in the left medial head of gastrocnemius and short head of biceps femoris muscles. Chronic motor axon loss changes WITHOUT accompanying active denervation changes are seen in the left tibialis anterior, flexor digitorum longus, and gluteus medius muscles. Lumbosacral paraspinal muscles were deferred due to prior lumbosacral spine surgery.   Impression: This is an abnormal study. The findings are most consistent with the following: An active/ongoing intraspinal canal lesion (ie: motor radiculopathy) at the left S1 root or segment, moderate in degree electrically. The residuals of an old intraspinal canal lesion (ie: motor radiculopathy) at the left L5 root or segment, mild in degree electrically. No electrodiagnostic evidence of a large fiber sensorimotor neuropathy. No electrodiagnostic evidence of left tibial, peroneal/fibular, or sciatic mononeuropathy.       ___________________________ Venetia Potters, MD    [facility]  [address] [place], [address]  [place], [place] [address] Tel: [phone] Fax: [phone] Test Date:  10/22/2023   Patient: Nancy Beard DOB: 09-08-1955 Physician: Venetia Potters, MD  Sex: Female Height: 5' 6 Ref Phys: Penne Sharps, MD  ID#: [id number]     Technician:      History: This is a 68 year old female with left lower limb pain.   Findings: High frequency (4.0-16.0 MHz) B-mode, nonvascular ultrasound of the left lower limb shows: Cross sectional area of the left sciatic (popliteal fossa), peroneal/fibular (popliteal fossa to fibular head), and tibial (popliteal fossa) nerves are within normal limits.  The left lateral femoral cutaneous nerve could not be visualized nor was Tinel's sign present.  No other obvious lesion involving the adjacent bone or tendon is identified. No definite vascular abnormalities.   Impression: This is a normal neuromuscular ultrasound of the left lower limb. Specifically, there is no ultrasonographic evidence of entrapment or other focal pathology along the studied course of the left sciatic nerve (popliteal fossa), peroneal/fibular nerve (popliteal fossa to fibular head), or tibial nerve (popliteal fossa).   _______________   Venetia Potters, MD [facility]   Medical Decision Making/Assessment and Plan: Nancy Beard is a pleasant 68 y.o. 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  She has had difficulty with bowel and bladder incontinence ever since.  She has had a revision surgery for ongoing compression.  Unfortunately continues to have left lower extremity pain that is migratory, she does have a spinal cord stimulator placed and has had a recent adjustment and reprogramming which has given her some left-sided coverage, however this is not given her perfect level of relief.  She has had a recent adjustment of her spinal cord stimulator and at this point is mostly complaining of circumferential left lower extremity pain distal to the knee.  This seems to follow  mostly a sciatic distribution and in some cases it is exacerbated by pressure on her posterior thigh.  This can often get worse on certain hard chairs or on the toilet.  She will get shooting pains down her legs.  She has had an EMG which did not show any clear sciatic neuropathy however she does appear to have some excess sensitivity and allodynia in this distribution.  I will plan to reach out to my partner Dr. Marcelino to discuss  possible sciatic nerve stimulation to see whether or not this could help augment her current spinal stimulation.  This will likely involve the sciatic region block but I will defer to him for further evaluation and management.  Thank you for involving me in the care of this patient.    Penne MICAEL Sharps MD/MSCR Neurosurgery   I spent a total of 30 minutes with this patient today, reviewing her outside records, evaluating her face-to-face, going over her imaging and, coordination of her care going forward, and documentation.

## 2024-04-23 NOTE — Telephone Encounter (Signed)
 Spoke with pt who stated she no longer needed anything.

## 2024-04-24 ENCOUNTER — Ambulatory Visit: Payer: Self-pay | Admitting: Urgent Care

## 2024-04-24 DIAGNOSIS — E559 Vitamin D deficiency, unspecified: Secondary | ICD-10-CM

## 2024-04-24 LAB — COMPREHENSIVE METABOLIC PANEL WITH GFR
ALT: 38 IU/L — ABNORMAL HIGH (ref 0–32)
AST: 32 IU/L (ref 0–40)
Albumin: 4.4 g/dL (ref 3.9–4.9)
Alkaline Phosphatase: 142 IU/L — ABNORMAL HIGH (ref 49–135)
BUN/Creatinine Ratio: 16 (ref 12–28)
BUN: 12 mg/dL (ref 8–27)
Bilirubin Total: 0.4 mg/dL (ref 0.0–1.2)
CO2: 23 mmol/L (ref 20–29)
Calcium: 9.2 mg/dL (ref 8.7–10.3)
Chloride: 102 mmol/L (ref 96–106)
Creatinine, Ser: 0.77 mg/dL (ref 0.57–1.00)
Globulin, Total: 2.4 g/dL (ref 1.5–4.5)
Glucose: 96 mg/dL (ref 70–99)
Potassium: 4.7 mmol/L (ref 3.5–5.2)
Sodium: 140 mmol/L (ref 134–144)
Total Protein: 6.8 g/dL (ref 6.0–8.5)
eGFR: 83 mL/min/1.73 (ref >59)

## 2024-04-24 LAB — CBC WITH DIFFERENTIAL/PLATELET
Basophils Absolute: 0.1 x10E3/uL (ref 0.0–0.2)
Basos: 1 %
EOS (ABSOLUTE): 0.2 x10E3/uL (ref 0.0–0.4)
Eos: 4 %
Hematocrit: 44.6 % (ref 34.0–46.6)
Hemoglobin: 14.1 g/dL (ref 11.1–15.9)
Immature Grans (Abs): 0 x10E3/uL (ref 0.0–0.1)
Immature Granulocytes: 0 %
Lymphocytes Absolute: 3.2 x10E3/uL — ABNORMAL HIGH (ref 0.7–3.1)
Lymphs: 54 %
MCH: 28.5 pg (ref 26.6–33.0)
MCHC: 31.6 g/dL (ref 31.5–35.7)
MCV: 90 fL (ref 79–97)
Monocytes Absolute: 0.6 x10E3/uL (ref 0.1–0.9)
Monocytes: 11 %
Neutrophils Absolute: 1.8 x10E3/uL (ref 1.4–7.0)
Neutrophils: 30 %
Platelets: 275 x10E3/uL (ref 150–450)
RBC: 4.94 x10E6/uL (ref 3.77–5.28)
RDW: 14.6 % (ref 11.7–15.4)
WBC: 5.9 x10E3/uL (ref 3.4–10.8)

## 2024-04-24 LAB — IRON,TIBC AND FERRITIN PANEL
Ferritin: 34 ng/mL (ref 15–150)
Iron Saturation: 17 % (ref 15–55)
Iron: 62 ug/dL (ref 27–139)
Total Iron Binding Capacity: 356 ug/dL (ref 250–450)
UIBC: 294 ug/dL (ref 118–369)

## 2024-04-24 LAB — LIPID PANEL
Chol/HDL Ratio: 5.1 ratio — ABNORMAL HIGH (ref 0.0–4.4)
Cholesterol, Total: 220 mg/dL — ABNORMAL HIGH (ref 100–199)
HDL: 43 mg/dL (ref >39)
LDL Chol Calc (NIH): 144 mg/dL — ABNORMAL HIGH (ref 0–99)
Triglycerides: 182 mg/dL — ABNORMAL HIGH (ref 0–149)
VLDL Cholesterol Cal: 33 mg/dL (ref 5–40)

## 2024-04-24 LAB — VITAMIN D 25 HYDROXY (VIT D DEFICIENCY, FRACTURES): Vit D, 25-Hydroxy: 16.7 ng/mL — ABNORMAL LOW (ref 30.0–100.0)

## 2024-04-24 LAB — HEMOGLOBIN A1C
Est. average glucose Bld gHb Est-mCnc: 120 mg/dL
Hgb A1c MFr Bld: 5.8 % — ABNORMAL HIGH (ref 4.8–5.6)

## 2024-04-24 LAB — TSH+FREE T4
Free T4: 1.25 ng/dL (ref 0.82–1.77)
TSH: 6.37 u[IU]/mL — ABNORMAL HIGH (ref 0.450–4.500)

## 2024-04-24 MED ORDER — VITAMIN D (ERGOCALCIFEROL) 1.25 MG (50000 UNIT) PO CAPS: 50000.0000 [IU] | ORAL_CAPSULE | ORAL | 0 refills | Status: DC

## 2024-05-13 ENCOUNTER — Ambulatory Visit
Attending: Student in an Organized Health Care Education/Training Program | Admitting: Student in an Organized Health Care Education/Training Program

## 2024-05-13 VITALS — BP 145/90 | HR 99 | Temp 97.2°F | Resp 16 | Ht 64.0 in | Wt 167.0 lb

## 2024-05-13 DIAGNOSIS — M5430 Sciatica, unspecified side: Secondary | ICD-10-CM | POA: Insufficient documentation

## 2024-05-13 DIAGNOSIS — M79605 Pain in left leg: Secondary | ICD-10-CM | POA: Insufficient documentation

## 2024-05-13 DIAGNOSIS — Z9689 Presence of other specified functional implants: Secondary | ICD-10-CM | POA: Insufficient documentation

## 2024-05-13 DIAGNOSIS — G894 Chronic pain syndrome: Secondary | ICD-10-CM | POA: Insufficient documentation

## 2024-05-13 NOTE — Patient Instructions (Addendum)
 Give pt [facility] brochure Central sensitization- read further about it  Procedure instructions  Do not eat or drink fluids (other than water) for 6 hours before your procedure  No water for 2 hours before your procedure  Take your blood pressure medicine with a sip of water  Arrive 30 minutes before your appointment  Carefully read the Preparing for your procedure detailed instructions  If you have questions call us  at [phone]  _____________________________________________________________________    ______________________________________________________________________  Preparing for your procedure  Appointments: If you think you may not be able to keep your appointment, call 24-48 hours in advance to cancel. We need time to make it available to others.  During your procedure appointment there will be: No Prescription Refills. No disability issues to discussed. No medication changes or discussions.  Instructions: Food intake: Avoid eating anything solid for at least 8 hours prior to your procedure. Clear liquid intake: You may take clear liquids such as water up to 2 hours prior to your procedure. (No carbonated drinks. No soda.) Transportation: Unless otherwise stated by your physician, bring a driver. Morning Medicines: Except for blood thinners, take all of your other morning medications with a sip of water. Make sure to take your heart and blood pressure medicines. If your blood pressure's lower number is above 100, the case will be rescheduled. Blood thinners: Make sure to stop your blood thinners as instructed.  If you take a blood thinner, but were not instructed to stop it, call our office [phone] and ask to talk to a nurse. Not stopping a blood thinner prior to certain procedures could lead to serious complications. Diabetics on insulin: Notify the staff so that you can be scheduled 1st case in the morning. If your diabetes requires high dose insulin,  take only  of your normal insulin dose the morning of the procedure and notify the staff that you have done so. Preventing infections: Shower with an antibacterial soap the morning of your procedure.  Build-up your immune system: Take 1000 mg of Vitamin C with every meal (3 times a day) the day prior to your procedure. Antibiotics: Inform the nursing staff if you are taking any antibiotics or if you have any conditions that may require antibiotics prior to procedures. (Example: recent joint implants)   Pregnancy: If you are pregnant make sure to notify the nursing staff. Not doing so may result in injury to the fetus, including death.  Sickness: If you have a cold, fever, or any active infections, call and cancel or reschedule your procedure. Receiving steroids while having an infection may result in complications. Arrival: You must be in the facility at least 30 minutes prior to your scheduled procedure. Tardiness: Your scheduled time is also the cutoff time. If you do not arrive at least 15 minutes prior to your procedure, you will be rescheduled.  Children: Do not bring any children with you. Make arrangements to keep them home. Dress appropriately: There is always a possibility that your clothing may get soiled. Avoid long dresses. Valuables: Do not bring any jewelry or valuables.  Reasons to call and reschedule or cancel your procedure: (Following these recommendations will minimize the risk of a serious complication.) Surgeries: Avoid having procedures within 2 weeks of any surgery. (Avoid for 2 weeks before or after any surgery). Flu Shots: Avoid having procedures within 2 weeks of a flu shots or . (Avoid for 2 weeks before or after immunizations). Barium: Avoid having a procedure within 7-10 days  after having had a radiological study involving the use of radiological contrast. (Myelograms, Barium swallow or enema study). Heart attacks: Avoid any elective procedures or surgeries for the  initial 6 months after a Myocardial Infarction (Heart Attack). Blood thinners: It is imperative that you stop these medications before procedures. Let us  know if you if you take any blood thinner.  Infection: Avoid procedures during or within two weeks of an infection (including chest colds or gastrointestinal problems). Symptoms associated with infections include: Localized redness, fever, chills, night sweats or profuse sweating, burning sensation when voiding, cough, congestion, stuffiness, runny nose, sore throat, diarrhea, nausea, vomiting, cold or Flu symptoms, recent or current infections. It is specially important if the infection is over the area that we intend to treat. Heart and lung problems: Symptoms that may suggest an active cardiopulmonary problem include: cough, chest pain, breathing difficulties or shortness of breath, dizziness, ankle swelling, uncontrolled high or unusually low blood pressure, and/or palpitations. If you are experiencing any of these symptoms, cancel your procedure and contact your primary care physician for an evaluation.  Remember:  Regular Business hours are:  Monday to Thursday 8:00 AM to 4:00 PM  Provider's Schedule: Eric Como, MD:  Procedure days: Tuesday and Thursday 7:30 AM to 4:00 PM  Wallie Sherry, MD:  Procedure days: Monday and Wednesday 7:30 AM to 4:00 PM

## 2024-05-13 NOTE — Progress Notes (Signed)
 Safety precautions to be maintained throughout the outpatient stay will include: orient to surroundings, keep bed in low position, maintain call bell within reach at all times, provide assistance with transfer out of bed and ambulation.

## 2024-05-13 NOTE — Progress Notes (Unsigned)
 PROVIDER NOTE: Interpretation of information contained herein should be left to medically-trained personnel. Specific patient instructions are provided elsewhere under Patient Instructions section of medical record. This document was created in part using AI and STT-dictation technology, any transcriptional errors that may result from this process are unintentional.  Patient: Nancy Beard  Service: E/M Encounter  Provider: Wallie Sherry, MD  DOB: [birth year]  Delivery: Face-to-face  Specialty: Interventional Pain Management  MRN: [id number]  Setting: Ambulatory outpatient facility  Specialty designation: 09  Type: New Patient  Location: Outpatient office facility  PCP: Lowella Benton [place], [place]  DOS: 05/13/2024    Referring Prov.: Claudene Penne ORN, MD   Primary Reason(s) for Visit: Encounter for initial evaluation of one or more chronic problems (new to examiner) potentially causing chronic pain, and posing a threat to normal musculoskeletal function. (Level of risk: High) CC: Leg Pain (LEFT )  HPI  Nancy Beard is a 68 y.o. year old, female patient, who comes for the first time to our practice referred by Claudene Penne ORN, MD for our initial evaluation of her chronic pain. She has Hypothyroidism; Hyperlipidemia; Depression; Celiac disease/sprue; Primary cancer of lower-inner quadrant of right female breast (HCC); Osteoporosis; Vulvar intraepithelial neoplasia III (VIN III); Hot flashes related to aromatase inhibitor therapy; Persistent headaches; Anticoagulant long-term use; Genital herpes; Neuropathy; Vitamin D  deficiency; Recurrent UTI; Iron deficiency; Abnormal transaminases; Acquired absence of bilateral breasts and nipples; Herpes simplex; Status post bilateral breast reconstruction; Elevated liver enzymes; Prediabetes; History of high cholesterol; Screening for HIV (human immunodeficiency virus); Need for vaccination with 13-polyvalent pneumococcal conjugate vaccine; Need for influenza vaccination;  Screening for colon cancer; Acute deep vein thrombosis (DVT) of proximal vein of right lower extremity (HCC) see US  10/20/20- Xarelto  started 10/20/20; Closed avulsion fracture of distal fibula, left, initial encounter; L1 burst fracture with instrumentation and spinal cord compression; Elevated blood pressure reading; Chills; Left leg pain; Recurrent major depressive disorder, in partial remission; Conus medullaris syndrome (HCC); Neurogenic bladder disorder; Self-catheterizes urinary bladder; Sciatic leg pain; and Chronic pain syndrome on their problem list. Today she comes in for evaluation of her Leg Pain (LEFT )  Pain Assessment: Location: Left (LOWER) Leg Radiating:   Onset: More than a month ago Duration: Chronic pain Quality: Aching, Burning, Shooting, Spasm, Discomfort, Crying, Cramping (CHEWING) Severity: 5 /10 (subjective, self-reported pain score)  Effect on ADL: ADL;S, PROLONGED WALKING STANDING, i CANT DO A LOT OF ANYTHING Timing: Constant Modifying factors: MEDICATIONS, REST BP: (!) 145/90  HR: 99  Onset and Duration: Sudden Cause of pain: Work related accident or event Severity: Getting worse, NAS-11 at its worse: 10/10, NAS-11 at its best: 2/10, NAS-11 now: 6/10, and NAS-11 on the average: 5/10 Timing: Not influenced by the time of the day Aggravating Factors: Climbing, Lifiting, Motion, Prolonged sitting, and Prolonged standing Alleviating Factors: Stretching, Lying down, Medications, Resting, TENS, Using a brace, and Chiropractic manipulations Associated Problems: Constipation, Inability to control bladder (urine), Inability to control bowel, Numbness, Sweating, and Pain that wakes patient up Quality of Pain: Agonizing, Annoying, Disabling, Exhausting, and Horrible Previous Examinations or Tests: CT scan, MRI scan, Spinal tap, X-rays, Nerve conduction test, Orthopedic evaluation, Chiropractic evaluation, and The patient denies PNS Previous Treatments: Chiropractic  manipulations, Spinal cord stimulator, Strengthening exercises, and TENS  [name] is being evaluated for possible interventional pain management therapies for the treatment of her chronic pain.  Discussed the use of AI scribe software for clinical note transcription with the patient, who gave verbal consent to proceed.  History of Present Illness   Nancy Beard is a 68 year old female who presents with chronic left leg pain following a fall. She was referred by Dr. Penne Sharps for further evaluation of her chronic pain.  She has been experiencing chronic left leg pain since a fall on August 29, 2020, which resulted in a broken back and paralysis below the knee. The pain is sharp, shooting, and gnawing, primarily affecting the area from the knee to the ankle, with occasional radiation up to the hip. Involuntary movements in her foot and constant gnawing pain are present, with episodes of lightning pain that can be debilitating.  She underwent a [place] implant procedure to manage the pain, which has been reprogrammed multiple times and upgraded, but has only provided about 20% relief. She has also had two injections (once of which was a caudal), neither of which provided relief, even with lidocaine .  Her pain is exacerbated by cold temperatures and certain physical activities, such as sitting on a commode or being touched, which can trigger severe pain episodes. She also experiences central sensitization, where even light touch can provoke pain.  She has a history of spinal surgeries, including a second surgery to correct issues from the first, which involved restoring spinal fluid flow and removing a piece of cartilage. Despite these interventions, her pain remains severe and unpredictable, often bringing her to tears.  She is currently exploring alternative therapies, including seeing a chiropractor who focuses on brain and balance work, which has shown some improvement in her  gait and balance.       Meds   Current Outpatient Medications:    acetaminophen  (TYLENOL ) 325 MG tablet, Take by mouth., Disp: , Rfl:    AMBULATORY NON FORMULARY MEDICATION, BLOOD PRESSURE MONITOR - ARM CUFF PER PATIENT PREFERENCE / INSURANCE COVERAGE, DX: ELEVATED BLOOD PRESSURE, Disp: 1 Units, Rfl: 99   celecoxib (CELEBREX) 200 MG capsule, Take 200 mg by mouth 2 (two) times daily., Disp: , Rfl:    Cholecalciferol (VITAMIN D -3 PO), Take 1 tablet by mouth daily., Disp: , Rfl:    estradiol  (ESTRACE ) 0.1 MG/GM vaginal cream, Apply a pea-sized amount to vaginal opening using index finger 2-3 nights/week, Disp: 42.5 g, Rfl: 3   levothyroxine  (SYNTHROID ) 112 MCG tablet, TAKE 1 TABLET BY MOUTH DAILY BEFORE BREAKFAST., Disp: 90 tablet, Rfl: 0   pregabalin (LYRICA) 100 MG capsule, Take 100 mg by mouth 3 (three) times daily as needed. (Patient taking differently: Take 100 mg by mouth 4 (four) times daily.), Disp: , Rfl:    prochlorperazine  (COMPAZINE ) 5 MG tablet, Take 1-2 tablets (5-10 mg total) by mouth every 6 (six) hours as needed for nausea or vomiting., Disp: 240 tablet, Rfl: 1   solifenacin (VESICARE) 10 MG tablet, Take 1 tablet (10 mg total) by mouth daily., Disp: 30 tablet, Rfl: 11   trimethoprim  (TRIMPEX ) 100 MG tablet, Take 1 tablet (100 mg total) by mouth daily. (UTI PREVENTION), Disp: 30 tablet, Rfl: 0   venlafaxine  XR (EFFEXOR -XR) 150 MG 24 hr capsule, Take 1 capsule (150 mg total) by mouth daily with breakfast., Disp: 90 capsule, Rfl: 0   Vitamin D , Ergocalciferol , (DRISDOL ) 1.25 MG (50000 UNIT) CAPS capsule, Take 1 capsule (50,000 Units total) by mouth every 7 (seven) days., Disp: 12 capsule, Rfl: 0  Imaging Review   Lumbosacral Imaging: Lumbar MR wo contrast: Results for orders placed in visit on 12/18/20  MR LUMBAR SPINE WO CONTRAST  Narrative CLINICAL DATA:  Neck pain. Increasing  left leg pain. History of L1 burst fracture February 2022. Status post  thoracolumbar stabilization/fusion.  EXAM: MRI LUMBAR SPINE WITHOUT CONTRAST  TECHNIQUE: Multiplanar, multisequence MR imaging of the lumbar spine was performed. No intravenous contrast was administered.  COMPARISON:  None.  FINDINGS: Segmentation: There are five lumbar type vertebral bodies. The last full intervertebral disc space is labeled L5-S1.  Alignment:  The overall alignment is maintained.  Vertebrae: Evidence of a significant burst fracture at L1 with retropulsion and canal narrowing. 30 to 40% canal encroachment. Recommend correlation with any prior studies. Persistent signal abnormality suggesting a subacute process. There are pedicle screws and posterior rods above and below the L1 vertebral body. No complicating features are identified.  Conus medullaris and cauda equina: Conus extends to the L1-2 level. Conus and cauda equina appear normal.  Paraspinal and other soft tissues: No significant paraspinal findings.  Disc levels:  T12-L1: Pedicle screws in the T12 vertebral body. No complicating features. No significant spinal or foraminal stenosis.  L1-2: Burst fracture of L1 as detailed above. No significant spinal stenosis at the disc space level.  L2-3: Pedicle screws in L2 and L3.  The disc spaces normal.  L3-4, L4-5 and L5-S1 are unremarkable. No disc protrusions, spinal or foraminal stenosis. 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  IMPRESSION: 1. Burst fracture of L1 with retropulsion and canal narrowing (estimated at 30-40%). Recommend correlation with any prior studies. 2. Thoracolumbar fusion hardware without complicating features. 3. No disc protrusions or foraminal stenosis.   Electronically Signed By: MYRTIS Stammer M.D. On: 12/18/2020 15:53  CT Knee Left Wo Contrast  Narrative CLINICAL DATA:  Status post fall downstairs 06/14/2015 with a left tibial plateau fracture. Preoperative evaluation. Initial encounter.  EXAM: CT OF THE LEFT KNEE  WITHOUT CONTRAST  TECHNIQUE: Multidetector CT imaging of the left knee was performed according to the standard protocol. Multiplanar CT image reconstructions were also generated.  COMPARISON:  Plain films left knee 06/15/2015. Intraoperative views related to external fixator placement 06/15/2015.  FINDINGS: The patient has a comminuted tibial plateau fracture. The fracture includes a nondisplaced component which extends from the lateral tibial eminence in an inferior and medial orientation through the medial tibial metaphysis approximately 3 cm above the medial tibial plateau. There is also a nondisplaced component of the fracture which extends through the anterior margin of the medial tibial plateau.  The lateral 2.2 cm of the lateral tibial plateau is divided into 2 main fragments. The more posterior fragment measures 2 x 2 cm at the articular surface. Its posterior margin is inferiorly angulated approximately 15 degrees and depressed up to 1 cm at its posterior margin. The anterior component of the fracture is nondisplaced. The lateral component exits the tibial diaphysis 6 cm below the articular surface of the lateral tibial plateau where a floating fragment of cortex measuring approximately 4.7 cm craniocaudal by 1.7 cm AP is seen. No other fracture is identified.  Lipohemarthrosis in association with the patient's fracture is noted. The anterior and posterior cruciate ligaments and medial and lateral collateral ligament complex appear intact.  IMPRESSION: Comminuted lateral tibial plateau fracture with depression of a segment of the posterior articular surface as described above. The fracture also has a nondisplaced component through the medial tibial plateau. Comminuted and mildly displaced fibular head and neck fracture also noted.  3-dimensional CT images were rendered by post-processing of the original CT data at the CT scanner. The 3-dimensional CT images  were interpreted, and findings were reported in the accompanying  complete CT report for this study.   Electronically Signed By: Debby Prader M.D. On: 06/16/2015 07:40  Narrative CLINICAL DATA:  External fixation for left knee fracture.  EXAM: DG C-ARM 61-120 MIN; LEFT KNEE - 3 VIEW  COMPARISON:  June 15, 2015 9:28 a.m.  FINDINGS: Fractures of the proximal tibia are identify, less displaced compared to pre fixation films. There is fracture of the proximal fibula probably not significantly changed compared to pre fixation film.  IMPRESSION: Fractures of the proximal tibia are identify, less displaced compared to pre fixation films.   Electronically Signed By: Craig Farr M.D. On: 06/15/2015 15:40  Knee-R DG 4 views: No results found for this or any previous visit.  Knee-L DG 4 views: Results for orders placed during the hospital encounter of 06/14/15  DG Knee Complete 4 Views Left  Narrative CLINICAL DATA:  ORIF.  EXAM: DG C-ARM 61-120 MIN; LEFT KNEE - COMPLETE 4+ VIEW  COMPARISON:  CT 06/15/2015 .  FINDINGS: ORIF tibial plateau fracture with good anatomic alignment. Hardware intact. 3 images obtained. 1 minute 8 seconds fluoroscopy time.  IMPRESSION: ORIF left tibial plateau fracture.  Hardware intact.   Electronically Signed By: Debby  Register On: 06/21/2015 15:38   Narrative CLINICAL DATA:  Left ankle pain since a twisting injury stepping off a curb yesterday. Initial encounter.  EXAM: LEFT ANKLE COMPLETE - 3+ VIEW  COMPARISON:  None.  FINDINGS: There is lateral soft tissue swelling with a chip fracture off the distal most fibula. No other abnormality is identified.  IMPRESSION: Chip fractures off the distal fibula with associated soft tissue swelling.   Electronically Signed By: Debby Prader M.D. On: 11/16/2020 18:25   Foot Imaging: Foot-R DG Complete: No results found for this or any previous visit.  Foot-L DG  Complete: No results found for this or any previous visit.   Elbow Imaging: Elbow-R DG Complete: No results found for this or any previous visit.  Elbow-L DG Complete: No results found for this or any previous visit.   Wrist Imaging: Wrist-R DG Complete: No results found for this or any previous visit.  Wrist-L DG Complete: No results found for this or any previous visit.   Hand Imaging: Hand-R DG Complete: No results found for this or any previous visit.  Hand-L DG Complete: No results found for this or any previous visit.   Complexity Note: Imaging results reviewed.                         ROS  Cardiovascular: No reported cardiovascular signs or symptoms such as High blood pressure, coronary artery disease, abnormal heart rate or rhythm, heart attack, blood thinner therapy or heart weakness and/or failure Pulmonary or Respiratory: Snoring  Neurological: Incontinence:  Urinary and Fecal Psychological-Psychiatric: Depressed Gastrointestinal: Irregular, infrequent bowel movements (Constipation) Genitourinary: No reported renal or genitourinary signs or symptoms such as difficulty voiding or producing urine, peeing blood, non-functioning kidney, kidney stones, difficulty emptying the bladder, difficulty controlling the flow of urine, or chronic kidney disease Hematological: No reported hematological signs or symptoms such as prolonged bleeding, low or poor functioning platelets, bruising or bleeding easily, hereditary bleeding problems, low energy levels due to low hemoglobin or being anemic Endocrine: High thyroid  Rheumatologic: No reported rheumatological signs and symptoms such as fatigue, joint pain, tenderness, swelling, redness, heat, stiffness, decreased range of motion, with or without associated rash Musculoskeletal: Negative for myasthenia gravis, muscular dystrophy, multiple sclerosis or malignant hyperthermia Work History:  Retired  Allergies  Ms. Savell is allergic to  ciprofloxacin , hydromorphone  hcl, nitrofurantoin , tizanidine, gluten meal, tramadol , and melatonin.  Laboratory Chemistry Profile   Renal Lab Results  Component Value Date   BUN 12 04/23/2024   CREATININE 0.77 04/23/2024   BCR 16 04/23/2024   GFR 111.43 07/18/2012   GFRAA 108 03/23/2020   GFRNONAA 93 03/23/2020   SPECGRAV 1.025 04/01/2024   PHUR 5.5 04/01/2024   PROTEINUR Negative 04/01/2024     Electrolytes Lab Results  Component Value Date   NA 140 04/23/2024   K 4.7 04/23/2024   CL 102 04/23/2024   CALCIUM  9.2 04/23/2024   MG 2.2 05/21/2022   PHOS 3.2 06/15/2015     Hepatic Lab Results  Component Value Date   AST 32 04/23/2024   ALT 38 (H) 04/23/2024   ALBUMIN  4.4 04/23/2024   ALKPHOS 142 (H) 04/23/2024     ID Lab Results  Component Value Date   SARSCOV2NAA Not Detected 03/12/2022   STAPHAUREUS NEGATIVE 09/08/2012   MRSAPCR NEGATIVE 09/08/2012     Bone Lab Results  Component Value Date   VD25OH 16.7 (L) 04/23/2024   VD125OH2TOT 47 04/18/2021   [id number] 47 04/18/2021   [id number] <8 04/18/2021     Endocrine Lab Results  Component Value Date   GLUCOSE 96 04/23/2024   GLUCOSEU Negative 04/01/2024   HGBA1C 5.8 (H) 04/23/2024   TSH 6.370 (H) 04/23/2024   FREET4 1.25 04/23/2024     Neuropathy Lab Results  Component Value Date   VITAMINB12 238 03/23/2020   FOLATE 30.4 06/23/2015   HGBA1C 5.8 (H) 04/23/2024     CNS Lab Results  Component Value Date   COLORCSF COLORLESS 02/25/2014   APPEARCSF CLEAR 02/25/2014   RBCCOUNTCSF 2 (H) 02/25/2014   WBCCSF 1 02/25/2014   PROTEINCSF 29 02/25/2014   GLUCCSF 70 02/25/2014     Inflammation (CRP: Acute  ESR: Chronic) No results found for: CRP, ESRSEDRATE, LATICACIDVEN   Rheumatology No results found for: RF, ANA, LABURIC, URICUR, LYMEIGGIGMAB, LYMEABIGMQN, HLAB27   Coagulation Lab Results  Component Value Date   INR 0.9 08/12/2018   LABPROT 9.4 08/12/2018   APTT 24 08/12/2018    PLT 275 04/23/2024   DDIMER 0.27 09/28/2014   AT3 102 06/25/2015     Cardiovascular Lab Results  Component Value Date   HGB 14.1 04/23/2024   HCT 44.6 04/23/2024     Screening Lab Results  Component Value Date   SARSCOV2NAA Not Detected 03/12/2022   STAPHAUREUS NEGATIVE 09/08/2012   MRSAPCR NEGATIVE 09/08/2012     Cancer Lab Results  Component Value Date   LABCA2 31 07/02/2012     Allergens No results found for: ALMOND, APPLE, ASPARAGUS, AVOCADO, BANANA, BARLEY, BASIL, BAYLEAF, GREENBEAN, LIMABEAN, WHITEBEAN, BEEFIGE, REDBEET, BLUEBERRY, BROCCOLI, CABBAGE, MELON, CARROT, CASEIN, CASHEWNUT, CAULIFLOWER, CELERY     Note: Lab results reviewed.  PFSH  Drug: Ms. Brester  reports no history of drug use. Alcohol :  reports no history of alcohol  use. Tobacco:  reports that she has never smoked. She has never used smokeless tobacco. Medical:  has a past medical history of Anxiety, Blood transfusion without reported diagnosis (2014), Breast cancer (HCC) (2014), Celiac disease, Depression, DIVERTICULOSIS, COLON (05/05/2007), DVT (deep venous thrombosis) (HCC) (2014), DVT (deep venous thrombosis) (HCC) (06/30/2013), Fracture of left tibial plateau (06/15/2015), Fracture tibia/fibula (06/14/2015), HEMORRHOIDS, EXTERNAL (05/05/2007), History of chemotherapy, Hypothyroidism, Incontinence, Neuropathy, Pneumonia, Thyroid  disease, Vitamin D  insufficiency (06/20/2015), and Wears glasses. Family: family history includes Breast cancer in her  paternal aunt, paternal grandmother, and another family member; COPD in her father; Heart attack in her paternal grandfather; Lymphoma in her paternal uncle; Osteoporosis in her mother.  Past Surgical History:  Procedure Laterality Date   AXILLARY SENTINEL NODE BIOPSY Right 09/11/2012   Procedure: AXILLARY SENTINEL lymph NODE  BIOPSY;  Surgeon: Krystal JINNY Russell, MD;  Location: [facility] OR;  Service: General;  Laterality:  Right;  right nuclear medicine injection 12:30    BREAST LUMPECTOMY     benign lump   BREAST RECONSTRUCTION Right 09/24/2013   Procedure: REVISION OF RIGHT BREAST RECONSTRUCTION WITH REPOSITIONING RIGHT IMPLANT, POSSIBLE EXCISION CAPSULAR CONTRACTURE AND LIPOFILLING FOR FAT GRAFTING;  Surgeon: Estefana Reichert, DO;  Location: [facility];  Service: Plastics;  Laterality: Right;   BREAST RECONSTRUCTION WITH PLACEMENT OF TISSUE EXPANDER AND FLEX HD (ACELLULAR HYDRATED DERMIS) Bilateral 09/11/2012   Procedure: BREAST RECONSTRUCTION WITH PLACEMENT OF TISSUE EXPANDER AND FLEX HD (ACELLULAR HYDRATED DERMIS) ADM;  Surgeon: Estefana Reichert, DO;  Location: [facility] OR;  Service: Plastics;  Laterality: Bilateral;   COLONOSCOPY  12/20/2004   [place]   cryoblation of cervix     long time ago   ESOPHAGOGASTRODUODENOSCOPY  2007   sprue   EXTERNAL FIXATION LEG Left 06/15/2015   Procedure: EXTERNAL FIXATION LEG;  Surgeon: Ozell Bruch, MD;  Location: [facility] OR;  Service: Orthopedics;  Laterality: Left;   INCISION AND DRAINAGE OF WOUND Left 09/16/2012   Procedure: Left Breast Evacuation of Hematoma;  Surgeon: Estefana Reichert, DO;  Location: [facility] OR;  Service: Plastics;  Laterality: Left;   LIPOSUCTION WITH LIPOFILLING Bilateral 09/24/2013   Procedure: LIPOSUCTION WITH LIPOFILLING;  Surgeon: Estefana Reichert, DO;  Location: [facility];  Service: Plastics;  Laterality: Bilateral;  Biltalteal filling breast   LIPOSUCTION WITH LIPOFILLING Bilateral 06/08/2020   Procedure: LIPOSUCTION WITH LIPOFILLING;  Surgeon: Lowery Estefana RAMAN, DO;  Location: [facility];  Service: Plastics;  Laterality: Bilateral;  90 min, please   ORIF TIBIA PLATEAU Left 06/21/2015   Procedure: OPEN REDUCTION INTERNAL FIXATION (ORIF) TIBIAL PLATEAU REMOVAL OF EXTERNAL FISTULA;  Surgeon: Ozell Bruch, MD;  Location: [facility] OR;  Service: Orthopedics;  Laterality: Left;   PORT-A-CATH REMOVAL Right 08/07/2013   Procedure: REMOVAL  PORT-A-CATH;  Surgeon: Krystal JINNY Russell, MD;  Location: [facility];  Service: General;  Laterality: Right;   PORTACATH PLACEMENT Right 10/09/2012   Procedure: US  GUIDED INSERTION PORT-A-CATH;  Surgeon: Krystal JINNY Russell, MD;  Location: [facility];  Service: General;  Laterality: Right;  Right Subclavian Vein   REMOVAL OF BILATERAL TISSUE EXPANDERS WITH PLACEMENT OF BILATERAL BREAST IMPLANTS Bilateral 06/24/2013   Procedure: REMOVAL OF BILATERAL TISSUE EXPANDERS WITH PLACEMENT OF BILATERAL BREAST IMPLANTS;  Surgeon: Estefana Reichert, DO;  Location: [facility];  Service: Plastics;  Laterality: Bilateral;   TOTAL MASTECTOMY Bilateral 09/11/2012   Procedure: bilateral MASTECTOMY;  Surgeon: Krystal JINNY Russell, MD;  Location: [facility] OR;  Service: General;  Laterality: Bilateral;   VULVECTOMY N/A 07/14/2013   Procedure: WIDE LOCAL  EXCISION VULVAR;  Surgeon: Toribio LITTIE Percy, MD;  Location: [facility];  Service: Gynecology;  Laterality: N/A;   Active Ambulatory Problems    Diagnosis Date Noted   Hypothyroidism 01/31/2007   Hyperlipidemia 05/13/2008   Depression 01/31/2007   Celiac disease/sprue 01/31/2007   Primary cancer of lower-inner quadrant of right female breast (HCC) 06/24/2012   Osteoporosis 05/13/2013   Vulvar intraepithelial neoplasia III (VIN III) 06/30/2013   Hot flashes related to aromatase  inhibitor therapy 12/10/2013   Persistent headaches 02/22/2014   Anticoagulant long-term use 05/01/2014   Genital herpes 09/06/2014   Neuropathy 09/06/2014   Vitamin D  deficiency 06/20/2015   Recurrent UTI 06/22/2015   Iron deficiency 06/24/2015   Abnormal transaminases 06/27/2015   Acquired absence of bilateral breasts and nipples 09/23/2012   Herpes simplex 04/12/2017   Status post bilateral breast reconstruction 06/30/2013   Elevated liver enzymes 11/12/2018   Prediabetes 11/12/2018   History of high cholesterol 11/12/2018   Screening for HIV (human  immunodeficiency virus) 04/28/2020   Need for vaccination with 13-polyvalent pneumococcal conjugate vaccine 04/28/2020   Need for influenza vaccination 04/28/2020   Screening for colon cancer 04/28/2020   Acute deep vein thrombosis (DVT) of proximal vein of right lower extremity (HCC) see US  10/20/20- Xarelto  started 10/20/20 10/20/2020   Closed avulsion fracture of distal fibula, left, initial encounter 11/18/2020   L1 burst fracture with instrumentation and spinal cord compression 11/18/2020   Elevated blood pressure reading 02/06/2022   Chills 03/12/2022   Left leg pain 05/17/2022   Recurrent major depressive disorder, in partial remission 10/05/2022   Conus medullaris syndrome (HCC) 10/09/2023   Neurogenic bladder disorder 03/15/2024   Self-catheterizes urinary bladder 03/15/2024   Sciatic leg pain 05/13/2024   Chronic pain syndrome 05/13/2024   Resolved Ambulatory Problems    Diagnosis Date Noted   INSOMNIA, CHRONIC, MILD 05/13/2008   HEMORRHOIDS, EXTERNAL 05/05/2007   Acute sinusitis, unspecified 03/08/2009   DIVERTICULOSIS, COLON 05/05/2007   CYSTITIS, ACUTE 08/19/2008   Cough 12/05/2009   LIVER FUNCTION TESTS, ABNORMAL 01/31/2007   Carbuncle and furuncle of buttock 04/29/2012   DVT (deep venous thrombosis) (HCC) 06/30/2013   Worsening headaches 02/22/2014   Fracture tibia/fibula 06/14/2015   Fracture of left tibial plateau 06/15/2015   Breast cancer (HCC)    Bacterial UTI 06/24/2015   Thrombocytosis 06/27/2015   Swelling    Hyperglycemia    Intraductal carcinoma in situ of breast 04/12/2017   Osteopenia with pathologic fracture 04/28/2020   Breast asymmetry following reconstructive surgery 05/03/2020   Acute cystitis 12/05/2021   Acute cystitis without hematuria 02/06/2022   Abnormal urine odor 02/06/2022   Dysuria 10/05/2022   Past Medical History:  Diagnosis Date   Anxiety    Blood transfusion without reported diagnosis 2014   Celiac disease    DIVERTICULOSIS,  COLON 05/05/2007   HEMORRHOIDS, EXTERNAL 05/05/2007   History of chemotherapy    Incontinence    Pneumonia    Thyroid  disease    Vitamin D  insufficiency 06/20/2015   Wears glasses    Constitutional Exam  General appearance: Well nourished, well developed, and well hydrated. In no apparent acute distress Vitals:   05/13/24 1430  BP: (!) 145/90  Pulse: 99  Resp: 16  Temp: (!) 97.2 F (36.2 C)  SpO2: 99%  Weight: 167 lb (75.8 kg)  Height: 5' 4 (1.626 m)   BMI Assessment: Estimated body mass index is 28.67 kg/m as calculated from the following:   Height as of this encounter: 5' 4 (1.626 m).   Weight as of this encounter: 167 lb (75.8 kg).  BMI interpretation table: BMI level Category Range association with higher incidence of chronic pain  <18 kg/m2 Underweight   18.5-24.9 kg/m2 Ideal body weight   25-29.9 kg/m2 Overweight Increased incidence by 20%  30-34.9 kg/m2 Obese (Class I) Increased incidence by 68%  35-39.9 kg/m2 Severe obesity (Class II) Increased incidence by 136%  >40 kg/m2 Extreme obesity (Class III) Increased incidence  by 254%   Patient's current BMI Ideal Body weight  Body mass index is 28.67 kg/m. Ideal body weight: 54.7 kg (120 lb 9.5 oz) Adjusted ideal body weight: 63.1 kg (139 lb 2.5 oz)   BMI Readings from Last 4 Encounters:  05/13/24 28.67 kg/m  04/22/24 27.79 kg/m  04/01/24 27.79 kg/m  03/13/24 27.20 kg/m   Wt Readings from Last 4 Encounters:  05/13/24 167 lb (75.8 kg)  04/22/24 167 lb (75.8 kg)  04/01/24 167 lb (75.8 kg)  03/13/24 168 lb 8 oz (76.4 kg)    Psych/Mental status: Alert, oriented x 3 (person, place, & time)       Eyes: PERLA Respiratory: No evidence of acute respiratory distress  Assessment  Primary Diagnosis & Pertinent Problem List: The primary encounter diagnosis was Left leg pain. Diagnoses of Sciatic leg pain and Chronic pain syndrome were also pertinent to this visit.  Visit Diagnosis (New problems to  examiner): 1. Left leg pain   2. Sciatic leg pain   3. Chronic pain syndrome    Plan of Care (Initial workup plan)  Note: Ms. Authier was reminded that as per protocol, today's visit has been an evaluation only. We have not taken over the patient's controlled substance management.  Problem-specific plan: Assessment and Plan Assessment & Plan    Lab Orders  No laboratory test(s) ordered today   Imaging Orders  No imaging studies ordered today   Referral Orders  No referral(s) requested today   Procedure Orders         SCIATIC NERVE BLOCK     Pharmacotherapy (current): Medications ordered:  No orders of the defined types were placed in this encounter.  Medications administered during this visit: Zaydah A. Glahn Cindy had no medications administered during this visit.    Provider-requested follow-up: Return in about 12 days (around 05/25/2024) for Left popliteal sciatic block under US  guidance, in clinic (PO Valium  5mg ).  Future Appointments  Date Time Provider Department Center  05/14/2024 10:00 AM Lowella Benton [facility], [place] [facility]  07/01/2024 11:00 AM Matilda Senior, MD [facility] None   I discussed the assessment and treatment plan with the patient. The patient was provided an opportunity to ask questions and all were answered. The patient agreed with the plan and demonstrated an understanding of the instructions.  Patient advised to call back or seek an in-person evaluation if the symptoms or condition worsens.  Duration of encounter: *** minutes.  Total time on encounter, as per AMA guidelines included both the face-to-face and non-face-to-face time personally spent by the physician and/or other qualified health care professional(s) on the day of the encounter (includes time in activities that require the physician or other qualified health care professional and does not include time in activities normally performed by clinical staff). Physician's time may  include the following activities when performed: Preparing to see the patient (e.g., pre-charting review of records, searching for previously ordered imaging, lab work, and nerve conduction tests) Review of prior analgesic pharmacotherapies. Reviewing PMP Interpreting ordered tests (e.g., lab work, imaging, nerve conduction tests) Performing post-procedure evaluations, including interpretation of diagnostic procedures Obtaining and/or reviewing separately obtained history Performing a medically appropriate examination and/or evaluation Counseling and educating the patient/family/caregiver Ordering medications, tests, or procedures Referring and communicating with other health care professionals (when not separately reported) Documenting clinical information in the electronic or other health record Independently interpreting results (not separately reported) and communicating results to the patient/ family/caregiver Care coordination (not separately reported)  Note by:  Wallie Sherry, MD (TTS and AI technology used. I apologize for any typographical errors that were not detected and corrected.) Date: 05/13/2024; Time: 4:13 PM

## 2024-05-14 ENCOUNTER — Encounter: Payer: Self-pay | Admitting: Urgent Care

## 2024-05-14 ENCOUNTER — Ambulatory Visit (INDEPENDENT_AMBULATORY_CARE_PROVIDER_SITE_OTHER): Admitting: Urgent Care

## 2024-05-14 VITALS — BP 137/88 | HR 90 | Ht 64.0 in | Wt 168.0 lb

## 2024-05-14 DIAGNOSIS — F3341 Major depressive disorder, recurrent, in partial remission: Secondary | ICD-10-CM

## 2024-05-14 DIAGNOSIS — Z9689 Presence of other specified functional implants: Secondary | ICD-10-CM

## 2024-05-14 DIAGNOSIS — N390 Urinary tract infection, site not specified: Secondary | ICD-10-CM

## 2024-05-14 DIAGNOSIS — E034 Atrophy of thyroid (acquired): Secondary | ICD-10-CM | POA: Diagnosis not present

## 2024-05-14 DIAGNOSIS — M5430 Sciatica, unspecified side: Secondary | ICD-10-CM

## 2024-05-14 DIAGNOSIS — E559 Vitamin D deficiency, unspecified: Secondary | ICD-10-CM | POA: Diagnosis not present

## 2024-05-14 DIAGNOSIS — Z0000 Encounter for general adult medical examination without abnormal findings: Secondary | ICD-10-CM

## 2024-05-14 MED ORDER — TRIMETHOPRIM 100 MG PO TABS: 100.0000 mg | ORAL_TABLET | Freq: Every day | ORAL | 1 refills | Status: AC

## 2024-05-14 MED ORDER — VENLAFAXINE HCL ER 150 MG PO CP24: 150.0000 mg | ORAL_CAPSULE | Freq: Every day | ORAL | 1 refills | Status: AC

## 2024-05-14 MED ORDER — LEVOTHYROXINE SODIUM 112 MCG PO TABS: 112.0000 ug | ORAL_TABLET | Freq: Every day | ORAL | 1 refills | Status: DC

## 2024-05-14 NOTE — Progress Notes (Signed)
 Complete physical exam  Patient: Nancy Beard   DOB: 1955-12-22   68 y.o. Female  MRN: [id number]  Subjective:    Chief Complaint  Patient presents with   Annual Exam    Nancy Beard is a 68 y.o. female who presents today for a complete physical exam. She reports consuming a general and low in meat products diet. The patient does not participate in regular exercise at present. She generally feels fairly well. She reports sleeping 4 hours at a time due to needing to wake up for pain medication. She does have additional problems to discuss today.   Discussed the use of AI scribe software for clinical note transcription with the patient, who gave verbal consent to proceed.  History of Present Illness   Nancy Beard is a 68 year old female who presents for a yearly physical exam. She is accompanied by her mother.  She experiences significant pain in her left leg due to a previous fall affecting the sciatic nerve. The pain is exacerbated by cold weather and limits her physical activity, primarily attending doctor's appointments. She is scheduled for a nerve block on November 10th, as previous spinal injections and a spinal cord stimulator did not alleviate her pain. Her current pain management regimen includes Lyrica (pregabalin) four times daily, Celebrex twice daily, and oxycodone  5 mg every four hours. The pain disrupts her sleep, requiring her to wake up every four hours to take medication, with her husband setting an alarm to assist her. She follows with pain management for this.  She follows a vegetarian diet, primarily eating meals prepared by her husband. She reports a lack of physical activity due to her leg pain. She has been on levothyroxine  112 mcg for hypothyroidism for years, with recent labs showing a TSH of 6.3, improved from 14 in March. She denies severe fatigue, weight gain, dry skin, hair loss, or constipation.  She takes venlafaxine  (Effexor ) and  trimethoprim  daily for UTI prevention, which she finds effective. She also takes VESIcare (solifenacin) for urinary issues, which reduces the frequency of catheterization. Her vitamin D  levels were low, and she is on a weekly 50,000 unit supplement. She has a history of a double mastectomy and does not require mammograms.  She reports regular bowel movements with the use of over-the-counter Colace (docusate sodium ) and has no dental concerns. She recently saw an eye doctor with normal results.       Most recent fall risk assessment:    10/04/2023    4:05 PM  Fall Risk   Falls in the past year? 1  Number falls in past yr: 1  Injury with Fall? 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   Recurrent major depressive disorder, in partial remission 10/05/2022   Left leg pain 05/17/2022   Chills 03/12/2022  Elevated blood pressure reading 02/06/2022   Closed avulsion fracture of distal fibula, left, initial encounter 11/18/2020   L1 burst fracture with instrumentation and spinal cord compression 11/18/2020   Acute deep vein thrombosis (DVT) of proximal vein of right lower extremity (HCC) see US  10/20/20- Xarelto  started 10/20/20 10/20/2020   Screening for HIV (human immunodeficiency virus) 04/28/2020   Need for vaccination with 13-polyvalent pneumococcal conjugate vaccine 04/28/2020   Need for influenza vaccination 04/28/2020    Screening for colon cancer 04/28/2020   Elevated liver enzymes 11/12/2018   Prediabetes 11/12/2018   History of high cholesterol 11/12/2018   Herpes simplex 04/12/2017   Abnormal transaminases 06/27/2015   Iron deficiency 06/24/2015   Recurrent UTI 06/22/2015   Vitamin D  deficiency 06/20/2015   Genital herpes 09/06/2014   Neuropathy 09/06/2014   Anticoagulant long-term use 05/01/2014   Persistent headaches 02/22/2014   Hot flashes related to aromatase inhibitor therapy 12/10/2013   Vulvar intraepithelial neoplasia III (VIN III) 06/30/2013   Status post bilateral breast reconstruction 06/30/2013   Osteoporosis 05/13/2013   Acquired absence of bilateral breasts and nipples 09/23/2012   Primary cancer of lower-inner quadrant of right female breast (HCC) 06/24/2012   Hyperlipidemia 05/13/2008   Hypothyroidism 01/31/2007   Depression 01/31/2007   Celiac disease/sprue 01/31/2007   Past Medical History:  Diagnosis Date   Anxiety    Blood transfusion without reported diagnosis 2014   Breast cancer (HCC) 2014   right, bil mast   Celiac disease    Depression    DIVERTICULOSIS, COLON 05/05/2007   DVT (deep venous thrombosis) (HCC) 2014   Right lower extremity, thigh   DVT (deep venous thrombosis) (HCC) 06/30/2013   August 2014. Continued Xarelto  due to persistent chronic DVT R popliteal vein. Plan repeat per oncology March 2016    Fracture of left tibial plateau 06/15/2015   Fracture tibia/fibula 06/14/2015   HEMORRHOIDS, EXTERNAL 05/05/2007   History of chemotherapy    Hypothyroidism    Incontinence    Neuropathy    of fingers and toes   Pneumonia    hx of years ago   Thyroid  disease    Vitamin D  insufficiency 06/20/2015   Wears glasses    Past Surgical History:  Procedure Laterality Date   AXILLARY SENTINEL NODE BIOPSY Right 09/11/2012   Procedure: AXILLARY SENTINEL lymph NODE  BIOPSY;  Surgeon: Krystal JINNY Russell, MD;  Location: [facility] OR;  Service: General;  Laterality:  Right;  right nuclear medicine injection 12:30    BREAST LUMPECTOMY     benign lump   BREAST RECONSTRUCTION Right 09/24/2013   Procedure: REVISION OF RIGHT BREAST RECONSTRUCTION WITH REPOSITIONING RIGHT IMPLANT, POSSIBLE EXCISION CAPSULAR CONTRACTURE AND LIPOFILLING FOR FAT GRAFTING;  Surgeon: Estefana Reichert, DO;  Location: [facility];  Service: Plastics;  Laterality: Right;   BREAST RECONSTRUCTION WITH PLACEMENT OF TISSUE EXPANDER AND FLEX HD (ACELLULAR HYDRATED DERMIS) Bilateral 09/11/2012   Procedure: BREAST RECONSTRUCTION WITH PLACEMENT OF TISSUE EXPANDER AND FLEX HD (ACELLULAR HYDRATED DERMIS) ADM;  Surgeon: Estefana Reichert, DO;  Location: [facility] OR;  Service: Plastics;  Laterality: Bilateral;   COLONOSCOPY  12/20/2004   [facility]   cryoblation of cervix     long time ago   ESOPHAGOGASTRODUODENOSCOPY  2007   sprue   EXTERNAL FIXATION LEG Left 06/15/2015   Procedure: EXTERNAL FIXATION LEG;  Surgeon: Ozell Bruch, MD;  Location: [facility] OR;  Service: Orthopedics;  Laterality: Left;   INCISION AND DRAINAGE OF WOUND Left 09/16/2012   Procedure: Left Breast Evacuation  of Hematoma;  Surgeon: Estefana Reichert, DO;  Location: [facility] OR;  Service: Plastics;  Laterality: Left;   LIPOSUCTION WITH LIPOFILLING Bilateral 09/24/2013   Procedure: LIPOSUCTION WITH LIPOFILLING;  Surgeon: Estefana Reichert, DO;  Location: [facility];  Service: Plastics;  Laterality: Bilateral;  Biltalteal filling breast   LIPOSUCTION WITH LIPOFILLING Bilateral 06/08/2020   Procedure: LIPOSUCTION WITH LIPOFILLING;  Surgeon: Lowery Estefana RAMAN, DO;  Location: [facility];  Service: Plastics;  Laterality: Bilateral;  90 min, please   ORIF TIBIA PLATEAU Left 06/21/2015   Procedure: OPEN REDUCTION INTERNAL FIXATION (ORIF) TIBIAL PLATEAU REMOVAL OF EXTERNAL FISTULA;  Surgeon: Ozell Bruch, MD;  Location: [facility] OR;  Service: Orthopedics;  Laterality: Left;   PORT-A-CATH REMOVAL Right 08/07/2013   Procedure: REMOVAL  PORT-A-CATH;  Surgeon: Krystal JINNY Russell, MD;  Location: [facility];  Service: General;  Laterality: Right;   PORTACATH PLACEMENT Right 10/09/2012   Procedure: US  GUIDED INSERTION PORT-A-CATH;  Surgeon: Krystal JINNY Russell, MD;  Location: [facility];  Service: General;  Laterality: Right;  Right Subclavian Vein   REMOVAL OF BILATERAL TISSUE EXPANDERS WITH PLACEMENT OF BILATERAL BREAST IMPLANTS Bilateral 06/24/2013   Procedure: REMOVAL OF BILATERAL TISSUE EXPANDERS WITH PLACEMENT OF BILATERAL BREAST IMPLANTS;  Surgeon: Estefana Reichert, DO;  Location: [facility];  Service: Plastics;  Laterality: Bilateral;   TOTAL MASTECTOMY Bilateral 09/11/2012   Procedure: bilateral MASTECTOMY;  Surgeon: Krystal JINNY Russell, MD;  Location: [facility] OR;  Service: General;  Laterality: Bilateral;   VULVECTOMY N/A 07/14/2013   Procedure: WIDE LOCAL  EXCISION VULVAR;  Surgeon: Toribio LITTIE Percy, MD;  Location: [facility];  Service: Gynecology;  Laterality: N/A;   Social History   Tobacco Use   Smoking status: Never   Smokeless tobacco: Never  Vaping Use   Vaping status: Never Used  Substance Use Topics   Alcohol  use: No   Drug use: No      Patient Care Team: Lowella Benton LITTIE, PA as PCP - General (Physician Assistant)   Outpatient Medications Prior to Visit  Medication Sig   acetaminophen  (TYLENOL ) 325 MG tablet Take by mouth.   AMBULATORY NON FORMULARY MEDICATION BLOOD PRESSURE MONITOR - ARM CUFF PER PATIENT PREFERENCE / INSURANCE COVERAGE, DX: ELEVATED BLOOD PRESSURE   celecoxib (CELEBREX) 200 MG capsule Take 200 mg by mouth 2 (two) times daily.   Cholecalciferol (VITAMIN D -3 PO) Take 1 tablet by mouth daily.   estradiol  (ESTRACE ) 0.1 MG/GM vaginal cream Apply a pea-sized amount to vaginal opening using index finger 2-3 nights/week   oxycodone  (OXY-IR) 5 MG capsule Take 5 mg by mouth every 4 (four) hours as needed.   pregabalin (LYRICA) 100 MG capsule Take 100 mg by  mouth in the morning, at noon, in the evening, and at bedtime.   prochlorperazine  (COMPAZINE ) 5 MG tablet Take 1-2 tablets (5-10 mg total) by mouth every 6 (six) hours as needed for nausea or vomiting.   solifenacin (VESICARE) 10 MG tablet Take 1 tablet (10 mg total) by mouth daily.   Vitamin D , Ergocalciferol , (DRISDOL ) 1.25 MG (50000 UNIT) CAPS capsule Take 1 capsule (50,000 Units total) by mouth every 7 (seven) days.   [DISCONTINUED] levothyroxine  (SYNTHROID ) 112 MCG tablet TAKE 1 TABLET BY MOUTH DAILY BEFORE BREAKFAST.   [DISCONTINUED] trimethoprim  (TRIMPEX ) 100 MG tablet Take 1 tablet (100 mg total) by mouth daily. (UTI PREVENTION)   [DISCONTINUED] venlafaxine  XR (EFFEXOR -XR) 150 MG 24 hr capsule Take 1 capsule (150 mg total) by mouth daily  with breakfast.   No facility-administered medications prior to visit.    ROS Complete 12 point ROS performed with all pertinent positives listed in HPI     Objective:     BP 137/88   Pulse 90   Ht 5' 4 (1.626 m)   Wt 168 lb (76.2 kg)   LMP 09/11/2012   SpO2 95%   BMI 28.84 kg/m  BP Readings from Last 3 Encounters:  05/14/24 137/88  05/13/24 (!) 145/90  04/22/24 134/82   Wt Readings from Last 3 Encounters:  05/14/24 168 lb (76.2 kg)  05/13/24 167 lb (75.8 kg)  04/22/24 167 lb (75.8 kg)      Physical Exam Vitals and nursing note reviewed. Exam conducted with a chaperone present.  Constitutional:      General: She is not in acute distress.    Appearance: Normal appearance. She is not ill-appearing, toxic-appearing or diaphoretic.  HENT:     Head: Normocephalic and atraumatic.     Right Ear: Tympanic membrane, ear canal and external ear normal. There is no impacted cerumen.     Left Ear: Tympanic membrane, ear canal and external ear normal. There is no impacted cerumen.     Nose: Nose normal. No congestion or rhinorrhea.     Mouth/Throat:     Mouth: Mucous membranes are moist.     Pharynx: Oropharynx is clear. No oropharyngeal  exudate or posterior oropharyngeal erythema.  Eyes:     General: No scleral icterus.       Right eye: No discharge.        Left eye: No discharge.     Extraocular Movements: Extraocular movements intact.     Pupils: Pupils are equal, round, and reactive to light.  Cardiovascular:     Rate and Rhythm: Normal rate and regular rhythm.     Heart sounds: Normal heart sounds. No murmur heard. Pulmonary:     Effort: Pulmonary effort is normal. No respiratory distress.     Breath sounds: Normal breath sounds. No stridor. No rhonchi.  Abdominal:     General: Abdomen is flat. There is no distension.     Palpations: Abdomen is soft.     Tenderness: There is no abdominal tenderness. There is no right CVA tenderness, left CVA tenderness, guarding or rebound.  Musculoskeletal:     Cervical back: Normal range of motion and neck supple. No rigidity or tenderness.     Right lower leg: No edema.     Left lower leg: No edema.  Lymphadenopathy:     Cervical: No cervical adenopathy.  Skin:    General: Skin is warm and dry.     Coloration: Skin is not jaundiced.     Findings: No bruising, erythema or rash.  Neurological:     Mental Status: She is alert and oriented to person, place, and time.     Gait: Gait abnormal (pt using rollator).  Psychiatric:        Mood and Affect: Mood normal.        Behavior: Behavior normal.      No results found for any visits on 05/14/24. Last CBC Lab Results  Component Value Date   WBC 5.9 04/23/2024   HGB 14.1 04/23/2024   HCT 44.6 04/23/2024   MCV 90 04/23/2024   MCH 28.5 04/23/2024   RDW 14.6 04/23/2024   PLT 275 04/23/2024   Last metabolic panel Lab Results  Component Value Date   GLUCOSE 96 04/23/2024   NA 140 04/23/2024  K 4.7 04/23/2024   CL 102 04/23/2024   CO2 23 04/23/2024   BUN 12 04/23/2024   CREATININE 0.77 04/23/2024   EGFR 83 04/23/2024   CALCIUM  9.2 04/23/2024   PHOS 3.2 06/15/2015   PROT 6.8 04/23/2024   ALBUMIN  4.4 04/23/2024    LABGLOB 2.4 04/23/2024   BILITOT 0.4 04/23/2024   ALKPHOS 142 (H) 04/23/2024   AST 32 04/23/2024   ALT 38 (H) 04/23/2024   ANIONGAP 7 10/04/2015   Last lipids Lab Results  Component Value Date   CHOL 220 (H) 04/23/2024   HDL 43 04/23/2024   LDLCALC 144 (H) 04/23/2024   LDLDIRECT 149.1 07/18/2011   TRIG 182 (H) 04/23/2024   CHOLHDL 5.1 (H) 04/23/2024   Last hemoglobin A1c Lab Results  Component Value Date   HGBA1C 5.8 (H) 04/23/2024   Last thyroid  functions Lab Results  Component Value Date   TSH 6.370 (H) 04/23/2024   T3TOTAL 139.7 04/06/2014   T4TOTAL 4.5 (L) 01/30/2007   FREET4 1.25 04/23/2024   Last vitamin D  Lab Results  Component Value Date   VD25OH 16.7 (L) 04/23/2024   Last vitamin B12 and Folate Lab Results  Component Value Date   VITAMINB12 238 03/23/2020   FOLATE 30.4 06/23/2015        Assessment & Plan:    Routine Health Maintenance and Physical Exam  Immunization History  Administered Date(s) Administered   Fluad Quad(high Dose 65+) 04/28/2020   Influenza,inj,Quad PF,6+ Mos 04/12/2017, 05/14/2018   Influenza-Unspecified 05/30/2014, 06/01/2015, 04/15/2016   Janssen (J&J) SARS-COV-2 Vaccination 10/24/2019   Moderna Sars-Covid-2 Vaccination 05/13/2020   Pneumococcal Conjugate-13 04/28/2020   Tdap 09/06/2014   Zoster Recombinant(Shingrix ) 05/10/2017, 11/08/2017    Health Maintenance  Topic Date Due   Medicare Annual Wellness (AWV)  Never done   COVID-19 Vaccine (3 - 2025-26 season) 05/30/2024 (Originally 03/16/2024)   Influenza Vaccine  10/13/2024 (Originally 02/14/2024)   Pneumococcal Vaccine: 50+ Years (2 of 2 - PCV20 or PCV21) 05/14/2025 (Originally 04/28/2021)   DTaP/Tdap/Td (2 - Td or Tdap) 09/06/2024   Colonoscopy  06/29/2030   DEXA SCAN  Completed   Hepatitis C Screening  Completed   Zoster Vaccines- Shingrix   Completed   Meningococcal B Vaccine  Aged Out    Discussed health benefits of physical activity, and encouraged her to  engage in regular exercise appropriate for her age and condition.  Problem List Items Addressed This Visit     Hypothyroidism   Relevant Medications   levothyroxine  (SYNTHROID ) 112 MCG tablet   Other Relevant Orders   TSH + free T4   Depression   Relevant Medications   venlafaxine  XR (EFFEXOR -XR) 150 MG 24 hr capsule   Vitamin D  deficiency   Relevant Orders   Vitamin D  (25 hydroxy)   Recurrent UTI   Relevant Medications   trimethoprim  (TRIMPEX ) 100 MG tablet   Sciatic leg pain   Relevant Medications   venlafaxine  XR (EFFEXOR -XR) 150 MG 24 hr capsule   Spinal cord stimulator status   Other Visit Diagnoses       Routine health maintenance    -  Primary      Return in about 6 months (around 11/12/2024).  Assessment and Plan    Chronic left leg pain due to sciatic nerve injury and chronic pain syndrome Chronic left leg pain due to sciatic nerve injury, worsened by cold. Previous spinal injections ineffective. Current regimen includes Lyrica, Celebrex, oxycodone . Discussed extended-release medication for sleep improvement. - Proceed with scheduled nerve block  on November 10th. - Discuss extended-release pain medication with pain management.  Urinary incontinence with neurogenic bladder Urinary incontinence managed with solifenacin, reducing catheterization frequency. - Continue solifenacin as prescribed.  Recurrent urinary tract infection, prophylaxis Prophylactic trimethoprim  effective since August. - Continue trimethoprim  daily. - Refill trimethoprim  prescription.  Hypothyroidism Long-standing hypothyroidism on levothyroxine  112 mcg. TSH minimally elevated, free hormones normal, no symptoms reported. - Continue levothyroxine  112 mcg daily. - Recheck thyroid  function tests in 3 months.  Depression Depression managed with venlafaxine . - Continue venlafaxine  as prescribed.  Constipation, medication-induced Constipation likely due to opioids, managed with Colace. -  Continue over-the-counter stool softener as needed.  Vitamin D  deficiency Vitamin D  deficiency treated with 50,000 IU weekly. New prescription issued October 10th. - Continue vitamin D  50,000 IU weekly. - Recheck vitamin D  levels in 3 months.  Adult Wellness Visit Annual exam completed. Vegetarian diet, limited activity due to leg pain. Medications reviewed, adherence confirmed. - Continue current medications. - Discussed importance of regular follow-up visits.  General Health Maintenance Tetanus up to date, colonoscopy 2021, eye exam normal, dental visit pending. - Consider updating pneumonia and flu vaccines later; deferred today.          Benton LITTIE Gave, PA

## 2024-05-14 NOTE — Patient Instructions (Signed)
 Continue all your medications as ordered.  Please return in 3 months for repeat labs.  Please talk with your pain management about getting on an extended release pain medication for night.  Please return for a nurse visit to complete your influenza and pneumonia vaccine.

## 2024-05-25 ENCOUNTER — Ambulatory Visit
Attending: Student in an Organized Health Care Education/Training Program | Admitting: Student in an Organized Health Care Education/Training Program

## 2024-05-25 ENCOUNTER — Encounter: Payer: Self-pay | Admitting: Student in an Organized Health Care Education/Training Program

## 2024-05-25 VITALS — BP 127/95 | HR 101 | Temp 97.1°F | Resp 17 | Ht 64.0 in | Wt 169.0 lb

## 2024-05-25 DIAGNOSIS — M79605 Pain in left leg: Secondary | ICD-10-CM | POA: Insufficient documentation

## 2024-05-25 DIAGNOSIS — M5430 Sciatica, unspecified side: Secondary | ICD-10-CM | POA: Diagnosis not present

## 2024-05-25 DIAGNOSIS — G894 Chronic pain syndrome: Secondary | ICD-10-CM | POA: Diagnosis present

## 2024-05-25 MED ORDER — ROPIVACAINE HCL 2 MG/ML IJ SOLN
INTRAMUSCULAR | Status: AC
  Filled 2024-05-25: qty 20

## 2024-05-25 MED ORDER — DIAZEPAM 5 MG PO TABS
ORAL_TABLET | ORAL | Status: AC
  Filled 2024-05-25: qty 1

## 2024-05-25 MED ORDER — ROPIVACAINE HCL 2 MG/ML IJ SOLN
9.0000 mL | Freq: Once | INTRAMUSCULAR | Status: AC
  Administered 2024-05-25: 9 mL via PERINEURAL

## 2024-05-25 MED ORDER — LIDOCAINE HCL (PF) 2 % IJ SOLN
INTRAMUSCULAR | Status: AC
  Filled 2024-05-25: qty 10

## 2024-05-25 MED ORDER — DEXAMETHASONE SOD PHOSPHATE PF 10 MG/ML IJ SOLN
10.0000 mg | Freq: Once | INTRAMUSCULAR | Status: AC
  Administered 2024-05-25: 10 mg

## 2024-05-25 MED ORDER — LIDOCAINE HCL 2 % IJ SOLN
20.0000 mL | Freq: Once | INTRAMUSCULAR | Status: AC
  Administered 2024-05-25: 100 mg

## 2024-05-25 MED ORDER — DIAZEPAM 5 MG PO TABS
5.0000 mg | ORAL_TABLET | ORAL | Status: AC
  Administered 2024-05-25: 5 mg via ORAL

## 2024-05-25 NOTE — Progress Notes (Signed)
 PROVIDER NOTE: Interpretation of information contained herein should be left to medically-trained personnel. Specific patient instructions are provided elsewhere under Patient Instructions section of medical record. This document was created in part using STT-dictation technology, any transcriptional errors that may result from this process are unintentional.  Patient: Nancy Beard Type: Established DOB: February 25, 1956 MRN: [id number] PCP: Lowella Benton CROME, PA  Service: Procedure DOS: 05/25/2024 Setting: Ambulatory Location: Ambulatory outpatient facility Delivery: Face-to-face Provider: Wallie Sherry, MD Specialty: Interventional Pain Management Specialty designation: 09 Location: Outpatient facility Ref. Prov.: Crain, Whitney L, PA       Interventional Therapy   Primary Reason for Visit: Interventional Pain Management Treatment. CC: Ankle Pain (Left)    Procedure:          Anesthesia, Analgesia, Anxiolysis:  Type: Left sciatic nerve block superior to popliteal crease Primary Purpose: Diagnostic Approach: Posterior approach Laterality: Left  Anesthesia: Local (1-2% Lidocaine )  Anxiolysis: Oral Valium  5 mg PO  Sedation: None  Guidance: Ultrasound           Position: Prone    1. Left leg pain   2. Sciatic leg pain   3. Chronic pain syndrome    NAS-11 Pain score:   Pre-procedure: 9 /10   Post-procedure: 3 /10     H&P (Pre-op Assessment):  Nancy Beard is a 68 y.o. (year old), female patient, seen today for interventional treatment. She  has a past surgical history that includes Esophagogastroduodenoscopy (2007); cryoblation of cervix; Breast lumpectomy; Total mastectomy (Bilateral, 09/11/2012); Axillary sentinel node biopsy (Right, 09/11/2012); Breast reconstruction with placement of tissue expander and flex hd (acellular hydrated dermis) (Bilateral, 09/11/2012); Incision and drainage of wound (Left, 09/16/2012); Portacath placement (Right, 10/09/2012); Removal of bilateral tissue  expanders with placement of bilateral breast implants (Bilateral, 06/24/2013); Vulvectomy (N/A, 07/14/2013); Port-a-cath removal (Right, 08/07/2013); Breast reconstruction (Right, 09/24/2013); Liposuction with lipofilling (Bilateral, 09/24/2013); External fixation leg (Left, 06/15/2015); ORIF tibia plateau (Left, 06/21/2015); Liposuction with lipofilling (Bilateral, 06/08/2020); and Colonoscopy (12/20/2004). Nancy Beard has a current medication list which includes the following prescription(s): acetaminophen , AMBULATORY NON FORMULARY MEDICATION, celecoxib, cholecalciferol, ciprofloxacin , estradiol , levothyroxine , oxycodone , pregabalin, prochlorperazine , solifenacin, trimethoprim , venlafaxine  xr, and vitamin d  (ergocalciferol ). Her primarily concern today is the Ankle Pain (Left)  Initial Vital Signs:  Pulse/HCG Rate: (!) 101ECG Heart Rate: 94 Temp: (!) 97.1 F (36.2 C) Resp: 18 BP: (!) 143/89 SpO2: 97 %  BMI: Estimated body mass index is 29.01 kg/m as calculated from the following:   Height as of this encounter: 5' 4 (1.626 m).   Weight as of this encounter: 169 lb (76.7 kg).  Risk Assessment: Allergies: Reviewed. She is allergic to ciprofloxacin , hydromorphone  hcl, nitrofurantoin , tizanidine, gluten meal, tramadol , and melatonin.  Allergy Precautions: None required Coagulopathies: Reviewed. None identified.  Blood-thinner therapy: None at this time Active Infection(s): Reviewed. None identified. Nancy Beard is afebrile  Site Confirmation: Nancy Beard was asked to confirm the procedure and laterality before marking the site Procedure checklist: Completed Consent: Before the procedure and under the influence of no sedative(s), amnesic(s), or anxiolytics, the patient was informed of the treatment options, risks and possible complications. To fulfill our ethical and legal obligations, as recommended by the American Medical Association's Code of Ethics, I have informed the patient of my clinical  impression; the nature and purpose of the treatment or procedure; the risks, benefits, and possible complications of the intervention; the alternatives, including doing nothing; the risk(s) and benefit(s) of the alternative treatment(s) or procedure(s); and the risk(s) and benefit(s) of doing  nothing. The patient was provided information about the general risks and possible complications associated with the procedure. These may include, but are not limited to: failure to achieve desired goals, infection, bleeding, organ or nerve damage, allergic reactions, paralysis, and death. In addition, the patient was informed of those risks and complications associated to the procedure, such as failure to decrease pain; infection; bleeding; organ or nerve damage with subsequent damage to sensory, motor, and/or autonomic systems, resulting in permanent pain, numbness, and/or weakness of one or several areas of the body; allergic reactions; (i.e.: anaphylactic reaction); and/or death. Furthermore, the patient was informed of those risks and complications associated with the medications. These include, but are not limited to: allergic reactions (i.e.: anaphylactic or anaphylactoid reaction(s)); adrenal axis suppression; blood sugar elevation that in diabetics may result in ketoacidosis or comma; water retention that in patients with history of congestive heart failure may result in shortness of breath, pulmonary edema, and decompensation with resultant heart failure; weight gain; swelling or edema; medication-induced neural toxicity; particulate matter embolism and blood vessel occlusion with resultant organ, and/or nervous system infarction; and/or aseptic necrosis of one or more joints. Finally, the patient was informed that Medicine is not an exact science; therefore, there is also the possibility of unforeseen or unpredictable risks and/or possible complications that may result in a catastrophic outcome. The patient  indicated having understood very clearly. We have given the patient no guarantees and we have made no promises. Enough time was given to the patient to ask questions, all of which were answered to the patient's satisfaction. Ms. Anglin has indicated that she wanted to continue with the procedure. Attestation: I, the ordering provider, attest that I have discussed with the patient the benefits, risks, side-effects, alternatives, likelihood of achieving goals, and potential problems during recovery for the procedure that I have provided informed consent. Date  Time: 05/25/2024 12:48 PM  Pre-Procedure Preparation:  Monitoring: As per clinic protocol. Respiration, ETCO2, SpO2, BP, heart rate and rhythm monitor placed and checked for adequate function Safety Precautions: Patient was assessed for positional comfort and pressure points before starting the procedure. Time-out: I initiated and conducted the Time-out before starting the procedure, as per protocol. The patient was asked to participate by confirming the accuracy of the Time Out information. Verification of the correct person, site, and procedure were performed and confirmed by me, the nursing staff, and the patient. Time-out conducted as per Joint Commission's Universal Protocol (UP.01.01.01). Time: 1357 Start Time: 1357 hrs.  Description of Procedure:          Area Prepped: Entire posterior thigh region, superior to popliteal crease  ChloraPrep (2% chlorhexidine  gluconate and 70% isopropyl alcohol ) Safety Precautions: Aspiration looking for blood return was conducted prior to all injections. At no point did we inject any substances, as a needle was being advanced. No attempts were made at seeking any paresthesias. Safe injection practices and needle disposal techniques used. Medications properly checked for expiration dates. SDV (single dose vial) medications used.  Preparation & Equipment:  The skin over the popliteal fossa was  sterilized with chlorhexidine  and draped in a sterile manner. A high-frequency linear ultrasound transducer (or low-frequency curvilinear probe for deep structures) was used. A 22-gauge, 3.5- to 5-inch block needle was used for the injection.  Ultrasound Localization:  The sciatic nerve was identified in the popliteal fossa, approximately 5-7 cm proximal to the popliteal crease, before its bifurcation into the tibial and common peroneal nerves. The nerve was visualized as a hyperechoic,  oval-shaped structure between the biceps femoris (lateral) and semimembranosus/semitendinosus (medial) muscles, adjacent to the popliteal artery and vein.  Needle Placement & Injection:  Under real-time ultrasound guidance, the needle was inserted using an in-plane approach, advancing toward the sciatic nerve without intraneural contact.  After negative aspiration for blood, 10mL of nerve block solution below (9cc of 0.2% Ropivacaine+ 1 cc of Decadron  10 mg/cc) was injected, ensuring circumferential spread around the nerve. Hydrodissection was performed to confirm adequate nerve sheath expansion.  Post-Procedure Assessment:  The patient tolerated the procedure well, with no signs of vascular puncture, hematoma, or local anesthetic systemic toxicity (LAST). The patient reported sensory and motor changes in the distal leg and foot, confirming an effective block.    Vitals:   05/25/24 1253 05/25/24 1358 05/25/24 1404 05/25/24 1414  BP: (!) 143/89 121/87 (!) 134/95 (!) 127/95  Pulse: (!) 101     Resp: 18 18 15 17   Temp: (!) 97.1 F (36.2 C)     TempSrc: Temporal     SpO2: 97% 95% 96% 98%  Weight: 169 lb (76.7 kg)     Height: 5' 4 (1.626 m)       Start Time: 1357 hrs. End Time: 1407 hrs.      Materials:  Needle(s) Type: Pajunk pedal Gauge: 22G Length: 3.5-in Medication(s): Please see orders for medications and dosing details.  Imaging Guidance (Non-Spinal):          Type of Imaging  Technique: Ultrasound guidance  Antibiotic Prophylaxis:   Anti-infectives (From admission, onward)    None      Indication(s): None identified  Post-operative Assessment:  Post-procedure Vital Signs:  Pulse/HCG Rate: (!) 10190 Temp: (!) 97.1 F (36.2 C) Resp: 17 BP: (!) 127/95 SpO2: 98 %  EBL: None  Complications: No immediate post-treatment complications observed by team, or reported by patient.  Note: The patient tolerated the entire procedure well. A repeat set of vitals were taken after the procedure and the patient was kept under observation following institutional policy, for this type of procedure. Post-procedural neurological assessment was performed, showing return to baseline, prior to discharge. The patient was provided with post-procedure discharge instructions, including a section on how to identify potential problems. Should any problems arise concerning this procedure, the patient was given instructions to immediately contact us , at any time, without hesitation. In any case, we plan to contact the patient by telephone for a follow-up status report regarding this interventional procedure.  Comments:  No additional relevant information.  Plan of Care (POC)  Orders:  No orders of the defined types were placed in this encounter.   Medications ordered for procedure: Meds ordered this encounter  Medications   lidocaine  (XYLOCAINE ) 2 % (with pres) injection 400 mg   ropivacaine (PF) 2 mg/mL (0.2%) (NAROPIN) injection 9 mL   dexamethasone  (DECADRON ) injection 10 mg   diazepam  (VALIUM ) tablet 5 mg    Make sure Flumazenil is available in the pyxis when using this medication. If oversedation occurs, administer 0.2 mg IV over 15 sec. If after 45 sec no response, administer 0.2 mg again over 1 min; may repeat at 1 min intervals; not to exceed 4 doses (1 mg)   Medications administered: We administered lidocaine , ropivacaine (PF) 2 mg/mL (0.2%), dexamethasone , and  diazepam .  See the medical record for exact dosing, route, and time of administration.    Left popliteal sciatic nerve block 05/25/2024: Consider Sprint peripheral nerve stimulation    Follow-up plan:   Return in about 3  weeks (around 06/15/2024) for PPE, F2F, Seema.     Recent Visits Date Type Provider Dept  05/13/24 Office Visit Marcelino Nurse, MD [facility]  Showing recent visits within past 90 days and meeting all other requirements Today's Visits Date Type Provider Dept  05/25/24 Procedure visit Marcelino Nurse, MD [facility]  Showing today's visits and meeting all other requirements Future Appointments Date Type Provider Dept  06/16/24 Appointment Patel, Seema K, NP [facility]  Showing future appointments within next 90 days and meeting all other requirements   Disposition: Discharge home  Discharge (Date  Time): 05/25/2024; 1425 hrs.   Primary Care Physician: Lowella Benton CROME, [place] Location: [facility] Note by: Nurse Marcelino, MD (TTS technology used. I apologize for any typographical errors that were not detected and corrected.) Date: 05/25/2024; Time: 3:18 PM  Disclaimer:  Medicine is not an visual merchandiser. The only guarantee in medicine is that nothing is guaranteed. It is important to note that the decision to proceed with this intervention was based on the information collected from the patient. The Data and conclusions were drawn from the patient's questionnaire, the interview, and the physical examination. Because the information was provided in large part by the patient, it cannot be guaranteed that it has not been purposely or unconsciously manipulated. Every effort has been made to obtain as much relevant data as possible for this evaluation. It is important to note that the conclusions that lead to this procedure are derived in large part from the available data. Always take into account that the treatment will  also be dependent on availability of resources and existing treatment guidelines, considered by other Pain Management Practitioners as being common knowledge and practice, at the time of the intervention. For Medico-Legal purposes, it is also important to point out that variation in procedural techniques and pharmacological choices are the acceptable norm. The indications, contraindications, technique, and results of the above procedure should only be interpreted and judged by a Board-Certified Interventional Pain Specialist with extensive familiarity and expertise in the same exact procedure and technique.

## 2024-05-25 NOTE — Progress Notes (Signed)
 Safety precautions to be maintained throughout the outpatient stay will include: orient to surroundings, keep bed in low position, maintain call bell within reach at all times, provide assistance with transfer out of bed and ambulation.

## 2024-05-26 ENCOUNTER — Telehealth: Payer: Self-pay

## 2024-05-26 NOTE — Telephone Encounter (Signed)
 Post procedure follow up.  Husband states that she got good relief for about 6 hours, then the pain returned.  Informed patient that colton is normal.  Will notify Dr Marcelino as well.

## 2024-06-14 DIAGNOSIS — N9089 Other specified noninflammatory disorders of vulva and perineum: Secondary | ICD-10-CM | POA: Insufficient documentation

## 2024-06-14 NOTE — Progress Notes (Unsigned)
 PROVIDER NOTE: Interpretation of information contained herein should be left to medically-trained personnel. Specific patient instructions are provided elsewhere under Patient Instructions section of medical record. This document was created in part using AI and STT-dictation technology, any transcriptional errors that may result from this process are unintentional.  Patient: Nancy Beard  Service: E/M   PCP: Lowella Benton CROME, PA  DOB: [birth year]  DOS: 06/16/2024  Provider: Emmy MARLA Blanch, NP  MRN: [id number]  Delivery: Face-to-face  Specialty: Interventional Pain Management  Type: Established Patient  Setting: Ambulatory outpatient facility  Specialty designation: 09  Referring Prov.: Lowella Benton CROME, PA  Location: Outpatient office facility       History of present illness (HPI) Nancy Beard, a 68 y.o. year old female, is here today because of her Left leg pain [M79.605]. Nancy Beard primary complain today is 69No chief complaint on file.  Pertinent problems: Nancy Beard has Primary cancer of lower-inner quadrant of right female breast (HCC); Osteoporosis; Vulvar intraepithelial neoplasia III (VIN III); Anticoagulant long-term use; Neuropathy; Vitamin D  deficiency; Closed avulsion fracture of distal fibula, left, initial encounter; L1 burst fracture with instrumentation and spinal cord compression; Left leg pain; Recurrent major depressive disorder, in partial remission; Sciatic leg pain; Chronic pain syndrome; Spinal cord stimulator status; At high risk for falls; Closed burst fracture of lumbar vertebra (HCC); Closed fracture of right superior pubic ramus (HCC); History of DVT (deep vein thrombosis); Incomplete paraplegia (HCC); Lumbar radiculopathy; and S/P spinal fusion on their pertinent problem list.  Pain Assessment: Severity of   is reported as a  /10. Location:    / . Onset:  . Quality:  . Timing:  . Modifying factor(s):  SABRA Vitals:  vitals were not taken for this visit.  BMI:  Estimated body mass index is 29.01 kg/m as calculated from the following:   Height as of 05/25/24: 5' 4 (1.626 m).   Weight as of 05/25/24: 169 lb (76.7 kg).  Last encounter: Visit date not found. Last procedure: 05/25/2024  Reason for encounter: post-procedure evaluation and assessment.   Discussed the use of AI scribe software for clinical note transcription with the patient, who gave verbal consent to proceed.  History of Present Illness          Procedure:           Anesthesia, Analgesia, Anxiolysis:  Type: Left sciatic nerve block superior to popliteal crease Primary Purpose: Diagnostic Approach: Posterior approach Laterality: Left   Anesthesia: Local (1-2% Lidocaine )  Anxiolysis: Oral Valium  5 mg PO  Sedation: None  Guidance: Ultrasound             Position: Prone      1. Left leg pain   2. Sciatic leg pain   3. Chronic pain syndrome     NAS-11 Pain score:        Pre-procedure: 9 /10        Post-procedure: 3 /10   Post-Procedure Evaluation    Effectiveness:  Initial hour after procedure:   ***. Subsequent 4-6 hours post-procedure:   ***. Analgesia past initial 6 hours:   ***. Ongoing improvement:  Analgesic:  *** Function: {Blank single:19197::No benefit,No improvement,Back to baseline,Transient improvement,Nancy Beard reports improvement in function,Somewhat improved,Minimal improvement,   ***   } ROM: {Blank single:19197::No benefit,No improvement,Back to baseline,Transient improvement,Nancy Beard reports improvement in ROM,Somewhat improved,Minimal improvement,   ***   } Interpretation: ***  Pharmacotherapy Assessment   {There is no content from the last Subjective section.}  Monitoring: [place] PMP: PDMP not reviewed this encounter.       Pharmacotherapy: No side-effects or adverse reactions reported. Compliance: No problems identified. Effectiveness: Clinically acceptable.  No notes on file  UDS:  No results found for:  SUMMARY  No results found for: CBDTHCR No results found for: D8THCCBX No results found for: D9THCCBX  ROS  Constitutional: Denies any fever or chills Gastrointestinal: No reported hemesis, hematochezia, vomiting, or acute GI distress Musculoskeletal: Denies any acute onset joint swelling, redness, loss of ROM, or weakness Neurological: No reported episodes of acute onset apraxia, aphasia, dysarthria, agnosia, amnesia, paralysis, loss of coordination, or loss of consciousness  Medication Review  AMBULATORY NON FORMULARY MEDICATION, Cholecalciferol, Vitamin D  (Ergocalciferol ), acetaminophen , celecoxib, ciprofloxacin , estradiol , levothyroxine , oxycodone , pregabalin, prochlorperazine , solifenacin , trimethoprim , and venlafaxine  XR  History Review  Allergy: Nancy Beard is allergic to ciprofloxacin , hydromorphone  hcl, nitrofurantoin , tizanidine, gluten meal, tramadol , and melatonin. Drug: Nancy Beard  reports no history of drug use. Alcohol :  reports no history of alcohol  use. Tobacco:  reports that she has never smoked. She has never used smokeless tobacco. Social: Nancy Beard  reports that she has never smoked. She has never used smokeless tobacco. She reports that she does not drink alcohol  and does not use drugs. Medical:  has a past medical history of Anxiety, Blood transfusion without reported diagnosis (2014), Breast cancer (HCC) (2014), Celiac disease, Depression, DIVERTICULOSIS, COLON (05/05/2007), DVT (deep venous thrombosis) (HCC) (2014), DVT (deep venous thrombosis) (HCC) (06/30/2013), Fracture of left tibial plateau (06/15/2015), Fracture tibia/fibula (06/14/2015), HEMORRHOIDS, EXTERNAL (05/05/2007), History of chemotherapy, Hypothyroidism, Incontinence, Neuropathy, Pneumonia, Thyroid  disease, Vitamin D  insufficiency (06/20/2015), and Wears glasses. Surgical: Nancy Beard  has a past surgical history that includes Esophagogastroduodenoscopy (2007); cryoblation of cervix; Breast  lumpectomy; Total mastectomy (Bilateral, 09/11/2012); Axillary sentinel node biopsy (Right, 09/11/2012); Breast reconstruction with placement of tissue expander and flex hd (acellular hydrated dermis) (Bilateral, 09/11/2012); Incision and drainage of wound (Left, 09/16/2012); Portacath placement (Right, 10/09/2012); Removal of bilateral tissue expanders with placement of bilateral breast implants (Bilateral, 06/24/2013); Vulvectomy (N/A, 07/14/2013); Port-a-cath removal (Right, 08/07/2013); Breast reconstruction (Right, 09/24/2013); Liposuction with lipofilling (Bilateral, 09/24/2013); External fixation leg (Left, 06/15/2015); ORIF tibia plateau (Left, 06/21/2015); Liposuction with lipofilling (Bilateral, 06/08/2020); and Colonoscopy (12/20/2004). Family: family history includes Breast cancer in her paternal aunt, paternal grandmother, and another family member; COPD in her father; Heart attack in her paternal grandfather; Lymphoma in her paternal uncle; Osteoporosis in her mother.  Laboratory Chemistry Profile   Renal Lab Results  Component Value Date   BUN 12 04/23/2024   CREATININE 0.77 04/23/2024   BCR 16 04/23/2024   GFR 111.43 07/18/2012   GFRAA 108 03/23/2020   GFRNONAA 93 03/23/2020    Hepatic Lab Results  Component Value Date   AST 32 04/23/2024   ALT 38 (H) 04/23/2024   ALBUMIN  4.4 04/23/2024   ALKPHOS 142 (H) 04/23/2024    Electrolytes Lab Results  Component Value Date   NA 140 04/23/2024   Beard 4.7 04/23/2024   CL 102 04/23/2024   CALCIUM  9.2 04/23/2024   MG 2.2 05/21/2022   PHOS 3.2 06/15/2015    Bone Lab Results  Component Value Date   VD25OH 16.7 (L) 04/23/2024   [id number] 47 04/18/2021   [id number] 47 04/18/2021   [id number] <8 04/18/2021    Inflammation (CRP: Acute Phase) (ESR: Chronic Phase) No results found for: CRP, ESRSEDRATE, LATICACIDVEN       Note: Above Lab results reviewed.  Recent Imaging Review  NCV with EMG(electromyography)  Nancy Nancy CROME, MD      10/22/2023 11:19 AM  [facility]  [address], [address]  [place], [place] [address] Tel: [phone] Fax: [phone] Test Date:  10/22/2023  Patient: Nancy Beard DOB: [birth year] Physician: Nancy Leigh, MD   Sex: Female Height: 5' 6 Ref Phys: Penne Sharps, MD  ID#: [id number]   Technician:    History: This is a 68 year old female with left lower limb pain.  NCV & EMG Findings: Extensive electrodiagnostic evaluation of the left lower limb  shows: Left sural and superficial peroneal/fibular sensory responses are  within normal limits. Left peroneal/fibular (EDB) motor nerve shows evidence of a  normal anatomic variant, an accessory peroneal. When accounting  for this, the left peroneal/fibular (EDB) motor response is  within normal limits. Left tibial (AH) motor response is within  normal limits. Left H reflex is absent. Chronic motor axon loss changes WITH accompanying active  denervation changes are seen in the left medial head of  gastrocnemius and short head of biceps femoris muscles. Chronic  motor axon loss changes WITHOUT accompanying active denervation  changes are seen in the left tibialis anterior, flexor digitorum  longus, and gluteus medius muscles. Lumbosacral paraspinal  muscles were deferred due to prior lumbosacral spine surgery.  Impression: This is an abnormal study. The findings are most consistent with  the following: An active/ongoing intraspinal canal lesion (ie: motor  radiculopathy) at the left S1 root or segment, moderate in degree  electrically. The residuals of an old intraspinal canal lesion (ie: motor  radiculopathy) at the left L5 root or segment, mild in degree  electrically. No electrodiagnostic evidence of a large fiber sensorimotor  neuropathy. No electrodiagnostic evidence of left tibial, peroneal/fibular,  or sciatic mononeuropathy.  ___________________________ Nancy Leigh, MD  Nerve Conduction Studies Motor  Nerve Results    Latency Amplitude F-Lat Segment Distance CV Comment  Site (ms) Norm (mV) Norm (ms)  (cm) (m/s) Norm   Left Fibular (EDB) Motor  Lat mall 5.9 - 1.31 -        Ankle 5.2  < 6.0 *1.33  > 2.5        Bel fib head 12.0 - 1.74 -  Bel fib head-Ankle 29.5 43  > 40         Bel fib head-Lat mall 29.5 48 -   Pop fossa 13.7 - 1.74 -  Pop fossa-Bel fib head 10 59 -   Left Tibial (AH) Motor  Ankle 3.7  < 6.0 10.2  > 4.0        Knee 13.2 - 7.8 -  Knee-Ankle 42 44  > 40    Sensory Sites    Neg Peak Lat Amplitude (O-P) Segment Distance Velocity Comment  Site (ms) Norm (V) Norm  (cm) (ms)   Left Superficial Fibular Sensory  14 cm-Ankle 2.1  < 4.6 6  > 3 14 cm-Ankle 8    Left Sural Sensory  Calf-Lat mall 2.8  < 4.6 4  > 3 Calf-Lat mall 14     H-Reflex Results    M-Lat H Lat H Neg Amp H-M Lat  Site (ms) (ms) Norm (mV) (ms)  Left Tibial H-Reflex  Pop fossa 5.3 -  < 35.0 - -   Electromyography   Side Muscle Ins.Act Fibs Fasc Recrt Amp Dur Poly Activation  Comment  Left Tib ant Nml Nml Nml *1- *1+ *1+ Nml Nml N/A  Left Gastroc MH Nml *2+ Nml *2- *1+ *1+ *1+  Nml N/A  Left FDL Nml Nml Nml *2- *1+ *1+ Nml Nml N/A  Left Rectus fem Nml Nml Nml Nml Nml Nml Nml Nml N/A  Left Biceps fem SH Nml *1+ Nml *1- *1+ *1+ Nml Nml N/A  Left Gluteus med Nml Nml Nml *1- *1+ *1+ Nml Nml N/A   Waveforms:  Motor      Sensory      H-Reflex   Note: Reviewed        Physical Exam  Vitals: LMP 09/11/2012  BMI: Estimated body mass index is 29.01 kg/m as calculated from the following:   Height as of 05/25/24: 5' 4 (1.626 m).   Weight as of 05/25/24: 169 lb (76.7 kg). Ideal: Patient weight not recorded General appearance: Well nourished, well developed, and well hydrated. In no apparent acute distress Mental status: Alert, oriented x 3 (person, place, & time)       Respiratory: No evidence of acute respiratory distress Eyes: PERLA   Assessment   Diagnosis Status  1. Left leg pain    2. Sciatic leg pain   3. Chronic pain syndrome   4. Spinal cord stimulator status ([place]- Dr Daril 2022)    Controlled Controlled Controlled   Updated Problems: Problem  Closed Fracture of Right Superior Pubic Ramus (Hcc)  History of Dvt (Deep Vein Thrombosis)   2014   Lumbar Radiculopathy   Added automatically from request for surgery [id number]   At High Risk for Falls  S/P Spinal Fusion  Incomplete Paraplegia (Hcc)  Closed Burst Fracture of Lumbar Vertebra (Hcc)  Vulvar Intraepithelial Neoplasia III (Vin Iii)   2014-Conization per pt. Paps yearly with ob/gyn   Lesion of Vulva  Contusion of Right Hip  Full Thickness Burn of Left Foot  Conus Medullaris Syndrome (Hcc)  Gastroesophageal Reflux Disease  Other Specified Disorders of Bone Density and Structure, Multiple Sites  Postmenopause  Urinary Retention  Neurogenic Bladder  Fall From Stairs  Fall   Added automatically from request for surgery [id number]     Plan of Care  Problem-specific:  Assessment and Plan            Nancy Beard has a current medication list which includes the following long-term medication(s): levothyroxine , pregabalin, prochlorperazine , and venlafaxine  xr.  Pharmacotherapy (Medications Ordered): No orders of the defined types were placed in this encounter.  Orders:  No orders of the defined types were placed in this encounter.    {There is no content from the last Plan section.}   No follow-ups on file.    Recent Visits Date Type Provider Dept  05/25/24 Procedure visit Marcelino Nurse, MD [facility]  05/13/24 Office Visit Marcelino Nurse, MD [facility]  Showing recent visits within past 90 days and meeting all other requirements Future Appointments Date Type Provider Dept  06/16/24 Appointment Nancy Mccammon K, NP [facility]  Showing future appointments within next 90 days and meeting all other requirements  I discussed the assessment and  treatment plan with the patient. The patient was provided an opportunity to ask questions and all were answered. The patient agreed with the plan and demonstrated an understanding of the instructions.  Patient advised to call back or seek an in-person evaluation if the symptoms or condition worsens.  Duration of encounter: *** minutes.  Total time on encounter, as per AMA guidelines included both the face-to-face and non-face-to-face time personally spent by the physician and/or other qualified health care professional(s) on the day of the  encounter (includes time in activities that require the physician or other qualified health care professional and does not include time in activities normally performed by clinical staff). Physician's time may include the following activities when performed: Preparing to see the patient (e.g., pre-charting review of records, searching for previously ordered imaging, lab work, and nerve conduction tests) Review of prior analgesic pharmacotherapies. Reviewing PMP Interpreting ordered tests (e.g., lab work, imaging, nerve conduction tests) Performing post-procedure evaluations, including interpretation of diagnostic procedures Obtaining and/or reviewing separately obtained history Performing a medically appropriate examination and/or evaluation Counseling and educating the patient/family/caregiver Ordering medications, tests, or procedures Referring and communicating with other health care professionals (when not separately reported) Documenting clinical information in the electronic or other health record Independently interpreting results (not separately reported) and communicating results to the patient/ family/caregiver Care coordination (not separately reported)  Note by: Pastor Sgro Beard Dezarae Mcclaran, NP (TTS and AI technology used. I apologize for any typographical errors that were not detected and corrected.) Date: 06/16/2024; Time: 1:24 PM

## 2024-06-16 ENCOUNTER — Ambulatory Visit: Admitting: Nurse Practitioner

## 2024-06-16 DIAGNOSIS — M79605 Pain in left leg: Secondary | ICD-10-CM

## 2024-06-16 DIAGNOSIS — G894 Chronic pain syndrome: Secondary | ICD-10-CM

## 2024-06-16 DIAGNOSIS — Z91199 Patient's noncompliance with other medical treatment and regimen due to unspecified reason: Secondary | ICD-10-CM

## 2024-06-16 DIAGNOSIS — Z9689 Presence of other specified functional implants: Secondary | ICD-10-CM

## 2024-06-16 DIAGNOSIS — M5430 Sciatica, unspecified side: Secondary | ICD-10-CM

## 2024-06-30 ENCOUNTER — Other Ambulatory Visit: Payer: Self-pay | Admitting: Urgent Care

## 2024-06-30 DIAGNOSIS — E034 Atrophy of thyroid (acquired): Secondary | ICD-10-CM

## 2024-07-01 ENCOUNTER — Ambulatory Visit: Admitting: Family Medicine

## 2024-07-01 ENCOUNTER — Encounter: Payer: Self-pay | Admitting: Family Medicine

## 2024-07-01 ENCOUNTER — Ambulatory Visit

## 2024-07-01 ENCOUNTER — Ambulatory Visit: Admitting: Urology

## 2024-07-01 VITALS — BP 128/85 | HR 100 | Temp 98.3°F | Resp 18 | Ht 66.0 in | Wt 162.0 lb

## 2024-07-01 DIAGNOSIS — R051 Acute cough: Secondary | ICD-10-CM | POA: Diagnosis not present

## 2024-07-01 DIAGNOSIS — R0789 Other chest pain: Secondary | ICD-10-CM

## 2024-07-01 DIAGNOSIS — J101 Influenza due to other identified influenza virus with other respiratory manifestations: Secondary | ICD-10-CM | POA: Insufficient documentation

## 2024-07-01 LAB — POC COVID19/FLU A&B COMBO
Covid Antigen, POC: NEGATIVE
Influenza A Antigen, POC: POSITIVE — AB
Influenza B Antigen, POC: NEGATIVE

## 2024-07-01 NOTE — Progress Notes (Signed)
 Nancy Beard - 68 y.o. female MRN [id number]  Date of birth: September 05, 1955  Subjective Chief Complaint  Patient presents with   Cough   Fever   Sinusitis    HPI Nancy Beard is a 68 y.o. female here today with complaint of chest congestion, cough, fever, chills/sweats, body aches, chest tightness and wheezing.  Symptoms started about 5 days ago.  She has been afebrile since yesterday.  Still feels tightness in her chest when breathing but overall symptoms seem better than they were over the weekend.  Denies breathing difficulty. She is using OTC dayquil to help with symptoms.  She admits she could do better with fluid intake.   She doesn't have much of an appetitie  ROS:  A comprehensive ROS was completed and negative except as noted per HPI  Allergies[1]  Past Medical History:  Diagnosis Date   Anxiety    Blood transfusion without reported diagnosis 2014   Breast cancer (HCC) 2014   right, bil mast   Celiac disease    Depression    DIVERTICULOSIS, COLON 05/05/2007   DVT (deep venous thrombosis) (HCC) 2014   Right lower extremity, thigh   DVT (deep venous thrombosis) (HCC) 06/30/2013   August 2014. Continued Xarelto  due to persistent chronic DVT R popliteal vein. Plan repeat per oncology March 2016    Fracture of left tibial plateau 06/15/2015   Fracture tibia/fibula 06/14/2015   HEMORRHOIDS, EXTERNAL 05/05/2007   History of chemotherapy    Hypothyroidism    Incontinence    Neuropathy    of fingers and toes   Pneumonia    hx of years ago   Thyroid  disease    Vitamin D  insufficiency 06/20/2015   Wears glasses     Past Surgical History:  Procedure Laterality Date   AXILLARY SENTINEL NODE BIOPSY Right 09/11/2012   Procedure: AXILLARY SENTINEL lymph NODE  BIOPSY;  Surgeon: Krystal JINNY Russell, MD;  Location: [facility] OR;  Service: General;  Laterality: Right;  right nuclear medicine injection 12:30    BREAST LUMPECTOMY     benign lump   BREAST RECONSTRUCTION Right  09/24/2013   Procedure: REVISION OF RIGHT BREAST RECONSTRUCTION WITH REPOSITIONING RIGHT IMPLANT, POSSIBLE EXCISION CAPSULAR CONTRACTURE AND LIPOFILLING FOR FAT GRAFTING;  Surgeon: Estefana Reichert, DO;  Location: [facility];  Service: Plastics;  Laterality: Right;   BREAST RECONSTRUCTION WITH PLACEMENT OF TISSUE EXPANDER AND FLEX HD (ACELLULAR HYDRATED DERMIS) Bilateral 09/11/2012   Procedure: BREAST RECONSTRUCTION WITH PLACEMENT OF TISSUE EXPANDER AND FLEX HD (ACELLULAR HYDRATED DERMIS) ADM;  Surgeon: Estefana Reichert, DO;  Location: [facility] OR;  Service: Plastics;  Laterality: Bilateral;   COLONOSCOPY  12/20/2004   [facility]   cryoblation of cervix     long time ago   ESOPHAGOGASTRODUODENOSCOPY  2007   sprue   EXTERNAL FIXATION LEG Left 06/15/2015   Procedure: EXTERNAL FIXATION LEG;  Surgeon: Ozell Bruch, MD;  Location: [facility] OR;  Service: Orthopedics;  Laterality: Left;   INCISION AND DRAINAGE OF WOUND Left 09/16/2012   Procedure: Left Breast Evacuation of Hematoma;  Surgeon: Estefana Reichert, DO;  Location: [facility] OR;  Service: Plastics;  Laterality: Left;   LIPOSUCTION WITH LIPOFILLING Bilateral 09/24/2013   Procedure: LIPOSUCTION WITH LIPOFILLING;  Surgeon: Estefana Reichert, DO;  Location: [facility];  Service: Plastics;  Laterality: Bilateral;  Biltalteal filling breast   LIPOSUCTION WITH LIPOFILLING Bilateral 06/08/2020   Procedure: LIPOSUCTION WITH LIPOFILLING;  Surgeon: Lowery Estefana RAMAN, DO;  Location: [facility];  Service:  Plastics;  Laterality: Bilateral;  90 min, please   ORIF TIBIA PLATEAU Left 06/21/2015   Procedure: OPEN REDUCTION INTERNAL FIXATION (ORIF) TIBIAL PLATEAU REMOVAL OF EXTERNAL FISTULA;  Surgeon: Ozell Bruch, MD;  Location: [facility] OR;  Service: Orthopedics;  Laterality: Left;   PORT-A-CATH REMOVAL Right 08/07/2013   Procedure: REMOVAL PORT-A-CATH;  Surgeon: Krystal JINNY Russell, MD;  Location: [facility];  Service: General;  Laterality:  Right;   PORTACATH PLACEMENT Right 10/09/2012   Procedure: US  GUIDED INSERTION PORT-A-CATH;  Surgeon: Krystal JINNY Russell, MD;  Location: [facility];  Service: General;  Laterality: Right;  Right Subclavian Vein   REMOVAL OF BILATERAL TISSUE EXPANDERS WITH PLACEMENT OF BILATERAL BREAST IMPLANTS Bilateral 06/24/2013   Procedure: REMOVAL OF BILATERAL TISSUE EXPANDERS WITH PLACEMENT OF BILATERAL BREAST IMPLANTS;  Surgeon: Estefana Reichert, DO;  Location: [facility];  Service: Plastics;  Laterality: Bilateral;   TOTAL MASTECTOMY Bilateral 09/11/2012   Procedure: bilateral MASTECTOMY;  Surgeon: Krystal JINNY Russell, MD;  Location: [facility] OR;  Service: General;  Laterality: Bilateral;   VULVECTOMY N/A 07/14/2013   Procedure: WIDE LOCAL  EXCISION VULVAR;  Surgeon: Toribio LITTIE Percy, MD;  Location: [facility];  Service: Gynecology;  Laterality: N/A;    Social History   Socioeconomic History   Marital status: Married    Spouse name: Not on file   Number of children: Not on file   Years of education: Not on file   Highest education level: Not on file  Occupational History   Occupation: Conservator, Museum/gallery: [employer]  Tobacco Use   Smoking status: Never   Smokeless tobacco: Never  Vaping Use   Vaping status: Never Used  Substance and Sexual Activity   Alcohol  use: No   Drug use: No   Sexual activity: Yes    Partners: Male    Birth control/protection: Post-menopausal  Other Topics Concern   Not on file  Social History Narrative   Married. 1 son- 1 grandson '15 lives in [place].       Works for [employer] full time.       Hobbies: reading/knitting/crochet/outside in fresh air   Social Drivers of Health   Tobacco Use: Low Risk (07/01/2024)   Patient History    Smoking Tobacco Use: Never    Smokeless Tobacco Use: Never    Passive Exposure: Not on file  Financial Resource Strain: Low Risk  (04/05/2023)   Received from [facility]    Overall Financial Resource Strain (CARDIA)    Difficulty of Paying Living Expenses: Not hard at all  Food Insecurity: No Food Insecurity (04/05/2023)   Received from [facility]   Epic    Within the past 12 months, you worried that your food would run out before you got the money to buy more.: Never true    Within the past 12 months, the food you bought just didn't last and you didn't have money to get more.: Never true  Transportation Needs: No Transportation Needs (04/05/2023)   Received from [facility] - Transportation    In the past 12 months, has lack of transportation kept you from medical appointments or from getting medications?: No    Lack of Transportation (Non-Medical): No  Physical Activity: Not on file  Stress: Not on file  Social Connections: Not on file  Depression (PHQ2-9): Low Risk (05/25/2024)   Depression (PHQ2-9)    PHQ-2 Score: 0  Alcohol  Screen: Not on file  Housing: Low Risk  (07/19/2023)   Received from [facility]   Epic    At any time in the past 12 months, were you homeless or living in a shelter (including now)?: No    In the past 12 months, how many times have you moved where you were living?: 1    In the last 12 months, was there a time when you were not able to pay the mortgage or rent on time?: No  Utilities: Not At Risk (02/25/2023)   Received from [facility] Utilities    Threatened with loss of utilities: No  Health Literacy: Not on file    Family History  Problem Relation Age of Onset   Breast cancer Paternal Aunt        dx in her 38s   Lymphoma Paternal Uncle    Breast cancer Paternal Grandmother        dx >50   Heart attack Paternal Grandfather    Breast cancer Other        2 maternal great aunts with breast cancer >50   Osteoporosis Mother        controlled with diet/exercise   COPD Father    Colon cancer Neg Hx    Colon polyps Neg Hx     Esophageal cancer Neg Hx    Rectal cancer Neg Hx    Stomach cancer Neg Hx     Health Maintenance  Topic Date Due   Medicare Annual Wellness (AWV)  Never done   COVID-19 Vaccine (3 - 2025-26 season) 03/16/2024   Influenza Vaccine  10/13/2024 (Originally 02/14/2024)   Pneumococcal Vaccine: 50+ Years (2 of 2 - PCV20 or PCV21) 05/14/2025 (Originally 04/28/2021)   DTaP/Tdap/Td (2 - Td or Tdap) 09/06/2024   Colonoscopy  06/29/2030   Bone Density Scan  Completed   Hepatitis C Screening  Completed   Zoster Vaccines- Shingrix   Completed   Meningococcal B Vaccine  Aged Out     ----------------------------------------------------------------------------------------------------------------------------------------------------------------------------------------------------------------- Physical Exam BP 128/85 (BP Location: Left Arm, Patient Position: Sitting, Cuff Size: Normal)   Pulse 100   Temp 98.3 F (36.8 C) (Oral)   Resp 18   Ht 5' 6 (1.676 m)   Wt 162 lb 0.6 oz (73.5 kg)   LMP 09/11/2012   SpO2 96%   BMI 26.15 kg/m   Physical Exam Constitutional:      Appearance: Normal appearance.  HENT:     Head: Normocephalic and atraumatic.  Eyes:     General: No scleral icterus. Cardiovascular:     Rate and Rhythm: Normal rate and regular rhythm.  Pulmonary:     Effort: Pulmonary effort is normal.     Breath sounds: Normal breath sounds.  Musculoskeletal:     Cervical back: Neck supple.  Neurological:     Mental Status: She is alert.  Psychiatric:        Mood and Affect: Mood normal.        Behavior: Behavior normal.     ------------------------------------------------------------------------------------------------------------------------------------------------------------------------------------------------------------------- Assessment and Plan  Influenza A Point-of-care testing positive for influenza A.  She is out of the window for Tamiflu  to really be effective and it  seems like her symptoms are improving some.  Encouraged increase fluid intake.  Continue supportive care at home.  Red flags reviewed.   No orders of the defined types were placed in this encounter.   No follow-ups on file.         [  1]  Allergies Allergen Reactions   Ciprofloxacin  Hives, Itching and Other (See Comments)    Patient states she had muscle spasms    Hydromorphone  Hcl Itching   Nitrofurantoin  Hives, Rash and Other (See Comments)    Significant transaminitis    Tizanidine Other (See Comments)    Severe hypotension.   Gluten Meal Other (See Comments)   Tramadol  Other (See Comments)    Altered mental status   Melatonin Other (See Comments)    Sleep apnea Sleep apnea

## 2024-07-01 NOTE — Assessment & Plan Note (Signed)
 Point-of-care testing positive for influenza A.  She is out of the window for Tamiflu  to really be effective and it seems like her symptoms are improving some.  Encouraged increase fluid intake.  Continue supportive care at home.  Red flags reviewed.

## 2024-07-01 NOTE — Patient Instructions (Signed)

## 2024-07-07 ENCOUNTER — Other Ambulatory Visit: Payer: Self-pay

## 2024-07-07 DIAGNOSIS — E034 Atrophy of thyroid (acquired): Secondary | ICD-10-CM

## 2024-07-07 NOTE — Telephone Encounter (Signed)
 Copied from CRM #[id number]. Topic: Clinical - Prescription Issue >> Jul 07, 2024 11:32 AM Charlet HERO wrote: Reason for CRM: patien husband is calling about med levothyroxine  (SYNTHROID ) 112 MCG tablet he is stating that the pharm is not refilling due to manufacture changing and that they have been calling to get asst from office but have not been successful, he is also stating that he is concerned bc the pregabalin (LYRICA) 100 MG capsul is running out he needs to have someone call back [phone]. also can call  [facility] on [address]

## 2024-07-07 NOTE — Telephone Encounter (Signed)
 The pharmacy needs us  to send a new prescription of the levothyroxine  stating it is ok for manufacturer change.    I spoke with the pharmacy about the Lyrica. Patient picked up the prescription 2 days ago. It is written by another provider.    Pended prescription with message attached.

## 2024-07-08 MED ORDER — LEVOTHYROXINE SODIUM 112 MCG PO TABS: 112.0000 ug | ORAL_TABLET | Freq: Every day | ORAL | 1 refills | Status: AC

## 2024-07-10 ENCOUNTER — Other Ambulatory Visit: Payer: Self-pay | Admitting: Urgent Care

## 2024-07-10 DIAGNOSIS — F3341 Major depressive disorder, recurrent, in partial remission: Secondary | ICD-10-CM

## 2024-07-18 ENCOUNTER — Other Ambulatory Visit: Payer: Self-pay | Admitting: Urgent Care

## 2024-07-18 DIAGNOSIS — E559 Vitamin D deficiency, unspecified: Secondary | ICD-10-CM

## 2024-07-22 ENCOUNTER — Ambulatory Visit: Payer: Self-pay | Admitting: Family Medicine

## 2024-07-30 ENCOUNTER — Encounter: Payer: Self-pay | Admitting: Student in an Organized Health Care Education/Training Program

## 2024-07-30 ENCOUNTER — Ambulatory Visit
Attending: Student in an Organized Health Care Education/Training Program | Admitting: Student in an Organized Health Care Education/Training Program

## 2024-07-30 VITALS — BP 155/82 | HR 99 | Temp 98.2°F | Resp 16

## 2024-07-30 DIAGNOSIS — M5430 Sciatica, unspecified side: Secondary | ICD-10-CM | POA: Diagnosis present

## 2024-07-30 DIAGNOSIS — G894 Chronic pain syndrome: Secondary | ICD-10-CM | POA: Insufficient documentation

## 2024-07-30 DIAGNOSIS — M79605 Pain in left leg: Secondary | ICD-10-CM | POA: Insufficient documentation

## 2024-07-30 NOTE — Progress Notes (Signed)
 Safety precautions to be maintained throughout the outpatient stay will include: orient to surroundings, keep bed in low position, maintain call bell within reach at all times, provide assistance with transfer out of bed and ambulation.

## 2024-07-30 NOTE — Progress Notes (Signed)
 PROVIDER NOTE: Interpretation of information contained herein should be left to medically-trained personnel. Specific patient instructions are provided elsewhere under Patient Instructions section of medical record. This document was created in part using AI and STT-dictation technology, any transcriptional errors that may result from this process are unintentional.  Patient: Nancy Beard  Service: E/Nancy   PCP: Lowella Benton CROME, PA  DOB: 04/28/1956  DOS: 07/30/2024  Provider: Wallie Sherry, MD  MRN: [id number]  Delivery: Face-to-face  Specialty: Interventional Pain Management  Type: Established Patient  Setting: Ambulatory outpatient facility  Specialty designation: 09  Referring Prov.: Lowella Benton CROME, PA  Location: Outpatient office facility       History of present illness (HPI) [name], a 69 y.o. year old female, is here today because of her Left leg pain [M79.605]. [name] primary complain today is Leg Pain (Left ) and Back Pain (Left lumbar just above hip where spinal cord stimulator is implanted.)  Pain Assessment: Severity of Chronic pain is reported as a 9 /10. Location: Leg Left/up and down the leg and is now into the bottom of her foot which is new. Onset: More than a month ago. Quality: Discomfort, Sharp, Shooting. Timing: Intermittent. Modifying factor(s): changing positions, bowel movement. Vitals:  temporal temperature is 98.2 F (36.8 C). Her blood pressure is 155/82 (abnormal) and her pulse is 99. Her respiration is 16 and oxygen saturation is 96%.  BMI: Estimated body mass index is 26.15 kg/Nancy as calculated from the following:   Height as of 07/01/24: 5' 6 (1.676 Nancy).   Weight as of 07/01/24: 162 lb 0.6 oz (73.5 kg).  Last encounter: 05/13/2024. Last procedure: 05/25/2024.  Reason for encounter:   Post-Procedure Evaluation    Procedure:          Anesthesia, Analgesia, Anxiolysis:  Type: Left sciatic nerve block superior to popliteal crease Primary  Purpose: Diagnostic Approach: Posterior approach Laterality: Left  Anesthesia: Local (1-2% Lidocaine )  Anxiolysis: Oral Valium  5 mg PO  Sedation: None  Guidance: Ultrasound           Position: Prone    1. Left leg pain   2. Sciatic leg pain   3. Chronic pain syndrome    NAS-11 Pain score:   Pre-procedure: 9 /10   Post-procedure: 3 /10   50%  pain relief on DOS    History of Present Illness   Nancy Beard is a 70 year old female who presents with persistent pain in the left foot and leg.  She experiences significant pain, particularly on the bottom of her left foot, described as a sensation of 'somebody's taking a comb and scraping' the area. The pain is persistent and has changed in nature, now starting at the site of her spinal cord stimulator and radiating downwards, sometimes returning upwards. A recent injection provided temporary relief for about three to four hours after the numbing effect wore off.  She is currently using a spinal cord stimulator with two levels, adjusting it to the highest level when the pain is severe, which helps in breaking the pain signal. After some time, she can lower the level or stop it altogether, which often results in the pain subsiding or improving. She is not on any blood thinners, only taking Tylenol  as needed. She has previously undergone a psych evaluation for her spinal cord stimulator, which was done at [facility].  During the review of symptoms, she confirms that the nerve block covered her ankle and foot. She  also mentions that the pain often starts below the nerve and intensifies, feeling as though it 'grows and grows and grows' and becomes worse.         ROS  Constitutional: Denies any fever or chills Gastrointestinal: No reported hemesis, hematochezia, vomiting, or acute GI distress Musculoskeletal: Denies any acute onset joint swelling, redness, loss of ROM, or weakness Neurological: left leg pain  Medication Review   AMBULATORY NON FORMULARY MEDICATION, Cholecalciferol, Vitamin D  (Ergocalciferol ), acetaminophen , celecoxib, estradiol , levothyroxine , oxycodone , pregabalin, prochlorperazine , solifenacin , trimethoprim , and venlafaxine  XR  History Review  Allergy: [name] is allergic to ciprofloxacin , hydromorphone  hcl, nitrofurantoin , tizanidine, gluten meal, tramadol , and melatonin. Drug: [name]  reports no history of drug use. Alcohol :  reports no history of alcohol  use. Tobacco:  reports that she has never smoked. She has never used smokeless tobacco. Social: [name]  reports that she has never smoked. She has never used smokeless tobacco. She reports that she does not drink alcohol  and does not use drugs. Medical:  has a past medical history of Anxiety, Blood transfusion without reported diagnosis (2014), Breast cancer (HCC) (2014), Celiac disease, Depression, DIVERTICULOSIS, COLON (05/05/2007), DVT (deep venous thrombosis) (HCC) (2014), DVT (deep venous thrombosis) (HCC) (06/30/2013), Fracture of left tibial plateau (06/15/2015), Fracture tibia/fibula (06/14/2015), HEMORRHOIDS, EXTERNAL (05/05/2007), History of chemotherapy, Hypothyroidism, Incontinence, Neuropathy, Pneumonia, Thyroid  disease, Vitamin D  insufficiency (06/20/2015), and Wears glasses. Surgical: Nancy Beard  has a past surgical history that includes Esophagogastroduodenoscopy (2007); cryoblation of cervix; Breast lumpectomy; Total mastectomy (Bilateral, 09/11/2012); Axillary sentinel node biopsy (Right, 09/11/2012); Breast reconstruction with placement of tissue expander and flex hd (acellular hydrated dermis) (Bilateral, 09/11/2012); Incision and drainage of wound (Left, 09/16/2012); Portacath placement (Right, 10/09/2012); Removal of bilateral tissue expanders with placement of bilateral breast implants (Bilateral, 06/24/2013); Vulvectomy (N/A, 07/14/2013); Port-a-cath removal (Right, 08/07/2013); Breast reconstruction (Right, 09/24/2013);  Liposuction with lipofilling (Bilateral, 09/24/2013); External fixation leg (Left, 06/15/2015); ORIF tibia plateau (Left, 06/21/2015); Liposuction with lipofilling (Bilateral, 06/08/2020); and Colonoscopy (12/20/2004). Family: family history includes Breast cancer in her paternal aunt, paternal grandmother, and another family member; COPD in her father; Heart attack in her paternal grandfather; Lymphoma in her paternal uncle; Osteoporosis in her mother.  Laboratory Chemistry Profile   Renal Lab Results  Component Value Date   BUN 12 04/23/2024   CREATININE 0.77 04/23/2024   BCR 16 04/23/2024   GFR 111.43 07/18/2012   GFRAA 108 03/23/2020   GFRNONAA 93 03/23/2020    Hepatic Lab Results  Component Value Date   AST 32 04/23/2024   ALT 38 (H) 04/23/2024   ALBUMIN  4.4 04/23/2024   ALKPHOS 142 (H) 04/23/2024    Electrolytes Lab Results  Component Value Date   NA 140 04/23/2024   K 4.7 04/23/2024   CL 102 04/23/2024   CALCIUM  9.2 04/23/2024   MG 2.2 05/21/2022   PHOS 3.2 06/15/2015    Bone Lab Results  Component Value Date   VD25OH 16.7 (L) 04/23/2024   [id number] 47 04/18/2021   [id number] 47 04/18/2021   [id number] <8 04/18/2021    Inflammation (CRP: Acute Phase) (ESR: Chronic Phase) No results found for: CRP, ESRSEDRATE, LATICACIDVEN       Note: Above Lab results reviewed.  Recent Imaging Review  DG Chest 2 View EXAM: 2 VIEW(S) XRAY OF THE CHEST 07/01/2024 11:08:00 AM  COMPARISON: 04/17/2017  CLINICAL HISTORY: cough, chest tightness  FINDINGS:  LUNGS AND PLEURA: No focal pulmonary opacity. No pleural effusion. No pneumothorax.  HEART AND MEDIASTINUM: No acute abnormality of  the cardiac and mediastinal silhouettes.  BONES AND SOFT TISSUES: Partially visualized spinal fusion hardware extending from T11 to the lumbar spine for fixation of L1 compression fracture which is better evaluated on prior MRI. Spinal stimulator lead noted extending over the mid  and lower thoracic spine with tip at approximately the T11 level.  IMPRESSION: 1. No acute findings.  Electronically signed by: Donnice Mania MD 07/12/2024 09:55 PM EST RP Workstation: [id number]   NCV & EMG Findings: Extensive electrodiagnostic evaluation of the left lower limb shows: Left sural and superficial peroneal/fibular sensory responses are within normal limits. Left peroneal/fibular (EDB) motor nerve shows evidence of a normal anatomic variant, an accessory peroneal. When accounting for this, the left peroneal/fibular (EDB) motor response is within normal limits. Left tibial (AH) motor response is within normal limits. Left H reflex is absent. Chronic motor axon loss changes WITH accompanying active denervation changes are seen in the left medial head of gastrocnemius and short head of biceps femoris muscles. Chronic motor axon loss changes WITHOUT accompanying active denervation changes are seen in the left tibialis anterior, flexor digitorum longus, and gluteus medius muscles. Lumbosacral paraspinal muscles were deferred due to prior lumbosacral spine surgery.   Impression: This is an abnormal study. The findings are most consistent with the following: An active/ongoing intraspinal canal lesion (ie: motor radiculopathy) at the left S1 root or segment, moderate in degree electrically. The residuals of an old intraspinal canal lesion (ie: motor radiculopathy) at the left L5 root or segment, mild in degree electrically. No electrodiagnostic evidence of a large fiber sensorimotor neuropathy. No electrodiagnostic evidence of left tibial, peroneal/fibular, or sciatic mononeuropathy. Complexity Note: results reviewed    Lumbar MR wo contrast: Results for orders placed in visit on 12/18/20   MR LUMBAR SPINE WO CONTRAST   Narrative CLINICAL DATA:  Neck pain. Increasing left leg pain. History of L1 burst fracture February 2022. Status post thoracolumbar stabilization/fusion.    EXAM: MRI LUMBAR SPINE WITHOUT CONTRAST   TECHNIQUE: Multiplanar, multisequence MR imaging of the lumbar spine was performed. No intravenous contrast was administered.   COMPARISON:  None.   FINDINGS: Segmentation: There are five lumbar type vertebral bodies. The last full intervertebral disc space is labeled L5-S1.   Alignment:  The overall alignment is maintained.   Vertebrae: Evidence of a significant burst fracture at L1 with retropulsion and canal narrowing. 30 to 40% canal encroachment. Recommend correlation with any prior studies. Persistent signal abnormality suggesting a subacute process. There are pedicle screws and posterior rods above and below the L1 vertebral body. No complicating features are identified.   Conus medullaris and cauda equina: Conus extends to the L1-2 level. Conus and cauda equina appear normal.   Paraspinal and other soft tissues: No significant paraspinal findings.   Disc levels:   T12-L1: Pedicle screws in the T12 vertebral body. No complicating features. No significant spinal or foraminal stenosis.   L1-2: Burst fracture of L1 as detailed above. No significant spinal stenosis at the disc space level.   L2-3: Pedicle screws in L2 and L3.  The disc spaces normal.   L3-4, L4-5 and L5-S1 are unremarkable. No disc protrusions, spinal or foraminal stenosis. 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   IMPRESSION: 1. Burst fracture of L1 with retropulsion and canal narrowing (estimated at 30-40%). Recommend correlation with any prior studies. 2. Thoracolumbar fusion hardware without complicating features. 3. No disc protrusions or foraminal stenosis.     Electronically Signed By: MYRTIS Stammer Nancy.D. On: 12/18/2020 15:53  Note: Reviewed        Physical Exam  Vitals: BP (!) 155/82 (BP Location: Left Arm, Patient Position: Sitting, Cuff Size: Normal)   Pulse 99   Temp 98.2 F (36.8 C) (Temporal)   Resp 16   LMP 09/11/2012   SpO2 96%  BMI:  Estimated body mass index is 26.15 kg/Nancy as calculated from the following:   Height as of 07/01/24: 5' 6 (1.676 Nancy).   Weight as of 07/01/24: 162 lb 0.6 oz (73.5 kg). Ideal: Patient weight not recorded General appearance: Well nourished, well developed, and well hydrated. In no apparent acute distress Mental status: Alert, oriented x 3 (person, place, & time)       Respiratory: No evidence of acute respiratory distress Eyes: PERLA Lumbar Spine Area Exam  Skin & Axial Inspection: Well healed scar from previous spine surgery detected Alignment: Symmetrical Functional ROM: Unrestricted ROM       Stability: No instability detected Muscle Tone/Strength: Functionally intact. No obvious neuro-muscular anomalies detected. Sensory (Neurological): Neurogenic pain pattern   Gait & Posture Assessment  Ambulation: Unassisted Gait: Relatively normal for age and body habitus Posture: WNL  Lower Extremity Exam      Side: Right lower extremity   Side: Left lower extremity  Stability: No instability observed           Stability: No instability observed          Skin & Extremity Inspection: Skin color, temperature, and hair growth are WNL. No peripheral edema or cyanosis. No masses, redness, swelling, asymmetry, or associated skin lesions. No contractures.   Skin & Extremity Inspection: Skin color, temperature, and hair growth are WNL. No peripheral edema or cyanosis. No masses, redness, swelling, asymmetry, or associated skin lesions. No contractures.  Functional ROM: Unrestricted ROM                   Functional ROM: Pain restricted ROM for all joints of the lower extremity          Muscle Tone/Strength: Functionally intact. No obvious neuro-muscular anomalies detected.   Muscle Tone/Strength: Functionally intact. No obvious neuro-muscular anomalies detected.  Sensory (Neurological): Unimpaired         Sensory (Neurological): Neurogenic pain pattern        DTR: Patellar: deferred today Achilles:  deferred today Plantar: deferred today   DTR: Patellar: deferred today Achilles: deferred today Plantar: deferred today  Palpation: No palpable anomalies   Palpation: No palpable anomalies     Assessment   Diagnosis  1. Left leg pain   2. Sciatic leg pain   3. Chronic pain syndrome      Updated Problems: No problems updated.  Plan of Care  Problem-specific:  Assessment and Plan    Chronic pain syndrome with left-sided sciatica-discussed left popliteal sciatic peripheral nerve stimulation with Sprint PNS given positive left popliteal sciatic nerve block I explained to pt how Sprint peripheral nerve stimulation (PNS)  is typically considered for patients with chronic, localized pain that is not responding to conservative treatments such as medications, physical therapy, or injections.  The SPRINT peripheral nerve stimulator is designed for short-term, percutaneous use (approximately 60 days) to modulate pain through targeted nerve stimulation. Unlike traditional permanent implants, SPRINT is temporary but can lead to long-lasting pain relief by altering pain signals.  We discussed the risks and benefits of peripheral nerve stimulation. Benefits: minimally invasive, does not require permanent implantation, can offer significant pain relief, improving function and quality of life, may reduce  the need for long-term opioid use. The risks/challenges include (but not limited to):  infection or irritation at the stimulation site, discomfort from electrode placement, risk of incomplete pain relief or temporary relief post-removal, limited to short-term therapy, which may be a disadvantage in chronic, refractory cases         [name] has a current medication list which includes the following long-term medication(s): levothyroxine , pregabalin, prochlorperazine , and venlafaxine  xr.  Pharmacotherapy (Medications Ordered): No orders of the defined types were placed in this  encounter.  Orders:  Orders Placed This Encounter  Procedures   Implantable Peripheral Nerve Stimulator    Standing Status:   Future    Expiration Date:   10/28/2024    Scheduling Instructions:     Procedure: Temporary implantable peripheral nerve stimulator     Side:  Left popliteal sciatic      Nerve site: Left popliteal sciatic     Sedation: PO Valium      Timeframe: ASAA    Location of Procedure:   [facility]     Left popliteal sciatic nerve block 05/25/2024: Consider Sprint peripheral nerve stimulation   Return in about 2 weeks (around 08/13/2024) for Left popliteal-sciatic PNS , in clinic (PO Valium  5mg ).    Recent Visits Date Type Provider Dept  05/25/24 Procedure visit Marcelino Nurse, MD [facility]  05/13/24 Office Visit Marcelino Nurse, MD [facility]  Showing recent visits within past 90 days and meeting all other requirements Today's Visits Date Type Provider Dept  07/30/24 Office Visit Marcelino Nurse, MD [facility]  Showing today's visits and meeting all other requirements Future Appointments No visits were found meeting these conditions. Showing future appointments within next 90 days and meeting all other requirements  I discussed the assessment and treatment plan with the patient. The patient was provided an opportunity to ask questions and all were answered. The patient agreed with the plan and demonstrated an understanding of the instructions.  Patient advised to call back or seek an in-person evaluation if the symptoms or condition worsens.  I personally spent a total of 30 minutes in the care of the patient today including preparing to see the patient, getting/reviewing separately obtained history, performing a medically appropriate exam/evaluation, counseling and educating, placing orders, and documenting clinical information in the EHR.   Note by: Nurse Marcelino, MD (TTS and AI technology used. I apologize for any  typographical errors that were not detected and corrected.) Date: 07/30/2024; Time: 11:56 AM

## 2024-07-30 NOTE — Patient Instructions (Signed)
 Sprint info given

## 2024-08-02 ENCOUNTER — Encounter: Payer: Self-pay | Admitting: Student in an Organized Health Care Education/Training Program

## 2024-08-03 ENCOUNTER — Other Ambulatory Visit: Payer: Self-pay | Admitting: Urology

## 2024-08-03 DIAGNOSIS — N390 Urinary tract infection, site not specified: Secondary | ICD-10-CM

## 2024-08-03 MED ORDER — METHENAMINE HIPPURATE 1 G PO TABS: 1.0000 g | ORAL_TABLET | Freq: Two times a day (BID) | ORAL | 3 refills | Status: AC

## 2024-08-17 ENCOUNTER — Ambulatory Visit: Admitting: Student in an Organized Health Care Education/Training Program

## 2024-09-09 ENCOUNTER — Ambulatory Visit: Admitting: Student in an Organized Health Care Education/Training Program

## 2024-11-12 ENCOUNTER — Ambulatory Visit: Admitting: Urgent Care
